# Patient Record
Sex: Female | Born: 1950 | Race: White | Hispanic: No | Marital: Married | Smoking: Never smoker
Health system: Southern US, Community
[De-identification: ages and names within clinical notes are randomized; demographics above are authoritative.]

## PROBLEM LIST (undated history)

## (undated) DIAGNOSIS — L819 Disorder of pigmentation, unspecified: Secondary | ICD-10-CM

## (undated) DIAGNOSIS — I499 Cardiac arrhythmia, unspecified: Secondary | ICD-10-CM

## (undated) DIAGNOSIS — Z807 Family history of other malignant neoplasms of lymphoid, hematopoietic and related tissues: Secondary | ICD-10-CM

## (undated) DIAGNOSIS — F419 Anxiety disorder, unspecified: Secondary | ICD-10-CM

## (undated) DIAGNOSIS — Z803 Family history of malignant neoplasm of breast: Secondary | ICD-10-CM

## (undated) DIAGNOSIS — Z808 Family history of malignant neoplasm of other organs or systems: Secondary | ICD-10-CM

## (undated) DIAGNOSIS — Z801 Family history of malignant neoplasm of trachea, bronchus and lung: Secondary | ICD-10-CM

## (undated) DIAGNOSIS — K219 Gastro-esophageal reflux disease without esophagitis: Secondary | ICD-10-CM

## (undated) DIAGNOSIS — E039 Hypothyroidism, unspecified: Secondary | ICD-10-CM

## (undated) DIAGNOSIS — M1990 Unspecified osteoarthritis, unspecified site: Secondary | ICD-10-CM

## (undated) DIAGNOSIS — C801 Malignant (primary) neoplasm, unspecified: Secondary | ICD-10-CM

## (undated) DIAGNOSIS — R05 Cough: Secondary | ICD-10-CM

## (undated) DIAGNOSIS — C50919 Malignant neoplasm of unspecified site of unspecified female breast: Secondary | ICD-10-CM

## (undated) DIAGNOSIS — I10 Essential (primary) hypertension: Secondary | ICD-10-CM

## (undated) DIAGNOSIS — Z923 Personal history of irradiation: Secondary | ICD-10-CM

## (undated) HISTORY — DX: Disorder of pigmentation, unspecified: L81.9

## (undated) HISTORY — DX: Gastro-esophageal reflux disease without esophagitis: K21.9

## (undated) HISTORY — DX: Family history of malignant neoplasm of breast: Z80.3

## (undated) HISTORY — DX: Unspecified osteoarthritis, unspecified site: M19.90

## (undated) HISTORY — PX: COLONOSCOPY: SHX174

## (undated) HISTORY — DX: Family history of malignant neoplasm of other organs or systems: Z80.8

## (undated) HISTORY — DX: Hypothyroidism, unspecified: E03.9

## (undated) HISTORY — DX: Family history of malignant neoplasm of trachea, bronchus and lung: Z80.1

## (undated) HISTORY — PX: BREAST EXCISIONAL BIOPSY: SUR124

## (undated) HISTORY — DX: Cough: R05

## (undated) HISTORY — DX: Family history of other malignant neoplasms of lymphoid, hematopoietic and related tissues: Z80.7

## (undated) HISTORY — DX: Essential (primary) hypertension: I10

## (undated) HISTORY — PX: CHOLECYSTECTOMY: SHX55

---

## 1998-10-17 ENCOUNTER — Ambulatory Visit (HOSPITAL_COMMUNITY): Admission: RE | Admit: 1998-10-17 | Discharge: 1998-10-17 | Payer: Self-pay | Admitting: *Deleted

## 1998-10-17 ENCOUNTER — Encounter: Payer: Self-pay | Admitting: *Deleted

## 1998-11-26 ENCOUNTER — Other Ambulatory Visit: Admission: RE | Admit: 1998-11-26 | Discharge: 1998-11-26 | Payer: Self-pay | Admitting: *Deleted

## 1999-12-10 ENCOUNTER — Encounter: Payer: Self-pay | Admitting: *Deleted

## 1999-12-10 ENCOUNTER — Ambulatory Visit (HOSPITAL_COMMUNITY): Admission: RE | Admit: 1999-12-10 | Discharge: 1999-12-10 | Payer: Self-pay | Admitting: *Deleted

## 2001-02-04 ENCOUNTER — Other Ambulatory Visit: Admission: RE | Admit: 2001-02-04 | Discharge: 2001-02-04 | Payer: Self-pay | Admitting: *Deleted

## 2001-07-22 ENCOUNTER — Ambulatory Visit (HOSPITAL_COMMUNITY): Admission: RE | Admit: 2001-07-22 | Discharge: 2001-07-22 | Payer: Self-pay | Admitting: *Deleted

## 2001-07-22 ENCOUNTER — Encounter: Payer: Self-pay | Admitting: *Deleted

## 2002-09-27 ENCOUNTER — Other Ambulatory Visit: Admission: RE | Admit: 2002-09-27 | Discharge: 2002-09-27 | Payer: Self-pay | Admitting: Obstetrics and Gynecology

## 2003-12-01 ENCOUNTER — Ambulatory Visit (HOSPITAL_COMMUNITY): Admission: RE | Admit: 2003-12-01 | Discharge: 2003-12-01 | Payer: Self-pay | Admitting: Family Medicine

## 2005-01-02 ENCOUNTER — Ambulatory Visit (HOSPITAL_COMMUNITY): Admission: RE | Admit: 2005-01-02 | Discharge: 2005-01-02 | Payer: Self-pay | Admitting: Obstetrics & Gynecology

## 2006-02-12 ENCOUNTER — Ambulatory Visit (HOSPITAL_COMMUNITY): Admission: RE | Admit: 2006-02-12 | Discharge: 2006-02-12 | Payer: Self-pay | Admitting: Family Medicine

## 2006-11-24 HISTORY — PX: JOINT REPLACEMENT: SHX530

## 2007-03-08 ENCOUNTER — Ambulatory Visit (HOSPITAL_COMMUNITY): Admission: RE | Admit: 2007-03-08 | Discharge: 2007-03-08 | Payer: Self-pay | Admitting: Family Medicine

## 2007-03-12 ENCOUNTER — Encounter: Admission: RE | Admit: 2007-03-12 | Discharge: 2007-03-12 | Payer: Self-pay | Admitting: Family Medicine

## 2007-08-26 ENCOUNTER — Ambulatory Visit (HOSPITAL_COMMUNITY): Admission: RE | Admit: 2007-08-26 | Discharge: 2007-08-26 | Payer: Self-pay | Admitting: Family Medicine

## 2007-09-27 ENCOUNTER — Inpatient Hospital Stay (HOSPITAL_COMMUNITY): Admission: RE | Admit: 2007-09-27 | Discharge: 2007-09-30 | Payer: Self-pay | Admitting: Orthopedic Surgery

## 2007-11-25 DIAGNOSIS — Z923 Personal history of irradiation: Secondary | ICD-10-CM

## 2007-11-25 DIAGNOSIS — C50919 Malignant neoplasm of unspecified site of unspecified female breast: Secondary | ICD-10-CM

## 2007-11-25 HISTORY — PX: BREAST LUMPECTOMY: SHX2

## 2007-11-25 HISTORY — PX: HERNIA REPAIR: SHX51

## 2007-11-25 HISTORY — PX: BREAST SURGERY: SHX581

## 2007-11-25 HISTORY — DX: Malignant neoplasm of unspecified site of unspecified female breast: C50.919

## 2007-11-25 HISTORY — DX: Personal history of irradiation: Z92.3

## 2008-03-31 ENCOUNTER — Ambulatory Visit (HOSPITAL_COMMUNITY): Admission: RE | Admit: 2008-03-31 | Discharge: 2008-03-31 | Payer: Self-pay | Admitting: Family Medicine

## 2008-04-11 ENCOUNTER — Encounter: Admission: RE | Admit: 2008-04-11 | Discharge: 2008-04-11 | Payer: Self-pay | Admitting: Family Medicine

## 2008-04-13 ENCOUNTER — Encounter (INDEPENDENT_AMBULATORY_CARE_PROVIDER_SITE_OTHER): Payer: Self-pay | Admitting: Diagnostic Radiology

## 2008-04-13 ENCOUNTER — Encounter: Admission: RE | Admit: 2008-04-13 | Discharge: 2008-04-13 | Payer: Self-pay | Admitting: Family Medicine

## 2008-04-24 ENCOUNTER — Encounter: Admission: RE | Admit: 2008-04-24 | Discharge: 2008-04-24 | Payer: Self-pay | Admitting: Family Medicine

## 2008-05-15 ENCOUNTER — Encounter (INDEPENDENT_AMBULATORY_CARE_PROVIDER_SITE_OTHER): Payer: Self-pay | Admitting: Surgery

## 2008-05-15 ENCOUNTER — Encounter: Admission: RE | Admit: 2008-05-15 | Discharge: 2008-05-15 | Payer: Self-pay | Admitting: Surgery

## 2008-05-15 ENCOUNTER — Ambulatory Visit (HOSPITAL_BASED_OUTPATIENT_CLINIC_OR_DEPARTMENT_OTHER): Admission: RE | Admit: 2008-05-15 | Discharge: 2008-05-15 | Payer: Self-pay | Admitting: Surgery

## 2008-05-17 ENCOUNTER — Ambulatory Visit: Payer: Self-pay | Admitting: Oncology

## 2008-05-24 ENCOUNTER — Ambulatory Visit: Admission: RE | Admit: 2008-05-24 | Discharge: 2008-07-30 | Payer: Self-pay | Admitting: Radiation Oncology

## 2008-06-12 ENCOUNTER — Encounter: Admission: RE | Admit: 2008-06-12 | Discharge: 2008-06-12 | Payer: Self-pay | Admitting: Radiation Oncology

## 2008-09-26 ENCOUNTER — Ambulatory Visit: Payer: Self-pay | Admitting: Oncology

## 2008-09-29 LAB — FOLLICLE STIMULATING HORMONE: FSH: 41.1 m[IU]/mL

## 2008-10-07 LAB — ESTRADIOL, ULTRA SENS

## 2008-11-07 ENCOUNTER — Ambulatory Visit (HOSPITAL_COMMUNITY): Admission: RE | Admit: 2008-11-07 | Discharge: 2008-11-08 | Payer: Self-pay | Admitting: General Surgery

## 2009-01-09 ENCOUNTER — Encounter: Payer: Self-pay | Admitting: Family Medicine

## 2009-04-16 ENCOUNTER — Ambulatory Visit: Payer: Self-pay | Admitting: Family Medicine

## 2009-04-16 DIAGNOSIS — E039 Hypothyroidism, unspecified: Secondary | ICD-10-CM

## 2009-04-16 DIAGNOSIS — I10 Essential (primary) hypertension: Secondary | ICD-10-CM

## 2009-04-16 DIAGNOSIS — M1990 Unspecified osteoarthritis, unspecified site: Secondary | ICD-10-CM | POA: Insufficient documentation

## 2009-04-16 HISTORY — DX: Unspecified osteoarthritis, unspecified site: M19.90

## 2009-04-16 HISTORY — DX: Hypothyroidism, unspecified: E03.9

## 2009-04-16 HISTORY — DX: Essential (primary) hypertension: I10

## 2009-04-19 ENCOUNTER — Encounter: Admission: RE | Admit: 2009-04-19 | Discharge: 2009-04-19 | Payer: Self-pay | Admitting: Family Medicine

## 2009-05-24 ENCOUNTER — Ambulatory Visit: Payer: Self-pay | Admitting: Family Medicine

## 2009-05-24 LAB — CONVERTED CEMR LAB
ALT: 22 units/L (ref 0–35)
AST: 20 units/L (ref 0–37)
Albumin: 3.6 g/dL (ref 3.5–5.2)
Alkaline Phosphatase: 62 units/L (ref 39–117)
Basophils Absolute: 0 10*3/uL (ref 0.0–0.1)
Basophils Relative: 0.5 % (ref 0.0–3.0)
Bilirubin Urine: NEGATIVE
Bilirubin, Direct: 0.1 mg/dL (ref 0.0–0.3)
CO2: 31 meq/L (ref 19–32)
Calcium: 9.1 mg/dL (ref 8.4–10.5)
Cholesterol: 213 mg/dL — ABNORMAL HIGH (ref 0–200)
Creatinine, Ser: 0.7 mg/dL (ref 0.4–1.2)
Direct LDL: 149.4 mg/dL
Eosinophils Absolute: 0.3 10*3/uL (ref 0.0–0.7)
Eosinophils Relative: 6 % — ABNORMAL HIGH (ref 0.0–5.0)
Glucose, Bld: 98 mg/dL (ref 70–99)
Glucose, Urine, Semiquant: NEGATIVE
Ketones, urine, test strip: NEGATIVE
Lymphocytes Relative: 34.5 % (ref 12.0–46.0)
Lymphs Abs: 1.5 10*3/uL (ref 0.7–4.0)
Monocytes Absolute: 0.4 10*3/uL (ref 0.1–1.0)
Neutro Abs: 2.1 10*3/uL (ref 1.4–7.7)
Nitrite: NEGATIVE
Total CHOL/HDL Ratio: 5
Urobilinogen, UA: 0.2
VLDL: 21.6 mg/dL (ref 0.0–40.0)
WBC Urine, dipstick: NEGATIVE

## 2009-05-31 ENCOUNTER — Ambulatory Visit: Payer: Self-pay | Admitting: Family Medicine

## 2009-08-20 ENCOUNTER — Encounter: Payer: Self-pay | Admitting: Family Medicine

## 2009-08-23 ENCOUNTER — Ambulatory Visit: Payer: Self-pay | Admitting: Oncology

## 2009-08-27 ENCOUNTER — Encounter (INDEPENDENT_AMBULATORY_CARE_PROVIDER_SITE_OTHER): Payer: Self-pay | Admitting: *Deleted

## 2010-04-30 ENCOUNTER — Encounter: Admission: RE | Admit: 2010-04-30 | Discharge: 2010-04-30 | Payer: Self-pay | Admitting: Family Medicine

## 2010-05-10 ENCOUNTER — Ambulatory Visit: Payer: Self-pay | Admitting: Family Medicine

## 2010-05-10 DIAGNOSIS — R05 Cough: Secondary | ICD-10-CM

## 2010-05-10 DIAGNOSIS — R059 Cough, unspecified: Secondary | ICD-10-CM | POA: Insufficient documentation

## 2010-05-10 HISTORY — DX: Cough, unspecified: R05.9

## 2010-05-24 ENCOUNTER — Ambulatory Visit: Payer: Self-pay | Admitting: Family Medicine

## 2010-05-24 LAB — CONVERTED CEMR LAB
AST: 19 units/L (ref 0–37)
Albumin: 4 g/dL (ref 3.5–5.2)
BUN: 13 mg/dL (ref 6–23)
Basophils Relative: 0.7 % (ref 0.0–3.0)
Bilirubin, Direct: 0.1 mg/dL (ref 0.0–0.3)
Cholesterol: 230 mg/dL — ABNORMAL HIGH (ref 0–200)
Direct LDL: 147.4 mg/dL
Glucose, Bld: 86 mg/dL (ref 70–99)
HCT: 39.5 % (ref 36.0–46.0)
HDL: 51.2 mg/dL (ref >39.00)
Lymphocytes Relative: 38.5 % (ref 12.0–46.0)
Lymphs Abs: 1.5 10*3/uL (ref 0.7–4.0)
Monocytes Absolute: 0.4 10*3/uL (ref 0.1–1.0)
Neutro Abs: 1.9 10*3/uL (ref 1.4–7.7)
Neutrophils Relative %: 47.2 % (ref 43.0–77.0)
RBC: 4.33 M/uL (ref 3.87–5.11)
RDW: 13.4 % (ref 11.5–14.6)
Total Bilirubin: 0.8 mg/dL (ref 0.3–1.2)
Total CHOL/HDL Ratio: 4
Total Protein: 7.2 g/dL (ref 6.0–8.3)
Triglycerides: 145 mg/dL (ref 0.0–149.0)
Urobilinogen, UA: 0.2
VLDL: 29 mg/dL (ref 0.0–40.0)
WBC: 3.9 10*3/uL — ABNORMAL LOW (ref 4.5–10.5)
pH: 6

## 2010-06-07 ENCOUNTER — Ambulatory Visit: Payer: Self-pay | Admitting: Family Medicine

## 2010-06-07 DIAGNOSIS — J029 Acute pharyngitis, unspecified: Secondary | ICD-10-CM | POA: Insufficient documentation

## 2010-06-07 DIAGNOSIS — J020 Streptococcal pharyngitis: Secondary | ICD-10-CM | POA: Insufficient documentation

## 2010-06-10 ENCOUNTER — Ambulatory Visit: Payer: Self-pay | Admitting: Family Medicine

## 2010-06-10 ENCOUNTER — Other Ambulatory Visit: Admission: RE | Admit: 2010-06-10 | Discharge: 2010-06-10 | Payer: Self-pay | Admitting: Family Medicine

## 2010-06-10 DIAGNOSIS — L819 Disorder of pigmentation, unspecified: Secondary | ICD-10-CM

## 2010-06-10 HISTORY — DX: Disorder of pigmentation, unspecified: L81.9

## 2010-06-12 LAB — CONVERTED CEMR LAB: Pap Smear: NEGATIVE

## 2010-06-19 ENCOUNTER — Telehealth: Payer: Self-pay | Admitting: *Deleted

## 2010-07-08 ENCOUNTER — Telehealth: Payer: Self-pay | Admitting: *Deleted

## 2010-07-09 ENCOUNTER — Ambulatory Visit: Payer: Self-pay | Admitting: Family Medicine

## 2010-09-11 ENCOUNTER — Ambulatory Visit: Payer: Self-pay | Admitting: Family Medicine

## 2010-12-15 ENCOUNTER — Encounter: Payer: Self-pay | Admitting: Family Medicine

## 2010-12-24 NOTE — Progress Notes (Signed)
Summary: PA for Retin A denied  Phone Note Outgoing Call   Call placed by: Sid Falcon LPN,  June 19, 2010 4:53 PM Call placed to: Patient Summary of Call: Pt informed BC/BS deined prior authorization for Retin A cream.  She will be mailed the same information.  Pt may contact BC/BS.  She has not had the Rx filled for this med. Initial call taken by: Sid Falcon LPN,  June 19, 2010 5:03 PM    New/Updated Medications: RETIN-A 0.05 % CREA (TRETINOIN) apply to face at bedtime as directed

## 2010-12-24 NOTE — Assessment & Plan Note (Signed)
Summary: FLU SHOT // RS  Nurse Visit   Allergies: 1)  ! Codeine Sulfate (Codeine Sulfate) 2)  ! Erythromycin Base (Erythromycin Base) 3)  Nexium (Esomeprazole Magnesium)  Orders Added: 1)  Admin 1st Vaccine [90471] 2)  Flu Vaccine 64yrs + [47829] Flu Vaccine Consent Questions     Do you have a history of severe allergic reactions to this vaccine? no    Any prior history of allergic reactions to egg and/or gelatin? no    Do you have a sensitivity to the preservative Thimersol? no    Do you have a past history of Guillan-Barre Syndrome? no    Do you currently have an acute febrile illness? no    Have you ever had a severe reaction to latex? no    Vaccine information given and explained to patient? yes    Are you currently pregnant? no    Lot Number:[id number]   Exp Date:05/24/2011   Site Given  Left Deltoid IM

## 2010-12-24 NOTE — Miscellaneous (Signed)
Summary: Waiver of Liability Form for Shingles Vaccine  Waiver of Liability Form for Shingles Vaccine   Imported By: Maryln Gottron 07/11/2010 10:53:29  _____________________________________________________________________  External Attachment:    Type:   Image     Comment:   External Document

## 2010-12-24 NOTE — Progress Notes (Signed)
Summary: Zostavax offered  Phone Note Outgoing Call   Call placed by: Sid Falcon LPN,  July 08, 2010 9:04 AM Call placed to: OPatient Summary of Call: Offered Zostavax, pt would like to have one, will schedule Initial call taken by: Sid Falcon LPN,  July 08, 2010 9:12 AM

## 2010-12-24 NOTE — Assessment & Plan Note (Signed)
Summary: SORE THROAT // RS   Vital Signs:  Patient profile:   60 year old female Height:      63.75 inches Weight:      168 pounds BMI:     29.17 Temp:     98.2 degrees F oral BP sitting:   160 / 88  (left arm) Cuff size:   regular  Vitals Entered By: Kern Reap CMA Duncan Dull) (June 07, 2010 11:51 AM) CC: sore throat   CC:  sore throat.  History of Present Illness: Theresa Bradley is a 60-year-old female comes in today for evaluation of sore throat x 2 days.  To her family members have had strep.  This week.  She has fever bad sore throat and cough.  Review of systems negative.  She is not allergic to erythromycin.  It causes her to have an upset stomach  Allergies: 1)  ! Codeine Sulfate (Codeine Sulfate) 2)  ! Erythromycin Base (Erythromycin Base)  Past History:  Past medical, surgical, family and social histories (including risk factors) reviewed for relevance to current acute and chronic problems.  Past Medical History: Reviewed history from 04/16/2009 and no changes required. Anxiety Hypothyroidism Osteoarthritis hips Hypertension right breast cancer ductal in situ Jaundice (5th grade) UTI  Past Surgical History: Reviewed history from 04/16/2009 and no changes required. Cholecystectomy breast biopsy X 2  Breast Cancer 2009, stage 0, non invasive (lumpectomy) Left hip replacement 2008 Hernia repair 2009 2 C-Section 1979, 1982  Family History: Reviewed history from 04/16/2009 and no changes required. Family History of Arthritis Family History High cholesterol Breast cancer, relative  Social History: Reviewed history from 04/16/2009 and no changes required. Occupation: Veterinary surgeon Married Never Smoked  Review of Systems      See HPI  Physical Exam  General:  Well-developed,well-nourished,in no acute distress; alert,appropriate and cooperative throughout examination Head:  Normocephalic and atraumatic without obvious abnormalities. No apparent alopecia or  balding. Eyes:  No corneal or conjunctival inflammation noted. EOMI. Perrla. Funduscopic exam benign, without hemorrhages, exudates or papilledema. Vision grossly normal. Ears:  External ear exam shows no significant lesions or deformities.  Otoscopic examination reveals clear canals, tympanic membranes are intact bilaterally without bulging, retraction, inflammation or discharge. Hearing is grossly normal bilaterally. Nose:  External nasal examination shows no deformity or inflammation. Nasal mucosa are pink and moist without lesions or exudates. Mouth:  red pharynx no exudates Neck:  No deformities, masses, or tenderness noted. Lungs:  Normal respiratory effort, chest expands symmetrically. Lungs are clear to auscultation, no crackles or wheezes.   Problems:  Medical Problems Added: 1)  Dx of Strep Throat  (ICD-034.0) 2)  Dx of Sore Throat  (ICD-462)  Impression & Recommendations:  Problem # 1:  STREP THROAT (ICD-034.0) Assessment New  Her updated medication list for this problem includes:    Mobic 15 Mg Tabs (Meloxicam) ..... Once daily    Aspirin Adult Bradley Strength 81 Mg Tbec (Aspirin) ..... Once daily    Amoxicillin 500 Mg Caps (Amoxicillin) .Marland Kitchen... Take 1 tablet by mouth three times a day  Orders: Prescription Created Electronically [phone])  Complete Medication List: 1)  Mobic 15 Mg Tabs (Meloxicam) .... Once daily 2)  Levoxyl 50 Mcg Tabs (Levothyroxine sodium) .... Once daily 3)  Xanax 0.5 Mg Tabs (Alprazolam) .... As needed 4)  Aspirin Adult Bradley Strength 81 Mg Tbec (Aspirin) .... Once daily 5)  Claritin 10 Mg Caps (Loratadine) .... Once daily 6)  Celexa 20 Mg Tabs (Citalopram hydrobromide) .... One by mouth once  daily 7)  [facility] Calcium-vitamin D 500-200 Mg-unit Tabs (Calcium carbonate-vitamin d) .... Once daily 8)  Amoxicillin 500 Mg Caps (Amoxicillin) .... Take 1 tablet by mouth three times a day  Other Orders: Rapid Strep (04540)  Patient Instructions: 1)  begin  amoxicillin, 500 mg 3 times a day x 10 days 2)  Please schedule a follow-up appointment as needed. Prescriptions: AMOXICILLIN 500 MG CAPS (AMOXICILLIN) Take 1 tablet by mouth three times a day  #30 x 1   Entered and Authorized by:   Roderick Pee MD   Signed by:   Roderick Pee MD on 06/07/2010   Method used:   [facility] to        [facility]  [phone]* (retail)       [address] [place], [place]  [address]       Ph: [phone] or [phone]       Fax: [phone]   RxID:   [id number]   Laboratory Results  Date/Time Received: June 07, 2010   Other Tests  Rapid Strep: negative Comments: Kern Reap CMA Duncan Dull)  June 07, 2010 11:53 AM

## 2010-12-24 NOTE — Assessment & Plan Note (Signed)
Summary: SHINGLES VACCINATION // RS  Nurse Visit   Allergies: 1)  ! Codeine Sulfate (Codeine Sulfate) 2)  ! Erythromycin Base (Erythromycin Base) 3)  Nexium (Esomeprazole Magnesium)  Immunizations Administered:  Zostavax # 1:    Vaccine Type: Zostavax    Site: left deltoid    Mfr: Merck    Dose: 0.5 ml    Route: Tracy    Given by: Sid Falcon LPN    Exp. Date: 07/06/2011    Lot #: [id number]    VIS given: 09/05/05 given July 09, 2010.  Orders Added: 1)  Zoster (Shingles) Vaccine Live [90736] 2)  Admin 1st Vaccine [phone]

## 2010-12-24 NOTE — Assessment & Plan Note (Signed)
Summary: ?uri/njr   Vital Signs:  Patient profile:   60 year old female Temp:     98.0 degrees F oral BP sitting:   140 / 80  (left arm) Cuff size:   regular  Vitals Entered By: Sid Falcon LPN (May 10, 2010 2:06 PM) CC: URI X 3 weeks   History of Present Illness: Patient seen with 3 week history of upper respiratory symptoms. Frequent nasal congestion. Seen in urgent care 3 weeks ago and treated with amoxicillin. Rapid strep negative. At this point no colored nasal discharge. Mostly has chronic cough for several weeks. Questionable reflux symptoms. Frequent use of mentholated cough drops. Also takes low-dose lisinopril but ran out 3 weeks ago but cough is unchanged. Intermittent laryngitis. Overall postnasal drainage improved. Denies any fever or chills. Nonsmoker. Denies pleuritic pain or hemoptysis. No dyspnea.  Allergies: 1)  ! Codeine Sulfate (Codeine Sulfate) 2)  ! Erythromycin Base (Erythromycin Base)  Past History:  Past Medical History: Last updated: 04/16/2009 Anxiety Hypothyroidism Osteoarthritis hips Hypertension right breast cancer ductal in situ Jaundice (5th grade) UTI  Review of Systems       The patient complains of prolonged cough.  The patient denies anorexia, fever, weight loss, chest pain, syncope, dyspnea on exertion, peripheral edema, hemoptysis, and enlarged lymph nodes.    Physical Exam  General:  Well-developed,well-nourished,in no acute distress; alert,appropriate and cooperative throughout examination Ears:  External ear exam shows no significant lesions or deformities.  Otoscopic examination reveals clear canals, tympanic membranes are intact bilaterally without bulging, retraction, inflammation or discharge. Hearing is grossly normal bilaterally. Nose:  External nasal examination shows no deformity or inflammation. Nasal mucosa are pink and moist without lesions or exudates. Mouth:  Oral mucosa and oropharynx without lesions or exudates.   Teeth in good repair. Neck:  No deformities, masses, or tenderness noted. Lungs:  Normal respiratory effort, chest expands symmetrically. Lungs are clear to auscultation, no crackles or wheezes. Heart:  Normal rate and regular rhythm. S1 and S2 normal without gallop, murmur, click, rub or other extra sounds. Extremities:  no edema or clubbing. Cervical Nodes:  No lymphadenopathy noted Psych:  normally interactive, good eye contact, not anxious appearing, and not depressed appearing.     Impression & Recommendations:  Problem # 1:  COUGH, CHRONIC (ICD-786.2) Assessment New contributing factors may include ACE inhibitor, GERD, mentholated cough drops which may be exacerbating gerd. Stop cough drops and any other peppermint, spearmint, or mentholated products.  Elev. head of bed 6-8 inches. Nexium 40 mg by mouth once daily with samples given.  Hold lisinopril and monitor BP. CPE in one month.  IF still cough at that point CXR, spirometry and consider pulm referral.  Problem # 2:  HYPERTENSION (ICD-401.9) stable off lisinopril.  monitor. Her updated medication list for this problem includes:    Lisinopril-hydrochlorothiazide 20-12.5 Mg Tabs (Lisinopril-hydrochlorothiazide) .Marland Kitchen... 1/4 tab by mouth once daily  Complete Medication List: 1)  Mobic 15 Mg Tabs (Meloxicam) .... Once daily 2)  Levoxyl 50 Mcg Tabs (Levothyroxine sodium) .... Once daily 3)  Lisinopril-hydrochlorothiazide 20-12.5 Mg Tabs (Lisinopril-hydrochlorothiazide) .... 1/4 tab by mouth once daily 4)  Xanax 0.5 Mg Tabs (Alprazolam) .... As needed 5)  Aspirin Adult Low Strength 81 Mg Tbec (Aspirin) .... Once daily 6)  Claritin 10 Mg Caps (Loratadine) .... Once daily 7)  Celexa 20 Mg Tabs (Citalopram hydrobromide) .... One by mouth once daily 8)  [facility] Calcium-vitamin D 500-200 Mg-unit Tabs (Calcium carbonate-vitamin d) .... Once daily  Patient  Instructions: 1)  Continue to hold lisinopril 2)  Start Nexium 40 mg one tablet  daily 3)  Elevate head of bed 6-8 inches 4)  Avoid mentholated cough drops or any spearment or peppermint products.

## 2010-12-24 NOTE — Assessment & Plan Note (Signed)
Summary: cpx/pap/cjr rsc bmp/njr   Vital Signs:  Patient profile:   60 year old female Height:      64 inches Weight:      165 pounds Temp:     98 degrees F oral Resp:     12 per minute BP sitting:   150 / 84  (left arm) Cuff size:   regular  Vitals Entered By: Sid Falcon LPN (June 10, 2010 9:09 AM)  Serial Vital Signs/Assessments:  Time      Position  BP       Pulse  Resp  Temp     By                     132/80                         Evelena Peat MD   History of Present Illness: Here for CPE.  PMH, SH, AND FH reviewed. Hx breast cancer 2009 with lumpectomy.  Mammogram 6/11 normal. Colonoscopy 2007 and told will need 10 yr f/u. Last pap 2009 and normal.  No hx of abnl pap and low risk for abnormality.  Pt needs Tdap.  She is interested in Zostavax.  Pt has allergic rhinitis hx and requests refills Flonase. Also requests Retin A for "age spots".  She has apparently had friends who have used for this.  Clinical Review Panels:  Prevention   Last Mammogram:  BI-RADS CATEGORY 1:  Negative.^MM DIGITAL DIAGNOSTIC BILAT (04/30/2010)   Last Pap Smear:  normal (03/24/2008)   Last Colonoscopy:  Adenomatous Polyp (01/12/2006)  Immunizations   Last Tetanus Booster:  Tdap (06/10/2010)   Last Pneumovax:  Historical (11/24/2006)  Lipid Management   Cholesterol:  230 (05/24/2010)   HDL (good cholesterol):  51.20 (05/24/2010)  Diabetes Management   Creatinine:  0.7 (05/24/2010)   Last Pneumovax:  Historical (11/24/2006)   Allergies: 1)  ! Codeine Sulfate (Codeine Sulfate) 2)  ! Erythromycin Base (Erythromycin Base) 3)  Nexium (Esomeprazole Magnesium)  Past History:  Past Surgical History: Last updated: 04/16/2009 Cholecystectomy breast biopsy X 2  Breast Cancer 2009, stage 0, non invasive (lumpectomy) Left hip replacement 2008 Hernia repair 2009 2 C-Section 1979, 1982  Family History: Last updated: 06/10/2010 Family History of Arthritis Family History  High cholesterol Breast cancer, relative 2 aunts  Social History: Last updated: 04/16/2009 Occupation: Realtor Married Never Smoked  Risk Factors: Smoking Status: never (04/16/2009)  Past Medical History: Anxiety Hypothyroidism Osteoarthritis hips Hypertension right breast cancer ductal in situ 2009 Jaundice (5th grade) UTI PMH-FH-SH reviewed for relevance  Family History: Family History of Arthritis Family History High cholesterol Breast cancer, relative 2 aunts  Review of Systems  The patient denies anorexia, fever, weight loss, weight gain, vision loss, decreased hearing, hoarseness, chest pain, syncope, dyspnea on exertion, peripheral edema, prolonged cough, headaches, hemoptysis, abdominal pain, melena, hematochezia, severe indigestion/heartburn, hematuria, incontinence, genital sores, muscle weakness, suspicious skin lesions, transient blindness, difficulty walking, depression, unusual weight change, abnormal bleeding, enlarged lymph nodes, and breast masses.    Physical Exam  General:  Well-developed,well-nourished,in no acute distress; alert,appropriate and cooperative throughout examination Head:  Normocephalic and atraumatic without obvious abnormalities. No apparent alopecia or balding. Eyes:  No corneal or conjunctival inflammation noted. EOMI. Perrla. Funduscopic exam benign, without hemorrhages, exudates or papilledema. Vision grossly normal. Ears:  External ear exam shows no significant lesions or deformities.  Otoscopic examination reveals clear canals, tympanic membranes are intact bilaterally without  bulging, retraction, inflammation or discharge. Hearing is grossly normal bilaterally. Mouth:  Oral mucosa and oropharynx without lesions or exudates.  Teeth in good repair. Neck:  No deformities, masses, or tenderness noted. Breasts:  No mass, nodules, thickening, tenderness, bulging, retraction, inflamation, nipple discharge or skin changes noted.   Lungs:   Normal respiratory effort, chest expands symmetrically. Lungs are clear to auscultation, no crackles or wheezes. Heart:  Normal rate and regular rhythm. S1 and S2 normal without gallop, murmur, click, rub or other extra sounds. Abdomen:  Bowel sounds positive,abdomen soft and non-tender without masses, organomegaly or hernias noted. Genitalia:  Normal introitus for age, no external lesions, no vaginal discharge, mucosa pink and moist, no vaginal or cervical lesions, no vaginal atrophy, no friaility or hemorrhage, normal uterus size and position, no adnexal masses or tenderness Msk:  No deformity or scoliosis noted of thoracic or lumbar spine.   Extremities:  No clubbing, cyanosis, edema, or deformity noted with normal full range of motion of all joints.   Neurologic:  alert & oriented X3, cranial nerves II-XII intact, and strength normal in all extremities.   Skin:  Faint hyperpigmented solar lentigo on face. Cervical Nodes:  No lymphadenopathy noted Psych:  normally interactive, good eye contact, not anxious appearing, and not depressed appearing.     Impression & Recommendations:  Problem # 1:  ROUTINE GENERAL MEDICAL [email or web address]@[email or web address]  CARE FACL (ICD-V70.0) labs reviewed.  Establish regular exercise.  Work on weight loss.  Tdap given. Pap obtained.  Problem # 2:  SENILE LENTIGO (ICD-709.09) pt requests trial of Retin A.  She is aware insurance may not cover.  Complete Medication List: 1)  Mobic 15 Mg Tabs (Meloxicam) .... Once daily 2)  Levoxyl 50 Mcg Tabs (Levothyroxine sodium) .... Once daily 3)  Xanax 0.5 Mg Tabs (Alprazolam) .... As needed 4)  Aspirin Adult Low Strength 81 Mg Tbec (Aspirin) .... Once daily 5)  Claritin 10 Mg Caps (Loratadine) .... Once daily 6)  Celexa 20 Mg Tabs (Citalopram hydrobromide) .... One by mouth once daily 7)  [facility] Calcium-vitamin D 500-200 Mg-unit Tabs (Calcium carbonate-vitamin d) .... Once daily 8)  Amoxicillin 500 Mg Caps (Amoxicillin) .... Take 1 tablet by  mouth three times a day 9)  Fluticasone Propionate 50 Mcg/act Susp (Fluticasone propionate) .... 2 sprays per nostril once daily 10)  Retin-a 0.05 % Crea (Tretinoin) .... Apply to face at bedtime as directed  Other Orders: Tdap => 57yrs IM (16109) Admin 1st Vaccine (60454)  Patient Instructions: 1)  It is important that you exercise reguarly at least 20 minutes 5 times a week. If you develop chest pain, have severe difficulty breathing, or feel very tired, stop exercising immediately and seek medical attention.  2)  You need to lose weight. Consider a lower calorie diet and regular exercise.  3)  Check your  Blood Pressure regularly . If it is above: 140/90  you should make an appointment. Prescriptions: RETIN-A 0.05 % CREA (TRETINOIN) apply to face at bedtime as directed  #1 tube x 3   Entered and Authorized by:   Evelena Peat MD   Signed by:   Evelena Peat MD on 06/11/2010   Method used:   [facility]        [facility]  [phone]* (retail)       [address] [place], [place]  [address]       Ph: [phone] or [phone]       Fax:  [phone]   RxID:   [id number] FLUTICASONE PROPIONATE 50 MCG/ACT SUSP (FLUTICASONE PROPIONATE) 2 sprays per nostril once daily  #1 x 11   Entered and Authorized by:   Evelena Peat MD   Signed by:   Evelena Peat MD on 06/10/2010   Method used:   [facility] to        [facility]  [phone]* (retail)       [address] [place], [place]  [address]       Ph: [phone] or [phone]       Fax: [phone]   RxID:   [id number]      Immunizations Administered:  Tetanus Vaccine:    Vaccine Type: Tdap    Site: left deltoid    Mfr: GlaxoSmithKline    Dose: 0.5 ml    Route: IM    Given by: Sid Falcon LPN    Exp. Date: 12/13/2011    Lot #: [id number]

## 2011-02-11 ENCOUNTER — Other Ambulatory Visit: Payer: Self-pay

## 2011-02-11 NOTE — Telephone Encounter (Signed)
[facility] for [facility]razolam [facility] Last seen 05/2010 , med last rx'd 07/22/10 #[id number] 0RF  Please advise Harriett Sine

## 2011-02-12 MED ORDER — ALPRAZOLAM 0.5 MG PO TABS: 0.5000 mg | ORAL_TABLET | Freq: Every evening | ORAL | Status: DC | PRN

## 2011-02-12 NOTE — Telephone Encounter (Signed)
OK to refill once.   

## 2011-04-08 NOTE — H&P (Signed)
NAMEYOMARIS, PALECEK              ACCOUNT NO.:  [id number]   MEDICAL RECORD NO.:  [id number]          PATIENT TYPE:  INP   LOCATION:  NA                           FACILITY:  [facility]   PHYSICIAN:  Ollen Gross, M.D.    DATE OF BIRTH:  January 10, 1951   DATE OF ADMISSION:  DATE OF DISCHARGE:                              HISTORY & PHYSICAL   CHIEF COMPLAINT:  Left hip pain.   HISTORY OF PRESENT ILLNESS:  The patient is a 60 year old Bradley that  has had ongoing left hip pain.  She was seen as a second opinion back in  2007 with regards to her hip.  She has been noted to have end-stage  arthritis with some dysplasia.  It is felt that she would require hip  replacement.  She has had progressive symptoms and felt she has reached  a point where she would benefit from undergoing surgery.  Risks and  benefits have been discussed.  She elects to proceed with surgery.  She  has undergone some workup recently by Dr. Lady Deutscher and felt there is  no contraindications for up and coming surgery.   ALLERGIES:  CODEINE AND HYDROCODONE IN ANY FORM CAUSES VOMITING.   CURRENT MEDICATIONS:  Levoxyl, Celexa, Mobic, lisinopril, Xanax, baby  aspirin, allergy shots.   PAST MEDICAL HISTORY:  1. Anxiety.  2. History of bronchitis.  3. Mild hypertension.  4. History of hemorrhoids.  5. History of jaundice age 33.  6. Hypothyroidism.  7. Premenopausal.   PAST SURGICAL HISTORY:  1. C-sections x2.  2. Gallbladder surgery.  3. Two breast biopsies.   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  Two  children.  Husband will be assisting with care after surgery.   Rheumatoid arthritis with maternal grandfather and breast cancer with  maternal aunt.   REVIEW OF SYSTEMS:  GENERAL:  No fevers, chills, or night sweats.  NEUROLOGICAL:  No seizures, syncope or paralysis.  RESPIRATORY:  The  patient has had an intermittent cough.  She has been on a Z-Pak  recently.  No shortness of breath.   CARDIOVASCULAR:  No chest pain,  angina, orthopnea.  Occasional irregular heartbeat.  GASTROINTESTINAL:  No nausea, vomiting, diarrhea, constipation.  GENITOURINARY:  No  dysuria, hematuria or discharge.  MUSCULOSKELETAL:  Left hip.   PHYSICAL EXAMINATION:  VITAL SIGNS:  Pulse 68, respirations 12, blood  pressure 152/88.  GENERAL:  A 60 year old white Bradley, well-nourished, well-developed, no  acute distress.  She is alert, oriented, cooperative, very pleasant.  Good historian.  HEENT:  Normocephalic, atraumatic.  Pupils are round and reactive.  Noted to wear glasses.  EOMs intact.  NECK:  Supple.  CHEST:  Clear anterior and posterior chest walls.  No rhonchi, rales or  wheezing.  HEART:  Regular rate and rhythm.  No murmur.  ABDOMEN:  Soft, slightly round.  Bowel sounds present.  RECTAL, BREAST, GENITALIA:  Not done, not pertinent to present illness.  EXTREMITIES:  Left hip flexion 90, 0 internal rotation, 20 degrees  external rotation, 20 degrees abduction.   IMPRESSION:  1. Osteoarthritis, left hip, with  dysplasia.  2. Anxiety.  3. History of bronchitis.  4. Mild hypertension.  5. History of hemorrhoids.  6. History of jaundice, age 5.  7. Hypothyroidism.  8. Perimenopausal.   PLAN:  The patient admitted to [facility] to undergo a left  total replacement arthroplasty.  Surgery will be performed by Ollen Gross.      Alexzandrew L. Perkins, P.A.C.      Ollen Gross, M.D.  Electronically Signed    ALP/MEDQ  D:  09/22/2007  T:  09/22/2007  Job:  [id number]   cc:   Ollen Gross, M.D.  Fax: [phone]   Evelena Peat, M.D.   Rema Fendt, N.P.  [facility]   Elmore Guise., M.D.  Fax: [phone]

## 2011-04-08 NOTE — H&P (Signed)
Theresa Bradley, Theresa Bradley              ACCOUNT NO.:  [id number]   MEDICAL RECORD NO.:  [id number]          PATIENT TYPE:  OIB   LOCATION:  1527                         FACILITY:  [facility]   PHYSICIAN:  Adolph Pollack, M.D.DATE OF BIRTH:  07-05-1951   DATE OF ADMISSION:  11/07/2008  DATE OF DISCHARGE:                              HISTORY & PHYSICAL   REASON FOR ADMISSION:  Elective repair of ventral incisional hernia and  umbilical hernia.   HISTORY:  This is a 60 year old female who had open cholecystectomy many  years ago and started having discomfort from a hernia on the lateral  aspect of the scar.  Also has an umbilical hernia.  She is now admitted  for repair of both.   PAST MEDICAL HISTORY:  1. Hypertension.  2. Anxiety disorder.  3. Hypothyroidism.  4. Osteoarthritis.  5. History of bronchitis.   PREVIOUS ABDOMINAL OPERATIONS:  Cholecystectomy.   ALLERGIES/INTOLERANCES:  CODEINE, HYDROCODONE, TRAMADOL.   SOCIAL HISTORY:  She is married.  Has 1 to 2 alcoholic beverages a  month.  Denies tobacco use.   PHYSICAL EXAM:  GENERAL:  Well-developed, well-nourished female in no  acute distress, pleasant, cooperative.  VITAL SIGNS:  Temperature is 97.3, blood pressure is 120/72, pulse of  60, respiratory rate 16, O2 saturation 90% on room air.  EYES:  Extraocular.  No icterus.  NECK:  Supple without palpable masses.  RESPIRATORY:  Breath sounds equal and clear.  Respirations unlabored.  CARDIOVASCULAR:  Regular with no murmur.  ABDOMEN:  Soft with a right subcostal scar and a lateral bulge,  reducible.  Also periumbilical bulge that is reducible.  MUSCULOSKELETAL:  Good range of motion.  No edema.   IMPRESSION:  1. Ventral incisional hernia.  2. Umbilical hernia.   PLAN:  Laparoscopic repair of ventral incisional hernia and open repair  of umbilical hernia, both with mesh.  The procedure risks and aftercare  have been discussed with her preoperatively.      Adolph Pollack, M.D.  Electronically Signed     TJR/MEDQ  D:  11/07/2008  T:  11/07/2008  Job:  [id number]

## 2011-04-08 NOTE — Op Note (Signed)
Theresa Bradley, Theresa Bradley              ACCOUNT NO.:  [id number]   MEDICAL RECORD NO.:  [id number]          PATIENT TYPE:  AMB   LOCATION:  DSC                          FACILITY:  [facility]   PHYSICIAN:  Currie Paris, M.D.DATE OF BIRTH:  22-Mar-1951   DATE OF PROCEDURE:  05/15/2008  DATE OF DISCHARGE:                               OPERATIVE REPORT   PREOPERATIVE DIAGNOSIS:  Carcinoma, right breast upper outer quadrant,  clinical stage 0.   POSTOPERATIVE DIAGNOSIS:  Carcinoma, right breast upper outer quadrant,  clinical stage 0.   OPERATION:  Needle-guided right lumpectomy.   SURGEON:  Currie Paris, MD   ANESTHESIA:  General.   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  It is in the upper outer quadrant, right at the areolar margin, and  looked fairly superficial.   DESCRIPTION OF PROCEDURE:  The patient was seen in the holding area.  She had no further questions.  The right breast was marked as the  operative site.  I reviewed the mammogram films and the guidewire was  positioned and appear to enter laterally tracked medially directly over  the top of the areolar margin.   The patient was taken to the operating room and after satisfactory  general anesthesia had been obtained, the right breast was prepped and  draped as a sterile field.  The time-out was done.   I made a curvilinear incision at the areolar edge starting laterally  where the guidewire appeared to enter the skin and perhaps 1 mm or 2  from the guidewire entry and then around from that to about the 1  o'clock position.  I divided little bit of subcutaneous tissue and then  mobilized the guidewire into the wound.  I then raised some flaps  superiorly, inferiorly, subareolar, and then came down along side and  lateral to the guidewire and then took out the entire guidewire tract  and going a little bit  extra medially to be sure as well around the tip.  The bleeders were controlled with the cautery.  The specimen was  oriented for pathology and assess mammogram done.  That showed the clip  a little bit closer to the lateral edge, and I was happy with, so I went  back and took additional lateral breast tissue to include both the  superior, inferior, and little of the deep margins laterally.  This was  sent separately.   The wound was checked for hemostasis and irrigated and then closed in  layers with 3-0 Vicryl, 4-0 Monocryl, subcuticular, and Dermabond on the  skin.  Sterile dressings were applied.  The patient tolerated the  procedure well.  There were no complications.      Currie Paris, M.D.  Electronically Signed     CJS/MEDQ  D:  05/15/2008  T:  05/16/2008  Job:  [id number]   cc:   [facility]

## 2011-04-08 NOTE — Op Note (Signed)
Theresa Bradley, Theresa Bradley              ACCOUNT NO.:  [id number]   MEDICAL RECORD NO.:  [id number]          PATIENT TYPE:  OIB   LOCATION:  1527                         FACILITY:  [facility]   PHYSICIAN:  Adolph Pollack, M.D.DATE OF BIRTH:  [birth year]   DATE OF PROCEDURE:  11/07/2008  DATE OF DISCHARGE:  11/08/2008                               OPERATIVE REPORT   PREOPERATIVE DIAGNOSES:  1. Ventral incisional hernia.  2. Umbilical hernia.   POSTOPERATIVE DIAGNOSES:  1. Ventral incisional hernia.  2. Umbilical hernia.   PROCEDURE:  1. Laparoscopic ventral hernia repair with mesh (Parietex with      nonadherent barrier).  2. Umbilical hernia repair with mesh (polypropylene).   SURGEON:  Adolph Pollack, M.D.   ASSISTANT:  Currie Paris, M.D.   ANESTHESIA:  General.   INDICATIONS:  This is a 60 year old female who has a right subcostal  incision with a lateral hernia that is symptomatic and also has an  umbilical hernia and now presents for repair.   TECHNIQUE:  She was brought to the operating room, placed supine on the  operating table and a general anesthetic was administered.  Foley  catheter was placed in the bladder and oral gastric tube placed.  The  abdominal wall was then widely sterilely prepped and draped.  In the  left upper quadrant a small incision was made and using a 5 mm OptiView  trocar I gained access to the peritoneal cavity and created  pneumoperitoneum.  After pneumoperitoneum was sufficient I inserted the  5 mm scope and looked under the trocar site and saw no evidence of  bleeding or injury to any solid or hollow organs.   Following this I visualized the umbilical hernia which was small and  noted adhesions to the lateral aspect of the cholecystectomy scar.  I  placed two 5 mm trocars in the left abdomen.  I began by dissecting  omentum free from the abdominal wall along the side of her hernia and  identified the hernia.  The colon was close to  the hernia but not up in  the defect.  I used sharp dissection to dissect omentum free and allow  the colon to drop back into the peritoneal cavity.  I then identified  the hernia sac and divided this with the Harmonic scalpel removing all  the omentum from the hernia.  There was no colon in the hernia, only  omentum.   I then cleared the fascia from the subcutaneous tissue 5-6 cm around the  defect.  Using a spinal needle I marked four quadrants in the defect and  measured 4 cm away from these.  This was a 12 x 12 cm circular area and  I brought a 12 x 12 cm piece of Parietex mesh with one side having a  nonadherent barrier into the field.   Four sutures of #1 Novofil were placed in the four quadrants of the  mesh.  I then removed one of the 5 mm trocars and replaced it with the  10 mm trocar.  The mesh was hydrated and inserted into the  peritoneal  cavity with the 10 mm trocar.  I then unrolled the mesh making sure that  the nonadherent barrier faced the viscera.  I made four stab incisions  at the four quadrants around the hernia and brought up the anchoring  sutures of #1 Novofil across the fascial bridge.  These were then tied  down initially anchoring the mesh to the abdominal wall with adequate  coverage and overlap of the defect.  Using a spiral tacker I then put in  outer and inner rim tacks for further anchoring.   I then inspected the area and found two bleeding points of the omentum  which were controlled with Harmonic scalpel.  No further bleeding was  noted.  No evidence of injury to solid or hollow organs were noted as  all quadrants were inspected.   Following this then I released the trocars and allowed the  pneumoperitoneum to be released as well.  I then approached the  umbilicus.  A transverse subumbilical incision was made through the skin  and subcutaneous tissue.  I then identified the fascia and using blunt  dissection I isolated the umbilicus which was close  to the hernia and I  dissected the umbilicus free from the fascia exposing the underlying  hernia.  I then used electrocautery to expose fascia for a distance of  about 3 cm around the primary defect.  I then closed the primary defect  with interrupted zero Surgilon sutures leaving them long.  A piece of  polypropylene mesh was brought into the field and then I threaded the  Surgilon sutures through the mesh and tied the mesh down directly over  the primary closure of the hernia.   I then anchored the periphery of the mesh to the fascia a distance about  3 cm away from the primary closure with a running 0 Prolene suture.  Hemostasis was adequate at this time.  The umbilicus was reimplanted to  the mesh with a 3-0 Vicryl suture.  The subcutaneous tissue was closed  over the mesh with a running 3-0 Vicryl suture.  All skin incisions were  then closed with interrupted 4-0 Monocryl subcuticular stitches.  Steri-  Strips and sterile dressings were applied.  She tolerated the procedure  without apparent complications and was taken to the recovery room in  satisfactory condition.      Adolph Pollack, M.D.  Electronically Signed     TJR/MEDQ  D:  11/07/2008  T:  11/08/2008  Job:  [id number]   cc:   Valentino Hue. [name], M.D.  Fax: [phone]

## 2011-04-08 NOTE — Op Note (Signed)
NAMEKARINNA, BEADLES              ACCOUNT NO.:  [id number]   MEDICAL RECORD NO.:  [id number]          PATIENT TYPE:  INP   LOCATION:  1604                         FACILITY:  [facility]   PHYSICIAN:  Ollen Gross, M.D.    DATE OF BIRTH:  [birth year]   DATE OF PROCEDURE:  09/27/2007  DATE OF DISCHARGE:                               OPERATIVE REPORT   PREOPERATIVE DIAGNOSIS:  Osteoarthritis of left hip.   POSTOPERATIVE DIAGNOSIS:  Osteoarthritis of left hip.   PROCEDURE:  Left total hip arthroplasty.   SURGEON:  Ollen Gross, M.D.   ASSISTANT:  Avel Peace, P.A.-C   ANESTHESIA:  General.   ESTIMATED BLOOD LOSS:  400 mL   DRAIN:  Hemovac x1.   COMPLICATIONS:  None.   CONDITION:  Stable to Recovery.   BRIEF CLINICAL NOTE:  Ms. Heimann is a 60 year old female with end-stage  arthritis of the left hip with some dysplastic component.  She has had  progressively worsening pain and dysfunction and presents for total hip  arthroplasty.   PROCEDURE IN DETAIL:  After the successful administration of general  anesthetic, the patient was placed in the right lateral decubitus  position with the left side up and held with the hip positioner.  The  left lower extremity was isolated from her perineum with plastic drapes  and prepped and draped in the usual sterile fashion.  A short  posterolateral incision was made with a 10 blade through subcutaneous  tissue to the level of the fascia lata, which was incised in line with  the skin incision.  The sciatic nerve was palpated and protected and the  short rotators were isolated off the femur.  Capsulectomy was performed  and the hip was dislocated.  Center of the femoral head is marked and  the trial prosthesis placed, such that the center of the trial head  corresponds to the center of the native femoral head.  Osteotomy line is  marked on the femoral neck and osteotomy made with an oscillating saw.  The femoral head is removed and then the  femur retracted anteriorly to  gain acetabular exposure.   Acetabular retractors are placed and labrum and osteophytes removed.  Reaming starts at 45 mm, coursing in increments of 2 to 49 mm.  A 50-mm  Pinnacle acetabular shell is placed in anatomic position and transfixed  with 2 dome screws.  The apex hole eliminator is placed and the 36-mm  neutral Ultamet metal liner is placed for a metal-on-metal hip  replacement.   The femur is prepared with the canal finder and irrigation.  Axial  reaming is performed up to 13.5 mm, proximal reaming to 18D and the  sleeve machined to a small.  The 18D small trial sleeve is placed and an  18 x 13 stem and 36 standard neck.  We matched her native anteversion.  A 36 +0 head is placed.  We reduced the femur and our soft tissue  tension was tight.  She had great stability with full extension, full  external rotation, 70 degrees of flexion, 40 degrees of adduction and 90  degrees of internal rotation, then 90 degrees of flexion and 70 degrees  of internal rotation.  By placing the left leg on top of the right, she  was a few millimeters long; this couple with the tight soft tissue  tension made me dislocate it, take out the trial and change to a 30 +4  neck.  We did this and matched our anteversion again.  With the 36 +0  head, we had a much better reduction from the standpoint of soft tissue  tension.  She still had the same stability as mentioned above.  There  was great stability.  Now by placing the left leg on top of the right,  the leg lengths are equal.  The hip is then dislocated and trials  removed.  Permanent 18D small sleeve is placed with the 18 x 13 stem, 30  +4 neck, matching native anteversion.  A 36 +0 head is placed and the  hip is reduced with the same stability parameters.  Wound is copiously  irrigated with saline solution and the short rotators reattached to the  femur through drill holes.  The fascia lata is closed over a  Hemovac  drain with interrupted #1 Vicryl, subcu closed with #1 and #2-0 Vicryl  and subcuticular running 4-0 Monocryl.  The incisions is cleaned and  dried and Steri-Strips and a bulky sterile dressing applied.  A drain is  hooked to suction and she is placed into a knee immobilizer, awakened  and transported to Recovery in stable condition.      Ollen Gross, M.D.  Electronically Signed     FA/MEDQ  D:  09/27/2007  T:  09/28/2007  Job:  [id number]

## 2011-04-08 NOTE — Discharge Summary (Signed)
Theresa Bradley, Theresa Bradley              ACCOUNT NO.:  [id number]   MEDICAL RECORD NO.:  [id number]          PATIENT TYPE:  INP   LOCATION:  1604                         FACILITY:  [facility]   PHYSICIAN:  Ollen Gross, M.D.    DATE OF BIRTH:  24-Aug-1951   DATE OF ADMISSION:  09/27/2007  DATE OF DISCHARGE:  09/30/2007                               DISCHARGE SUMMARY   ADMITTING DIAGNOSES:  1. Osteoarthritis of left hip with dysplasia.  2. Anxiety.  3. History of bronchitis.  4. Mild hypertension.  5. History of hemorrhoids.  6. History of jaundice at age 65.  7. Hypothyroidism.  8. Perimenopausal.   DISCHARGE DIAGNOSIS:  1. Osteoarthritis of left hip, status post left total hip replacement      arthroplasty.  2. Mild postop blood loss anemia, did not require transfusion.  3. Anxiety.  4. History of bronchitis.  5. Mild hypertension.  6. History of hemorrhoids.  7. History of jaundice at age 49.  33. Hypothyroidism.  9. Perimenopausal.   PROCEDURE:  September 27, 2007, left total hip.  Surgeon:  Dr. Lequita Halt.  Assistant:  Patrica Duel, PA-C.  Anesthesia:  General.   CONSULTANTS:  None.   BRIEF HISTORY:  Theresa Bradley is a 56-year female with end-stage arthritis  of the left hip with some dysplastic component, progressive worsening  pain and dysfunction, now presents for a total hip arthroplasty.   LABORATORY DATA:  Preop CBC shows a hemoglobin of 13.0, hematocrit 38.9,  white cell count 5.1, platelets 412.  Chem panel on admission:  Slightly  low potassium, preop, 3.3.  Remaining Chem panel all within normal  limits.  PT/INR, preop, 13.6 and 1.0 with a PTT of 36.  Preop UA  negative.  Serial CBCs were followed.  Hemoglobin did drop down to 10.7;  was last noted with a hemoglobin 9.6, crit of 28.2.  Serial BMETs were  followed.  Electrolytes remained within normal limits.  Serial pro times  followed per Coumadin protocol.  Last PT/INR 26.2 and 2.3.   X-RAYS:  Two-view chest,  September 22, 2007:  No acute cardiopulmonary  disease.   Preop hip films, September 22, 2007:  Osteoarthritis of left hip.  Postop  hip film, September 27, 2007:  Satisfactory appearance of the left total  hip.   Preop stress test dated September 09, 2007:  Normal stress nuclear test,  normal perfusion, normal LV systolic function, EF of 62% and was  confirmed by Dr. Lady Deutscher.   HOSPITAL COURSE:  The patient was admitted to [facility],  tolerated the procedure well and later transferred the recovery room and  then to the orthopedic floor; started on PCA and p.o. analgesic of pain  control following surgery.  Doing pretty well on the morning of day 1,  though did have some pain.  Encouraged p.o. meds.  Weaned off of PCA.  She did have some positive volume overload, she was up about 3 liters,  and some low urinary output.  We held her lisinopril and gave her some  gentle fluids and monitored her output.  Her output started  increasing  immediately and she started diuresing her fluid very well.  Started to  get up out of bed on day 1.   On day 2, she was doing a little bit better.  Dressing was changed.  Incision looked good.  She was weaned over to p.o. meds, had  discontinued the PCA and Colace, and also IV fluids.  Hemoglobin was  9.8, but she was asymptomatic with this.  Started to get up ambulating  with therapy, maintaining partial weightbearing.  She was up to about 75  feet.  Dressing was changed, incision looked good.  Continued to  progress well, and by day 3, she was doing a little bit better.  She  still had some pain but was under fairly good control.  Hemoglobin had  essentially stabilized; she was 9.8 the day before; she was 9.6 on  postop day 3.  Incision looked good.  Progressing with therapy and was  discharged home.   DISCHARGE PLAN:  1. Discharged home on September 30, 2007.  2. Discharge diagnoses:  Please see above.  3. Discharge meds:  Percocet, Robaxin,  Coumadin.  4. Follow up in 2 weeks.   ACTIVITY:  She is partial weightbearing, 25-50% to the  left lower  extremity.  Hip precautions total hip protocol.  Home health PT home  health nursing.  She may start showering; however, do not submerge the  incision under water.   DISPOSITION:  Home.   CONDITION ON DISCHARGE:  Improving.      Alexzandrew L. Perkins, P.A.C.      Ollen Gross, M.D.  Electronically Signed    ALP/MEDQ  D:  09/30/2007  T:  09/30/2007  Job:  [id number]   cc:   Ollen Gross, M.D.  Fax: [phone]   Evelena Peat, M.D.   Rema Fendt, NP   Elmore Guise., M.D.  Fax: [phone]

## 2011-04-23 ENCOUNTER — Ambulatory Visit (INDEPENDENT_AMBULATORY_CARE_PROVIDER_SITE_OTHER): Payer: BC Managed Care – PPO | Admitting: Family Medicine

## 2011-04-23 ENCOUNTER — Encounter: Payer: Self-pay | Admitting: Family Medicine

## 2011-04-23 VITALS — BP 160/92 | Temp 98.3°F | Wt 177.0 lb

## 2011-04-23 DIAGNOSIS — E039 Hypothyroidism, unspecified: Secondary | ICD-10-CM

## 2011-04-23 DIAGNOSIS — J209 Acute bronchitis, unspecified: Secondary | ICD-10-CM

## 2011-04-23 DIAGNOSIS — I10 Essential (primary) hypertension: Secondary | ICD-10-CM

## 2011-04-23 MED ORDER — LOSARTAN POTASSIUM 50 MG PO TABS: 50.0000 mg | ORAL_TABLET | Freq: Every day | ORAL | Status: DC

## 2011-04-23 NOTE — Progress Notes (Signed)
  Subjective:    Patient ID: Theresa Bradley, female    DOB: [birth year], 60 y.o.   MRN: [id number]  HPI Visit for the following issues  4-5 day history of cough productive past couple days. Started after some exposure to smoke. Increased sweats. Question low-grade fever. Intermittent headaches. Minimal nasal congestion. Patient is nonsmoker. No confirmed fever  History of elevated blood pressure. At one point treated with ACE inhibitor few years ago with possible cough. Recent blood pressures elevated as high as 170 systolic and close to 100 diastolic intermittently. Increased fatigue. Intermittent headaches. Denies chest pains. No palpitations. No lower extremity edema.   Review of Systems  Constitutional: Positive for fatigue. Negative for fever, chills, appetite change and unexpected weight change.  HENT: Positive for congestion. Negative for sore throat.   Respiratory: Positive for cough. Negative for shortness of breath and wheezing.   Cardiovascular: Negative for chest pain, palpitations and leg swelling.  Neurological: Positive for headaches. Negative for dizziness.       Objective:   Physical Exam  Constitutional: She is oriented to person, place, and time. She appears well-developed and well-nourished. No distress.  HENT:  Right Ear: External ear normal.  Left Ear: External ear normal.  Mouth/Throat: Oropharynx is clear and moist. No oropharyngeal exudate.  Eyes: Pupils are equal, round, and reactive to light.  Neck: Neck supple.  Cardiovascular: Normal rate, regular rhythm and normal heart sounds.   Pulmonary/Chest: Effort normal and breath sounds normal. No respiratory distress. She has no wheezes. She has no rales.  Musculoskeletal: She exhibits no edema.  Lymphadenopathy:    She has no cervical adenopathy.  Neurological: She is alert and oriented to person, place, and time. No cranial nerve deficit.          Assessment & Plan:  #1 acute bronchitis. Suspect viral.  Reassurance given. No antibiotic indicated at this time #2 hypertension. Currently untreated. Start losartan 50 mg daily. Reassess one month #3 hypothyroidism. Needs repeat lab work at followup in one month

## 2011-04-30 ENCOUNTER — Other Ambulatory Visit: Payer: Self-pay | Admitting: Family Medicine

## 2011-04-30 ENCOUNTER — Other Ambulatory Visit: Payer: Self-pay | Admitting: *Deleted

## 2011-04-30 MED ORDER — ALPRAZOLAM 0.5 MG PO TABS: 0.5000 mg | ORAL_TABLET | Freq: Every evening | ORAL | Status: DC | PRN

## 2011-04-30 NOTE — Telephone Encounter (Signed)
Pt was just here for ov 04/24/11

## 2011-04-30 NOTE — Telephone Encounter (Signed)
Ok to refill once

## 2011-04-30 NOTE — Telephone Encounter (Signed)
Rx refill request for alprazolam 0.5, to use as needed, last filled 02-12-11

## 2011-05-01 LAB — HM MAMMOGRAPHY: HM Mammogram: NORMAL

## 2011-05-12 ENCOUNTER — Other Ambulatory Visit: Payer: Self-pay | Admitting: Family Medicine

## 2011-05-12 DIAGNOSIS — Z9889 Other specified postprocedural states: Secondary | ICD-10-CM

## 2011-05-19 ENCOUNTER — Ambulatory Visit
Admission: RE | Admit: 2011-05-19 | Discharge: 2011-05-19 | Disposition: A | Discharge status: Home / Self Care | Payer: BC Managed Care – PPO | Source: Ambulatory Visit | Attending: Family Medicine | Admitting: Family Medicine

## 2011-05-19 DIAGNOSIS — Z9889 Other specified postprocedural states: Secondary | ICD-10-CM

## 2011-05-23 ENCOUNTER — Ambulatory Visit (INDEPENDENT_AMBULATORY_CARE_PROVIDER_SITE_OTHER): Payer: BC Managed Care – PPO | Admitting: Family Medicine

## 2011-05-23 ENCOUNTER — Encounter: Payer: Self-pay | Admitting: Family Medicine

## 2011-05-23 DIAGNOSIS — I10 Essential (primary) hypertension: Secondary | ICD-10-CM

## 2011-05-23 DIAGNOSIS — M25519 Pain in unspecified shoulder: Secondary | ICD-10-CM

## 2011-05-23 NOTE — Patient Instructions (Signed)
Work on weight loss and regular aerobic exercise. Blood pressure goal is less than 140/90

## 2011-05-23 NOTE — Progress Notes (Signed)
  Subjective:    Patient ID: Theresa Bradley, female    DOB: 30-Mar-1951, 60 y.o.   MRN: [id number]  HPI Followup hypertension. Recent addition losartan 50 mg. No side effects. Blood pressures have improved and overall she feels better.  She is having some left shoulder pains especially with abduction and internal rotation. Occasional sensation of electric current going down arm but no weakness. No muscle atrophy. Occasional pain at base of neck. No reported injury. No history of cervical disc problems.   Review of Systems  Constitutional: Negative for activity change, appetite change and unexpected weight change.  Respiratory: Negative for shortness of breath.   Cardiovascular: Negative for chest pain, palpitations and leg swelling.  Neurological: Negative for dizziness, syncope, weakness and headaches.  Hematological: Negative for adenopathy.       Objective:   Physical Exam  Constitutional: She appears well-developed and well-nourished.  Cardiovascular: Normal rate, regular rhythm and normal heart sounds.   No murmur heard. Pulmonary/Chest: Effort normal and breath sounds normal. No respiratory distress. She has no wheezes. She has no rales.  Musculoskeletal: She exhibits no edema.       Patient has minimal bicipital tenderness on the left. She has some mild pain with internal rotation and abduction but no definite rotator cuff weakness. She has symmetric upper extremity reflexes. Strength is full throughout          Assessment & Plan:  #1 hypertension improved. Work on weight loss and reassess at physical in a few months. Consider titration of losartan at that time if still elevated #2 left shoulder pain. May have combination of tendinitis/bursitis versus component of cervical radiculopathy. No weakness at this time. Observe and follow up if worsens or persists.

## 2011-06-16 ENCOUNTER — Other Ambulatory Visit: Payer: Self-pay | Admitting: Family Medicine

## 2011-06-24 ENCOUNTER — Other Ambulatory Visit (INDEPENDENT_AMBULATORY_CARE_PROVIDER_SITE_OTHER): Payer: BC Managed Care – PPO

## 2011-06-24 DIAGNOSIS — Z0000 Encounter for general adult medical examination without abnormal findings: Secondary | ICD-10-CM

## 2011-06-24 LAB — CBC WITH DIFFERENTIAL/PLATELET
Basophils Absolute: 0 10*3/uL (ref 0.0–0.1)
Eosinophils Absolute: 0.2 10*3/uL (ref 0.0–0.7)
Hemoglobin: 14.1 g/dL (ref 12.0–15.0)
Lymphocytes Relative: 35 % (ref 12.0–46.0)
Lymphs Abs: 1.9 10*3/uL (ref 0.7–4.0)
MCHC: 33.5 g/dL (ref 30.0–36.0)
Monocytes Absolute: 0.4 10*3/uL (ref 0.1–1.0)
Neutro Abs: 3 10*3/uL (ref 1.4–7.7)
RDW: 13.3 % (ref 11.5–14.6)

## 2011-06-24 LAB — BASIC METABOLIC PANEL
CO2: 26 mEq/L (ref 19–32)
Calcium: 8.8 mg/dL (ref 8.4–10.5)
Creatinine, Ser: 0.7 mg/dL (ref 0.4–1.2)
Glucose, Bld: 87 mg/dL (ref 70–99)
Sodium: 142 mEq/L (ref 135–145)

## 2011-06-24 LAB — POCT URINALYSIS DIPSTICK
Blood, UA: NEGATIVE
Spec Grav, UA: 1.03
Urobilinogen, UA: 0.2
pH, UA: 5.5

## 2011-06-24 LAB — LIPID PANEL
Cholesterol: 219 mg/dL — ABNORMAL HIGH (ref 0–200)
HDL: 51.9 mg/dL (ref >39.00)
Triglycerides: 159 mg/dL — ABNORMAL HIGH (ref 0.0–149.0)
VLDL: 31.8 mg/dL (ref 0.0–40.0)

## 2011-06-24 LAB — HEPATIC FUNCTION PANEL
Albumin: 4.3 g/dL (ref 3.5–5.2)
Alkaline Phosphatase: 74 U/L (ref 39–117)

## 2011-07-01 ENCOUNTER — Encounter: Payer: Self-pay | Admitting: Family Medicine

## 2011-07-01 ENCOUNTER — Ambulatory Visit (INDEPENDENT_AMBULATORY_CARE_PROVIDER_SITE_OTHER): Payer: BC Managed Care – PPO | Admitting: Family Medicine

## 2011-07-01 VITALS — BP 130/82 | HR 72 | Temp 98.1°F | Resp 12 | Ht 64.5 in | Wt 174.0 lb

## 2011-07-01 DIAGNOSIS — I10 Essential (primary) hypertension: Secondary | ICD-10-CM

## 2011-07-01 MED ORDER — LOSARTAN POTASSIUM 50 MG PO TABS: 50.0000 mg | ORAL_TABLET | Freq: Every day | ORAL | Status: DC

## 2011-07-01 NOTE — Progress Notes (Signed)
Subjective:    Patient ID: Theresa Bradley, female    DOB: [birth year], 60 y.o.   MRN: [id number]  HPI Here for CPE.  Pap smear last year normal. Colonoscopy 2010. Mammogram June of this year normal. Has had previous right breast biopsies. Tetanus is up-to-date. Shingles vaccine last year and Pneumovax 4 years ago.  Past medical history, social history, and family history reviewed. She has persistent left neck and shoulder pains. Occasional radiation down left arm. Overall symptoms slightly improved from last visit. No progressive weakness. No progressive numbness. Pain is mild to moderate and intermittent. Hypertension treated with losartan. Improved readings by home cuff.  Past Medical History  Diagnosis Date  . HYPOTHYROIDISM 04/16/2009  . HYPERTENSION 04/16/2009  . SENILE LENTIGO 06/10/2010  . OSTEOARTHRITIS 04/16/2009  . COUGH, CHRONIC 05/10/2010   Past Surgical History  Procedure Date  . Cholecystectomy   . Breast surgery 2009    X 2, cancer stage 0, lumpectomy  . Joint replacement 2008    hip  . Hernia repair 2009  . Cesarean section     1979, 1982    reports that she has never smoked. She does not have any smokeless tobacco history on file. Her alcohol and drug histories not on file. family history includes Arthritis in her other; Cancer in her other; Hyperlipidemia in her father; and Hypertension in her other. Allergies  Allergen Reactions  . Codeine Sulfate   . Erythromycin Base   . Esomeprazole Magnesium     REACTION: GI upset      Review of Systems  Constitutional: Negative for fever, activity change, appetite change and fatigue.  HENT: Negative for hearing loss, ear pain, sore throat and trouble swallowing.   Eyes: Negative for visual disturbance.  Respiratory: Negative for cough and shortness of breath.   Cardiovascular: Negative for chest pain and palpitations.  Gastrointestinal: Negative for abdominal pain, diarrhea, constipation and blood in stool.    Genitourinary: Negative for dysuria and hematuria.  Musculoskeletal: Negative for myalgias, back pain and arthralgias.  Skin: Negative for rash.  Neurological: Negative for dizziness, syncope and headaches.  Hematological: Negative for adenopathy.  Psychiatric/Behavioral: Negative for confusion and dysphoric mood.       Objective:   Physical Exam  Constitutional: She is oriented to person, place, and time. She appears well-developed and well-nourished.  HENT:  Head: Normocephalic and atraumatic.  Eyes: EOM are normal. Pupils are equal, round, and reactive to light.  Neck: Normal range of motion. Neck supple. No thyromegaly present.  Cardiovascular: Normal rate, regular rhythm and normal heart sounds.   No murmur heard. Pulmonary/Chest: Breath sounds normal. No respiratory distress. She has no wheezes. She has no rales.       Breasts are symmetric with no mass  Abdominal: Soft. Bowel sounds are normal. She exhibits no distension and no mass. There is no tenderness. There is no rebound and no guarding.  Musculoskeletal: Normal range of motion. She exhibits no edema.  Lymphadenopathy:    She has no cervical adenopathy.  Neurological: She is alert and oriented to person, place, and time. She displays normal reflexes. No cranial nerve deficit.  Skin: No rash noted.  Psychiatric: She has a normal mood and affect. Her behavior is normal. Judgment and thought content normal.          Assessment & Plan:  Health maintenance. Labs reviewed with patient. Recommend regular walking and work on weight loss. Colonoscopy up to date. Immunizations up to date. Reminder for yearly flu vaccine.  Continue yearly mammogram. We discussed every 2-3 year Pap smear

## 2011-07-01 NOTE — Patient Instructions (Signed)
Recommend regular exercise/walking. Continue with yearly flu vaccine.

## 2011-08-22 ENCOUNTER — Other Ambulatory Visit: Payer: Self-pay | Admitting: *Deleted

## 2011-08-22 NOTE — Telephone Encounter (Signed)
Alprazolam refill request, last filled 04/30/11, #30 with o refills

## 2011-08-23 NOTE — Telephone Encounter (Signed)
Refill once OK. 

## 2011-08-25 MED ORDER — ALPRAZOLAM 0.5 MG PO TABS: 0.5000 mg | ORAL_TABLET | Freq: Every evening | ORAL | Status: DC | PRN

## 2011-08-26 MED ORDER — ALPRAZOLAM 0.5 MG PO TABS: 0.5000 mg | ORAL_TABLET | Freq: Every evening | ORAL | Status: DC | PRN

## 2011-08-26 NOTE — Telephone Encounter (Signed)
Addended by: Melchor Amour on: 08/26/2011 01:15 PM   Modules accepted: Orders

## 2011-08-29 LAB — TYPE AND SCREEN

## 2011-08-29 LAB — DIFFERENTIAL
Eosinophils Relative: 3 % (ref 0–5)
Lymphocytes Relative: 33 % (ref 12–46)
Lymphs Abs: 1.4 10*3/uL (ref 0.7–4.0)

## 2011-08-29 LAB — COMPREHENSIVE METABOLIC PANEL
AST: 18 U/L (ref 0–37)
Albumin: 4 g/dL (ref 3.5–5.2)
CO2: 32 mEq/L (ref 19–32)
Calcium: 9.3 mg/dL (ref 8.4–10.5)
Creatinine, Ser: 0.72 mg/dL (ref 0.4–1.2)
GFR calc Af Amer: >60 mL/min (ref >60)
GFR calc non Af Amer: >60 mL/min (ref >60)
Total Protein: 7.1 g/dL (ref 6.0–8.3)

## 2011-08-29 LAB — PREGNANCY, URINE: Preg Test, Ur: NEGATIVE

## 2011-08-29 LAB — CBC
MCHC: 33.6 g/dL (ref 30.0–36.0)
MCV: 90 fL (ref 78.0–100.0)
Platelets: 323 10*3/uL (ref 150–400)
RDW: 13.6 % (ref 11.5–15.5)

## 2011-09-02 LAB — CBC
HCT: 29 — ABNORMAL LOW
Hemoglobin: 9.6 — ABNORMAL LOW
MCHC: 34
MCV: 87.4
MCV: 88.2
MCV: 88.9
RBC: 3.23 — ABNORMAL LOW
RBC: 3.27 — ABNORMAL LOW
RBC: 3.57 — ABNORMAL LOW
WBC: 10.4
WBC: 12.7 — ABNORMAL HIGH

## 2011-09-02 LAB — PROTIME-INR
INR: 1.1
INR: 2.3 — ABNORMAL HIGH
Prothrombin Time: 14.2

## 2011-09-02 LAB — BASIC METABOLIC PANEL
Calcium: 8.6
Chloride: 101
Chloride: 104
Creatinine, Ser: 0.69
GFR calc Af Amer: >60
GFR calc Af Amer: >60
GFR calc Af Amer: >60
GFR calc non Af Amer: >60
Potassium: 4
Potassium: 4
Sodium: 138
Sodium: 139

## 2011-09-03 LAB — COMPREHENSIVE METABOLIC PANEL
Alkaline Phosphatase: 69
BUN: 11
CO2: 30
GFR calc non Af Amer: >60
Glucose, Bld: 98
Potassium: 3.3 — ABNORMAL LOW
Total Bilirubin: 0.7
Total Protein: 7.4

## 2011-09-03 LAB — CBC
HCT: 38.9
Hemoglobin: 13
RBC: 4.41
RDW: 13.5

## 2011-09-03 LAB — URINALYSIS, ROUTINE W REFLEX MICROSCOPIC
Glucose, UA: NEGATIVE
Hgb urine dipstick: NEGATIVE
Specific Gravity, Urine: 1.006
pH: 6

## 2011-09-03 LAB — PREGNANCY, URINE: Preg Test, Ur: NEGATIVE

## 2011-09-03 LAB — TYPE AND SCREEN: Antibody Screen: NEGATIVE

## 2011-09-03 LAB — PROTIME-INR
INR: 1
Prothrombin Time: 13.6

## 2011-09-08 ENCOUNTER — Ambulatory Visit (INDEPENDENT_AMBULATORY_CARE_PROVIDER_SITE_OTHER): Payer: BC Managed Care – PPO

## 2011-09-08 DIAGNOSIS — Z23 Encounter for immunization: Secondary | ICD-10-CM

## 2011-12-22 ENCOUNTER — Other Ambulatory Visit: Payer: Self-pay | Admitting: *Deleted

## 2011-12-22 NOTE — Telephone Encounter (Signed)
Alprazolam 0.5 last filled on 08-26-11, #30 with 0 refills

## 2011-12-22 NOTE — Telephone Encounter (Signed)
Refill OK

## 2011-12-23 MED ORDER — ALPRAZOLAM 0.5 MG PO TABS: 0.5000 mg | ORAL_TABLET | Freq: Every evening | ORAL | Status: DC | PRN

## 2012-01-02 ENCOUNTER — Encounter: Payer: Self-pay | Admitting: Family Medicine

## 2012-01-02 ENCOUNTER — Ambulatory Visit (INDEPENDENT_AMBULATORY_CARE_PROVIDER_SITE_OTHER): Payer: BC Managed Care – PPO | Admitting: Family Medicine

## 2012-01-02 ENCOUNTER — Ambulatory Visit (INDEPENDENT_AMBULATORY_CARE_PROVIDER_SITE_OTHER)
Admission: RE | Admit: 2012-01-02 | Discharge: 2012-01-02 | Disposition: A | Discharge status: Home / Self Care | Payer: BC Managed Care – PPO | Source: Ambulatory Visit | Attending: Family Medicine | Admitting: Family Medicine

## 2012-01-02 DIAGNOSIS — I10 Essential (primary) hypertension: Secondary | ICD-10-CM

## 2012-01-02 DIAGNOSIS — R05 Cough: Secondary | ICD-10-CM

## 2012-01-02 DIAGNOSIS — R053 Chronic cough: Secondary | ICD-10-CM

## 2012-01-02 DIAGNOSIS — M25519 Pain in unspecified shoulder: Secondary | ICD-10-CM

## 2012-01-02 DIAGNOSIS — M25512 Pain in left shoulder: Secondary | ICD-10-CM

## 2012-01-02 DIAGNOSIS — R059 Cough, unspecified: Secondary | ICD-10-CM

## 2012-01-02 MED ORDER — LOSARTAN POTASSIUM 100 MG PO TABS: 100.0000 mg | ORAL_TABLET | Freq: Every day | ORAL | Status: DC

## 2012-01-02 NOTE — Progress Notes (Signed)
Subjective:    Patient ID: Theresa Bradley, female    DOB: 1951-05-08, 61 y.o.   MRN: [id number]  HPI  Patient with multiple issues as follows  Hypertension treated with losartan 50 mg daily. Recent several elevated blood pressures 140s 155 systolic and 90s diastolic. She had one episode recently where she had some bilateral blurred vision while on computer. No monocular blurring. No focal weakness. She has previously had cough with ACE inhibitor. No chest pains. No dizziness.  Patient had some chronic cough now for several months. Never smoked. Did see some improvement with measures to reduce GERD. Still has occasional postnasal drip symptoms. No dyspnea.  Ongoing left shoulder pain. Somewhat poorly localized. Occasional shocklike symptoms going into the hand but no weakness. No cervical neck pain. Pain with abduction and internal rotation. Occasional night pain. No history of injury.  Past Medical History  Diagnosis Date  . HYPOTHYROIDISM 04/16/2009  . HYPERTENSION 04/16/2009  . SENILE LENTIGO 06/10/2010  . OSTEOARTHRITIS 04/16/2009  . COUGH, CHRONIC 05/10/2010   Past Surgical History  Procedure Date  . Cholecystectomy   . Breast surgery 2009    X 2, cancer stage 0, lumpectomy  . Joint replacement 2008    hip  . Hernia repair 2009  . Cesarean section     1979, 1982    reports that she has never smoked. She does not have any smokeless tobacco history on file. Her alcohol and drug histories not on file. family history includes Arthritis in her other; Cancer in her other; Hyperlipidemia in her father; and Hypertension in her other. Allergies  Allergen Reactions  . Codeine Sulfate   . Erythromycin Base   . Esomeprazole Magnesium     REACTION: GI upset      Review of Systems  Constitutional: Negative for fever, chills, activity change and unexpected weight change.  Respiratory: Positive for cough. Negative for shortness of breath and wheezing.   Cardiovascular: Negative for  chest pain, palpitations and leg swelling.  Gastrointestinal: Negative for abdominal pain.  Genitourinary: Negative for dysuria.  Neurological: Positive for headaches. Negative for dizziness, syncope and weakness.  Hematological: Negative for adenopathy. Does not bruise/bleed easily.       Objective:   Physical Exam  Constitutional: She is oriented to person, place, and time. She appears well-developed and well-nourished.  Cardiovascular: Normal rate and regular rhythm.   Pulmonary/Chest: Effort normal and breath sounds normal. No respiratory distress. She has no wheezes. She has no rales.  Musculoskeletal:       Left shoulder reveals no muscle atrophy. No reproducible point tenderness. Pain with abduction and internal rotation. She has some mild weakness with external rotation. No bicipital tenderness. No a.c. joint tenderness  Neurological: She is alert and oriented to person, place, and time. She has normal reflexes. No cranial nerve deficit.       Weakness left shoulder external rotation otherwise no focal weakness.  Psychiatric: She has a normal mood and affect. Her behavior is normal.          Assessment & Plan:  #1 hypertension suboptimally controlled. Titrate losartan 100 mg daily  #2 ongoing left shoulder pain. Differential is rotator cuff tendinitis versus cervical disc. We agreed to a trial of corticosteroid injection if not improving the back consider cervical spine films.   Discussed risks and benefits of corticosteroid injection and patient consented.  After prepping skin with betadine, injected 40 mg depomedrol and 2 cc of plain xylocaine with 23 gauge one and one half  inch needle using posterior lateral approach and pt tolerated well. #3 mild dry cough. We tried multiple things previously. Offered pulmonary referral and she is not interested at this time. CXR ordered.  She does think cough is slightly improved.  She has eliminated mentholated products.

## 2012-01-02 NOTE — Patient Instructions (Signed)
Increase losartan to 100mg daily

## 2012-02-02 ENCOUNTER — Ambulatory Visit (INDEPENDENT_AMBULATORY_CARE_PROVIDER_SITE_OTHER): Payer: BC Managed Care – PPO | Admitting: Family Medicine

## 2012-02-02 ENCOUNTER — Encounter: Payer: Self-pay | Admitting: Family Medicine

## 2012-02-02 VITALS — BP 152/88 | Temp 98.0°F | Wt 172.0 lb

## 2012-02-02 DIAGNOSIS — R05 Cough: Secondary | ICD-10-CM

## 2012-02-02 DIAGNOSIS — R059 Cough, unspecified: Secondary | ICD-10-CM

## 2012-02-02 DIAGNOSIS — I10 Essential (primary) hypertension: Secondary | ICD-10-CM

## 2012-02-02 DIAGNOSIS — R053 Chronic cough: Secondary | ICD-10-CM

## 2012-02-02 MED ORDER — HYDROCHLOROTHIAZIDE 12.5 MG PO CAPS: 12.5000 mg | ORAL_CAPSULE | Freq: Every day | ORAL | Status: DC

## 2012-02-02 MED ORDER — FLUTICASONE PROPIONATE 50 MCG/ACT NA SUSP: 1.0000 | NASAL | Status: DC | PRN

## 2012-02-02 MED ORDER — HYDROCODONE-HOMATROPINE 5-1.5 MG/5ML PO SYRP: 5.0000 mL | ORAL_SOLUTION | Freq: Four times a day (QID) | ORAL | Status: AC | PRN

## 2012-02-02 MED ORDER — AZITHROMYCIN 250 MG PO TABS: ORAL_TABLET | ORAL | Status: AC

## 2012-02-02 NOTE — Patient Instructions (Signed)
Try chlorpheniramine for post nasal drip related cough

## 2012-02-02 NOTE — Progress Notes (Signed)
  Subjective:    Patient ID: Theresa Bradley, female    DOB: 1950-12-23, 61 y.o.   MRN: [id number]  HPI  Medical followup. Shoulder improved following injection recently. Blood pressure still elevated 150s  systolic. We increased her losartan 100 mg last visit. No side effects. Compliant with therapy.  She's had chronic cough for several months. Recent chest x-ray unremarkable. Cough was dry until 2 weeks ago. Cough productive of green mucus since then. She has tried over-the-counter Delsym cough syrup without much improvement. Denies any appetite or weight changes. Nonsmoker. No history of wheezing. No reflux symptoms. Still has some postnasal drip symptoms not improved with Claritin. She has elevated head of bed 6 inches. Avoiding mentholated products.  Past Medical History  Diagnosis Date  . HYPOTHYROIDISM 04/16/2009  . HYPERTENSION 04/16/2009  . SENILE LENTIGO 06/10/2010  . OSTEOARTHRITIS 04/16/2009  . COUGH, CHRONIC 05/10/2010   Past Surgical History  Procedure Date  . Cholecystectomy   . Breast surgery 2009    X 2, cancer stage 0, lumpectomy  . Joint replacement 2008    hip  . Hernia repair 2009  . Cesarean section     1979, 1982    reports that she has never smoked. She does not have any smokeless tobacco history on file. Her alcohol and drug histories not on file. family history includes Arthritis in her other; Cancer in her other; Hyperlipidemia in her father; and Hypertension in her other. Allergies  Allergen Reactions  . Codeine Sulfate   . Erythromycin Base   . Esomeprazole Magnesium     REACTION: GI upset      Review of Systems  Constitutional: Negative for fever, chills, appetite change and unexpected weight change.  HENT: Positive for postnasal drip. Negative for sore throat, trouble swallowing and voice change.   Respiratory: Positive for cough. Negative for shortness of breath and wheezing.   Cardiovascular: Negative for chest pain, palpitations and leg swelling.    Gastrointestinal: Negative for nausea, vomiting and abdominal pain.  Neurological: Negative for dizziness, syncope and headaches.  Hematological: Negative for adenopathy.       Objective:   Physical Exam  Constitutional: She is oriented to person, place, and time. She appears well-developed and well-nourished.  HENT:  Right Ear: External ear normal.  Left Ear: External ear normal.  Mouth/Throat: Oropharynx is clear and moist.  Neck: Neck supple.  Cardiovascular: Normal rate, regular rhythm and normal heart sounds.   Pulmonary/Chest: Effort normal and breath sounds normal. No respiratory distress. She has no wheezes. She has no rales.  Musculoskeletal: She exhibits no edema.  Lymphadenopathy:    She has no cervical adenopathy.  Neurological: She is alert and oriented to person, place, and time. No cranial nerve deficit.          Assessment & Plan:  #1 hypertension. Suboptimal control. Add HCTZ 12.5 mg daily to losartan and reassess blood pressure within 2 months. Work on weight loss #2 chronic cough. Question postnasal drip related. Add nighttime use of chlorpheniramine. Given duration of productive cough start Zithromax. Hycodan for nighttime cough. Consider pulmonary referral if cough not resolving next 2 weeks  #3 recent shoulder pain resolved following steroid injection

## 2012-02-07 ENCOUNTER — Other Ambulatory Visit: Payer: Self-pay | Admitting: Family Medicine

## 2012-03-24 ENCOUNTER — Other Ambulatory Visit: Payer: Self-pay | Admitting: *Deleted

## 2012-03-24 NOTE — Telephone Encounter (Signed)
Refill for 3 months OK. 

## 2012-03-24 NOTE — Telephone Encounter (Signed)
Alprazolam prn last filled 12-23-11 #30 with 0 refills

## 2012-03-25 MED ORDER — ALPRAZOLAM 0.5 MG PO TABS: 0.5000 mg | ORAL_TABLET | Freq: Every evening | ORAL | Status: DC | PRN

## 2012-04-05 ENCOUNTER — Ambulatory Visit: Payer: BC Managed Care – PPO | Admitting: Family Medicine

## 2012-04-14 ENCOUNTER — Telehealth: Payer: Self-pay | Admitting: *Deleted

## 2012-04-14 MED ORDER — LOSARTAN POTASSIUM-HCTZ 100-12.5 MG PO TABS: 1.0000 | ORAL_TABLET | Freq: Every day | ORAL | Status: DC

## 2012-04-14 NOTE — Telephone Encounter (Signed)
D/C hctz and Losartan and start Losartan/HCTZ 100/12.5 mg and OK to refill for one year.

## 2012-04-14 NOTE — Telephone Encounter (Signed)
Pt has been taking HCTZ 12.5 and losartan for several months and BP seems to be better.  She had been told if meds were working, she could get them together to = taking only one pill. Pt is out of Losartan.  [facility] Last appt was 02-02-12, pt cancelled 2 month follow-up visit.

## 2012-04-14 NOTE — Telephone Encounter (Signed)
Pt informed

## 2012-05-14 ENCOUNTER — Other Ambulatory Visit: Payer: Self-pay | Admitting: Family Medicine

## 2012-05-14 DIAGNOSIS — Z1231 Encounter for screening mammogram for malignant neoplasm of breast: Secondary | ICD-10-CM

## 2012-06-11 ENCOUNTER — Other Ambulatory Visit: Payer: Self-pay | Admitting: Family Medicine

## 2012-06-11 ENCOUNTER — Ambulatory Visit (HOSPITAL_COMMUNITY)
Admission: RE | Admit: 2012-06-11 | Discharge: 2012-06-11 | Disposition: A | Discharge status: Home / Self Care | Payer: BC Managed Care – PPO | Source: Ambulatory Visit | Attending: Family Medicine | Admitting: Family Medicine

## 2012-06-11 DIAGNOSIS — Z1231 Encounter for screening mammogram for malignant neoplasm of breast: Secondary | ICD-10-CM

## 2012-06-11 DIAGNOSIS — Z853 Personal history of malignant neoplasm of breast: Secondary | ICD-10-CM

## 2012-06-15 ENCOUNTER — Ambulatory Visit
Admission: RE | Admit: 2012-06-15 | Discharge: 2012-06-15 | Disposition: A | Discharge status: Home / Self Care | Payer: BC Managed Care – PPO | Source: Ambulatory Visit | Attending: Family Medicine | Admitting: Family Medicine

## 2012-06-15 DIAGNOSIS — Z853 Personal history of malignant neoplasm of breast: Secondary | ICD-10-CM

## 2012-08-20 ENCOUNTER — Ambulatory Visit (INDEPENDENT_AMBULATORY_CARE_PROVIDER_SITE_OTHER): Payer: BC Managed Care – PPO

## 2012-08-20 DIAGNOSIS — Z23 Encounter for immunization: Secondary | ICD-10-CM

## 2012-09-06 ENCOUNTER — Other Ambulatory Visit: Payer: BC Managed Care – PPO

## 2012-09-07 ENCOUNTER — Other Ambulatory Visit (INDEPENDENT_AMBULATORY_CARE_PROVIDER_SITE_OTHER): Payer: BC Managed Care – PPO

## 2012-09-07 DIAGNOSIS — Z0000 Encounter for general adult medical examination without abnormal findings: Secondary | ICD-10-CM

## 2012-09-07 LAB — HEPATIC FUNCTION PANEL
ALT: 23 U/L (ref 0–35)
AST: 18 U/L (ref 0–37)
Albumin: 3.7 g/dL (ref 3.5–5.2)
Alkaline Phosphatase: 52 U/L (ref 39–117)

## 2012-09-07 LAB — CBC WITH DIFFERENTIAL/PLATELET
Basophils Relative: 0.8 % (ref 0.0–3.0)
Eosinophils Relative: 4 % (ref 0.0–5.0)
HCT: 40.1 % (ref 36.0–46.0)
Lymphs Abs: 1.5 10*3/uL (ref 0.7–4.0)
MCHC: 33.1 g/dL (ref 30.0–36.0)
MCV: 91.5 fl (ref 78.0–100.0)
Monocytes Absolute: 0.4 10*3/uL (ref 0.1–1.0)
RBC: 4.38 Mil/uL (ref 3.87–5.11)
WBC: 4.3 10*3/uL — ABNORMAL LOW (ref 4.5–10.5)

## 2012-09-07 LAB — BASIC METABOLIC PANEL
BUN: 12 mg/dL (ref 6–23)
CO2: 31 mEq/L (ref 19–32)
Chloride: 101 mEq/L (ref 96–112)
Potassium: 3.5 mEq/L (ref 3.5–5.1)

## 2012-09-07 LAB — LDL CHOLESTEROL, DIRECT: Direct LDL: 152.6 mg/dL

## 2012-09-07 LAB — POCT URINALYSIS DIPSTICK
Blood, UA: NEGATIVE
Ketones, UA: NEGATIVE
Protein, UA: NEGATIVE
Spec Grav, UA: >=1.03
Urobilinogen, UA: 0.2
pH, UA: 5.5

## 2012-09-07 LAB — LIPID PANEL: VLDL: 33.4 mg/dL (ref 0.0–40.0)

## 2012-09-13 ENCOUNTER — Ambulatory Visit (INDEPENDENT_AMBULATORY_CARE_PROVIDER_SITE_OTHER): Payer: BC Managed Care – PPO | Admitting: Family Medicine

## 2012-09-13 ENCOUNTER — Encounter: Payer: Self-pay | Admitting: Family Medicine

## 2012-09-13 VITALS — BP 120/68 | HR 72 | Temp 98.3°F | Resp 12 | Ht 64.0 in | Wt 174.0 lb

## 2012-09-13 DIAGNOSIS — Z0000 Encounter for general adult medical examination without abnormal findings: Secondary | ICD-10-CM

## 2012-09-13 DIAGNOSIS — K219 Gastro-esophageal reflux disease without esophagitis: Secondary | ICD-10-CM

## 2012-09-13 MED ORDER — PANTOPRAZOLE SODIUM 40 MG PO TBEC: 40.0000 mg | DELAYED_RELEASE_TABLET | Freq: Every day | ORAL | Status: DC

## 2012-09-13 NOTE — Progress Notes (Signed)
Subjective:    Patient ID: Theresa Bradley, female    DOB: Sep 02, 1951, 61 y.o.   MRN: [id number]  HPI  Patient for complete physical. She had normal Pap smear couple years ago. Low-risk. She has elected to get every 3 years. Mammogram last summer normal. Flu vaccine in September. Other immunizations up to date. Last bone density scan 2008 with T score -1.1. Takes some calcium and vitamin D.  Overall doing fairly well. Has had some frequent GERD symptoms. Frequent sour taste in mouth and intermittent cough. She has modified diet. Tagamet without much relief. Prilosec helped after a couple weeks slightly did not fully relieve symptoms. No dysphagia. No appetite or weight changes. No abdominal pain. No stool changes.  Past Medical History  Diagnosis Date  . HYPOTHYROIDISM 04/16/2009  . HYPERTENSION 04/16/2009  . SENILE LENTIGO 06/10/2010  . OSTEOARTHRITIS 04/16/2009  . COUGH, CHRONIC 05/10/2010   Past Surgical History  Procedure Date  . Cholecystectomy   . Breast surgery 2009    X 2, cancer stage 0, lumpectomy  . Joint replacement 2008    hip  . Hernia repair 2009  . Cesarean section     1979, 1982    reports that she has never smoked. She does not have any smokeless tobacco history on file. Her alcohol and drug histories not on file. family history includes Arthritis in her other; Cancer in her other; Hyperlipidemia in her father; and Hypertension in her other. Allergies  Allergen Reactions  . Codeine Sulfate   . Erythromycin Base   . Esomeprazole Magnesium     REACTION: GI upset      Review of Systems  Constitutional: Negative for fever, activity change, appetite change, fatigue and unexpected weight change.  HENT: Negative for hearing loss, ear pain, sore throat and trouble swallowing.   Eyes: Negative for visual disturbance.  Respiratory: Negative for cough and shortness of breath.   Cardiovascular: Negative for chest pain and palpitations.  Gastrointestinal: Negative for  abdominal pain, diarrhea, constipation and blood in stool.  Genitourinary: Negative for dysuria and hematuria.  Musculoskeletal: Negative for myalgias, back pain and arthralgias.  Skin: Negative for rash.  Neurological: Negative for dizziness, syncope and headaches.  Hematological: Negative for adenopathy.  Psychiatric/Behavioral: Negative for confusion and dysphoric mood.       Objective:   Physical Exam  Constitutional: She is oriented to person, place, and time. She appears well-developed and well-nourished.  HENT:  Head: Normocephalic and atraumatic.  Eyes: EOM are normal. Pupils are equal, round, and reactive to light.  Neck: Normal range of motion. Neck supple. No thyromegaly present.  Cardiovascular: Normal rate, regular rhythm and normal heart sounds.   No murmur heard. Pulmonary/Chest: Breath sounds normal. No respiratory distress. She has no wheezes. She has no rales.  Abdominal: Soft. Bowel sounds are normal. She exhibits no distension and no mass. There is no tenderness. There is no rebound and no guarding.  Genitourinary:       Breasts are symmetric without mass  Musculoskeletal: Normal range of motion. She exhibits no edema.  Lymphadenopathy:    She has no cervical adenopathy.  Neurological: She is alert and oriented to person, place, and time. She displays normal reflexes. No cranial nerve deficit.  Skin: No rash noted.  Psychiatric: She has a normal mood and affect. Her behavior is normal. Judgment and thought content normal.          Assessment & Plan:  Complete physical. Immunizations up to date. Labs reviewed with  patient and favorable. Schedule DEXA scan with next mammogram next year. She requests going to every 3 year Pap smears and she is low risk and has had multiple normals  GERD. Start Protonix 40 mg once daily. Try short-term use one to 2 months and discontinue then if symptoms controlled

## 2012-09-13 NOTE — Patient Instructions (Addendum)
Diet for Gastroesophageal Reflux Disease, Adult Reflux (acid reflux) is when acid from your stomach flows up into the esophagus. When acid comes in contact with the esophagus, the acid causes irritation and soreness (inflammation) in the esophagus. When reflux happens often or so severely that it causes damage to the esophagus, it is called gastroesophageal reflux disease (GERD). Nutrition therapy can help ease the discomfort of GERD. FOODS OR DRINKS TO AVOID OR LIMIT  Smoking or chewing tobacco. Nicotine is one of the most potent stimulants to acid production in the gastrointestinal tract.  Caffeinated and decaffeinated coffee and black tea.  Regular or low-calorie carbonated beverages or energy drinks (caffeine-free carbonated beverages are allowed).   Strong spices, such as black pepper, white pepper, red pepper, cayenne, curry powder, and chili powder.  Peppermint or spearmint.  Chocolate.  High-fat foods, including meats and fried foods. Extra added fats including oils, butter, salad dressings, and nuts. Limit these to less than 8 tsp per day.  Fruits and vegetables if they are not tolerated, such as citrus fruits or tomatoes.  Alcohol.  Any food that seems to aggravate your condition. If you have questions regarding your diet, call your caregiver or a registered dietitian. OTHER THINGS THAT MAY HELP GERD INCLUDE:   Eating your meals slowly, in a relaxed setting.  Eating 5 to 6 small meals per day instead of 3 large meals.  Eliminating food for a period of time if it causes distress.  Not lying down until 3 hours after eating a meal.  Keeping the head of your bed raised 6 to 9 inches (15 to 23 cm) by using a foam wedge or blocks under the legs of the bed. Lying flat may make symptoms worse.  Being physically active. Weight loss may be helpful in reducing reflux in overweight or obese adults.  Wear loose fitting clothing EXAMPLE MEAL PLAN This meal plan is approximately  2,000 calories based on https://www.[facility].org/ meal planning guidelines. Breakfast   cup cooked oatmeal.  1 cup strawberries.  1 cup low-fat milk.  1 oz almonds. Snack  1 cup cucumber slices.  6 oz yogurt (made from low-fat or fat-free milk). Lunch  2 slice whole-wheat bread.  2 oz sliced [place].  2 tsp mayonnaise.  1 cup blueberries.  1 cup snap peas. Snack  6 whole-wheat crackers.  1 oz string cheese. Dinner   cup brown rice.  1 cup mixed veggies.  1 tsp olive oil.  3 oz grilled fish. Document Released: 11/10/2005 Document Revised: 02/02/2012 Document Reviewed: 09/26/2011 [facility] Patient Information 2013 [place], [place].  Recommend: Calcium at least 1200 mg daily Vitamin D at least 800 international units daily Protonix 40 mg once daily. Discontinued after 2 months if symptoms improved

## 2012-10-10 ENCOUNTER — Other Ambulatory Visit: Payer: Self-pay | Admitting: Family Medicine

## 2012-10-11 NOTE — Telephone Encounter (Signed)
Refill times three.  Try to use sparingly

## 2012-10-11 NOTE — Telephone Encounter (Signed)
Pt just had CPX, alprazolam last filled 03-25-12, #30 with 2 refills

## 2012-10-12 NOTE — Telephone Encounter (Signed)
Pt informed

## 2013-01-09 ENCOUNTER — Other Ambulatory Visit: Payer: Self-pay | Admitting: Family Medicine

## 2013-01-30 ENCOUNTER — Other Ambulatory Visit: Payer: Self-pay | Admitting: Family Medicine

## 2013-02-01 ENCOUNTER — Other Ambulatory Visit: Payer: Self-pay | Admitting: Family Medicine

## 2013-02-06 ENCOUNTER — Other Ambulatory Visit: Payer: Self-pay | Admitting: Family Medicine

## 2013-04-03 ENCOUNTER — Other Ambulatory Visit: Payer: Self-pay | Admitting: Family Medicine

## 2013-05-03 ENCOUNTER — Telehealth: Payer: Self-pay | Admitting: *Deleted

## 2013-05-03 NOTE — Telephone Encounter (Signed)
We received a faxed written note from pt requesting she try DAW Brand Synthroid.  She has taken the generic levothyroxine 50 mcg for quite a long time.  Pt has a physical coming up in a few months and reports a few of her friends could really tell the difference between generic and Brand.  She needs a refill and has checked with her insurance and knows what the cost is and is willing to pay a little more to try it for a few months.  Please advise

## 2013-05-03 NOTE — Telephone Encounter (Signed)
OK to change to brand name Synthroid.

## 2013-05-04 MED ORDER — SYNTHROID 50 MCG PO TABS: 50.0000 ug | ORAL_TABLET | Freq: Every day | ORAL | Status: DC

## 2013-05-04 NOTE — Telephone Encounter (Signed)
Pt informed Rx sent. 

## 2013-06-07 ENCOUNTER — Other Ambulatory Visit: Payer: Self-pay | Admitting: Family Medicine

## 2013-06-07 DIAGNOSIS — Z853 Personal history of malignant neoplasm of breast: Secondary | ICD-10-CM

## 2013-06-20 ENCOUNTER — Ambulatory Visit
Admission: RE | Admit: 2013-06-20 | Discharge: 2013-06-20 | Disposition: A | Discharge status: Home / Self Care | Payer: Self-pay | Source: Ambulatory Visit | Attending: Family Medicine | Admitting: Family Medicine

## 2013-06-20 DIAGNOSIS — Z853 Personal history of malignant neoplasm of breast: Secondary | ICD-10-CM

## 2013-08-02 ENCOUNTER — Other Ambulatory Visit: Payer: Self-pay | Admitting: Family Medicine

## 2013-08-25 ENCOUNTER — Other Ambulatory Visit: Payer: Self-pay | Admitting: Family Medicine

## 2013-08-25 NOTE — Telephone Encounter (Signed)
Last refill 10/10/12 #30 2refill Last visit 09/13/12

## 2013-08-25 NOTE — Telephone Encounter (Signed)
May refill once only .  Needs office follow up.

## 2013-08-31 ENCOUNTER — Ambulatory Visit: Payer: Self-pay

## 2013-09-29 ENCOUNTER — Other Ambulatory Visit: Payer: Self-pay

## 2013-10-04 ENCOUNTER — Other Ambulatory Visit: Payer: Self-pay | Admitting: Family Medicine

## 2013-10-24 ENCOUNTER — Other Ambulatory Visit (INDEPENDENT_AMBULATORY_CARE_PROVIDER_SITE_OTHER): Payer: BC Managed Care – PPO

## 2013-10-24 ENCOUNTER — Other Ambulatory Visit: Payer: Self-pay | Admitting: Family Medicine

## 2013-10-24 ENCOUNTER — Encounter: Payer: Self-pay | Admitting: Family Medicine

## 2013-10-24 DIAGNOSIS — Z0000 Encounter for general adult medical examination without abnormal findings: Secondary | ICD-10-CM

## 2013-10-24 LAB — CBC WITH DIFFERENTIAL/PLATELET
Basophils Relative: 0.7 % (ref 0.0–3.0)
Eosinophils Relative: 4 % (ref 0.0–5.0)
Hemoglobin: 13.7 g/dL (ref 12.0–15.0)
Lymphocytes Relative: 32 % (ref 12.0–46.0)
MCHC: 33.6 g/dL (ref 30.0–36.0)
MCV: 88.9 fl (ref 78.0–100.0)
Monocytes Relative: 7.7 % (ref 3.0–12.0)
Neutro Abs: 2.7 10*3/uL (ref 1.4–7.7)
Neutrophils Relative %: 55.6 % (ref 43.0–77.0)
Platelets: 371 10*3/uL (ref 150.0–400.0)
RBC: 4.57 Mil/uL (ref 3.87–5.11)
WBC: 4.8 10*3/uL (ref 4.5–10.5)

## 2013-10-24 LAB — HEPATIC FUNCTION PANEL
ALT: 28 U/L (ref 0–35)
Albumin: 3.7 g/dL (ref 3.5–5.2)
Bilirubin, Direct: 0 mg/dL (ref 0.0–0.3)
Total Bilirubin: 0.8 mg/dL (ref 0.3–1.2)
Total Protein: 7.3 g/dL (ref 6.0–8.3)

## 2013-10-24 LAB — BASIC METABOLIC PANEL
BUN: 10 mg/dL (ref 6–23)
CO2: 31 mEq/L (ref 19–32)
Calcium: 9.2 mg/dL (ref 8.4–10.5)
Chloride: 102 mEq/L (ref 96–112)
Glucose, Bld: 89 mg/dL (ref 70–99)
Potassium: 3.7 mEq/L (ref 3.5–5.1)
Sodium: 140 mEq/L (ref 135–145)

## 2013-10-24 LAB — TSH: TSH: 1.07 u[IU]/mL (ref 0.35–5.50)

## 2013-10-24 LAB — LIPID PANEL
HDL: 41 mg/dL (ref >39.00)
Triglycerides: 243 mg/dL — ABNORMAL HIGH (ref 0.0–149.0)
VLDL: 48.6 mg/dL — ABNORMAL HIGH (ref 0.0–40.0)

## 2013-10-31 ENCOUNTER — Ambulatory Visit (INDEPENDENT_AMBULATORY_CARE_PROVIDER_SITE_OTHER): Payer: BC Managed Care – PPO | Admitting: Family Medicine

## 2013-10-31 ENCOUNTER — Encounter: Payer: Self-pay | Admitting: Family Medicine

## 2013-10-31 ENCOUNTER — Other Ambulatory Visit (HOSPITAL_COMMUNITY)
Admission: RE | Admit: 2013-10-31 | Discharge: 2013-10-31 | Disposition: A | Discharge status: Home / Self Care | Payer: BC Managed Care – PPO | Source: Ambulatory Visit | Attending: Family Medicine | Admitting: Family Medicine

## 2013-10-31 VITALS — BP 124/70 | HR 55 | Temp 98.2°F | Ht 64.0 in | Wt 174.0 lb

## 2013-10-31 DIAGNOSIS — N950 Postmenopausal bleeding: Secondary | ICD-10-CM

## 2013-10-31 DIAGNOSIS — Z01419 Encounter for gynecological examination (general) (routine) without abnormal findings: Secondary | ICD-10-CM | POA: Insufficient documentation

## 2013-10-31 DIAGNOSIS — Z0000 Encounter for general adult medical examination without abnormal findings: Secondary | ICD-10-CM

## 2013-10-31 MED ORDER — DESOXIMETASONE 0.25 % EX CREA: 1.0000 "application " | TOPICAL_CREAM | Freq: Two times a day (BID) | CUTANEOUS | Status: DC

## 2013-10-31 NOTE — Progress Notes (Signed)
Pre visit review using our clinic review tool, if applicable. No additional management support is needed unless otherwise documented below in the visit note. 

## 2013-10-31 NOTE — Progress Notes (Signed)
Subjective:    Patient ID: Theresa Bradley, female    DOB: 1951/02/27, 62 y.o.   MRN: [id number]  HPI Patient seen for complete physical Last Pap smear 3 years ago. Immunizations are up to date. Recent mammogram last summer normal. She has chronic problems including hypothyroidism, hypertension, GERD, and osteoarthritis. Medications reviewed and compliant with all.  She has problem with dryness involving hands. She's tried topical moisturizer without much improvement. Does not have her hands in any chemicals or irritants.  Patient had one episode last week of ?very mild vaginal spotting. No pelvic pain. No appetite or weight changes. Non-since then.  Past Medical History  Diagnosis Date  . HYPOTHYROIDISM 04/16/2009  . HYPERTENSION 04/16/2009  . SENILE LENTIGO 06/10/2010  . OSTEOARTHRITIS 04/16/2009  . COUGH, CHRONIC 05/10/2010   Past Surgical History  Procedure Laterality Date  . Cholecystectomy    . Breast surgery  2009    X 2, cancer stage 0, lumpectomy  . Joint replacement  2008    hip  . Hernia repair  2009  . Cesarean section      1979, 1982    reports that she has never smoked. She does not have any smokeless tobacco history on file. Her alcohol and drug histories are not on file. family history includes Arthritis in her other; Cancer in her other; Hyperlipidemia in her father; Hypertension in her other. Allergies  Allergen Reactions  . Codeine Sulfate   . Erythromycin Base   . Esomeprazole Magnesium     REACTION: GI upset      Review of Systems  Constitutional: Negative for fever, activity change, appetite change, fatigue and unexpected weight change.  HENT: Negative for ear pain, hearing loss, sore throat and trouble swallowing.   Eyes: Negative for visual disturbance.  Respiratory: Negative for cough and shortness of breath.   Cardiovascular: Negative for chest pain and palpitations.  Gastrointestinal: Negative for abdominal pain, diarrhea, constipation and  blood in stool.  Genitourinary: Negative for dysuria, hematuria, vaginal discharge and pelvic pain.  Musculoskeletal: Negative for arthralgias, back pain and myalgias.  Skin: Negative for rash.  Neurological: Negative for dizziness, syncope and headaches.  Hematological: Negative for adenopathy.  Psychiatric/Behavioral: Negative for confusion and dysphoric mood.       Objective:   Physical Exam  Constitutional: She is oriented to person, place, and time. She appears well-developed and well-nourished.  HENT:  Head: Normocephalic and atraumatic.  Eyes: EOM are normal. Pupils are equal, round, and reactive to light.  Neck: Normal range of motion. Neck supple. No thyromegaly present.  Cardiovascular: Normal rate, regular rhythm and normal heart sounds.   No murmur heard. Pulmonary/Chest: Breath sounds normal. No respiratory distress. She has no wheezes. She has no rales.  Abdominal: Soft. Bowel sounds are normal. She exhibits no distension and no mass. There is no tenderness. There is no rebound and no guarding.  Genitourinary: Vagina normal and uterus normal. No vaginal discharge found.  Breasts are symmetric with no mass Bimanual no mass and no uterine or adnexal tenderness.  Musculoskeletal: Normal range of motion. She exhibits no edema.  Lymphadenopathy:    She has no cervical adenopathy.  Neurological: She is alert and oriented to person, place, and time. She displays normal reflexes. No cranial nerve deficit.  Skin: No rash noted.  Psychiatric: She has a normal mood and affect. Her behavior is normal. Judgment and thought content normal.          Assessment & Plan:  Complete physical.  Pap smear obtained. Continue yearly mammograms. Labs reviewed with patient. Topicort 0.1% cream to hands once daily no more than 2 weeks continuously. Continue moisturizers. Set up pelvic ultrasound with episode of ?spotting as above.

## 2013-10-31 NOTE — Patient Instructions (Addendum)
Continue with yearly mammogram Remember not to use Topicort cream more than 2 weeks continuously and apply lightly to affected skin areas Continue yearly mammogram Remember daily calcium and vitamin D supplementation We will call you regarding pelvic ultrasound

## 2013-11-02 ENCOUNTER — Encounter: Payer: Self-pay | Admitting: Family Medicine

## 2013-11-03 ENCOUNTER — Other Ambulatory Visit: Payer: Self-pay | Admitting: Family Medicine

## 2013-11-03 DIAGNOSIS — Z0000 Encounter for general adult medical examination without abnormal findings: Secondary | ICD-10-CM

## 2013-11-25 ENCOUNTER — Encounter: Payer: Self-pay | Admitting: Family Medicine

## 2013-11-26 ENCOUNTER — Other Ambulatory Visit: Payer: Self-pay | Admitting: Family Medicine

## 2013-11-28 ENCOUNTER — Ambulatory Visit
Admission: RE | Admit: 2013-11-28 | Discharge: 2013-11-28 | Disposition: A | Discharge status: Home / Self Care | Payer: BC Managed Care – PPO | Source: Ambulatory Visit | Attending: Family Medicine | Admitting: Family Medicine

## 2013-11-28 DIAGNOSIS — Z0000 Encounter for general adult medical examination without abnormal findings: Secondary | ICD-10-CM

## 2013-11-29 NOTE — Addendum Note (Signed)
Addended by: [name] on: 11/29/2013 08:14 PM   Modules accepted: Orders

## 2013-12-08 ENCOUNTER — Other Ambulatory Visit: Payer: Self-pay | Admitting: Obstetrics and Gynecology

## 2013-12-10 ENCOUNTER — Other Ambulatory Visit: Payer: Self-pay | Admitting: Family Medicine

## 2013-12-11 ENCOUNTER — Telehealth: Payer: Self-pay | Admitting: Family Medicine

## 2013-12-11 NOTE — Telephone Encounter (Signed)
[facility] requesting refill of ALPRAZolam (XANAX) 0.5 MG tablet last filled 08/26/13

## 2013-12-12 MED ORDER — ALPRAZOLAM 0.5 MG PO TABS: ORAL_TABLET | ORAL | Status: DC

## 2013-12-12 NOTE — Telephone Encounter (Signed)
Called in RX 

## 2013-12-12 NOTE — Telephone Encounter (Signed)
Last visit 10/31/13 Last refill 08-25-13 #30 0 refill

## 2013-12-12 NOTE — Telephone Encounter (Signed)
Refill OK

## 2014-01-30 ENCOUNTER — Other Ambulatory Visit: Payer: Self-pay | Admitting: Family Medicine

## 2014-02-06 ENCOUNTER — Other Ambulatory Visit: Payer: Self-pay | Admitting: Family Medicine

## 2014-04-27 ENCOUNTER — Encounter: Payer: Self-pay | Admitting: Family Medicine

## 2014-05-01 ENCOUNTER — Other Ambulatory Visit: Payer: Self-pay | Admitting: Family Medicine

## 2014-05-01 DIAGNOSIS — R05 Cough: Secondary | ICD-10-CM

## 2014-05-01 DIAGNOSIS — R053 Chronic cough: Secondary | ICD-10-CM

## 2014-05-04 ENCOUNTER — Institutional Professional Consult (permissible substitution): Payer: BC Managed Care – PPO | Admitting: Emergency Medicine

## 2014-05-31 ENCOUNTER — Ambulatory Visit (INDEPENDENT_AMBULATORY_CARE_PROVIDER_SITE_OTHER)
Admission: RE | Admit: 2014-05-31 | Discharge: 2014-05-31 | Disposition: A | Discharge status: Home / Self Care | Payer: BC Managed Care – PPO | Source: Ambulatory Visit | Attending: Emergency Medicine | Admitting: Emergency Medicine

## 2014-05-31 ENCOUNTER — Encounter: Payer: Self-pay | Admitting: Emergency Medicine

## 2014-05-31 ENCOUNTER — Ambulatory Visit (INDEPENDENT_AMBULATORY_CARE_PROVIDER_SITE_OTHER): Payer: BC Managed Care – PPO | Admitting: Emergency Medicine

## 2014-05-31 VITALS — BP 118/80 | HR 55 | Ht 64.5 in | Wt 178.2 lb

## 2014-05-31 DIAGNOSIS — R059 Cough, unspecified: Secondary | ICD-10-CM | POA: Insufficient documentation

## 2014-05-31 DIAGNOSIS — R05 Cough: Secondary | ICD-10-CM

## 2014-05-31 MED ORDER — HYDROCODONE-HOMATROPINE 5-1.5 MG/5ML PO SYRP: 5.0000 mL | ORAL_SOLUTION | Freq: Four times a day (QID) | ORAL | Status: DC | PRN

## 2014-05-31 MED ORDER — BENZONATATE 200 MG PO CAPS: 200.0000 mg | ORAL_CAPSULE | Freq: Three times a day (TID) | ORAL | Status: DC | PRN

## 2014-05-31 MED ORDER — HYDROCOD POLST-CHLORPHEN POLST 10-8 MG/5ML PO LQCR: 5.0000 mL | Freq: Two times a day (BID) | ORAL | Status: DC | PRN

## 2014-05-31 NOTE — Assessment & Plan Note (Signed)
Please increase your pantoprazole to twice a day for 10 days and then go back to once a day Start your Qnasl every day Continue your loratadine (Claritin) every day Try using chlorpheniramine 4mg  every evening  Try using nasal saline washes daily if able Avoid cough drops - use sugar free candies.  Practice voice rest and cough suppression  We will schedule full PFT at your return visit CXR today Follow with Dr Lamonte Sakai next available with full PFT

## 2014-05-31 NOTE — Progress Notes (Signed)
Subjective:    Patient ID: Theresa Bradley, female    DOB: 1951-11-08, 63 y.o.   MRN: [id number]  HPI 63 yo woman, never smoker, hx HTN, allergic rhinitis on immunotherapy, hypothyroidism. She is referred for chronic cough that dates back for many years. Several things have been tried > stopped lisinopril, started pantoprazole. She had 2 periods this spring when the cough was more productive, treated x 1 w abx without much change. Her sense of smell has worsened over the last several months. She is on loratadine daily, uses qnasl prn. Currently on pantoprazole. Last CXR was 2013. No PFT's yet.    Review of Systems  Constitutional: Negative for fever and unexpected weight change.  HENT: Positive for dental problem, postnasal drip and sore throat. Negative for congestion, ear pain, nosebleeds, rhinorrhea, sinus pressure, sneezing and trouble swallowing.   Eyes: Negative for redness and itching.  Respiratory: Positive for cough. Negative for chest tightness and wheezing.   Cardiovascular: Positive for palpitations. Negative for leg swelling.  Gastrointestinal: Positive for abdominal distention. Negative for nausea and vomiting.  Genitourinary: Negative for dysuria.  Musculoskeletal: Positive for back pain. Negative for joint swelling.  Skin: Negative for rash.  Neurological: Negative for headaches.  Hematological: Does not bruise/bleed easily.  Psychiatric/Behavioral: Negative for dysphoric mood. The patient is nervous/anxious.    Past Medical History  Diagnosis Date  . HYPOTHYROIDISM 04/16/2009  . HYPERTENSION 04/16/2009  . SENILE LENTIGO 06/10/2010  . OSTEOARTHRITIS 04/16/2009  . COUGH, CHRONIC 05/10/2010     Family History  Problem Relation Age of Onset  . Arthritis Other   . Hypertension Other   . Cancer Other     2 aunts  . Hyperlipidemia Father      History   Social History  . Marital Status: Married    Spouse Name: N/A    Number of Children: N/A  . Years of Education: N/A    Occupational History  . Not on file.   Social History Main Topics  . Smoking status: Never Smoker   . Smokeless tobacco: Not on file  . Alcohol Use: Not on file  . Drug Use: Not on file  . Sexual Activity: Not on file   Other Topics Concern  . Not on file   Social History Narrative  . No narrative on file     Allergies  Allergen Reactions  . Codeine Sulfate   . Erythromycin Base   . Esomeprazole Magnesium     REACTION: GI upset     Outpatient Prescriptions Prior to Visit  Medication Sig Dispense Refill  . ALPRAZolam (XANAX) 0.5 MG tablet TAKE 1 TABLET EVERY DAY AT BEDTIME AS NEEDED FOR SLEEP OR ANXIETY  30 tablet  0  . aspirin 81 MG tablet Take 81 mg by mouth daily.        . citalopram (CELEXA) 20 MG tablet Take 20 mg by mouth daily. 1/2 tab daily      . levothyroxine (SYNTHROID, LEVOTHROID) 50 MCG tablet TAKE 1 TABLET EVERY DAY  90 tablet  3  . loratadine (CLARITIN) 10 MG tablet Take 10 mg by mouth daily.        Marland Kitchen losartan-hydrochlorothiazide (HYZAAR) 100-12.5 MG per tablet TAKE 1 TABLET BY MOUTH EVERY DAY  60 tablet  3  . meloxicam (MOBIC) 15 MG tablet TAKE 1 TABLET BY MOUTH ONCE DAILY  90 tablet  3  . pantoprazole (PROTONIX) 40 MG tablet TAKE 1 TABLET BY MOUTH EVERY DAY  30 tablet  5  .  tretinoin (RETIN-A) 0.05 % cream Apply topically as needed.       . citalopram (CELEXA) 20 MG tablet TAKE 1 TABLET EVERY DAY  90 tablet  1  . desoximetasone (TOPICORT) 0.25 % cream Apply 1 application topically 2 (two) times daily.  30 g  1  . fluticasone (FLONASE) 50 MCG/ACT nasal spray Place 1 spray into the nose as needed.  16 g  11  . SYNTHROID 50 MCG tablet TAKE 1 TABLET BY MOUTH DAILY BEFORE BREAKFAST.  90 tablet  3   No facility-administered medications prior to visit.        Objective:   Physical Exam Filed Vitals:   05/31/14 1120  BP: 118/80  Pulse: 55  Height: 5' 4.5" (1.638 m)  Weight: 178 lb 3.2 oz (80.831 kg)  SpO2: 95%   Gen: Pleasant, well-nourished, in no  distress,  normal affect  ENT: No lesions,  mouth clear,  oropharynx clear, no postnasal drip  Neck: No JVD, no TMG, no carotid bruits  Lungs: No use of accessory muscles, no dullness to percussion, clear without rales or rhonchi  Cardiovascular: RRR, heart sounds normal, no murmur or gallops, no peripheral edema  Musculoskeletal: No deformities, no cyanosis or clubbing  Neuro: alert, non focal  Skin: Warm, no lesions or rashes      Assessment & Plan:  Cough Please increase your pantoprazole to twice a day for 10 days and then go back to once a day Start your Qnasl every day Continue your loratadine (Claritin) every day Try using chlorpheniramine 4mg  every evening  Try using nasal saline washes daily if able Avoid cough drops - use sugar free candies.  Practice voice rest and cough suppression  We will schedule full PFT at your return visit CXR today Follow with Dr Lamonte Sakai next available with full PFT

## 2014-05-31 NOTE — Patient Instructions (Signed)
Please increase your pantoprazole to twice a day for 10 days and then go back to once a day Start your Qnasl every day Continue your loratadine (Claritin) every day Try using chlorpheniramine 4mg  every evening  Try using nasal saline washes daily if able Avoid cough drops - use sugar free candies.  Practice voice rest and cough suppression  We will schedule full PFT at your return visit CXR today Follow with Dr Lamonte Sakai next available with full PFT

## 2014-06-16 ENCOUNTER — Other Ambulatory Visit: Payer: Self-pay | Admitting: Family Medicine

## 2014-06-16 NOTE — Telephone Encounter (Signed)
Refill OK

## 2014-06-16 NOTE — Telephone Encounter (Signed)
Last visit 10/31/13 Last refill 12/12/13 #30 0 refill

## 2014-06-30 ENCOUNTER — Ambulatory Visit (INDEPENDENT_AMBULATORY_CARE_PROVIDER_SITE_OTHER): Payer: Medicare Other | Admitting: Emergency Medicine

## 2014-06-30 ENCOUNTER — Encounter: Payer: Self-pay | Admitting: Emergency Medicine

## 2014-06-30 VITALS — BP 124/70 | HR 73 | Ht 63.0 in | Wt 178.0 lb

## 2014-06-30 DIAGNOSIS — R059 Cough, unspecified: Secondary | ICD-10-CM

## 2014-06-30 DIAGNOSIS — R05 Cough: Secondary | ICD-10-CM

## 2014-06-30 LAB — PULMONARY FUNCTION TEST
DL/VA % PRED: 124 %
DL/VA: 5.81 ml/min/mmHg/L
DLCO UNC % PRED: 104 %
DLCO UNC: 23.99 ml/min/mmHg
FEF 25-75 PRE: 1.81 L/s
FEF 25-75 Post: 2.3 L/sec
FEF2575-%Change-Post: 26 %
FEF2575-%Pred-Post: 110 %
FEF2575-%Pred-Pre: 87 %
FEV1-%CHANGE-POST: 5 %
FEV1-%Pred-Post: 91 %
FEV1-%Pred-Pre: 87 %
FEV1-Post: 2.13 L
FEV1-Pre: 2.02 L
FEV1FVC-%Change-Post: 3 %
FEV1FVC-%PRED-PRE: 103 %
FEV6-%Change-Post: 2 %
FEV6-%PRED-POST: 89 %
FEV6-%PRED-PRE: 86 %
FEV6-POST: 2.6 L
FEV6-Pre: 2.54 L
FEV6FVC-%PRED-POST: 104 %
FEV6FVC-%PRED-PRE: 104 %
FVC-%Change-Post: 2 %
FVC-%PRED-PRE: 83 %
FVC-%Pred-Post: 85 %
FVC-POST: 2.6 L
FVC-Pre: 2.55 L
Post FEV1/FVC ratio: 82 %
Post FEV6/FVC ratio: 100 %
Pre FEV1/FVC ratio: 79 %
Pre FEV6/FVC Ratio: 100 %
RV % pred: 99 %
RV: 2.02 L
TLC % PRED: 94 %
TLC: 4.61 L

## 2014-06-30 MED ORDER — PANTOPRAZOLE SODIUM 40 MG PO TBEC: 40.0000 mg | DELAYED_RELEASE_TABLET | Freq: Two times a day (BID) | ORAL | Status: DC

## 2014-06-30 NOTE — Assessment & Plan Note (Signed)
We will continue your current allergy routine Please increase your pantoprazole to twice a day, we will call in a new prescription We will change your Qnasl to Dymista 1 spray each nostril twice a day If your cough continues then we will plan to perform bronchoscopy Follow with Dr Lamonte Sakai in 2 months or sooner if you have any problems.

## 2014-06-30 NOTE — Patient Instructions (Addendum)
We will continue your current allergy routine Please increase your pantoprazole to twice a day, we will call in a new prescription We will change your Qnasl to Dymista 1 spray each nostril twice a day If your cough continues then we will plan to perform bronchoscopy Follow with Dr Lamonte Sakai in 2 months or sooner if you have any problems.

## 2014-06-30 NOTE — Progress Notes (Signed)
PFT done today. 

## 2014-06-30 NOTE — Progress Notes (Signed)
   Subjective:    Patient ID: Theresa Bradley, female    DOB: [birth year], 63 y.o.   MRN: [id number]  HPI 63 yo woman, never smoker, hx HTN, allergic rhinitis on immunotherapy, hypothyroidism. She is referred for chronic cough that dates back for many years. Several things have been tried > stopped lisinopril, started pantoprazole. She had 2 periods this spring when the cough was more productive, treated x 1 w abx without much change. Her sense of smell has worsened over the last several months. She is on loratadine daily, uses qnasl prn. Currently on pantoprazole. Last CXR was 2013. No PFT's yet.   ROV 06/30/14 -- follow up visit for chronic cough. She underwent PFT that showed normal AF, no BD response, normal volumes and DLCO, possible curve to the F/V loop. She was able to do the cyclical cough protocol. Feels that it did decrease her cough, but now starting to ramp up again. She is still on the allergy regimen but not NSW, PPI is now once a day.    Review of Systems  Constitutional: Negative for fever and unexpected weight change.  HENT: Positive for dental problem, postnasal drip and sore throat. Negative for congestion, ear pain, nosebleeds, rhinorrhea, sinus pressure, sneezing and trouble swallowing.   Eyes: Negative for redness and itching.  Respiratory: Positive for cough. Negative for chest tightness and wheezing.   Cardiovascular: Positive for palpitations. Negative for leg swelling.  Gastrointestinal: Positive for abdominal distention. Negative for nausea and vomiting.  Genitourinary: Negative for dysuria.  Musculoskeletal: Positive for back pain. Negative for joint swelling.  Skin: Negative for rash.  Neurological: Negative for headaches.  Hematological: Does not bruise/bleed easily.  Psychiatric/Behavioral: Negative for dysphoric mood. The patient is nervous/anxious.       Objective:   Physical Exam Filed Vitals:   06/30/14 1553  BP: 124/70  Pulse: 73  Height: 5\' 3"  (1.6 m)    Weight: 178 lb (80.74 kg)  SpO2: 94%   Gen: Pleasant, well-nourished, in no distress,  normal affect  ENT: No lesions,  mouth clear,  oropharynx clear, no postnasal drip  Neck: No JVD, no TMG, no carotid bruits  Lungs: No use of accessory muscles, no dullness to percussion, clear without rales or rhonchi  Cardiovascular: RRR, heart sounds normal, no murmur or gallops, no peripheral edema  Musculoskeletal: No deformities, no cyanosis or clubbing  Neuro: alert, non focal  Skin: Warm, no lesions or rashes      Assessment & Plan:  Cough We will continue your current allergy routine Please increase your pantoprazole to twice a day, we will call in a new prescription We will change your Qnasl to Dymista 1 spray each nostril twice a day If your cough continues then we will plan to perform bronchoscopy Follow with Dr Lamonte Sakai in 2 months or sooner if you have any problems.

## 2014-07-03 ENCOUNTER — Other Ambulatory Visit: Payer: Self-pay | Admitting: Family Medicine

## 2014-07-03 DIAGNOSIS — Z853 Personal history of malignant neoplasm of breast: Secondary | ICD-10-CM

## 2014-07-11 ENCOUNTER — Ambulatory Visit
Admission: RE | Admit: 2014-07-11 | Discharge: 2014-07-11 | Disposition: A | Discharge status: Home / Self Care | Payer: Medicare Other | Source: Ambulatory Visit | Attending: Family Medicine | Admitting: Family Medicine

## 2014-07-11 DIAGNOSIS — Z853 Personal history of malignant neoplasm of breast: Secondary | ICD-10-CM

## 2014-08-21 ENCOUNTER — Other Ambulatory Visit: Payer: Self-pay | Admitting: Family Medicine

## 2014-08-25 ENCOUNTER — Encounter: Payer: Self-pay | Admitting: Emergency Medicine

## 2014-09-01 ENCOUNTER — Ambulatory Visit (INDEPENDENT_AMBULATORY_CARE_PROVIDER_SITE_OTHER): Payer: Medicare Other

## 2014-09-01 DIAGNOSIS — Z23 Encounter for immunization: Secondary | ICD-10-CM

## 2014-09-21 ENCOUNTER — Encounter: Payer: Self-pay | Admitting: Emergency Medicine

## 2014-09-21 ENCOUNTER — Other Ambulatory Visit (INDEPENDENT_AMBULATORY_CARE_PROVIDER_SITE_OTHER): Payer: Medicare Other

## 2014-09-21 ENCOUNTER — Ambulatory Visit (INDEPENDENT_AMBULATORY_CARE_PROVIDER_SITE_OTHER): Payer: Medicare Other | Admitting: Emergency Medicine

## 2014-09-21 VITALS — BP 112/84 | HR 66 | Temp 97.4°F | Ht 64.0 in | Wt 178.4 lb

## 2014-09-21 DIAGNOSIS — E785 Hyperlipidemia, unspecified: Secondary | ICD-10-CM

## 2014-09-21 DIAGNOSIS — R05 Cough: Secondary | ICD-10-CM

## 2014-09-21 DIAGNOSIS — Z0000 Encounter for general adult medical examination without abnormal findings: Secondary | ICD-10-CM

## 2014-09-21 DIAGNOSIS — R059 Cough, unspecified: Secondary | ICD-10-CM

## 2014-09-21 LAB — LIPID PANEL
CHOL/HDL RATIO: 5
Cholesterol: 211 mg/dL — ABNORMAL HIGH (ref 0–200)
HDL: 45.7 mg/dL (ref >39.00)
LDL CALC: 133 mg/dL — AB (ref 0–99)
NONHDL: 165.3
Triglycerides: 161 mg/dL — ABNORMAL HIGH (ref 0.0–149.0)
VLDL: 32.2 mg/dL (ref 0.0–40.0)

## 2014-09-21 LAB — POCT URINALYSIS DIPSTICK
Bilirubin, UA: NEGATIVE
Blood, UA: NEGATIVE
GLUCOSE UA: NEGATIVE
Ketones, UA: NEGATIVE
LEUKOCYTES UA: NEGATIVE
Nitrite, UA: NEGATIVE
Protein, UA: NEGATIVE
SPEC GRAV UA: 1.015
UROBILINOGEN UA: 0.2
pH, UA: 5.5

## 2014-09-21 LAB — HEPATIC FUNCTION PANEL
ALT: 26 U/L (ref 0–35)
AST: 24 U/L (ref 0–37)
Albumin: 3.5 g/dL (ref 3.5–5.2)
Alkaline Phosphatase: 70 U/L (ref 39–117)
BILIRUBIN DIRECT: 0.1 mg/dL (ref 0.0–0.3)
BILIRUBIN TOTAL: 1 mg/dL (ref 0.2–1.2)
Total Protein: 7.8 g/dL (ref 6.0–8.3)

## 2014-09-21 LAB — CBC WITH DIFFERENTIAL/PLATELET
BASOS ABS: 0.1 10*3/uL (ref 0.0–0.1)
Basophils Relative: 0.6 % (ref 0.0–3.0)
EOS ABS: 0.3 10*3/uL (ref 0.0–0.7)
EOS PCT: 2.9 % (ref 0.0–5.0)
HEMATOCRIT: 42.4 % (ref 36.0–46.0)
Hemoglobin: 13.8 g/dL (ref 12.0–15.0)
Lymphocytes Relative: 19.1 % (ref 12.0–46.0)
Lymphs Abs: 1.6 10*3/uL (ref 0.7–4.0)
MCHC: 32.6 g/dL (ref 30.0–36.0)
MCV: 89.3 fl (ref 78.0–100.0)
Monocytes Absolute: 0.7 10*3/uL (ref 0.1–1.0)
Monocytes Relative: 7.9 % (ref 3.0–12.0)
Neutro Abs: 6 10*3/uL (ref 1.4–7.7)
Neutrophils Relative %: 69.5 % (ref 43.0–77.0)
Platelets: 381 10*3/uL (ref 150.0–400.0)
RBC: 4.75 Mil/uL (ref 3.87–5.11)
RDW: 13.4 % (ref 11.5–15.5)
WBC: 8.6 10*3/uL (ref 4.0–10.5)

## 2014-09-21 LAB — BASIC METABOLIC PANEL
BUN: 11 mg/dL (ref 6–23)
CHLORIDE: 102 meq/L (ref 96–112)
CO2: 21 mEq/L (ref 19–32)
Calcium: 9.2 mg/dL (ref 8.4–10.5)
Creatinine, Ser: 0.9 mg/dL (ref 0.4–1.2)
GFR: 69.4 mL/min (ref >60.00)
Glucose, Bld: 93 mg/dL (ref 70–99)
POTASSIUM: 3.5 meq/L (ref 3.5–5.1)
Sodium: 139 mEq/L (ref 135–145)

## 2014-09-21 LAB — TSH: TSH: 2.34 u[IU]/mL (ref 0.35–4.50)

## 2014-09-21 NOTE — Progress Notes (Signed)
   Subjective:    Patient ID: Theresa Bradley, female    DOB: 02/20/1951, 63 y.o.   MRN: [id number]  HPI 63 yo woman, never smoker, hx HTN, allergic rhinitis on immunotherapy, hypothyroidism. She is referred for chronic cough that dates back for many years. Several things have been tried > stopped lisinopril, started pantoprazole. She had 2 periods this spring when the cough was more productive, treated x 1 w abx without much change. Her sense of smell has worsened over the last several months. She is on loratadine daily, uses qnasl prn. Currently on pantoprazole. Last CXR was 2013. No PFT's yet.   ROV 06/30/14 -- follow up visit for chronic cough. She underwent PFT that showed normal AF, no BD response, normal volumes and DLCO, possible curve to the F/V loop. She was able to do the cyclical cough protocol. Feels that it did decrease her cough, but now starting to ramp up again. She is still on the allergy regimen but not NSW, PPI is now once a day.   ROV 09/21/14 -- follow up for cough. Last time we increased her PPI, changed qnasl to dymista. The dymista didn't make much difference. She is using qnasl but not every day, does take loratadine daily. She was just started on omnicef for    Review of Systems  Constitutional: Negative for fever and unexpected weight change.  HENT: Positive for postnasal drip. Negative for congestion, dental problem, ear pain, nosebleeds, rhinorrhea, sinus pressure, sneezing, sore throat and trouble swallowing.   Eyes: Negative for redness and itching.  Respiratory: Positive for cough. Negative for chest tightness and wheezing.   Cardiovascular: Negative for palpitations and leg swelling.  Gastrointestinal: Negative for nausea, vomiting and abdominal distention.  Genitourinary: Negative for dysuria.  Musculoskeletal: Negative for back pain and joint swelling.  Skin: Negative for rash.  Neurological: Negative for headaches.  Hematological: Does not bruise/bleed easily.    Psychiatric/Behavioral: Negative for dysphoric mood. The patient is not nervous/anxious.       Objective:   Physical Exam Filed Vitals:   09/21/14 1611  BP: 112/84  Pulse: 66  Temp: 97.4 F (36.3 C)  TempSrc: Oral  Height: 5\' 4"  (1.626 m)  Weight: 178 lb 6.4 oz (80.922 kg)  SpO2: 96%   Gen: Pleasant, well-nourished, in no distress,  normal affect  ENT: No lesions,  mouth clear,  oropharynx clear, no postnasal drip  Neck: No JVD, no TMG, no carotid bruits  Lungs: No use of accessory muscles, no dullness to percussion, clear without rales or rhonchi  Cardiovascular: RRR, heart sounds normal, no murmur or gallops, no peripheral edema  Musculoskeletal: No deformities, no cyanosis or clubbing  Neuro: alert, non focal  Skin: Warm, no lesions or rashes      Assessment & Plan:  Cough Seems to be impacted by her rhinitis and GERD. We have moderate control but not perfect. She is currently being rx for sinusitis. If she has recurrent sx she may need to see ENT. I have asked her to call me if her sx change.

## 2014-09-21 NOTE — Patient Instructions (Signed)
Continue your medications as you are taking them Finish the [place] as planned  Follow with Dr Lamonte Sakai if you cough worsens or if you have any changes in your breathing.

## 2014-09-21 NOTE — Assessment & Plan Note (Signed)
Seems to be impacted by her rhinitis and GERD. We have moderate control but not perfect. She is currently being rx for sinusitis. If she has recurrent sx she may need to see ENT. I have asked her to call me if her sx change.

## 2014-09-29 ENCOUNTER — Encounter: Payer: Self-pay | Admitting: Family Medicine

## 2014-09-29 ENCOUNTER — Ambulatory Visit (INDEPENDENT_AMBULATORY_CARE_PROVIDER_SITE_OTHER): Payer: Medicare Other | Admitting: Family Medicine

## 2014-09-29 VITALS — BP 130/80 | HR 60 | Temp 97.8°F | Ht 64.0 in | Wt 174.0 lb

## 2014-09-29 DIAGNOSIS — Z23 Encounter for immunization: Secondary | ICD-10-CM

## 2014-09-29 DIAGNOSIS — Z0000 Encounter for general adult medical examination without abnormal findings: Secondary | ICD-10-CM

## 2014-09-29 MED ORDER — TRAMADOL HCL 50 MG PO TABS: 50.0000 mg | ORAL_TABLET | Freq: Two times a day (BID) | ORAL | Status: DC | PRN

## 2014-09-29 NOTE — Progress Notes (Signed)
Pre visit review using our clinic review tool, if applicable. No additional management support is needed unless otherwise documented below in the visit note. 

## 2014-09-29 NOTE — Progress Notes (Signed)
Subjective:    Patient ID: Theresa Bradley, female    DOB: [birth year], 63 y.o.   MRN: [id number]  HPI   Patient here for complete physical. Last year she had some vaginal spotting and ultrasound revealed some uterine fibroids and abnormal thickening. She was sent to gynecologist had endometrial biopsy which was negative. She has not had any spotting since then. Pap smear last year was normal.  Chronic problems include history of hypertension, chronic cough, GERD, hypothyroidism, osteoarthritis. She has seen pulmonologist during past year has been tried on several things her cough has persisted. She tried doubling up her acid reducer but this did not seem to make much difference. She has some occasional postnasal drip symptoms still. Cough is dry and daily. No appetite or weight changes. Never smoked.  Immunizations reviewed. She's are had flu vaccine. Needs Prevnar 13. Shingles up-to-date. Tetanus up-to-date. Colonoscopy up-to-date.  Past Medical History  Diagnosis Date  . HYPOTHYROIDISM 04/16/2009  . HYPERTENSION 04/16/2009  . SENILE LENTIGO 06/10/2010  . OSTEOARTHRITIS 04/16/2009  . COUGH, CHRONIC 05/10/2010   Past Surgical History  Procedure Laterality Date  . Cholecystectomy    . Breast surgery  2009    X 2, cancer stage 0, lumpectomy  . Joint replacement  2008    hip  . Hernia repair  2009  . Cesarean section      1979, 1982    reports that she has never smoked. She does not have any smokeless tobacco history on file. Her alcohol and drug histories are not on file. family history includes Arthritis in her other; Cancer in her other; Hyperlipidemia in her father; Hypertension in her other. Allergies  Allergen Reactions  . Codeine Sulfate   . Erythromycin Base   . Esomeprazole Magnesium     REACTION: GI upset      Review of Systems  Constitutional: Negative for fever, activity change, appetite change, fatigue and unexpected weight change.  HENT: Positive for postnasal drip.  Negative for ear pain, hearing loss, sore throat and trouble swallowing.   Eyes: Negative for visual disturbance.  Respiratory: Positive for cough (chronic). Negative for shortness of breath and wheezing.   Cardiovascular: Negative for chest pain and palpitations.  Gastrointestinal: Negative for abdominal pain, diarrhea, constipation and blood in stool.  Genitourinary: Negative for dysuria and hematuria.  Musculoskeletal: Negative for myalgias, back pain and arthralgias.  Skin: Negative for rash.  Neurological: Negative for dizziness, syncope and headaches.  Hematological: Negative for adenopathy.  Psychiatric/Behavioral: Negative for confusion and dysphoric mood.       Objective:   Physical Exam  Constitutional: She is oriented to person, place, and time. She appears well-developed and well-nourished. No distress.  HENT:  Head: Normocephalic and atraumatic.  Right Ear: External ear normal.  Left Ear: External ear normal.  Mouth/Throat: Oropharynx is clear and moist.  Neck: Neck supple. No thyromegaly present.  Cardiovascular: Normal rate and regular rhythm.   Pulmonary/Chest: Effort normal and breath sounds normal. No respiratory distress. She has no wheezes. She has no rales.  Abdominal: Soft. Bowel sounds are normal. She exhibits no distension and no mass. There is no tenderness. There is no rebound and no guarding.  Musculoskeletal: She exhibits no edema.  Neurological: She is alert and oriented to person, place, and time.  Skin: No rash noted.  Psychiatric: She has a normal mood and affect. Her behavior is normal.          Assessment & Plan:  #1 health maintenance. Prevnar 13  given. Flu vaccine already given. Colonoscopy up-to-date. Tetanus up-to-date. We discussed DEXA scanning and she wishes to wait #2 chronic cough. She's had extensive workup per pulmonary. She has some postnasal drip symptoms and we recommended over-the-counter chlorpheniramine one at night. If this is  not relieving her cough consider trial of tramadol 50 mg every 12 hours as needed

## 2014-09-29 NOTE — Patient Instructions (Signed)
Consider over the counter chlorpheniramine or brompheniramine at night for postnasal drip symptoms.

## 2014-10-02 ENCOUNTER — Other Ambulatory Visit: Payer: Self-pay | Admitting: Emergency Medicine

## 2014-10-02 ENCOUNTER — Other Ambulatory Visit: Payer: Self-pay | Admitting: *Deleted

## 2014-10-30 ENCOUNTER — Other Ambulatory Visit: Payer: Self-pay | Admitting: Family Medicine

## 2014-10-31 NOTE — Telephone Encounter (Signed)
Refill once 

## 2014-10-31 NOTE — Telephone Encounter (Signed)
Last visit 09/29/14 Last refill 12/12/13 #30 0 refill

## 2014-11-15 ENCOUNTER — Other Ambulatory Visit: Payer: Self-pay | Admitting: Family Medicine

## 2014-11-30 DIAGNOSIS — J3081 Allergic rhinitis due to animal (cat) (dog) hair and dander: Secondary | ICD-10-CM | POA: Diagnosis not present

## 2014-11-30 DIAGNOSIS — J3089 Other allergic rhinitis: Secondary | ICD-10-CM | POA: Diagnosis not present

## 2014-11-30 DIAGNOSIS — J301 Allergic rhinitis due to pollen: Secondary | ICD-10-CM | POA: Diagnosis not present

## 2014-12-07 DIAGNOSIS — J3089 Other allergic rhinitis: Secondary | ICD-10-CM | POA: Diagnosis not present

## 2014-12-07 DIAGNOSIS — J301 Allergic rhinitis due to pollen: Secondary | ICD-10-CM | POA: Diagnosis not present

## 2014-12-07 DIAGNOSIS — J3081 Allergic rhinitis due to animal (cat) (dog) hair and dander: Secondary | ICD-10-CM | POA: Diagnosis not present

## 2014-12-14 DIAGNOSIS — J3089 Other allergic rhinitis: Secondary | ICD-10-CM | POA: Diagnosis not present

## 2014-12-14 DIAGNOSIS — J301 Allergic rhinitis due to pollen: Secondary | ICD-10-CM | POA: Diagnosis not present

## 2014-12-25 ENCOUNTER — Other Ambulatory Visit: Payer: Self-pay | Admitting: Family Medicine

## 2014-12-25 DIAGNOSIS — H2513 Age-related nuclear cataract, bilateral: Secondary | ICD-10-CM | POA: Diagnosis not present

## 2014-12-25 DIAGNOSIS — H40023 Open angle with borderline findings, high risk, bilateral: Secondary | ICD-10-CM | POA: Diagnosis not present

## 2014-12-28 DIAGNOSIS — J3089 Other allergic rhinitis: Secondary | ICD-10-CM | POA: Diagnosis not present

## 2014-12-28 DIAGNOSIS — J301 Allergic rhinitis due to pollen: Secondary | ICD-10-CM | POA: Diagnosis not present

## 2014-12-28 DIAGNOSIS — J3081 Allergic rhinitis due to animal (cat) (dog) hair and dander: Secondary | ICD-10-CM | POA: Diagnosis not present

## 2015-01-05 DIAGNOSIS — J3089 Other allergic rhinitis: Secondary | ICD-10-CM | POA: Diagnosis not present

## 2015-01-05 DIAGNOSIS — J3081 Allergic rhinitis due to animal (cat) (dog) hair and dander: Secondary | ICD-10-CM | POA: Diagnosis not present

## 2015-01-05 DIAGNOSIS — J301 Allergic rhinitis due to pollen: Secondary | ICD-10-CM | POA: Diagnosis not present

## 2015-01-11 DIAGNOSIS — J301 Allergic rhinitis due to pollen: Secondary | ICD-10-CM | POA: Diagnosis not present

## 2015-01-11 DIAGNOSIS — J3081 Allergic rhinitis due to animal (cat) (dog) hair and dander: Secondary | ICD-10-CM | POA: Diagnosis not present

## 2015-01-11 DIAGNOSIS — J3089 Other allergic rhinitis: Secondary | ICD-10-CM | POA: Diagnosis not present

## 2015-01-22 ENCOUNTER — Other Ambulatory Visit: Payer: Self-pay | Admitting: Family Medicine

## 2015-01-22 DIAGNOSIS — J3089 Other allergic rhinitis: Secondary | ICD-10-CM | POA: Diagnosis not present

## 2015-01-22 DIAGNOSIS — J301 Allergic rhinitis due to pollen: Secondary | ICD-10-CM | POA: Diagnosis not present

## 2015-01-22 DIAGNOSIS — J3081 Allergic rhinitis due to animal (cat) (dog) hair and dander: Secondary | ICD-10-CM | POA: Diagnosis not present

## 2015-01-25 ENCOUNTER — Other Ambulatory Visit: Payer: Self-pay | Admitting: Family Medicine

## 2015-01-25 ENCOUNTER — Encounter: Payer: Self-pay | Admitting: Family Medicine

## 2015-01-25 DIAGNOSIS — R0989 Other specified symptoms and signs involving the circulatory and respiratory systems: Secondary | ICD-10-CM

## 2015-01-25 DIAGNOSIS — R059 Cough, unspecified: Secondary | ICD-10-CM

## 2015-01-25 DIAGNOSIS — R05 Cough: Secondary | ICD-10-CM

## 2015-01-31 DIAGNOSIS — J301 Allergic rhinitis due to pollen: Secondary | ICD-10-CM | POA: Diagnosis not present

## 2015-01-31 DIAGNOSIS — J3081 Allergic rhinitis due to animal (cat) (dog) hair and dander: Secondary | ICD-10-CM | POA: Diagnosis not present

## 2015-01-31 DIAGNOSIS — J3089 Other allergic rhinitis: Secondary | ICD-10-CM | POA: Diagnosis not present

## 2015-02-07 DIAGNOSIS — J3081 Allergic rhinitis due to animal (cat) (dog) hair and dander: Secondary | ICD-10-CM | POA: Diagnosis not present

## 2015-02-07 DIAGNOSIS — J3089 Other allergic rhinitis: Secondary | ICD-10-CM | POA: Diagnosis not present

## 2015-02-07 DIAGNOSIS — J301 Allergic rhinitis due to pollen: Secondary | ICD-10-CM | POA: Diagnosis not present

## 2015-02-14 DIAGNOSIS — J3089 Other allergic rhinitis: Secondary | ICD-10-CM | POA: Diagnosis not present

## 2015-02-14 DIAGNOSIS — K219 Gastro-esophageal reflux disease without esophagitis: Secondary | ICD-10-CM | POA: Diagnosis not present

## 2015-02-14 DIAGNOSIS — J301 Allergic rhinitis due to pollen: Secondary | ICD-10-CM | POA: Diagnosis not present

## 2015-02-14 DIAGNOSIS — J309 Allergic rhinitis, unspecified: Secondary | ICD-10-CM | POA: Diagnosis not present

## 2015-02-14 DIAGNOSIS — J3081 Allergic rhinitis due to animal (cat) (dog) hair and dander: Secondary | ICD-10-CM | POA: Diagnosis not present

## 2015-02-14 DIAGNOSIS — J324 Chronic pansinusitis: Secondary | ICD-10-CM | POA: Diagnosis not present

## 2015-02-14 DIAGNOSIS — R05 Cough: Secondary | ICD-10-CM | POA: Diagnosis not present

## 2015-02-23 DIAGNOSIS — J301 Allergic rhinitis due to pollen: Secondary | ICD-10-CM | POA: Diagnosis not present

## 2015-02-23 DIAGNOSIS — J3081 Allergic rhinitis due to animal (cat) (dog) hair and dander: Secondary | ICD-10-CM | POA: Diagnosis not present

## 2015-02-23 DIAGNOSIS — J3089 Other allergic rhinitis: Secondary | ICD-10-CM | POA: Diagnosis not present

## 2015-02-28 ENCOUNTER — Other Ambulatory Visit: Payer: Self-pay | Admitting: Family Medicine

## 2015-02-28 NOTE — Telephone Encounter (Signed)
Last visit 09/29/14 Last refill 11/01/14 #60 0 refill

## 2015-03-01 DIAGNOSIS — J3089 Other allergic rhinitis: Secondary | ICD-10-CM | POA: Diagnosis not present

## 2015-03-01 DIAGNOSIS — J301 Allergic rhinitis due to pollen: Secondary | ICD-10-CM | POA: Diagnosis not present

## 2015-03-01 DIAGNOSIS — J3081 Allergic rhinitis due to animal (cat) (dog) hair and dander: Secondary | ICD-10-CM | POA: Diagnosis not present

## 2015-03-01 NOTE — Telephone Encounter (Signed)
Refill once.  Avoid regular use. 

## 2015-03-07 DIAGNOSIS — J3081 Allergic rhinitis due to animal (cat) (dog) hair and dander: Secondary | ICD-10-CM | POA: Diagnosis not present

## 2015-03-07 DIAGNOSIS — J3089 Other allergic rhinitis: Secondary | ICD-10-CM | POA: Diagnosis not present

## 2015-03-07 DIAGNOSIS — J301 Allergic rhinitis due to pollen: Secondary | ICD-10-CM | POA: Diagnosis not present

## 2015-03-14 DIAGNOSIS — J301 Allergic rhinitis due to pollen: Secondary | ICD-10-CM | POA: Diagnosis not present

## 2015-03-14 DIAGNOSIS — J3089 Other allergic rhinitis: Secondary | ICD-10-CM | POA: Diagnosis not present

## 2015-03-14 DIAGNOSIS — J3081 Allergic rhinitis due to animal (cat) (dog) hair and dander: Secondary | ICD-10-CM | POA: Diagnosis not present

## 2015-03-22 DIAGNOSIS — J3089 Other allergic rhinitis: Secondary | ICD-10-CM | POA: Diagnosis not present

## 2015-03-22 DIAGNOSIS — J3081 Allergic rhinitis due to animal (cat) (dog) hair and dander: Secondary | ICD-10-CM | POA: Diagnosis not present

## 2015-03-22 DIAGNOSIS — J301 Allergic rhinitis due to pollen: Secondary | ICD-10-CM | POA: Diagnosis not present

## 2015-03-23 DIAGNOSIS — J301 Allergic rhinitis due to pollen: Secondary | ICD-10-CM | POA: Diagnosis not present

## 2015-03-23 DIAGNOSIS — J3089 Other allergic rhinitis: Secondary | ICD-10-CM | POA: Diagnosis not present

## 2015-03-23 DIAGNOSIS — J3081 Allergic rhinitis due to animal (cat) (dog) hair and dander: Secondary | ICD-10-CM | POA: Diagnosis not present

## 2015-03-29 DIAGNOSIS — J3089 Other allergic rhinitis: Secondary | ICD-10-CM | POA: Diagnosis not present

## 2015-03-29 DIAGNOSIS — J301 Allergic rhinitis due to pollen: Secondary | ICD-10-CM | POA: Diagnosis not present

## 2015-03-29 DIAGNOSIS — J3081 Allergic rhinitis due to animal (cat) (dog) hair and dander: Secondary | ICD-10-CM | POA: Diagnosis not present

## 2015-04-05 DIAGNOSIS — J3089 Other allergic rhinitis: Secondary | ICD-10-CM | POA: Diagnosis not present

## 2015-04-05 DIAGNOSIS — J3081 Allergic rhinitis due to animal (cat) (dog) hair and dander: Secondary | ICD-10-CM | POA: Diagnosis not present

## 2015-04-05 DIAGNOSIS — J301 Allergic rhinitis due to pollen: Secondary | ICD-10-CM | POA: Diagnosis not present

## 2015-04-12 DIAGNOSIS — J3089 Other allergic rhinitis: Secondary | ICD-10-CM | POA: Diagnosis not present

## 2015-04-12 DIAGNOSIS — J301 Allergic rhinitis due to pollen: Secondary | ICD-10-CM | POA: Diagnosis not present

## 2015-04-24 DIAGNOSIS — J3089 Other allergic rhinitis: Secondary | ICD-10-CM | POA: Diagnosis not present

## 2015-04-24 DIAGNOSIS — J301 Allergic rhinitis due to pollen: Secondary | ICD-10-CM | POA: Diagnosis not present

## 2015-04-24 DIAGNOSIS — J3081 Allergic rhinitis due to animal (cat) (dog) hair and dander: Secondary | ICD-10-CM | POA: Diagnosis not present

## 2015-04-27 ENCOUNTER — Encounter: Payer: Self-pay | Admitting: Family Medicine

## 2015-04-30 ENCOUNTER — Other Ambulatory Visit: Payer: Self-pay

## 2015-04-30 MED ORDER — AMLODIPINE BESYLATE 5 MG PO TABS: 5.0000 mg | ORAL_TABLET | Freq: Every day | ORAL | Status: DC

## 2015-05-02 DIAGNOSIS — J3089 Other allergic rhinitis: Secondary | ICD-10-CM | POA: Diagnosis not present

## 2015-05-02 DIAGNOSIS — J301 Allergic rhinitis due to pollen: Secondary | ICD-10-CM | POA: Diagnosis not present

## 2015-05-02 DIAGNOSIS — J3081 Allergic rhinitis due to animal (cat) (dog) hair and dander: Secondary | ICD-10-CM | POA: Diagnosis not present

## 2015-05-10 DIAGNOSIS — J019 Acute sinusitis, unspecified: Secondary | ICD-10-CM | POA: Diagnosis not present

## 2015-05-10 DIAGNOSIS — K210 Gastro-esophageal reflux disease with esophagitis: Secondary | ICD-10-CM | POA: Diagnosis not present

## 2015-05-10 DIAGNOSIS — J3089 Other allergic rhinitis: Secondary | ICD-10-CM | POA: Diagnosis not present

## 2015-05-10 DIAGNOSIS — J301 Allergic rhinitis due to pollen: Secondary | ICD-10-CM | POA: Diagnosis not present

## 2015-05-10 DIAGNOSIS — J3081 Allergic rhinitis due to animal (cat) (dog) hair and dander: Secondary | ICD-10-CM | POA: Diagnosis not present

## 2015-05-17 ENCOUNTER — Other Ambulatory Visit: Payer: Self-pay | Admitting: Family Medicine

## 2015-05-17 DIAGNOSIS — J301 Allergic rhinitis due to pollen: Secondary | ICD-10-CM | POA: Diagnosis not present

## 2015-05-17 DIAGNOSIS — J3089 Other allergic rhinitis: Secondary | ICD-10-CM | POA: Diagnosis not present

## 2015-05-17 DIAGNOSIS — J3081 Allergic rhinitis due to animal (cat) (dog) hair and dander: Secondary | ICD-10-CM | POA: Diagnosis not present

## 2015-05-23 DIAGNOSIS — J3089 Other allergic rhinitis: Secondary | ICD-10-CM | POA: Diagnosis not present

## 2015-05-23 DIAGNOSIS — J301 Allergic rhinitis due to pollen: Secondary | ICD-10-CM | POA: Diagnosis not present

## 2015-05-30 ENCOUNTER — Other Ambulatory Visit: Payer: Self-pay | Admitting: Family Medicine

## 2015-05-30 DIAGNOSIS — J3081 Allergic rhinitis due to animal (cat) (dog) hair and dander: Secondary | ICD-10-CM | POA: Diagnosis not present

## 2015-05-30 DIAGNOSIS — J3089 Other allergic rhinitis: Secondary | ICD-10-CM | POA: Diagnosis not present

## 2015-05-30 DIAGNOSIS — J301 Allergic rhinitis due to pollen: Secondary | ICD-10-CM | POA: Diagnosis not present

## 2015-06-07 DIAGNOSIS — J3081 Allergic rhinitis due to animal (cat) (dog) hair and dander: Secondary | ICD-10-CM | POA: Diagnosis not present

## 2015-06-07 DIAGNOSIS — J3089 Other allergic rhinitis: Secondary | ICD-10-CM | POA: Diagnosis not present

## 2015-06-07 DIAGNOSIS — J301 Allergic rhinitis due to pollen: Secondary | ICD-10-CM | POA: Diagnosis not present

## 2015-06-12 DIAGNOSIS — J3081 Allergic rhinitis due to animal (cat) (dog) hair and dander: Secondary | ICD-10-CM | POA: Diagnosis not present

## 2015-06-12 DIAGNOSIS — J301 Allergic rhinitis due to pollen: Secondary | ICD-10-CM | POA: Diagnosis not present

## 2015-06-12 DIAGNOSIS — J3089 Other allergic rhinitis: Secondary | ICD-10-CM | POA: Diagnosis not present

## 2015-06-19 DIAGNOSIS — J3089 Other allergic rhinitis: Secondary | ICD-10-CM | POA: Diagnosis not present

## 2015-06-19 DIAGNOSIS — J301 Allergic rhinitis due to pollen: Secondary | ICD-10-CM | POA: Diagnosis not present

## 2015-06-19 DIAGNOSIS — J3081 Allergic rhinitis due to animal (cat) (dog) hair and dander: Secondary | ICD-10-CM | POA: Diagnosis not present

## 2015-06-26 ENCOUNTER — Other Ambulatory Visit: Payer: Self-pay | Admitting: Emergency Medicine

## 2015-06-26 ENCOUNTER — Other Ambulatory Visit: Payer: Self-pay | Admitting: Family Medicine

## 2015-06-26 DIAGNOSIS — J3089 Other allergic rhinitis: Secondary | ICD-10-CM | POA: Diagnosis not present

## 2015-06-26 DIAGNOSIS — H40023 Open angle with borderline findings, high risk, bilateral: Secondary | ICD-10-CM | POA: Diagnosis not present

## 2015-06-26 DIAGNOSIS — J301 Allergic rhinitis due to pollen: Secondary | ICD-10-CM | POA: Diagnosis not present

## 2015-06-27 ENCOUNTER — Other Ambulatory Visit: Payer: Self-pay | Admitting: Family Medicine

## 2015-06-27 DIAGNOSIS — Z853 Personal history of malignant neoplasm of breast: Secondary | ICD-10-CM

## 2015-07-06 DIAGNOSIS — J3089 Other allergic rhinitis: Secondary | ICD-10-CM | POA: Diagnosis not present

## 2015-07-06 DIAGNOSIS — J301 Allergic rhinitis due to pollen: Secondary | ICD-10-CM | POA: Diagnosis not present

## 2015-07-06 DIAGNOSIS — J3081 Allergic rhinitis due to animal (cat) (dog) hair and dander: Secondary | ICD-10-CM | POA: Diagnosis not present

## 2015-07-12 ENCOUNTER — Other Ambulatory Visit: Payer: Self-pay | Admitting: Family Medicine

## 2015-07-13 DIAGNOSIS — J3081 Allergic rhinitis due to animal (cat) (dog) hair and dander: Secondary | ICD-10-CM | POA: Diagnosis not present

## 2015-07-13 DIAGNOSIS — J3089 Other allergic rhinitis: Secondary | ICD-10-CM | POA: Diagnosis not present

## 2015-07-13 DIAGNOSIS — J301 Allergic rhinitis due to pollen: Secondary | ICD-10-CM | POA: Diagnosis not present

## 2015-07-24 DIAGNOSIS — J301 Allergic rhinitis due to pollen: Secondary | ICD-10-CM | POA: Diagnosis not present

## 2015-07-24 DIAGNOSIS — J3089 Other allergic rhinitis: Secondary | ICD-10-CM | POA: Diagnosis not present

## 2015-07-24 DIAGNOSIS — J3081 Allergic rhinitis due to animal (cat) (dog) hair and dander: Secondary | ICD-10-CM | POA: Diagnosis not present

## 2015-08-01 ENCOUNTER — Other Ambulatory Visit: Payer: Self-pay

## 2015-08-01 ENCOUNTER — Other Ambulatory Visit: Payer: Self-pay | Admitting: Family Medicine

## 2015-08-01 DIAGNOSIS — J301 Allergic rhinitis due to pollen: Secondary | ICD-10-CM | POA: Diagnosis not present

## 2015-08-01 DIAGNOSIS — Z853 Personal history of malignant neoplasm of breast: Secondary | ICD-10-CM

## 2015-08-01 DIAGNOSIS — J3081 Allergic rhinitis due to animal (cat) (dog) hair and dander: Secondary | ICD-10-CM | POA: Diagnosis not present

## 2015-08-01 DIAGNOSIS — J3089 Other allergic rhinitis: Secondary | ICD-10-CM | POA: Diagnosis not present

## 2015-08-01 NOTE — Telephone Encounter (Signed)
Last visit 09/29/14 Last refill 03/01/15 #60 0 refill

## 2015-08-01 NOTE — Telephone Encounter (Signed)
Refill once okay

## 2015-08-03 ENCOUNTER — Ambulatory Visit
Admission: RE | Admit: 2015-08-03 | Discharge: 2015-08-03 | Disposition: A | Discharge status: Home / Self Care | Payer: Medicare Other | Source: Ambulatory Visit | Attending: Family Medicine | Admitting: Family Medicine

## 2015-08-03 DIAGNOSIS — R928 Other abnormal and inconclusive findings on diagnostic imaging of breast: Secondary | ICD-10-CM | POA: Diagnosis not present

## 2015-08-03 DIAGNOSIS — Z853 Personal history of malignant neoplasm of breast: Secondary | ICD-10-CM

## 2015-08-07 DIAGNOSIS — J3089 Other allergic rhinitis: Secondary | ICD-10-CM | POA: Diagnosis not present

## 2015-08-07 DIAGNOSIS — J3081 Allergic rhinitis due to animal (cat) (dog) hair and dander: Secondary | ICD-10-CM | POA: Diagnosis not present

## 2015-08-07 DIAGNOSIS — J301 Allergic rhinitis due to pollen: Secondary | ICD-10-CM | POA: Diagnosis not present

## 2015-08-15 DIAGNOSIS — J3089 Other allergic rhinitis: Secondary | ICD-10-CM | POA: Diagnosis not present

## 2015-08-15 DIAGNOSIS — J301 Allergic rhinitis due to pollen: Secondary | ICD-10-CM | POA: Diagnosis not present

## 2015-08-15 DIAGNOSIS — J3081 Allergic rhinitis due to animal (cat) (dog) hair and dander: Secondary | ICD-10-CM | POA: Diagnosis not present

## 2015-08-18 ENCOUNTER — Emergency Department (HOSPITAL_COMMUNITY): Payer: Medicare Other

## 2015-08-18 ENCOUNTER — Encounter (HOSPITAL_COMMUNITY): Payer: Self-pay | Admitting: Emergency Medicine

## 2015-08-18 ENCOUNTER — Emergency Department (HOSPITAL_COMMUNITY)
Admission: EM | Admit: 2015-08-18 | Discharge: 2015-08-18 | Disposition: A | Discharge status: Home / Self Care | Payer: Medicare Other | Attending: Emergency Medicine | Admitting: Emergency Medicine

## 2015-08-18 DIAGNOSIS — T84021A Dislocation of internal left hip prosthesis, initial encounter: Secondary | ICD-10-CM | POA: Diagnosis not present

## 2015-08-18 DIAGNOSIS — Y9289 Other specified places as the place of occurrence of the external cause: Secondary | ICD-10-CM | POA: Insufficient documentation

## 2015-08-18 DIAGNOSIS — X58XXXA Exposure to other specified factors, initial encounter: Secondary | ICD-10-CM | POA: Insufficient documentation

## 2015-08-18 DIAGNOSIS — I10 Essential (primary) hypertension: Secondary | ICD-10-CM | POA: Insufficient documentation

## 2015-08-18 DIAGNOSIS — S73005A Unspecified dislocation of left hip, initial encounter: Secondary | ICD-10-CM | POA: Diagnosis not present

## 2015-08-18 DIAGNOSIS — Z96649 Presence of unspecified artificial hip joint: Secondary | ICD-10-CM

## 2015-08-18 DIAGNOSIS — Z79899 Other long term (current) drug therapy: Secondary | ICD-10-CM | POA: Diagnosis not present

## 2015-08-18 DIAGNOSIS — E039 Hypothyroidism, unspecified: Secondary | ICD-10-CM | POA: Insufficient documentation

## 2015-08-18 DIAGNOSIS — M25559 Pain in unspecified hip: Secondary | ICD-10-CM

## 2015-08-18 DIAGNOSIS — S79912A Unspecified injury of left hip, initial encounter: Secondary | ICD-10-CM | POA: Diagnosis present

## 2015-08-18 DIAGNOSIS — T84029A Dislocation of unspecified internal joint prosthesis, initial encounter: Secondary | ICD-10-CM

## 2015-08-18 DIAGNOSIS — Y9389 Activity, other specified: Secondary | ICD-10-CM | POA: Diagnosis not present

## 2015-08-18 DIAGNOSIS — Y998 Other external cause status: Secondary | ICD-10-CM | POA: Diagnosis not present

## 2015-08-18 DIAGNOSIS — Y831 Surgical operation with implant of artificial internal device as the cause of abnormal reaction of the patient, or of later complication, without mention of misadventure at the time of the procedure: Secondary | ICD-10-CM | POA: Diagnosis not present

## 2015-08-18 DIAGNOSIS — M1990 Unspecified osteoarthritis, unspecified site: Secondary | ICD-10-CM | POA: Insufficient documentation

## 2015-08-18 DIAGNOSIS — Z96642 Presence of left artificial hip joint: Secondary | ICD-10-CM | POA: Diagnosis not present

## 2015-08-18 DIAGNOSIS — Z7982 Long term (current) use of aspirin: Secondary | ICD-10-CM | POA: Diagnosis not present

## 2015-08-18 DIAGNOSIS — Z471 Aftercare following joint replacement surgery: Secondary | ICD-10-CM | POA: Diagnosis not present

## 2015-08-18 DIAGNOSIS — M25552 Pain in left hip: Secondary | ICD-10-CM | POA: Diagnosis not present

## 2015-08-18 MED ORDER — HYDROMORPHONE HCL 1 MG/ML IJ SOLN
1.0000 mg | Freq: Once | INTRAMUSCULAR | Status: AC
  Administered 2015-08-18: 1 mg via INTRAVENOUS
  Filled 2015-08-18: qty 1

## 2015-08-18 MED ORDER — HYDROCODONE-ACETAMINOPHEN 5-325 MG PO TABS: 1.0000 | ORAL_TABLET | ORAL | Status: DC | PRN

## 2015-08-18 MED ORDER — PROPOFOL 10 MG/ML IV BOLUS
INTRAVENOUS | Status: AC | PRN
  Administered 2015-08-18: 80 mg via INTRAVENOUS
  Administered 2015-08-18: 40 mg via INTRAVENOUS

## 2015-08-18 MED ORDER — PROPOFOL 10 MG/ML IV BOLUS
INTRAVENOUS | Status: AC
  Filled 2015-08-18: qty 1

## 2015-08-18 MED ORDER — PROPOFOL 10 MG/ML IV BOLUS
0.5000 mg/kg | Freq: Once | INTRAVENOUS | Status: AC
  Administered 2015-08-18: 37.4 mg via INTRAVENOUS
  Filled 2015-08-18: qty 1

## 2015-08-18 MED ORDER — ONDANSETRON HCL 4 MG/2ML IJ SOLN
4.0000 mg | Freq: Once | INTRAMUSCULAR | Status: AC
  Administered 2015-08-18: 4 mg via INTRAVENOUS
  Filled 2015-08-18: qty 2

## 2015-08-18 MED ORDER — HYDROMORPHONE HCL 1 MG/ML IJ SOLN
0.5000 mg | Freq: Once | INTRAMUSCULAR | Status: AC
  Administered 2015-08-18: 0.5 mg via INTRAVENOUS
  Filled 2015-08-18: qty 1

## 2015-08-18 NOTE — ED Notes (Signed)
Pt was sitting on the front porch watching her grandchildren play. States she turned the wrong way, her and her son heard her hip 'pop', and she was then in an immense amount of pain. Only able to lay in the fetal position, left leg appears visibly shorter than right. Hx of left hip replacement.

## 2015-08-18 NOTE — Discharge Instructions (Signed)
Closed Reduction for Artificial Hip Dislocation °The dislocation of an artificial hip is when the ball of the hip has slipped out of its socket. Many of these dislocations can be treated by simply putting the ball back in the socket (closed reduction). This can often be done with a set of mechanical parts that move bones (traction) and moving the leg around (manipulation). When the artificial hip is put back into place with manipulation, a brace or cast may be needed. Your caregiver will decide if a brace or cast is necessary. Your caregiver will decide the amount of time required for this. A brace or cast will help you heal completely so the hip will be less likely to dislocate again. °When the hip cannot be relocated with manipulation, surgery may be needed.  °LET YOUR CAREGIVER KNOW ABOUT: °· Allergies to food or medicine. °· Medicines taken, including vitamins, herbs, eyedrops, over-the-counter medicines, and creams. °· Use of steroids (by mouth or creams). °· Previous problems with anesthetics or numbing medicines. °· History of bleeding problems or blood clots. °· Previous surgery. °· Other health problems, including diabetes and kidney problems. °· Possibility of pregnancy, if this applies. °BEFORE THE PROCEDURE °Before your hip is put back in place, an intravenous (IV) access may be started. You may be given an anesthetic in your back to make you numb from the waist down (spinal anesthetic).  °AFTER THE PROCEDURE °You will be taken to the recovery area. A nurse will monitor your progress. You will be returned to your room when you are awake and without problems. You will receive physical therapy until you are moving around by yourself and your caregiver feels it is safe for you to be released. °MAKE SURE YOU:  °· Understand these instructions. °· Will watch your condition. °· Will get help right away if you are not doing well or get worse. °Document Released: 08/05/2001 Document Revised: 02/02/2012 Document  Reviewed: 11/13/2008 °[employer]® Patient Information ©2015 [employer]. This information is not intended to replace advice given to you by your health care provider. Make sure you discuss any questions you have with your health care provider. ° °

## 2015-08-18 NOTE — ED Notes (Signed)
@   XRAY

## 2015-08-18 NOTE — ED Notes (Signed)
Bed: NO03 Expected date:  Expected time:  Means of arrival:  Comments: Hip dislocation

## 2015-08-18 NOTE — ED Notes (Signed)
XRAY performed

## 2015-08-18 NOTE — ED Notes (Addendum)
Pt alert, oriented, and wheeled to vehicle by NT [place]. Pt was advised to follow up with Orthopedics and take medications as prescribed.

## 2015-08-18 NOTE — Sedation Documentation (Signed)
MD attempted proper placement, XRAY at bedside, pt responding to verbal stimuli

## 2015-08-18 NOTE — ED Provider Notes (Signed)
CSN: [id number]     Arrival date & time 08/18/15  1727 History   First MD Initiated Contact with Patient 08/18/15 1734     Chief Complaint  Patient presents with  . Hip Pain     (Consider location/radiation/quality/duration/timing/severity/associated sxs/prior Treatment) Patient is a 64 y.o. female presenting with hip pain. The history is provided by the patient.  Hip Pain This is a new problem. Pertinent negatives include no chest pain, no abdominal pain, no headaches and no shortness of breath.   patient was sitting on the porch and twisted and felt a severe pain in her left hip. She has difficulty moving her left hip. She has had previous hip replacement on that side by Dr. Wynelle Link around 7 years ago. No other injury. Severe pain. Some nausea to the pain. Last ate some bagel this morning.  Past Medical History  Diagnosis Date  . HYPOTHYROIDISM 04/16/2009  . HYPERTENSION 04/16/2009  . SENILE LENTIGO 06/10/2010  . OSTEOARTHRITIS 04/16/2009  . COUGH, CHRONIC 05/10/2010   Past Surgical History  Procedure Laterality Date  . Cholecystectomy    . Breast surgery  2009    X 2, cancer stage 0, lumpectomy  . Joint replacement  2008    hip  . Hernia repair  2009  . Cesarean section      1979, 1982   Family History  Problem Relation Age of Onset  . Arthritis Other   . Hypertension Other   . Cancer Other     2 aunts  . Hyperlipidemia Father    Social History  Substance Use Topics  . Smoking status: Never Smoker   . Smokeless tobacco: None  . Alcohol Use: None   OB History    No data available     Review of Systems  Constitutional: Negative for activity change and appetite change.  Eyes: Negative for pain.  Respiratory: Negative for chest tightness and shortness of breath.   Cardiovascular: Negative for chest pain.  Gastrointestinal: Negative for abdominal pain and diarrhea.  Genitourinary: Negative for flank pain.  Musculoskeletal: Negative for back pain, neck pain and neck  stiffness.       Left hip pain  Skin: Negative for rash.  Neurological: Negative for weakness, numbness and headaches.  Psychiatric/Behavioral: Negative for behavioral problems.      Allergies  Codeine sulfate; Erythromycin base; and Esomeprazole magnesium  Home Medications   Prior to Admission medications   Medication Sig Start Date End Date Taking? Authorizing Provider  ALPRAZolam (XANAX) 0.25 MG tablet TAKE 2 TABLETS AT BEDTIME AS NEEDED FOR SLEEP/ANXIETY Patient taking differently: take 1/2 a tablet at bedtime as needed for sleep 08/03/15  Yes [name], MD  amLODipine (NORVASC) 5 MG tablet TAKE 1 TABLET (5 MG TOTAL) BY MOUTH DAILY. 06/26/15  Yes [name], MD  aspirin 81 MG tablet Take 81 mg by mouth daily.     Yes Historical Provider, MD  Azelastine HCl 0.15 % SOLN Place 1 spray into both nostrils daily as needed. 05/29/15  Yes Historical Provider, MD  Cholecalciferol (VITAMIN D PO) Take by mouth.   Yes Historical Provider, MD  citalopram (CELEXA) 20 MG tablet TAKE 1 TABLET BY MOUTH EVERY DAY Patient taking differently: take 1/2 a tablet daily 01/22/15  Yes [name], MD  EPIPEN 2-PAK 0.3 MG/0.3ML SOAJ injection  03/08/14  Yes Historical Provider, MD  levothyroxine (SYNTHROID, LEVOTHROID) 50 MCG tablet TAKE 1 TABLET EVERY DAY 02/01/13  Yes [name], MD  meloxicam (  MOBIC) 15 MG tablet TAKE 1 TABLET EVERY DAY 05/17/15  Yes [name], MD  pantoprazole (PROTONIX) 40 MG tablet TAKE 1 TABLET BY MOUTH TWICE DAILY Patient taking differently: take 1 tablet once daily 06/26/15  Yes Collene Gobble, MD  Probiotic Product (PROBIOTIC DAILY PO) Take by mouth.   Yes Historical Provider, MD  HYDROcodone-acetaminophen (NORCO/VICODIN) 5-325 MG per tablet Take 1-2 tablets by mouth every 4 (four) hours as needed. 08/18/15   Davonna Belling, MD   BP 101/63 mmHg  Pulse 69  Temp(Src) 97.8 F (36.6 C) (Oral)  Resp 16  Ht 5\' 4"  (1.626 m)  Wt 165 lb (74.844 kg)  BMI  28.31 kg/m2  SpO2 92% Physical Exam  Constitutional: She appears well-developed.  HENT:  Head: Atraumatic.  Neck: Neck supple.  Cardiovascular: Normal rate.   Pulmonary/Chest: Effort normal.  Abdominal: Soft.  Musculoskeletal:  Deformity. Decreased range of motion to left hip. Neurovascular intact in foot.  Neurological: She is alert.  Skin: Skin is warm.    ED Course  ORTHOPEDIC INJURY TREATMENT Date/Time: 08/18/2015 6:45 PM Performed by: Davonna Belling Authorized by: Davonna Belling Consent: Verbal consent obtained. Written consent obtained. Risks and benefits: risks, benefits and alternatives were discussed Consent given by: patient Patient understanding: patient states understanding of the procedure being performed Required items: required blood products, implants, devices, and special equipment available Patient identity confirmed: verbally with patient and arm band Time out: Immediately prior to procedure a "time out" was called to verify the correct patient, procedure, equipment, support staff and site/side marked as required. Injury location: hip Location details: left hip Injury type: dislocation Dislocation type: posterior Spontaneous dislocation: yes Prosthesis: yes Pre-procedure neurovascular assessment: neurovascularly intact Pre-procedure distal perfusion: normal Pre-procedure range of motion: reduced Patient sedated: yes Sedation type: moderate (conscious) sedation Sedatives: propofol Analgesia: hydromorphone Sedation start date/time: 08/18/2015 6:45 PM Sedation end date/time: 08/18/2015 7:10 PM Vitals: Vital signs were monitored during sedation. Manipulation performed: yes Reduction method: traction and counter traction Reduction successful: yes (Took 2 attempts) X-ray confirmed reduction: yes Immobilization: brace Post-procedure neurovascular assessment: post-procedure neurovascularly intact Post-procedure distal perfusion: normal Post-procedure  neurological function: normal Post-procedure range of motion: improved Patient tolerance: Patient tolerated the procedure well with no immediate complications   (including critical care time) Labs Review Labs Reviewed - No data to display  Imaging Review Dg Pelvis Portable  08/18/2015   CLINICAL DATA:  Reduction of a left hip dislocation. Two attempts made.  EXAM: PORTABLE PELVIS 1-2 VIEWS  COMPARISON:  Earlier today.  FINDINGS: The first portable AP view of the left hip demonstrates persistent superior dislocation of the femoral head component of the left total hip prosthesis. The second image demonstrates a normal position of the femoral head in the acetabular cup in the frontal projection. No fracture seen.  IMPRESSION: Reduced left hip dislocation in the frontal projection, as described above. A cross-table lateral view is recommended for confirmation.   Electronically Signed   By: Claudie Revering M.D.   On: 08/18/2015 20:14   Dg Hip Unilat With Pelvis 1v Left  08/18/2015   CLINICAL DATA:  Status post reduction of a left hip dislocation.  EXAM: DG HIP (WITH OR WITHOUT PELVIS) 1V*L*  COMPARISON:  Earlier today.  FINDINGS: A single cross-table view of the left hip demonstrates anatomic position and alignment of the components of the left hip prosthesis without the previously seen superior dislocation. No fracture visualized on this view.  IMPRESSION: Anatomic position and alignment on a single  view. Orthogonal views are recommended.   Electronically Signed   By: Claudie Revering M.D.   On: 08/18/2015 21:23   Dg Hip Unilat With Pelvis 2-3 Views Left  08/18/2015   CLINICAL DATA:  64 year old female with pain in the left hip today after turning while seated in a chair.  EXAM: DG HIP (WITH OR WITHOUT PELVIS) 2-3V LEFT  COMPARISON:  09/27/2007.  FINDINGS: Three views of the bony pelvis and left hip demonstrate no acute displaced fracture of the bony pelvis or the left hip. Status post left hip arthroplasty.  The prosthetic femoral head is dislocated and lies lateral to the left acetabular cup.  IMPRESSION: 1. Status post left hip total arthroplasty with complete superior and lateral dislocation of the prosthetic femoral head relative to the prosthetic acetabular cup.   Electronically Signed   By: Vinnie Langton M.D.   On: 08/18/2015 18:30   I have personally reviewed and evaluated these images and lab results as part of my medical decision-making.   EKG Interpretation None      MDM   Final diagnoses:  Hip pain  Dislocation of hip prosthesis, initial encounter    Patient with dislocation of hip prostheses. Dislocation reduced under conscious sedation. Took 2 attempts the first one did not result in complete reduction. Felt better after reduction. X-ray shows reduction. Will follow-up with her surgeon.      Davonna Belling, MD 08/18/15 [phone]

## 2015-08-28 DIAGNOSIS — J301 Allergic rhinitis due to pollen: Secondary | ICD-10-CM | POA: Diagnosis not present

## 2015-08-28 DIAGNOSIS — J3089 Other allergic rhinitis: Secondary | ICD-10-CM | POA: Diagnosis not present

## 2015-08-28 DIAGNOSIS — J3081 Allergic rhinitis due to animal (cat) (dog) hair and dander: Secondary | ICD-10-CM | POA: Diagnosis not present

## 2015-08-30 DIAGNOSIS — S73005D Unspecified dislocation of left hip, subsequent encounter: Secondary | ICD-10-CM | POA: Diagnosis not present

## 2015-09-03 DIAGNOSIS — S73005D Unspecified dislocation of left hip, subsequent encounter: Secondary | ICD-10-CM | POA: Diagnosis not present

## 2015-09-05 DIAGNOSIS — J301 Allergic rhinitis due to pollen: Secondary | ICD-10-CM | POA: Diagnosis not present

## 2015-09-05 DIAGNOSIS — J3081 Allergic rhinitis due to animal (cat) (dog) hair and dander: Secondary | ICD-10-CM | POA: Diagnosis not present

## 2015-09-05 DIAGNOSIS — J3089 Other allergic rhinitis: Secondary | ICD-10-CM | POA: Diagnosis not present

## 2015-09-06 DIAGNOSIS — S73005D Unspecified dislocation of left hip, subsequent encounter: Secondary | ICD-10-CM | POA: Diagnosis not present

## 2015-09-11 DIAGNOSIS — S73005D Unspecified dislocation of left hip, subsequent encounter: Secondary | ICD-10-CM | POA: Diagnosis not present

## 2015-09-13 DIAGNOSIS — J301 Allergic rhinitis due to pollen: Secondary | ICD-10-CM | POA: Diagnosis not present

## 2015-09-13 DIAGNOSIS — J3089 Other allergic rhinitis: Secondary | ICD-10-CM | POA: Diagnosis not present

## 2015-09-13 DIAGNOSIS — K210 Gastro-esophageal reflux disease with esophagitis: Secondary | ICD-10-CM | POA: Diagnosis not present

## 2015-09-13 DIAGNOSIS — J3081 Allergic rhinitis due to animal (cat) (dog) hair and dander: Secondary | ICD-10-CM | POA: Diagnosis not present

## 2015-09-20 DIAGNOSIS — J069 Acute upper respiratory infection, unspecified: Secondary | ICD-10-CM | POA: Diagnosis not present

## 2015-09-20 DIAGNOSIS — J029 Acute pharyngitis, unspecified: Secondary | ICD-10-CM | POA: Diagnosis not present

## 2015-09-22 ENCOUNTER — Other Ambulatory Visit: Payer: Self-pay | Admitting: Family Medicine

## 2015-09-24 DIAGNOSIS — J301 Allergic rhinitis due to pollen: Secondary | ICD-10-CM | POA: Diagnosis not present

## 2015-09-24 DIAGNOSIS — J3089 Other allergic rhinitis: Secondary | ICD-10-CM | POA: Diagnosis not present

## 2015-09-24 DIAGNOSIS — J3081 Allergic rhinitis due to animal (cat) (dog) hair and dander: Secondary | ICD-10-CM | POA: Diagnosis not present

## 2015-10-01 DIAGNOSIS — S73005D Unspecified dislocation of left hip, subsequent encounter: Secondary | ICD-10-CM | POA: Diagnosis not present

## 2015-10-02 DIAGNOSIS — J3081 Allergic rhinitis due to animal (cat) (dog) hair and dander: Secondary | ICD-10-CM | POA: Diagnosis not present

## 2015-10-02 DIAGNOSIS — J3089 Other allergic rhinitis: Secondary | ICD-10-CM | POA: Diagnosis not present

## 2015-10-02 DIAGNOSIS — J301 Allergic rhinitis due to pollen: Secondary | ICD-10-CM | POA: Diagnosis not present

## 2015-10-03 ENCOUNTER — Other Ambulatory Visit: Payer: Self-pay | Admitting: Family Medicine

## 2015-10-03 ENCOUNTER — Ambulatory Visit (INDEPENDENT_AMBULATORY_CARE_PROVIDER_SITE_OTHER): Payer: Medicare Other

## 2015-10-03 DIAGNOSIS — Z23 Encounter for immunization: Secondary | ICD-10-CM

## 2015-10-03 DIAGNOSIS — S73005D Unspecified dislocation of left hip, subsequent encounter: Secondary | ICD-10-CM | POA: Diagnosis not present

## 2015-10-08 DIAGNOSIS — S73005D Unspecified dislocation of left hip, subsequent encounter: Secondary | ICD-10-CM | POA: Diagnosis not present

## 2015-10-09 DIAGNOSIS — J3089 Other allergic rhinitis: Secondary | ICD-10-CM | POA: Diagnosis not present

## 2015-10-09 DIAGNOSIS — J3081 Allergic rhinitis due to animal (cat) (dog) hair and dander: Secondary | ICD-10-CM | POA: Diagnosis not present

## 2015-10-09 DIAGNOSIS — J301 Allergic rhinitis due to pollen: Secondary | ICD-10-CM | POA: Diagnosis not present

## 2015-10-11 ENCOUNTER — Other Ambulatory Visit: Payer: Self-pay | Admitting: Emergency Medicine

## 2015-10-11 DIAGNOSIS — J3089 Other allergic rhinitis: Secondary | ICD-10-CM | POA: Diagnosis not present

## 2015-10-11 DIAGNOSIS — Z471 Aftercare following joint replacement surgery: Secondary | ICD-10-CM | POA: Diagnosis not present

## 2015-10-11 DIAGNOSIS — J3081 Allergic rhinitis due to animal (cat) (dog) hair and dander: Secondary | ICD-10-CM | POA: Diagnosis not present

## 2015-10-11 DIAGNOSIS — S73005D Unspecified dislocation of left hip, subsequent encounter: Secondary | ICD-10-CM | POA: Diagnosis not present

## 2015-10-11 DIAGNOSIS — J301 Allergic rhinitis due to pollen: Secondary | ICD-10-CM | POA: Diagnosis not present

## 2015-10-11 DIAGNOSIS — Z96642 Presence of left artificial hip joint: Secondary | ICD-10-CM | POA: Diagnosis not present

## 2015-10-15 ENCOUNTER — Other Ambulatory Visit: Payer: Self-pay | Admitting: Emergency Medicine

## 2015-10-15 ENCOUNTER — Encounter: Payer: Self-pay | Admitting: Family Medicine

## 2015-10-15 NOTE — Telephone Encounter (Signed)
  Patient sent request to wrong provider.  Should have gone to Dr. Elease Hashimoto.

## 2015-10-16 ENCOUNTER — Other Ambulatory Visit: Payer: Self-pay | Admitting: Family Medicine

## 2015-10-16 MED ORDER — PANTOPRAZOLE SODIUM 40 MG PO TBEC: 40.0000 mg | DELAYED_RELEASE_TABLET | Freq: Every day | ORAL | Status: DC

## 2015-10-17 DIAGNOSIS — J3081 Allergic rhinitis due to animal (cat) (dog) hair and dander: Secondary | ICD-10-CM | POA: Diagnosis not present

## 2015-10-17 DIAGNOSIS — J301 Allergic rhinitis due to pollen: Secondary | ICD-10-CM | POA: Diagnosis not present

## 2015-10-17 DIAGNOSIS — J3089 Other allergic rhinitis: Secondary | ICD-10-CM | POA: Diagnosis not present

## 2015-10-21 ENCOUNTER — Other Ambulatory Visit: Payer: Self-pay | Admitting: Family Medicine

## 2015-10-26 ENCOUNTER — Other Ambulatory Visit: Payer: Medicare Other

## 2015-10-30 ENCOUNTER — Other Ambulatory Visit (INDEPENDENT_AMBULATORY_CARE_PROVIDER_SITE_OTHER): Payer: Medicare Other

## 2015-10-30 DIAGNOSIS — I10 Essential (primary) hypertension: Secondary | ICD-10-CM | POA: Diagnosis not present

## 2015-10-30 DIAGNOSIS — E039 Hypothyroidism, unspecified: Secondary | ICD-10-CM | POA: Diagnosis not present

## 2015-10-30 DIAGNOSIS — J3089 Other allergic rhinitis: Secondary | ICD-10-CM | POA: Diagnosis not present

## 2015-10-30 DIAGNOSIS — Z0000 Encounter for general adult medical examination without abnormal findings: Secondary | ICD-10-CM | POA: Diagnosis not present

## 2015-10-30 DIAGNOSIS — J301 Allergic rhinitis due to pollen: Secondary | ICD-10-CM | POA: Diagnosis not present

## 2015-10-30 DIAGNOSIS — J3081 Allergic rhinitis due to animal (cat) (dog) hair and dander: Secondary | ICD-10-CM | POA: Diagnosis not present

## 2015-10-30 LAB — BASIC METABOLIC PANEL
BUN: 11 mg/dL (ref 6–23)
CHLORIDE: 102 meq/L (ref 96–112)
CO2: 31 meq/L (ref 19–32)
Calcium: 9.2 mg/dL (ref 8.4–10.5)
Creatinine, Ser: 0.74 mg/dL (ref 0.40–1.20)
GFR: 83.37 mL/min (ref >60.00)
GLUCOSE: 91 mg/dL (ref 70–99)
POTASSIUM: 3.4 meq/L — AB (ref 3.5–5.1)
Sodium: 142 mEq/L (ref 135–145)

## 2015-10-30 LAB — CBC WITH DIFFERENTIAL/PLATELET
BASOS PCT: 0.6 % (ref 0.0–3.0)
Basophils Absolute: 0 10*3/uL (ref 0.0–0.1)
EOS PCT: 6.8 % — AB (ref 0.0–5.0)
Eosinophils Absolute: 0.5 10*3/uL (ref 0.0–0.7)
HCT: 42 % (ref 36.0–46.0)
HEMOGLOBIN: 14 g/dL (ref 12.0–15.0)
Lymphocytes Relative: 24.6 % (ref 12.0–46.0)
Lymphs Abs: 1.7 10*3/uL (ref 0.7–4.0)
MCHC: 33.3 g/dL (ref 30.0–36.0)
MCV: 86.3 fl (ref 78.0–100.0)
MONO ABS: 0.6 10*3/uL (ref 0.1–1.0)
Monocytes Relative: 8.2 % (ref 3.0–12.0)
NEUTROS ABS: 4.1 10*3/uL (ref 1.4–7.7)
Neutrophils Relative %: 59.8 % (ref 43.0–77.0)
PLATELETS: 358 10*3/uL (ref 150.0–400.0)
RBC: 4.87 Mil/uL (ref 3.87–5.11)
RDW: 13.4 % (ref 11.5–15.5)
WBC: 6.8 10*3/uL (ref 4.0–10.5)

## 2015-10-30 LAB — LIPID PANEL
CHOLESTEROL: 196 mg/dL (ref 0–200)
HDL: 44.2 mg/dL (ref >39.00)
LDL CALC: 122 mg/dL — AB (ref 0–99)
NonHDL: 151.94
TRIGLYCERIDES: 148 mg/dL (ref 0.0–149.0)
Total CHOL/HDL Ratio: 4
VLDL: 29.6 mg/dL (ref 0.0–40.0)

## 2015-10-30 LAB — HEPATIC FUNCTION PANEL
ALT: 14 U/L (ref 0–35)
AST: 14 U/L (ref 0–37)
Albumin: 3.9 g/dL (ref 3.5–5.2)
Alkaline Phosphatase: 86 U/L (ref 39–117)
BILIRUBIN TOTAL: 0.9 mg/dL (ref 0.2–1.2)
Bilirubin, Direct: 0.1 mg/dL (ref 0.0–0.3)
TOTAL PROTEIN: 7 g/dL (ref 6.0–8.3)

## 2015-10-30 LAB — TSH: TSH: 2.36 u[IU]/mL (ref 0.35–4.50)

## 2015-11-06 ENCOUNTER — Ambulatory Visit (INDEPENDENT_AMBULATORY_CARE_PROVIDER_SITE_OTHER): Payer: Medicare Other | Admitting: Family Medicine

## 2015-11-06 VITALS — BP 140/86

## 2015-11-06 DIAGNOSIS — Z23 Encounter for immunization: Secondary | ICD-10-CM

## 2015-11-06 DIAGNOSIS — Z0001 Encounter for general adult medical examination with abnormal findings: Secondary | ICD-10-CM

## 2015-11-06 DIAGNOSIS — Z0000 Encounter for general adult medical examination without abnormal findings: Secondary | ICD-10-CM

## 2015-11-06 DIAGNOSIS — E785 Hyperlipidemia, unspecified: Secondary | ICD-10-CM

## 2015-11-06 DIAGNOSIS — R05 Cough: Secondary | ICD-10-CM

## 2015-11-06 DIAGNOSIS — Z780 Asymptomatic menopausal state: Secondary | ICD-10-CM

## 2015-11-06 MED ORDER — TRETINOIN 0.025 % EX CREA: TOPICAL_CREAM | Freq: Every day | CUTANEOUS | Status: DC

## 2015-11-06 NOTE — Progress Notes (Addendum)
Subjective:    Patient ID: Theresa Bradley, female    DOB: 1951/06/26, 64 y.o.   MRN: [id number]  HPI Patient here for complete physical. Chronic problems include history of osteoarthritis, hypertension, GERD, hypothyroidism She's had prior left total hip replacement and unfortunately several months ago had dislocation. Had no problems since then. She is able to ambulate without pain. Followed by orthopedics. Last Pap smear 2 years ago normal. Low risk. Had Prevnar last year. Needs Pneumovax. She takes vitamin D but not calcium consistently.  Long history of chronic cough. Evaluated by multiple specialists. GERD treated with Protonix. Occasional postnasal drip symptoms. Occasional GERD symptoms even with twice a day Protonix.  Past Medical History  Diagnosis Date  . HYPOTHYROIDISM 04/16/2009  . HYPERTENSION 04/16/2009  . SENILE LENTIGO 06/10/2010  . OSTEOARTHRITIS 04/16/2009  . COUGH, CHRONIC 05/10/2010   Past Surgical History  Procedure Laterality Date  . Cholecystectomy    . Breast surgery  2009    X 2, cancer stage 0, lumpectomy  . Joint replacement  2008    hip  . Hernia repair  2009  . Cesarean section      1979, 1982    reports that she has never smoked. She does not have any smokeless tobacco history on file. Her alcohol and drug histories are not on file. family history includes Arthritis in her other; Cancer in her other; Hyperlipidemia in her father; Hypertension in her other. Allergies  Allergen Reactions  . Codeine Sulfate Nausea And Vomiting  . Erythromycin Base Other (See Comments)    Stomach ache  . Esomeprazole Magnesium     REACTION: GI upset      Review of Systems  Constitutional: Negative for fever, activity change, appetite change, fatigue and unexpected weight change.  HENT: Negative for ear pain, hearing loss, sore throat and trouble swallowing.   Eyes: Negative for visual disturbance.  Respiratory: Positive for cough. Negative for shortness of  breath.   Cardiovascular: Negative for chest pain and palpitations.  Gastrointestinal: Negative for abdominal pain, diarrhea, constipation and blood in stool.  Genitourinary: Negative for dysuria and hematuria.  Musculoskeletal: Positive for arthralgias. Negative for myalgias and back pain.  Skin: Negative for rash.  Neurological: Negative for dizziness, syncope and headaches.  Hematological: Negative for adenopathy.  Psychiatric/Behavioral: Negative for confusion and dysphoric mood.       Objective:   Physical Exam  Constitutional: She is oriented to person, place, and time. She appears well-developed and well-nourished.  HENT:  Head: Normocephalic and atraumatic.  Eyes: EOM are normal. Pupils are equal, round, and reactive to light.  Neck: Normal range of motion. Neck supple. No thyromegaly present.  Cardiovascular: Normal rate, regular rhythm and normal heart sounds.   No murmur heard. Pulmonary/Chest: Breath sounds normal. No respiratory distress. She has no wheezes. She has no rales.  Abdominal: Soft. Bowel sounds are normal. She exhibits no distension and no mass. There is no tenderness. There is no rebound and no guarding.  Musculoskeletal: Normal range of motion. She exhibits no edema.  Lymphadenopathy:    She has no cervical adenopathy.  Neurological: She is alert and oriented to person, place, and time. She displays normal reflexes. No cranial nerve deficit.  Skin: No rash noted.  Psychiatric: She has a normal mood and affect. Her behavior is normal. Judgment and thought content normal.          Assessment & Plan:  Physical exam. Labs reviewed. Minimally low potassium 3.4. She is not taking  diuretics. Increase dietary intake. Mild hyperlipidemia. Suggested Mediterranean diet with handout given. Increase calcium to 1200 mg daily.  Pneumovax given.  Chronic cough. Extensive workup previously. No red flag symptoms. She's tried multiple things including tramadol without  improvement. Consider low-dose gabapentin  Pt also getting treatment for hirsutism of face and c/o this is painful.  Rx for EMLA topically one hour prior to procedure.  She has no contraindications.

## 2015-11-06 NOTE — Progress Notes (Signed)
Pre visit review using our clinic review tool, if applicable. No additional management support is needed unless otherwise documented below in the visit note. 

## 2015-11-06 NOTE — Patient Instructions (Signed)
Potassium Content of Foods Potassium is a mineral found in many foods and drinks. It helps keep fluids and minerals balanced in your body and affects how steadily your heart beats. Potassium also helps control your blood pressure and keep your muscles and nervous system healthy. Certain health conditions and medicines may change the balance of potassium in your body. When this happens, you can help balance your level of potassium through the foods that you do or do not eat. Your health care provider or dietitian may recommend an amount of potassium that you should have each day. The following lists of foods provide the amount of potassium (in parentheses) per serving in each item. HIGH IN POTASSIUM  The following foods and beverages have 200 mg or more of potassium per serving:  Apricots, 2 raw or 5 dry (200 mg).  Artichoke, 1 medium (345 mg).  Avocado, raw,  each (245 mg).  Banana, 1 medium (425 mg).  Beans, lima, or baked beans, canned,  cup (280 mg).  Beans, white, canned,  cup (595 mg).  Beef roast, 3 oz (320 mg).  Beef, ground, 3 oz (270 mg).  Beets, raw or cooked,  cup (260 mg).  Bran muffin, 2 oz (300 mg).  Broccoli,  cup (230 mg).  Brussels sprouts,  cup (250 mg).  Cantaloupe,  cup (215 mg).  Cereal, 100% bran,  cup (200-400 mg).  Cheeseburger, single, fast food, 1 each (225-400 mg).  Chicken, 3 oz (220 mg).  Clams, canned, 3 oz (535 mg).  Crab, 3 oz (225 mg).  Dates, 5 each (270 mg).  Dried beans and peas,  cup (300-475 mg).  Figs, dried, 2 each (260 mg).  Fish: halibut, tuna, cod, snapper, 3 oz (480 mg).  Fish: salmon, haddock, swordfish, perch, 3 oz (300 mg).  Fish, tuna, canned 3 oz (200 mg).  French fries, fast food, 3 oz (470 mg).  Granola with fruit and nuts,  cup (200 mg).  Grapefruit juice,  cup (200 mg).  Greens, beet,  cup (655 mg).  Honeydew melon,  cup (200 mg).  Kale, raw, 1 cup (300 mg).  Kiwi, 1 medium (240  mg).  Kohlrabi, rutabaga, parsnips,  cup (280 mg).  Lentils,  cup (365 mg).  Mango, 1 each (325 mg).  Milk, chocolate, 1 cup (420 mg).  Milk: nonfat, low-fat, whole, buttermilk, 1 cup (350-380 mg).  Molasses, 1 Tbsp (295 mg).  Mushrooms,  cup (280) mg.  Nectarine, 1 each (275 mg).  Nuts: almonds, peanuts, hazelnuts, [place], cashew, mixed, 1 oz (200 mg).  Nuts, pistachios, 1 oz (295 mg).  Orange, 1 each (240 mg).  Orange juice,  cup (235 mg).  Papaya, medium,  fruit (390 mg).  Peanut butter, chunky, 2 Tbsp (240 mg).  Peanut butter, smooth, 2 Tbsp (210 mg).  Pear, 1 medium (200 mg).  Pomegranate, 1 whole (400 mg).  Pomegranate juice,  cup (215 mg).  Pork, 3 oz (350 mg).  Potato chips, salted, 1 oz (465 mg).  Potato, baked with skin, 1 medium (925 mg).  Potatoes, boiled,  cup (255 mg).  Potatoes, mashed,  cup (330 mg).  Prune juice,  cup (370 mg).  Prunes, 5 each (305 mg).  Pudding, chocolate,  cup (230 mg).  Pumpkin, canned,  cup (250 mg).  Raisins, seedless,  cup (270 mg).  Seeds, sunflower or pumpkin, 1 oz (240 mg).  Soy milk, 1 cup (300 mg).  Spinach,  cup (420 mg).  Spinach, canned,  cup (370 mg).    Sweet potato, baked with skin, 1 medium (450 mg).  Swiss chard,  cup (480 mg).  Tomato or vegetable juice,  cup (275 mg).  Tomato sauce or puree,  cup (400-550 mg).  Tomato, raw, 1 medium (290 mg).  Tomatoes, canned,  cup (200-300 mg).  Turkey, 3 oz (250 mg).  Wheat germ, 1 oz (250 mg).  Winter squash,  cup (250 mg).  Yogurt, plain or fruited, 6 oz (260-435 mg).  Zucchini,  cup (220 mg). MODERATE IN POTASSIUM The following foods and beverages have 50-200 mg of potassium per serving:  Apple, 1 each (150 mg).  Apple juice,  cup (150 mg).  Applesauce,  cup (90 mg).  Apricot nectar,  cup (140 mg).  Asparagus, small spears,  cup or 6 spears (155 mg).  Bagel, cinnamon raisin, 1 each (130 mg).  Bagel,  egg or plain, 4 in., 1 each (70 mg).  Beans, green,  cup (90 mg).  Beans, yellow,  cup (190 mg).  Beer, regular, 12 oz (100 mg).  Beets, canned,  cup (125 mg).  Blackberries,  cup (115 mg).  Blueberries,  cup (60 mg).  Bread, whole wheat, 1 slice (70 mg).  Broccoli, raw,  cup (145 mg).  Cabbage,  cup (150 mg).  Carrots, cooked or raw,  cup (180 mg).  Cauliflower, raw,  cup (150 mg).  Celery, raw,  cup (155 mg).  Cereal, bran flakes, cup (120-150 mg).  Cheese, cottage,  cup (110 mg).  Cherries, 10 each (150 mg).  Chocolate, 1 oz bar (165 mg).  Coffee, brewed 6 oz (90 mg).  Corn,  cup or 1 ear (195 mg).  Cucumbers,  cup (80 mg).  Egg, large, 1 each (60 mg).  Eggplant,  cup (60 mg).  Endive, raw, cup (80 mg).  English muffin, 1 each (65 mg).  Fish, orange roughy, 3 oz (150 mg).  Frankfurter, beef or pork, 1 each (75 mg).  Fruit cocktail,  cup (115 mg).  Grape juice,  cup (170 mg).  Grapefruit,  fruit (175 mg).  Grapes,  cup (155 mg).  Greens: kale, turnip, collard,  cup (110-150 mg).  Ice cream or frozen yogurt, chocolate,  cup (175 mg).  Ice cream or frozen yogurt, vanilla,  cup (120-150 mg).  Lemons, limes, 1 each (80 mg).  Lettuce, all types, 1 cup (100 mg).  Mixed vegetables,  cup (150 mg).  Mushrooms, raw,  cup (110 mg).  Nuts: walnuts, pecans, or macadamia, 1 oz (125 mg).  Oatmeal,  cup (80 mg).  Okra,  cup (110 mg).  Onions, raw,  cup (120 mg).  Peach, 1 each (185 mg).  Peaches, canned,  cup (120 mg).  Pears, canned,  cup (120 mg).  Peas, green, frozen,  cup (90 mg).  Peppers, green,  cup (130 mg).  Peppers, red,  cup (160 mg).  Pineapple juice,  cup (165 mg).  Pineapple, fresh or canned,  cup (100 mg).  Plums, 1 each (105 mg).  Pudding, vanilla,  cup (150 mg).  Raspberries,  cup (90 mg).  Rhubarb,  cup (115 mg).  Rice, wild,  cup (80 mg).  Shrimp, 3 oz (155  mg).  Spinach, raw, 1 cup (170 mg).  Strawberries,  cup (125 mg).  Summer squash  cup (175-200 mg).  Swiss chard, raw, 1 cup (135 mg).  Tangerines, 1 each (140 mg).  Tea, brewed, 6 oz (65 mg).  Turnips,  cup (140 mg).  Watermelon,  cup (85 mg).  Wine, red, table,   5 oz (180 mg).  Wine, white, table, 5 oz (100 mg). LOW IN POTASSIUM The following foods and beverages have less than 50 mg of potassium per serving.  Bread, white, 1 slice (30 mg).  Carbonated beverages, 12 oz (less than 5 mg).  Cheese, 1 oz (20-30 mg).  Cranberries,  cup (45 mg).  Cranberry juice cocktail,  cup (20 mg).  Fats and oils, 1 Tbsp (less than 5 mg).  Hummus, 1 Tbsp (32 mg).  Nectar: papaya, mango, or pear,  cup (35 mg).  Rice, white or brown,  cup (50 mg).  Spaghetti or macaroni,  cup cooked (30 mg).  Tortilla, flour or corn, 1 each (50 mg).  Waffle, 4 in., 1 each (50 mg).  Water chestnuts,  cup (40 mg).   This information is not intended to replace advice given to you by your health care provider. Make sure you discuss any questions you have with your health care provider.   Document Released: 06/24/2005 Document Revised: 11/15/2013 Document Reviewed: 10/07/2013 Elsevier Interactive Patient Education [employer].    Why follow it? Research shows. . Those who follow the Mediterranean diet have a reduced risk of heart disease  . The diet is associated with a reduced incidence of Parkinson's and Alzheimer's diseases . People following the diet may have longer life expectancies and lower rates of chronic diseases  . The Dietary Guidelines for Americans recommends the Mediterranean diet as an eating plan to promote health and prevent disease  What Is the Mediterranean Diet?  . Healthy eating plan based on typical foods and recipes of Mediterranean-style cooking . The diet is primarily a plant based diet; these foods should make up a majority of meals   Starches - Plant  based foods should make up a majority of meals - They are an important sources of vitamins, minerals, energy, antioxidants, and fiber - Choose whole grains, foods high in fiber and minimally processed items  - Typical grain sources include wheat, oats, barley, corn, brown rice, bulgar, farro, millet, polenta, couscous  - Various types of beans include chickpeas, lentils, fava beans, black beans, white beans   Fruits  Veggies - Large quantities of antioxidant rich fruits & veggies; 6 or more servings  - Vegetables can be eaten raw or lightly drizzled with oil and cooked  - Vegetables common to the traditional Mediterranean Diet include: artichokes, arugula, beets, broccoli, brussel sprouts, cabbage, carrots, celery, collard greens, cucumbers, eggplant, kale, leeks, lemons, lettuce, mushrooms, okra, onions, peas, peppers, potatoes, pumpkin, radishes, rutabaga, shallots, spinach, sweet potatoes, turnips, zucchini - Fruits common to the Mediterranean Diet include: apples, apricots, avocados, cherries, clementines, dates, figs, grapefruits, grapes, melons, nectarines, oranges, peaches, pears, pomegranates, strawberries, tangerines  Fats - Replace butter and margarine with healthy oils, such as olive oil, canola oil, and tahini  - Limit nuts to no more than a handful a day  - Nuts include walnuts, almonds, pecans, pistachios, pine nuts  - Limit or avoid candied, honey roasted or heavily salted nuts - Olives are central to the [employer] - can be eaten whole or used in a variety of dishes   Meats Protein - Limiting red meat: no more than a few times a month - When eating red meat: choose lean cuts and keep the portion to the size of deck of cards - Eggs: approx. 0 to 4 times a week  - Fish and lean poultry: at least 2 a week  - Healthy protein sources include, chicken, [place],  lean beef, lamb - Increase intake of seafood such as tuna, salmon, trout, mackerel, shrimp, scallops - Avoid or limit  high fat processed meats such as sausage and bacon  Dairy - Include moderate amounts of low fat dairy products  - Focus on healthy dairy such as fat free yogurt, skim milk, low or reduced fat cheese - Limit dairy products higher in fat such as whole or 2% milk, cheese, ice cream  Alcohol - Moderate amounts of red wine is ok  - No more than 5 oz daily for women (all ages) and men older than age 34  - No more than 10 oz of wine daily for men younger than 28  Other - Limit sweets and other desserts  - Use herbs and spices instead of salt to flavor foods  - Herbs and spices common to the traditional Mediterranean Diet include: basil, bay leaves, chives, cloves, cumin, fennel, garlic, lavender, marjoram, mint, oregano, parsley, pepper, rosemary, sage, savory, sumac, tarragon, thyme   It's not just a diet, it's a lifestyle:  . The Mediterranean diet includes lifestyle factors typical of those in the region  . Foods, drinks and meals are best eaten with others and savored . Daily physical activity is important for overall good health . This could be strenuous exercise like running and aerobics . This could also be more leisurely activities such as walking, housework, yard-work, or taking the stairs . Moderation is the key; a balanced and healthy diet accommodates most foods and drinks . Consider portion sizes and frequency of consumption of certain foods   Meal Ideas & Options:  . Breakfast:  o Whole wheat toast or whole wheat English muffins with peanut butter & hard boiled egg o Steel cut oats topped with apples & cinnamon and skim milk  o Fresh fruit: banana, strawberries, melon, berries, peaches  o Smoothies: strawberries, bananas, greek yogurt, peanut butter o Low fat greek yogurt with blueberries and granola  o Egg white omelet with spinach and mushrooms o Breakfast couscous: whole wheat couscous, apricots, skim milk, cranberries  . Sandwiches:  o Hummus and grilled vegetables (peppers,  zucchini, squash) on whole wheat bread   o Grilled chicken on whole wheat pita with lettuce, tomatoes, cucumbers or tzatziki  o Tuna salad on whole wheat bread: tuna salad made with greek yogurt, olives, red peppers, capers, green onions o Garlic rosemary lamb pita: lamb sauted with garlic, rosemary, salt & pepper; add lettuce, cucumber, greek yogurt to pita - flavor with lemon juice and black pepper  . Seafood:  o Mediterranean grilled salmon, seasoned with garlic, basil, parsley, lemon juice and black pepper o Shrimp, lemon, and spinach whole-grain pasta salad made with low fat greek yogurt  o Seared scallops with lemon orzo  o Seared tuna steaks seasoned salt, pepper, coriander topped with tomato mixture of olives, tomatoes, olive oil, minced garlic, parsley, green onions and cappers  . Meats:  o Herbed greek chicken salad with kalamata olives, cucumber, feta  o Red bell peppers stuffed with spinach, bulgur, lean ground beef (or lentils) & topped with feta   o Kebabs: skewers of chicken, tomatoes, onions, zucchini, squash  o [place] burgers: made with red onions, mint, dill, lemon juice, feta cheese topped with roasted red peppers . Vegetarian o Cucumber salad: cucumbers, artichoke hearts, celery, red onion, feta cheese, tossed in olive oil & lemon juice  o Hummus and whole grain pita points with a greek salad (lettuce, tomato, feta, olives, cucumbers, red onion) o  Lentil soup with celery, carrots made with vegetable broth, garlic, salt and pepper  o Tabouli salad: parsley, bulgur, mint, scallions, cucumbers, tomato, radishes, lemon juice, olive oil, salt and pepper.  Calcium 1,200 mg daily

## 2015-11-07 MED ORDER — GABAPENTIN 300 MG PO CAPS: ORAL_CAPSULE | ORAL | Status: DC

## 2015-11-07 MED ORDER — LIDOCAINE-PRILOCAINE 2.5-2.5 % EX CREA: 1.0000 "application " | TOPICAL_CREAM | CUTANEOUS | Status: DC | PRN

## 2015-11-07 NOTE — Addendum Note (Signed)
Addended by: [name] on: 11/07/2015 08:31 AM   Modules accepted: Orders

## 2015-11-08 DIAGNOSIS — J3089 Other allergic rhinitis: Secondary | ICD-10-CM | POA: Diagnosis not present

## 2015-11-08 DIAGNOSIS — J3081 Allergic rhinitis due to animal (cat) (dog) hair and dander: Secondary | ICD-10-CM | POA: Diagnosis not present

## 2015-11-08 DIAGNOSIS — J301 Allergic rhinitis due to pollen: Secondary | ICD-10-CM | POA: Diagnosis not present

## 2015-11-10 ENCOUNTER — Other Ambulatory Visit: Payer: Self-pay | Admitting: Family Medicine

## 2015-11-10 ENCOUNTER — Encounter: Payer: Self-pay | Admitting: Family Medicine

## 2015-11-12 NOTE — Telephone Encounter (Signed)
Please review. Would it be best for her to follow up with previous GYN?

## 2015-11-13 ENCOUNTER — Other Ambulatory Visit: Payer: Self-pay | Admitting: Family Medicine

## 2015-11-21 DIAGNOSIS — J3081 Allergic rhinitis due to animal (cat) (dog) hair and dander: Secondary | ICD-10-CM | POA: Diagnosis not present

## 2015-11-21 DIAGNOSIS — J301 Allergic rhinitis due to pollen: Secondary | ICD-10-CM | POA: Diagnosis not present

## 2015-11-21 DIAGNOSIS — J3089 Other allergic rhinitis: Secondary | ICD-10-CM | POA: Diagnosis not present

## 2015-11-25 HISTORY — PX: DILATATION & CURETTAGE/HYSTEROSCOPY WITH MYOSURE: SHX6511

## 2015-11-28 ENCOUNTER — Ambulatory Visit (INDEPENDENT_AMBULATORY_CARE_PROVIDER_SITE_OTHER)
Admission: RE | Admit: 2015-11-28 | Discharge: 2015-11-28 | Disposition: A | Discharge status: Home / Self Care | Payer: Medicare Other | Source: Ambulatory Visit | Attending: Family Medicine | Admitting: Family Medicine

## 2015-11-28 DIAGNOSIS — Z780 Asymptomatic menopausal state: Secondary | ICD-10-CM

## 2015-12-01 ENCOUNTER — Other Ambulatory Visit: Payer: Self-pay | Admitting: Family Medicine

## 2015-12-05 DIAGNOSIS — J3081 Allergic rhinitis due to animal (cat) (dog) hair and dander: Secondary | ICD-10-CM | POA: Diagnosis not present

## 2015-12-05 DIAGNOSIS — J301 Allergic rhinitis due to pollen: Secondary | ICD-10-CM | POA: Diagnosis not present

## 2015-12-05 DIAGNOSIS — J3089 Other allergic rhinitis: Secondary | ICD-10-CM | POA: Diagnosis not present

## 2015-12-10 ENCOUNTER — Other Ambulatory Visit: Payer: Self-pay | Admitting: Family Medicine

## 2015-12-12 DIAGNOSIS — J3089 Other allergic rhinitis: Secondary | ICD-10-CM | POA: Diagnosis not present

## 2015-12-12 DIAGNOSIS — N950 Postmenopausal bleeding: Secondary | ICD-10-CM | POA: Diagnosis not present

## 2015-12-12 DIAGNOSIS — J3081 Allergic rhinitis due to animal (cat) (dog) hair and dander: Secondary | ICD-10-CM | POA: Diagnosis not present

## 2015-12-12 DIAGNOSIS — J301 Allergic rhinitis due to pollen: Secondary | ICD-10-CM | POA: Diagnosis not present

## 2015-12-21 DIAGNOSIS — N840 Polyp of corpus uteri: Secondary | ICD-10-CM | POA: Diagnosis not present

## 2015-12-21 DIAGNOSIS — N950 Postmenopausal bleeding: Secondary | ICD-10-CM | POA: Diagnosis not present

## 2015-12-25 DIAGNOSIS — J301 Allergic rhinitis due to pollen: Secondary | ICD-10-CM | POA: Diagnosis not present

## 2015-12-25 DIAGNOSIS — J3081 Allergic rhinitis due to animal (cat) (dog) hair and dander: Secondary | ICD-10-CM | POA: Diagnosis not present

## 2015-12-25 DIAGNOSIS — J3089 Other allergic rhinitis: Secondary | ICD-10-CM | POA: Diagnosis not present

## 2015-12-27 DIAGNOSIS — H2513 Age-related nuclear cataract, bilateral: Secondary | ICD-10-CM | POA: Diagnosis not present

## 2015-12-27 DIAGNOSIS — H40013 Open angle with borderline findings, low risk, bilateral: Secondary | ICD-10-CM | POA: Diagnosis not present

## 2015-12-31 ENCOUNTER — Other Ambulatory Visit: Payer: Self-pay | Admitting: Family Medicine

## 2016-01-02 DIAGNOSIS — J301 Allergic rhinitis due to pollen: Secondary | ICD-10-CM | POA: Diagnosis not present

## 2016-01-02 DIAGNOSIS — J3081 Allergic rhinitis due to animal (cat) (dog) hair and dander: Secondary | ICD-10-CM | POA: Diagnosis not present

## 2016-01-02 DIAGNOSIS — J3089 Other allergic rhinitis: Secondary | ICD-10-CM | POA: Diagnosis not present

## 2016-01-10 DIAGNOSIS — J3089 Other allergic rhinitis: Secondary | ICD-10-CM | POA: Diagnosis not present

## 2016-01-10 DIAGNOSIS — J3081 Allergic rhinitis due to animal (cat) (dog) hair and dander: Secondary | ICD-10-CM | POA: Diagnosis not present

## 2016-01-10 DIAGNOSIS — J301 Allergic rhinitis due to pollen: Secondary | ICD-10-CM | POA: Diagnosis not present

## 2016-01-13 ENCOUNTER — Other Ambulatory Visit: Payer: Self-pay | Admitting: Emergency Medicine

## 2016-01-17 DIAGNOSIS — J301 Allergic rhinitis due to pollen: Secondary | ICD-10-CM | POA: Diagnosis not present

## 2016-01-17 DIAGNOSIS — J3081 Allergic rhinitis due to animal (cat) (dog) hair and dander: Secondary | ICD-10-CM | POA: Diagnosis not present

## 2016-01-17 DIAGNOSIS — J3089 Other allergic rhinitis: Secondary | ICD-10-CM | POA: Diagnosis not present

## 2016-01-29 DIAGNOSIS — J3089 Other allergic rhinitis: Secondary | ICD-10-CM | POA: Diagnosis not present

## 2016-01-29 DIAGNOSIS — J301 Allergic rhinitis due to pollen: Secondary | ICD-10-CM | POA: Diagnosis not present

## 2016-01-29 DIAGNOSIS — J3081 Allergic rhinitis due to animal (cat) (dog) hair and dander: Secondary | ICD-10-CM | POA: Diagnosis not present

## 2016-02-07 DIAGNOSIS — J3089 Other allergic rhinitis: Secondary | ICD-10-CM | POA: Diagnosis not present

## 2016-02-07 DIAGNOSIS — J3081 Allergic rhinitis due to animal (cat) (dog) hair and dander: Secondary | ICD-10-CM | POA: Diagnosis not present

## 2016-02-07 DIAGNOSIS — J301 Allergic rhinitis due to pollen: Secondary | ICD-10-CM | POA: Diagnosis not present

## 2016-02-19 DIAGNOSIS — J3081 Allergic rhinitis due to animal (cat) (dog) hair and dander: Secondary | ICD-10-CM | POA: Diagnosis not present

## 2016-02-19 DIAGNOSIS — J3089 Other allergic rhinitis: Secondary | ICD-10-CM | POA: Diagnosis not present

## 2016-02-19 DIAGNOSIS — J301 Allergic rhinitis due to pollen: Secondary | ICD-10-CM | POA: Diagnosis not present

## 2016-02-27 ENCOUNTER — Other Ambulatory Visit: Payer: Self-pay | Admitting: Family Medicine

## 2016-02-27 DIAGNOSIS — J301 Allergic rhinitis due to pollen: Secondary | ICD-10-CM | POA: Diagnosis not present

## 2016-02-27 DIAGNOSIS — J3089 Other allergic rhinitis: Secondary | ICD-10-CM | POA: Diagnosis not present

## 2016-02-27 MED ORDER — AMLODIPINE BESYLATE 5 MG PO TABS: ORAL_TABLET | ORAL | Status: DC

## 2016-03-13 DIAGNOSIS — J3081 Allergic rhinitis due to animal (cat) (dog) hair and dander: Secondary | ICD-10-CM | POA: Diagnosis not present

## 2016-03-13 DIAGNOSIS — J3089 Other allergic rhinitis: Secondary | ICD-10-CM | POA: Diagnosis not present

## 2016-03-13 DIAGNOSIS — J301 Allergic rhinitis due to pollen: Secondary | ICD-10-CM | POA: Diagnosis not present

## 2016-03-18 DIAGNOSIS — J3081 Allergic rhinitis due to animal (cat) (dog) hair and dander: Secondary | ICD-10-CM | POA: Diagnosis not present

## 2016-03-18 DIAGNOSIS — J3089 Other allergic rhinitis: Secondary | ICD-10-CM | POA: Diagnosis not present

## 2016-03-18 DIAGNOSIS — J301 Allergic rhinitis due to pollen: Secondary | ICD-10-CM | POA: Diagnosis not present

## 2016-03-24 DIAGNOSIS — J3089 Other allergic rhinitis: Secondary | ICD-10-CM | POA: Diagnosis not present

## 2016-03-24 DIAGNOSIS — J3081 Allergic rhinitis due to animal (cat) (dog) hair and dander: Secondary | ICD-10-CM | POA: Diagnosis not present

## 2016-03-24 DIAGNOSIS — J301 Allergic rhinitis due to pollen: Secondary | ICD-10-CM | POA: Diagnosis not present

## 2016-04-01 DIAGNOSIS — J3081 Allergic rhinitis due to animal (cat) (dog) hair and dander: Secondary | ICD-10-CM | POA: Diagnosis not present

## 2016-04-01 DIAGNOSIS — J3089 Other allergic rhinitis: Secondary | ICD-10-CM | POA: Diagnosis not present

## 2016-04-01 DIAGNOSIS — J301 Allergic rhinitis due to pollen: Secondary | ICD-10-CM | POA: Diagnosis not present

## 2016-04-11 DIAGNOSIS — J301 Allergic rhinitis due to pollen: Secondary | ICD-10-CM | POA: Diagnosis not present

## 2016-04-11 DIAGNOSIS — J3081 Allergic rhinitis due to animal (cat) (dog) hair and dander: Secondary | ICD-10-CM | POA: Diagnosis not present

## 2016-04-11 DIAGNOSIS — J3089 Other allergic rhinitis: Secondary | ICD-10-CM | POA: Diagnosis not present

## 2016-04-13 ENCOUNTER — Other Ambulatory Visit: Payer: Self-pay | Admitting: Family Medicine

## 2016-04-18 DIAGNOSIS — J301 Allergic rhinitis due to pollen: Secondary | ICD-10-CM | POA: Diagnosis not present

## 2016-04-18 DIAGNOSIS — J3089 Other allergic rhinitis: Secondary | ICD-10-CM | POA: Diagnosis not present

## 2016-04-18 DIAGNOSIS — J3081 Allergic rhinitis due to animal (cat) (dog) hair and dander: Secondary | ICD-10-CM | POA: Diagnosis not present

## 2016-04-29 DIAGNOSIS — J3081 Allergic rhinitis due to animal (cat) (dog) hair and dander: Secondary | ICD-10-CM | POA: Diagnosis not present

## 2016-04-29 DIAGNOSIS — J301 Allergic rhinitis due to pollen: Secondary | ICD-10-CM | POA: Diagnosis not present

## 2016-04-29 DIAGNOSIS — J3089 Other allergic rhinitis: Secondary | ICD-10-CM | POA: Diagnosis not present

## 2016-05-09 DIAGNOSIS — J3081 Allergic rhinitis due to animal (cat) (dog) hair and dander: Secondary | ICD-10-CM | POA: Diagnosis not present

## 2016-05-09 DIAGNOSIS — J3089 Other allergic rhinitis: Secondary | ICD-10-CM | POA: Diagnosis not present

## 2016-05-09 DIAGNOSIS — J301 Allergic rhinitis due to pollen: Secondary | ICD-10-CM | POA: Diagnosis not present

## 2016-05-20 DIAGNOSIS — J3081 Allergic rhinitis due to animal (cat) (dog) hair and dander: Secondary | ICD-10-CM | POA: Diagnosis not present

## 2016-05-20 DIAGNOSIS — J3089 Other allergic rhinitis: Secondary | ICD-10-CM | POA: Diagnosis not present

## 2016-05-20 DIAGNOSIS — J301 Allergic rhinitis due to pollen: Secondary | ICD-10-CM | POA: Diagnosis not present

## 2016-06-05 DIAGNOSIS — J301 Allergic rhinitis due to pollen: Secondary | ICD-10-CM | POA: Diagnosis not present

## 2016-06-05 DIAGNOSIS — J3089 Other allergic rhinitis: Secondary | ICD-10-CM | POA: Diagnosis not present

## 2016-06-05 DIAGNOSIS — J3081 Allergic rhinitis due to animal (cat) (dog) hair and dander: Secondary | ICD-10-CM | POA: Diagnosis not present

## 2016-06-13 DIAGNOSIS — J3081 Allergic rhinitis due to animal (cat) (dog) hair and dander: Secondary | ICD-10-CM | POA: Diagnosis not present

## 2016-06-13 DIAGNOSIS — J301 Allergic rhinitis due to pollen: Secondary | ICD-10-CM | POA: Diagnosis not present

## 2016-06-13 DIAGNOSIS — J3089 Other allergic rhinitis: Secondary | ICD-10-CM | POA: Diagnosis not present

## 2016-06-17 ENCOUNTER — Other Ambulatory Visit: Payer: Self-pay

## 2016-06-17 DIAGNOSIS — J301 Allergic rhinitis due to pollen: Secondary | ICD-10-CM | POA: Diagnosis not present

## 2016-06-17 DIAGNOSIS — J3081 Allergic rhinitis due to animal (cat) (dog) hair and dander: Secondary | ICD-10-CM | POA: Diagnosis not present

## 2016-06-17 DIAGNOSIS — J3089 Other allergic rhinitis: Secondary | ICD-10-CM | POA: Diagnosis not present

## 2016-06-17 MED ORDER — CITALOPRAM HYDROBROMIDE 20 MG PO TABS: 20.0000 mg | ORAL_TABLET | Freq: Every day | ORAL | 1 refills | Status: DC

## 2016-06-19 DIAGNOSIS — J3089 Other allergic rhinitis: Secondary | ICD-10-CM | POA: Diagnosis not present

## 2016-06-19 DIAGNOSIS — J3081 Allergic rhinitis due to animal (cat) (dog) hair and dander: Secondary | ICD-10-CM | POA: Diagnosis not present

## 2016-06-19 DIAGNOSIS — J301 Allergic rhinitis due to pollen: Secondary | ICD-10-CM | POA: Diagnosis not present

## 2016-07-01 DIAGNOSIS — J3089 Other allergic rhinitis: Secondary | ICD-10-CM | POA: Diagnosis not present

## 2016-07-01 DIAGNOSIS — J3081 Allergic rhinitis due to animal (cat) (dog) hair and dander: Secondary | ICD-10-CM | POA: Diagnosis not present

## 2016-07-01 DIAGNOSIS — J301 Allergic rhinitis due to pollen: Secondary | ICD-10-CM | POA: Diagnosis not present

## 2016-07-09 DIAGNOSIS — J301 Allergic rhinitis due to pollen: Secondary | ICD-10-CM | POA: Diagnosis not present

## 2016-07-09 DIAGNOSIS — J3081 Allergic rhinitis due to animal (cat) (dog) hair and dander: Secondary | ICD-10-CM | POA: Diagnosis not present

## 2016-07-09 DIAGNOSIS — J3089 Other allergic rhinitis: Secondary | ICD-10-CM | POA: Diagnosis not present

## 2016-07-11 ENCOUNTER — Other Ambulatory Visit: Payer: Self-pay | Admitting: Family Medicine

## 2016-07-16 ENCOUNTER — Encounter: Payer: Self-pay | Admitting: Family Medicine

## 2016-07-17 NOTE — Telephone Encounter (Signed)
Pt states she is not feeling any worse, and is very busy this afternoon so cannot make an appointment.  Pt leaving to go out of town today, so will just wait and see how she feels over the weekend.

## 2016-07-21 ENCOUNTER — Other Ambulatory Visit: Payer: Self-pay | Admitting: Family Medicine

## 2016-07-21 DIAGNOSIS — J301 Allergic rhinitis due to pollen: Secondary | ICD-10-CM | POA: Diagnosis not present

## 2016-07-21 DIAGNOSIS — J3081 Allergic rhinitis due to animal (cat) (dog) hair and dander: Secondary | ICD-10-CM | POA: Diagnosis not present

## 2016-07-21 DIAGNOSIS — J3089 Other allergic rhinitis: Secondary | ICD-10-CM | POA: Diagnosis not present

## 2016-07-21 NOTE — Telephone Encounter (Signed)
Rx refill sent to pharmacy. 

## 2016-07-29 ENCOUNTER — Other Ambulatory Visit: Payer: Self-pay | Admitting: Family Medicine

## 2016-07-29 DIAGNOSIS — Z1231 Encounter for screening mammogram for malignant neoplasm of breast: Secondary | ICD-10-CM

## 2016-07-29 DIAGNOSIS — H40013 Open angle with borderline findings, low risk, bilateral: Secondary | ICD-10-CM | POA: Diagnosis not present

## 2016-07-29 DIAGNOSIS — Z853 Personal history of malignant neoplasm of breast: Secondary | ICD-10-CM

## 2016-08-01 DIAGNOSIS — J3089 Other allergic rhinitis: Secondary | ICD-10-CM | POA: Diagnosis not present

## 2016-08-01 DIAGNOSIS — J301 Allergic rhinitis due to pollen: Secondary | ICD-10-CM | POA: Diagnosis not present

## 2016-08-01 DIAGNOSIS — J3081 Allergic rhinitis due to animal (cat) (dog) hair and dander: Secondary | ICD-10-CM | POA: Diagnosis not present

## 2016-08-06 ENCOUNTER — Other Ambulatory Visit: Payer: Self-pay | Admitting: Family Medicine

## 2016-08-06 NOTE — Telephone Encounter (Signed)
Refill once 

## 2016-08-08 ENCOUNTER — Ambulatory Visit (INDEPENDENT_AMBULATORY_CARE_PROVIDER_SITE_OTHER): Payer: Medicare Other | Admitting: Family Medicine

## 2016-08-08 VITALS — BP 110/80 | HR 59 | Temp 97.9°F | Ht 64.0 in | Wt 166.0 lb

## 2016-08-08 DIAGNOSIS — J301 Allergic rhinitis due to pollen: Secondary | ICD-10-CM | POA: Diagnosis not present

## 2016-08-08 DIAGNOSIS — J3089 Other allergic rhinitis: Secondary | ICD-10-CM | POA: Diagnosis not present

## 2016-08-08 DIAGNOSIS — J3081 Allergic rhinitis due to animal (cat) (dog) hair and dander: Secondary | ICD-10-CM | POA: Diagnosis not present

## 2016-08-08 DIAGNOSIS — J029 Acute pharyngitis, unspecified: Secondary | ICD-10-CM | POA: Diagnosis not present

## 2016-08-08 NOTE — Progress Notes (Signed)
Pre visit review using our clinic review tool, if applicable. No additional management support is needed unless otherwise documented below in the visit note. 

## 2016-08-08 NOTE — Progress Notes (Signed)
Subjective:     Patient ID: Theresa Bradley, female   DOB: 11/05/1951, 65 y.o.   MRN: [id number]  HPI Here to evaluate sore throat. Intermittent for 3 weeks. She was concerned because grandson and son had diagnosis strep recently. She has not had any headache, fever, chills. She's not noted any exudate. She has occasional somewhat chronic postnasal drip symptoms.  Past Medical History:  Diagnosis Date  . COUGH, CHRONIC 05/10/2010  . HYPERTENSION 04/16/2009  . HYPOTHYROIDISM 04/16/2009  . OSTEOARTHRITIS 04/16/2009  . SENILE LENTIGO 06/10/2010   Past Surgical History:  Procedure Laterality Date  . BREAST SURGERY  2009   X 2, cancer stage 0, lumpectomy  . [place]  . CHOLECYSTECTOMY    . HERNIA REPAIR  2009  . JOINT REPLACEMENT  2008   hip    reports that she has never smoked. She does not have any smokeless tobacco history on file. Her alcohol and drug histories are not on file. family history includes Arthritis in her other; Cancer in her other; Hyperlipidemia in her father; Hypertension in her other. Allergies  Allergen Reactions  . Codeine Sulfate Nausea And Vomiting  . Erythromycin Base Other (See Comments)    Stomach ache  . Esomeprazole Magnesium     REACTION: GI upset     Review of Systems  Constitutional: Negative for chills and fever.  HENT: Positive for sore throat.   Respiratory: Negative for cough.        Objective:   Physical Exam  Constitutional: She appears well-developed and well-nourished.  HENT:  Right Ear: External ear normal.  Left Ear: External ear normal.  Mouth/Throat: Oropharynx is clear and moist.  Neck: Neck supple.  Cardiovascular: Normal rate and regular rhythm.   Pulmonary/Chest: Effort normal and breath sounds normal. No respiratory distress. She has no wheezes. She has no rales.  Lymphadenopathy:    She has no cervical adenopathy.       Assessment:     Pharyngitis. Low clinical suspicion for strep    Plan:      -Rapid strep negative -Consider treatment with over-the-counter medication for postnasal drip symptoms -Schedule complete physical  [name] MD [facility] at [facility]

## 2016-08-08 NOTE — Patient Instructions (Signed)

## 2016-08-10 LAB — POCT RAPID STREP A (OFFICE): RAPID STREP A SCREEN: NEGATIVE

## 2016-08-12 ENCOUNTER — Ambulatory Visit
Admission: RE | Admit: 2016-08-12 | Discharge: 2016-08-12 | Disposition: A | Discharge status: Home / Self Care | Payer: Medicare Other | Source: Ambulatory Visit | Attending: Family Medicine | Admitting: Family Medicine

## 2016-08-12 DIAGNOSIS — Z1231 Encounter for screening mammogram for malignant neoplasm of breast: Secondary | ICD-10-CM | POA: Diagnosis not present

## 2016-08-12 DIAGNOSIS — Z853 Personal history of malignant neoplasm of breast: Secondary | ICD-10-CM

## 2016-08-14 ENCOUNTER — Ambulatory Visit (INDEPENDENT_AMBULATORY_CARE_PROVIDER_SITE_OTHER): Payer: Medicare Other

## 2016-08-14 DIAGNOSIS — Z23 Encounter for immunization: Secondary | ICD-10-CM | POA: Diagnosis not present

## 2016-08-18 DIAGNOSIS — J301 Allergic rhinitis due to pollen: Secondary | ICD-10-CM | POA: Diagnosis not present

## 2016-08-18 DIAGNOSIS — J3089 Other allergic rhinitis: Secondary | ICD-10-CM | POA: Diagnosis not present

## 2016-08-18 DIAGNOSIS — J3081 Allergic rhinitis due to animal (cat) (dog) hair and dander: Secondary | ICD-10-CM | POA: Diagnosis not present

## 2016-08-24 ENCOUNTER — Other Ambulatory Visit: Payer: Self-pay | Admitting: Family Medicine

## 2016-08-26 DIAGNOSIS — I788 Other diseases of capillaries: Secondary | ICD-10-CM | POA: Diagnosis not present

## 2016-08-26 DIAGNOSIS — L814 Other melanin hyperpigmentation: Secondary | ICD-10-CM | POA: Diagnosis not present

## 2016-08-26 DIAGNOSIS — L817 Pigmented purpuric dermatosis: Secondary | ICD-10-CM | POA: Diagnosis not present

## 2016-08-29 DIAGNOSIS — J3081 Allergic rhinitis due to animal (cat) (dog) hair and dander: Secondary | ICD-10-CM | POA: Diagnosis not present

## 2016-08-29 DIAGNOSIS — J301 Allergic rhinitis due to pollen: Secondary | ICD-10-CM | POA: Diagnosis not present

## 2016-08-29 DIAGNOSIS — J3089 Other allergic rhinitis: Secondary | ICD-10-CM | POA: Diagnosis not present

## 2016-09-10 DIAGNOSIS — J3081 Allergic rhinitis due to animal (cat) (dog) hair and dander: Secondary | ICD-10-CM | POA: Diagnosis not present

## 2016-09-10 DIAGNOSIS — J3089 Other allergic rhinitis: Secondary | ICD-10-CM | POA: Diagnosis not present

## 2016-09-10 DIAGNOSIS — J301 Allergic rhinitis due to pollen: Secondary | ICD-10-CM | POA: Diagnosis not present

## 2016-09-15 DIAGNOSIS — J3081 Allergic rhinitis due to animal (cat) (dog) hair and dander: Secondary | ICD-10-CM | POA: Diagnosis not present

## 2016-09-15 DIAGNOSIS — J301 Allergic rhinitis due to pollen: Secondary | ICD-10-CM | POA: Diagnosis not present

## 2016-09-15 DIAGNOSIS — J3089 Other allergic rhinitis: Secondary | ICD-10-CM | POA: Diagnosis not present

## 2016-09-22 DIAGNOSIS — J301 Allergic rhinitis due to pollen: Secondary | ICD-10-CM | POA: Diagnosis not present

## 2016-09-22 DIAGNOSIS — J3089 Other allergic rhinitis: Secondary | ICD-10-CM | POA: Diagnosis not present

## 2016-09-22 DIAGNOSIS — J3081 Allergic rhinitis due to animal (cat) (dog) hair and dander: Secondary | ICD-10-CM | POA: Diagnosis not present

## 2016-10-01 DIAGNOSIS — J3089 Other allergic rhinitis: Secondary | ICD-10-CM | POA: Diagnosis not present

## 2016-10-01 DIAGNOSIS — J3081 Allergic rhinitis due to animal (cat) (dog) hair and dander: Secondary | ICD-10-CM | POA: Diagnosis not present

## 2016-10-01 DIAGNOSIS — J301 Allergic rhinitis due to pollen: Secondary | ICD-10-CM | POA: Diagnosis not present

## 2016-10-07 DIAGNOSIS — J3081 Allergic rhinitis due to animal (cat) (dog) hair and dander: Secondary | ICD-10-CM | POA: Diagnosis not present

## 2016-10-07 DIAGNOSIS — J3089 Other allergic rhinitis: Secondary | ICD-10-CM | POA: Diagnosis not present

## 2016-10-07 DIAGNOSIS — J301 Allergic rhinitis due to pollen: Secondary | ICD-10-CM | POA: Diagnosis not present

## 2016-10-14 DIAGNOSIS — J301 Allergic rhinitis due to pollen: Secondary | ICD-10-CM | POA: Diagnosis not present

## 2016-10-14 DIAGNOSIS — J3081 Allergic rhinitis due to animal (cat) (dog) hair and dander: Secondary | ICD-10-CM | POA: Diagnosis not present

## 2016-10-14 DIAGNOSIS — J3089 Other allergic rhinitis: Secondary | ICD-10-CM | POA: Diagnosis not present

## 2016-10-22 DIAGNOSIS — J3089 Other allergic rhinitis: Secondary | ICD-10-CM | POA: Diagnosis not present

## 2016-10-22 DIAGNOSIS — J301 Allergic rhinitis due to pollen: Secondary | ICD-10-CM | POA: Diagnosis not present

## 2016-10-22 DIAGNOSIS — J3081 Allergic rhinitis due to animal (cat) (dog) hair and dander: Secondary | ICD-10-CM | POA: Diagnosis not present

## 2016-11-02 ENCOUNTER — Other Ambulatory Visit: Payer: Self-pay | Admitting: Family Medicine

## 2016-11-03 DIAGNOSIS — J3089 Other allergic rhinitis: Secondary | ICD-10-CM | POA: Diagnosis not present

## 2016-11-03 DIAGNOSIS — J3081 Allergic rhinitis due to animal (cat) (dog) hair and dander: Secondary | ICD-10-CM | POA: Diagnosis not present

## 2016-11-03 DIAGNOSIS — J301 Allergic rhinitis due to pollen: Secondary | ICD-10-CM | POA: Diagnosis not present

## 2016-11-12 DIAGNOSIS — J301 Allergic rhinitis due to pollen: Secondary | ICD-10-CM | POA: Diagnosis not present

## 2016-11-12 DIAGNOSIS — J3081 Allergic rhinitis due to animal (cat) (dog) hair and dander: Secondary | ICD-10-CM | POA: Diagnosis not present

## 2016-11-12 DIAGNOSIS — J3089 Other allergic rhinitis: Secondary | ICD-10-CM | POA: Diagnosis not present

## 2016-11-25 DIAGNOSIS — J301 Allergic rhinitis due to pollen: Secondary | ICD-10-CM | POA: Diagnosis not present

## 2016-11-25 DIAGNOSIS — J3089 Other allergic rhinitis: Secondary | ICD-10-CM | POA: Diagnosis not present

## 2016-11-25 DIAGNOSIS — J3081 Allergic rhinitis due to animal (cat) (dog) hair and dander: Secondary | ICD-10-CM | POA: Diagnosis not present

## 2016-11-27 ENCOUNTER — Other Ambulatory Visit (INDEPENDENT_AMBULATORY_CARE_PROVIDER_SITE_OTHER): Payer: Medicare Other

## 2016-11-27 DIAGNOSIS — Z0000 Encounter for general adult medical examination without abnormal findings: Secondary | ICD-10-CM

## 2016-11-27 LAB — CBC WITH DIFFERENTIAL/PLATELET
Basophils Absolute: 0 10*3/uL (ref 0.0–0.1)
Basophils Relative: 0.6 % (ref 0.0–3.0)
EOS PCT: 4.2 % (ref 0.0–5.0)
Eosinophils Absolute: 0.3 10*3/uL (ref 0.0–0.7)
HEMATOCRIT: 41.8 % (ref 36.0–46.0)
Hemoglobin: 13.9 g/dL (ref 12.0–15.0)
LYMPHS PCT: 22.7 % (ref 12.0–46.0)
Lymphs Abs: 1.5 10*3/uL (ref 0.7–4.0)
MCHC: 33.2 g/dL (ref 30.0–36.0)
MCV: 87.3 fl (ref 78.0–100.0)
MONOS PCT: 8.5 % (ref 3.0–12.0)
Monocytes Absolute: 0.5 10*3/uL (ref 0.1–1.0)
NEUTROS PCT: 64 % (ref 43.0–77.0)
Neutro Abs: 4.1 10*3/uL (ref 1.4–7.7)
Platelets: 444 10*3/uL — ABNORMAL HIGH (ref 150.0–400.0)
RBC: 4.78 Mil/uL (ref 3.87–5.11)
RDW: 13.8 % (ref 11.5–15.5)
WBC: 6.4 10*3/uL (ref 4.0–10.5)

## 2016-11-27 LAB — BASIC METABOLIC PANEL
BUN: 12 mg/dL (ref 6–23)
CO2: 33 meq/L — AB (ref 19–32)
Calcium: 9.4 mg/dL (ref 8.4–10.5)
Chloride: 103 mEq/L (ref 96–112)
Creatinine, Ser: 0.81 mg/dL (ref 0.40–1.20)
GFR: 74.87 mL/min (ref >60.00)
Glucose, Bld: 96 mg/dL (ref 70–99)
POTASSIUM: 3.7 meq/L (ref 3.5–5.1)
Sodium: 142 mEq/L (ref 135–145)

## 2016-11-27 LAB — HEPATIC FUNCTION PANEL
ALBUMIN: 4.2 g/dL (ref 3.5–5.2)
ALK PHOS: 62 U/L (ref 39–117)
ALT: 18 U/L (ref 0–35)
AST: 16 U/L (ref 0–37)
Bilirubin, Direct: 0.1 mg/dL (ref 0.0–0.3)
TOTAL PROTEIN: 7.2 g/dL (ref 6.0–8.3)
Total Bilirubin: 0.6 mg/dL (ref 0.2–1.2)

## 2016-11-27 LAB — LIPID PANEL
CHOLESTEROL: 201 mg/dL — AB (ref 0–200)
HDL: 46 mg/dL (ref >39.00)
LDL Cholesterol: 123 mg/dL — ABNORMAL HIGH (ref 0–99)
NONHDL: 155.1
Total CHOL/HDL Ratio: 4
Triglycerides: 162 mg/dL — ABNORMAL HIGH (ref 0.0–149.0)
VLDL: 32.4 mg/dL (ref 0.0–40.0)

## 2016-11-27 LAB — TSH: TSH: 1.83 u[IU]/mL (ref 0.35–4.50)

## 2016-12-02 ENCOUNTER — Encounter: Payer: Self-pay | Admitting: Family Medicine

## 2016-12-02 ENCOUNTER — Ambulatory Visit (INDEPENDENT_AMBULATORY_CARE_PROVIDER_SITE_OTHER): Payer: Medicare Other | Admitting: Family Medicine

## 2016-12-02 VITALS — BP 120/70 | HR 85 | Temp 98.5°F | Ht 64.0 in | Wt 164.9 lb

## 2016-12-02 DIAGNOSIS — J019 Acute sinusitis, unspecified: Secondary | ICD-10-CM | POA: Diagnosis not present

## 2016-12-02 DIAGNOSIS — Z0000 Encounter for general adult medical examination without abnormal findings: Secondary | ICD-10-CM

## 2016-12-02 MED ORDER — LEVOTHYROXINE SODIUM 50 MCG PO TABS: 50.0000 ug | ORAL_TABLET | Freq: Every day | ORAL | 3 refills | Status: DC

## 2016-12-02 MED ORDER — AMOXICILLIN-POT CLAVULANATE 875-125 MG PO TABS: 1.0000 | ORAL_TABLET | Freq: Two times a day (BID) | ORAL | 0 refills | Status: AC

## 2016-12-02 NOTE — Patient Instructions (Signed)
Try reducing the Protonix to one every other day and use OTC Zantac 150 mg or Pepcid 20 mg on the days you don't take the Protonix After two to three weeks (if no breakthrough symptoms) try going to daily Zantac or Pepcid.

## 2016-12-02 NOTE — Progress Notes (Signed)
Pre visit review using our clinic review tool, if applicable. No additional management support is needed unless otherwise documented below in the visit note. 

## 2016-12-02 NOTE — Progress Notes (Signed)
Subjective:     Patient ID: Theresa Bradley, female   DOB: [birth year], 66 y.o.   MRN: [id number]  HPI Patient seen for physical. She had some postmenopausal vaginal bleeding little over year ago. She had some endometrial thickening and underwent D&C but no evidence for cancer. She's had absolutely no vaginal spotting since then. Health maintenance is up-to-date. She just had recent mammogram in September which was normal. Colonoscopy up-to-date and will be due in a couple years. She has hypertension, GERD, hypothyroidism, osteoarthritis. She has been on Protonix for 5 years but has never tried tapering off. She got placed on this more or less for chronic cough suspected related to silent GERD. No recent cough.  She does relate at least 2 and possibly 3 week history of progressive sinus pressure. Occasional headaches. She has some maxillary and frontal sinus pressure and bloody and greenish nasal discharge. Increased malaise. No relief with over-the-counter medications.  Past Medical History:  Diagnosis Date  . COUGH, CHRONIC 05/10/2010  . HYPERTENSION 04/16/2009  . HYPOTHYROIDISM 04/16/2009  . OSTEOARTHRITIS 04/16/2009  . SENILE LENTIGO 06/10/2010   Past Surgical History:  Procedure Laterality Date  . BREAST SURGERY  2009   X 2, cancer stage 0, lumpectomy  . [place]  . CHOLECYSTECTOMY    . DILATATION & CURETTAGE/HYSTEROSCOPY WITH MYOSURE  11/2015  . HERNIA REPAIR  2009  . JOINT REPLACEMENT  2008   hip    reports that she has never smoked. She has never used smokeless tobacco. She reports that she does not use drugs. Her alcohol history is not on file. family history includes Arthritis in her other; Cancer in her other; Hyperlipidemia in her father; Hypertension in her other. Allergies  Allergen Reactions  . Codeine Sulfate Nausea And Vomiting  . Erythromycin Base Other (See Comments)    Stomach ache  . Esomeprazole Magnesium     REACTION: GI upset     Review  of Systems  Constitutional: Negative for activity change, appetite change, fatigue, fever and unexpected weight change.  HENT: Positive for congestion, sinus pain and sinus pressure. Negative for ear pain, hearing loss, sore throat and trouble swallowing.   Eyes: Negative for visual disturbance.  Respiratory: Negative for cough and shortness of breath.   Cardiovascular: Negative for chest pain and palpitations.  Gastrointestinal: Negative for abdominal pain, blood in stool, constipation and diarrhea.  Genitourinary: Negative for dysuria and hematuria.  Musculoskeletal: Positive for arthralgias. Negative for back pain and myalgias.  Skin: Negative for rash.  Neurological: Positive for headaches. Negative for dizziness and syncope.  Hematological: Negative for adenopathy.  Psychiatric/Behavioral: Negative for confusion and dysphoric mood.       Objective:   Physical Exam  Constitutional: She is oriented to person, place, and time. She appears well-developed and well-nourished.  HENT:  Head: Normocephalic and atraumatic.  Erythematous nasal mucosa otherwise clear  Eyes: EOM are normal. Pupils are equal, round, and reactive to light.  Neck: Normal range of motion. Neck supple. No thyromegaly present.  Cardiovascular: Normal rate, regular rhythm and normal heart sounds.   No murmur heard. Pulmonary/Chest: Breath sounds normal. No respiratory distress. She has no wheezes. She has no rales.  Abdominal: Soft. Bowel sounds are normal. She exhibits no distension and no mass. There is no tenderness. There is no rebound and no guarding.  Musculoskeletal: Normal range of motion. She exhibits no edema.  Lymphadenopathy:    She has no cervical adenopathy.  Neurological: She  is alert and oriented to person, place, and time. She displays normal reflexes. No cranial nerve deficit.  Skin: No rash noted.  Psychiatric: She has a normal mood and affect. Her behavior is normal. Judgment and thought content  normal.       Assessment:     #1 physical exam. Health maintenance up-to-date. She had Pap smear last year per gynecology which was normal. Labs reviewed. No major concerns. Mild dyslipidemia  #2 acute sinusitis    Plan:     -Given duration of sinus symptoms, start Augmentin 875 mg twice daily for 10 days -We discussed trying to taper off Protonix. She'll try going to every other day and supplement as needed with Zantac or Pepcid. After 2-3 weeks if no breakthrough symptoms try transitioning to Zantac or Pepcid over-the-counter -Continue regular vitamin D and calcium -We'll need repeat colonoscopy in about 2 years -She'll continue with yearly mammograms and yearly flu vaccination  [name] MD [facility] at [facility]

## 2016-12-08 ENCOUNTER — Other Ambulatory Visit: Payer: Self-pay | Admitting: Family Medicine

## 2016-12-09 NOTE — Telephone Encounter (Signed)
Last OV 12-02-2016 Last refill 08-07-2016 #[id number], 0rf Please advise

## 2016-12-10 NOTE — Telephone Encounter (Signed)
Refill OK.  Avoid regular use. 

## 2016-12-12 DIAGNOSIS — J3081 Allergic rhinitis due to animal (cat) (dog) hair and dander: Secondary | ICD-10-CM | POA: Diagnosis not present

## 2016-12-12 DIAGNOSIS — J3089 Other allergic rhinitis: Secondary | ICD-10-CM | POA: Diagnosis not present

## 2016-12-12 DIAGNOSIS — J301 Allergic rhinitis due to pollen: Secondary | ICD-10-CM | POA: Diagnosis not present

## 2016-12-12 DIAGNOSIS — R05 Cough: Secondary | ICD-10-CM | POA: Diagnosis not present

## 2016-12-26 DIAGNOSIS — J3089 Other allergic rhinitis: Secondary | ICD-10-CM | POA: Diagnosis not present

## 2016-12-26 DIAGNOSIS — J3081 Allergic rhinitis due to animal (cat) (dog) hair and dander: Secondary | ICD-10-CM | POA: Diagnosis not present

## 2016-12-26 DIAGNOSIS — J301 Allergic rhinitis due to pollen: Secondary | ICD-10-CM | POA: Diagnosis not present

## 2017-01-07 DIAGNOSIS — J3081 Allergic rhinitis due to animal (cat) (dog) hair and dander: Secondary | ICD-10-CM | POA: Diagnosis not present

## 2017-01-07 DIAGNOSIS — J3089 Other allergic rhinitis: Secondary | ICD-10-CM | POA: Diagnosis not present

## 2017-01-07 DIAGNOSIS — J301 Allergic rhinitis due to pollen: Secondary | ICD-10-CM | POA: Diagnosis not present

## 2017-01-14 ENCOUNTER — Other Ambulatory Visit: Payer: Self-pay | Admitting: Family Medicine

## 2017-01-19 DIAGNOSIS — J3081 Allergic rhinitis due to animal (cat) (dog) hair and dander: Secondary | ICD-10-CM | POA: Diagnosis not present

## 2017-01-19 DIAGNOSIS — J301 Allergic rhinitis due to pollen: Secondary | ICD-10-CM | POA: Diagnosis not present

## 2017-01-19 DIAGNOSIS — J3089 Other allergic rhinitis: Secondary | ICD-10-CM | POA: Diagnosis not present

## 2017-02-04 DIAGNOSIS — H40013 Open angle with borderline findings, low risk, bilateral: Secondary | ICD-10-CM | POA: Diagnosis not present

## 2017-02-04 DIAGNOSIS — J3089 Other allergic rhinitis: Secondary | ICD-10-CM | POA: Diagnosis not present

## 2017-02-04 DIAGNOSIS — J3081 Allergic rhinitis due to animal (cat) (dog) hair and dander: Secondary | ICD-10-CM | POA: Diagnosis not present

## 2017-02-04 DIAGNOSIS — J301 Allergic rhinitis due to pollen: Secondary | ICD-10-CM | POA: Diagnosis not present

## 2017-02-04 DIAGNOSIS — H2513 Age-related nuclear cataract, bilateral: Secondary | ICD-10-CM | POA: Diagnosis not present

## 2017-02-16 ENCOUNTER — Other Ambulatory Visit: Payer: Self-pay | Admitting: Family Medicine

## 2017-02-16 NOTE — Telephone Encounter (Signed)
Last refill 11/22/16 last office visit 12/02/16  Okay to fill?

## 2017-02-16 NOTE — Telephone Encounter (Signed)
Refill OK

## 2017-02-17 DIAGNOSIS — J3089 Other allergic rhinitis: Secondary | ICD-10-CM | POA: Diagnosis not present

## 2017-02-17 DIAGNOSIS — J3081 Allergic rhinitis due to animal (cat) (dog) hair and dander: Secondary | ICD-10-CM | POA: Diagnosis not present

## 2017-02-17 DIAGNOSIS — J301 Allergic rhinitis due to pollen: Secondary | ICD-10-CM | POA: Diagnosis not present

## 2017-02-19 DIAGNOSIS — J301 Allergic rhinitis due to pollen: Secondary | ICD-10-CM | POA: Diagnosis not present

## 2017-02-19 DIAGNOSIS — J3081 Allergic rhinitis due to animal (cat) (dog) hair and dander: Secondary | ICD-10-CM | POA: Diagnosis not present

## 2017-02-19 DIAGNOSIS — J3089 Other allergic rhinitis: Secondary | ICD-10-CM | POA: Diagnosis not present

## 2017-02-24 DIAGNOSIS — D2261 Melanocytic nevi of right upper limb, including shoulder: Secondary | ICD-10-CM | POA: Diagnosis not present

## 2017-02-24 DIAGNOSIS — L298 Other pruritus: Secondary | ICD-10-CM | POA: Diagnosis not present

## 2017-02-24 DIAGNOSIS — L821 Other seborrheic keratosis: Secondary | ICD-10-CM | POA: Diagnosis not present

## 2017-02-24 DIAGNOSIS — D225 Melanocytic nevi of trunk: Secondary | ICD-10-CM | POA: Diagnosis not present

## 2017-02-24 DIAGNOSIS — D2262 Melanocytic nevi of left upper limb, including shoulder: Secondary | ICD-10-CM | POA: Diagnosis not present

## 2017-02-25 DIAGNOSIS — J3081 Allergic rhinitis due to animal (cat) (dog) hair and dander: Secondary | ICD-10-CM | POA: Diagnosis not present

## 2017-02-25 DIAGNOSIS — J3089 Other allergic rhinitis: Secondary | ICD-10-CM | POA: Diagnosis not present

## 2017-02-25 DIAGNOSIS — J301 Allergic rhinitis due to pollen: Secondary | ICD-10-CM | POA: Diagnosis not present

## 2017-03-10 DIAGNOSIS — J3089 Other allergic rhinitis: Secondary | ICD-10-CM | POA: Diagnosis not present

## 2017-03-10 DIAGNOSIS — J301 Allergic rhinitis due to pollen: Secondary | ICD-10-CM | POA: Diagnosis not present

## 2017-03-10 DIAGNOSIS — J3081 Allergic rhinitis due to animal (cat) (dog) hair and dander: Secondary | ICD-10-CM | POA: Diagnosis not present

## 2017-03-26 DIAGNOSIS — J301 Allergic rhinitis due to pollen: Secondary | ICD-10-CM | POA: Diagnosis not present

## 2017-03-26 DIAGNOSIS — J3081 Allergic rhinitis due to animal (cat) (dog) hair and dander: Secondary | ICD-10-CM | POA: Diagnosis not present

## 2017-03-26 DIAGNOSIS — J3089 Other allergic rhinitis: Secondary | ICD-10-CM | POA: Diagnosis not present

## 2017-04-01 DIAGNOSIS — J3081 Allergic rhinitis due to animal (cat) (dog) hair and dander: Secondary | ICD-10-CM | POA: Diagnosis not present

## 2017-04-01 DIAGNOSIS — J301 Allergic rhinitis due to pollen: Secondary | ICD-10-CM | POA: Diagnosis not present

## 2017-04-01 DIAGNOSIS — J3089 Other allergic rhinitis: Secondary | ICD-10-CM | POA: Diagnosis not present

## 2017-04-02 ENCOUNTER — Other Ambulatory Visit: Payer: Self-pay | Admitting: Family Medicine

## 2017-04-02 NOTE — Telephone Encounter (Signed)
Last Rx given on 1/19 for #60 with no ref

## 2017-04-02 NOTE — Telephone Encounter (Signed)
Rx done and I called the pt and informed her of the message below. 

## 2017-04-02 NOTE — Telephone Encounter (Signed)
Refill once.  Would like for her to try tapering off this  Can discuss at next follow up further.

## 2017-04-07 DIAGNOSIS — J3081 Allergic rhinitis due to animal (cat) (dog) hair and dander: Secondary | ICD-10-CM | POA: Diagnosis not present

## 2017-04-07 DIAGNOSIS — J3089 Other allergic rhinitis: Secondary | ICD-10-CM | POA: Diagnosis not present

## 2017-04-07 DIAGNOSIS — J301 Allergic rhinitis due to pollen: Secondary | ICD-10-CM | POA: Diagnosis not present

## 2017-04-14 DIAGNOSIS — J3089 Other allergic rhinitis: Secondary | ICD-10-CM | POA: Diagnosis not present

## 2017-04-14 DIAGNOSIS — J301 Allergic rhinitis due to pollen: Secondary | ICD-10-CM | POA: Diagnosis not present

## 2017-04-14 DIAGNOSIS — J3081 Allergic rhinitis due to animal (cat) (dog) hair and dander: Secondary | ICD-10-CM | POA: Diagnosis not present

## 2017-04-21 DIAGNOSIS — J301 Allergic rhinitis due to pollen: Secondary | ICD-10-CM | POA: Diagnosis not present

## 2017-04-21 DIAGNOSIS — J3089 Other allergic rhinitis: Secondary | ICD-10-CM | POA: Diagnosis not present

## 2017-04-21 DIAGNOSIS — J3081 Allergic rhinitis due to animal (cat) (dog) hair and dander: Secondary | ICD-10-CM | POA: Diagnosis not present

## 2017-04-28 DIAGNOSIS — J3081 Allergic rhinitis due to animal (cat) (dog) hair and dander: Secondary | ICD-10-CM | POA: Diagnosis not present

## 2017-04-28 DIAGNOSIS — J301 Allergic rhinitis due to pollen: Secondary | ICD-10-CM | POA: Diagnosis not present

## 2017-04-28 DIAGNOSIS — J3089 Other allergic rhinitis: Secondary | ICD-10-CM | POA: Diagnosis not present

## 2017-05-12 DIAGNOSIS — J301 Allergic rhinitis due to pollen: Secondary | ICD-10-CM | POA: Diagnosis not present

## 2017-05-12 DIAGNOSIS — J3081 Allergic rhinitis due to animal (cat) (dog) hair and dander: Secondary | ICD-10-CM | POA: Diagnosis not present

## 2017-05-12 DIAGNOSIS — J3089 Other allergic rhinitis: Secondary | ICD-10-CM | POA: Diagnosis not present

## 2017-05-15 DIAGNOSIS — J301 Allergic rhinitis due to pollen: Secondary | ICD-10-CM | POA: Diagnosis not present

## 2017-05-15 DIAGNOSIS — J3081 Allergic rhinitis due to animal (cat) (dog) hair and dander: Secondary | ICD-10-CM | POA: Diagnosis not present

## 2017-05-20 DIAGNOSIS — J301 Allergic rhinitis due to pollen: Secondary | ICD-10-CM | POA: Diagnosis not present

## 2017-05-20 DIAGNOSIS — J3089 Other allergic rhinitis: Secondary | ICD-10-CM | POA: Diagnosis not present

## 2017-05-20 DIAGNOSIS — J3081 Allergic rhinitis due to animal (cat) (dog) hair and dander: Secondary | ICD-10-CM | POA: Diagnosis not present

## 2017-05-29 ENCOUNTER — Encounter: Payer: Self-pay | Admitting: Family Medicine

## 2017-06-01 DIAGNOSIS — J3081 Allergic rhinitis due to animal (cat) (dog) hair and dander: Secondary | ICD-10-CM | POA: Diagnosis not present

## 2017-06-01 DIAGNOSIS — J301 Allergic rhinitis due to pollen: Secondary | ICD-10-CM | POA: Diagnosis not present

## 2017-06-01 DIAGNOSIS — J3089 Other allergic rhinitis: Secondary | ICD-10-CM | POA: Diagnosis not present

## 2017-06-05 DIAGNOSIS — Z23 Encounter for immunization: Secondary | ICD-10-CM | POA: Diagnosis not present

## 2017-06-15 DIAGNOSIS — J301 Allergic rhinitis due to pollen: Secondary | ICD-10-CM | POA: Diagnosis not present

## 2017-06-15 DIAGNOSIS — J3089 Other allergic rhinitis: Secondary | ICD-10-CM | POA: Diagnosis not present

## 2017-06-15 DIAGNOSIS — J3081 Allergic rhinitis due to animal (cat) (dog) hair and dander: Secondary | ICD-10-CM | POA: Diagnosis not present

## 2017-06-24 DIAGNOSIS — J3089 Other allergic rhinitis: Secondary | ICD-10-CM | POA: Diagnosis not present

## 2017-06-24 DIAGNOSIS — J301 Allergic rhinitis due to pollen: Secondary | ICD-10-CM | POA: Diagnosis not present

## 2017-06-24 DIAGNOSIS — J3081 Allergic rhinitis due to animal (cat) (dog) hair and dander: Secondary | ICD-10-CM | POA: Diagnosis not present

## 2017-07-03 DIAGNOSIS — Z96642 Presence of left artificial hip joint: Secondary | ICD-10-CM | POA: Diagnosis not present

## 2017-07-03 DIAGNOSIS — J3081 Allergic rhinitis due to animal (cat) (dog) hair and dander: Secondary | ICD-10-CM | POA: Diagnosis not present

## 2017-07-03 DIAGNOSIS — J3089 Other allergic rhinitis: Secondary | ICD-10-CM | POA: Diagnosis not present

## 2017-07-03 DIAGNOSIS — Z471 Aftercare following joint replacement surgery: Secondary | ICD-10-CM | POA: Diagnosis not present

## 2017-07-03 DIAGNOSIS — M1612 Unilateral primary osteoarthritis, left hip: Secondary | ICD-10-CM | POA: Diagnosis not present

## 2017-07-03 DIAGNOSIS — J301 Allergic rhinitis due to pollen: Secondary | ICD-10-CM | POA: Diagnosis not present

## 2017-07-13 DIAGNOSIS — J301 Allergic rhinitis due to pollen: Secondary | ICD-10-CM | POA: Diagnosis not present

## 2017-07-13 DIAGNOSIS — J3081 Allergic rhinitis due to animal (cat) (dog) hair and dander: Secondary | ICD-10-CM | POA: Diagnosis not present

## 2017-07-13 DIAGNOSIS — J3089 Other allergic rhinitis: Secondary | ICD-10-CM | POA: Diagnosis not present

## 2017-07-23 DIAGNOSIS — J3089 Other allergic rhinitis: Secondary | ICD-10-CM | POA: Diagnosis not present

## 2017-07-23 DIAGNOSIS — J301 Allergic rhinitis due to pollen: Secondary | ICD-10-CM | POA: Diagnosis not present

## 2017-07-23 DIAGNOSIS — J3081 Allergic rhinitis due to animal (cat) (dog) hair and dander: Secondary | ICD-10-CM | POA: Diagnosis not present

## 2017-07-28 ENCOUNTER — Other Ambulatory Visit: Payer: Self-pay | Admitting: Family Medicine

## 2017-07-29 MED ORDER — ALPRAZOLAM 0.25 MG PO TABS: ORAL_TABLET | ORAL | 0 refills | Status: DC

## 2017-07-29 NOTE — Telephone Encounter (Signed)
rx called in

## 2017-07-31 DIAGNOSIS — J3089 Other allergic rhinitis: Secondary | ICD-10-CM | POA: Diagnosis not present

## 2017-07-31 DIAGNOSIS — J3081 Allergic rhinitis due to animal (cat) (dog) hair and dander: Secondary | ICD-10-CM | POA: Diagnosis not present

## 2017-07-31 DIAGNOSIS — J301 Allergic rhinitis due to pollen: Secondary | ICD-10-CM | POA: Diagnosis not present

## 2017-08-06 DIAGNOSIS — H40013 Open angle with borderline findings, low risk, bilateral: Secondary | ICD-10-CM | POA: Diagnosis not present

## 2017-08-06 DIAGNOSIS — H16222 Keratoconjunctivitis sicca, not specified as Sjogren's, left eye: Secondary | ICD-10-CM | POA: Diagnosis not present

## 2017-08-06 DIAGNOSIS — H2513 Age-related nuclear cataract, bilateral: Secondary | ICD-10-CM | POA: Diagnosis not present

## 2017-08-11 DIAGNOSIS — J3089 Other allergic rhinitis: Secondary | ICD-10-CM | POA: Diagnosis not present

## 2017-08-11 DIAGNOSIS — J3081 Allergic rhinitis due to animal (cat) (dog) hair and dander: Secondary | ICD-10-CM | POA: Diagnosis not present

## 2017-08-11 DIAGNOSIS — J301 Allergic rhinitis due to pollen: Secondary | ICD-10-CM | POA: Diagnosis not present

## 2017-08-12 ENCOUNTER — Other Ambulatory Visit: Payer: Self-pay | Admitting: Family Medicine

## 2017-08-12 DIAGNOSIS — Z1231 Encounter for screening mammogram for malignant neoplasm of breast: Secondary | ICD-10-CM

## 2017-08-13 ENCOUNTER — Encounter: Payer: Self-pay | Admitting: Family Medicine

## 2017-08-26 DIAGNOSIS — J301 Allergic rhinitis due to pollen: Secondary | ICD-10-CM | POA: Diagnosis not present

## 2017-08-26 DIAGNOSIS — J3081 Allergic rhinitis due to animal (cat) (dog) hair and dander: Secondary | ICD-10-CM | POA: Diagnosis not present

## 2017-08-26 DIAGNOSIS — J3089 Other allergic rhinitis: Secondary | ICD-10-CM | POA: Diagnosis not present

## 2017-08-31 ENCOUNTER — Ambulatory Visit: Payer: Medicare Other

## 2017-09-01 ENCOUNTER — Ambulatory Visit (INDEPENDENT_AMBULATORY_CARE_PROVIDER_SITE_OTHER): Payer: Medicare Other | Admitting: *Deleted

## 2017-09-01 ENCOUNTER — Other Ambulatory Visit: Payer: Self-pay | Admitting: Family Medicine

## 2017-09-01 DIAGNOSIS — Z23 Encounter for immunization: Secondary | ICD-10-CM

## 2017-09-02 ENCOUNTER — Ambulatory Visit
Admission: RE | Admit: 2017-09-02 | Discharge: 2017-09-02 | Disposition: A | Discharge status: Home / Self Care | Payer: Medicare Other | Source: Ambulatory Visit | Attending: Family Medicine | Admitting: Family Medicine

## 2017-09-02 DIAGNOSIS — Z1231 Encounter for screening mammogram for malignant neoplasm of breast: Secondary | ICD-10-CM

## 2017-09-03 MED ORDER — ALPRAZOLAM 0.25 MG PO TABS: ORAL_TABLET | ORAL | 0 refills | Status: DC

## 2017-09-03 NOTE — Telephone Encounter (Signed)
Rx done and pt notified via Mychart message. 

## 2017-09-09 DIAGNOSIS — J3089 Other allergic rhinitis: Secondary | ICD-10-CM | POA: Diagnosis not present

## 2017-09-09 DIAGNOSIS — J3081 Allergic rhinitis due to animal (cat) (dog) hair and dander: Secondary | ICD-10-CM | POA: Diagnosis not present

## 2017-09-09 DIAGNOSIS — J301 Allergic rhinitis due to pollen: Secondary | ICD-10-CM | POA: Diagnosis not present

## 2017-09-23 DIAGNOSIS — J3089 Other allergic rhinitis: Secondary | ICD-10-CM | POA: Diagnosis not present

## 2017-09-23 DIAGNOSIS — J301 Allergic rhinitis due to pollen: Secondary | ICD-10-CM | POA: Diagnosis not present

## 2017-09-23 DIAGNOSIS — J3081 Allergic rhinitis due to animal (cat) (dog) hair and dander: Secondary | ICD-10-CM | POA: Diagnosis not present

## 2017-09-25 ENCOUNTER — Other Ambulatory Visit: Payer: Self-pay | Admitting: Family Medicine

## 2017-09-27 ENCOUNTER — Other Ambulatory Visit: Payer: Self-pay | Admitting: Family Medicine

## 2017-09-28 NOTE — Telephone Encounter (Signed)
Rx sent for a 90 day supply with no refills. Pt needs to schedule for there next [place] last seen 11/2016

## 2017-09-29 ENCOUNTER — Encounter: Payer: Self-pay | Admitting: Family Medicine

## 2017-10-01 DIAGNOSIS — J3081 Allergic rhinitis due to animal (cat) (dog) hair and dander: Secondary | ICD-10-CM | POA: Diagnosis not present

## 2017-10-01 DIAGNOSIS — J3089 Other allergic rhinitis: Secondary | ICD-10-CM | POA: Diagnosis not present

## 2017-10-01 DIAGNOSIS — J301 Allergic rhinitis due to pollen: Secondary | ICD-10-CM | POA: Diagnosis not present

## 2017-10-07 ENCOUNTER — Encounter: Payer: Self-pay | Admitting: Family Medicine

## 2017-10-07 ENCOUNTER — Ambulatory Visit (INDEPENDENT_AMBULATORY_CARE_PROVIDER_SITE_OTHER): Payer: Medicare Other | Admitting: Family Medicine

## 2017-10-07 VITALS — BP 130/80 | HR 62 | Temp 98.3°F | Wt 165.8 lb

## 2017-10-07 DIAGNOSIS — M5412 Radiculopathy, cervical region: Secondary | ICD-10-CM

## 2017-10-07 NOTE — Patient Instructions (Signed)
Cervical Radiculopathy Cervical radiculopathy happens when a nerve in the neck (cervical nerve) is pinched or bruised. This condition can develop because of an injury or as part of the normal aging process. Pressure on the cervical nerves can cause pain or numbness that runs from the neck all the way down into the arm and fingers. Usually, this condition gets better with rest. Treatment may be needed if the condition does not improve. What are the causes? This condition may be caused by:  Injury.  Slipped (herniated) disk.  Muscle tightness in the neck because of overuse.  Arthritis.  Breakdown or degeneration in the bones and joints of the spine (spondylosis) due to aging.  Bone spurs that may develop near the cervical nerves.  What are the signs or symptoms? Symptoms of this condition include:  Pain that runs from the neck to the arm and hand. The pain can be severe or irritating. It may be worse when the neck is moved.  Numbness or weakness in the affected arm and hand.  How is this diagnosed? This condition may be diagnosed based on symptoms, medical history, and a physical exam. You may also have tests, including:  X-rays.  CT scan.  MRI.  Electromyogram (EMG).  Nerve conduction tests.  How is this treated? In many cases, treatment is not needed for this condition. With rest, the condition usually gets better over time. If treatment is needed, options may include:  Wearing a soft neck collar for short periods of time.  Physical therapy to strengthen your neck muscles.  Medicines, such as NSAIDs, oral corticosteroids, or spinal injections.  Surgery. This may be needed if other treatments do not help. Various types of surgery may be done depending on the cause of your problems.  Follow these instructions at home: Managing pain  Take over-the-counter and prescription medicines only as told by your health care provider.  If directed, apply ice to the affected  area. ? Put ice in a plastic bag. ? Place a towel between your skin and the bag. ? Leave the ice on for 20 minutes, 2-3 times per day.  If ice does not help, you can try using heat. Take a warm shower or warm bath, or use a heat pack as told by your health care provider.  Try a gentle neck and shoulder massage to help relieve symptoms. Activity  Rest as needed. Follow instructions from your health care provider about any restrictions on activities.  Do stretching and strengthening exercises as told by your health care provider or physical therapist. General instructions  If you were given a soft collar, wear it as told by your health care provider.  Use a flat pillow when you sleep.  Keep all follow-up visits as told by your health care provider. This is important. Contact a health care provider if:  Your condition does not improve with treatment. Get help right away if:  Your pain gets much worse and cannot be controlled with medicines.  You have weakness or numbness in your hand, arm, face, or leg.  You have a high fever.  You have a stiff, rigid neck.  You lose control of your bowels or your bladder (have incontinence).  You have trouble with walking, balance, or speaking. This information is not intended to replace advice given to you by your health care provider. Make sure you discuss any questions you have with your health care provider. Document Released: 08/05/2001 Document Revised: 04/17/2016 Document Reviewed: 01/04/2015 Elsevier Interactive Patient Education    2018 [employer]  

## 2017-10-07 NOTE — Progress Notes (Signed)
Subjective:     Patient ID: Theresa Bradley, female   DOB: [birth year], 66 y.o.   MRN: [id number]  HPI Patient seen with over 2 month history of pain radiating from the left neck near C6-C7 down all the way occasional into the hand. Her pain was initially 4-5 out of 10 the past few days been slightly better. Denies any injury. No numbness. Question weakness intermittently. He's had prior history of shoulder pain which was injected with steroids fears ago and improved. This pain is somewhat different. Worse with stretching out arm and somewhat improved with flexing at the elbow. No swelling. No prior history of cervical radiculitis. No chest pain. She takes Modic most days  Past Medical History:  Diagnosis Date  . COUGH, CHRONIC 05/10/2010  . HYPERTENSION 04/16/2009  . HYPOTHYROIDISM 04/16/2009  . OSTEOARTHRITIS 04/16/2009  . SENILE LENTIGO 06/10/2010   Past Surgical History:  Procedure Laterality Date  . BREAST EXCISIONAL BIOPSY Right   . BREAST EXCISIONAL BIOPSY Left   . BREAST LUMPECTOMY Right    radiation only  . BREAST SURGERY  2009   X 2, cancer stage 0, lumpectomy  . [place]  . CHOLECYSTECTOMY    . DILATATION & CURETTAGE/HYSTEROSCOPY WITH MYOSURE  11/2015  . HERNIA REPAIR  2009  . JOINT REPLACEMENT  2008   hip    reports that  has never smoked. she has never used smokeless tobacco. She reports that she does not use drugs. Her alcohol history is not on file. family history includes Arthritis in her other; Cancer in her other; Hyperlipidemia in her father; Hypertension in her other. Allergies  Allergen Reactions  . Codeine Sulfate Nausea And Vomiting  . Erythromycin Base Other (See Comments)    Stomach ache  . Esomeprazole Magnesium     REACTION: GI upset     Review of Systems  Respiratory: Negative for shortness of breath.   Cardiovascular: Negative for chest pain.  Neurological: Negative for weakness and numbness.       Objective:   Physical Exam   Constitutional: She appears well-developed and well-nourished.  Cardiovascular: Normal rate and regular rhythm.  Pulmonary/Chest: Effort normal and breath sounds normal. No respiratory distress. She has no wheezes. She has no rales.  Musculoskeletal:  Full range of motion left shoulder. No pain with abduction against resistance or internal rotation.  Neurological:  Symmetric reflexes upper extremities. No focal weakness. No sensory impairment.       Assessment:     Probable left cervical radiculitis with symptoms over 2 months duration    Plan:     -Continue Mobic -Start with some plain x-rays of the cervical spine -May need MRI scan to further assess if symptoms persist or worsen  [name] MD [facility]

## 2017-10-08 DIAGNOSIS — J3089 Other allergic rhinitis: Secondary | ICD-10-CM | POA: Diagnosis not present

## 2017-10-12 DIAGNOSIS — J3089 Other allergic rhinitis: Secondary | ICD-10-CM | POA: Diagnosis not present

## 2017-10-12 DIAGNOSIS — J301 Allergic rhinitis due to pollen: Secondary | ICD-10-CM | POA: Diagnosis not present

## 2017-10-12 DIAGNOSIS — J3081 Allergic rhinitis due to animal (cat) (dog) hair and dander: Secondary | ICD-10-CM | POA: Diagnosis not present

## 2017-10-13 ENCOUNTER — Encounter (HOSPITAL_COMMUNITY): Payer: Self-pay | Admitting: Emergency Medicine

## 2017-10-13 ENCOUNTER — Emergency Department (HOSPITAL_COMMUNITY)
Admission: EM | Admit: 2017-10-13 | Discharge: 2017-10-14 | Disposition: A | Discharge status: Home / Self Care | Payer: Medicare Other | Attending: Emergency Medicine | Admitting: Emergency Medicine

## 2017-10-13 ENCOUNTER — Other Ambulatory Visit: Payer: Self-pay

## 2017-10-13 ENCOUNTER — Emergency Department (HOSPITAL_COMMUNITY): Payer: Medicare Other

## 2017-10-13 ENCOUNTER — Encounter: Payer: Self-pay | Admitting: Family Medicine

## 2017-10-13 DIAGNOSIS — Z96642 Presence of left artificial hip joint: Secondary | ICD-10-CM | POA: Insufficient documentation

## 2017-10-13 DIAGNOSIS — X509XXA Other and unspecified overexertion or strenuous movements or postures, initial encounter: Secondary | ICD-10-CM | POA: Insufficient documentation

## 2017-10-13 DIAGNOSIS — E039 Hypothyroidism, unspecified: Secondary | ICD-10-CM | POA: Insufficient documentation

## 2017-10-13 DIAGNOSIS — M25552 Pain in left hip: Secondary | ICD-10-CM | POA: Diagnosis not present

## 2017-10-13 DIAGNOSIS — Y929 Unspecified place or not applicable: Secondary | ICD-10-CM | POA: Insufficient documentation

## 2017-10-13 DIAGNOSIS — IMO0001 Reserved for inherently not codable concepts without codable children: Secondary | ICD-10-CM

## 2017-10-13 DIAGNOSIS — I10 Essential (primary) hypertension: Secondary | ICD-10-CM | POA: Diagnosis not present

## 2017-10-13 DIAGNOSIS — S73005A Unspecified dislocation of left hip, initial encounter: Secondary | ICD-10-CM | POA: Insufficient documentation

## 2017-10-13 DIAGNOSIS — Z79899 Other long term (current) drug therapy: Secondary | ICD-10-CM | POA: Insufficient documentation

## 2017-10-13 DIAGNOSIS — S79912A Unspecified injury of left hip, initial encounter: Secondary | ICD-10-CM | POA: Diagnosis present

## 2017-10-13 DIAGNOSIS — Y9389 Activity, other specified: Secondary | ICD-10-CM | POA: Insufficient documentation

## 2017-10-13 DIAGNOSIS — R52 Pain, unspecified: Secondary | ICD-10-CM | POA: Diagnosis not present

## 2017-10-13 DIAGNOSIS — Z7982 Long term (current) use of aspirin: Secondary | ICD-10-CM | POA: Insufficient documentation

## 2017-10-13 DIAGNOSIS — T84021A Dislocation of internal left hip prosthesis, initial encounter: Secondary | ICD-10-CM | POA: Diagnosis not present

## 2017-10-13 DIAGNOSIS — Z471 Aftercare following joint replacement surgery: Secondary | ICD-10-CM | POA: Diagnosis not present

## 2017-10-13 DIAGNOSIS — Y998 Other external cause status: Secondary | ICD-10-CM | POA: Insufficient documentation

## 2017-10-13 MED ORDER — FENTANYL CITRATE (PF) 100 MCG/2ML IJ SOLN
50.0000 ug | Freq: Once | INTRAMUSCULAR | Status: AC
  Administered 2017-10-13: 50 ug via INTRAVENOUS
  Filled 2017-10-13: qty 2

## 2017-10-13 MED ORDER — PROPOFOL 10 MG/ML IV BOLUS
INTRAVENOUS | Status: AC
  Administered 2017-10-13: 40 mg
  Filled 2017-10-13: qty 20

## 2017-10-13 NOTE — ED Provider Notes (Signed)
[facility] DEPT Provider Note   CSN: [id number] Arrival date & time: 10/13/17  2051     History   Chief Complaint Chief Complaint  Patient presents with  . Hip Injury    HPI Theresa Bradley is a 66 y.o. female.  HPI Theresa Bradley is a 66 y.o. female presents to emergency department with complaint of left hip injury.  Patient states she leaned over early this evening and felt her left hip pop out of place.  She reports immediate pain in that hip.  Denies numbness or weakness distal to the injury.  She reports history of hip dislocation 3 years ago on the same hip, and hip replacement 10 years ago by Dr. Maureen Ralphs.  Patient received 250 mcg of fentanyl by EMS, states her pain is now better but states she is still very uncomfortable.  No other injuries.  No falls.  Past Medical History:  Diagnosis Date  . COUGH, CHRONIC 05/10/2010  . HYPERTENSION 04/16/2009  . HYPOTHYROIDISM 04/16/2009  . OSTEOARTHRITIS 04/16/2009  . SENILE LENTIGO 06/10/2010    Patient Active Problem List   Diagnosis Date Noted  . Cough 05/31/2014  . GERD (gastroesophageal reflux disease) 09/13/2012  . HYPOTHYROIDISM 04/16/2009  . HYPERTENSION 04/16/2009  . OSTEOARTHRITIS 04/16/2009    Past Surgical History:  Procedure Laterality Date  . BREAST EXCISIONAL BIOPSY Right   . BREAST EXCISIONAL BIOPSY Left   . BREAST LUMPECTOMY Right    radiation only  . BREAST SURGERY  2009   X 2, cancer stage 0, lumpectomy  . [place]  . CHOLECYSTECTOMY    . DILATATION & CURETTAGE/HYSTEROSCOPY WITH MYOSURE  11/2015  . HERNIA REPAIR  2009  . JOINT REPLACEMENT  2008   hip    OB History    No data available       Home Medications    Prior to Admission medications   Medication Sig Start Date End Date Taking? Authorizing Provider  ALPRAZolam Duanne Moron) 0.25 MG tablet Take a half tab at bedtime as needed for sleep 09/03/17   [name], MD  amLODipine  (NORVASC) 5 MG tablet TAKE 1 TABLET (5 MG TOTAL) BY MOUTH DAILY. 09/28/17   [name], [name], MD  aspirin 81 MG tablet Take 81 mg by mouth daily.      [provider]  Azelastine HCl 0.15 % SOLN Place 1 spray into both nostrils daily as needed. 05/29/15   [provider]  Calcium Carbonate-Vit D-Min (CALCIUM 1200 PO) Take by mouth.    [provider]  Cholecalciferol (VITAMIN D PO) Take by mouth.    [provider]  citalopram (CELEXA) 20 MG tablet TAKE 1 TABLET (20 MG TOTAL) BY MOUTH DAILY. 09/25/17   [name], [name], MD  EPIPEN 2-PAK 0.3 MG/0.3ML SOAJ injection  03/08/14   [provider]  famotidine (PEPCID) 10 MG tablet Take 10 mg 2 (two) times daily by mouth.    [provider]  levothyroxine (SYNTHROID, LEVOTHROID) 50 MCG tablet Take 1 tablet (50 mcg total) by mouth daily. 12/02/16   [name], [name], MD  meloxicam (MOBIC) 15 MG tablet TAKE 1 TABLET BY MOUTH ONCE DAILY 08/12/17   [name], [name], MD  tretinoin (RETIN-A) 0.025 % cream Apply topically at bedtime. 11/06/15   [name], [name], MD    Family History Family History  Problem Relation Age of Onset  . Arthritis Other   . Hypertension Other   .  Cancer Other        2 aunts  . Hyperlipidemia Father     Social History Social History   Tobacco Use  . Smoking status: Never Smoker  . Smokeless tobacco: Never Used  Substance Use Topics  . Alcohol use: Not on file  . Drug use: No     Allergies   Codeine sulfate; Erythromycin base; and Esomeprazole magnesium   Review of Systems Review of Systems  Constitutional: Negative for chills and fever.  Respiratory: Negative for cough, chest tightness and shortness of breath.   Cardiovascular: Negative for chest pain, palpitations and leg swelling.  Genitourinary: Negative for dysuria, flank pain and pelvic pain.  Musculoskeletal: Positive for arthralgias, joint swelling and myalgias. Negative for neck pain and neck  stiffness.  Skin: Negative for rash.  Neurological: Negative for weakness and numbness.  All other systems reviewed and are negative.    Physical Exam Updated Vital Signs BP (!) 150/70   Pulse 64   Temp 98 F (36.7 C) (Oral)   Resp 18   Ht 5' 4.5" (1.638 m)   Wt 74.8 kg (165 lb)   SpO2 96%   BMI 27.88 kg/m   Physical Exam  Constitutional: She appears well-developed and well-nourished. No distress.  HENT:  Head: Normocephalic.  Eyes: Conjunctivae are normal.  Neck: Neck supple.  Cardiovascular: Normal rate, regular rhythm and normal heart sounds.  Pulmonary/Chest: Effort normal and breath sounds normal. No respiratory distress. She has no wheezes. She has no rales.  Musculoskeletal: She exhibits no edema.  Obvious deformity to left hip.  Left leg is internally rotated and shortened.  Dorsal pedal pulse intact in the left foot, toes are pink, capillary refill less than 2 seconds distally.  Neurological: She is alert.  Skin: Skin is warm and dry.  Psychiatric: She has a normal mood and affect. Her behavior is normal.  Nursing note and vitals reviewed.    ED Treatments / Results  Labs (all labs ordered are listed, but only abnormal results are displayed) Labs Reviewed - No data to display  EKG  EKG Interpretation None       Radiology Dg Hip Unilat With Pelvis 2-3 Views Left  Result Date: 10/13/2017 CLINICAL DATA:  Recent fall with hip dislocation EXAM: DG HIP (WITH OR WITHOUT PELVIS) 2-3V LEFT COMPARISON:  08/18/2015 FINDINGS: There is superior dislocation of the left femoral prosthesis with respect to the acetabular component. The overall appearance is similar to that seen on the prior exam. No acute fracture is noted. The pelvic ring is intact. IMPRESSION: Dislocation of left femoral prosthesis from the acetabular component. Electronically Signed   By: Inez Catalina M.D.   On: 10/13/2017 21:46    Procedures Reduction of dislocation Date/Time: 10/14/2017 12:49  AM Performed by: Jeannett Senior, PA-C Authorized by: Jeannett Senior, PA-C  Consent: Verbal consent obtained. Written consent obtained. Consent given by: patient Patient understanding: patient states understanding of the procedure being performed Patient consent: the patient's understanding of the procedure matches consent given Procedure consent: procedure consent matches procedure scheduled Test results: test results available and properly labeled Imaging studies: imaging studies available Required items: required blood products, implants, devices, and special equipment available Patient identity confirmed: verbally with patient and arm band Time out: Immediately prior to procedure a "time out" was called to verify the correct patient, procedure, equipment, support staff and site/side marked as required. Local anesthesia used: no  Anesthesia: Local anesthesia used: no Patient tolerance: Patient tolerated the procedure well  with no immediate complications Comments: Traction, countertraction technique used for hip reduction with successful reduction.     (including critical care time)  Medications Ordered in ED Medications - No data to display   Initial Impression / Assessment and Plan / ED Course  I have reviewed the triage vital signs and the nursing notes.  Pertinent labs & imaging results that were available during my care of the patient were reviewed by me and considered in my medical decision making (see chart for details).     Patient in the emergency department with left hip prosthesis dislocation.  She is neurovascularly intact.  Discussed with Dr. Ayesha Rumpf, will perform procedural sedation and try to reduce hip.  11:56 PM Closed reduction with procedural sedation performed, with successful reduction of the hip. Pt feels better. Placed in knee immobilizerd.  Pt was able to get up and ambulate with a walker. Has a walker at home. Home with immobilizer and follow up  with orthopedics.   Vitals:   10/13/17 2315 10/13/17 2330 10/13/17 2345 10/14/17 0026  BP: 126/84 136/66 135/69 132/70  Pulse: (!) 57 (!) 55 64 62  Resp: 16 13 14 18   Temp:      TempSrc:      SpO2: 100% 100% 100% 100%  Weight:      Height:         Final Clinical Impressions(s) / ED Diagnoses   Final diagnoses:  Dislocation of left hip, initial encounter [facility]    ED Discharge Orders        Ordered    HYDROcodone-acetaminophen (NORCO) 5-325 MG tablet  Every 6 hours PRN     10/14/17 0046       Jeannett Senior, PA-C 10/14/17 0051    Quintella Reichert, MD 10/14/17 1456

## 2017-10-13 NOTE — ED Triage Notes (Addendum)
Pt brought in by EMS from home with c/o left hip injury.  Pt reported that she was in her recliner chair and was adjusting chair when her "hip popped out of place", sustaining immediate severe pain.  Pt has had hx of left hip dislocation 3 years ago.  Pt was given Fentanyl 250 mcg IV on scene.

## 2017-10-13 NOTE — ED Notes (Signed)
Additional Propofol 20 mg IV administered by EDP at this time.

## 2017-10-13 NOTE — ED Notes (Signed)
Bed: WA09 Expected date:  Expected time:  Means of arrival:  Comments: 71 yr hip dislocated

## 2017-10-14 MED ORDER — HYDROCODONE-ACETAMINOPHEN 5-325 MG PO TABS: 1.0000 | ORAL_TABLET | Freq: Four times a day (QID) | ORAL | 0 refills | Status: DC | PRN

## 2017-10-14 NOTE — ED Notes (Signed)
Pt stated that she can never be able to use crutches "because of bad coordination".  Pt ambulated well with walker in the hall and to the bathroom.  Pt reported that she is able to bear weight on left leg without increased pain.

## 2017-10-14 NOTE — Discharge Instructions (Signed)
Keep immobilizer on at all times. Follow up with Dr. Wynelle Link. Pain medications as prescribed as needed. Return if any problems.

## 2017-10-22 DIAGNOSIS — J3081 Allergic rhinitis due to animal (cat) (dog) hair and dander: Secondary | ICD-10-CM | POA: Diagnosis not present

## 2017-10-22 DIAGNOSIS — J301 Allergic rhinitis due to pollen: Secondary | ICD-10-CM | POA: Diagnosis not present

## 2017-10-27 DIAGNOSIS — S73005A Unspecified dislocation of left hip, initial encounter: Secondary | ICD-10-CM | POA: Diagnosis not present

## 2017-10-27 DIAGNOSIS — M1612 Unilateral primary osteoarthritis, left hip: Secondary | ICD-10-CM | POA: Diagnosis not present

## 2017-10-30 DIAGNOSIS — J301 Allergic rhinitis due to pollen: Secondary | ICD-10-CM | POA: Diagnosis not present

## 2017-10-30 DIAGNOSIS — J3089 Other allergic rhinitis: Secondary | ICD-10-CM | POA: Diagnosis not present

## 2017-11-06 ENCOUNTER — Other Ambulatory Visit: Payer: Self-pay | Admitting: Family Medicine

## 2017-11-06 DIAGNOSIS — S73005D Unspecified dislocation of left hip, subsequent encounter: Secondary | ICD-10-CM | POA: Diagnosis not present

## 2017-11-09 DIAGNOSIS — J3089 Other allergic rhinitis: Secondary | ICD-10-CM | POA: Diagnosis not present

## 2017-11-09 DIAGNOSIS — J3081 Allergic rhinitis due to animal (cat) (dog) hair and dander: Secondary | ICD-10-CM | POA: Diagnosis not present

## 2017-11-09 DIAGNOSIS — J301 Allergic rhinitis due to pollen: Secondary | ICD-10-CM | POA: Diagnosis not present

## 2017-11-10 ENCOUNTER — Other Ambulatory Visit: Payer: Self-pay | Admitting: Family Medicine

## 2017-11-10 DIAGNOSIS — S73005D Unspecified dislocation of left hip, subsequent encounter: Secondary | ICD-10-CM | POA: Diagnosis not present

## 2017-11-10 NOTE — Telephone Encounter (Signed)
Refill once OK. 

## 2017-11-13 DIAGNOSIS — J3081 Allergic rhinitis due to animal (cat) (dog) hair and dander: Secondary | ICD-10-CM | POA: Diagnosis not present

## 2017-11-13 DIAGNOSIS — J3089 Other allergic rhinitis: Secondary | ICD-10-CM | POA: Diagnosis not present

## 2017-11-13 DIAGNOSIS — J301 Allergic rhinitis due to pollen: Secondary | ICD-10-CM | POA: Diagnosis not present

## 2017-11-13 DIAGNOSIS — S73005D Unspecified dislocation of left hip, subsequent encounter: Secondary | ICD-10-CM | POA: Diagnosis not present

## 2017-11-19 DIAGNOSIS — S73005D Unspecified dislocation of left hip, subsequent encounter: Secondary | ICD-10-CM | POA: Diagnosis not present

## 2017-11-20 ENCOUNTER — Other Ambulatory Visit: Payer: Self-pay | Admitting: Family Medicine

## 2017-11-20 NOTE — Telephone Encounter (Signed)
Last refill given on 10/11 for #60 with no ref

## 2017-11-21 NOTE — Telephone Encounter (Signed)
Refill once 

## 2017-11-23 DIAGNOSIS — J301 Allergic rhinitis due to pollen: Secondary | ICD-10-CM | POA: Diagnosis not present

## 2017-11-23 DIAGNOSIS — J3089 Other allergic rhinitis: Secondary | ICD-10-CM | POA: Diagnosis not present

## 2017-11-23 DIAGNOSIS — J3081 Allergic rhinitis due to animal (cat) (dog) hair and dander: Secondary | ICD-10-CM | POA: Diagnosis not present

## 2017-11-23 DIAGNOSIS — S73005D Unspecified dislocation of left hip, subsequent encounter: Secondary | ICD-10-CM | POA: Diagnosis not present

## 2017-11-23 NOTE — Telephone Encounter (Signed)
Rx done. 

## 2017-11-26 DIAGNOSIS — M25559 Pain in unspecified hip: Secondary | ICD-10-CM | POA: Diagnosis not present

## 2017-11-27 ENCOUNTER — Ambulatory Visit: Payer: Medicare Other

## 2017-11-27 DIAGNOSIS — J3081 Allergic rhinitis due to animal (cat) (dog) hair and dander: Secondary | ICD-10-CM | POA: Diagnosis not present

## 2017-11-27 DIAGNOSIS — J3089 Other allergic rhinitis: Secondary | ICD-10-CM | POA: Diagnosis not present

## 2017-11-27 DIAGNOSIS — J301 Allergic rhinitis due to pollen: Secondary | ICD-10-CM | POA: Diagnosis not present

## 2017-12-08 DIAGNOSIS — J301 Allergic rhinitis due to pollen: Secondary | ICD-10-CM | POA: Diagnosis not present

## 2017-12-08 DIAGNOSIS — M25559 Pain in unspecified hip: Secondary | ICD-10-CM | POA: Diagnosis not present

## 2017-12-08 DIAGNOSIS — J3081 Allergic rhinitis due to animal (cat) (dog) hair and dander: Secondary | ICD-10-CM | POA: Diagnosis not present

## 2017-12-08 DIAGNOSIS — J3089 Other allergic rhinitis: Secondary | ICD-10-CM | POA: Diagnosis not present

## 2017-12-18 DIAGNOSIS — J301 Allergic rhinitis due to pollen: Secondary | ICD-10-CM | POA: Diagnosis not present

## 2017-12-18 DIAGNOSIS — J3089 Other allergic rhinitis: Secondary | ICD-10-CM | POA: Diagnosis not present

## 2017-12-18 DIAGNOSIS — J3081 Allergic rhinitis due to animal (cat) (dog) hair and dander: Secondary | ICD-10-CM | POA: Diagnosis not present

## 2017-12-22 ENCOUNTER — Other Ambulatory Visit: Payer: Self-pay | Admitting: Family Medicine

## 2017-12-22 DIAGNOSIS — J3089 Other allergic rhinitis: Secondary | ICD-10-CM | POA: Diagnosis not present

## 2017-12-22 DIAGNOSIS — J3081 Allergic rhinitis due to animal (cat) (dog) hair and dander: Secondary | ICD-10-CM | POA: Diagnosis not present

## 2017-12-22 DIAGNOSIS — J301 Allergic rhinitis due to pollen: Secondary | ICD-10-CM | POA: Diagnosis not present

## 2017-12-28 NOTE — Progress Notes (Addendum)
Subjective:   Theresa Bradley is a 67 y.o. female who presents for an Initial Medicare Annual Wellness Visit.  Report of health Breast surgery; 2009 Chronic cough;   Diet Was much heavier years ago Eating less; cutting out sweets Doesn't eat a lot   BMI 28    Exercise Left hip exercises;  Finished PT stopped x 1 week ago  Visiting gyms now  [employer]   Has a fitbit   Father has Alz Was given an Alz packet with resources Lives in the [facility] Maintenance Due  Topic Date Due  . Hepatitis C Screening  Apr 19, 1951   Educated regarding Hep c and can discuss this with Dr. Elease Hashimoto at her next visit  Colonoscopy 03/2009 due in 10 years Mammogram 08/2017; due 08/2018 Dexa; 11/2015  -1.7  Educated regarding the osteoporosis foundation  Had one does of shingrix 05/2017 Had a bad reaction; fever 101 x 3 days and was achy Took the second one in Dec and not as bad a reaction   Cardiac Risk Factors include: advanced age (>60men, >61 women);hypertension    Objective:    Today's Vitals   12/29/17 1003  BP: 110/70  Pulse: (!) 58  SpO2: 97%  Weight: 163 lb (73.9 kg)  Height: 5\' 4"  (1.626 m)   Body mass index is 27.98 kg/m.  Advanced Directives 12/29/2017 10/13/2017  Does Patient Have a Medical Advance Directive? No No   Educated regarding Financial controller; Thinks she may have completed this but is not sure. Will check and given [facility] form to review  Current Medications (verified) Outpatient Encounter Medications as of 12/29/2017  Medication Sig  . ALPRAZolam (XANAX) 0.25 MG tablet TAKE 1/2 TABLET AT BEDTIME AS NEEDED FOR SLEEP  . amLODipine (NORVASC) 5 MG tablet TAKE 1 TABLET (5 MG TOTAL) BY MOUTH DAILY.  Marland Kitchen aspirin 81 MG tablet Take 81 mg by mouth daily.    . Calcium Carbonate-Vit D-Min (CALCIUM 1200 PO) Take by mouth.  . Cholecalciferol (VITAMIN D PO) Take by mouth.  . citalopram (CELEXA) 20 MG tablet TAKE 1 TABLET (20 MG TOTAL) BY MOUTH  DAILY.  Marland Kitchen EPIPEN 2-PAK 0.3 MG/0.3ML SOAJ injection   . famotidine (PEPCID) 10 MG tablet Take 10 mg 2 (two) times daily by mouth.  . levothyroxine (SYNTHROID, LEVOTHROID) 50 MCG tablet TAKE 1 TABLET (50 MCG TOTAL) BY MOUTH DAILY.  . meloxicam (MOBIC) 15 MG tablet TAKE 1 TABLET BY MOUTH ONCE DAILY  . Sennosides-Docusate Sodium (STOOL SOFTENER LAXATIVE PO) Take by mouth.  . tretinoin (RETIN-A) 0.025 % cream Apply topically at bedtime.  . Azelastine HCl 0.15 % SOLN Place 1 spray into both nostrils daily as needed.  Marland Kitchen HYDROcodone-acetaminophen (NORCO) 5-325 MG tablet Take 1 tablet by mouth every 6 (six) hours as needed for moderate pain. (Patient not taking: Reported on 12/29/2017)   No facility-administered encounter medications on file as of 12/29/2017.     Allergies (verified) Codeine sulfate; Erythromycin base; and Esomeprazole magnesium   History: Past Medical History:  Diagnosis Date  . COUGH, CHRONIC 05/10/2010  . HYPERTENSION 04/16/2009  . HYPOTHYROIDISM 04/16/2009  . OSTEOARTHRITIS 04/16/2009  . SENILE LENTIGO 06/10/2010   Past Surgical History:  Procedure Laterality Date  . BREAST EXCISIONAL BIOPSY Right   . BREAST EXCISIONAL BIOPSY Left   . BREAST LUMPECTOMY Right    radiation only  . BREAST SURGERY  2009   X 2, cancer stage 0, lumpectomy  . CESAREAN SECTION  1979, 1982  . CHOLECYSTECTOMY    . DILATATION & CURETTAGE/HYSTEROSCOPY WITH MYOSURE  11/2015  . HERNIA REPAIR  2009  . JOINT REPLACEMENT  2008   hip   Family History  Problem Relation Age of Onset  . Arthritis Other   . Hypertension Other   . Cancer Other        2 aunts  . Hyperlipidemia Father    Social History   Socioeconomic History  . Marital status: Married    Spouse name: Not on file  . Number of children: Not on file  . Years of education: Not on file  . Highest education level: Not on file  Social Needs  . Financial resource strain: Not on file  . Food insecurity - worry: Not on file  . Food  insecurity - inability: Not on file  . Transportation needs - medical: Not on file  . Transportation needs - non-medical: Not on file  Occupational History  . Not on file  Tobacco Use  . Smoking status: Never Smoker  . Smokeless tobacco: Never Used  . Tobacco comment: father smoked when she was younger   Substance and Sexual Activity  . Alcohol use: Not on file    Comment: occasionally   . Drug use: No  . Sexual activity: Not on file  Other Topics Concern  . Not on file  Social History Narrative  . Not on file    Tobacco Counseling Counseling given: Yes Comment: father smoked when she was younger    Clinical Intake:     Activities of Daily Living In your present state of health, do you have any difficulty performing the following activities: 12/29/2017  Hearing? N  Vision? N  Difficulty concentrating or making decisions? N  Comment father has alz  Walking or climbing stairs? N  Dressing or bathing? N  Doing errands, shopping? N  Preparing Food and eating ? N  Using the Toilet? N  Managing your Medications? N  Managing your Finances? N  Housekeeping or managing your Housekeeping? N  Some recent data might be hidden     Immunizations and Health Maintenance Immunization History  Administered Date(s) Administered  . Influenza Split 09/08/2011, 08/20/2012, 09/26/2013  . Influenza Whole 09/11/2010  . Influenza, High Dose Seasonal PF 10/03/2015, 08/14/2016, 09/01/2017  . Influenza,inj,Quad PF,6+ Mos 09/01/2014  . Pneumococcal Conjugate-13 09/29/2014  . Pneumococcal Polysaccharide-23 11/24/2006, 11/06/2015  . Td 11/25/1999, 06/10/2010  . Zoster 07/09/2010  . Zoster Recombinat (Shingrix) 06/05/2017, 10/24/2017   Health Maintenance Due  Topic Date Due  . Hepatitis C Screening  20-Dec-1950    Patient Care Team: [name], MD as PCP - General [name], [name], MD as Consulting Physician (Allergy and Immunology)   Dermatology apt for a full body check  last year/ not sure of name;   Dr. Ricki Rodriguez for ortho  [place] allergy and asthma (Dr. Fredderick Phenix )   Indicate any recent Medical Services you may have received from other than [facility] providers in the past year (date may be approximate).     Assessment:   This is a routine wellness examination for Theresa Bradley.  Hearing/Vision screen Hearing Screening Comments: No issues noted Vision Screening Comments: States she gets regular vision care  Dietary issues and exercise activities discussed:   Current Exercise Habits: Structured exercise class, Type of exercise: strength training/weights(in process of developing a program), Exercise limited by: orthopedic condition(s)  Goals    . Weight (lb) < 150 lb (68 kg)  Portion control and eating less sweets Focus on good nutrition  Leans and green  Watch sodium         Depression Screen PHQ 2/9 Scores 12/02/2016 11/06/2015 09/29/2014  PHQ - 2 Score 0 0 0    No verbalization of depression  Fall Risk Fall Risk  12/29/2017 12/02/2016 11/06/2015 09/29/2014  Falls in the past year? 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 Hepatitis C Screening  February 20, 1951  . COLONOSCOPY  04/23/2019  . MAMMOGRAM  09/03/2019  . TETANUS/TDAP  06/10/2020  . INFLUENZA VACCINE   Completed  . DEXA SCAN  Completed  . PNA vac Low Risk Adult  Completed        Plan:      PCP Notes   Health Maintenance Hepatitis C discussed and will have at her next blood draw. May discuss with Dr. Elease Hashimoto her next apt  Colonoscopy 03/2009 due in 10 years Mammogram 08/2017; due 08/2018 Dexa; 11/2015  -1.7  Educated regarding the osteoporosis foundation  Had one does of shingrix 05/2017 "Had a bad reaction"; fever 101 x 3 days and was achy Took the second one in Dec and did fine  Abnormal Screens  No  Referrals  no  Patient concerns; Dr. Wynelle Link THR 2009; had dislocation 67 yo and again in November of 18 after changing positions in a chair  Was told this may  be due to weak hip muscles and has followed up with PT as directed  Scale of 1 to 10 having minor pain now, mostly when sitting Has bursitis as well  Checking on gyms for continued exercise  Also states she did not have an opportunity to fup regarding her right shoulder but is feeling better now   Nurse Concerns; As noted  Next PCP apt To be scheduled; was seen Jan 2018 for labs and was told she still needs labs and fup.  Will schedule an apt       I have personally reviewed and noted the following in the patient's chart:   . Medical and social history . Use of alcohol, tobacco or illicit drugs  . Current medications and supplements . Functional ability and status . Nutritional status . Physical activity . Advanced directives . List of other physicians . Hospitalizations, surgeries, and ER visits in previous 12 months . Vitals . Screenings to include cognitive, depression, and falls . Referrals and appointments  In addition, I have reviewed and discussed with patient certain preventive protocols, quality metrics, and best practice recommendations. A written personalized care plan for preventive services as well as general preventive health recommendations were provided to patient.      LOVFI,EPPIR, RN   12/29/2017   Agree with assessment  as above.  [name] MD  Primary [facility] at [facility]

## 2017-12-29 ENCOUNTER — Ambulatory Visit (INDEPENDENT_AMBULATORY_CARE_PROVIDER_SITE_OTHER): Payer: Medicare Other

## 2017-12-29 VITALS — BP 110/70 | HR 58 | Ht 64.0 in | Wt 163.0 lb

## 2017-12-29 DIAGNOSIS — J3081 Allergic rhinitis due to animal (cat) (dog) hair and dander: Secondary | ICD-10-CM | POA: Diagnosis not present

## 2017-12-29 DIAGNOSIS — J301 Allergic rhinitis due to pollen: Secondary | ICD-10-CM | POA: Diagnosis not present

## 2017-12-29 DIAGNOSIS — Z0000 Encounter for general adult medical examination without abnormal findings: Secondary | ICD-10-CM

## 2017-12-29 DIAGNOSIS — Z1159 Encounter for screening for other viral diseases: Secondary | ICD-10-CM | POA: Diagnosis not present

## 2017-12-29 DIAGNOSIS — J3089 Other allergic rhinitis: Secondary | ICD-10-CM | POA: Diagnosis not present

## 2017-12-29 NOTE — Patient Instructions (Addendum)
Theresa Bradley , Thank you for taking time to come for your Medicare Wellness Visit. I appreciate your ongoing commitment to your health goals. Please review the following plan we discussed and let me know if I can assist you in the future  Will make apt with Dr. Elease Hashimoto to have labs and discuss shoulder  Hip happened and never had xrays   (father has Alz and given resources)   Recommendations for Dexa Scan Female over the age of 7 Man age 69 or older If you broke a bone past the age of 21 Women menopausal age with risk factors (thin frame; smoker; hx of fx ) Post menopausal women under the age of 39 with risk factors A man age 72 to 79 with risk factors Other: Spine xray that is showing break of bone loss Back pain with possible break Height loss of 1/2 inch or more within one year Total loss in height of 1.5 inches from your original height  Calcium 1258m with Vit D 800u per day; more as directed by physician Strength building exercises discussed; can include walking; housework; small weights or stretch bands; silver sneakers if access to the [facility]  Please visit the osteoporosis foundation.org for up to date recommendations  Medicare now request all "baby boomers" test for possible exposure to Hepatitis C. Many may have been exposed due to dental work, tatoo's, vaccinations when young. The Hepatitis C virus is dormant for many years and then sometimes will cause liver cancer. If you gave blood in the past 15 years, you were most likely checked for Hep C. If you rec'd blood; you may want to consider testing or if you are high risk for any other reason.   Will have drawn at her next blood draw  These are the goals we discussed: Goals    . Weight (lb) < 150 lb (68 kg)     Portion control and eating less sweets Focus on good nutrition  Leans and green  Watch sodium          This is a list of the screening recommended for you and due dates:  Health Maintenance  Topic Date  Due  .  Hepatitis C: One time screening is recommended by Center for Disease Control  (CDC) for  adults born from [birth year]through 1967.   0January 13, 1952 . Colon Cancer Screening  04/23/2019  . Mammogram  09/03/2019  . Tetanus Vaccine  06/10/2020  . Flu Shot  Completed  . DEXA scan (bone density measurement)  Completed  . Pneumonia vaccines  Completed   Prevention of falls: Remove rugs or any tripping hazards in the home Use Non slip mats in bathtubs and showers Placing grab bars next to the toilet and or shower Placing handrails on both sides of the stair way Adding extra lighting in the home.   Personal safety issues reviewed:  1. Consider starting a community watch program per [facility]2.  Changes batteries is smoke detector and/or carbon monoxide detector  3.  If you have firearms; keep them in a safe place 4.  Wear protection when in the sun; Always wear sunscreen or a hat; It is good to have your doctor check your skin annually or review any new areas of concern 5. Driving safety; Keep in the right lane; stay 3 car lengths behind the car in front of you on the highway; look 3 times prior to pulling out; carry your cell phone everywhere you go!  Health Maintenance, Female Adopting a healthy lifestyle and getting preventive care can go a long way to promote health and wellness. Talk with your health care provider about what schedule of regular examinations is right for you. This is a good chance for you to check in with your provider about disease prevention and staying healthy. In between checkups, there are plenty of things you can do on your own. Experts have done a lot of research about which lifestyle changes and preventive measures are most likely to keep you healthy. Ask your health care provider for more information. Weight and diet Eat a healthy diet  Be sure to include plenty of vegetables, fruits, low-fat dairy products, and lean protein.  Do not eat  a lot of foods high in solid fats, added sugars, or salt.  Get regular exercise. This is one of the most important things you can do for your health. ? Most adults should exercise for at least 150 minutes each week. The exercise should increase your heart rate and make you sweat (moderate-intensity exercise). ? Most adults should also do strengthening exercises at least twice a week. This is in addition to the moderate-intensity exercise.  Maintain a healthy weight  Body mass index (BMI) is a measurement that can be used to identify possible weight problems. It estimates body fat based on height and weight. Your health care provider can help determine your BMI and help you achieve or maintain a healthy weight.  For females 63 years of age and older: ? A BMI below 18.5 is considered underweight. ? A BMI of 18.5 to 24.9 is normal. ? A BMI of 25 to 29.9 is considered overweight. ? A BMI of 30 and above is considered obese.  Watch levels of cholesterol and blood lipids  You should start having your blood tested for lipids and cholesterol at 69 years of age, then have this test every 5 years.  You may need to have your cholesterol levels checked more often if: ? Your lipid or cholesterol levels are high. ? You are older than 69 years of age. ? You are at high risk for heart disease.  Cancer screening Lung Cancer  Lung cancer screening is recommended for adults 53-36 years old who are at high risk for lung cancer because of a history of smoking.  A yearly low-dose CT scan of the lungs is recommended for people who: ? Currently smoke. ? Have quit within the past 15 years. ? Have at least a 30-pack-year history of smoking. A pack year is smoking an average of one pack of cigarettes a day for 1 year.  Yearly screening should continue until it has been 15 years since you quit.  Yearly screening should stop if you develop a health problem that would prevent you from having lung cancer  treatment.  Breast Cancer  Practice breast self-awareness. This means understanding how your breasts normally appear and feel.  It also means doing regular breast self-exams. Let your health care provider know about any changes, no matter how small.  If you are in your 20s or 30s, you should have a clinical breast exam (CBE) by a health care provider every 1-3 years as part of a regular health exam.  If you are 39 or older, have a CBE every year. Also consider having a breast X-ray (mammogram) every year.  If you have a family history of breast cancer, talk to your health care provider about genetic screening.  If you are at high  risk for breast cancer, talk to your health care provider about having an MRI and a mammogram every year.  Breast cancer gene (BRCA) assessment is recommended for women who have family members with BRCA-related cancers. BRCA-related cancers include: ? Breast. ? Ovarian. ? Tubal. ? Peritoneal cancers.  Results of the assessment will determine the need for genetic counseling and BRCA1 and BRCA2 testing.  Cervical Cancer Your health care provider may recommend that you be screened regularly for cancer of the pelvic organs (ovaries, uterus, and vagina). This screening involves a pelvic examination, including checking for microscopic changes to the surface of your cervix (Pap test). You may be encouraged to have this screening done every 3 years, beginning at age 11.  For women ages 67-65, health care providers may recommend pelvic exams and Pap testing every 3 years, or they may recommend the Pap and pelvic exam, combined with testing for human papilloma virus (HPV), every 5 years. Some types of HPV increase your risk of cervical cancer. Testing for HPV may also be done on women of any age with unclear Pap test results.  Other health care providers may not recommend any screening for nonpregnant women who are considered low risk for pelvic cancer and who do not have  symptoms. Ask your health care provider if a screening pelvic exam is right for you.  If you have had past treatment for cervical cancer or a condition that could lead to cancer, you need Pap tests and screening for cancer for at least 20 years after your treatment. If Pap tests have been discontinued, your risk factors (such as having a new sexual partner) need to be reassessed to determine if screening should resume. Some women have medical problems that increase the chance of getting cervical cancer. In these cases, your health care provider may recommend more frequent screening and Pap tests.  Colorectal Cancer  This type of cancer can be detected and often prevented.  Routine colorectal cancer screening usually begins at 69 years of age and continues through 69 years of age.  Your health care provider may recommend screening at an earlier age if you have risk factors for colon cancer.  Your health care provider may also recommend using home test kits to check for hidden blood in the stool.  A small camera at the end of a tube can be used to examine your colon directly (sigmoidoscopy or colonoscopy). This is done to check for the earliest forms of colorectal cancer.  Routine screening usually begins at age 55.  Direct examination of the colon should be repeated every 5-10 years through 69 years of age. However, you may need to be screened more often if early forms of precancerous polyps or small growths are found.  Skin Cancer  Check your skin from head to toe regularly.  Tell your health care provider about any new moles or changes in moles, especially if there is a change in a mole's shape or color.  Also tell your health care provider if you have a mole that is larger than the size of a pencil eraser.  Always use sunscreen. Apply sunscreen liberally and repeatedly throughout the day.  Protect yourself by wearing long sleeves, pants, a wide-brimmed hat, and sunglasses whenever you  are outside.  Heart disease, diabetes, and high blood pressure  High blood pressure causes heart disease and increases the risk of stroke. High blood pressure is more likely to develop in: ? People who have blood pressure in the high end  of the normal range (130-139/85-89 mm Hg). ? People who are overweight or obese. ? People who are African American.  If you are 72-31 years of age, have your blood pressure checked every 3-5 years. If you are 40 years of age or older, have your blood pressure checked every year. You should have your blood pressure measured twice-once when you are at a hospital or clinic, and once when you are not at a hospital or clinic. Record the average of the two measurements. To check your blood pressure when you are not at a hospital or clinic, you can use: ? An automated blood pressure machine at a pharmacy. ? A home blood pressure monitor.  If you are between 62 years and 79 years old, ask your health care provider if you should take aspirin to prevent strokes.  Have regular diabetes screenings. This involves taking a blood sample to check your fasting blood sugar level. ? If you are at a normal weight and have a low risk for diabetes, have this test once every three years after 69 years of age. ? If you are overweight and have a high risk for diabetes, consider being tested at a younger age or more often. Preventing infection Hepatitis B  If you have a higher risk for hepatitis B, you should be screened for this virus. You are considered at high risk for hepatitis B if: ? You were born in a country where hepatitis B is common. Ask your health care provider which countries are considered high risk. ? Your parents were born in a high-risk country, and you have not been immunized against hepatitis B (hepatitis B vaccine). ? You have HIV or AIDS. ? You use needles to inject street drugs. ? You live with someone who has hepatitis B. ? You have had sex with someone who  has hepatitis B. ? You get hemodialysis treatment. ? You take certain medicines for conditions, including cancer, organ transplantation, and autoimmune conditions.  Hepatitis C  Blood testing is recommended for: ? Everyone born from [birth year] through 1967. ? Anyone with known risk factors for hepatitis C.  Sexually transmitted infections (STIs)  You should be screened for sexually transmitted infections (STIs) including gonorrhea and chlamydia if: ? You are sexually active and are younger than 69 years of age. ? You are older than 69 years of age and your health care provider tells you that you are at risk for this type of infection. ? Your sexual activity has changed since you were last screened and you are at an increased risk for chlamydia or gonorrhea. Ask your health care provider if you are at risk.  If you do not have HIV, but are at risk, it may be recommended that you take a prescription medicine daily to prevent HIV infection. This is called pre-exposure prophylaxis (PrEP). You are considered at risk if: ? You are sexually active and do not regularly use condoms or know the HIV status of your partner(s). ? You take drugs by injection. ? You are sexually active with a partner who has HIV.  Talk with your health care provider about whether you are at high risk of being infected with HIV. If you choose to begin PrEP, you should first be tested for HIV. You should then be tested every 3 months for as long as you are taking PrEP. Pregnancy  If you are premenopausal and you may become pregnant, ask your health care provider about preconception counseling.  If you may become  pregnant, take 400 to 800 micrograms (mcg) of folic acid every day.  If you want to prevent pregnancy, talk to your health care provider about birth control (contraception). Osteoporosis and menopause  Osteoporosis is a disease in which the bones lose minerals and strength with aging. This can result in serious bone  fractures. Your risk for osteoporosis can be identified using a bone density scan.  If you are 63 years of age or older, or if you are at risk for osteoporosis and fractures, ask your health care provider if you should be screened.  Ask your health care provider whether you should take a calcium or vitamin D supplement to lower your risk for osteoporosis.  Menopause may have certain physical symptoms and risks.  Hormone replacement therapy may reduce some of these symptoms and risks. Talk to your health care provider about whether hormone replacement therapy is right for you. Follow these instructions at home:  Schedule regular health, dental, and eye exams.  Stay current with your immunizations.  Do not use any tobacco products including cigarettes, chewing tobacco, or electronic cigarettes.  If you are pregnant, do not drink alcohol.  If you are breastfeeding, limit how much and how often you drink alcohol.  Limit alcohol intake to no more than 1 drink per day for nonpregnant women. One drink equals 12 ounces of beer, 5 ounces of wine, or 1 ounces of hard liquor.  Do not use street drugs.  Do not share needles.  Ask your health care provider for help if you need support or information about quitting drugs.  Tell your health care provider if you often feel depressed.  Tell your health care provider if you have ever been abused or do not feel safe at home. This information is not intended to replace advice given to you by your health care provider. Make sure you discuss any questions you have with your health care provider. Document Released: 05/26/2011 Document Revised: 04/17/2016 Document Reviewed: 08/14/2015 Elsevier Interactive Patient Education  2018 [place] in the Home Falls can cause injuries and can affect people from all age groups. There are many simple things that you can do to make your home safe and to help prevent falls. What can I do on  the outside of my home?  Regularly repair the edges of walkways and driveways and fix any cracks.  Remove high doorway thresholds.  Trim any shrubbery on the main path into your home.  Use bright outdoor lighting.  Clear walkways of debris and clutter, including tools and rocks.  Regularly check that handrails are securely fastened and in good repair. Both sides of any steps should have handrails.  Install guardrails along the edges of any raised decks or porches.  Have leaves, snow, and ice cleared regularly.  Use sand or salt on walkways during winter months.  In the garage, clean up any spills right away, including grease or oil spills. What can I do in the bathroom?  Use night lights.  Install grab bars by the toilet and in the tub and shower. Do not use towel bars as grab bars.  Use non-skid mats or decals on the floor of the tub or shower.  If you need to sit down while you are in the shower, use a plastic, non-slip stool.  Keep the floor dry. Immediately clean up any water that spills on the floor.  Remove soap buildup in the tub or shower on a regular basis.  Attach  bath mats securely with double-sided non-slip rug tape.  Remove throw rugs and other tripping hazards from the floor. What can I do in the bedroom?  Use night lights.  Make sure that a bedside light is easy to reach.  Do not use oversized bedding that drapes onto the floor.  Have a firm chair that has side arms to use for getting dressed.  Remove throw rugs and other tripping hazards from the floor. What can I do in the kitchen?  Clean up any spills right away.  Avoid walking on wet floors.  Place frequently used items in easy-to-reach places.  If you need to reach for something above you, use a sturdy step stool that has a grab bar.  Keep electrical cables out of the way.  Do not use floor polish or wax that makes floors slippery. If you have to use wax, make sure that it is non-skid  floor wax.  Remove throw rugs and other tripping hazards from the floor. What can I do in the stairways?  Do not leave any items on the stairs.  Make sure that there are handrails on both sides of the stairs. Fix handrails that are broken or loose. Make sure that handrails are as long as the stairways.  Check any carpeting to make sure that it is firmly attached to the stairs. Fix any carpet that is loose or worn.  Avoid having throw rugs at the top or bottom of stairways, or secure the rugs with carpet tape to prevent them from moving.  Make sure that you have a light switch at the top of the stairs and the bottom of the stairs. If you do not have them, have them installed. What are some other fall prevention tips?  Wear closed-toe shoes that fit well and support your feet. Wear shoes that have rubber soles or low heels.  When you use a stepladder, make sure that it is completely opened and that the sides are firmly locked. Have someone hold the ladder while you are using it. Do not climb a closed stepladder.  Add color or contrast paint or tape to grab bars and handrails in your home. Place contrasting color strips on the first and last steps.  Use mobility aids as needed, such as canes, walkers, scooters, and crutches.  Turn on lights if it is dark. Replace any light bulbs that burn out.  Set up furniture so that there are clear paths. Keep the furniture in the same spot.  Fix any uneven floor surfaces.  Choose a carpet design that does not hide the edge of steps of a stairway.  Be aware of any and all pets.  Review your medicines with your healthcare provider. Some medicines can cause dizziness or changes in blood pressure, which increase your risk of falling. Talk with your health care provider about other ways that you can decrease your risk of falls. This may include working with a physical therapist or trainer to improve your strength, balance, and endurance. This  information is not intended to replace advice given to you by your health care provider. Make sure you discuss any questions you have with your health care provider. Document Released: 10/31/2002 Document Revised: 04/08/2016 Document Reviewed: 12/15/2014 Elsevier Interactive Patient Education  [employer].

## 2017-12-30 ENCOUNTER — Ambulatory Visit: Payer: Medicare Other

## 2018-01-11 DIAGNOSIS — J3081 Allergic rhinitis due to animal (cat) (dog) hair and dander: Secondary | ICD-10-CM | POA: Diagnosis not present

## 2018-01-11 DIAGNOSIS — J3089 Other allergic rhinitis: Secondary | ICD-10-CM | POA: Diagnosis not present

## 2018-01-11 DIAGNOSIS — J301 Allergic rhinitis due to pollen: Secondary | ICD-10-CM | POA: Diagnosis not present

## 2018-01-14 DIAGNOSIS — M160 Bilateral primary osteoarthritis of hip: Secondary | ICD-10-CM | POA: Diagnosis not present

## 2018-01-16 ENCOUNTER — Other Ambulatory Visit: Payer: Self-pay | Admitting: Family Medicine

## 2018-01-19 DIAGNOSIS — J3081 Allergic rhinitis due to animal (cat) (dog) hair and dander: Secondary | ICD-10-CM | POA: Diagnosis not present

## 2018-01-19 DIAGNOSIS — J3089 Other allergic rhinitis: Secondary | ICD-10-CM | POA: Diagnosis not present

## 2018-01-19 DIAGNOSIS — J301 Allergic rhinitis due to pollen: Secondary | ICD-10-CM | POA: Diagnosis not present

## 2018-01-26 DIAGNOSIS — J3081 Allergic rhinitis due to animal (cat) (dog) hair and dander: Secondary | ICD-10-CM | POA: Diagnosis not present

## 2018-01-26 DIAGNOSIS — J301 Allergic rhinitis due to pollen: Secondary | ICD-10-CM | POA: Diagnosis not present

## 2018-01-26 DIAGNOSIS — J3089 Other allergic rhinitis: Secondary | ICD-10-CM | POA: Diagnosis not present

## 2018-02-04 ENCOUNTER — Other Ambulatory Visit: Payer: Self-pay | Admitting: Family Medicine

## 2018-02-04 DIAGNOSIS — J3089 Other allergic rhinitis: Secondary | ICD-10-CM | POA: Diagnosis not present

## 2018-02-04 DIAGNOSIS — J3081 Allergic rhinitis due to animal (cat) (dog) hair and dander: Secondary | ICD-10-CM | POA: Diagnosis not present

## 2018-02-04 DIAGNOSIS — J301 Allergic rhinitis due to pollen: Secondary | ICD-10-CM | POA: Diagnosis not present

## 2018-02-09 DIAGNOSIS — H40013 Open angle with borderline findings, low risk, bilateral: Secondary | ICD-10-CM | POA: Diagnosis not present

## 2018-02-09 DIAGNOSIS — H2513 Age-related nuclear cataract, bilateral: Secondary | ICD-10-CM | POA: Diagnosis not present

## 2018-02-11 DIAGNOSIS — J301 Allergic rhinitis due to pollen: Secondary | ICD-10-CM | POA: Diagnosis not present

## 2018-02-11 DIAGNOSIS — J3089 Other allergic rhinitis: Secondary | ICD-10-CM | POA: Diagnosis not present

## 2018-02-11 DIAGNOSIS — J3081 Allergic rhinitis due to animal (cat) (dog) hair and dander: Secondary | ICD-10-CM | POA: Diagnosis not present

## 2018-02-18 DIAGNOSIS — J3081 Allergic rhinitis due to animal (cat) (dog) hair and dander: Secondary | ICD-10-CM | POA: Diagnosis not present

## 2018-02-19 DIAGNOSIS — J301 Allergic rhinitis due to pollen: Secondary | ICD-10-CM | POA: Diagnosis not present

## 2018-02-19 DIAGNOSIS — J3081 Allergic rhinitis due to animal (cat) (dog) hair and dander: Secondary | ICD-10-CM | POA: Diagnosis not present

## 2018-02-19 DIAGNOSIS — J3089 Other allergic rhinitis: Secondary | ICD-10-CM | POA: Diagnosis not present

## 2018-02-23 DIAGNOSIS — J3081 Allergic rhinitis due to animal (cat) (dog) hair and dander: Secondary | ICD-10-CM | POA: Diagnosis not present

## 2018-02-23 DIAGNOSIS — R05 Cough: Secondary | ICD-10-CM | POA: Diagnosis not present

## 2018-02-23 DIAGNOSIS — J3089 Other allergic rhinitis: Secondary | ICD-10-CM | POA: Diagnosis not present

## 2018-02-23 DIAGNOSIS — J301 Allergic rhinitis due to pollen: Secondary | ICD-10-CM | POA: Diagnosis not present

## 2018-03-04 DIAGNOSIS — J3081 Allergic rhinitis due to animal (cat) (dog) hair and dander: Secondary | ICD-10-CM | POA: Diagnosis not present

## 2018-03-04 DIAGNOSIS — J3089 Other allergic rhinitis: Secondary | ICD-10-CM | POA: Diagnosis not present

## 2018-03-04 DIAGNOSIS — J301 Allergic rhinitis due to pollen: Secondary | ICD-10-CM | POA: Diagnosis not present

## 2018-03-10 ENCOUNTER — Other Ambulatory Visit: Payer: Self-pay | Admitting: Family Medicine

## 2018-03-16 DIAGNOSIS — J3081 Allergic rhinitis due to animal (cat) (dog) hair and dander: Secondary | ICD-10-CM | POA: Diagnosis not present

## 2018-03-16 DIAGNOSIS — J3089 Other allergic rhinitis: Secondary | ICD-10-CM | POA: Diagnosis not present

## 2018-03-16 DIAGNOSIS — J301 Allergic rhinitis due to pollen: Secondary | ICD-10-CM | POA: Diagnosis not present

## 2018-03-19 ENCOUNTER — Other Ambulatory Visit: Payer: Self-pay | Admitting: Family Medicine

## 2018-04-08 DIAGNOSIS — J3081 Allergic rhinitis due to animal (cat) (dog) hair and dander: Secondary | ICD-10-CM | POA: Diagnosis not present

## 2018-04-08 DIAGNOSIS — J301 Allergic rhinitis due to pollen: Secondary | ICD-10-CM | POA: Diagnosis not present

## 2018-04-08 DIAGNOSIS — J3089 Other allergic rhinitis: Secondary | ICD-10-CM | POA: Diagnosis not present

## 2018-04-16 ENCOUNTER — Other Ambulatory Visit: Payer: Self-pay | Admitting: Family Medicine

## 2018-04-20 ENCOUNTER — Other Ambulatory Visit: Payer: Self-pay | Admitting: Family Medicine

## 2018-05-02 ENCOUNTER — Other Ambulatory Visit: Payer: Self-pay | Admitting: Family Medicine

## 2018-05-03 ENCOUNTER — Encounter: Payer: Self-pay | Admitting: Family Medicine

## 2018-05-03 DIAGNOSIS — J3081 Allergic rhinitis due to animal (cat) (dog) hair and dander: Secondary | ICD-10-CM | POA: Diagnosis not present

## 2018-05-03 DIAGNOSIS — J301 Allergic rhinitis due to pollen: Secondary | ICD-10-CM | POA: Diagnosis not present

## 2018-05-03 DIAGNOSIS — J3089 Other allergic rhinitis: Secondary | ICD-10-CM | POA: Diagnosis not present

## 2018-05-12 ENCOUNTER — Encounter: Payer: Self-pay | Admitting: Family Medicine

## 2018-05-12 ENCOUNTER — Ambulatory Visit (INDEPENDENT_AMBULATORY_CARE_PROVIDER_SITE_OTHER): Payer: Medicare Other | Admitting: Family Medicine

## 2018-05-12 VITALS — BP 122/78 | HR 50 | Temp 98.0°F | Ht 64.5 in | Wt 159.6 lb

## 2018-05-12 DIAGNOSIS — Z0000 Encounter for general adult medical examination without abnormal findings: Secondary | ICD-10-CM

## 2018-05-12 DIAGNOSIS — G8929 Other chronic pain: Secondary | ICD-10-CM | POA: Diagnosis not present

## 2018-05-12 DIAGNOSIS — M542 Cervicalgia: Secondary | ICD-10-CM

## 2018-05-12 DIAGNOSIS — M25512 Pain in left shoulder: Secondary | ICD-10-CM

## 2018-05-12 LAB — CBC WITH DIFFERENTIAL/PLATELET
BASOS ABS: 0 10*3/uL (ref 0.0–0.1)
Basophils Relative: 0.8 % (ref 0.0–3.0)
Eosinophils Absolute: 0.2 10*3/uL (ref 0.0–0.7)
Eosinophils Relative: 4 % (ref 0.0–5.0)
HEMATOCRIT: 41.4 % (ref 36.0–46.0)
HEMOGLOBIN: 13.8 g/dL (ref 12.0–15.0)
Lymphocytes Relative: 32 % (ref 12.0–46.0)
Lymphs Abs: 1.7 10*3/uL (ref 0.7–4.0)
MCHC: 33.4 g/dL (ref 30.0–36.0)
MCV: 88.8 fl (ref 78.0–100.0)
MONOS PCT: 8.8 % (ref 3.0–12.0)
Monocytes Absolute: 0.5 10*3/uL (ref 0.1–1.0)
NEUTROS ABS: 2.9 10*3/uL (ref 1.4–7.7)
Neutrophils Relative %: 54.4 % (ref 43.0–77.0)
PLATELETS: 340 10*3/uL (ref 150.0–400.0)
RBC: 4.66 Mil/uL (ref 3.87–5.11)
RDW: 13.6 % (ref 11.5–15.5)
WBC: 5.4 10*3/uL (ref 4.0–10.5)

## 2018-05-12 LAB — LIPID PANEL
CHOL/HDL RATIO: 4
CHOLESTEROL: 183 mg/dL (ref 0–200)
HDL: 45.8 mg/dL (ref >39.00)
LDL Cholesterol: 110 mg/dL — ABNORMAL HIGH (ref 0–99)
NonHDL: 137
TRIGLYCERIDES: 137 mg/dL (ref 0.0–149.0)
VLDL: 27.4 mg/dL (ref 0.0–40.0)

## 2018-05-12 LAB — BASIC METABOLIC PANEL
BUN: 11 mg/dL (ref 6–23)
CALCIUM: 9.6 mg/dL (ref 8.4–10.5)
CO2: 31 meq/L (ref 19–32)
Chloride: 102 mEq/L (ref 96–112)
Creatinine, Ser: 0.68 mg/dL (ref 0.40–1.20)
GFR: 91.22 mL/min (ref >60.00)
Glucose, Bld: 82 mg/dL (ref 70–99)
Potassium: 3.6 mEq/L (ref 3.5–5.1)
Sodium: 141 mEq/L (ref 135–145)

## 2018-05-12 LAB — HEPATIC FUNCTION PANEL
ALK PHOS: 57 U/L (ref 39–117)
ALT: 13 U/L (ref 0–35)
AST: 13 U/L (ref 0–37)
Albumin: 4.2 g/dL (ref 3.5–5.2)
BILIRUBIN DIRECT: 0.1 mg/dL (ref 0.0–0.3)
BILIRUBIN TOTAL: 0.6 mg/dL (ref 0.2–1.2)
TOTAL PROTEIN: 7.2 g/dL (ref 6.0–8.3)

## 2018-05-12 LAB — TSH: TSH: 1.25 u[IU]/mL (ref 0.35–4.50)

## 2018-05-12 NOTE — Progress Notes (Signed)
Subjective:     Patient ID: Theresa Bradley, female   DOB: [birth year], 67 y.o.   MRN: [id number]  HPI Patient seen for physical exam. She several months ago had hip dislocation. 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 Her surgeon stated in this occurred a third time they would have to look at possible surgery. Ambulating without difficulties at this time.  She has history of GERD. She was able tape off Protonix last year. Currently takes Pepcid and symptoms fairly well controlled. Occasional breakthrough symptoms. She has hypertension which has been stable. She has hypothyroidism on levothyroxin.  She has had some chronic left neck and left shoulder pains.  We had planned left shoulder and neck films back in the Fall and then she had the hip dislocation.  Health maintenance:  -Colonoscopy will be due next year. She would like to consider cologuard -Tetanus up-to-date -Patient's had both pneumonia vaccines -DEXA scan 2 years ago osteopenia -Last mammogram and/10/18 -Pap smear-patient declines  Past Medical History:  Diagnosis Date  . COUGH, CHRONIC 05/10/2010  . HYPERTENSION 04/16/2009  . HYPOTHYROIDISM 04/16/2009  . OSTEOARTHRITIS 04/16/2009  . SENILE LENTIGO 06/10/2010   Past Surgical History:  Procedure Laterality Date  . BREAST EXCISIONAL BIOPSY Right   . BREAST EXCISIONAL BIOPSY Left   . BREAST LUMPECTOMY Right    radiation only  . BREAST SURGERY  2009   X 2, cancer stage 0, lumpectomy  . [place]  . CHOLECYSTECTOMY    . DILATATION & CURETTAGE/HYSTEROSCOPY WITH MYOSURE  11/2015  . HERNIA REPAIR  2009  . JOINT REPLACEMENT  2008   hip    reports that she has never smoked. She has never used smokeless tobacco. She reports that she does not use drugs. Her alcohol history is not on file. family history includes Arthritis in her other; Cancer in her other; Hyperlipidemia in her father; Hypertension in her  other. Allergies  Allergen Reactions  . Codeine Sulfate Nausea And Vomiting  . Erythromycin Base Other (See Comments)    Stomach ache  . Esomeprazole Magnesium     REACTION: GI upset     Review of Systems  Constitutional: Negative for activity change, appetite change, fatigue, fever and unexpected weight change.  HENT: Negative for ear pain, hearing loss, sore throat and trouble swallowing.   Eyes: Negative for visual disturbance.  Respiratory: Negative for cough and shortness of breath.   Cardiovascular: Negative for chest pain and palpitations.  Gastrointestinal: Negative for abdominal pain, blood in stool, constipation and diarrhea.  Genitourinary: Negative for dysuria and hematuria.  Musculoskeletal: Negative for arthralgias, back pain and myalgias.  Skin: Negative for rash.  Neurological: Negative for dizziness, syncope and headaches.  Hematological: Negative for adenopathy.  Psychiatric/Behavioral: Negative for confusion and dysphoric mood.       Objective:   Physical Exam  Constitutional: She is oriented to person, place, and time. She appears well-developed and well-nourished.  HENT:  Head: Normocephalic and atraumatic.  Eyes: Pupils are equal, round, and reactive to light. EOM are normal.  Neck: Normal range of motion. Neck supple. No thyromegaly present.  Cardiovascular: Normal rate, regular rhythm and normal heart sounds.  No murmur heard. Pulmonary/Chest: Breath sounds normal. No respiratory distress. She has no wheezes. She has no rales.  Abdominal: Soft. Bowel sounds are normal. She exhibits no distension and no mass. There is no tenderness. There is no rebound and no guarding.  Musculoskeletal:  Normal range of motion. She exhibits no edema.  Lymphadenopathy:    She has no cervical adenopathy.  Neurological: She is alert and oriented to person, place, and time. She displays normal reflexes. No cranial nerve deficit.  Skin: No rash noted.  Psychiatric: She has  a normal mood and affect. Her behavior is normal. Judgment and thought content normal.       Assessment:     #1 Physical exam. Several issues discussed as below  #2 chronic left neck and shoulder pain    Plan:     -Continue with yearly flu vaccine -She has already had new shingles vaccine -Continue with yearly mammogram -She was given information on cologuard and she will check with insurance coverage as she is contemplating this in place of repeat colonoscopy -consider repeat DEXA by next year. -Patient will return for x-rays cervical spine and left shoulder  [name] MD [facility]

## 2018-05-12 NOTE — Patient Instructions (Signed)
Check on coverage for Cologuard and let me know if interested.

## 2018-05-13 ENCOUNTER — Encounter: Payer: Self-pay | Admitting: Family Medicine

## 2018-05-18 DIAGNOSIS — J3081 Allergic rhinitis due to animal (cat) (dog) hair and dander: Secondary | ICD-10-CM | POA: Diagnosis not present

## 2018-05-18 DIAGNOSIS — J301 Allergic rhinitis due to pollen: Secondary | ICD-10-CM | POA: Diagnosis not present

## 2018-05-18 DIAGNOSIS — J3089 Other allergic rhinitis: Secondary | ICD-10-CM | POA: Diagnosis not present

## 2018-05-19 NOTE — Addendum Note (Signed)
Addended by: [name] on: 05/19/2018 07:35 AM   Modules accepted: Orders

## 2018-05-21 ENCOUNTER — Telehealth: Payer: Self-pay | Admitting: *Deleted

## 2018-05-21 NOTE — Telephone Encounter (Signed)
Left message on machine for patient to go to the [facility] office for x-rays.   CRM created

## 2018-06-02 DIAGNOSIS — J301 Allergic rhinitis due to pollen: Secondary | ICD-10-CM | POA: Diagnosis not present

## 2018-06-02 DIAGNOSIS — J3081 Allergic rhinitis due to animal (cat) (dog) hair and dander: Secondary | ICD-10-CM | POA: Diagnosis not present

## 2018-06-02 DIAGNOSIS — J3089 Other allergic rhinitis: Secondary | ICD-10-CM | POA: Diagnosis not present

## 2018-06-06 ENCOUNTER — Other Ambulatory Visit: Payer: Self-pay | Admitting: Family Medicine

## 2018-06-07 ENCOUNTER — Ambulatory Visit (INDEPENDENT_AMBULATORY_CARE_PROVIDER_SITE_OTHER)
Admission: RE | Admit: 2018-06-07 | Discharge: 2018-06-07 | Disposition: A | Discharge status: Home / Self Care | Payer: Medicare Other | Source: Ambulatory Visit | Attending: Family Medicine | Admitting: Family Medicine

## 2018-06-07 DIAGNOSIS — M542 Cervicalgia: Secondary | ICD-10-CM

## 2018-06-07 DIAGNOSIS — G8929 Other chronic pain: Secondary | ICD-10-CM | POA: Diagnosis not present

## 2018-06-07 DIAGNOSIS — M19012 Primary osteoarthritis, left shoulder: Secondary | ICD-10-CM | POA: Diagnosis not present

## 2018-06-07 DIAGNOSIS — M25512 Pain in left shoulder: Secondary | ICD-10-CM

## 2018-06-07 NOTE — Telephone Encounter (Signed)
OK to refill .  We do have x-ray capability here now and could give her option of getting x-rays here.

## 2018-06-07 NOTE — Telephone Encounter (Signed)
Patient has not scheduled her x-ray. Okay to refill?

## 2018-06-09 DIAGNOSIS — J3081 Allergic rhinitis due to animal (cat) (dog) hair and dander: Secondary | ICD-10-CM | POA: Diagnosis not present

## 2018-06-21 DIAGNOSIS — J301 Allergic rhinitis due to pollen: Secondary | ICD-10-CM | POA: Diagnosis not present

## 2018-06-21 DIAGNOSIS — J3089 Other allergic rhinitis: Secondary | ICD-10-CM | POA: Diagnosis not present

## 2018-06-21 DIAGNOSIS — J3081 Allergic rhinitis due to animal (cat) (dog) hair and dander: Secondary | ICD-10-CM | POA: Diagnosis not present

## 2018-06-25 DIAGNOSIS — J3089 Other allergic rhinitis: Secondary | ICD-10-CM | POA: Diagnosis not present

## 2018-07-07 DIAGNOSIS — J3089 Other allergic rhinitis: Secondary | ICD-10-CM | POA: Diagnosis not present

## 2018-07-07 DIAGNOSIS — J3081 Allergic rhinitis due to animal (cat) (dog) hair and dander: Secondary | ICD-10-CM | POA: Diagnosis not present

## 2018-07-07 DIAGNOSIS — J301 Allergic rhinitis due to pollen: Secondary | ICD-10-CM | POA: Diagnosis not present

## 2018-07-16 ENCOUNTER — Other Ambulatory Visit: Payer: Self-pay | Admitting: Family Medicine

## 2018-07-21 DIAGNOSIS — J301 Allergic rhinitis due to pollen: Secondary | ICD-10-CM | POA: Diagnosis not present

## 2018-07-21 DIAGNOSIS — J3081 Allergic rhinitis due to animal (cat) (dog) hair and dander: Secondary | ICD-10-CM | POA: Diagnosis not present

## 2018-07-21 DIAGNOSIS — J3089 Other allergic rhinitis: Secondary | ICD-10-CM | POA: Diagnosis not present

## 2018-07-29 ENCOUNTER — Other Ambulatory Visit: Payer: Self-pay | Admitting: Family Medicine

## 2018-08-04 DIAGNOSIS — J3081 Allergic rhinitis due to animal (cat) (dog) hair and dander: Secondary | ICD-10-CM | POA: Diagnosis not present

## 2018-08-04 DIAGNOSIS — J3089 Other allergic rhinitis: Secondary | ICD-10-CM | POA: Diagnosis not present

## 2018-08-04 DIAGNOSIS — J301 Allergic rhinitis due to pollen: Secondary | ICD-10-CM | POA: Diagnosis not present

## 2018-08-16 DIAGNOSIS — H40013 Open angle with borderline findings, low risk, bilateral: Secondary | ICD-10-CM | POA: Diagnosis not present

## 2018-08-18 DIAGNOSIS — J3081 Allergic rhinitis due to animal (cat) (dog) hair and dander: Secondary | ICD-10-CM | POA: Diagnosis not present

## 2018-08-18 DIAGNOSIS — J3089 Other allergic rhinitis: Secondary | ICD-10-CM | POA: Diagnosis not present

## 2018-08-18 DIAGNOSIS — J301 Allergic rhinitis due to pollen: Secondary | ICD-10-CM | POA: Diagnosis not present

## 2018-08-25 DIAGNOSIS — J301 Allergic rhinitis due to pollen: Secondary | ICD-10-CM | POA: Diagnosis not present

## 2018-08-25 DIAGNOSIS — J3089 Other allergic rhinitis: Secondary | ICD-10-CM | POA: Diagnosis not present

## 2018-08-25 DIAGNOSIS — J3081 Allergic rhinitis due to animal (cat) (dog) hair and dander: Secondary | ICD-10-CM | POA: Diagnosis not present

## 2018-08-30 DIAGNOSIS — J3089 Other allergic rhinitis: Secondary | ICD-10-CM | POA: Diagnosis not present

## 2018-09-03 DIAGNOSIS — J301 Allergic rhinitis due to pollen: Secondary | ICD-10-CM | POA: Diagnosis not present

## 2018-09-03 DIAGNOSIS — J3089 Other allergic rhinitis: Secondary | ICD-10-CM | POA: Diagnosis not present

## 2018-09-03 DIAGNOSIS — J3081 Allergic rhinitis due to animal (cat) (dog) hair and dander: Secondary | ICD-10-CM | POA: Diagnosis not present

## 2018-09-04 ENCOUNTER — Other Ambulatory Visit: Payer: Self-pay | Admitting: Family Medicine

## 2018-09-06 ENCOUNTER — Ambulatory Visit (INDEPENDENT_AMBULATORY_CARE_PROVIDER_SITE_OTHER): Payer: Medicare Other

## 2018-09-06 DIAGNOSIS — Z23 Encounter for immunization: Secondary | ICD-10-CM | POA: Diagnosis not present

## 2018-09-08 DIAGNOSIS — J3081 Allergic rhinitis due to animal (cat) (dog) hair and dander: Secondary | ICD-10-CM | POA: Diagnosis not present

## 2018-09-08 DIAGNOSIS — J301 Allergic rhinitis due to pollen: Secondary | ICD-10-CM | POA: Diagnosis not present

## 2018-09-13 ENCOUNTER — Other Ambulatory Visit: Payer: Self-pay | Admitting: Family Medicine

## 2018-09-13 NOTE — Telephone Encounter (Signed)
Last OV 05/12/18, No future OV  Last filled 11/23/17, # 60 with 0 refills

## 2018-09-14 NOTE — Telephone Encounter (Signed)
Refill once 

## 2018-09-22 ENCOUNTER — Other Ambulatory Visit: Payer: Self-pay | Admitting: Family Medicine

## 2018-09-23 DIAGNOSIS — J3081 Allergic rhinitis due to animal (cat) (dog) hair and dander: Secondary | ICD-10-CM | POA: Diagnosis not present

## 2018-09-23 DIAGNOSIS — J3089 Other allergic rhinitis: Secondary | ICD-10-CM | POA: Diagnosis not present

## 2018-09-23 DIAGNOSIS — J301 Allergic rhinitis due to pollen: Secondary | ICD-10-CM | POA: Diagnosis not present

## 2018-09-27 DIAGNOSIS — J3089 Other allergic rhinitis: Secondary | ICD-10-CM | POA: Diagnosis not present

## 2018-10-13 DIAGNOSIS — J301 Allergic rhinitis due to pollen: Secondary | ICD-10-CM | POA: Diagnosis not present

## 2018-10-13 DIAGNOSIS — J3081 Allergic rhinitis due to animal (cat) (dog) hair and dander: Secondary | ICD-10-CM | POA: Diagnosis not present

## 2018-10-13 DIAGNOSIS — J3089 Other allergic rhinitis: Secondary | ICD-10-CM | POA: Diagnosis not present

## 2018-10-26 ENCOUNTER — Other Ambulatory Visit: Payer: Self-pay | Admitting: Family Medicine

## 2018-10-26 DIAGNOSIS — Z1231 Encounter for screening mammogram for malignant neoplasm of breast: Secondary | ICD-10-CM

## 2018-10-27 DIAGNOSIS — J3089 Other allergic rhinitis: Secondary | ICD-10-CM | POA: Diagnosis not present

## 2018-10-27 DIAGNOSIS — J3081 Allergic rhinitis due to animal (cat) (dog) hair and dander: Secondary | ICD-10-CM | POA: Diagnosis not present

## 2018-10-27 DIAGNOSIS — J301 Allergic rhinitis due to pollen: Secondary | ICD-10-CM | POA: Diagnosis not present

## 2018-11-02 DIAGNOSIS — J3081 Allergic rhinitis due to animal (cat) (dog) hair and dander: Secondary | ICD-10-CM | POA: Diagnosis not present

## 2018-11-02 DIAGNOSIS — J301 Allergic rhinitis due to pollen: Secondary | ICD-10-CM | POA: Diagnosis not present

## 2018-11-05 DIAGNOSIS — J3081 Allergic rhinitis due to animal (cat) (dog) hair and dander: Secondary | ICD-10-CM | POA: Diagnosis not present

## 2018-11-05 DIAGNOSIS — J301 Allergic rhinitis due to pollen: Secondary | ICD-10-CM | POA: Diagnosis not present

## 2018-11-22 DIAGNOSIS — J301 Allergic rhinitis due to pollen: Secondary | ICD-10-CM | POA: Diagnosis not present

## 2018-11-22 DIAGNOSIS — J3081 Allergic rhinitis due to animal (cat) (dog) hair and dander: Secondary | ICD-10-CM | POA: Diagnosis not present

## 2018-11-22 DIAGNOSIS — J3089 Other allergic rhinitis: Secondary | ICD-10-CM | POA: Diagnosis not present

## 2018-11-26 DIAGNOSIS — J3081 Allergic rhinitis due to animal (cat) (dog) hair and dander: Secondary | ICD-10-CM | POA: Diagnosis not present

## 2018-11-26 DIAGNOSIS — J301 Allergic rhinitis due to pollen: Secondary | ICD-10-CM | POA: Diagnosis not present

## 2018-11-26 DIAGNOSIS — J3089 Other allergic rhinitis: Secondary | ICD-10-CM | POA: Diagnosis not present

## 2018-12-02 DIAGNOSIS — J301 Allergic rhinitis due to pollen: Secondary | ICD-10-CM | POA: Diagnosis not present

## 2018-12-02 DIAGNOSIS — J3081 Allergic rhinitis due to animal (cat) (dog) hair and dander: Secondary | ICD-10-CM | POA: Diagnosis not present

## 2018-12-02 DIAGNOSIS — J3089 Other allergic rhinitis: Secondary | ICD-10-CM | POA: Diagnosis not present

## 2018-12-03 ENCOUNTER — Other Ambulatory Visit: Payer: Self-pay | Admitting: Family Medicine

## 2018-12-06 ENCOUNTER — Ambulatory Visit
Admission: RE | Admit: 2018-12-06 | Discharge: 2018-12-06 | Disposition: A | Discharge status: Home / Self Care | Payer: Medicare Other | Source: Ambulatory Visit | Attending: Family Medicine | Admitting: Family Medicine

## 2018-12-06 DIAGNOSIS — Z1231 Encounter for screening mammogram for malignant neoplasm of breast: Secondary | ICD-10-CM

## 2018-12-06 HISTORY — DX: Malignant (primary) neoplasm, unspecified: C80.1

## 2018-12-06 HISTORY — DX: Personal history of irradiation: Z92.3

## 2018-12-06 HISTORY — DX: Malignant neoplasm of unspecified site of unspecified female breast: C50.919

## 2018-12-15 DIAGNOSIS — J3089 Other allergic rhinitis: Secondary | ICD-10-CM | POA: Diagnosis not present

## 2018-12-15 DIAGNOSIS — J301 Allergic rhinitis due to pollen: Secondary | ICD-10-CM | POA: Diagnosis not present

## 2018-12-15 DIAGNOSIS — J3081 Allergic rhinitis due to animal (cat) (dog) hair and dander: Secondary | ICD-10-CM | POA: Diagnosis not present

## 2018-12-30 NOTE — Progress Notes (Addendum)
Subjective:   Theresa Bradley is a 68 y.o. female who presents for Medicare Annual (Subsequent) preventive examination.  Review of Systems:  No ROS.  Medicare Wellness Visit. Additional risk factors are reflected in the social history.  Cardiac Risk Factors include: advanced age (>40men, >65 women);hypertension Sleep patterns: has interrupted sleep, has daytime sleepiness and gets up 2 times nightly to void.  Occasionally naps. Takes xanax HS prn, but pt. States she rarely really needs.Does not feel like her sleep regimine is affecting her quality of life.  Home Safety/Smoke Alarms: Feels safe in home. Smoke alarms in place.  Living environment; residence and Firearm Safety: split level / walkout. States the stairs she uses has sturdy guardrail. No use or need for DME at this time.  Seat Belt Safety/Bike Helmet: Wears seat belt.   Female:   Pap-  05/2010     Mammo- 11/2018     Dexa scan- 11/2015;  Order placed     CCS- 03/2009; requesting cologuard     Objective:     Vitals: BP 128/70 (BP Location: Right Arm, Patient Position: Sitting, Cuff Size: Normal)   Pulse 60   Resp 14   Ht 5' 1.5" (1.562 m) Comment: pt stated  Wt 162 lb (73.5 kg)   BMI 30.11 kg/m   Body mass index is 30.11 kg/m.  Advanced Directives 12/31/2018 12/29/2017 10/13/2017  Does Patient Have a Medical Advance Directive? Yes No No  Type of Paramedic of [place];Living will - -  Does patient want to make changes to medical advance directive? No - Patient declined - -  Copy of [place] in Chart? No - copy requested - -    Tobacco Social History   Tobacco Use  Smoking Status Never Smoker  Smokeless Tobacco Never Used  Tobacco Comment   father smoked when she was younger      Counseling given: Not Answered Comment: father smoked when she was younger    Past Medical History:  Diagnosis Date  . Breast cancer ([place]) 2009   right  . Cancer ([place])   . COUGH,  CHRONIC 05/10/2010  . HYPERTENSION 04/16/2009  . HYPOTHYROIDISM 04/16/2009  . OSTEOARTHRITIS 04/16/2009  . Personal history of radiation therapy   . SENILE LENTIGO 06/10/2010   Past Surgical History:  Procedure Laterality Date  . BREAST EXCISIONAL BIOPSY Right   . BREAST EXCISIONAL BIOPSY Left   . BREAST LUMPECTOMY Right    radiation only  . BREAST SURGERY  2009   X 2, cancer stage 0, lumpectomy  . [place]  . CHOLECYSTECTOMY    . DILATATION & CURETTAGE/HYSTEROSCOPY WITH MYOSURE  11/2015  . HERNIA REPAIR  2009  . JOINT REPLACEMENT  2008   hip   Family History  Problem Relation Age of Onset  . Arthritis Other   . Hypertension Other   . Cancer Other        2 aunts  . Hyperlipidemia Father   . Dementia Father   . Breast cancer Neg Hx    Social History   Socioeconomic History  . Marital status: Married    Spouse name: Not on file  . Number of children: 2  . Years of education: Not on file  . Highest education level: Not on file  Occupational History  . Occupation: Cabin crew    CommentAssociate Professor  . 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 Financial resource strain: Not hard at all  . Food insecurity:    Worry: Never true    Inability: Never true  . Transportation needs:    Medical: No    Non-medical: No  Tobacco Use  . Smoking status: Never Smoker  . Smokeless tobacco: Never Used  . Tobacco comment: father smoked when she was younger   Substance and Sexual Activity  . Alcohol use: Yes    Comment: occasionally   . Drug use: No  . Sexual activity: Not on file  Lifestyle  . Physical activity:    Days per week: 2 days    Minutes per session: 60 min  . Stress: Only a little  Relationships  . Social connections:    Talks on phone: More than three times a week    Gets together: More than three times a week    Attends religious service: Never    Active member of club or organization: No     Attends meetings of clubs or organizations: Never    Relationship status: Married  Other Topics Concern  . Not on file  Social History Narrative   Lives with husband on 2 level house, has two sons, one local with grandkids   Works FT as Cabin crew; Psychiatrist   Enjoys working out at gym about 2X/week, attends silver sneakers classes   Dad struggles with dementia, mother still living who is caretaker; occasionally travels to visit to assist mother.    Outpatient Encounter Medications as of 12/31/2018  Medication Sig  . ALPRAZolam (XANAX) 0.25 MG tablet TAKE 1/2 TABLET AT BEDTIME AS NEEDED FOR SLEEP  . amLODipine (NORVASC) 5 MG tablet TAKE 1 TABLET BY MOUTH EVERY DAY  . aspirin 81 MG tablet Take 81 mg by mouth daily.    . Calcium Carbonate-Vitamin D 600-200 MG-UNIT TABS Take 1 tablet by mouth daily.  Marland Kitchen docusate sodium (COLACE) 100 MG capsule Stool Softener  . EPIPEN 2-PAK 0.3 MG/0.3ML SOAJ injection   . famotidine (PEPCID) 10 MG tablet Take 10 mg 2 (two) times daily by mouth.  . levothyroxine (SYNTHROID, LEVOTHROID) 50 MCG tablet TAKE 1 TABLET BY MOUTH EVERY DAY  . meloxicam (MOBIC) 15 MG tablet TAKE 1 TABLET BY MOUTH EVERY DAY  . tretinoin (RETIN-A) 0.025 % cream Apply topically at bedtime.  . [DISCONTINUED] Calcium Carbonate-Vit D-Min (CALCIUM 1200 PO) Take by mouth.  . citalopram (CELEXA) 20 MG tablet TAKE 1 TABLET (20 MG TOTAL) BY MOUTH DAILY. (Patient taking differently: Take 20 mg by mouth daily. )  . Sennosides-Docusate Sodium (STOOL SOFTENER LAXATIVE PO) Take by mouth.  . [DISCONTINUED] Azelastine HCl 0.15 % SOLN Place 1 spray into both nostrils daily as needed.  . [DISCONTINUED] Cholecalciferol (VITAMIN D PO) Take by mouth.  . [DISCONTINUED] HYDROcodone-acetaminophen (NORCO) 5-325 MG tablet Take 1 tablet by mouth every 6 (six) hours as needed for moderate pain.   No facility-administered encounter medications on file as of 12/31/2018.     Activities of Daily Living In your present  state of health, do you have any difficulty performing the following activities: 12/31/2018  Hearing? N  Vision? N  Difficulty concentrating or making decisions? N  Walking or climbing stairs? N  Dressing or bathing? N  Doing errands, shopping? N  Preparing Food and eating ? N  Using the Toilet? N  In the past six months, have you accidently leaked urine? N  Do you have problems with loss of bowel control? N  Managing your Medications? N  Managing your Finances? N  Housekeeping or managing your Housekeeping? N  Some recent data might be hidden    Patient Care Team: [name], MD as PCP - General [name], [name], MD as Consulting Physician (Allergy and Immunology) [name], OD as Referring Physician    Assessment:   This is a routine wellness examination for Theresa Bradley. Physical assessment deferred to PCP.   Exercise Activities and Dietary recommendations Current Exercise Habits: Structured exercise class, Type of exercise: walking;calisthenics;strength training/weights(aerobics), Time (Minutes): 60, Frequency (Times/Week): 2, Weekly Exercise (Minutes/Week): 120, Intensity: Moderate, Exercise limited by: cardiac condition(s) Diet (meal preparation, eat out, water intake, caffeinated beverages, dairy products, fruits and vegetables): in general, a "healthy" diet   States she thinks she drinks enough water daily, further hydration encouraged as pt. Has been increasing demands of her body in aerobics recently. Drinks one cup of coffee daily. "I like my sweets". States she knows she should limit these, and desires to reduce weight by 5-10 pounds, "just to help my joints if anything". Pt. Concerned about new/worsening genrealized joint pain. PCP to be made aware. Pt. Declined follow-up OV to address at this time.        Goals    . Patient Stated     Lose weight to get to 155lb to reduce joint pain, get rid of belly fat    . Weight (lb) < 150 lb (68 kg)     Portion control  and eating less sweets Focus on good nutrition  Leans and green  Watch sodium          Fall Risk Fall Risk  12/31/2018 12/29/2017 12/02/2016 11/06/2015 09/29/2014  Falls in the past year? 0 No No Yes No  Comment - dislocated hip from turing in chair but no fall  - - -  Number falls in past yr: - - - 1 -  Follow up - - - Follow up appointment -     Depression Screen PHQ 2/9 Scores 12/31/2018 12/02/2016 11/06/2015 09/29/2014  PHQ - 2 Score 0 0 0 0  PHQ- 9 Score 1 - - -     Cognitive Function MMSE - Mini Mental State Exam 12/29/2017  Not completed: (No Data)       Ad8 score reviewed for issues:  Issues making decisions: no  Less interest in hobbies / activities: no  Repeats questions, stories (family complaining): no  Trouble using ordinary gadgets (microwave, computer, phone):no  Forgets the month or year: no  Mismanaging finances: no  Remembering appts: no  Daily problems with thinking and/or memory: no Ad8 score is= 0   Immunization History  Administered Date(s) Administered  . Influenza Split 09/08/2011, 08/20/2012, 09/26/2013  . Influenza Whole 09/11/2010  . Influenza, High Dose Seasonal PF 10/03/2015, 08/14/2016, 09/01/2017, 09/06/2018  . Influenza,inj,Quad PF,6+ Mos 09/01/2014  . Pneumococcal Conjugate-13 09/29/2014  . Pneumococcal Polysaccharide-23 11/24/2006, 11/06/2015  . Td 11/25/1999, 06/10/2010  . Zoster 07/09/2010  . Zoster Recombinat (Shingrix) 06/05/2017, 10/24/2017    Qualifies for Shingles Vaccine? Complaint, received full series in 2018  Screening Tests Health Maintenance  Topic Date Due  . COLONOSCOPY  04/23/2019  . TETANUS/TDAP  06/10/2020  . MAMMOGRAM  12/06/2020  . INFLUENZA VACCINE  Completed  . DEXA SCAN  Completed  . PNA vac Low Risk Adult  Completed  . Hepatitis C Screening  Discontinued       Plan:    Bone density order placed. Cologuard order placed.  Will follow-up with Dr. Elease Hashimoto about podiatry referral, and concern  about joint pain.  Continue to stay hydrated (at least 32oz water daily), especially prior to and after aerobic exercise.  Bring a copy of your living will and/or healthcare power of attorney to your next office visit.  Great meeting you today! I have personally reviewed and noted the following in the patient's chart:   . Medical and social history . Use of alcohol, tobacco or illicit drugs  . Current medications and supplements . Functional ability and status . Nutritional status . Physical activity . Advanced directives . List of other physicians . Vitals . Screenings to include cognitive, depression, and falls . Referrals and appointments  In addition, I have reviewed and discussed with patient certain preventive protocols, quality metrics, and best practice recommendations. A written personalized care plan for preventive services as well as general preventive health recommendations were provided to patient.     Alphia Moh, RN  12/31/2018  I have reviewed the documentation for the AWV and [place] provided by the health coach and agree with their documentation. I was immediately available for any questions  [name] MD [facility]

## 2018-12-31 ENCOUNTER — Telehealth: Payer: Self-pay

## 2018-12-31 ENCOUNTER — Ambulatory Visit (INDEPENDENT_AMBULATORY_CARE_PROVIDER_SITE_OTHER): Payer: Medicare Other

## 2018-12-31 VITALS — BP 128/70 | HR 60 | Resp 14 | Ht 61.5 in | Wt 162.0 lb

## 2018-12-31 DIAGNOSIS — J3089 Other allergic rhinitis: Secondary | ICD-10-CM | POA: Diagnosis not present

## 2018-12-31 DIAGNOSIS — M1990 Unspecified osteoarthritis, unspecified site: Secondary | ICD-10-CM

## 2018-12-31 DIAGNOSIS — J301 Allergic rhinitis due to pollen: Secondary | ICD-10-CM | POA: Diagnosis not present

## 2018-12-31 DIAGNOSIS — Z13820 Encounter for screening for osteoporosis: Secondary | ICD-10-CM

## 2018-12-31 DIAGNOSIS — Z1211 Encounter for screening for malignant neoplasm of colon: Secondary | ICD-10-CM | POA: Diagnosis not present

## 2018-12-31 DIAGNOSIS — Z0000 Encounter for general adult medical examination without abnormal findings: Secondary | ICD-10-CM | POA: Diagnosis not present

## 2018-12-31 DIAGNOSIS — J3081 Allergic rhinitis due to animal (cat) (dog) hair and dander: Secondary | ICD-10-CM | POA: Diagnosis not present

## 2018-12-31 NOTE — Telephone Encounter (Signed)
During AWV, pt. expressed concern over increased generalized joint and muscle pain, as well as increased daytime fatigue X 2 months. Pt. stated she has been doing more aerobic activity in her silver sneakers class, but not sure if it is related. Author advised to increase hydration. Pt. declined to make OV with PCP when offered, but stated she would reach out to our office if she changed her mind.  Pt. also wanted PCP to be made aware of inflammed joint of L second toe that is new, as well as possible returning fungal growth to R great toe. Referral to podiatry offered, and pt. receptive. Referral order left pended for Dr. Erick Blinks review.

## 2018-12-31 NOTE — Patient Instructions (Addendum)
Bone density order placed. Cologuard order placed.  Will follow-up with Dr. Elease Hashimoto about podiatry referral, and concern about joint pain.  Continue to stay hydrated (at least 32oz water daily), especially prior to and after aerobic exercise.  Bring a copy of your living will and/or healthcare power of attorney to your next office visit.  Great meeting you today!     Theresa Bradley , Thank you for taking time to come for your Medicare Wellness Visit. I appreciate your ongoing commitment to your health goals. Please review the following plan we discussed and let me know if I can assist you in the future.   These are the goals we discussed: Goals    . Patient Stated     Lose weight to get to 155lb to reduce joint pain, get rid of belly fat    . Weight (lb) < 150 lb (68 kg)     Portion control and eating less sweets Focus on good nutrition  Leans and green  Watch sodium          This is a list of the screening recommended for you and due dates:  Health Maintenance  Topic Date Due  . Colon Cancer Screening  04/23/2019  . Tetanus Vaccine  06/10/2020  . Mammogram  12/06/2020  . Flu Shot  Completed  . DEXA scan (bone density measurement)  Completed  . Pneumonia vaccines  Completed  .  Hepatitis C: One time screening is recommended by Center for Disease Control  (CDC) for  adults born from [birth year] through 1967.   Discontinued    Joint Pain Joint pain (arthralgia) may be caused by many things. Joint pain is likely to go away when you follow instructions from your health care provider for relieving pain at home. However, joint pain can also be caused by conditions that require more treatment. Common causes of joint pain include:  Bruising in the area of the joint.  Injury caused by repeating certain movements too many times (overuse injury).  Age-related joint wear and tear (osteoarthritis).  Buildup of uric acid crystals in the joint (gout).  Inflammation of the joint  (rheumatic disease).  Various other forms of arthritis.  Infections of the joint (septic arthritis) or of the bone (osteomyelitis). Your health care provider may recommend that you take pain medicine or wear a supportive device like an elastic bandage, sling, or splint. If your joint pain continues, you may need lab or imaging tests to diagnose the cause of your joint pain. Follow these instructions at home: Managing pain, stiffness, and swelling   If directed, put ice on the painful area. Icing can help to relieve joint pain and swelling. ? Put ice in a plastic bag. ? Place a towel between your skin and the bag. ? Leave the ice on for 20 minutes, 2-3 times a day.  If directed, apply heat to the painful area as often as told by your health care provider. Heat can reduce the stiffness of your muscles and joints. Use the heat source that your health care provider recommends, such as a moist heat pack or a heating pad. ? Place a towel between your skin and the heat source. ? Leave the heat on for 20-30 minutes. ? Remove the heat if your skin turns bright red. This is especially important if you are unable to feel pain, heat, or cold. You may have a greater risk of getting burned.  Move your fingers or toes below the painful joint often. You  can avoid stiffness and lessen swelling by doing this.  If possible, raise (elevate) the painful joint above the level of your heart while you are sitting or lying down. To do this, try putting a few pillows under the painful joint. Activity  Rest the painful joint for as long as directed. Do not do anything that causes or worsens pain.  Begin exercising or stretching the affected area, as told by your health care provider. Ask your health care provider what types of exercise are safe for you. If you have an elastic bandage, sling, or splint:  Wear the supportive device as told by your health care provider. Remove it only as told by your health care  provider.  Loosen the device if your fingers or toes below the joint tingle, become numb, or turn cold and blue.  Keep the device clean.  Ask your health care provider if you should remove the device before bathing. You may need to cover it with a watertight covering when you take a bath or a shower. General instructions  Take over-the-counter and prescription medicines only as told by your health care provider.  Do not use any products that contain nicotine or tobacco, such as cigarettes and e-cigarettes. If you need help quitting, ask your health care provider.  Keep all follow-up visits as told by your health care provider. This is important. Contact a health care provider if:  You have pain that gets worse and does not get better with medicine.  Your joint pain does not improve within 3 days.  You have increased bruising or swelling.  You have a fever.  You lose 10 lb (4.5 kg) or more without trying. Get help right away if:  You cannot move the joint.  Your fingers or toes tingle, become numb, or turn cold and blue.  You have a fever along with a joint that is red, warm, and swollen. Summary  Joint pain (arthralgia) may be caused by many things.  Your health care provider may recommend that you take pain medicine or wear a supportive device like an elastic bandage, sling, or splint.  If your joint pain continues, you may need tests to diagnose the cause of your joint pain.  Take over-the-counter and prescription medicines only as told by your health care provider. This information is not intended to replace advice given to you by your health care provider. Make sure you discuss any questions you have with your health care provider. Document Released: 11/10/2005 Document Revised: 08/26/2017 Document Reviewed: 08/26/2017 Elsevier Interactive Patient Education  2019 [employer].

## 2019-01-12 DIAGNOSIS — J3081 Allergic rhinitis due to animal (cat) (dog) hair and dander: Secondary | ICD-10-CM | POA: Diagnosis not present

## 2019-01-12 DIAGNOSIS — J301 Allergic rhinitis due to pollen: Secondary | ICD-10-CM | POA: Diagnosis not present

## 2019-01-12 DIAGNOSIS — J3089 Other allergic rhinitis: Secondary | ICD-10-CM | POA: Diagnosis not present

## 2019-01-14 DIAGNOSIS — J3081 Allergic rhinitis due to animal (cat) (dog) hair and dander: Secondary | ICD-10-CM | POA: Diagnosis not present

## 2019-01-14 NOTE — Telephone Encounter (Signed)
Podiatry referral placed. Author phoned pt. To notify, and offer OV to discuss energy/achiness. Pt. Stated she feels better now and does not need follow-up at this time.

## 2019-01-14 NOTE — Telephone Encounter (Signed)
Re-routed to PCP to approve podiatry referral and/or request OV with PCP first.

## 2019-01-14 NOTE — Telephone Encounter (Signed)
OK to put in podiatry referral.

## 2019-01-17 DIAGNOSIS — Z1211 Encounter for screening for malignant neoplasm of colon: Secondary | ICD-10-CM | POA: Diagnosis not present

## 2019-01-20 LAB — COLOGUARD: Cologuard: NEGATIVE

## 2019-01-21 ENCOUNTER — Encounter: Payer: Self-pay | Admitting: Family Medicine

## 2019-01-25 ENCOUNTER — Other Ambulatory Visit: Payer: Self-pay

## 2019-01-26 ENCOUNTER — Other Ambulatory Visit: Payer: Self-pay | Admitting: Podiatry

## 2019-01-26 ENCOUNTER — Encounter: Payer: Self-pay | Admitting: Podiatry

## 2019-01-26 ENCOUNTER — Ambulatory Visit (INDEPENDENT_AMBULATORY_CARE_PROVIDER_SITE_OTHER): Payer: Medicare Other

## 2019-01-26 ENCOUNTER — Ambulatory Visit: Payer: Medicare Other | Admitting: Podiatry

## 2019-01-26 VITALS — BP 149/77

## 2019-01-26 DIAGNOSIS — M79674 Pain in right toe(s): Secondary | ICD-10-CM

## 2019-01-26 DIAGNOSIS — J3089 Other allergic rhinitis: Secondary | ICD-10-CM | POA: Diagnosis not present

## 2019-01-26 DIAGNOSIS — M779 Enthesopathy, unspecified: Secondary | ICD-10-CM

## 2019-01-26 DIAGNOSIS — J3081 Allergic rhinitis due to animal (cat) (dog) hair and dander: Secondary | ICD-10-CM | POA: Diagnosis not present

## 2019-01-26 DIAGNOSIS — M79675 Pain in left toe(s): Secondary | ICD-10-CM

## 2019-01-26 DIAGNOSIS — B351 Tinea unguium: Secondary | ICD-10-CM

## 2019-01-26 DIAGNOSIS — J301 Allergic rhinitis due to pollen: Secondary | ICD-10-CM | POA: Diagnosis not present

## 2019-01-26 MED ORDER — TERBINAFINE HCL 250 MG PO TABS: ORAL_TABLET | ORAL | 0 refills | Status: DC

## 2019-01-26 NOTE — Progress Notes (Signed)
Subjective:   Patient ID: Theresa Bradley, female   DOB: 68 y.o.   MRN: [id number]   HPI Patient presents concerned about enlargement of the fourth digit right with occasional discomfort and on the left there is discoloration of the lateral side of the nailbed distal two thirds involvement of several years duration.  Patient does not smoke likes to be active   Review of Systems  All other systems reviewed and are negative.       Objective:  Physical Exam Vitals signs and nursing note reviewed.  Constitutional:      Appearance: She is well-developed.  Pulmonary:     Effort: Pulmonary effort is normal.  Musculoskeletal: Normal range of motion.  Skin:    General: Skin is warm.  Neurological:     Mental Status: She is alert.     Neurovascular status intact muscle strength is adequate range of motion within normal limits with patient found to have enlargement of the distal interphalangeal joint digit for right and discoloration of the left hallux nail distal two thirds lateral side with slight involvement of the fifth nail right over left.  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  For the left we discussed different treatment options and she would like to be aggressive and I have recommended a pulse Lamisil therapy along with laser therapy and topical medication.  Patient scheduled for laser currently  X-rays indicate there is enlargement of the joint on the fourth digit right over left with no indications of fracture of the toe

## 2019-02-09 DIAGNOSIS — J301 Allergic rhinitis due to pollen: Secondary | ICD-10-CM | POA: Diagnosis not present

## 2019-02-09 DIAGNOSIS — J3081 Allergic rhinitis due to animal (cat) (dog) hair and dander: Secondary | ICD-10-CM | POA: Diagnosis not present

## 2019-02-09 DIAGNOSIS — J3089 Other allergic rhinitis: Secondary | ICD-10-CM | POA: Diagnosis not present

## 2019-02-10 ENCOUNTER — Other Ambulatory Visit: Payer: Medicare Other

## 2019-02-23 ENCOUNTER — Encounter: Payer: Self-pay | Admitting: Family Medicine

## 2019-02-23 DIAGNOSIS — J301 Allergic rhinitis due to pollen: Secondary | ICD-10-CM | POA: Diagnosis not present

## 2019-02-23 DIAGNOSIS — J3089 Other allergic rhinitis: Secondary | ICD-10-CM | POA: Diagnosis not present

## 2019-02-23 DIAGNOSIS — J3081 Allergic rhinitis due to animal (cat) (dog) hair and dander: Secondary | ICD-10-CM | POA: Diagnosis not present

## 2019-02-26 ENCOUNTER — Other Ambulatory Visit: Payer: Self-pay | Admitting: Family Medicine

## 2019-02-27 ENCOUNTER — Other Ambulatory Visit: Payer: Self-pay | Admitting: Family Medicine

## 2019-03-03 ENCOUNTER — Encounter: Payer: Self-pay | Admitting: Family Medicine

## 2019-03-09 ENCOUNTER — Other Ambulatory Visit: Payer: Medicare Other

## 2019-03-09 DIAGNOSIS — J3089 Other allergic rhinitis: Secondary | ICD-10-CM | POA: Diagnosis not present

## 2019-03-09 DIAGNOSIS — J3081 Allergic rhinitis due to animal (cat) (dog) hair and dander: Secondary | ICD-10-CM | POA: Diagnosis not present

## 2019-03-09 DIAGNOSIS — J301 Allergic rhinitis due to pollen: Secondary | ICD-10-CM | POA: Diagnosis not present

## 2019-03-11 DIAGNOSIS — J3081 Allergic rhinitis due to animal (cat) (dog) hair and dander: Secondary | ICD-10-CM | POA: Diagnosis not present

## 2019-03-15 DIAGNOSIS — K210 Gastro-esophageal reflux disease with esophagitis: Secondary | ICD-10-CM | POA: Diagnosis not present

## 2019-03-15 DIAGNOSIS — J3089 Other allergic rhinitis: Secondary | ICD-10-CM | POA: Diagnosis not present

## 2019-03-15 DIAGNOSIS — J301 Allergic rhinitis due to pollen: Secondary | ICD-10-CM | POA: Diagnosis not present

## 2019-03-15 DIAGNOSIS — J3081 Allergic rhinitis due to animal (cat) (dog) hair and dander: Secondary | ICD-10-CM | POA: Diagnosis not present

## 2019-03-16 ENCOUNTER — Other Ambulatory Visit: Payer: Self-pay | Admitting: Family Medicine

## 2019-03-18 DIAGNOSIS — J3081 Allergic rhinitis due to animal (cat) (dog) hair and dander: Secondary | ICD-10-CM | POA: Diagnosis not present

## 2019-03-21 ENCOUNTER — Encounter: Payer: Self-pay | Admitting: Family Medicine

## 2019-03-22 DIAGNOSIS — J3081 Allergic rhinitis due to animal (cat) (dog) hair and dander: Secondary | ICD-10-CM | POA: Diagnosis not present

## 2019-03-22 DIAGNOSIS — J301 Allergic rhinitis due to pollen: Secondary | ICD-10-CM | POA: Diagnosis not present

## 2019-03-22 DIAGNOSIS — J3089 Other allergic rhinitis: Secondary | ICD-10-CM | POA: Diagnosis not present

## 2019-04-06 DIAGNOSIS — J3081 Allergic rhinitis due to animal (cat) (dog) hair and dander: Secondary | ICD-10-CM | POA: Diagnosis not present

## 2019-04-06 DIAGNOSIS — J3089 Other allergic rhinitis: Secondary | ICD-10-CM | POA: Diagnosis not present

## 2019-04-06 DIAGNOSIS — J301 Allergic rhinitis due to pollen: Secondary | ICD-10-CM | POA: Diagnosis not present

## 2019-04-20 DIAGNOSIS — J3081 Allergic rhinitis due to animal (cat) (dog) hair and dander: Secondary | ICD-10-CM | POA: Diagnosis not present

## 2019-04-20 DIAGNOSIS — J301 Allergic rhinitis due to pollen: Secondary | ICD-10-CM | POA: Diagnosis not present

## 2019-04-20 DIAGNOSIS — J3089 Other allergic rhinitis: Secondary | ICD-10-CM | POA: Diagnosis not present

## 2019-04-22 ENCOUNTER — Ambulatory Visit: Payer: Self-pay

## 2019-04-22 ENCOUNTER — Other Ambulatory Visit: Payer: Self-pay

## 2019-04-22 DIAGNOSIS — B351 Tinea unguium: Secondary | ICD-10-CM

## 2019-04-22 NOTE — Patient Instructions (Signed)

## 2019-04-22 NOTE — Progress Notes (Signed)
Pt presents with mycotic infection of nails 1-5 bilateral.  All other systems are negative  Laser therapy administered to affected nails and tolerated well. All safety precautions were in place.  2nd treatment.  Follow up in 4 weeks     

## 2019-04-26 DIAGNOSIS — J3081 Allergic rhinitis due to animal (cat) (dog) hair and dander: Secondary | ICD-10-CM | POA: Diagnosis not present

## 2019-04-26 DIAGNOSIS — J301 Allergic rhinitis due to pollen: Secondary | ICD-10-CM | POA: Diagnosis not present

## 2019-04-26 DIAGNOSIS — M25562 Pain in left knee: Secondary | ICD-10-CM | POA: Diagnosis not present

## 2019-04-26 DIAGNOSIS — J3089 Other allergic rhinitis: Secondary | ICD-10-CM | POA: Diagnosis not present

## 2019-04-26 DIAGNOSIS — M25552 Pain in left hip: Secondary | ICD-10-CM | POA: Diagnosis not present

## 2019-04-28 DIAGNOSIS — J3081 Allergic rhinitis due to animal (cat) (dog) hair and dander: Secondary | ICD-10-CM | POA: Diagnosis not present

## 2019-05-05 DIAGNOSIS — J301 Allergic rhinitis due to pollen: Secondary | ICD-10-CM | POA: Diagnosis not present

## 2019-05-05 DIAGNOSIS — J3081 Allergic rhinitis due to animal (cat) (dog) hair and dander: Secondary | ICD-10-CM | POA: Diagnosis not present

## 2019-05-05 DIAGNOSIS — J3089 Other allergic rhinitis: Secondary | ICD-10-CM | POA: Diagnosis not present

## 2019-05-11 DIAGNOSIS — J3081 Allergic rhinitis due to animal (cat) (dog) hair and dander: Secondary | ICD-10-CM | POA: Diagnosis not present

## 2019-05-17 DIAGNOSIS — J3089 Other allergic rhinitis: Secondary | ICD-10-CM | POA: Diagnosis not present

## 2019-05-17 DIAGNOSIS — J301 Allergic rhinitis due to pollen: Secondary | ICD-10-CM | POA: Diagnosis not present

## 2019-05-17 DIAGNOSIS — J3081 Allergic rhinitis due to animal (cat) (dog) hair and dander: Secondary | ICD-10-CM | POA: Diagnosis not present

## 2019-05-17 DIAGNOSIS — M7062 Trochanteric bursitis, left hip: Secondary | ICD-10-CM | POA: Diagnosis not present

## 2019-05-18 DIAGNOSIS — J3089 Other allergic rhinitis: Secondary | ICD-10-CM | POA: Diagnosis not present

## 2019-05-23 ENCOUNTER — Other Ambulatory Visit: Payer: Self-pay | Admitting: Family Medicine

## 2019-05-23 ENCOUNTER — Ambulatory Visit: Payer: Medicare Other

## 2019-05-24 ENCOUNTER — Other Ambulatory Visit: Payer: Self-pay

## 2019-05-24 ENCOUNTER — Ambulatory Visit (INDEPENDENT_AMBULATORY_CARE_PROVIDER_SITE_OTHER): Payer: Medicare Other | Admitting: Podiatry

## 2019-05-24 DIAGNOSIS — B351 Tinea unguium: Secondary | ICD-10-CM

## 2019-05-31 NOTE — Progress Notes (Signed)
Pt presents with mycotic infection of nails 1-5 bilateral.  All other systems are negative  Laser therapy administered to affected nails and tolerated well. All safety precautions were in place.  3rd treatment.  Follow up in 4 weeks     

## 2019-06-02 DIAGNOSIS — J3081 Allergic rhinitis due to animal (cat) (dog) hair and dander: Secondary | ICD-10-CM | POA: Diagnosis not present

## 2019-06-02 DIAGNOSIS — J301 Allergic rhinitis due to pollen: Secondary | ICD-10-CM | POA: Diagnosis not present

## 2019-06-02 DIAGNOSIS — M1612 Unilateral primary osteoarthritis, left hip: Secondary | ICD-10-CM | POA: Diagnosis not present

## 2019-06-02 DIAGNOSIS — J3089 Other allergic rhinitis: Secondary | ICD-10-CM | POA: Diagnosis not present

## 2019-06-13 ENCOUNTER — Other Ambulatory Visit: Payer: Self-pay

## 2019-06-13 ENCOUNTER — Other Ambulatory Visit: Payer: Self-pay | Admitting: Family Medicine

## 2019-06-13 ENCOUNTER — Ambulatory Visit (INDEPENDENT_AMBULATORY_CARE_PROVIDER_SITE_OTHER): Payer: Medicare Other | Admitting: Family Medicine

## 2019-06-13 DIAGNOSIS — J3089 Other allergic rhinitis: Secondary | ICD-10-CM | POA: Diagnosis not present

## 2019-06-13 DIAGNOSIS — J3081 Allergic rhinitis due to animal (cat) (dog) hair and dander: Secondary | ICD-10-CM | POA: Diagnosis not present

## 2019-06-13 DIAGNOSIS — J301 Allergic rhinitis due to pollen: Secondary | ICD-10-CM | POA: Diagnosis not present

## 2019-06-13 DIAGNOSIS — I10 Essential (primary) hypertension: Secondary | ICD-10-CM

## 2019-06-13 MED ORDER — AMLODIPINE BESYLATE 5 MG PO TABS: 5.0000 mg | ORAL_TABLET | Freq: Every day | ORAL | 3 refills | Status: DC

## 2019-06-13 NOTE — Progress Notes (Signed)
Patient ID: Theresa Bradley, female   DOB: 06/11/1951, 68 y.o.   MRN: [id number]  This visit type was conducted due to national recommendations for restrictions regarding the COVID-19 pandemic in an effort to limit this patient's exposure and mitigate transmission in our community.   Virtual Visit via Video Note  I connected with Harlon Flor on 06/13/19 at  2:15 PM EDT by a video enabled telemedicine application and verified that I am speaking with the correct person using two identifiers.  Location patient: home Location provider:work or home office Persons participating in the virtual visit: patient, provider  I discussed the limitations of evaluation and management by telemedicine and the availability of in person appointments. The patient expressed understanding and agreed to proceed.   HPI: Patient needs refills of amlodipine.  Blood pressure earlier today 133/77.  Compliant with medication.  No side effects.  She also takes levothyroxine.  She is due for follow-up labs but has physical scheduled for September and we agreed that her labs could wait until September.  She has had some recent hip problems and is seen orthopedist for that.  Otherwise doing fine.  No headaches.  No dizziness.   ROS: See pertinent positives and negatives per HPI.  Past Medical History:  Diagnosis Date  . Breast cancer ([place]) 2009   right  . Cancer ([place])   . COUGH, CHRONIC 05/10/2010  . HYPERTENSION 04/16/2009  . HYPOTHYROIDISM 04/16/2009  . OSTEOARTHRITIS 04/16/2009  . Personal history of radiation therapy   . SENILE LENTIGO 06/10/2010    Past Surgical History:  Procedure Laterality Date  . BREAST EXCISIONAL BIOPSY Right   . BREAST EXCISIONAL BIOPSY Left   . BREAST LUMPECTOMY Right    radiation only  . BREAST SURGERY  2009   X 2, cancer stage 0, lumpectomy  . [place]  . CHOLECYSTECTOMY    . DILATATION & CURETTAGE/HYSTEROSCOPY WITH MYOSURE  11/2015  . HERNIA REPAIR   2009  . JOINT REPLACEMENT  2008   hip    Family History  Problem Relation Age of Onset  . Arthritis Other   . Hypertension Other   . Cancer Other        2 aunts  . Hyperlipidemia Father   . Dementia Father   . Breast cancer Neg Hx     SOCIAL HX: Non-smoker.  Still working full-time   Current Outpatient Medications:  .  ALPRAZolam (XANAX) 0.25 MG tablet, TAKE 1/2 TABLET AT BEDTIME AS NEEDED FOR SLEEP, Disp: 45 tablet, Rfl: 0 .  amLODipine (NORVASC) 5 MG tablet, Take 1 tablet (5 mg total) by mouth daily., Disp: 90 tablet, Rfl: 3 .  aspirin 81 MG tablet, Take 81 mg by mouth daily.  , Disp: , Rfl:  .  Calcium Carbonate-Vitamin D 600-200 MG-UNIT TABS, Take 1 tablet by mouth daily., Disp: , Rfl:  .  citalopram (CELEXA) 20 MG tablet, TAKE 1 TABLET (20 MG TOTAL) BY MOUTH DAILY. (Patient taking differently: Take 20 mg by mouth daily. ), Disp: 90 tablet, Rfl: 1 .  docusate sodium (COLACE) 100 MG capsule, Stool Softener, Disp: , Rfl:  .  EPIPEN 2-PAK 0.3 MG/0.3ML SOAJ injection, , Disp: , Rfl:  .  famotidine (PEPCID) 10 MG tablet, Take 10 mg 2 (two) times daily by mouth., Disp: , Rfl:  .  levothyroxine (SYNTHROID, LEVOTHROID) 50 MCG tablet, TAKE 1 TABLET BY MOUTH EVERY DAY, Disp: 90 tablet, Rfl: 1 .  meloxicam (MOBIC) 15 MG  tablet, TAKE 1 TABLET BY MOUTH EVERY DAY, Disp: 90 tablet, Rfl: 0 .  Sennosides-Docusate Sodium (STOOL SOFTENER LAXATIVE PO), Take by mouth., Disp: , Rfl:  .  terbinafine (LAMISIL) 250 MG tablet, Please take one a day x 7days, repeat every 4 weeks x 4 months, Disp: 28 tablet, Rfl: 0 .  tretinoin (RETIN-A) 0.025 % cream, Apply topically at bedtime., Disp: 45 g, Rfl: 6 .  Zoster Vaccine Adjuvanted (SHINGRIX) injection, Shingrix (PF) 50 mcg/0.5 mL intramuscular suspension, kit, Disp: , Rfl:   EXAM:  VITALS per patient if applicable:  GENERAL: alert, oriented, appears well and in no acute distress  HEENT: atraumatic, conjunttiva clear, no obvious abnormalities on  inspection of external nose and ears  NECK: normal movements of the head and neck  LUNGS: on inspection no signs of respiratory distress, breathing rate appears normal, no obvious gross SOB, gasping or wheezing  CV: no obvious cyanosis  MS: moves all visible extremities without noticeable abnormality  PSYCH/NEURO: pleasant and cooperative, no obvious depression or anxiety, speech and thought processing grossly intact  ASSESSMENT AND PLAN:  Discussed the following assessment and plan:  Essential hypertension-controlled by home readings  -Refill amlodipine for 1 year -Patient has physical scheduled for September and will plan to get labs at that point     I discussed the assessment and treatment plan with the patient. The patient was provided an opportunity to ask questions and all were answered. The patient agreed with the plan and demonstrated an understanding of the instructions.   The patient was advised to call back or seek an in-person evaluation if the symptoms worsen or if the condition fails to improve as anticipated.   Carolann Littler, MD

## 2019-06-16 DIAGNOSIS — L821 Other seborrheic keratosis: Secondary | ICD-10-CM | POA: Diagnosis not present

## 2019-06-16 DIAGNOSIS — J3089 Other allergic rhinitis: Secondary | ICD-10-CM | POA: Diagnosis not present

## 2019-06-16 DIAGNOSIS — D0359 Melanoma in situ of other part of trunk: Secondary | ICD-10-CM | POA: Diagnosis not present

## 2019-06-16 DIAGNOSIS — D225 Melanocytic nevi of trunk: Secondary | ICD-10-CM | POA: Diagnosis not present

## 2019-06-16 DIAGNOSIS — D2271 Melanocytic nevi of right lower limb, including hip: Secondary | ICD-10-CM | POA: Diagnosis not present

## 2019-06-16 DIAGNOSIS — D2261 Melanocytic nevi of right upper limb, including shoulder: Secondary | ICD-10-CM | POA: Diagnosis not present

## 2019-06-17 DIAGNOSIS — Z96649 Presence of unspecified artificial hip joint: Secondary | ICD-10-CM | POA: Diagnosis not present

## 2019-06-21 ENCOUNTER — Other Ambulatory Visit: Payer: Self-pay

## 2019-06-21 DIAGNOSIS — J301 Allergic rhinitis due to pollen: Secondary | ICD-10-CM | POA: Diagnosis not present

## 2019-06-21 DIAGNOSIS — J3089 Other allergic rhinitis: Secondary | ICD-10-CM | POA: Diagnosis not present

## 2019-06-21 DIAGNOSIS — J3081 Allergic rhinitis due to animal (cat) (dog) hair and dander: Secondary | ICD-10-CM | POA: Diagnosis not present

## 2019-06-21 MED ORDER — MELOXICAM 15 MG PO TABS: 15.0000 mg | ORAL_TABLET | Freq: Every day | ORAL | 0 refills | Status: DC

## 2019-06-23 ENCOUNTER — Other Ambulatory Visit: Payer: Self-pay

## 2019-06-23 ENCOUNTER — Ambulatory Visit: Payer: Self-pay | Admitting: Podiatry

## 2019-06-23 DIAGNOSIS — J3089 Other allergic rhinitis: Secondary | ICD-10-CM | POA: Diagnosis not present

## 2019-06-23 DIAGNOSIS — J3081 Allergic rhinitis due to animal (cat) (dog) hair and dander: Secondary | ICD-10-CM | POA: Diagnosis not present

## 2019-06-23 DIAGNOSIS — J301 Allergic rhinitis due to pollen: Secondary | ICD-10-CM | POA: Diagnosis not present

## 2019-06-23 DIAGNOSIS — B351 Tinea unguium: Secondary | ICD-10-CM

## 2019-06-27 DIAGNOSIS — D0352 Melanoma in situ of breast (skin) (soft tissue): Secondary | ICD-10-CM | POA: Diagnosis not present

## 2019-06-27 DIAGNOSIS — L988 Other specified disorders of the skin and subcutaneous tissue: Secondary | ICD-10-CM | POA: Diagnosis not present

## 2019-06-29 ENCOUNTER — Other Ambulatory Visit: Payer: Self-pay

## 2019-06-29 ENCOUNTER — Encounter: Payer: Self-pay | Admitting: Family Medicine

## 2019-06-29 ENCOUNTER — Ambulatory Visit (INDEPENDENT_AMBULATORY_CARE_PROVIDER_SITE_OTHER): Payer: Medicare Other | Admitting: Family Medicine

## 2019-06-29 VITALS — BP 128/70 | HR 56 | Temp 98.1°F | Ht 64.5 in | Wt 167.2 lb

## 2019-06-29 DIAGNOSIS — J301 Allergic rhinitis due to pollen: Secondary | ICD-10-CM | POA: Diagnosis not present

## 2019-06-29 DIAGNOSIS — I10 Essential (primary) hypertension: Secondary | ICD-10-CM | POA: Diagnosis not present

## 2019-06-29 DIAGNOSIS — J3081 Allergic rhinitis due to animal (cat) (dog) hair and dander: Secondary | ICD-10-CM | POA: Diagnosis not present

## 2019-06-29 DIAGNOSIS — Z01818 Encounter for other preprocedural examination: Secondary | ICD-10-CM | POA: Diagnosis not present

## 2019-06-29 DIAGNOSIS — J3089 Other allergic rhinitis: Secondary | ICD-10-CM | POA: Diagnosis not present

## 2019-06-29 LAB — POCT URINALYSIS DIPSTICK
Bilirubin, UA: NEGATIVE
Blood, UA: NEGATIVE
Glucose, UA: NEGATIVE
Ketones, UA: NEGATIVE
Leukocytes, UA: NEGATIVE
Nitrite, UA: NEGATIVE
Odor: NEGATIVE
Protein, UA: NEGATIVE
Spec Grav, UA: 1.01 (ref 1.010–1.025)
Urobilinogen, UA: 0.2 E.U./dL
pH, UA: 6 (ref 5.0–8.0)

## 2019-06-29 LAB — CBC WITH DIFFERENTIAL/PLATELET
Basophils Absolute: 0 10*3/uL (ref 0.0–0.1)
Basophils Relative: 0.6 % (ref 0.0–3.0)
Eosinophils Absolute: 0.3 10*3/uL (ref 0.0–0.7)
Eosinophils Relative: 3.7 % (ref 0.0–5.0)
HCT: 40.6 % (ref 36.0–46.0)
Hemoglobin: 13.6 g/dL (ref 12.0–15.0)
Lymphocytes Relative: 29.8 % (ref 12.0–46.0)
Lymphs Abs: 2.1 10*3/uL (ref 0.7–4.0)
MCHC: 33.5 g/dL (ref 30.0–36.0)
MCV: 89.9 fl (ref 78.0–100.0)
Monocytes Absolute: 0.7 10*3/uL (ref 0.1–1.0)
Monocytes Relative: 9.4 % (ref 3.0–12.0)
Neutro Abs: 4 10*3/uL (ref 1.4–7.7)
Neutrophils Relative %: 56.5 % (ref 43.0–77.0)
Platelets: 366 10*3/uL (ref 150.0–400.0)
RBC: 4.51 Mil/uL (ref 3.87–5.11)
RDW: 13.4 % (ref 11.5–15.5)
WBC: 7.1 10*3/uL (ref 4.0–10.5)

## 2019-06-29 LAB — COMPREHENSIVE METABOLIC PANEL
ALT: 12 U/L (ref 0–35)
AST: 14 U/L (ref 0–37)
Albumin: 4.1 g/dL (ref 3.5–5.2)
Alkaline Phosphatase: 68 U/L (ref 39–117)
BUN: 17 mg/dL (ref 6–23)
CO2: 30 mEq/L (ref 19–32)
Calcium: 9.3 mg/dL (ref 8.4–10.5)
Chloride: 101 mEq/L (ref 96–112)
Creatinine, Ser: 0.72 mg/dL (ref 0.40–1.20)
GFR: 80.08 mL/min (ref >60.00)
Glucose, Bld: 87 mg/dL (ref 70–99)
Potassium: 3.3 mEq/L — ABNORMAL LOW (ref 3.5–5.1)
Sodium: 137 mEq/L (ref 135–145)
Total Bilirubin: 0.4 mg/dL (ref 0.2–1.2)
Total Protein: 7.6 g/dL (ref 6.0–8.3)

## 2019-06-29 LAB — HEMOGLOBIN A1C: Hgb A1c MFr Bld: 5.7 % (ref 4.6–6.5)

## 2019-06-29 NOTE — Progress Notes (Signed)
Subjective:     Patient ID: Theresa Bradley, female   DOB: [birth year], 68 y.o.   MRN: [id number]  HPI Here for preoperative clearance for hip surgery.  She has had some problems with dislocation following prior THR.  She will be getting a left hip bearing surface revision with gluteal tendon repair  Her chronic problems include history of hypertension, GERD, hypothyroidism.  She has had some chronic intermittent cough for years.  Non-smoker.  No history of CAD.  No cerebrovascular disease.  She does take aspirin.  No anticoagulants.  Denies any recent chest pains or dyspnea.  No fever.  No history of diabetes  Past Medical History:  Diagnosis Date  . Breast cancer ([place]) 2009   right  . Cancer ([place])   . COUGH, CHRONIC 05/10/2010  . HYPERTENSION 04/16/2009  . HYPOTHYROIDISM 04/16/2009  . OSTEOARTHRITIS 04/16/2009  . Personal history of radiation therapy   . SENILE LENTIGO 06/10/2010   Past Surgical History:  Procedure Laterality Date  . BREAST EXCISIONAL BIOPSY Right   . BREAST EXCISIONAL BIOPSY Left   . BREAST LUMPECTOMY Right    radiation only  . BREAST SURGERY  2009   X 2, cancer stage 0, lumpectomy  . [place]  . CHOLECYSTECTOMY    . DILATATION & CURETTAGE/HYSTEROSCOPY WITH MYOSURE  11/2015  . HERNIA REPAIR  2009  . JOINT REPLACEMENT  2008   hip    reports that she has never smoked. She has never used smokeless tobacco. She reports current alcohol use. She reports that she does not use drugs. family history includes Arthritis in an other family member; Cancer in an other family member; Dementia in her father; Hyperlipidemia in her father; Hypertension in an other family member. Allergies  Allergen Reactions  . Codeine Sulfate Nausea And Vomiting  . Erythromycin Base Other (See Comments)    Stomach ache  . Esomeprazole Magnesium     REACTION: GI upset     Review of Systems  Constitutional: Negative for chills, fatigue and fever.  Eyes:  Negative for visual disturbance.  Respiratory: Negative for cough, chest tightness, shortness of breath and wheezing.   Cardiovascular: Negative for chest pain, palpitations and leg swelling.  Genitourinary: Negative for dysuria.  Neurological: Negative for dizziness, seizures, syncope, weakness, light-headedness and headaches.       Objective:   Physical Exam Constitutional:      Appearance: She is well-developed.  Eyes:     Pupils: Pupils are equal, round, and reactive to light.  Neck:     Musculoskeletal: Neck supple.     Thyroid: No thyromegaly.     Vascular: No carotid bruit or JVD.  Cardiovascular:     Rate and Rhythm: Normal rate and regular rhythm.     Heart sounds: No gallop.   Pulmonary:     Effort: Pulmonary effort is normal. No respiratory distress.     Breath sounds: Normal breath sounds. No wheezing or rales.  Musculoskeletal:     Right lower leg: No edema.     Left lower leg: No edema.  Lymphadenopathy:     Cervical: No cervical adenopathy.  Neurological:     Mental Status: She is alert.        Assessment:     -#1 Preoperative clearance.  Patient has EKG which shows sinus rhythm with rate of 53.  Nonspecific T wave changes.  No acute abnormalities otherwise.  #2 hypertension- stable     Plan:     -  Obtain labs with CBC, comprehensive metabolic panel, INR, X4C -EKG as above.  No concerning changes. -no contraindications for surgery. -stop aspirin 5 days prior to surgery.  [name] MD [facility]

## 2019-06-30 LAB — POCT INR: INR: 1.1 — AB (ref 2.0–3.0)

## 2019-06-30 NOTE — Addendum Note (Signed)
Addended by: Elmer Picker on: 06/30/2019 10:57 AM   Modules accepted: Orders

## 2019-07-06 NOTE — H&P (Signed)
TOTAL HIP REVISION ADMISSION H&P  Patient is admitted for left revision total hip arthroplasty.  Subjective:  Chief Complaint: left hip pain  HPI: Theresa Bradley, 68 y.o. 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 The indications for the revision total hip arthroplasty are gluteal tendon tear, recurrent dislocation, and possible bearing surface wear.  She initially had a left total hip arthroplasty in 2008 with Dr. Wynelle Link. She has subsequently had two hip dislocations over the years. She reports that she has also had bursitis over the years. This May, she began noticing worsening pain and weakness. MRI of the left hip was obtained, which demonstrates a large fluid collection around the hip. There is a tear of the gluteus medius tendon. This condition presents safety issues increasing the risk of falls.There is no current active infection.  Patient Active Problem List   Diagnosis Date Noted   Cough 05/31/2014   GERD (gastroesophageal reflux disease) 09/13/2012   HYPOTHYROIDISM 04/16/2009   Essential hypertension 04/16/2009   OSTEOARTHRITIS 04/16/2009   Past Medical History:  Diagnosis Date   Breast cancer ([place]) 2009   right   Cancer ([place])    COUGH, CHRONIC 05/10/2010   HYPERTENSION 04/16/2009   HYPOTHYROIDISM 04/16/2009   OSTEOARTHRITIS 04/16/2009   Personal history of radiation therapy    SENILE LENTIGO 06/10/2010    Past Surgical History:  Procedure Laterality Date   BREAST EXCISIONAL BIOPSY Right    BREAST EXCISIONAL BIOPSY Left    BREAST LUMPECTOMY Right    radiation only   BREAST SURGERY  2009   X 2, cancer stage 0, lumpectomy   CESAREAN SECTION     1979, [place]  11/2015   HERNIA REPAIR  2009   JOINT REPLACEMENT  2008   hip    No current facility-administered medications for this encounter.    Current Outpatient Medications  Medication Sig Dispense Refill Last Dose   ALPRAZolam (XANAX) 0.25 MG tablet TAKE 1/2 TABLET AT BEDTIME AS NEEDED FOR SLEEP 45 tablet 0 Taking   amLODipine (NORVASC) 5 MG tablet Take 1 tablet (5 mg total) by mouth daily. 90 tablet 3 Taking   aspirin 81 MG tablet Take 81 mg by mouth daily.     Taking   Calcium Carbonate-Vitamin D 600-200 MG-UNIT TABS Take 1 tablet by mouth daily.   Taking   citalopram (CELEXA) 20 MG tablet TAKE 1 TABLET (20 MG TOTAL) BY MOUTH DAILY. (Patient taking differently: Take 20 mg by mouth daily. ) 90 tablet 1 Taking   docusate sodium (COLACE) 100 MG capsule Stool Softener   Taking   EPIPEN 2-PAK 0.3 MG/0.3ML SOAJ injection    Taking   famotidine (PEPCID) 10 MG tablet Take 10 mg 2 (two) times daily by mouth.   Taking   levothyroxine (SYNTHROID, LEVOTHROID) 50 MCG tablet TAKE 1 TABLET BY MOUTH EVERY DAY 90 tablet 1 Taking   meloxicam (MOBIC) 15 MG tablet Take 1 tablet (15 mg total) by mouth daily. 90 tablet 0 Taking   Sennosides-Docusate Sodium (STOOL SOFTENER LAXATIVE PO) Take by mouth.   Taking   terbinafine (LAMISIL) 250 MG tablet Please take one a day x 7days, repeat every 4 weeks x 4 months (Patient not  taking: Reported on 06/29/2019) 28 tablet 0 Not Taking   tretinoin (RETIN-A) 0.025 % cream Apply topically at bedtime. 45 g 6 Taking   Zoster Vaccine Adjuvanted (SHINGRIX) injection Shingrix (PF) 50 mcg/0.5 mL intramuscular suspension, kit   Taking   Allergies  Allergen Reactions   Codeine Sulfate Nausea And Vomiting   Erythromycin Base Other (See Comments)    Stomach ache   Esomeprazole Magnesium     REACTION: GI upset    Social History   Tobacco Use   Smoking status: Never Smoker   Smokeless tobacco: Never Used   Tobacco  comment: father smoked when she was younger   Substance Use Topics   Alcohol use: Yes    Comment: occasionally     Family History  Problem Relation Age of Onset   Arthritis Other    Hypertension Other    Cancer Other        2 aunts   Hyperlipidemia Father    Dementia Father    Breast cancer Neg Hx      Review of Systems  Constitutional: Negative for chills and fever.  Respiratory: Negative for cough and shortness of breath.   Cardiovascular: Negative for chest pain and palpitations.  Gastrointestinal: Negative for nausea and vomiting.  Musculoskeletal: Positive for joint pain.  Neurological: Negative for tingling and sensory change.   Objective:  Physical Exam Patient is a 68 year old female.  Well nourished and well developed. Ambulating with a cane. General: Alert and oriented x3, cooperative and pleasant, no acute distress. Head: normocephalic, atraumatic, neck supple. Eyes: EOMI. Respiratory: breath sounds clear in all fields, no wheezing, rales, or rhonchi. Cardiovascular: Regular rate and rhythm, no murmurs, gallops or rubs. Abdomen: non-tender to palpation and soft, normoactive bowel sounds.  Musculoskeletal: Left Hip Exam: The range of motion: Flexion to 110 degrees, Internal Rotation to 20 degrees, External Rotation to 30 degrees, and abduction to 30 degrees with some discomfort. There is tenderness over the greater trochanter bursa. Calves soft and nontender. Motor function intact in LE. Strength 5/5 LE bilaterally.  Neuro: Distal pulses 2+. Sensation to light touch intact in LE. Vital signs in last 24 hours:   Labs:  Estimated body mass index is 28.26 kg/m as calculated from the following:   Height as of 06/29/19: 5' 4.5" (1.638 m).   Weight as of 06/29/19: 75.8 kg.  Imaging Review:  Plain radiographs demonstrate evidence of metal on metal hip replacement. No obvious abnormalities. MARS-MRI of the left hip demonstrates a large fluid collection  around the hip. There is a tear of the gluteus medius tendon.  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 Revision total hip arthroplasty and gluteal tendon repair is deemed medically necessary. The treatment options including medical management, injection therapy, arthroscopy and arthroplasty were discussed at length. The risks and benefits of total hip arthroplasty revision were presented and reviewed. The risks due to aseptic loosening, infection, stiffness, dislocation/subluxation,  thromboembolic complications and other imponderables were discussed.  The patient acknowledged the explanation, agreed to proceed with the plan and consent was signed. Patient is being admitted for inpatient treatment for surgery, pain control, PT, OT, prophylactic antibiotics, VTE prophylaxis, progressive ambulation and ADL's and discharge planning. The patient is planning to be discharged home.   Therapy Plans: HEP Disposition: Home with husband Planned  DVT Prophylaxis: Xarelto 21m daily (hx breast CA) DME needed: walker PCP: Dr. BElease HashimotoTXA: IV Allergies: codeine - nausea/vomiting Anesthesia Concerns: none BMI: 28.8  Other: Hx of breast cancer, s/p radiation treatment in 2009. 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   - Patient was instructed on what medications to stop prior to surgery. - Follow-up visit in 2 weeks with Dr. AWynelle Link- Begin physical therapy following surgery - Pre-operative lab work as pre-surgical testing - Prescriptions will be provided in hospital at time of discharge  AGriffith Citron PA-C Orthopedic Surgery [facility] [place]

## 2019-07-12 NOTE — Patient Instructions (Addendum)
YOU NEED TO HAVE A COVID 19 TEST ON__8/20_____ @_3 :05 pm______, THIS TEST MUST BE DONE BEFORE SURGERY, COME  [place] , [id number]. ONCE YOUR COVID TEST IS COMPLETED, PLEASE BEGIN THE QUARANTINE INSTRUCTIONS AS OUTLINED IN YOUR HANDOUT.                SERETHA ESTABROOKS    Your procedure is scheduled on: 07-18-2019   Report to [facility] Main  Entrance  Report to admitting at 10:05 AM   1 VISITOR IS ALLOWED TO WAIT IN WAITING ROOM  ONLY DAY OF YOUR SURGERY. NO VISITORS ARE ALLOWED IN SHORT STAY OR RECOVERY ROOM.   Call this number if you have problems the morning of surgery [phone]    Remember:.BRUSH YOUR TEETH MORNING OF SURGERY AND RINSE YOUR MOUTH OUT, NO CHEWING GUM CANDY OR MINTS.   NO SOLID FOOD AFTER MIDNIGHT THE NIGHT PRIOR TO SURGERY. NOTHING BY MOUTH EXCEPT CLEAR LIQUIDS UNTIL 9:35 AM . PLEASE FINISH ENSURE DRINK PER SURGEON ORDER  WHICH NEEDS TO BE COMPLETED AT 9:35 AM .   CLEAR LIQUID DIET   Foods Allowed                                                                     Foods Excluded  Coffee and tea, regular and decaf                             liquids that you cannot  Plain Jell-O any favor except red or purple                                           see through such as: Fruit ices (not with fruit pulp)                                     milk, soups, orange juice  Iced Popsicles                                    All solid food Carbonated beverages, regular and diet                                    Cranberry, grape and apple juices Sports drinks like Gatorade Lightly seasoned clear broth or consume(fat free) Sugar, honey syrup  Sample Menu Breakfast                                Lunch                                     Supper Cranberry juice  Beef broth                            Chicken broth Jell-O                                     Grape juice                           Apple juice Coffee or tea                         Jell-O                                      Popsicle                                                Coffee or tea                        Coffee or tea  _____________________________________________________________________     Take these medicines the morning of surgery with A SIP OF WATER: Amlodipine, Synthroid, pepcid, celexa.                                You may not have any metal on your body including hair pins and              piercings  Do not wear jewelry, make-up, lotions, powders or perfumes, deodorant             Do not wear nail polish.  Do not shave  48 hours prior to surgery.                 Do not bring valuables to the hospital. [place].  Contacts, dentures or bridgework may not be worn into surgery.      _____________________________________________________________________             [facility] - Preparing for Surgery Before surgery, you can play an important role .  Because skin is not sterile, your skin needs to be as free of germs as possible.  You can reduce the number of germs on your skin by washing with CHG (chlorahexidine gluconate) soap before surgery.  CHG is an antiseptic cleaner which kills germs and bonds with the skin to continue killing germs even after washing. Please DO NOT use if you have an allergy to CHG or antibacterial soaps.  If your skin becomes reddened/irritated stop using the CHG and inform your nurse when you arrive at Short Stay. Do not shave (including legs and underarms) for at least 48 hours prior to the first CHG shower.  Please follow these instructions carefully:  1.  Shower with CHG Soap the night before surgery and the  morning of Surgery.  2.  If you choose to wash your hair, wash your hair first as usual with your  normal  shampoo.  3.  After you shampoo, rinse your hair and body thoroughly to remove the  shampoo.                           4                          .  Use CHG as you would any other liquid soap.  You can apply chg directly  to the skin and wash                       Gently with a scrungie or clean washcloth.  5.  Apply the CHG Soap to your body ONLY FROM THE NECK DOWN.   Do not use on face/ open                           Wound or open sores. Avoid contact with eyes, ears mouth and genitals (private parts).                       Wash face,  Genitals (private parts) with your normal soap.             6.  Wash thoroughly, paying special attention to the area where your surgery  will be performed.  7.  Thoroughly rinse your body with warm water from the neck down.  8.  DO NOT shower/wash with your normal soap after using and rinsing off  the CHG Soap.                9.  Pat yourself dry with a clean towel.            10.  Wear clean pajamas.            11.  Place clean sheets on your bed the night of your first shower and do not  sleep with pets. Day of Surgery : Do not apply any lotions/deodorants the morning of surgery.  Please wear clean clothes to the hospital/surgery center.  FAILURE TO FOLLOW THESE INSTRUCTIONS MAY RESULT IN THE CANCELLATION OF YOUR SURGERY PATIENT SIGNATURE_________________________________  NURSE SIGNATURE__________________________________  ________________________________________________________________________   Adam Phenix  An incentive spirometer is a tool that can help keep your lungs clear and active. This tool measures how well you are filling your lungs with each breath. Taking long deep breaths may help reverse or decrease the chance of developing breathing (pulmonary) problems (especially infection) following:  A long period of time when you are unable to move or be active. BEFORE THE PROCEDURE   If the spirometer includes an indicator to show your best effort, your nurse or respiratory therapist will set it to a desired goal.  If possible, sit up straight or lean slightly forward.  Try not to slouch.  Hold the incentive spirometer in an upright position. INSTRUCTIONS FOR USE  1. Sit on the edge of your bed if possible, or sit up as far as you can in bed or on a chair. 2. Hold the incentive spirometer in an upright position. 3. Breathe out normally. 4. Place the mouthpiece in your mouth and seal your lips tightly around it. 5. Breathe in slowly and as deeply as possible, raising the piston or the ball toward the top of the column. 6. Hold your breath for 3-5 seconds or for as long  as possible. Allow the piston or ball to fall to the bottom of the column. 7. Remove the mouthpiece from your mouth and breathe out normally. 8. Rest for a few seconds and repeat Steps 1 through 7 at least 10 times every 1-2 hours when you are awake. Take your time and take a few normal breaths between deep breaths. 9. The spirometer may include an indicator to show your best effort. Use the indicator as a goal to work toward during each repetition. 10. After each set of 10 deep breaths, practice coughing to be sure your lungs are clear. If you have an incision (the cut made at the time of surgery), support your incision when coughing by placing a pillow or rolled up towels firmly against it. Once you are able to get out of bed, walk around indoors and cough well. You may stop using the incentive spirometer when instructed by your caregiver.  RISKS AND COMPLICATIONS  Take your time so you do not get dizzy or light-headed.  If you are in pain, you may need to take or ask for pain medication before doing incentive spirometry. It is harder to take a deep breath if you are having pain. AFTER USE  Rest and breathe slowly and easily.  It can be helpful to keep track of a log of your progress. Your caregiver can provide you with a simple table to help with this. If you are using the spirometer at home, follow these instructions: [place] IF:   You are having difficultly using the  spirometer.  You have trouble using the spirometer as often as instructed.  Your pain medication is not giving enough relief while using the spirometer.  You develop fever of 100.5 F (38.1 C) or higher. SEEK IMMEDIATE MEDICAL CARE IF:   You cough up bloody sputum that had not been present before.  You develop fever of 102 F (38.9 C) or greater.  You develop worsening pain at or near the incision site. MAKE SURE YOU:   Understand these instructions.  Will watch your condition.  Will get help right away if you are not doing well or get worse. Document Released: 03/23/2007 Document Revised: 02/02/2012 Document Reviewed: 05/24/2007 [facility] Patient Information 2014 [place], [place].   ________________________________________________________________________

## 2019-07-12 NOTE — Progress Notes (Addendum)
MEDICAL CLEARANCE NOTE AND NOTE TO STOP ASPIRIN X 5 DAYS BEFORE SURGERY DR Elease Hashimoto 06-30-19 Epic AND ON CHART HEMAGLOBIN A1C 06-29-19 Epic AND ON CHART EKG 06-29-19 [facility]

## 2019-07-13 ENCOUNTER — Other Ambulatory Visit: Payer: Self-pay

## 2019-07-13 ENCOUNTER — Encounter (HOSPITAL_COMMUNITY): Payer: Self-pay

## 2019-07-13 ENCOUNTER — Encounter (HOSPITAL_COMMUNITY)
Admission: RE | Admit: 2019-07-13 | Discharge: 2019-07-13 | Disposition: A | Discharge status: Home / Self Care | Payer: Medicare Other | Source: Ambulatory Visit | Attending: Orthopedic Surgery | Admitting: Orthopedic Surgery

## 2019-07-13 DIAGNOSIS — Z79890 Hormone replacement therapy (postmenopausal): Secondary | ICD-10-CM | POA: Diagnosis not present

## 2019-07-13 DIAGNOSIS — Z01812 Encounter for preprocedural laboratory examination: Secondary | ICD-10-CM | POA: Diagnosis not present

## 2019-07-13 DIAGNOSIS — Y831 Surgical operation with implant of artificial internal device as the cause of abnormal reaction of the patient, or of later complication, without mention of misadventure at the time of the procedure: Secondary | ICD-10-CM | POA: Diagnosis not present

## 2019-07-13 DIAGNOSIS — J3081 Allergic rhinitis due to animal (cat) (dog) hair and dander: Secondary | ICD-10-CM | POA: Diagnosis not present

## 2019-07-13 DIAGNOSIS — S76012A Strain of muscle, fascia and tendon of left hip, initial encounter: Secondary | ICD-10-CM | POA: Diagnosis not present

## 2019-07-13 DIAGNOSIS — Z791 Long term (current) use of non-steroidal anti-inflammatories (NSAID): Secondary | ICD-10-CM | POA: Diagnosis not present

## 2019-07-13 DIAGNOSIS — T84091A Other mechanical complication of internal left hip prosthesis, initial encounter: Secondary | ICD-10-CM | POA: Diagnosis not present

## 2019-07-13 DIAGNOSIS — I10 Essential (primary) hypertension: Secondary | ICD-10-CM | POA: Diagnosis not present

## 2019-07-13 DIAGNOSIS — Z96642 Presence of left artificial hip joint: Secondary | ICD-10-CM | POA: Diagnosis not present

## 2019-07-13 DIAGNOSIS — Z20828 Contact with and (suspected) exposure to other viral communicable diseases: Secondary | ICD-10-CM | POA: Insufficient documentation

## 2019-07-13 DIAGNOSIS — K219 Gastro-esophageal reflux disease without esophagitis: Secondary | ICD-10-CM | POA: Diagnosis not present

## 2019-07-13 DIAGNOSIS — Z923 Personal history of irradiation: Secondary | ICD-10-CM | POA: Diagnosis not present

## 2019-07-13 DIAGNOSIS — Z853 Personal history of malignant neoplasm of breast: Secondary | ICD-10-CM | POA: Diagnosis not present

## 2019-07-13 DIAGNOSIS — Z7982 Long term (current) use of aspirin: Secondary | ICD-10-CM | POA: Diagnosis not present

## 2019-07-13 DIAGNOSIS — J301 Allergic rhinitis due to pollen: Secondary | ICD-10-CM | POA: Diagnosis not present

## 2019-07-13 DIAGNOSIS — Z79899 Other long term (current) drug therapy: Secondary | ICD-10-CM | POA: Diagnosis not present

## 2019-07-13 DIAGNOSIS — E039 Hypothyroidism, unspecified: Secondary | ICD-10-CM | POA: Diagnosis not present

## 2019-07-13 DIAGNOSIS — J3089 Other allergic rhinitis: Secondary | ICD-10-CM | POA: Diagnosis not present

## 2019-07-13 HISTORY — DX: Anxiety disorder, unspecified: F41.9

## 2019-07-13 HISTORY — DX: Cardiac arrhythmia, unspecified: I49.9

## 2019-07-13 LAB — CBC
HCT: 42.7 % (ref 36.0–46.0)
Hemoglobin: 13.8 g/dL (ref 12.0–15.0)
MCH: 29.7 pg (ref 26.0–34.0)
MCHC: 32.3 g/dL (ref 30.0–36.0)
MCV: 91.8 fL (ref 80.0–100.0)
Platelets: 375 10*3/uL (ref 150–400)
RBC: 4.65 MIL/uL (ref 3.87–5.11)
RDW: 13.1 % (ref 11.5–15.5)
WBC: 5.4 10*3/uL (ref 4.0–10.5)
nRBC: 0 % (ref 0.0–0.2)

## 2019-07-13 LAB — BASIC METABOLIC PANEL
Anion gap: 12 (ref 5–15)
BUN: 12 mg/dL (ref 8–23)
CO2: 25 mmol/L (ref 22–32)
Calcium: 9 mg/dL (ref 8.9–10.3)
Chloride: 103 mmol/L (ref 98–111)
Creatinine, Ser: 0.7 mg/dL (ref 0.44–1.00)
GFR calc Af Amer: >60 mL/min (ref >60)
GFR calc non Af Amer: >60 mL/min (ref >60)
Glucose, Bld: 93 mg/dL (ref 70–99)
Potassium: 3.5 mmol/L (ref 3.5–5.1)
Sodium: 140 mmol/L (ref 135–145)

## 2019-07-13 LAB — SURGICAL PCR SCREEN
MRSA, PCR: NEGATIVE
Staphylococcus aureus: NEGATIVE

## 2019-07-13 MED ORDER — ENSURE PRE-SURGERY PO LIQD
296.0000 mL | Freq: Once | ORAL | Status: DC
  Filled 2019-07-13: qty 296

## 2019-07-13 NOTE — Progress Notes (Signed)
Patient states that she stopped taking ASA, Mobic and suppiments on 8/19. Dr. Anne Fu office gave her those instructions.

## 2019-07-14 ENCOUNTER — Other Ambulatory Visit (HOSPITAL_COMMUNITY)
Admission: RE | Admit: 2019-07-14 | Discharge: 2019-07-14 | Disposition: A | Discharge status: Home / Self Care | Payer: Medicare Other | Source: Ambulatory Visit | Attending: Orthopedic Surgery | Admitting: Orthopedic Surgery

## 2019-07-14 DIAGNOSIS — Z01812 Encounter for preprocedural laboratory examination: Secondary | ICD-10-CM | POA: Diagnosis not present

## 2019-07-14 DIAGNOSIS — S76012A Strain of muscle, fascia and tendon of left hip, initial encounter: Secondary | ICD-10-CM | POA: Diagnosis not present

## 2019-07-15 LAB — SARS CORONAVIRUS 2 (TAT 6-24 HRS): SARS Coronavirus 2: NEGATIVE

## 2019-07-18 ENCOUNTER — Other Ambulatory Visit: Payer: Self-pay

## 2019-07-18 ENCOUNTER — Ambulatory Visit (HOSPITAL_COMMUNITY)
Admission: RE | Admit: 2019-07-18 | Discharge: 2019-07-19 | Disposition: A | Discharge status: Home / Self Care | Payer: Medicare Other | Attending: Orthopedic Surgery | Admitting: Orthopedic Surgery

## 2019-07-18 ENCOUNTER — Ambulatory Visit (HOSPITAL_COMMUNITY): Payer: Medicare Other | Admitting: Certified Registered"

## 2019-07-18 ENCOUNTER — Encounter (HOSPITAL_COMMUNITY): Payer: Self-pay

## 2019-07-18 ENCOUNTER — Ambulatory Visit (HOSPITAL_COMMUNITY): Payer: Medicare Other

## 2019-07-18 ENCOUNTER — Encounter (HOSPITAL_COMMUNITY)
Admission: RE | Disposition: A | Discharge status: Home / Self Care | Payer: Self-pay | Source: Home / Self Care | Attending: Orthopedic Surgery

## 2019-07-18 ENCOUNTER — Ambulatory Visit (HOSPITAL_COMMUNITY): Payer: Medicare Other | Admitting: Physician Assistant

## 2019-07-18 DIAGNOSIS — Z79899 Other long term (current) drug therapy: Secondary | ICD-10-CM | POA: Diagnosis not present

## 2019-07-18 DIAGNOSIS — Z791 Long term (current) use of non-steroidal anti-inflammatories (NSAID): Secondary | ICD-10-CM | POA: Insufficient documentation

## 2019-07-18 DIAGNOSIS — Z96642 Presence of left artificial hip joint: Secondary | ICD-10-CM | POA: Insufficient documentation

## 2019-07-18 DIAGNOSIS — I10 Essential (primary) hypertension: Secondary | ICD-10-CM | POA: Insufficient documentation

## 2019-07-18 DIAGNOSIS — T84091A Other mechanical complication of internal left hip prosthesis, initial encounter: Secondary | ICD-10-CM | POA: Diagnosis not present

## 2019-07-18 DIAGNOSIS — K219 Gastro-esophageal reflux disease without esophagitis: Secondary | ICD-10-CM | POA: Insufficient documentation

## 2019-07-18 DIAGNOSIS — Z79890 Hormone replacement therapy (postmenopausal): Secondary | ICD-10-CM | POA: Insufficient documentation

## 2019-07-18 DIAGNOSIS — Z853 Personal history of malignant neoplasm of breast: Secondary | ICD-10-CM | POA: Diagnosis not present

## 2019-07-18 DIAGNOSIS — T84018A Broken internal joint prosthesis, other site, initial encounter: Secondary | ICD-10-CM

## 2019-07-18 DIAGNOSIS — Z7982 Long term (current) use of aspirin: Secondary | ICD-10-CM | POA: Insufficient documentation

## 2019-07-18 DIAGNOSIS — T8489XA Other specified complication of internal orthopedic prosthetic devices, implants and grafts, initial encounter: Secondary | ICD-10-CM | POA: Diagnosis not present

## 2019-07-18 DIAGNOSIS — S76312A Strain of muscle, fascia and tendon of the posterior muscle group at thigh level, left thigh, initial encounter: Secondary | ICD-10-CM | POA: Diagnosis not present

## 2019-07-18 DIAGNOSIS — Z96649 Presence of unspecified artificial hip joint: Secondary | ICD-10-CM

## 2019-07-18 DIAGNOSIS — T84018S Broken internal joint prosthesis, other site, sequela: Secondary | ICD-10-CM

## 2019-07-18 DIAGNOSIS — E039 Hypothyroidism, unspecified: Secondary | ICD-10-CM | POA: Insufficient documentation

## 2019-07-18 DIAGNOSIS — T84021A Dislocation of internal left hip prosthesis, initial encounter: Secondary | ICD-10-CM | POA: Diagnosis not present

## 2019-07-18 DIAGNOSIS — Z923 Personal history of irradiation: Secondary | ICD-10-CM | POA: Diagnosis not present

## 2019-07-18 DIAGNOSIS — Y831 Surgical operation with implant of artificial internal device as the cause of abnormal reaction of the patient, or of later complication, without mention of misadventure at the time of the procedure: Secondary | ICD-10-CM | POA: Insufficient documentation

## 2019-07-18 DIAGNOSIS — M25552 Pain in left hip: Secondary | ICD-10-CM | POA: Diagnosis not present

## 2019-07-18 HISTORY — PX: OPEN SURGICAL REPAIR OF GLUTEAL TENDON: SHX5995

## 2019-07-18 LAB — TYPE AND SCREEN
ABO/RH(D): A POS
Antibody Screen: NEGATIVE

## 2019-07-18 SURGERY — REPAIR, TENDON, GLUTEUS MEDIUS, OPEN
Anesthesia: Monitor Anesthesia Care | Laterality: Left

## 2019-07-18 MED ORDER — MORPHINE SULFATE (PF) 2 MG/ML IV SOLN
0.5000 mg | INTRAVENOUS | Status: DC | PRN
  Administered 2019-07-19: 0.5 mg via INTRAVENOUS
  Filled 2019-07-18: qty 1

## 2019-07-18 MED ORDER — POVIDONE-IODINE 10 % EX SWAB
2.0000 "application " | Freq: Once | CUTANEOUS | Status: AC
  Administered 2019-07-18: 2 via TOPICAL

## 2019-07-18 MED ORDER — METOCLOPRAMIDE HCL 5 MG PO TABS: 5.0000 mg | ORAL_TABLET | Freq: Three times a day (TID) | ORAL | Status: DC | PRN

## 2019-07-18 MED ORDER — CHLORHEXIDINE GLUCONATE 4 % EX LIQD: 60.0000 mL | Freq: Once | CUTANEOUS | Status: DC

## 2019-07-18 MED ORDER — ALBUTEROL SULFATE HFA 108 (90 BASE) MCG/ACT IN AERS
INHALATION_SPRAY | RESPIRATORY_TRACT | Status: DC | PRN
  Administered 2019-07-18: 4 via RESPIRATORY_TRACT

## 2019-07-18 MED ORDER — DOCUSATE SODIUM 100 MG PO CAPS
100.0000 mg | ORAL_CAPSULE | Freq: Two times a day (BID) | ORAL | Status: DC
  Administered 2019-07-18 – 2019-07-19 (×2): 100 mg via ORAL
  Filled 2019-07-18 (×2): qty 1

## 2019-07-18 MED ORDER — MENTHOL 3 MG MT LOZG: 1.0000 | LOZENGE | OROMUCOSAL | Status: DC | PRN

## 2019-07-18 MED ORDER — METOCLOPRAMIDE HCL 5 MG/ML IJ SOLN: 5.0000 mg | Freq: Three times a day (TID) | INTRAMUSCULAR | Status: DC | PRN

## 2019-07-18 MED ORDER — SODIUM CHLORIDE 0.9 % IV SOLN
INTRAVENOUS | Status: DC | PRN
  Administered 2019-07-18: 25 ug/min via INTRAVENOUS

## 2019-07-18 MED ORDER — HYDROMORPHONE HCL 2 MG PO TABS
2.0000 mg | ORAL_TABLET | ORAL | Status: DC | PRN
  Administered 2019-07-18 (×2): 2 mg via ORAL
  Filled 2019-07-18 (×2): qty 1

## 2019-07-18 MED ORDER — MIDAZOLAM HCL 2 MG/2ML IJ SOLN
INTRAMUSCULAR | Status: AC
  Filled 2019-07-18: qty 2

## 2019-07-18 MED ORDER — FAMOTIDINE 20 MG PO TABS
10.0000 mg | ORAL_TABLET | Freq: Every day | ORAL | Status: DC
  Administered 2019-07-19: 08:00:00 10 mg via ORAL
  Filled 2019-07-18: qty 1

## 2019-07-18 MED ORDER — PROPOFOL 500 MG/50ML IV EMUL
INTRAVENOUS | Status: DC | PRN
  Administered 2019-07-18: 75 ug/kg/min via INTRAVENOUS

## 2019-07-18 MED ORDER — METHOCARBAMOL 500 MG IVPB - SIMPLE MED
500.0000 mg | Freq: Four times a day (QID) | INTRAVENOUS | Status: DC | PRN
  Filled 2019-07-18: qty 50

## 2019-07-18 MED ORDER — HYDROMORPHONE HCL 2 MG PO TABS
4.0000 mg | ORAL_TABLET | ORAL | Status: DC | PRN
  Administered 2019-07-19: 4 mg via ORAL
  Filled 2019-07-18: qty 2

## 2019-07-18 MED ORDER — MIDAZOLAM HCL 2 MG/2ML IJ SOLN
INTRAMUSCULAR | Status: DC | PRN
  Administered 2019-07-18: 2 mg via INTRAVENOUS

## 2019-07-18 MED ORDER — LEVOTHYROXINE SODIUM 50 MCG PO TABS
50.0000 ug | ORAL_TABLET | Freq: Every day | ORAL | Status: DC
  Administered 2019-07-19: 06:00:00 50 ug via ORAL
  Filled 2019-07-18: qty 1

## 2019-07-18 MED ORDER — CITALOPRAM HYDROBROMIDE 20 MG PO TABS
10.0000 mg | ORAL_TABLET | Freq: Every day | ORAL | Status: DC
  Administered 2019-07-19: 10 mg via ORAL
  Filled 2019-07-18: qty 1

## 2019-07-18 MED ORDER — FENTANYL CITRATE (PF) 100 MCG/2ML IJ SOLN
INTRAMUSCULAR | Status: AC
  Filled 2019-07-18: qty 2

## 2019-07-18 MED ORDER — PROPOFOL 10 MG/ML IV BOLUS
INTRAVENOUS | Status: AC
  Filled 2019-07-18: qty 80

## 2019-07-18 MED ORDER — FLEET ENEMA 7-19 GM/118ML RE ENEM: 1.0000 | ENEMA | Freq: Once | RECTAL | Status: DC | PRN

## 2019-07-18 MED ORDER — TRANEXAMIC ACID-NACL 1000-0.7 MG/100ML-% IV SOLN
INTRAVENOUS | Status: DC | PRN
  Administered 2019-07-18: 1000 mg via INTRAVENOUS

## 2019-07-18 MED ORDER — DIPHENHYDRAMINE HCL 12.5 MG/5ML PO ELIX: 12.5000 mg | ORAL_SOLUTION | ORAL | Status: DC | PRN

## 2019-07-18 MED ORDER — POLYETHYLENE GLYCOL 3350 17 G PO PACK: 17.0000 g | PACK | Freq: Every day | ORAL | Status: DC | PRN

## 2019-07-18 MED ORDER — LIDOCAINE 2% (20 MG/ML) 5 ML SYRINGE
INTRAMUSCULAR | Status: DC | PRN
  Administered 2019-07-18: 60 mg via INTRAVENOUS

## 2019-07-18 MED ORDER — DEXAMETHASONE SODIUM PHOSPHATE 10 MG/ML IJ SOLN
10.0000 mg | Freq: Once | INTRAMUSCULAR | Status: AC
  Administered 2019-07-19: 08:00:00 10 mg via INTRAVENOUS
  Filled 2019-07-18: qty 1

## 2019-07-18 MED ORDER — METHOCARBAMOL 500 MG PO TABS
500.0000 mg | ORAL_TABLET | Freq: Four times a day (QID) | ORAL | Status: DC | PRN
  Administered 2019-07-19: 500 mg via ORAL
  Filled 2019-07-18: qty 1

## 2019-07-18 MED ORDER — ALPRAZOLAM 0.25 MG PO TABS
0.1250 mg | ORAL_TABLET | Freq: Every evening | ORAL | Status: DC | PRN
  Administered 2019-07-18: 0.125 mg via ORAL
  Filled 2019-07-18: qty 1

## 2019-07-18 MED ORDER — FENTANYL CITRATE (PF) 250 MCG/5ML IJ SOLN
INTRAMUSCULAR | Status: DC | PRN
  Administered 2019-07-18: 50 ug via INTRAVENOUS

## 2019-07-18 MED ORDER — CEFAZOLIN SODIUM-DEXTROSE 2-4 GM/100ML-% IV SOLN
2.0000 g | INTRAVENOUS | Status: AC
  Administered 2019-07-18: 13:00:00 2 g via INTRAVENOUS
  Filled 2019-07-18: qty 100

## 2019-07-18 MED ORDER — DEXAMETHASONE SODIUM PHOSPHATE 10 MG/ML IJ SOLN
INTRAMUSCULAR | Status: AC
  Filled 2019-07-18: qty 1

## 2019-07-18 MED ORDER — ONDANSETRON HCL 4 MG/2ML IJ SOLN: 4.0000 mg | Freq: Four times a day (QID) | INTRAMUSCULAR | Status: DC | PRN

## 2019-07-18 MED ORDER — ACETAMINOPHEN 500 MG PO TABS
500.0000 mg | ORAL_TABLET | Freq: Four times a day (QID) | ORAL | Status: DC
  Administered 2019-07-18 – 2019-07-19 (×3): 500 mg via ORAL
  Filled 2019-07-18 (×4): qty 1

## 2019-07-18 MED ORDER — AMLODIPINE BESYLATE 5 MG PO TABS: 5.0000 mg | ORAL_TABLET | Freq: Every day | ORAL | Status: DC

## 2019-07-18 MED ORDER — DEXAMETHASONE SODIUM PHOSPHATE 10 MG/ML IJ SOLN
INTRAMUSCULAR | Status: DC | PRN
  Administered 2019-07-18: 10 mg via INTRAVENOUS

## 2019-07-18 MED ORDER — FENTANYL CITRATE (PF) 250 MCG/5ML IJ SOLN
INTRAMUSCULAR | Status: AC
  Filled 2019-07-18: qty 5

## 2019-07-18 MED ORDER — PROPOFOL 10 MG/ML IV BOLUS
INTRAVENOUS | Status: AC
  Filled 2019-07-18: qty 40

## 2019-07-18 MED ORDER — BUPIVACAINE HCL (PF) 0.25 % IJ SOLN
INTRAMUSCULAR | Status: AC
  Filled 2019-07-18: qty 30

## 2019-07-18 MED ORDER — ONDANSETRON HCL 4 MG/2ML IJ SOLN
INTRAMUSCULAR | Status: DC | PRN
  Administered 2019-07-18: 4 mg via INTRAVENOUS

## 2019-07-18 MED ORDER — FENTANYL CITRATE (PF) 100 MCG/2ML IJ SOLN
25.0000 ug | INTRAMUSCULAR | Status: DC | PRN
  Administered 2019-07-18: 50 ug via INTRAVENOUS

## 2019-07-18 MED ORDER — PHENOL 1.4 % MT LIQD: 1.0000 | OROMUCOSAL | Status: DC | PRN

## 2019-07-18 MED ORDER — BISACODYL 10 MG RE SUPP: 10.0000 mg | Freq: Every day | RECTAL | Status: DC | PRN

## 2019-07-18 MED ORDER — CEFAZOLIN SODIUM-DEXTROSE 2-4 GM/100ML-% IV SOLN
2.0000 g | Freq: Four times a day (QID) | INTRAVENOUS | Status: AC
  Administered 2019-07-18 – 2019-07-19 (×2): 2 g via INTRAVENOUS
  Filled 2019-07-18 (×2): qty 100

## 2019-07-18 MED ORDER — SODIUM CHLORIDE 0.9 % IV SOLN
INTRAVENOUS | Status: DC
  Administered 2019-07-18: 18:00:00 via INTRAVENOUS

## 2019-07-18 MED ORDER — TRANEXAMIC ACID-NACL 1000-0.7 MG/100ML-% IV SOLN
INTRAVENOUS | Status: AC
  Filled 2019-07-18: qty 100

## 2019-07-18 MED ORDER — ONDANSETRON HCL 4 MG PO TABS: 4.0000 mg | ORAL_TABLET | Freq: Four times a day (QID) | ORAL | Status: DC | PRN

## 2019-07-18 MED ORDER — SODIUM CHLORIDE 0.9 % IR SOLN
Status: DC | PRN
  Administered 2019-07-18: 3000 mL

## 2019-07-18 MED ORDER — ASPIRIN EC 325 MG PO TBEC
325.0000 mg | DELAYED_RELEASE_TABLET | Freq: Two times a day (BID) | ORAL | Status: DC
  Administered 2019-07-19: 325 mg via ORAL
  Filled 2019-07-18: qty 1

## 2019-07-18 MED ORDER — ONDANSETRON HCL 4 MG/2ML IJ SOLN
INTRAMUSCULAR | Status: AC
  Filled 2019-07-18: qty 2

## 2019-07-18 MED ORDER — ONDANSETRON HCL 4 MG/2ML IJ SOLN: 4.0000 mg | Freq: Once | INTRAMUSCULAR | Status: DC | PRN

## 2019-07-18 MED ORDER — ACETAMINOPHEN 10 MG/ML IV SOLN
1000.0000 mg | Freq: Four times a day (QID) | INTRAVENOUS | Status: DC
  Administered 2019-07-18: 1000 mg via INTRAVENOUS
  Filled 2019-07-18: qty 100

## 2019-07-18 MED ORDER — PROPOFOL 10 MG/ML IV BOLUS
INTRAVENOUS | Status: DC | PRN
  Administered 2019-07-18 (×2): 40 mg via INTRAVENOUS

## 2019-07-18 MED ORDER — LACTATED RINGERS IV SOLN
INTRAVENOUS | Status: DC
  Administered 2019-07-18 (×3): via INTRAVENOUS

## 2019-07-18 SURGICAL SUPPLY — 52 items
BAG SPEC THK2 15X12 ZIP CLS (MISCELLANEOUS)
BAG ZIPLOCK 12X15 (MISCELLANEOUS) IMPLANT
BALL HIP PLUS 3MM (Hips) IMPLANT
BIT DRILL 2.8X128 (BIT) ×1 IMPLANT
BLADE EXTENDED COATED 6.5IN (ELECTRODE) IMPLANT
COVER SURGICAL LIGHT HANDLE (MISCELLANEOUS) ×2 IMPLANT
COVER WAND RF STERILE (DRAPES) IMPLANT
DRAPE INCISE IOBAN 66X45 STRL (DRAPES) ×2 IMPLANT
DRAPE ORTHO SPLIT 77X108 STRL (DRAPES) ×4
DRAPE POUCH INSTRU U-SHP 10X18 (DRAPES) ×2 IMPLANT
DRAPE SURG ORHT 6 SPLT 77X108 (DRAPES) ×2 IMPLANT
DRAPE U-SHAPE 47X51 STRL (DRAPES) ×2 IMPLANT
DRSG ADAPTIC 3X8 NADH LF (GAUZE/BANDAGES/DRESSINGS) ×2 IMPLANT
DRSG MEPILEX BORDER 4X8 (GAUZE/BANDAGES/DRESSINGS) ×2 IMPLANT
DURAPREP 26ML APPLICATOR (WOUND CARE) ×2 IMPLANT
ELECT REM PT RETURN 15FT ADLT (MISCELLANEOUS) ×2 IMPLANT
EVACUATOR 1/8 PVC DRAIN (DRAIN) IMPLANT
FACESHIELD WRAPAROUND (MASK) ×10 IMPLANT
FACESHIELD WRAPAROUND OR TEAM (MASK) ×2 IMPLANT
GAUZE SPONGE 4X4 12PLY STRL (GAUZE/BANDAGES/DRESSINGS) ×2 IMPLANT
GLOVE BIO SURGEON STRL SZ7 (GLOVE) ×2 IMPLANT
GLOVE BIO SURGEON STRL SZ8 (GLOVE) ×6 IMPLANT
GLOVE BIOGEL PI IND STRL 7.0 (GLOVE) ×1 IMPLANT
GLOVE BIOGEL PI IND STRL 8 (GLOVE) ×2 IMPLANT
GLOVE BIOGEL PI INDICATOR 7.0 (GLOVE) ×1
GLOVE BIOGEL PI INDICATOR 8 (GLOVE) ×2
GOWN STRL REUS W/TWL LRG LVL3 (GOWN DISPOSABLE) ×4 IMPLANT
HANDPIECE INTERPULSE COAX TIP (DISPOSABLE) ×2
HIP BALL PLUS 3MM (Hips) ×2 IMPLANT
KIT BASIN OR (CUSTOM PROCEDURE TRAY) ×2 IMPLANT
KIT TURNOVER KIT A (KITS) ×1 IMPLANT
LINER ACETABULAR 28MM +4 ESCON (Hips) ×1 IMPLANT
MANIFOLD NEPTUNE II (INSTRUMENTS) ×2 IMPLANT
NDL MA TROC 1/2 (NEEDLE) IMPLANT
NDL SAFETY ECLIPSE 18X1.5 (NEEDLE) ×2 IMPLANT
NEEDLE HYPO 18GX1.5 SHARP (NEEDLE) ×4
NEEDLE MA TROC 1/2 (NEEDLE) IMPLANT
NS IRRIG 1000ML POUR BTL (IV SOLUTION) ×2 IMPLANT
PACK TOTAL JOINT (CUSTOM PROCEDURE TRAY) ×2 IMPLANT
PASSER SUT SWANSON 36MM LOOP (INSTRUMENTS) ×1 IMPLANT
PROTECTOR NERVE ULNAR (MISCELLANEOUS) ×3 IMPLANT
SET HNDPC FAN SPRY TIP SCT (DISPOSABLE) IMPLANT
STAPLER VISISTAT 35W (STAPLE) IMPLANT
STRIP CLOSURE SKIN 1/2X4 (GAUZE/BANDAGES/DRESSINGS) ×4 IMPLANT
SUT ETHIBOND NAB CT1 #1 30IN (SUTURE) IMPLANT
SUT MNCRL AB 4-0 PS2 18 (SUTURE) ×2 IMPLANT
SUT STRATAFIX 0 PDS 27 VIOLET (SUTURE) ×2
SUT VIC AB 2-0 CT1 27 (SUTURE) ×6
SUT VIC AB 2-0 CT1 TAPERPNT 27 (SUTURE) ×2 IMPLANT
SUTURE STRATFX 0 PDS 27 VIOLET (SUTURE) IMPLANT
SYR 30ML LL (SYRINGE) ×4 IMPLANT
TOWEL OR 17X26 10 PK STRL BLUE (TOWEL DISPOSABLE) ×3 IMPLANT

## 2019-07-18 NOTE — Discharge Instructions (Signed)
Dr. Gaynelle Arabian Total Joint Specialist [facility] [address], [place], [place] [address] [phone]   POSTERIOR TOTAL HIP REPLACEMENT POSTOPERATIVE DIRECTIONS  Hip Rehabilitation, Guidelines Following Surgery  The results of a hip operation are greatly improved after range of motion and muscle strengthening exercises. Follow all safety measures which are given to protect your hip. If any of these exercises cause increased pain or swelling in your joint, decrease the amount until you are comfortable again. Then slowly increase the exercises. Call your caregiver if you have problems or questions.   HOME CARE INSTRUCTIONS  Remove items at home which could result in a fall. This includes throw rugs or furniture in walking pathways.   ICE to the affected hip every three hours for 30 minutes at a time and then as needed for pain and swelling.  Continue to use ice on the hip for pain and swelling from surgery. You may notice swelling that will progress down to the foot and ankle.  This is normal after surgery.  Elevate the leg when you are not up walking on it.    Continue to use the breathing machine which will help keep your temperature down.  It is common for your temperature to cycle up and down following surgery, especially at night when you are not up moving around and exerting yourself.  The breathing machine keeps your lungs expanded and your temperature down.  DIET You may resume your previous home diet once your are discharged from the hospital.  DRESSING / WOUND CARE / SHOWERING You may shower 3 days after surgery, but keep the wounds dry during showering.  You may use an occlusive plastic wrap (Press'n Seal for example), NO SOAKING/SUBMERGING IN THE BATHTUB.  If the bandage gets wet, change with a clean dry gauze.  If the incision gets wet, pat the wound dry with a clean towel. You may start showering once you are discharged home but do not submerge the incision  under water. Just pat the incision dry and apply a dry gauze dressing on daily. Change the surgical dressing daily and reapply a dry dressing each time.    ACTIVITY Walk with your walker as instructed. Use walker as long as suggested by your caregivers. Avoid periods of inactivity such as sitting longer than an hour when not asleep. This helps prevent blood clots.  You may resume a sexual relationship in one month or when given the OK by your doctor.  You may return to work once you are cleared by your doctor.  Do not drive a car for 6 weeks or until released by you surgeon.  Do not drive while taking narcotics.  WEIGHT BEARING Weight bearing as tolerated with assist device (walker, cane, etc) as directed, use it as long as suggested by your surgeon or therapist, typically at least 4-6 weeks.  POSTOPERATIVE CONSTIPATION PROTOCOL Constipation - defined medically as fewer than three stools per week and severe constipation as less than one stool per week.  One of the most common issues patients have following surgery is constipation.  Even if you have a regular bowel pattern at home, your normal regimen is likely to be disrupted due to multiple reasons following surgery.  Combination of anesthesia, postoperative narcotics, change in appetite and fluid intake all can affect your bowels.  In order to avoid complications following surgery, here are some recommendations in order to help you during your recovery period.  Colace (docusate) - Pick up an over-the-counter  form of Colace or another stool softener and take twice a day as long as you are requiring postoperative pain medications.  Take with a full glass of water daily.  If you experience loose stools or diarrhea, hold the colace until you stool forms back up.  If your symptoms do not get better within 1 week or if they get worse, check with your doctor.  Dulcolax (bisacodyl) - Pick up over-the-counter and take as directed by the product  packaging as needed to assist with the movement of your bowels.  Take with a full glass of water.  Use this product as needed if not relieved by Colace only.   MiraLax (polyethylene glycol) - Pick up over-the-counter to have on hand.  MiraLax is a solution that will increase the amount of water in your bowels to assist with bowel movements.  Take as directed and can mix with a glass of water, juice, soda, coffee, or tea.  Take if you go more than two days without a movement. Do not use MiraLax more than once per day. Call your doctor if you are still constipated or irregular after using this medication for 7 days in a row.  If you continue to have problems with postoperative constipation, please contact the office for further assistance and recommendations.  If you experience "the worst abdominal pain ever" or develop nausea or vomiting, please contact the office immediatly for further recommendations for treatment.  ITCHING  If you experience itching with your medications, try taking only a single pain pill, or even half a pain pill at a time.  You can also use Benadryl over the counter for itching or also to help with sleep.   TED HOSE STOCKINGS Wear the elastic stockings on both legs for three weeks following surgery during the day but you may remove then at night for sleeping.  MEDICATIONS See your medication summary on the After Visit Summary that the nursing staff will review with you prior to discharge.  You may have some home medications which will be placed on hold until you complete the course of blood thinner medication.  It is important for you to complete the blood thinner medication as prescribed by your surgeon.  Continue your approved medications as instructed at time of discharge.  PRECAUTIONS If you experience chest pain or shortness of breath - call 911 immediately for transfer to the hospital emergency department.  If you develop a fever greater that 101 F, purulent drainage  from wound, increased redness or drainage from wound, foul odor from the wound/dressing, or calf pain - CONTACT YOUR SURGEON.                                                   FOLLOW-UP APPOINTMENTS Make sure you keep all of your appointments after your operation with your surgeon and caregivers. You should call the office at the above phone number and make an appointment for approximately two weeks after the date of your surgery or on the date instructed by your surgeon outlined in the "After Visit Summary".  RANGE OF MOTION AND STRENGTHENING EXERCISES  These exercises are designed to help you keep full movement of your hip joint. Follow your caregiver's or physical therapist's instructions. Perform all exercises about fifteen times, three times per day or as directed. Exercise both hips, even if you  have had only one joint replacement. These exercises can be done on a training (exercise) mat, on the floor, on a table or on a bed. Use whatever works the best and is most comfortable for you. Use music or television while you are exercising so that the exercises are a pleasant break in your day. This will make your life better with the exercises acting as a break in routine you can look forward to.  Lying on your back, slowly slide your foot toward your buttocks, raising your knee up off the floor. Then slowly slide your foot back down until your leg is straight again.  Lying on your back spread your legs as far apart as you can without causing discomfort.  Lying on your side, raise your upper leg and foot straight up from the floor as far as is comfortable. Slowly lower the leg and repeat.  Lying on your back, tighten up the muscle in the front of your thigh (quadriceps muscles). You can do this by keeping your leg straight and trying to raise your heel off the floor. This helps strengthen the largest muscle supporting your knee.  Lying on your back, tighten up the muscles of your buttocks both with the  legs straight and with the knee bent at a comfortable angle while keeping your heel on the floor.      IF YOU ARE TRANSFERRED TO A SKILLED REHAB FACILITY If the patient is transferred to a skilled rehab facility following release from the hospital, a list of the current medications will be sent to the facility for the patient to continue.  When discharged from the skilled rehab facility, please have the facility set up the patient's [place] prior to being released. Also, the skilled facility will be responsible for providing the patient with their medications at time of release from the facility to include their pain medication, the muscle relaxants, and their blood thinner medication. If the patient is still at the rehab facility at time of the two week follow up appointment, the skilled rehab facility will also need to assist the patient in arranging follow up appointment in our office and any transportation needs.  MAKE SURE YOU:  Understand these instructions.  Get help right away if you are not doing well or get worse.    Pick up stool softner and laxative for home use following surgery while on pain medications. Do not submerge incision under water. Please use good hand washing techniques while changing dressing each day. May shower starting three days after surgery. Please use a clean towel to pat the incision dry following showers. Continue to use ice for pain and swelling after surgery. Do not use any lotions or creams on the incision until instructed by your surgeon.

## 2019-07-18 NOTE — Brief Op Note (Signed)
07/18/2019  2:17 PM  PATIENT:  [name]  68 y.o. 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  PROCEDURE:  Procedure(s) with comments: Left hip bearing surface revision with gluteal tendon repair (Left) - 2 hrs  SURGEON:  Surgeon(s) and Role:    Gaynelle Arabian, MD - Primary  PHYSICIAN ASSISTANT:   ASSISTANTS: Ardeen Jourdain, PA-C   ANESTHESIA:   spinal  EBL:  200 mL   BLOOD ADMINISTERED:none  DRAINS: (Medium) Hemovact drain(s) in the left hip with  Suction Open   LOCAL MEDICATIONS USED:  NONE  COUNTS:  YES  TOURNIQUET:  * No tourniquets in log *  DICTATION: .Other Dictation: Dictation Number [id number]  PLAN OF CARE: Admit to inpatient   PATIENT DISPOSITION:  PACU - hemodynamically stable.

## 2019-07-18 NOTE — Anesthesia Procedure Notes (Signed)
Spinal  Patient location during procedure: OR Start time: 07/18/2019 12:45 PM End time: 07/18/2019 12:55 PM Staffing Anesthesiologist: Roberts Gaudy, MD Performed: anesthesiologist  Preanesthetic Checklist Completed: patient identified, site marked, surgical consent, pre-op evaluation, timeout performed, IV checked, risks and benefits discussed and monitors and equipment checked Spinal Block Patient position: sitting Prep: DuraPrep Patient monitoring: heart rate, cardiac monitor, continuous pulse ox and blood pressure Approach: midline Location: L3-4 Injection technique: single-shot Needle Needle type: Sprotte  Needle gauge: 24 G Needle length: 9 cm Assessment Sensory level: T4 Additional Notes 1.6 cc 0.75% Bupivacaine injected easily.

## 2019-07-18 NOTE — Anesthesia Preprocedure Evaluation (Signed)
Anesthesia Evaluation  Patient identified by MRN, date of birth, ID band Patient awake    Reviewed: Allergy & Precautions, NPO status , Patient's Chart, lab work & pertinent test results  Airway Mallampati: II  TM Distance: >3 FB Neck ROM: Full    Dental  (+) Teeth Intact, Dental Advisory Given   Pulmonary    breath sounds clear to auscultation       Cardiovascular hypertension,  Rhythm:Regular Rate:Normal     Neuro/Psych    GI/Hepatic   Endo/Other    Renal/GU      Musculoskeletal   Abdominal (+) + obese,   Peds  Hematology   Anesthesia Other Findings   Reproductive/Obstetrics                             Anesthesia Physical Anesthesia Plan  ASA: III  Anesthesia Plan: MAC and Spinal   Post-op Pain Management:    Induction: Intravenous  PONV Risk Score and Plan:   Airway Management Planned: Natural Airway and Simple Face Mask  Additional Equipment:   Intra-op Plan:   Post-operative Plan:   Informed Consent: I have reviewed the patients History and Physical, chart, labs and discussed the procedure including the risks, benefits and alternatives for the proposed anesthesia with the patient or authorized representative who has indicated his/her understanding and acceptance.     Dental advisory given  Plan Discussed with: CRNA and Anesthesiologist  Anesthesia Plan Comments:         Anesthesia Quick Evaluation

## 2019-07-18 NOTE — Plan of Care (Signed)
Plan of care 

## 2019-07-18 NOTE — Anesthesia Procedure Notes (Signed)
Date/Time: 07/18/2019 12:46 PM Performed by: Cynda Familia, CRNA Pre-anesthesia Checklist: Patient identified, Emergency Drugs available, Suction available, Patient being monitored and Timeout performed Patient Re-evaluated:Patient Re-evaluated prior to induction Oxygen Delivery Method: Simple face mask Placement Confirmation: positive ETCO2 and breath sounds checked- equal and bilateral Dental Injury: Teeth and Oropharynx as per pre-operative assessment

## 2019-07-18 NOTE — Interval H&P Note (Signed)
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  The various methods of treatment have been discussed with the patient and family. After consideration of risks, benefits and other options for treatment, the patient has consented to  Procedure(s) with comments: Left hip bearing surface revision with gluteal tendon repair (Left) - 2 hrs as a surgical intervention.  The patient's history has been reviewed, patient examined, no change in status, stable for surgery.  I have reviewed the patient's chart and labs.  Questions were answered to the patient's satisfaction.     Pilar Plate Sana Tessmer

## 2019-07-18 NOTE — Anesthesia Postprocedure Evaluation (Signed)
Anesthesia Post Note  Patient: Theresa Bradley  Procedure(s) Performed: Left hip bearing surface revision with gluteal tendon repair (Left )     Patient location during evaluation: PACU Anesthesia Type: MAC Level of consciousness: oriented and awake and alert Pain management: pain level controlled Vital Signs Assessment: post-procedure vital signs reviewed and stable Respiratory status: spontaneous breathing, respiratory function stable and patient connected to nasal cannula oxygen Cardiovascular status: blood pressure returned to baseline and stable Postop Assessment: no headache, no backache and no apparent nausea or vomiting Anesthetic complications: no    Last Vitals:  Vitals:   07/18/19 1849 07/18/19 1933  BP: (!) 112/52 (!) 102/54  Pulse: 70 67  Resp: 16   Temp: 36.7 C 37.2 C  SpO2: 97% 96%    Last Pain:  Vitals:   07/18/19 1933  TempSrc: Oral  PainSc:                  Alverna Fawley COKER

## 2019-07-18 NOTE — Op Note (Signed)
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  PROCEDURE:  Left hip bearing surface revision with gluteal tendon repair.  SURGEON:  Gaynelle Arabian, MD  ASSISTANT:  Ardeen Jourdain, PA-C  ANESTHESIA:  Spinal.  ESTIMATED BLOOD LOSS:  200 mL.  DRAIN:  Hemovac x1.  COMPLICATIONS:  None.  CONDITION:  Stable to recovery.  BRIEF CLINICAL NOTE:  The patient is a 68 year old female who underwent left total hip arthroplasty several years ago with metal-on-metal construct.  She had a dislocation episode over a year ago.  She has had progressively worsening lateral hip pain.  A  MARS-MRI showed a large fluid collection around the joint.  She also had evidence of disruption of the gluteus medius tendon, and her cobalt and chromium ion levels were normal; however, she presents now for bearing surface versus total hip revision.  PROCEDURE IN DETAIL:  After successful administration of spinal anesthetic, the patient was placed in right lateral decubitus position with the left side up and held with a hip positioner.  Left lower extremity was isolated from her perineum with plastic  drapes and prepped and draped in the usual sterile fashion.  Posterolateral incision was made with a 10 blade through subcutaneous tissue to the level of the fascia lata, which was incised in line with the skin incision.  There was a large fluid  collection consistent with metallic debris and hematoma.  That was sent for Gram stain, which is negative.  There is also a membrane surrounding the joint which was containing this fluid.  I  thoroughly irrigated out the fluid with pulsatile lavage and  then debrided the membrane carefully, separating it from the normal underlying tissue.  This was done circumferentially around the joint.  The sciatic nerve had been palpated and protected prior to doing this dissection.  The hip was then dislocated and  the femoral head was removed.  The femoral component is in excellent position and is well fixed.  There is no evidence of any burnishing or scratches on the femoral neck.  The femur was then retracted anteriorly to gain acetabular exposure.  The  acetabular component is also in excellent anatomic position.  The acetabular liner was then removed from the acetabular shell.  This was a metal liner.  The acetabular shell was well fixed and once again is in excellent position.  We thoroughly irrigated  out this area, removing any fibrous tissue from around the edges of the acetabulum.  It was decided to go ahead and do a constrained liner given her previous history of instability and also given the disruption in the abductors.  The constrained liner  for the 50 mm Pinnacle acetabular shell was then impacted into position.  We had to use a 28 mm head for that liner.  A 28.3 plus head was then placed onto the femoral neck, and the hip was reduced and snapped into the locked position.  The locking ring  was then impacted around the edges of the polyethylene.  There was excellent range of motion with full extension, full external rotation without impingement, 70 degrees flexion, 40 degrees adduction, 90 degrees of internal rotation without impingement,  and 90 degrees of flexion  and 70 degrees of internal rotation without impingement.  The wound was further irrigated with another liter of saline with pulsatile lavage.  The remaining tissue had a normal appearance.  All of the affected tissue had been  removed previously.  The abductors were attached to a small piece of bone on the greater trochanter, and  essentially the disruption was through bone and out through the tendon.  I weaved Ethibond suture through the tendon and through that piece of bone.   We then repaired it to the greater trochanter through drill holes as the Ethibond sutures were passed through drill holes, and the sutures were tied to the trochanter.  This was a very stable repair and remained intact throughout the above-mentioned  range of motion.  The wound was further irrigated, and then the fascia lata was closed over a Hemovac drain with a running 0 Stratafix suture.  Subcu was closed with interrupted 2-0 Vicryl and subcuticular is running 4-0 Monocryl.  The incision was  cleaned and dried, and Steri-Strips and a bulky sterile dressing were applied.  She was then awakened and transported to recovery in stable condition.  LN/NUANCE  D:07/18/2019 T:07/18/2019 JOB:[id number]

## 2019-07-18 NOTE — Transfer of Care (Signed)
Immediate Anesthesia Transfer of Care Note  Patient: Theresa Bradley  Procedure(s) Performed: Left hip bearing surface revision with gluteal tendon repair (Left )  Patient Location: PACU  Anesthesia Type:Spinal  Level of Consciousness: sedated  Airway & Oxygen Therapy: Patient Spontanous Breathing and Patient connected to face mask oxygen  Post-op Assessment: Report given to RN and Post -op Vital signs reviewed and stable  Post vital signs: Reviewed and stable  Last Vitals:  Vitals Value Taken Time  BP 116/55 07/18/19 1435  Temp    Pulse 77 07/18/19 1436  Resp 23 07/18/19 1436  SpO2 100 % 07/18/19 1436  Vitals shown include unvalidated device data.  Last Pain:  Vitals:   07/18/19 1030  TempSrc: Oral  PainSc: 7       Patients Stated Pain Goal: 3 ([id number])  Complications: No apparent anesthesia complications

## 2019-07-19 ENCOUNTER — Encounter (HOSPITAL_COMMUNITY): Payer: Self-pay | Admitting: Orthopedic Surgery

## 2019-07-19 DIAGNOSIS — K219 Gastro-esophageal reflux disease without esophagitis: Secondary | ICD-10-CM | POA: Diagnosis not present

## 2019-07-19 DIAGNOSIS — E039 Hypothyroidism, unspecified: Secondary | ICD-10-CM | POA: Diagnosis not present

## 2019-07-19 DIAGNOSIS — Z853 Personal history of malignant neoplasm of breast: Secondary | ICD-10-CM | POA: Diagnosis not present

## 2019-07-19 DIAGNOSIS — T84091A Other mechanical complication of internal left hip prosthesis, initial encounter: Secondary | ICD-10-CM | POA: Diagnosis not present

## 2019-07-19 DIAGNOSIS — Z791 Long term (current) use of non-steroidal anti-inflammatories (NSAID): Secondary | ICD-10-CM | POA: Diagnosis not present

## 2019-07-19 DIAGNOSIS — Z96642 Presence of left artificial hip joint: Secondary | ICD-10-CM | POA: Diagnosis not present

## 2019-07-19 DIAGNOSIS — Z923 Personal history of irradiation: Secondary | ICD-10-CM | POA: Diagnosis not present

## 2019-07-19 DIAGNOSIS — Z79899 Other long term (current) drug therapy: Secondary | ICD-10-CM | POA: Diagnosis not present

## 2019-07-19 DIAGNOSIS — I10 Essential (primary) hypertension: Secondary | ICD-10-CM | POA: Diagnosis not present

## 2019-07-19 DIAGNOSIS — Z7982 Long term (current) use of aspirin: Secondary | ICD-10-CM | POA: Diagnosis not present

## 2019-07-19 LAB — CBC
HCT: 35 % — ABNORMAL LOW (ref 36.0–46.0)
Hemoglobin: 11.2 g/dL — ABNORMAL LOW (ref 12.0–15.0)
MCH: 29.7 pg (ref 26.0–34.0)
MCHC: 32 g/dL (ref 30.0–36.0)
MCV: 92.8 fL (ref 80.0–100.0)
Platelets: 317 10*3/uL (ref 150–400)
RBC: 3.77 MIL/uL — ABNORMAL LOW (ref 3.87–5.11)
RDW: 13.1 % (ref 11.5–15.5)
WBC: 16 10*3/uL — ABNORMAL HIGH (ref 4.0–10.5)
nRBC: 0 % (ref 0.0–0.2)

## 2019-07-19 LAB — BASIC METABOLIC PANEL
Anion gap: 4 — ABNORMAL LOW (ref 5–15)
BUN: 10 mg/dL (ref 8–23)
CO2: 28 mmol/L (ref 22–32)
Calcium: 8.5 mg/dL — ABNORMAL LOW (ref 8.9–10.3)
Chloride: 103 mmol/L (ref 98–111)
Creatinine, Ser: 0.67 mg/dL (ref 0.44–1.00)
GFR calc Af Amer: >60 mL/min (ref >60)
GFR calc non Af Amer: >60 mL/min (ref >60)
Glucose, Bld: 151 mg/dL — ABNORMAL HIGH (ref 70–99)
Potassium: 3.4 mmol/L — ABNORMAL LOW (ref 3.5–5.1)
Sodium: 135 mmol/L (ref 135–145)

## 2019-07-19 MED ORDER — ASPIRIN 325 MG PO TBEC: 325.0000 mg | DELAYED_RELEASE_TABLET | Freq: Two times a day (BID) | ORAL | 0 refills | Status: DC

## 2019-07-19 MED ORDER — METHOCARBAMOL 500 MG PO TABS: 500.0000 mg | ORAL_TABLET | Freq: Four times a day (QID) | ORAL | 0 refills | Status: DC | PRN

## 2019-07-19 MED ORDER — HYDROCODONE-ACETAMINOPHEN 5-325 MG PO TABS
1.0000 | ORAL_TABLET | ORAL | Status: DC | PRN
  Administered 2019-07-19 (×2): 2 via ORAL
  Filled 2019-07-19 (×2): qty 2

## 2019-07-19 MED ORDER — POTASSIUM CHLORIDE CRYS ER 20 MEQ PO TBCR
40.0000 meq | EXTENDED_RELEASE_TABLET | Freq: Once | ORAL | Status: AC
  Administered 2019-07-19: 40 meq via ORAL
  Filled 2019-07-19: qty 2

## 2019-07-19 MED ORDER — HYDROCODONE-ACETAMINOPHEN 5-325 MG PO TABS: 1.0000 | ORAL_TABLET | Freq: Four times a day (QID) | ORAL | 0 refills | Status: DC | PRN

## 2019-07-19 NOTE — Evaluation (Signed)
Physical Therapy Evaluation Patient Details Name: PURVA TREMINIO MRN: [id number] DOB: Sep 02, 1951 Today's Date: 07/19/2019   History of Present Illness  Left hip bearing surface revision with gluteal tendon repair (Left)  Clinical Impression  Pt presents with dependencies in mobility secondary to the above diagnosis. Pt is ambulating 100 feet with supervision. Pt educated on posterior hip precautions and given an exercise program. Pt will benefit from stair training this afternoon and may be ready for d/c home this afternoon.     Follow Up Recommendations Follow surgeon's recommendation for DC plan and follow-up therapies    Equipment Recommendations  None recommended by PT    Recommendations for Other Services       Precautions / Restrictions Precautions Precautions: Posterior Hip Precaution Comments: reviewed posterior hip precautions Restrictions Weight Bearing Restrictions: No      Mobility  Bed Mobility Overal bed mobility: Needs Assistance Bed Mobility: Supine to Sit     Supine to sit: Supervision     General bed mobility comments: cues for posterior hip precautions  Transfers Overall transfer level: Needs assistance Equipment used: Rolling walker (2 wheeled) Transfers: Sit to/from Stand Sit to Stand: Supervision         General transfer comment: cues for hand placement for sitting in recliner  Ambulation/Gait Ambulation/Gait assistance: Supervision Gait Distance (Feet): 120 Feet Assistive device: Rolling walker (2 wheeled) Gait Pattern/deviations: Step-through pattern;Decreased stride length Gait velocity: decreased      Stairs            Wheelchair Mobility    Modified Rankin (Stroke Patients Only)       Balance Overall balance assessment: No apparent balance deficits (not formally assessed)                                           Pertinent Vitals/Pain Pain Assessment: 0-10 Pain Score: 2  Pain Descriptors /  Indicators: Discomfort Pain Intervention(s): Limited activity within patient's tolerance;Monitored during session    Home Living Family/patient expects to be discharged to:: Private residence Living Arrangements: Spouse/significant other Available Help at Discharge: Family Type of Home: House Home Access: Stairs to enter Entrance Stairs-Rails: Right;Left;Can reach both Entrance Stairs-Number of Steps: 5 Home Layout: One level Home Equipment: Environmental consultant - 2 wheels;Cane - single point      Prior Function Level of Independence: Independent               Hand Dominance        Extremity/Trunk Assessment   Upper Extremity Assessment Upper Extremity Assessment: Defer to OT evaluation    Lower Extremity Assessment Lower Extremity Assessment: LLE deficits/detail LLE Deficits / Details: 2-3/5 due to surgical pain LLE: Unable to fully assess due to pain    Cervical / Trunk Assessment Cervical / Trunk Assessment: Normal  Communication   Communication: No difficulties  Cognition Arousal/Alertness: Awake/alert Behavior During Therapy: WFL for tasks assessed/performed Overall Cognitive Status: Within Functional Limits for tasks assessed                                        General Comments      Exercises Total Joint Exercises Ankle Circles/Pumps: AROM;10 reps;Supine Quad Sets: AROM;Strengthening;Both;10 reps;Supine Heel Slides: Strengthening;Left;10 reps;AROM;Supine Hip ABduction/ADduction: AROM;Strengthening;Left;10 reps;Supine   Assessment/Plan    PT Assessment Patient needs  continued PT services  PT Problem List Decreased strength;Decreased mobility;Decreased safety awareness;Decreased activity tolerance;Decreased knowledge of precautions       PT Treatment Interventions DME instruction;Therapeutic activities;Gait training;Therapeutic exercise;Patient/family education;Stair training;Functional mobility training    PT Goals (Current goals can be  found in the Care Plan section)  Acute Rehab PT Goals Patient Stated Goal: To go home PT Goal Formulation: With patient Time For Goal Achievement: 07/26/19 Potential to Achieve Goals: Good    Frequency 7X/week   Barriers to discharge        Co-evaluation               AM-PAC PT "6 Clicks" Mobility  Outcome Measure Help needed turning from your back to your side while in a flat bed without using bedrails?: A Little Help needed moving from lying on your back to sitting on the side of a flat bed without using bedrails?: A Little Help needed moving to and from a bed to a chair (including a wheelchair)?: A Little Help needed standing up from a chair using your arms (e.g., wheelchair or bedside chair)?: A Little Help needed to walk in hospital room?: A Little Help needed climbing 3-5 steps with a railing? : A Little 6 Click Score: 18    End of Session Equipment Utilized During Treatment: Gait belt Activity Tolerance: Patient tolerated treatment well Patient left: in chair;with call bell/phone within reach;with family/visitor present Nurse Communication: Mobility status PT Visit Diagnosis: Difficulty in walking, not elsewhere classified (R26.2)    Time: [id number] PT Time Calculation (min) (ACUTE ONLY): 29 min   Charges:   PT Evaluation $PT Eval Low Complexity: 1 Low PT Treatments $Gait Training: 8-22 mins        Theodoro Grist, PT  Lelon Mast 07/19/2019, 12:10 PM

## 2019-07-19 NOTE — Progress Notes (Signed)
   Subjective: 1 Day Post-Op Procedure(s) (LRB): Left hip bearing surface revision with gluteal tendon repair (Left) Patient reports pain as mild.   Patient seen in rounds by Dr. Wynelle Link. Patient is well, and has had no acute complaints or problems other than discomfort in the left hip. No acute events overnight. Foley catheter removed, positive flatus. Patient states she is ready to go home today. We will start therapy today.   Objective: Vital signs in last 24 hours: Temp:  [97.9 F (36.6 C)-99 F (37.2 C)] 97.9 F (36.6 C) (08/25 0653) Pulse Rate:  [51-73] 53 (08/25 0653) Resp:  [12-20] 18 (08/25 0653) BP: (93-152)/(49-80) 116/56 (08/25 0653) SpO2:  [96 %-100 %] 98 % (08/25 0653) Weight:  [76.7 kg] 76.7 kg (08/24 1634)  Intake/Output from previous day:  Intake/Output Summary (Last 24 hours) at 07/19/2019 0714 Last data filed at 07/19/2019 [id number] Gross per 24 hour  Intake 3103.22 ml  Output 2365 ml  Net 738.22 ml     Intake/Output this shift: No intake/output data recorded.  Labs: Recent Labs    07/19/19 0319  HGB 11.2*   Recent Labs    07/19/19 0319  WBC 16.0*  RBC 3.77*  HCT 35.0*  PLT 317   Recent Labs    07/19/19 0319  NA 135  K 3.4*  CL 103  CO2 28  BUN 10  CREATININE 0.67  GLUCOSE 151*  CALCIUM 8.5*   No results for input(s): LABPT, INR in the last 72 hours.  Exam: General - Patient is Alert and Oriented Extremity - Neurologically intact Sensation intact distally Intact pulses distally Dorsiflexion/Plantar flexion intact Dressing - dressing C/D/I Motor Function - intact, moving foot and toes well on exam.   Past Medical History:  Diagnosis Date  . Anxiety   . Breast cancer ([place]) 2009   right  . Cancer ([place])   . COUGH, CHRONIC 05/10/2010  . Dysrhythmia    occationally "skips a beat"  . HYPERTENSION 04/16/2009  . HYPOTHYROIDISM 04/16/2009  . OSTEOARTHRITIS 04/16/2009  . Personal history of radiation therapy   . SENILE LENTIGO 06/10/2010     Assessment/Plan: 1 Day Post-Op Procedure(s) (LRB): Left hip bearing surface revision with gluteal tendon repair (Left) Principal Problem:   Failed total hip arthroplasty, sequela Active Problems:   Failed total hip arthroplasty ([place])  Estimated body mass index is 29.02 kg/m as calculated from the following:   Height as of this encounter: 5\' 4"  (1.626 m).   Weight as of this encounter: 76.7 kg. Advance diet Up with therapy D/C IV fluids  DVT Prophylaxis - Aspirin Weight bearing as tolerated. D/C O2 and pulse ox and try on room air. Hemovac pulled without difficulty, will begin therapy today.  Potassium of 3.4 today, one dose of potassium chloride 40 mEq ordered. Plan is to go Home after hospital stay with HEP. Plan for discharge today as long as she is meeting goals with therapy. We have switched her pain medication from Dilaudid to [facility], which we will send her home with. Follow up in th office in 2 weeks.  Griffith Citron, PA-C Orthopedic Surgery 07/19/2019, 7:14 AM

## 2019-07-19 NOTE — Progress Notes (Signed)
Physical Therapy Treatment Patient Details Name: Theresa Bradley MRN: [id number] DOB: 03/25/1951 Today's Date: 07/19/2019    History of Present Illness Left hip bearing surface revision with gluteal tendon repair (Left)    PT Comments    Pt has met PT goals for mobility. Pt is ambulating mod Independently with RW 117f. Pt has completed stair training with min guard assist. Reviewed HEP and posterior hip precautions. Pt and spouse agreeable to d/c home this afternoon. Pt has met all PT goals is d/c from acute PT services.  Follow Up Recommendations  Follow surgeon's recommendation for DC plan and follow-up therapies     Equipment Recommendations  None recommended by PT    Recommendations for Other Services       Precautions / Restrictions Precautions Precautions: Posterior Hip Precaution Booklet Issued: Yes (comment) Precaution Comments: reviewed posterior hip precautions Restrictions Weight Bearing Restrictions: No    Mobility  Bed Mobility Overal bed mobility: Modified Independent Bed Mobility: Supine to Sit     Supine to sit: Modified independent (Device/Increase time);HOB elevated     General bed mobility comments: cues for posterior hip precautions  Transfers Overall transfer level: Modified independent Equipment used: Rolling walker (2 wheeled) Transfers: Sit to/from Stand Sit to Stand: Modified independent (Device/Increase time)         General transfer comment: cues for hand placement for sitting in recliner  Ambulation/Gait Ambulation/Gait assistance: Modified independent (Device/Increase time) Gait Distance (Feet): 150 Feet Assistive device: Rolling walker (2 wheeled) Gait Pattern/deviations: Step-through pattern;Decreased stride length Gait velocity: decreased       Stairs Stairs: Yes Stairs assistance: Min guard Stair Management: Two rails;Forwards Number of Stairs: 6     Wheelchair Mobility    Modified Rankin (Stroke Patients  Only)       Balance Overall balance assessment: No apparent balance deficits (not formally assessed)                                          Cognition Arousal/Alertness: Awake/alert Behavior During Therapy: WFL for tasks assessed/performed Overall Cognitive Status: Within Functional Limits for tasks assessed                                        Exercises Total Joint Exercises Ankle Circles/Pumps: AROM;10 reps;Supine Quad Sets: AROM;Strengthening;Both;10 reps;Supine Heel Slides: Strengthening;Left;10 reps;AROM;Supine Hip ABduction/ADduction: AROM;Strengthening;Left;10 reps;Supine Knee Flexion: AROM;Strengthening;Left;Standing Standing Hip Extension: AROM;Strengthening;Left;Standing    General Comments General comments (skin integrity, edema, etc.): Pt recalled 3/3 hip precautions      Pertinent Vitals/Pain Pain Assessment: 0-10 Pain Score: 2  Pain Descriptors / Indicators: Discomfort Pain Intervention(s): Limited activity within patient's tolerance;Monitored during session    Home Living Family/patient expects to be discharged to:: Private residence Living Arrangements: Spouse/significant other Available Help at Discharge: Family Type of Home: House Home Access: Stairs to enter Entrance Stairs-Rails: Right;Left;Can reach both Home Layout: One level Home Equipment: WEnvironmental consultant- 2 wheels;Cane - single point      Prior Function Level of Independence: Independent          PT Goals (current goals can now be found in the care plan section) Acute Rehab PT Goals Patient Stated Goal: To go home PT Goal Formulation: With patient Time For Goal Achievement: 07/26/19 Potential to Achieve Goals: Good Progress towards PT goals:  Goals met/education completed, patient discharged from PT    Frequency    7X/week      PT Plan Current plan remains appropriate    Co-evaluation              AM-PAC PT "6 Clicks" Mobility   Outcome  Measure  Help needed turning from your back to your side while in a flat bed without using bedrails?: None Help needed moving from lying on your back to sitting on the side of a flat bed without using bedrails?: None Help needed moving to and from a bed to a chair (including a wheelchair)?: None Help needed standing up from a chair using your arms (e.g., wheelchair or bedside chair)?: None Help needed to walk in hospital room?: None Help needed climbing 3-5 steps with a railing? : A Little 6 Click Score: 23    End of Session Equipment Utilized During Treatment: Gait belt Activity Tolerance: Patient tolerated treatment well Patient left: in chair;with call bell/phone within reach;with family/visitor present Nurse Communication: Mobility status PT Visit Diagnosis: Difficulty in walking, not elsewhere classified (R26.2)     Time: 5537-4827 PT Time Calculation (min) (ACUTE ONLY): 29 min  Charges:  $Gait Training: 8-22 mins $Therapeutic Exercise: 8-22 mins                     Theodoro Grist, PT   Lelon Mast 07/19/2019, 12:49 PM

## 2019-07-19 NOTE — TOC Transition Note (Signed)
Transition of Care [facility]) - CM/SW Discharge Note   Patient Details  Name: Theresa Bradley MRN: [id number] Date of Birth: 1951/07/09  [facility] CM/SW Contact:  Leeroy Cha, RN Phone Number: 07/19/2019, 10:34 AM   Clinical Narrative:    dcd to home klto do HEP and has equipment.   Final next level of care: Home/Self Care Barriers to Discharge: No Barriers Identified   Patient Goals and CMS Choice Patient states their goals for this hospitalization and ongoing recovery are:: to go home and do my excercises correctly CMS Medicare.gov Compare Post Acute Care list provided to:: Patient Choice offered to / list presented to : Patient  Discharge Placement                       Discharge Plan and Services                                     Social Determinants of Health (SDOH) Interventions     Readmission Risk Interventions No flowsheet data found.

## 2019-07-23 LAB — AEROBIC/ANAEROBIC CULTURE W GRAM STAIN (SURGICAL/DEEP WOUND): Culture: NO GROWTH

## 2019-07-28 ENCOUNTER — Ambulatory Visit: Payer: Medicare Other

## 2019-07-29 DIAGNOSIS — J3081 Allergic rhinitis due to animal (cat) (dog) hair and dander: Secondary | ICD-10-CM | POA: Diagnosis not present

## 2019-07-29 DIAGNOSIS — J3089 Other allergic rhinitis: Secondary | ICD-10-CM | POA: Diagnosis not present

## 2019-07-29 DIAGNOSIS — J301 Allergic rhinitis due to pollen: Secondary | ICD-10-CM | POA: Diagnosis not present

## 2019-07-29 NOTE — Progress Notes (Signed)
Pt presents with mycotic infection of nails 1-5 bilateral  All other systems are negative  Laser therapy administered to affected nails and tolerated well. All safety precautions were in place. 4th treatment.  Follow up in 4 weeks     

## 2019-08-02 DIAGNOSIS — J301 Allergic rhinitis due to pollen: Secondary | ICD-10-CM | POA: Diagnosis not present

## 2019-08-02 DIAGNOSIS — J3081 Allergic rhinitis due to animal (cat) (dog) hair and dander: Secondary | ICD-10-CM | POA: Diagnosis not present

## 2019-08-04 ENCOUNTER — Encounter: Payer: Self-pay | Admitting: Podiatry

## 2019-08-04 ENCOUNTER — Other Ambulatory Visit: Payer: Self-pay

## 2019-08-04 ENCOUNTER — Ambulatory Visit (INDEPENDENT_AMBULATORY_CARE_PROVIDER_SITE_OTHER): Payer: Medicare Other | Admitting: Podiatry

## 2019-08-04 DIAGNOSIS — B351 Tinea unguium: Secondary | ICD-10-CM | POA: Diagnosis not present

## 2019-08-04 DIAGNOSIS — M79674 Pain in right toe(s): Secondary | ICD-10-CM

## 2019-08-04 DIAGNOSIS — M79675 Pain in left toe(s): Secondary | ICD-10-CM | POA: Diagnosis not present

## 2019-08-05 ENCOUNTER — Encounter: Payer: Self-pay | Admitting: Podiatry

## 2019-08-05 DIAGNOSIS — J3081 Allergic rhinitis due to animal (cat) (dog) hair and dander: Secondary | ICD-10-CM | POA: Diagnosis not present

## 2019-08-05 DIAGNOSIS — J301 Allergic rhinitis due to pollen: Secondary | ICD-10-CM | POA: Diagnosis not present

## 2019-08-05 NOTE — Progress Notes (Signed)
Subjective:   Patient ID: Theresa Bradley, female   DOB: 68 y.o.   MRN: [id number]   HPI Patient presents stating I have had several laser therapies and I wanted to get the nail checked and I had some redness around the cuticle and I was concerned about infection   ROS      Objective:  Physical Exam  Neurovascular status intact with patient's left hallux nail appearing to grow out with approximately the distal two thirds of the nailbed involved lateral side with no current redness or drainage noted     Assessment:  Possibility for previous paronychia infection with patient noted to have what appears to be moderate improvement of the left hallux nail with discoloration noted     Plan:  H&P discussed the possibility for continued pulse treatment for this and did educate her on different treatment options.  At this point we will do 2 more laser since she is scheduled for these and we will consider a second pulse treatment and also if any redness drainage were to occur she is to be seen immediately

## 2019-08-09 ENCOUNTER — Other Ambulatory Visit: Payer: Self-pay

## 2019-08-09 ENCOUNTER — Ambulatory Visit: Payer: Self-pay

## 2019-08-09 DIAGNOSIS — B351 Tinea unguium: Secondary | ICD-10-CM

## 2019-08-09 DIAGNOSIS — J3089 Other allergic rhinitis: Secondary | ICD-10-CM | POA: Diagnosis not present

## 2019-08-09 DIAGNOSIS — J3081 Allergic rhinitis due to animal (cat) (dog) hair and dander: Secondary | ICD-10-CM | POA: Diagnosis not present

## 2019-08-09 DIAGNOSIS — J301 Allergic rhinitis due to pollen: Secondary | ICD-10-CM | POA: Diagnosis not present

## 2019-08-12 ENCOUNTER — Encounter: Payer: Medicare Other | Admitting: Family Medicine

## 2019-08-12 DIAGNOSIS — J3081 Allergic rhinitis due to animal (cat) (dog) hair and dander: Secondary | ICD-10-CM | POA: Diagnosis not present

## 2019-08-12 DIAGNOSIS — J301 Allergic rhinitis due to pollen: Secondary | ICD-10-CM | POA: Diagnosis not present

## 2019-08-15 ENCOUNTER — Other Ambulatory Visit: Payer: Self-pay | Admitting: Family Medicine

## 2019-08-15 ENCOUNTER — Encounter: Payer: Self-pay | Admitting: Family Medicine

## 2019-08-15 ENCOUNTER — Other Ambulatory Visit: Payer: Self-pay

## 2019-08-15 ENCOUNTER — Ambulatory Visit (INDEPENDENT_AMBULATORY_CARE_PROVIDER_SITE_OTHER): Payer: Medicare Other | Admitting: Family Medicine

## 2019-08-15 VITALS — BP 120/68 | HR 70 | Temp 97.3°F | Ht 63.75 in | Wt 164.1 lb

## 2019-08-15 DIAGNOSIS — Z0000 Encounter for general adult medical examination without abnormal findings: Secondary | ICD-10-CM | POA: Diagnosis not present

## 2019-08-15 DIAGNOSIS — E039 Hypothyroidism, unspecified: Secondary | ICD-10-CM

## 2019-08-15 DIAGNOSIS — C439 Malignant melanoma of skin, unspecified: Secondary | ICD-10-CM

## 2019-08-15 DIAGNOSIS — Z23 Encounter for immunization: Secondary | ICD-10-CM | POA: Diagnosis not present

## 2019-08-15 LAB — TSH: TSH: 0.75 u[IU]/mL (ref 0.35–4.50)

## 2019-08-15 NOTE — Patient Instructions (Signed)

## 2019-08-15 NOTE — Addendum Note (Signed)
Addended by: Anibal Henderson on: 08/15/2019 03:03 PM   Modules accepted: Orders

## 2019-08-15 NOTE — Progress Notes (Signed)
Subjective:     Patient ID: Theresa Bradley, female   DOB: [birth year], 68 y.o.   MRN: [id number]  HPI   Patient is seen for physical exam.  She had recent left hip surgery for failed arthroplasty.  She did well.  She has finished her rehab but is ambulating with a cane.  She did have diagnosis of melanoma months ago and is getting every six-month follow-up with dermatology for that.  She has hypothyroidism and is overdue for labs.  She has hypertension which is well controlled.  Health maintenance reviewed:  -Needs flu vaccine -Tetanus due next year -She had Cologuard and will be due for repeat studies 2023 -She had mammogram January of this year -Pneumovax complete -Shingles vaccine already given  Past Medical History:  Diagnosis Date  . Anxiety   . Breast cancer ([place]) 2009   right  . Cancer ([place])   . COUGH, CHRONIC 05/10/2010  . Dysrhythmia    occationally "skips a beat"  . HYPERTENSION 04/16/2009  . HYPOTHYROIDISM 04/16/2009  . OSTEOARTHRITIS 04/16/2009  . Personal history of radiation therapy   . SENILE LENTIGO 06/10/2010   Past Surgical History:  Procedure Laterality Date  . BREAST EXCISIONAL BIOPSY Right   . BREAST EXCISIONAL BIOPSY Left   . BREAST LUMPECTOMY Right    radiation only  . BREAST SURGERY  2009   X 2, cancer stage 0, lumpectomy  . [place]  . CHOLECYSTECTOMY    . DILATATION & CURETTAGE/HYSTEROSCOPY WITH MYOSURE  11/2015  . HERNIA REPAIR  2009  . JOINT REPLACEMENT  2008   hip  . OPEN SURGICAL REPAIR OF GLUTEAL TENDON Left 07/18/2019   Procedure: Left hip bearing surface revision with gluteal tendon repair;  Surgeon: Gaynelle Arabian, MD;  Location: [facility];  Service: Orthopedics;  Laterality: Left;  2 hrs    reports that she has never smoked. She has never used smokeless tobacco. She reports current alcohol use. She reports that she does not use drugs. family history includes Arthritis in an other family member; Cancer in an other  family member; Dementia in her father; Hyperlipidemia in her father; Hypertension in an other family member. Allergies  Allergen Reactions  . Codeine Sulfate Nausea And Vomiting  . Erythromycin Base Other (See Comments)    Stomach ache  . Esomeprazole Magnesium     REACTION: GI upset      Review of Systems  Constitutional: Negative for activity change, appetite change, fatigue, fever and unexpected weight change.  HENT: Negative for ear pain, hearing loss, sore throat and trouble swallowing.   Eyes: Negative for visual disturbance.  Respiratory: Negative for cough and shortness of breath.   Cardiovascular: Negative for chest pain and palpitations.  Gastrointestinal: Negative for abdominal pain, blood in stool, constipation and diarrhea.  Genitourinary: Negative for dysuria and hematuria.  Musculoskeletal: Negative for back pain and myalgias.  Skin: Negative for rash.  Neurological: Negative for dizziness, syncope and headaches.  Hematological: Negative for adenopathy.  Psychiatric/Behavioral: Negative for confusion and dysphoric mood.       Objective:   Physical Exam Constitutional:      Appearance: She is well-developed.  HENT:     Head: Normocephalic and atraumatic.  Eyes:     Pupils: Pupils are equal, round, and reactive to light.  Neck:     Musculoskeletal: Normal range of motion and neck supple.     Thyroid: No thyromegaly.  Cardiovascular:     Rate and  Rhythm: Normal rate and regular rhythm.     Heart sounds: Normal heart sounds. No murmur.  Pulmonary:     Effort: No respiratory distress.     Breath sounds: Normal breath sounds. No wheezing or rales.  Abdominal:     General: Bowel sounds are normal. There is no distension.     Palpations: Abdomen is soft. There is no mass.     Tenderness: There is no abdominal tenderness. There is no guarding or rebound.     Hernia: No hernia is present.     Comments: Incisional scar from previous cholecystectomy.  No definite  hernia palpated  She has some mild abdominal wall asymmetry with slight prominence on the right but no appreciable masses or hernia  Musculoskeletal: Normal range of motion.  Lymphadenopathy:     Cervical: No cervical adenopathy.  Skin:    Findings: No rash.  Neurological:     Mental Status: She is alert and oriented to person, place, and time.     Cranial Nerves: No cranial nerve deficit.     Deep Tendon Reflexes: Reflexes normal.  Psychiatric:        Behavior: Behavior normal.        Thought Content: Thought content normal.        Judgment: Judgment normal.        Assessment:     Physical exam.  Patient had recent left hip surgery which went well.  She has hypothyroidism and is overdue for labs.  Hypertension stable and at goal.  Recent diagnosis of melanoma as above    Plan:     -Flu vaccine given -Obtain TSH follow-up -She will continue with yearly mammograms and continue to see dermatology every 6 months for now  [name] MD [facility] at [facility]

## 2019-08-16 NOTE — Telephone Encounter (Signed)
Last OV yesterday, No future OV  Last filled 09/14/18, # 45 with 0 refills

## 2019-08-18 ENCOUNTER — Other Ambulatory Visit: Payer: Self-pay | Admitting: Family Medicine

## 2019-08-21 ENCOUNTER — Other Ambulatory Visit: Payer: Self-pay | Admitting: Family Medicine

## 2019-08-23 DIAGNOSIS — Z471 Aftercare following joint replacement surgery: Secondary | ICD-10-CM | POA: Diagnosis not present

## 2019-08-23 DIAGNOSIS — Z96642 Presence of left artificial hip joint: Secondary | ICD-10-CM | POA: Diagnosis not present

## 2019-08-24 DIAGNOSIS — J301 Allergic rhinitis due to pollen: Secondary | ICD-10-CM | POA: Diagnosis not present

## 2019-08-24 DIAGNOSIS — J3089 Other allergic rhinitis: Secondary | ICD-10-CM | POA: Diagnosis not present

## 2019-08-24 DIAGNOSIS — J3081 Allergic rhinitis due to animal (cat) (dog) hair and dander: Secondary | ICD-10-CM | POA: Diagnosis not present

## 2019-08-24 NOTE — Telephone Encounter (Signed)
Refill for 1 year 

## 2019-08-24 NOTE — Telephone Encounter (Signed)
Please advise. This has not been filled in over 1 year. Last OV was 07/2019

## 2019-08-30 NOTE — Progress Notes (Signed)
Pt presents with mycotic infection of nails 1-5 bilateral All other systems are negative  Laser therapy administered to affected nails and tolerated well. All safety precautions were in place. 5th treatment.  Follow up in 4 weeks     

## 2019-09-05 DIAGNOSIS — H5203 Hypermetropia, bilateral: Secondary | ICD-10-CM | POA: Diagnosis not present

## 2019-09-05 DIAGNOSIS — H2513 Age-related nuclear cataract, bilateral: Secondary | ICD-10-CM | POA: Diagnosis not present

## 2019-09-05 DIAGNOSIS — H40013 Open angle with borderline findings, low risk, bilateral: Secondary | ICD-10-CM | POA: Diagnosis not present

## 2019-09-05 DIAGNOSIS — H5010 Unspecified exotropia: Secondary | ICD-10-CM | POA: Diagnosis not present

## 2019-09-07 DIAGNOSIS — J3089 Other allergic rhinitis: Secondary | ICD-10-CM | POA: Diagnosis not present

## 2019-09-07 DIAGNOSIS — J301 Allergic rhinitis due to pollen: Secondary | ICD-10-CM | POA: Diagnosis not present

## 2019-09-07 DIAGNOSIS — J3081 Allergic rhinitis due to animal (cat) (dog) hair and dander: Secondary | ICD-10-CM | POA: Diagnosis not present

## 2019-09-13 ENCOUNTER — Other Ambulatory Visit: Payer: Self-pay | Admitting: Family Medicine

## 2019-09-16 DIAGNOSIS — H40013 Open angle with borderline findings, low risk, bilateral: Secondary | ICD-10-CM | POA: Diagnosis not present

## 2019-09-16 NOTE — Telephone Encounter (Signed)
The original prescription was discontinued on 07/19/2019 by Ardeen Jourdain, PA-C for the following reason: Stop Taking at Discharge. Renewing this prescription may not be appropriate.

## 2019-09-21 DIAGNOSIS — J3081 Allergic rhinitis due to animal (cat) (dog) hair and dander: Secondary | ICD-10-CM | POA: Diagnosis not present

## 2019-09-21 DIAGNOSIS — J301 Allergic rhinitis due to pollen: Secondary | ICD-10-CM | POA: Diagnosis not present

## 2019-09-21 DIAGNOSIS — J3089 Other allergic rhinitis: Secondary | ICD-10-CM | POA: Diagnosis not present

## 2019-10-04 DIAGNOSIS — J301 Allergic rhinitis due to pollen: Secondary | ICD-10-CM | POA: Diagnosis not present

## 2019-10-04 DIAGNOSIS — J3081 Allergic rhinitis due to animal (cat) (dog) hair and dander: Secondary | ICD-10-CM | POA: Diagnosis not present

## 2019-10-04 DIAGNOSIS — J3089 Other allergic rhinitis: Secondary | ICD-10-CM | POA: Diagnosis not present

## 2019-10-17 ENCOUNTER — Encounter: Payer: Self-pay | Admitting: Family Medicine

## 2019-10-17 MED ORDER — MELOXICAM 15 MG PO TABS: 15.0000 mg | ORAL_TABLET | Freq: Every day | ORAL | 1 refills | Status: DC

## 2019-10-17 NOTE — Telephone Encounter (Signed)
I sent refill Mobic to pharmacy.

## 2019-10-19 DIAGNOSIS — J3081 Allergic rhinitis due to animal (cat) (dog) hair and dander: Secondary | ICD-10-CM | POA: Diagnosis not present

## 2019-10-19 DIAGNOSIS — J3089 Other allergic rhinitis: Secondary | ICD-10-CM | POA: Diagnosis not present

## 2019-10-19 DIAGNOSIS — J301 Allergic rhinitis due to pollen: Secondary | ICD-10-CM | POA: Diagnosis not present

## 2019-10-26 DIAGNOSIS — J301 Allergic rhinitis due to pollen: Secondary | ICD-10-CM | POA: Diagnosis not present

## 2019-10-26 DIAGNOSIS — J3081 Allergic rhinitis due to animal (cat) (dog) hair and dander: Secondary | ICD-10-CM | POA: Diagnosis not present

## 2019-10-26 DIAGNOSIS — J3089 Other allergic rhinitis: Secondary | ICD-10-CM | POA: Diagnosis not present

## 2019-11-08 DIAGNOSIS — J301 Allergic rhinitis due to pollen: Secondary | ICD-10-CM | POA: Diagnosis not present

## 2019-11-08 DIAGNOSIS — J3089 Other allergic rhinitis: Secondary | ICD-10-CM | POA: Diagnosis not present

## 2019-11-08 DIAGNOSIS — J3081 Allergic rhinitis due to animal (cat) (dog) hair and dander: Secondary | ICD-10-CM | POA: Diagnosis not present

## 2019-11-24 DIAGNOSIS — J301 Allergic rhinitis due to pollen: Secondary | ICD-10-CM | POA: Diagnosis not present

## 2019-11-24 DIAGNOSIS — J3089 Other allergic rhinitis: Secondary | ICD-10-CM | POA: Diagnosis not present

## 2019-11-24 DIAGNOSIS — J3081 Allergic rhinitis due to animal (cat) (dog) hair and dander: Secondary | ICD-10-CM | POA: Diagnosis not present

## 2019-11-25 DIAGNOSIS — Z9221 Personal history of antineoplastic chemotherapy: Secondary | ICD-10-CM

## 2019-11-25 HISTORY — DX: Personal history of antineoplastic chemotherapy: Z92.21

## 2019-11-25 HISTORY — PX: MASTECTOMY: SHX3

## 2019-12-06 ENCOUNTER — Other Ambulatory Visit: Payer: Self-pay | Admitting: Family Medicine

## 2019-12-07 MED ORDER — ALPRAZOLAM 0.25 MG PO TABS: ORAL_TABLET | ORAL | 0 refills | Status: DC

## 2019-12-08 DIAGNOSIS — J3081 Allergic rhinitis due to animal (cat) (dog) hair and dander: Secondary | ICD-10-CM | POA: Diagnosis not present

## 2019-12-08 DIAGNOSIS — J3089 Other allergic rhinitis: Secondary | ICD-10-CM | POA: Diagnosis not present

## 2019-12-08 DIAGNOSIS — J301 Allergic rhinitis due to pollen: Secondary | ICD-10-CM | POA: Diagnosis not present

## 2019-12-14 ENCOUNTER — Encounter: Payer: Self-pay | Admitting: Family Medicine

## 2019-12-15 ENCOUNTER — Other Ambulatory Visit: Payer: Self-pay

## 2019-12-16 ENCOUNTER — Encounter: Payer: Self-pay | Admitting: Family Medicine

## 2019-12-16 ENCOUNTER — Ambulatory Visit (INDEPENDENT_AMBULATORY_CARE_PROVIDER_SITE_OTHER): Payer: Medicare Other | Admitting: Family Medicine

## 2019-12-16 VITALS — BP 130/82 | HR 51 | Temp 97.3°F | Wt 158.4 lb

## 2019-12-16 DIAGNOSIS — Z853 Personal history of malignant neoplasm of breast: Secondary | ICD-10-CM

## 2019-12-16 DIAGNOSIS — N6310 Unspecified lump in the right breast, unspecified quadrant: Secondary | ICD-10-CM

## 2019-12-16 NOTE — Patient Instructions (Signed)
I will set up diagnostic mammogram and ultrasound if indicated for [facility].  Let me know if you have not been contacted in one week.

## 2019-12-16 NOTE — Progress Notes (Signed)
Subjective:     Patient ID: Theresa Bradley, female   DOB: [birth year], 69 y.o.   MRN: [id number]  HPI Theresa Bradley had called to set up her mammogram recently and she explained that she had noticed a small lump in her right breast and she was requesting a diagnostic mammogram.  She did not realize that she needed referral for that.  Her history is that she has had prior right breast cancer in situ several years ago with lumpectomy and radiation therapy.  She was then diagnosed by her dermatologist several months ago with superficial melanoma right lateral breast.  She had wide excision with no residual tumor.  She noted small slightly tender "lump right breast around 11 o'clock position just superior and lateral to the nipple.  No nipple discharge.  No axillary adenopathy.  No recent significant appetite change.  She has lost some weight apparently due to her efforts  Wt Readings from Last 3 Encounters:  12/16/19 158 lb 6.4 oz (71.8 kg)  08/15/19 164 lb 1.6 oz (74.4 kg)  07/18/19 169 lb 1.5 oz (76.7 kg)     Past Medical History:  Diagnosis Date  . Anxiety   . Breast cancer ([place]) 2009   right  . Cancer ([place])   . COUGH, CHRONIC 05/10/2010  . Dysrhythmia    occationally "skips a beat"  . HYPERTENSION 04/16/2009  . HYPOTHYROIDISM 04/16/2009  . OSTEOARTHRITIS 04/16/2009  . Personal history of radiation therapy   . SENILE LENTIGO 06/10/2010   Past Surgical History:  Procedure Laterality Date  . BREAST EXCISIONAL BIOPSY Right   . BREAST EXCISIONAL BIOPSY Left   . BREAST LUMPECTOMY Right    radiation only  . BREAST SURGERY  2009   X 2, cancer stage 0, lumpectomy  . [place]  . CHOLECYSTECTOMY    . DILATATION & CURETTAGE/HYSTEROSCOPY WITH MYOSURE  11/2015  . HERNIA REPAIR  2009  . JOINT REPLACEMENT  2008   hip  . OPEN SURGICAL REPAIR OF GLUTEAL TENDON Left 07/18/2019   Procedure: Left hip bearing surface revision with gluteal tendon repair;  Surgeon: Gaynelle Arabian,  MD;  Location: [facility];  Service: Orthopedics;  Laterality: Left;  2 hrs    reports that she has never smoked. She has never used smokeless tobacco. She reports current alcohol use. She reports that she does not use drugs. family history includes Arthritis in an other family member; Cancer in an other family member; Dementia in her father; Hyperlipidemia in her father; Hypertension in an other family member. Allergies  Allergen Reactions  . Codeine Sulfate Nausea And Vomiting  . Erythromycin Base Other (See Comments)    Stomach ache  . Esomeprazole Magnesium     REACTION: GI upset     Review of Systems  Constitutional: Negative for appetite change, chills, fever and unexpected weight change.  Respiratory: Negative for cough and shortness of breath.   Cardiovascular: Negative for chest pain.  Hematological: Negative for adenopathy.       Objective:   Physical Exam Vitals reviewed.  Constitutional:      Appearance: Normal appearance.  Cardiovascular:     Rate and Rhythm: Normal rate and regular rhythm.  Pulmonary:     Comments: Right breast reveals scar from previous lumpectomy.  She also has right lateral breast scar from recent melanoma excision.  She has some mild erythematous dry eczematous change involving part of the areola-near the lump in question..  She has approximately 1  x 1 cm firm nonfluctuant slightly tender mass in the region of the areola around 11:00 just superior and lateral to the nipple.  No nipple inversion.  No nipple discharge.  No axillary adenopathy.  No other masses noted. Neurological:     Mental Status: She is alert.        Assessment:     Small right breast lump.  Recent history of superficial melanoma excision right lateral breast and history several years ago of breast cancer in situ with previous lumpectomy and radiation therapy to the right breast    Plan:     -Set up diagnostic mammogram and ultrasound of indicated right breast  [name] MD [facility] at [facility]

## 2019-12-18 ENCOUNTER — Ambulatory Visit: Payer: Medicare Other | Attending: Internal Medicine

## 2019-12-18 DIAGNOSIS — Z23 Encounter for immunization: Secondary | ICD-10-CM | POA: Insufficient documentation

## 2019-12-18 NOTE — Progress Notes (Signed)
   Covid-19 Vaccination Clinic  Name:  Theresa Bradley    MRN: [id number] DOB: [birth year]  12/18/2019  Theresa Bradley was observed post Covid-19 immunization for 15 minutes without incidence. She was provided with Vaccine Information Sheet and instruction to access the V-Safe system.   Theresa Bradley was instructed to call 911 with any severe reactions post vaccine: Marland Kitchen Difficulty breathing  . Swelling of your face and throat  . A fast heartbeat  . A bad rash all over your body  . Dizziness and weakness    Immunizations Administered    Name Date Dose VIS Date Route   Pfizer COVID-19 Vaccine 12/18/2019 11:08 AM 0.3 mL 11/04/2019 Intramuscular   Manufacturer: [place]   Lot: [id number]   [place]: [id number]

## 2019-12-21 DIAGNOSIS — J3081 Allergic rhinitis due to animal (cat) (dog) hair and dander: Secondary | ICD-10-CM | POA: Diagnosis not present

## 2019-12-21 DIAGNOSIS — J301 Allergic rhinitis due to pollen: Secondary | ICD-10-CM | POA: Diagnosis not present

## 2019-12-21 DIAGNOSIS — J3089 Other allergic rhinitis: Secondary | ICD-10-CM | POA: Diagnosis not present

## 2019-12-26 ENCOUNTER — Ambulatory Visit
Admission: RE | Admit: 2019-12-26 | Discharge: 2019-12-26 | Disposition: A | Discharge status: Home / Self Care | Payer: Medicare Other | Source: Ambulatory Visit | Attending: Family Medicine | Admitting: Family Medicine

## 2019-12-26 ENCOUNTER — Other Ambulatory Visit: Payer: Self-pay

## 2019-12-26 ENCOUNTER — Other Ambulatory Visit: Payer: Self-pay | Admitting: Family Medicine

## 2019-12-26 DIAGNOSIS — R928 Other abnormal and inconclusive findings on diagnostic imaging of breast: Secondary | ICD-10-CM | POA: Diagnosis not present

## 2019-12-26 DIAGNOSIS — N6310 Unspecified lump in the right breast, unspecified quadrant: Secondary | ICD-10-CM

## 2019-12-26 DIAGNOSIS — N6311 Unspecified lump in the right breast, upper outer quadrant: Secondary | ICD-10-CM | POA: Diagnosis not present

## 2019-12-30 ENCOUNTER — Other Ambulatory Visit: Payer: Self-pay

## 2019-12-30 ENCOUNTER — Ambulatory Visit
Admission: RE | Admit: 2019-12-30 | Discharge: 2019-12-30 | Disposition: A | Discharge status: Home / Self Care | Payer: Medicare Other | Source: Ambulatory Visit | Attending: Family Medicine | Admitting: Family Medicine

## 2019-12-30 DIAGNOSIS — N6310 Unspecified lump in the right breast, unspecified quadrant: Secondary | ICD-10-CM

## 2019-12-30 DIAGNOSIS — N6341 Unspecified lump in right breast, subareolar: Secondary | ICD-10-CM | POA: Diagnosis not present

## 2019-12-30 DIAGNOSIS — C50111 Malignant neoplasm of central portion of right female breast: Secondary | ICD-10-CM | POA: Diagnosis not present

## 2020-01-03 ENCOUNTER — Other Ambulatory Visit: Payer: Medicare Other

## 2020-01-04 ENCOUNTER — Encounter: Payer: Self-pay | Admitting: *Deleted

## 2020-01-04 DIAGNOSIS — D225 Melanocytic nevi of trunk: Secondary | ICD-10-CM | POA: Diagnosis not present

## 2020-01-04 DIAGNOSIS — Z85820 Personal history of malignant melanoma of skin: Secondary | ICD-10-CM | POA: Diagnosis not present

## 2020-01-04 DIAGNOSIS — J3081 Allergic rhinitis due to animal (cat) (dog) hair and dander: Secondary | ICD-10-CM | POA: Diagnosis not present

## 2020-01-04 DIAGNOSIS — L821 Other seborrheic keratosis: Secondary | ICD-10-CM | POA: Diagnosis not present

## 2020-01-04 DIAGNOSIS — D2261 Melanocytic nevi of right upper limb, including shoulder: Secondary | ICD-10-CM | POA: Diagnosis not present

## 2020-01-04 DIAGNOSIS — J301 Allergic rhinitis due to pollen: Secondary | ICD-10-CM | POA: Diagnosis not present

## 2020-01-04 DIAGNOSIS — C50411 Malignant neoplasm of upper-outer quadrant of right female breast: Secondary | ICD-10-CM | POA: Insufficient documentation

## 2020-01-04 DIAGNOSIS — J3089 Other allergic rhinitis: Secondary | ICD-10-CM | POA: Diagnosis not present

## 2020-01-05 DIAGNOSIS — Z96642 Presence of left artificial hip joint: Secondary | ICD-10-CM | POA: Diagnosis not present

## 2020-01-05 DIAGNOSIS — Z471 Aftercare following joint replacement surgery: Secondary | ICD-10-CM | POA: Diagnosis not present

## 2020-01-09 ENCOUNTER — Ambulatory Visit: Payer: Medicare Other | Attending: Internal Medicine

## 2020-01-09 DIAGNOSIS — Z23 Encounter for immunization: Secondary | ICD-10-CM | POA: Insufficient documentation

## 2020-01-09 NOTE — Progress Notes (Signed)
   Covid-19 Vaccination Clinic  Name:  SAPHIRA MESSANA    MRN: [id number] DOB: Mar 14, 1951  01/09/2020  Ms. Rosenblum was observed post Covid-19 immunization for 15 minutes without incidence. She was provided with Vaccine Information Sheet and instruction to access the V-Safe system.   Ms. Mcclaran was instructed to call 911 with any severe reactions post vaccine: Marland Kitchen Difficulty breathing  . Swelling of your face and throat  . A fast heartbeat  . A bad rash all over your body  . Dizziness and weakness    Immunizations Administered    Name Date Dose VIS Date Route   Pfizer COVID-19 Vaccine 01/09/2020 12:16 PM 0.3 mL 11/04/2019 Intramuscular   Manufacturer: [place]   Lot: [id number]   [place]: [id number]

## 2020-01-10 NOTE — Progress Notes (Signed)
Radiation Oncology         [phone] ________________________________  Initial outpatient Consultation in person  Name: Theresa Bradley MRN: [id number]  Date: 01/11/2020  DOB: 1951/09/04  PX:[id number], [name], MD  [name], MD   REFERRING PHYSICIAN: [name], MD  DIAGNOSIS:    ICD-10-CM   1. Malignant neoplasm of upper-outer quadrant of right breast in female, estrogen receptor positive ([place])  C50.411    Z17.0    Cancer Staging Malignant neoplasm of upper-outer quadrant of right breast in female, estrogen receptor positive ([place]) Staging form: Breast, AJCC 8th Edition - Clinical stage from 01/11/2020: Stage IA (cT1b, cN0, cM0, G2, ER+, PR+, HER2+) - Unsigned    CHIEF COMPLAINT: Here to discuss management of right breast cancer  HISTORY OF PRESENT ILLNESS::Theresa Bradley is a 69 y.o. female who has a history of right breast DCIS in 2009, s/p lumpectomy and radiation therapy. The patient recalls that her physician in radiation oncology at that time was Dr. Valere Dross.  She presented with palpable, developing mass in right breast at 11 o'clock in the areolar region. Ultrasound of breast on 12/26/2019 revealed 6 mm right breast mass at 11 o'clock.   Biopsy on date of 12/30/2019 showed invasive ductal carcinoma.  ER status: 95% strongly positive; PR status 80% strongly positive, Her2 status positive; Grade 2.  She is in her usual state of health otherwise.  I reviewed her imaging at breast tumor board this morning.  PREVIOUS RADIATION THERAPY: Yes  2009: Right breast (Dr. Valere Dross)  PAST MEDICAL HISTORY:  has a past medical history of Anxiety, Breast cancer ([place]) (2009), Cancer ([place]), COUGH, CHRONIC (05/10/2010), Dysrhythmia, GERD (gastroesophageal reflux disease), HYPERTENSION (04/16/2009), HYPOTHYROIDISM (04/16/2009), OSTEOARTHRITIS (04/16/2009), Personal history of radiation therapy, and SENILE LENTIGO (06/10/2010).    PAST SURGICAL HISTORY: Past Surgical History:  Procedure  Laterality Date  . BREAST EXCISIONAL BIOPSY Right   . BREAST EXCISIONAL BIOPSY Left   . BREAST LUMPECTOMY Right 2009   radiation only  . BREAST SURGERY  2009   X 2, cancer stage 0, lumpectomy  . [place]  . CHOLECYSTECTOMY    . DILATATION & CURETTAGE/HYSTEROSCOPY WITH MYOSURE  11/2015  . HERNIA REPAIR  2009  . JOINT REPLACEMENT  2008   hip  . OPEN SURGICAL REPAIR OF GLUTEAL TENDON Left 07/18/2019   Procedure: Left hip bearing surface revision with gluteal tendon repair;  Surgeon: Gaynelle Arabian, MD;  Location: [facility];  Service: Orthopedics;  Laterality: Left;  2 hrs    FAMILY HISTORY: family history includes Arthritis in an other family member; Breast cancer in her maternal aunt and maternal aunt; Cancer in an other family member; Dementia in her father; Hyperlipidemia in her father; Hypertension in an other family member; Lung cancer in her paternal uncle.  SOCIAL HISTORY:  reports that she has never smoked. She has never used smokeless tobacco. She reports current alcohol use. She reports that she does not use drugs.  ALLERGIES: Codeine sulfate, Erythromycin base, and Esomeprazole magnesium  MEDICATIONS:  Current Outpatient Medications  Medication Sig Dispense Refill  . ALPRAZolam (XANAX) 0.25 MG tablet Take one half tablet po qhs prn insomnia 45 tablet 0  . amLODipine (NORVASC) 5 MG tablet Take 1 tablet (5 mg total) by mouth daily. 90 tablet 3  . Calcium Carbonate-Vitamin D 600-200 MG-UNIT TABS Take 1 tablet by mouth daily.    . citalopram (CELEXA) 20 MG tablet Take 1 tablet (20 mg total) by  mouth daily. 90 tablet 3  . docusate sodium (COLACE) 100 MG capsule Take 100 mg by mouth daily as needed for mild constipation.     Marland Kitchen EPIPEN 2-PAK 0.3 MG/0.3ML SOAJ injection Inject 0.3 mg into the muscle as needed for anaphylaxis.     . famotidine (PEPCID) 10 MG tablet Take 10 mg by mouth daily.     Marland Kitchen HYDROcodone-acetaminophen (NORCO/VICODIN) 5-325 MG tablet Take 1-2  tablets by mouth every 6 (six) hours as needed for moderate pain. (Patient not taking: Reported on 12/16/2019) 56 tablet 0  . levothyroxine (SYNTHROID) 50 MCG tablet TAKE 1 TABLET BY MOUTH EVERY DAY 90 tablet 1  . meloxicam (MOBIC) 15 MG tablet Take 1 tablet (15 mg total) by mouth daily. 90 tablet 1  . mupirocin ointment (BACTROBAN) 2 % APPLY TO AFFECTED AREA EVERY DAY    . tretinoin (RETIN-A) 0.025 % cream Apply topically at bedtime. (Patient not taking: Reported on 01/11/2020) 45 g 6   No current facility-administered medications for this encounter.    REVIEW OF SYSTEMS: As above  PHYSICAL EXAM:  vitals were not taken for this visit.   General: Alert and oriented, in no acute distress Breasts: Modest nipple inversion bilaterally.  There is a subcentimeter area of firmness at the 11:00 region of the right areola. No other palpable masses appreciated in the breasts or axillae bilaterally.  Skin exam notable for tattoos from previous right breast radiation set up; and, lumpectomy scar, right breast.  ECOG = 0  0 - Asymptomatic (Fully active, able to carry on all predisease activities without restriction)  1 - Symptomatic but completely ambulatory (Restricted in physically strenuous activity but ambulatory and able to carry out work of a light or sedentary nature. For example, light housework, office work)  2 - Symptomatic, <50% in bed during the day (Ambulatory and capable of all self care but unable to carry out any work activities. Up and about more than 50% of waking hours)  3 - Symptomatic, >50% in bed, but not bedbound (Capable of only limited self-care, confined to bed or chair 50% or more of waking hours)  4 - Bedbound (Completely disabled. Cannot carry on any self-care. Totally confined to bed or chair)  5 - Death   Eustace Pen MM, Creech RH, Tormey DC, et al. [phone]). "Toxicity and response criteria of the [facility]". [place] Oncol. 5 (6):  649-55   LABORATORY DATA:  Lab Results  Component Value Date   WBC 5.1 01/11/2020   HGB 13.9 01/11/2020   HCT 42.4 01/11/2020   MCV 89.5 01/11/2020   PLT 308 01/11/2020   CMP     Component Value Date/Time   NA 142 01/11/2020 1209   K 3.2 (L) 01/11/2020 1209   CL 103 01/11/2020 1209   CO2 29 01/11/2020 1209   GLUCOSE 116 (H) 01/11/2020 1209   BUN 10 01/11/2020 1209   CREATININE 0.81 01/11/2020 1209   CALCIUM 9.0 01/11/2020 1209   PROT 7.6 01/11/2020 1209   ALBUMIN 3.8 01/11/2020 1209   AST 14 (L) 01/11/2020 1209   ALT 13 01/11/2020 1209   ALKPHOS 76 01/11/2020 1209   BILITOT 0.5 01/11/2020 1209   GFRNONAA >60 01/11/2020 1209   GFRAA >60 01/11/2020 1209         RADIOGRAPHY: US BREAST LTD UNI RIGHT INC AXILLA  Result Date: 12/26/2019 CLINICAL DATA:  History of right breast cancer status post lumpectomy in 2009. Patient complains of a developing mass in  the 11 o'clock retroareolar region of the right breast. EXAM: DIGITAL DIAGNOSTIC BILATERAL MAMMOGRAM WITH CAD AND TOMO ULTRASOUND RIGHT BREAST COMPARISON:  Previous exam(s). ACR Breast Density Category b: There are scattered areas of fibroglandular density. FINDINGS: No suspicious mass or malignant type microcalcifications identified in the left breast. Lumpectomy changes are seen in the right breast. A radiopaque BB marks the palpable abnormality in the right breast. There is a developing superficial asymmetry in this area. There are no malignant type microcalcifications. Mammographic images were processed with CAD. On physical exam, I palpate a discrete superficial mass in the right breast at 11 o'clock in the retroareolar region. Targeted ultrasound is performed, showing an irregular superficial hypoechoic mass in the right breast 11 o'clock in the retroareolar region measuring 6 x 6 x 5 mm. Sonographic evaluation of the right axilla does not show any enlarged adenopathy. IMPRESSION: Suspicious mass in the 11 o'clock region of the  right breast. RECOMMENDATION: Ultrasound-guided core biopsy of the mass in the 11 o'clock region of the right breast is recommended. I have discussed the findings and recommendations with the patient. If applicable, a reminder letter will be sent to the patient regarding the next appointment. BI-RADS CATEGORY  4: Suspicious. Electronically Signed   By: Lillia Mountain M.D.   On: 12/26/2019 15:39   MM DIAG BREAST TOMO BILATERAL  Result Date: 12/26/2019 CLINICAL DATA:  History of right breast cancer status post lumpectomy in 2009. Patient complains of a developing mass in the 11 o'clock retroareolar region of the right breast. EXAM: DIGITAL DIAGNOSTIC BILATERAL MAMMOGRAM WITH CAD AND TOMO ULTRASOUND RIGHT BREAST COMPARISON:  Previous exam(s). ACR Breast Density Category b: There are scattered areas of fibroglandular density. FINDINGS: No suspicious mass or malignant type microcalcifications identified in the left breast. Lumpectomy changes are seen in the right breast. A radiopaque BB marks the palpable abnormality in the right breast. There is a developing superficial asymmetry in this area. There are no malignant type microcalcifications. Mammographic images were processed with CAD. On physical exam, I palpate a discrete superficial mass in the right breast at 11 o'clock in the retroareolar region. Targeted ultrasound is performed, showing an irregular superficial hypoechoic mass in the right breast 11 o'clock in the retroareolar region measuring 6 x 6 x 5 mm. Sonographic evaluation of the right axilla does not show any enlarged adenopathy. IMPRESSION: Suspicious mass in the 11 o'clock region of the right breast. RECOMMENDATION: Ultrasound-guided core biopsy of the mass in the 11 o'clock region of the right breast is recommended. I have discussed the findings and recommendations with the patient. If applicable, a reminder letter will be sent to the patient regarding the next appointment. BI-RADS CATEGORY  4:  Suspicious. Electronically Signed   By: Lillia Mountain M.D.   On: 12/26/2019 15:39   MM CLIP PLACEMENT RIGHT  Result Date: 12/30/2019 CLINICAL DATA:  Confirmation of clip placement after ultrasound-guided core needle biopsy of an indeterminate focal asymmetry/mass in the UPPER OUTER subareolar location, underlying a skin lesion involving the UPPER OUTER RIGHT areola, at the anterior margin of the lumpectomy scar. EXAM: 2D AND TOMOSYNTHESIS DIAGNOSTIC RIGHT MAMMOGRAM POST ULTRASOUND BIOPSY COMPARISON:  Previous exam(s). FINDINGS: Tomosynthesis and synthesized full field CC and mediolateral images were obtained following ultrasound guided biopsy of a mass in the UPPER OUTER subareolar RIGHT breast. The ribbon shaped tissue marking clip is appropriately position at the site of the biopsied focal asymmetry/mass. Expected post biopsy changes are present without evidence of hematoma. IMPRESSION: Appropriate positioning  of the ribbon shaped biopsy marking clip at the site of the biopsied focal asymmetry/mass in the UPPER OUTER subareolar RIGHT breast. Final Assessment: Post Procedure Mammograms for Marker Placement Electronically Signed   By: Evangeline Dakin M.D.   On: 12/30/2019 08:34   [place] RT BREAST BX W LOC DEV 1ST LESION IMG BX SPEC US GUIDE  Addendum Date: 01/03/2020   ADDENDUM REPORT: 01/02/2020 16:32 ADDENDUM: Pathology revealed GRADE II INVASIVE DUCTAL CARCINOMA of the Right breast, upper outer subareolar location 11 o'clock. This was found to be concordant by Dr. Peggye Fothergill. Pathology results were discussed with the patient by telephone. The patient reported doing well after the biopsy with tenderness at the site. Post biopsy instructions and care were reviewed and questions were answered. The patient was encouraged to call [facility] for any additional concerns. The patient was referred to [facility]  on January 11, 2020. Pathology results reported by Terie Purser, RN on 01/02/2020. Electronically Signed   By: Evangeline Dakin M.D.   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 She presented with a visible and palpable skin lesion involving the UPPER OUTER RIGHT areola and had an indeterminate focal asymmetry and 6 mm mass in the UPPER OUTER subareolar location of the RIGHT breast at the 11 o'clock position, underlying the skin lesion and at the ANTERIOR margin of the lumpectomy scar. Biopsy of the breast lesion is performed. EXAM: ULTRASOUND GUIDED RIGHT BREAST CORE NEEDLE BIOPSY COMPARISON:  Previous exam(s). FINDINGS: I met with the patient and we discussed the procedure of ultrasound-guided biopsy, including benefits and alternatives. We discussed the high likelihood of a successful procedure. We discussed the risks of the procedure, including infection, bleeding, tissue injury, clip migration, and inadequate sampling. Informed written consent was given. The usual time-out protocol was performed immediately prior to the procedure. Lesion quadrant: UPPER OUTER QUADRANT, 11 o'clock subareolar location. Using sterile technique with chlorhexidine as skin antisepsis, 1% lidocaine and 1% lidocaine with epinephrine as local anesthetic, under direct ultrasound visualization, a 12 gauge Bard Marquee core needle device placed through an 11 gauge introducer needle was used to perform biopsy of the hypoechoic mass underlying the skin lesion in the upper outer subareolar location using a lateral approach. At the conclusion of the procedure a ribbon shaped tissue marker clip was deployed into the biopsy cavity. Follow up 2 view mammogram was performed and dictated  separately. IMPRESSION: Ultrasound guided biopsy of an indeterminate focal asymmetry/mass in the UPPER OUTER subareolar RIGHT breast underlying a skin lesion, at the anterior margin of the lumpectomy scar. No apparent complications. Electronically Signed: By: Evangeline Dakin M.D. On: 12/30/2019 08:30      IMPRESSION/PLAN: Right Breast Cancer   Given her prior history of radiotherapy I do not recommend reirradiation of the breast.  The plan determined by Dr. Brantley Stage and Dr. Jana Hakim is that she will undergo breast conserving surgery followed by Herceptin and antiestrogen systemic therapy.  She understands the alternative to lumpectomy is to undergo mastectomy but she is enthusiastic about breast conserving therapy.  I wished her the very best and I will see her back on an as needed basis.  On date of  service, in total, I spent 30 minutes on this encounter. __________________________________________   [name], MD   This document serves as a record of services personally performed by [name], MD. It was created on her behalf by Wilburn Mylar, a trained medical scribe. The creation of this record is based on the scribe's personal observations and the provider's statements to them. This document has been checked and approved by the attending provider.

## 2020-01-10 NOTE — Progress Notes (Signed)
[facility]  [phone] [phone]     ID: Theresa Bradley DOB: [birth year]  MR#: [id number]  JYN#:[id number]  Patient Care Team: [name], MD as PCP - General [name], [name], MD as Consulting Physician (Allergy and Immunology) [name], OD as Referring Physician [name], RN as Oncology Nurse Navigator [name], RN as Oncology Nurse Navigator [name], MD as Consulting Physician (General Surgery) Dupree Givler, [name], MD as Consulting Physician (Oncology) [name], MD as Attending Physician (Radiation Oncology) [name], MD as Referring Physician (Dermatology) Chauncey Cruel, MD OTHER MD:  CHIEF COMPLAINT: triple positive breast cancer  CURRENT TREATMENT:    HISTORY OF CURRENT ILLNESS: Theresa Bradley was my patient a little over 11 years ago when she underwent lumpectomy on 05/15/2008 for ductal carcinoma in situ and received radiation therapy under Dr. Valere Dross. 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  More recently, she presented to her PCP with a small palpable right breast lump around 11 o'clock, just superior and lateral to the nipple. She underwent bilateral diagnostic mammography with tomography and right breast ultrasonography at [facility] on 12/26/2019 showing: breast density category B; 6 mm superficial mass in the right breast at 11 o'clock in the retroareolar region; no enlarged adenopathy in right axilla.  Accordingly on 12/30/2019 she proceeded to biopsy of the right breast area in question. The pathology from this procedure ([id number]) showed: invasive ductal carcinoma, grade 2. Prognostic indicators significant for: estrogen receptor, 95% positive and progesterone receptor, 80% positive, both with strong staining intensity. Proliferation marker Ki67 at 20%. HER2 positive  by immunohistochemistry (3+).  The patient's subsequent history is as detailed below.   INTERVAL HISTORY: Theresa "[name]" was evaluated in the multidisciplinary breast cancer clinic on 01/11/2020.  Her husband [place] participated by speaker phone.  Her case was also presented at the multidisciplinary breast cancer conference on the same day. At that time a preliminary plan was proposed: Either mastectomy  or lumpectomy plus or minus radiation followed by systemic therapy, with strong consideration of antiestrogen and anti-HER-2 immunotherapy without chemotherapy   REVIEW OF SYSTEMS: The patient denies unusual headaches, visual changes, nausea, vomiting, stiff neck, dizziness, or gait imbalance. There has been no cough, phlegm production, or pleurisy, no chest pain or pressure, and no change in bowel or bladder habits. The patient denies fever, rash, bleeding, unexplained fatigue or unexplained weight loss.  Currently she is not exercising regularly because of hip problems.  A detailed review of systems was otherwise entirely negative.   PAST MEDICAL HISTORY: Past Medical History:  Diagnosis Date   Anxiety    Breast cancer ([facility]) 2009   right   Cancer ([facility])    COUGH, CHRONIC 05/10/2010   Dysrhythmia    occationally "skips a beat"   GERD (gastroesophageal reflux disease)    HYPERTENSION 04/16/2009   HYPOTHYROIDISM 04/16/2009   OSTEOARTHRITIS 04/16/2009   Personal history of radiation therapy    SENILE LENTIGO 06/10/2010    PAST SURGICAL HISTORY: Past Surgical History:  Procedure Laterality Date   BREAST EXCISIONAL BIOPSY Right    BREAST EXCISIONAL BIOPSY Left    BREAST LUMPECTOMY Right 2009   radiation only   BREAST SURGERY  2009   X 2, cancer stage 0, lumpectomy   CESAREAN SECTION     1979, 1982  CHOLECYSTECTOMY     DILATATION & CURETTAGE/HYSTEROSCOPY WITH MYOSURE  11/2015   HERNIA REPAIR  2009   JOINT REPLACEMENT  2008   hip   OPEN SURGICAL REPAIR OF  GLUTEAL TENDON Left 07/18/2019   Procedure: Left hip bearing surface revision with gluteal tendon repair;  Surgeon: Gaynelle Arabian, MD;  Location: [facility];  Service: Orthopedics;  Laterality: Left;  2 hrs    FAMILY HISTORY: Family History  Problem Relation Age of Onset   Arthritis Other    Hypertension Other    Cancer Other        2 aunts   Hyperlipidemia Father    Dementia Father    Breast cancer Maternal Aunt    Breast cancer Maternal Aunt    Lung cancer Paternal Uncle    The patient's father is 6 years old and the patient's mother is 96 years old as of February 2021.  The patient has one brother, no sisters.  A maternal aunt was diagnosed with breast cancer at age 35.  A paternal uncle was diagnosed with lung cancer in his 80s and another paternal uncle with a "blood cancer" in his late 33s.  There is no history of ovarian pancreatic or prostate cancer in the family to her knowledge.  GYNECOLOGIC HISTORY:  No LMP recorded. Patient is postmenopausal. Menarche: 69 years old Age at first live birth: 69 years old [place] P 2 LMP HRT no  Hysterectomy?  No BSO?  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  Her husband [place] that for example checks for mold and then remediate.  Son Delfino Lovett, 7 years old, lives in [place] and works as a Clinical biochemist.  He has 2 sons.  The patient's son Catalina Antigua 39 lives in [place] and works in Chief Executive Officer.  He has no children.  The patient is Ep[employer]opalian    ADVANCED DIRECTIVES: In the absence of any documentation to the contrary, the patient's spouse is their HCPOA.    HEALTH MAINTENANCE: Social History   Tobacco Use   Smoking status: Never Smoker   Smokeless tobacco: Never Used   Tobacco comment: father smoked when she was younger   Substance Use Topics   Alcohol use: Yes    Comment: occasionally     Drug use: No     Colonoscopy: 03/2009/[facility]  PAP: 10/2013, negative  Bone density: 11/2015, T score -1.7   Allergies  Allergen Reactions   Codeine Sulfate Nausea And Vomiting   Erythromycin Base Other (See Comments)    Stomach ache   Esomeprazole Magnesium     REACTION: GI upset    Current Outpatient Medications  Medication Sig Dispense Refill   ALPRAZolam (XANAX) 0.25 MG tablet Take one half tablet po qhs prn insomnia 45 tablet 0   amLODipine (NORVASC) 5 MG tablet Take 1 tablet (5 mg total) by mouth daily. 90 tablet 3   Calcium Carbonate-Vitamin D 600-200 MG-UNIT TABS Take 1 tablet by mouth daily.     citalopram (CELEXA) 20 MG tablet Take 1 tablet (20 mg total) by mouth daily. 90 tablet 3   docusate sodium (COLACE) 100 MG capsule Take 100 mg by mouth daily as needed for mild constipation.      famotidine (PEPCID) 10 MG tablet Take 10 mg by mouth daily.      levothyroxine (SYNTHROID) 50 MCG tablet TAKE 1 TABLET BY MOUTH EVERY DAY  90 tablet 1   meloxicam (MOBIC) 15 MG tablet Take 1 tablet (15 mg total) by mouth daily. 90 tablet 1   EPIPEN 2-PAK 0.3 MG/0.3ML SOAJ injection Inject 0.3 mg into the muscle as needed for anaphylaxis.      HYDROcodone-acetaminophen (NORCO/VICODIN) 5-325 MG tablet Take 1-2 tablets by mouth every 6 (six) hours as needed for moderate pain. (Patient not taking: Reported on 12/16/2019) 56 tablet 0   mupirocin ointment (BACTROBAN) 2 % APPLY TO AFFECTED AREA EVERY DAY     tretinoin (RETIN-A) 0.025 % cream Apply topically at bedtime. (Patient not taking: Reported on 01/11/2020) 45 g 6   No current facility-administered medications for this visit.    OBJECTIVE:  white woman who appears stated age  5:   01/11/20 1248  BP: (!) 162/65  Pulse: (!) 58  Resp: 18  Temp: (!) 97.4 F (36.3 C)  SpO2: 99%     Body mass index is 27.33 kg/m.   Wt Readings from Last 3 Encounters:  01/11/20 158 lb (71.7 kg)  12/16/19 158 lb 6.4 oz (71.8 kg)    08/15/19 164 lb 1.6 oz (74.4 kg)      ECOG FS:1 - Symptomatic but completely ambulatory  Ocular: Sclerae unicteric, pupils round and equal Ear-nose-throat: Wearing a mask Lymphatic: No cervical or supraclavicular adenopathy Lungs no rales or rhonchi Heart regular rate and rhythm Abd soft, nontender, positive bowel sounds MSK no focal spinal tenderness, no joint edema Neuro: non-focal, well-oriented, appropriate affect Breasts: The right breast is status post recent biopsy.  There is no significant ecchymosis.  The left breast is benign.  Both axillae are benign.   LAB RESULTS:  CMP     Component Value Date/Time   NA 142 01/11/2020 1209   K 3.2 (L) 01/11/2020 1209   CL 103 01/11/2020 1209   CO2 29 01/11/2020 1209   GLUCOSE 116 (H) 01/11/2020 1209   BUN 10 01/11/2020 1209   CREATININE 0.81 01/11/2020 1209   CALCIUM 9.0 01/11/2020 1209   PROT 7.6 01/11/2020 1209   ALBUMIN 3.8 01/11/2020 1209   AST 14 (L) 01/11/2020 1209   ALT 13 01/11/2020 1209   ALKPHOS 76 01/11/2020 1209   BILITOT 0.5 01/11/2020 1209   GFRNONAA >60 01/11/2020 1209   GFRAA >60 01/11/2020 1209    No results found for: TOTALPROTELP, ALBUMINELP, A1GS, A2GS, BETS, BETA2SER, GAMS, MSPIKE, SPEI  Lab Results  Component Value Date   WBC 5.1 01/11/2020   NEUTROABS 2.9 01/11/2020   HGB 13.9 01/11/2020   HCT 42.4 01/11/2020   MCV 89.5 01/11/2020   PLT 308 01/11/2020    No results found for: LABCA2  No components found for: [id number]  No results for input(s): INR in the last 168 hours.  No results found for: LABCA2  No results found for: IPJ825  No results found for: KNL976  No results found for: BHA193  No results found for: CA2729  No components found for: HGQUANT  No results found for: CEA1 / No results found for: CEA1   No results found for: AFPTUMOR  No results found for: CHROMOGRNA  No results found for: KPAFRELGTCHN, LAMBDASER, KAPLAMBRATIO (kappa/lambda light chains)  No  results found for: HGBA, HGBA2QUANT, HGBFQUANT, HGBSQUAN (Hemoglobinopathy evaluation)   No results found for: LDH  No results found for: IRON, TIBC, IRONPCTSAT (Iron and TIBC)  No results found for: FERRITIN  Urinalysis    Component Value Date/Time   COLORURINE yellow 05/24/2010 0855   APPEARANCEUR Clear 05/24/2010 0855  LABSPEC 1.015 05/24/2010 0855   PHURINE 6.0 05/24/2010 0855   GLUCOSEU NEGATIVE 09/22/2007 1110   HGBUR negative 05/24/2010 0855   BILIRUBINUR n 06/29/2019 1533   KETONESUR NEGATIVE 09/22/2007 1110   PROTEINUR Negative 06/29/2019 1533   PROTEINUR NEGATIVE 09/22/2007 1110   UROBILINOGEN 0.2 06/29/2019 1533   UROBILINOGEN 0.2 05/24/2010 0855   NITRITE n 06/29/2019 1533   NITRITE negative 05/24/2010 0855   LEUKOCYTESUR Negative 06/29/2019 1533     STUDIES: US BREAST LTD UNI RIGHT INC AXILLA  Result Date: 12/26/2019 CLINICAL DATA:  History of right breast cancer status post lumpectomy in 2009. Patient complains of a developing mass in the 11 o'clock retroareolar region of the right breast. EXAM: DIGITAL DIAGNOSTIC BILATERAL MAMMOGRAM WITH CAD AND TOMO ULTRASOUND RIGHT BREAST COMPARISON:  Previous exam(s). ACR Breast Density Category b: There are scattered areas of fibroglandular density. FINDINGS: No suspicious mass or malignant type microcalcifications identified in the left breast. Lumpectomy changes are seen in the right breast. A radiopaque BB marks the palpable abnormality in the right breast. There is a developing superficial asymmetry in this area. There are no malignant type microcalcifications. Mammographic images were processed with CAD. On physical exam, I palpate a discrete superficial mass in the right breast at 11 o'clock in the retroareolar region. Targeted ultrasound is performed, showing an irregular superficial hypoechoic mass in the right breast 11 o'clock in the retroareolar region measuring 6 x 6 x 5 mm. Sonographic evaluation of the right axilla  does not show any enlarged adenopathy. IMPRESSION: Suspicious mass in the 11 o'clock region of the right breast. RECOMMENDATION: Ultrasound-guided core biopsy of the mass in the 11 o'clock region of the right breast is recommended. I have discussed the findings and recommendations with the patient. If applicable, a reminder letter will be sent to the patient regarding the next appointment. BI-RADS CATEGORY  4: Suspicious. Electronically Signed   By: Lillia Mountain M.D.   On: 12/26/2019 15:39   MM DIAG BREAST TOMO BILATERAL  Result Date: 12/26/2019 CLINICAL DATA:  History of right breast cancer status post lumpectomy in 2009. Patient complains of a developing mass in the 11 o'clock retroareolar region of the right breast. EXAM: DIGITAL DIAGNOSTIC BILATERAL MAMMOGRAM WITH CAD AND TOMO ULTRASOUND RIGHT BREAST COMPARISON:  Previous exam(s). ACR Breast Density Category b: There are scattered areas of fibroglandular density. FINDINGS: No suspicious mass or malignant type microcalcifications identified in the left breast. Lumpectomy changes are seen in the right breast. A radiopaque BB marks the palpable abnormality in the right breast. There is a developing superficial asymmetry in this area. There are no malignant type microcalcifications. Mammographic images were processed with CAD. On physical exam, I palpate a discrete superficial mass in the right breast at 11 o'clock in the retroareolar region. Targeted ultrasound is performed, showing an irregular superficial hypoechoic mass in the right breast 11 o'clock in the retroareolar region measuring 6 x 6 x 5 mm. Sonographic evaluation of the right axilla does not show any enlarged adenopathy. IMPRESSION: Suspicious mass in the 11 o'clock region of the right breast. RECOMMENDATION: Ultrasound-guided core biopsy of the mass in the 11 o'clock region of the right breast is recommended. I have discussed the findings and recommendations with the patient. If applicable, a  reminder letter will be sent to the patient regarding the next appointment. BI-RADS CATEGORY  4: Suspicious. Electronically Signed   By: Lillia Mountain M.D.   On: 12/26/2019 15:39   MM CLIP PLACEMENT RIGHT  Result Date:  12/30/2019 CLINICAL DATA:  Confirmation of clip placement after ultrasound-guided core needle biopsy of an indeterminate focal asymmetry/mass in the UPPER OUTER subareolar location, underlying a skin lesion involving the UPPER OUTER RIGHT areola, at the anterior margin of the lumpectomy scar. EXAM: 2D AND TOMOSYNTHESIS DIAGNOSTIC RIGHT MAMMOGRAM POST ULTRASOUND BIOPSY COMPARISON:  Previous exam(s). FINDINGS: Tomosynthesis and synthesized full field CC and mediolateral images were obtained following ultrasound guided biopsy of a mass in the UPPER OUTER subareolar RIGHT breast. The ribbon shaped tissue marking clip is appropriately position at the site of the biopsied focal asymmetry/mass. Expected post biopsy changes are present without evidence of hematoma. IMPRESSION: Appropriate positioning of the ribbon shaped biopsy marking clip at the site of the biopsied focal asymmetry/mass in the UPPER OUTER subareolar RIGHT breast. Final Assessment: Post Procedure Mammograms for Marker Placement Electronically Signed   By: Evangeline Dakin M.D.   On: 12/30/2019 08:34   [place] RT BREAST BX W LOC DEV 1ST LESION IMG BX SPEC US GUIDE  Addendum Date: 01/03/2020   ADDENDUM REPORT: 01/02/2020 16:32 ADDENDUM: Pathology revealed GRADE II INVASIVE DUCTAL CARCINOMA of the Right breast, upper outer subareolar location 11 o'clock. This was found to be concordant by Dr. Peggye Fothergill. Pathology results were discussed with the patient by telephone. The patient reported doing well after the biopsy with tenderness at the site. Post biopsy instructions and care were reviewed and questions were answered. The patient was encouraged to call [facility] for any additional concerns. The patient was  referred to [facility] on January 11, 2020. Pathology results reported by Terie Purser, RN on 01/02/2020. Electronically Signed   By: Evangeline Dakin M.D.   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 She presented with a visible and palpable skin lesion involving the UPPER OUTER RIGHT areola and had an indeterminate focal asymmetry and 6 mm mass in the UPPER OUTER subareolar location of the RIGHT breast at the 11 o'clock position, underlying the skin lesion and at the ANTERIOR margin of the lumpectomy scar. Biopsy of the breast lesion is performed. EXAM: ULTRASOUND GUIDED RIGHT BREAST CORE NEEDLE BIOPSY COMPARISON:  Previous exam(s). FINDINGS: I met with the patient and we discussed the procedure of ultrasound-guided biopsy, including benefits and alternatives. We discussed the high likelihood of a successful procedure. We discussed the risks of the procedure, including infection, bleeding, tissue injury, clip migration, and inadequate sampling. Informed written consent was given. The usual time-out protocol was performed immediately prior to the procedure. Lesion quadrant: UPPER OUTER QUADRANT, 11 o'clock subareolar location. Using sterile technique with chlorhexidine as skin antisepsis, 1% lidocaine and 1% lidocaine with epinephrine as local anesthetic, under direct ultrasound visualization, a 12 gauge Bard Marquee core needle device placed through an 11 gauge introducer needle was used to perform biopsy of the hypoechoic mass underlying the skin lesion in the upper outer subareolar location using a lateral approach. At the conclusion of the procedure a ribbon shaped tissue marker clip  was deployed into the biopsy cavity. Follow up 2 view mammogram was performed and dictated separately. IMPRESSION: Ultrasound guided biopsy of an indeterminate focal asymmetry/mass in the UPPER OUTER subareolar RIGHT breast underlying a skin lesion,  at the anterior margin of the lumpectomy scar. No apparent complications. Electronically Signed: By: Evangeline Dakin M.D. On: 12/30/2019 08:30     ELIGIBLE FOR AVAILABLE RESEARCH PROTOCOL: no  ASSESSMENT: 69 y.o. [place] woman status post right breast periareolar biopsy 12/30/2019 for a clinical T1a N0, stage I invasive ductal carcinoma, grade 2, estrogen and progesterone receptor positive, HER-2 amplified, with an MIB-one of 20%.  (1) history of prior right lumpectomy June 2009 for ductal carcinoma in situ  (a) status post adjuvant radiation  (b) did not receive antiestrogens  (2) pending breast conserving surgery with attempt at sentinel lymph node sampling  (3) consider adjuvant radiation if possible  (4) trastuzumab every 21 days for 6 months in conjunction with antiestrogens  (5) to start antiestrogens at the completion of local treatment.  PLAN: I met today with Dalma to review her new diagnosis. Specifically we discussed the biology of her breast cancer, its diagnosis, staging, treatment  options and prognosis.  Her case is complex chiefly for 2 reasons: First she already had radiation to the right breast and that affects the local treatment choices.  Second, her cancer is very small but HER-2 positive.  We discussed standard of care in this setting.  This would mean mastectomy with or without reconstruction, then if the tumor is larger than 5 mm consideration of trastuzumab and paclitaxel followed by antiestrogens.  Because of the location and size of the tumor our feeling at conference today was that lumpectomy should be possible.  The patient understands this is not standard of care but we feel it is adequate treatment especially  if some additional radiation may be possible.  Her prior records are being reviewed.  This means we need to optimize systemic treatment to prevent local recurrence and spread of this tumor.  We discussed the fact that for cancer is 5 mm and less systemic therapy is optional.  In this case however I would want to do anti-HER-2 treatment in addition to antiestrogen therapy given the fact that we do not have the usual benefit of radiation postlumpectomy.  We discussed the fact that adjuvant HER-2 treatment so far has only been studied in conjunction with chemotherapy.  However in stage IV disease we frequently combine antiestrogens and anti-HER-2 agents with excellent results and no contraindications.  Based on that data my suggestion is to proceed with 6 months of trastuzumab together with antiestrogens and avoid chemotherapy.  Again she understands this is not standard but it certainly is what I would recommend in this setting.  We then discussed the possible toxicities side effects and complications of treatment including particularly the concerns regarding weakening of the heart muscle with trastuzumab.  She will have an echocardiogram before we start and then every 3 months until completion of treatment.  Tentatively I am making her a return appointment with me in approximately 4 weeks.  We should be able to start her Herceptin after that.  Total encounter time 65 minutes.Chauncey Cruel, MD   01/11/2020 2:37 PM Medical Oncology and Hematology [facility] [place], [place] [address] Tel. [phone]    Fax. [phone]   This document serves as a record of services personally performed by Lurline Del, MD. It was created on his behalf by Wilburn Mylar, a trained medical scribe. The creation of this record is based on the scribe's personal observations and the provider's statements to them.   Lindie Spruce MD, have reviewed the above  documentation for accuracy and completeness, and I agree with the above.    *Total Encounter Time as defined by the Centers for Medicare and Medicaid Services includes, in addition to the face-to-face time of a patient visit (documented in the note above) non-face-to-face time: obtaining and reviewing outside history, ordering and reviewing medications, tests or procedures, care coordination (communications with other health care professionals or caregivers) and documentation in the medical record.

## 2020-01-11 ENCOUNTER — Ambulatory Visit (HOSPITAL_BASED_OUTPATIENT_CLINIC_OR_DEPARTMENT_OTHER): Payer: Medicare Other | Admitting: Genetic Counselor

## 2020-01-11 ENCOUNTER — Ambulatory Visit: Payer: Medicare Other | Attending: Surgery | Admitting: Physical Therapy

## 2020-01-11 ENCOUNTER — Encounter: Payer: Self-pay | Admitting: Licensed Clinical Social Worker

## 2020-01-11 ENCOUNTER — Encounter: Payer: Self-pay | Admitting: Oncology

## 2020-01-11 ENCOUNTER — Encounter: Payer: Self-pay | Admitting: Radiation Oncology

## 2020-01-11 ENCOUNTER — Ambulatory Visit: Payer: Self-pay | Admitting: Surgery

## 2020-01-11 ENCOUNTER — Inpatient Hospital Stay: Payer: Medicare Other

## 2020-01-11 ENCOUNTER — Encounter: Payer: Self-pay | Admitting: Physical Therapy

## 2020-01-11 ENCOUNTER — Other Ambulatory Visit: Payer: Self-pay

## 2020-01-11 ENCOUNTER — Inpatient Hospital Stay: Payer: Medicare Other | Attending: Oncology | Admitting: Oncology

## 2020-01-11 ENCOUNTER — Ambulatory Visit
Admission: RE | Admit: 2020-01-11 | Discharge: 2020-01-11 | Disposition: A | Discharge status: Home / Self Care | Payer: Medicare Other | Source: Ambulatory Visit | Attending: Radiation Oncology | Admitting: Radiation Oncology

## 2020-01-11 VITALS — BP 162/65 | HR 58 | Temp 97.4°F | Resp 18 | Ht 63.75 in | Wt 158.0 lb

## 2020-01-11 DIAGNOSIS — Z923 Personal history of irradiation: Secondary | ICD-10-CM | POA: Diagnosis not present

## 2020-01-11 DIAGNOSIS — C50411 Malignant neoplasm of upper-outer quadrant of right female breast: Secondary | ICD-10-CM

## 2020-01-11 DIAGNOSIS — Z801 Family history of malignant neoplasm of trachea, bronchus and lung: Secondary | ICD-10-CM | POA: Diagnosis not present

## 2020-01-11 DIAGNOSIS — C50911 Malignant neoplasm of unspecified site of right female breast: Secondary | ICD-10-CM

## 2020-01-11 DIAGNOSIS — Z86000 Personal history of in-situ neoplasm of breast: Secondary | ICD-10-CM | POA: Diagnosis not present

## 2020-01-11 DIAGNOSIS — Z170 Estrogen receptor positive status [ER+]: Secondary | ICD-10-CM

## 2020-01-11 DIAGNOSIS — E039 Hypothyroidism, unspecified: Secondary | ICD-10-CM | POA: Insufficient documentation

## 2020-01-11 DIAGNOSIS — Z803 Family history of malignant neoplasm of breast: Secondary | ICD-10-CM

## 2020-01-11 DIAGNOSIS — Z853 Personal history of malignant neoplasm of breast: Secondary | ICD-10-CM

## 2020-01-11 DIAGNOSIS — I10 Essential (primary) hypertension: Secondary | ICD-10-CM | POA: Diagnosis not present

## 2020-01-11 DIAGNOSIS — Z807 Family history of other malignant neoplasms of lymphoid, hematopoietic and related tissues: Secondary | ICD-10-CM

## 2020-01-11 DIAGNOSIS — C439 Malignant melanoma of skin, unspecified: Secondary | ICD-10-CM

## 2020-01-11 DIAGNOSIS — R293 Abnormal posture: Secondary | ICD-10-CM | POA: Diagnosis not present

## 2020-01-11 DIAGNOSIS — Z808 Family history of malignant neoplasm of other organs or systems: Secondary | ICD-10-CM

## 2020-01-11 LAB — CBC WITH DIFFERENTIAL (CANCER CENTER ONLY)
Abs Immature Granulocytes: 0.01 10*3/uL (ref 0.00–0.07)
Basophils Absolute: 0 10*3/uL (ref 0.0–0.1)
Basophils Relative: 1 %
Eosinophils Absolute: 0.2 10*3/uL (ref 0.0–0.5)
Eosinophils Relative: 4 %
HCT: 42.4 % (ref 36.0–46.0)
Hemoglobin: 13.9 g/dL (ref 12.0–15.0)
Immature Granulocytes: 0 %
Lymphocytes Relative: 28 %
Lymphs Abs: 1.4 10*3/uL (ref 0.7–4.0)
MCH: 29.3 pg (ref 26.0–34.0)
MCHC: 32.8 g/dL (ref 30.0–36.0)
MCV: 89.5 fL (ref 80.0–100.0)
Monocytes Absolute: 0.4 10*3/uL (ref 0.1–1.0)
Monocytes Relative: 9 %
Neutro Abs: 2.9 10*3/uL (ref 1.7–7.7)
Neutrophils Relative %: 58 %
Platelet Count: 308 10*3/uL (ref 150–400)
RBC: 4.74 MIL/uL (ref 3.87–5.11)
RDW: 13.3 % (ref 11.5–15.5)
WBC Count: 5.1 10*3/uL (ref 4.0–10.5)
nRBC: 0 % (ref 0.0–0.2)

## 2020-01-11 LAB — CMP (CANCER CENTER ONLY)
ALT: 13 U/L (ref 0–44)
AST: 14 U/L — ABNORMAL LOW (ref 15–41)
Albumin: 3.8 g/dL (ref 3.5–5.0)
Alkaline Phosphatase: 76 U/L (ref 38–126)
Anion gap: 10 (ref 5–15)
BUN: 10 mg/dL (ref 8–23)
CO2: 29 mmol/L (ref 22–32)
Calcium: 9 mg/dL (ref 8.9–10.3)
Chloride: 103 mmol/L (ref 98–111)
Creatinine: 0.81 mg/dL (ref 0.44–1.00)
GFR, Est AFR Am: >60 mL/min (ref >60)
GFR, Estimated: >60 mL/min (ref >60)
Glucose, Bld: 116 mg/dL — ABNORMAL HIGH (ref 70–99)
Potassium: 3.2 mmol/L — ABNORMAL LOW (ref 3.5–5.1)
Sodium: 142 mmol/L (ref 135–145)
Total Bilirubin: 0.5 mg/dL (ref 0.3–1.2)
Total Protein: 7.6 g/dL (ref 6.5–8.1)

## 2020-01-11 LAB — GENETIC SCREENING ORDER

## 2020-01-11 NOTE — Therapy (Signed)
[place], [place], [address] Phone: [phone]   Fax:  [phone]  Physical Therapy Evaluation  Patient Details  Name: Theresa Bradley MRN: [id number] Date of Birth: 1951/06/03 Referring Provider (PT): Dr. [name]   Encounter Date: 01/11/2020  PT End of Session - 01/11/20 1416    Visit Number  1    Number of Visits  2    Date for PT Re-Evaluation  03/07/20    PT Start Time  2563    PT Stop Time  8937   Also saw pt from 1425 to 1441 for a total of 28 minutes   PT Time Calculation (min)  12 min    Activity Tolerance  Patient tolerated treatment well    Behavior During Therapy  [facility] for tasks assessed/performed       Past Medical History:  Diagnosis Date  . Anxiety   . Breast cancer ([place]) 2009   right  . Cancer ([place])   . COUGH, CHRONIC 05/10/2010  . Dysrhythmia    occationally "skips a beat"  . GERD (gastroesophageal reflux disease)   . HYPERTENSION 04/16/2009  . HYPOTHYROIDISM 04/16/2009  . OSTEOARTHRITIS 04/16/2009  . Personal history of radiation therapy   . SENILE LENTIGO 06/10/2010    Past Surgical History:  Procedure Laterality Date  . BREAST EXCISIONAL BIOPSY Right   . BREAST EXCISIONAL BIOPSY Left   . BREAST LUMPECTOMY Right 2009   radiation only  . BREAST SURGERY  2009   X 2, cancer stage 0, lumpectomy  . [place]  . CHOLECYSTECTOMY    . DILATATION & CURETTAGE/HYSTEROSCOPY WITH MYOSURE  11/2015  . HERNIA REPAIR  2009  . JOINT REPLACEMENT  2008   hip  . OPEN SURGICAL REPAIR OF GLUTEAL TENDON Left 07/18/2019   Procedure: Left hip bearing surface revision with gluteal tendon repair;  Surgeon: Gaynelle Arabian, MD;  Location: [facility];  Service: Orthopedics;  Laterality: Left;  2 hrs    There were no vitals filed for this visit.   Subjective Assessment - 01/11/20 1402    Subjective  Patient reports she is here today to be seen by her medical team for  her newly diagnosed right breast cancer.    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 It measures 6 mm and is located in the upper outer quadrant. Ki67 is 20%. She has a history of a left hip replacement in 2008 and a revision in 06/2019. 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 She underwent a right lumpectomy and radiation at that time.    Patient Stated Goals  Reduce lymphedema risk and learn post op shoulder ROM HEP    Currently in Pain?  Yes    Pain Score  2     Pain Location  Hip    Pain Orientation  Left    Pain Descriptors / Indicators  Aching    Pain Type  Chronic pain    Pain Onset  More than a month ago    Pain Frequency  Intermittent    Aggravating Factors   walking    Pain Relieving Factors  rest    Multiple Pain Sites  Yes    Pain Score  2    Pain Location  Shoulder    Pain Orientation  Left    Pain Descriptors / Indicators  Aching  Pain Type  Chronic pain    Pain Onset  More than a month ago    Pain Frequency  Intermittent    Aggravating Factors   Using arm    Pain Relieving Factors  Rest         [facility] PT Assessment - 01/11/20 0001      Assessment   Medical Diagnosis  Right breast cancer    Referring Provider (PT)  Dr. Marcello Moores Cornett    Onset Date/Surgical Date  12/26/19    Hand Dominance  Right    Prior Therapy  none      Precautions   Precautions  Other (comment)    Precaution Comments  active cancer      Restrictions   Weight Bearing Restrictions  No      Balance Screen   Has the patient fallen in the past 6 months  No    Has the patient had a decrease in activity level because of a fear of falling?   No    Is the patient reluctant to leave their home because of a fear of falling?   No      Home Environment   Living Environment  Private residence    Living Arrangements  Spouse/significant other    Available Help at Discharge  Family      Prior Function    Level of Independence  Independent    Vocation  Full time employment    Education officer, environmental    Leisure  She does not exercise      Cognition   Overall Cognitive Status  Within Functional Limits for tasks assessed      Posture/Postural Control   Posture/Postural Control  Postural limitations    Postural Limitations  Rounded Shoulders;Forward head      ROM / Strength   AROM / PROM / Strength  AROM;Strength      AROM   AROM Assessment Site  Shoulder    Right/Left Shoulder  Right;Left    Right Shoulder Extension  40 Degrees    Right Shoulder Flexion  135 Degrees    Right Shoulder ABduction  152 Degrees    Right Shoulder Internal Rotation  70 Degrees    Right Shoulder External Rotation  71 Degrees    Left Shoulder Extension  45 Degrees    Left Shoulder Flexion  135 Degrees    Left Shoulder ABduction  151 Degrees    Left Shoulder Internal Rotation  56 Degrees    Left Shoulder External Rotation  70 Degrees      Strength   Overall Strength  Within functional limits for tasks performed        LYMPHEDEMA/ONCOLOGY QUESTIONNAIRE - 01/11/20 1414      Type   Cancer Type  Right breast cancer      Lymphedema Assessments   Lymphedema Assessments  Upper extremities      Right Upper Extremity Lymphedema   10 cm Proximal to Olecranon Process  27.3 cm    Olecranon Process  24.4 cm    10 cm Proximal to Ulnar Styloid Process  21.4 cm    Just Proximal to Ulnar Styloid Process  15.9 cm    Across Hand at [employer]  19.3 cm    At [place] of 2nd Digit  6.3 cm      Left Upper Extremity Lymphedema   10 cm Proximal to Olecranon Process  27 cm    Olecranon Process  24.7 cm  10 cm Proximal to Ulnar Styloid Process  21 cm    Just Proximal to Ulnar Styloid Process  15.8 cm    Across Hand at [employer]  19.4 cm    At [place] of 2nd Digit  6.2 cm          Quick Dash - 01/11/20 0001    Open a tight or new jar  Mild difficulty    Do heavy household chores (wash walls,  wash floors)  Moderate difficulty    Carry a shopping bag or briefcase  No difficulty    Wash your back  No difficulty    Use a knife to cut food  No difficulty    Recreational activities in which you take some force or impact through your arm, shoulder, or hand (golf, hammering, tennis)  Mild difficulty    During the past week, to what extent has your arm, shoulder or hand problem interfered with your normal social activities with family, friends, neighbors, or groups?  Not at all    During the past week, to what extent has your arm, shoulder or hand problem limited your work or other regular daily activities  Not at all    Arm, shoulder, or hand pain.  Mild    Tingling (pins and needles) in your arm, shoulder, or hand  None    Difficulty Sleeping  No difficulty    DASH Score  11.36 %        Objective measurements completed on examination: See above findings.        Patient was instructed today in a home exercise program today for post op shoulder range of motion. These included active assist shoulder flexion in sitting, scapular retraction, wall walking with shoulder abduction, and hands behind head external rotation.  She was encouraged to do these twice a day, holding 3 seconds and repeating 5 times when permitted by her physician.          PT Education - 01/11/20 1414    Education Details  Lymphedema risk reduction and post op shoulder ROM HEP    Person(s) Educated  Patient    Methods  Explanation;Demonstration;Handout    Comprehension  Returned demonstration;Verbalized understanding          PT Long Term Goals - 01/11/20 1422      PT LONG TERM GOAL #1   Title  Patient will demonstrate she has regained full shoulder ROM and function post operatively compared to baselines.    Time  8    Period  Weeks    Status  New    Target Date  03/07/20      Breast Clinic Goals - 01/11/20 1422      Patient will be able to verbalize understanding of pertinent lymphedema  risk reduction practices relevant to her diagnosis specifically related to skin care.   Time  1    Period  Days    Status  Achieved      Patient will be able to return demonstrate and/or verbalize understanding of the post-op home exercise program related to regaining shoulder range of motion.   Time  1    Period  Days    Status  Achieved      Patient will be able to verbalize understanding of the importance of attending the postoperative After Breast Cancer Class for further lymphedema risk reduction education and therapeutic exercise.   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 It measures 6 mm and is located in the upper outer quadrant. Ki67 is 20%. She has a history of a left hip replacement in 2008 and a revision in 06/2019. 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 She underwent a right lumpectomy and radiation at that time. Her multidisciplinary medical team met prior to her assessments to determine a recommended treatment plan. She is planning to have a right lumpectomy and sentinel node biopsy followed by Herceptin and anti-estrogen therapy. She will benefit from a post op PT reassessment to determine needs.    Stability/Clinical Decision Making  Stable/Uncomplicated    Clinical Decision Making  Low    Rehab Potential  Excellent    PT Frequency  --   Eval and 1 f/u visit   PT Treatment/Interventions  ADLs/Self Care Home Management;Therapeutic exercise;Patient/family education    PT Next Visit Plan  Will reassess 3-4 weeks post op to determine needs    PT Home Exercise Plan  Post op shoulder ROM HEP    Consulted and Agree with Plan of Care  Patient       Patient will benefit from skilled therapeutic intervention in order to improve the following deficits and impairments:  Postural  dysfunction, Decreased range of motion, Impaired UE functional use, Pain, Decreased knowledge of precautions  Visit Diagnosis: Malignant neoplasm of upper-outer quadrant of right breast in female, estrogen receptor positive ([place]) - Plan: PT plan of care cert/re-cert  Abnormal posture - Plan: PT plan of care cert/re-cert   Patient will follow up at outpatient cancer rehab 3-4 weeks following surgery.  If the patient requires physical therapy at that time, a specific plan will be dictated and sent to the referring physician for approval. The patient was educated today on appropriate basic range of motion exercises to begin post operatively and the importance of attending the After Breast Cancer class following surgery.  Patient was educated today on lymphedema risk reduction practices as it pertains to recommendations that will benefit the patient immediately following surgery.  She verbalized good understanding.      Problem List Patient Active Problem List   Diagnosis Date Noted  . Malignant neoplasm of upper-outer quadrant of right breast in female, estrogen receptor positive ([place]) 01/04/2020  . History of right breast cancer 12/16/2019  . Melanoma of skin ([place]) 08/15/2019  . 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 Failed total hip arthroplasty ([place]) 07/18/2019  . Cough 05/31/2014  . GERD (gastroesophageal reflux disease) 09/13/2012  . HYPOTHYROIDISM 04/16/2009  . Essential hypertension 04/16/2009  . OSTEOARTHRITIS 04/16/2009   Annia Friendly, PT 01/11/20 2:47 PM  [place], [place], [address] Phone: [phone]   Fax:  [phone]  Name: AMELIANNA MELLER MRN: [id number] Date of Birth: 06/07/1951

## 2020-01-11 NOTE — Patient Instructions (Signed)

## 2020-01-11 NOTE — H&P (Signed)
Theresa Bradley Documented: 01/11/2020 7:29 AM Location: [facility] Patient #: [id number] DOB: 1950-12-04 Undefined / Language: Theresa Bradley / Race: White Female  History of Present Illness Theresa Moores A. Cornett MD; 01/11/2020 2:34 PM) Patient words: Pt seen in [place] due to 1 month history of right breast mass. 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 SHE HAD A 1 CM MASS SUBAREOLAR COR BX idc er per her 2 neu pos.        History of right breast cancer status post lumpectomy in 2009. Patient complains of a developing mass in the 11 o'clock retroareolar region of the right breast.  EXAM: DIGITAL DIAGNOSTIC BILATERAL MAMMOGRAM WITH CAD AND TOMO  ULTRASOUND RIGHT BREAST  COMPARISON: Previous exam(s).  ACR Breast Density Category b: There are scattered areas of fibroglandular density.  FINDINGS: No suspicious mass or malignant type microcalcifications identified in the left breast.  Lumpectomy changes are seen in the right breast. A radiopaque BB marks the palpable abnormality in the right breast. There is a developing superficial asymmetry in this area. There are no malignant type microcalcifications.  Mammographic images were processed with CAD.  On physical exam, I palpate a discrete superficial mass in the right breast at 11 o'clock in the retroareolar region.  Targeted ultrasound is performed, showing an irregular superficial hypoechoic mass in the right breast 11 o'clock in the retroareolar region measuring 6 x 6 x 5 mm. Sonographic evaluation of the right axilla does not show any enlarged adenopathy.  IMPRESSION: Suspicious mass in the 11 o'clock region of the right breast.  RECOMMENDATION: Ultrasound-guided core biopsy of the mass in the 11 o'clock region of the right breast is recommended.  I have discussed the findings and recommendations with the patient. If applicable, a reminder letter will be sent to the patient regarding the next  appointment.  BI-RADS CATEGORY 4: Suspicious.   Electronically Signed By: Lillia Mountain M.D. On: 12/26/2019 15:39      ADDITIONAL INFORMATION: PROGNOSTIC INDICATORS Results: IMMUNOHISTOCHEMICAL AND MORPHOMETRIC ANALYSIS PERFORMED MANUALLY The tumor cells are POSITIVE for Her2 (3+). Estrogen Receptor: 95%, POSITIVE, STRONG STAINING INTENSITY Progesterone Receptor: 80%, POSITIVE, STRONG STAINING INTENSITY Proliferation Marker Ki67: 20% REFERENCE RANGE ESTROGEN RECEPTOR NEGATIVE 0% POSITIVE =>1% REFERENCE RANGE PROGESTERONE RECEPTOR NEGATIVE 0% POSITIVE =>1% All controls stained appropriately Vicente Males MD Pathologist, Electronic Signature ( Signed 01/03/2020) FINAL DIAGNOSIS Diagnosis Breast, right, needle core biopsy, upper outer subareolar location 11 o'clock - INVASIVE DUCTAL CARCINOMA. 1 of 3 FINAL for Stage, [name] ([id number]) Microscopic Comment The carcinoma appears grade 2. The greatest linear extent of tumor in any one core is 6 mm. Ancillary studies will be reported separately. Results reported to [facility] on 01/02/2020. Intradepartmental consultation (Dr. Tresa Moore).  The patient is a 69 year old female.   Past Surgical History Tawni Pummel, RN; 01/11/2020 7:29 AM) Breast Biopsy Right. Breast Mass; Local Excision Right. Gallbladder Surgery - Open Ventral / Umbilical Hernia Surgery Right.  Diagnostic Studies History Tawni Pummel, RN; 01/11/2020 7:29 AM) Mammogram within last year Pap Smear 1-5 years ago  Medication History Tawni Pummel, RN; 01/11/2020 7:29 AM) Medications Reconciled  Social History Tawni Pummel, RN; 01/11/2020 7:29 AM) Alcohol use Occasional alcohol use. Caffeine use Carbonated beverages, Coffee. No drug use Tobacco use Never smoker.  Family History Tawni Pummel, RN; 01/11/2020 7:29 AM) Breast Cancer Family Members In General. Heart Disease Father. Hypertension  Father. Respiratory Condition Family Members In General.  Pregnancy / Birth History Tawni Pummel, RN;  01/11/2020 7:29 AM) Age at menarche 60 years. Age of menopause 51-55 Contraceptive History Oral contraceptives. Gravida 2 Length (months) of breastfeeding 7-12 Maternal age 68-30 Para 2  Other Problems Tawni Pummel, RN; 01/11/2020 7:29 AM) Anxiety Disorder Arthritis Breast Cancer Gastroesophageal Reflux Disease Melanoma Thyroid Disease     Review of Systems Sunday Spillers Ledford RN; 01/11/2020 7:29 AM) General Not Present- Appetite Loss, Chills, Fatigue, Fever, Night Sweats, Weight Gain and Weight Loss. Skin Not Present- Change in Wart/Mole, Dryness, Hives, Jaundice, New Lesions, Non-Healing Wounds, Rash and Ulcer. HEENT Not Present- Earache, Hearing Loss, Hoarseness, Nose Bleed, Oral Ulcers, Ringing in the Ears, Seasonal Allergies, Sinus Pain, Sore Throat, Visual Disturbances, Wears glasses/contact lenses and Yellow Eyes. Respiratory Present- Chronic Cough. Not Present- Bloody sputum, Difficulty Breathing, Snoring and Wheezing. Breast Present- Breast Mass. Not Present- Breast Pain, Nipple Discharge and Skin Changes. Cardiovascular Not Present- Chest Pain, Difficulty Breathing Lying Down, Leg Cramps, Palpitations, Rapid Heart Rate, Shortness of Breath and Swelling of Extremities. Gastrointestinal Present- Hemorrhoids. Not Present- Abdominal Pain, Bloating, Bloody Stool, Change in Bowel Habits, Chronic diarrhea, Constipation, Difficulty Swallowing, Excessive gas, Gets full quickly at meals, Indigestion, Nausea, Rectal Pain and Vomiting. Female Genitourinary Not Present- Frequency, Nocturia, Painful Urination, Pelvic Pain and Urgency. Musculoskeletal Present- Muscle Weakness. Not Present- Back Pain, Joint Pain, Joint Stiffness, Muscle Pain and Swelling of Extremities. Neurological Not Present- Decreased Memory, Fainting, Headaches, Numbness, Seizures, Tingling, Tremor,  Trouble walking and Weakness. Endocrine Not Present- Cold Intolerance, Excessive Hunger, Hair Changes, Heat Intolerance, Hot flashes and New Diabetes.   Physical Exam ([name] A. Leigha Olberding MD; 01/11/2020 2:35 PM)  General Mental Status-Alert. General Appearance-Consistent with stated age. Hydration-Well hydrated. Voice-Normal.  Head and Neck Head-normocephalic, atraumatic with no lesions or palpable masses. Trachea-midline. Thyroid Gland Characteristics - normal size and consistency.  Breast Note: 1 cm subareolar mass 1 cm mobile scar noted left breast norma right breast scar from melanoma excision  Neurologic Neurologic evaluation reveals -alert and oriented x 3 with no impairment of recent or remote memory. Mental Status-Normal.  Lymphatic Head & Neck  General Head & Neck Lymphatics: Bilateral - Description - Normal. Axillary  General Axillary Region: Bilateral - Description - Normal. Tenderness - Non Tender.    Assessment & Plan (Jhonny Calixto A. Lameeka Schleifer MD; 01/11/2020 2:39 PM)  BREAST CANCER, RIGHT (C50.911) Impression: pt is a candidate for repeat lumpectomy given tumor grade, marker and age reviewed the liturature for this as well the other option is mastectomy Recommend SLN mapping if able for both She will think this over and let me know Risk of lumpectomy include bleeding, infection, seroma, more surgery, use of seed/wire, wound care, cosmetic deformity and the need for other treatments, death , blood clots, death. Pt agrees to proceed. Risk of sentinel lymph node mapping include bleeding, infection, lymphedema, shoulder pain. stiffness, dye allergy. cosmetic deformity , blood clots, death, need for more surgery. Pt agrees to proceed. Discussed treatment options for breast cancer to include breast conservation vs mastectomy with reconstruction. Pt has decided on mastectomy. Risk include bleeding, infection, flap necrosis, pain, numbness, recurrence,  hematoma, other surgery needs. Pt understands and agrees to proceed.  total time 45 minutes  Current Plans Pt Education - [facility] Free Text Education/Instructions: discussed with patient and provided information. Pt Education - [facility] General Post-op HCI You are being scheduled for surgery- Our schedulers will call you.  You should hear from our office's scheduling department within 5 working days about the location, date, and time of surgery. We try to  make accommodations for patient's preferences in scheduling surgery, but sometimes the OR schedule or the surgeon's schedule prevents [place] from making those accommodations.  If you have not heard from our office [phone]) in 5 working days, call the office and ask for your surgeon's nurse.  If you have other questions about your diagnosis, plan, or surgery, call the office and ask for your surgeon's nurse.  Pt Education - [facility] Breast Cancer Information Given - [facility] "Breast Journey" Package Pt Education - flb breast cancer surgery: discussed with patient and provided information. Pt Education - [facility] Breast Biopsy HCI: discussed with patient and provided information. Pt Education - [facility] Mastectomy HCI

## 2020-01-11 NOTE — H&P (View-Only) (Signed)
eanne Bradley Geimer Documented: 01/11/2020 7:29 AM Location: [facility] Patient #: [id number] DOB: 04/21/1951 Undefined / Language: English / Race: White Female  History of Present Illness (Theresa Holsonback A. Mavis Fichera Bradley; 01/11/2020 2:34 PM) Patient words: Pt seen in [facility] due to 1 month history of right breast mass. 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 SHE HAD A 1 CM MASS SUBAREOLAR COR BX idc er per her 2 neu pos.        History of right breast cancer status post lumpectomy in 2009. Patient complains of a developing mass in the 11 o'clock retroareolar region of the right breast.  EXAM: DIGITAL DIAGNOSTIC BILATERAL MAMMOGRAM WITH CAD AND TOMO  ULTRASOUND RIGHT BREAST  COMPARISON: Previous exam(s).  ACR Breast Density Category b: There are scattered areas of fibroglandular density.  FINDINGS: No suspicious mass or malignant type microcalcifications identified in the left breast.  Lumpectomy changes are seen in the right breast. A radiopaque BB marks the palpable abnormality in the right breast. There is a developing superficial asymmetry in this area. There are no malignant type microcalcifications.  Mammographic images were processed with CAD.  On physical exam, I palpate a discrete superficial mass in the right breast at 11 o'clock in the retroareolar region.  Targeted ultrasound is performed, showing an irregular superficial hypoechoic mass in the right breast 11 o'clock in the retroareolar region measuring 6 x 6 x 5 mm. Sonographic evaluation of the right axilla does not show any enlarged adenopathy.  IMPRESSION: Suspicious mass in the 11 o'clock region of the right breast.  RECOMMENDATION: Ultrasound-guided core biopsy of the mass in the 11 o'clock region of the right breast is recommended.  I have discussed the findings and recommendations with the patient. If applicable, a reminder letter will be sent to the patient regarding the next  appointment.  BI-RADS CATEGORY 4: Suspicious.   Electronically Signed By: Theresa Bradley M.D. On: 12/26/2019 15:39      ADDITIONAL INFORMATION: PROGNOSTIC INDICATORS Results: IMMUNOHISTOCHEMICAL AND MORPHOMETRIC ANALYSIS PERFORMED MANUALLY The tumor cells are POSITIVE for Her2 (3+). Estrogen Receptor: 95%, POSITIVE, STRONG STAINING INTENSITY Progesterone Receptor: 80%, POSITIVE, STRONG STAINING INTENSITY Proliferation Marker Ki67: 20% REFERENCE RANGE ESTROGEN RECEPTOR NEGATIVE 0% POSITIVE =>1% REFERENCE RANGE PROGESTERONE RECEPTOR NEGATIVE 0% POSITIVE =>1% All controls stained appropriately Theresa Bradley Pathologist, Electronic Signature ( Signed 01/03/2020) FINAL DIAGNOSIS Diagnosis Breast, right, needle core biopsy, upper outer subareolar location 11 o'clock - INVASIVE DUCTAL CARCINOMA. 1 of 3 FINAL for Theresa Bradley, Theresa Bradley ([id number]) Microscopic Comment The carcinoma appears grade 2. The greatest linear extent of tumor in any one core is 6 mm. Ancillary studies will be reported separately. Results reported to [facility] on 01/02/2020. Intradepartmental consultation (Dr. Manny).  The patient is a 68 year old female.   Past Surgical History (Theresa Ledford, RN; 01/11/2020 7:29 AM) Breast Biopsy Right. Breast Mass; Local Excision Right. Gallbladder Surgery - Open Ventral / Umbilical Hernia Surgery Right.  Diagnostic Studies History (Theresa Ledford, RN; 01/11/2020 7:29 AM) Mammogram within last year Pap Smear 1-5 years ago  Medication History (Theresa Ledford, RN; 01/11/2020 7:29 AM) Medications Reconciled  Social History (Theresa Ledford, RN; 01/11/2020 7:29 AM) Alcohol use Occasional alcohol use. Caffeine use Carbonated beverages, Coffee. No drug use Tobacco use Never smoker.  Family History (Theresa Ledford, RN; 01/11/2020 7:29 AM) Breast Cancer Family Members In General. Heart Disease Father. Hypertension  Father. Respiratory Condition Family Members In General.  Pregnancy / Birth History (Theresa Ledford, RN;   01/11/2020 7:29 AM) Age at menarche 40 years. Age of menopause 51-55 Contraceptive History Oral contraceptives. Gravida 2 Length (months) of breastfeeding 7-12 Maternal age 43-30 Para 2  Other Problems Theresa Pummel, RN; 01/11/2020 7:29 AM) Anxiety Disorder Arthritis Breast Cancer Gastroesophageal Reflux Disease Melanoma Thyroid Disease     Review of Systems Theresa Spillers Ledford RN; 01/11/2020 7:29 AM) General Not Present- Appetite Loss, Chills, Fatigue, Fever, Night Sweats, Weight Gain and Weight Loss. Skin Not Present- Change in Wart/Mole, Dryness, Hives, Jaundice, New Lesions, Non-Healing Wounds, Rash and Ulcer. HEENT Not Present- Earache, Hearing Loss, Hoarseness, Nose Bleed, Oral Ulcers, Ringing in the Ears, Seasonal Allergies, Sinus Pain, Sore Throat, Visual Disturbances, Wears glasses/contact lenses and Yellow Eyes. Respiratory Present- Chronic Cough. Not Present- Bloody sputum, Difficulty Breathing, Snoring and Wheezing. Breast Present- Breast Mass. Not Present- Breast Pain, Nipple Discharge and Skin Changes. Cardiovascular Not Present- Chest Pain, Difficulty Breathing Lying Down, Leg Cramps, Palpitations, Rapid Heart Rate, Shortness of Breath and Swelling of Extremities. Gastrointestinal Present- Hemorrhoids. Not Present- Abdominal Pain, Bloating, Bloody Stool, Change in Bowel Habits, Chronic diarrhea, Constipation, Difficulty Swallowing, Excessive gas, Gets full quickly at meals, Indigestion, Nausea, Rectal Pain and Vomiting. Female Genitourinary Not Present- Frequency, Nocturia, Painful Urination, Pelvic Pain and Urgency. Musculoskeletal Present- Muscle Weakness. Not Present- Back Pain, Joint Pain, Joint Stiffness, Muscle Pain and Swelling of Extremities. Neurological Not Present- Decreased Memory, Fainting, Headaches, Numbness, Seizures, Tingling, Tremor,  Trouble walking and Weakness. Endocrine Not Present- Cold Intolerance, Excessive Hunger, Hair Changes, Heat Intolerance, Hot flashes and New Diabetes.   Physical Exam (Theresa Rudin A. Jarika Robben Bradley; 01/11/2020 2:35 PM)  General Mental Status-Alert. General Appearance-Consistent with stated age. Hydration-Well hydrated. Voice-Normal.  Head and Neck Head-normocephalic, atraumatic with no lesions or palpable masses. Trachea-midline. Thyroid Gland Characteristics - normal size and consistency.  Breast Note: 1 cm subareolar mass 1 cm mobile scar noted left breast norma right breast scar from melanoma excision  Neurologic Neurologic evaluation reveals -alert and oriented x 3 with no impairment of recent or remote memory. Mental Status-Normal.  Lymphatic Head & Neck  General Head & Neck Lymphatics: Bilateral - Description - Normal. Axillary  General Axillary Region: Bilateral - Description - Normal. Tenderness - Non Tender.    Assessment & Plan (Mosi Hannold A. Errik Mitchelle Bradley; 01/11/2020 2:39 PM)  BREAST CANCER, RIGHT (C50.911) Impression: pt is a candidate for repeat lumpectomy given tumor grade, marker and age reviewed the liturature for this as well the other option is mastectomy Recommend SLN mapping if able for both She will think this over and let me know Risk of lumpectomy include bleeding, infection, seroma, more surgery, use of seed/wire, wound care, cosmetic deformity and the need for other treatments, death , blood clots, death. Pt agrees to proceed. Risk of sentinel lymph node mapping include bleeding, infection, lymphedema, shoulder pain. stiffness, dye allergy. cosmetic deformity , blood clots, death, need for more surgery. Pt agrees to proceed. Discussed treatment options for breast cancer to include breast conservation vs mastectomy with reconstruction. Pt has decided on mastectomy. Risk include bleeding, infection, flap necrosis, pain, numbness, recurrence,  hematoma, other surgery needs. Pt understands and agrees to proceed.  total time 45 minutes  Current Plans Pt Education - [facility] Free Text Education/Instructions: discussed with patient and provided information. Pt Education - [facility] General Post-op HCI You are being scheduled for surgery- Our schedulers will call you.  You should hear from our office's scheduling department within 5 working days about the location, date, and time of surgery. We try to  make accommodations for patient's preferences in scheduling surgery, but sometimes the OR schedule or the surgeon's schedule prevents [place] from making those accommodations.  If you have not heard from our office [phone]) in 5 working days, call the office and ask for your surgeon's nurse.  If you have other questions about your diagnosis, plan, or surgery, call the office and ask for your surgeon's nurse.  Pt Education - [facility] Breast Cancer Information Given - [facility] "Breast Journey" Package Pt Education - flb breast cancer surgery: discussed with patient and provided information. Pt Education - [facility] Breast Biopsy HCI: discussed with patient and provided information. Pt Education - [facility] Mastectomy HCI

## 2020-01-11 NOTE — Progress Notes (Signed)
Clinical Social Work [facility] Psychosocial Distress Screening [facility]   Patient completed distress screening protocol and scored a 4 on the Psychosocial Distress Thermometer which indicates mild distress. Clinical Social Worker met with patient in [facility] to assess for distress and other psychosocial needs.  Patient stated she was feeling overwhelmed but felt better after meeting with the treatment team and getting more information on her treatment plan and that she will not need chemotherapy.  Feels she was more "nonchalant" after her first diagnosis in 2009, but has been more worried with this recurrence.   Strong supports in husband and best friends. Has two sons who she told this morning about her diagnosis. She also helps care for her parents (mom 90yo, dad 93 yo w dementia) and visits them every 4-6 weeks.  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  CSW and patient discussed common feeling and emotions when being diagnosed with cancer, and the importance of support during treatment.  CSW informed patient of the support team and support services at [facility].  CSW provided contact information and encouraged patient to call with any questions or concerns.    Distress Screen: ONCBCN DISTRESS SCREENING 01/11/2020  Screening Type Initial Screening  Distress experienced in past week (1-10) 4  Practical problem type Work/school  Emotional problem type Nervousness/Anxiety;Adjusting to illness     Michelle E Stoisits  Clinical Social Worker [facility]         

## 2020-01-12 ENCOUNTER — Telehealth: Payer: Self-pay | Admitting: Oncology

## 2020-01-12 ENCOUNTER — Encounter: Payer: Self-pay | Admitting: Genetic Counselor

## 2020-01-12 ENCOUNTER — Encounter: Payer: Self-pay | Admitting: Family Medicine

## 2020-01-12 DIAGNOSIS — Z801 Family history of malignant neoplasm of trachea, bronchus and lung: Secondary | ICD-10-CM | POA: Insufficient documentation

## 2020-01-12 DIAGNOSIS — Z807 Family history of other malignant neoplasms of lymphoid, hematopoietic and related tissues: Secondary | ICD-10-CM | POA: Insufficient documentation

## 2020-01-12 DIAGNOSIS — Z808 Family history of malignant neoplasm of other organs or systems: Secondary | ICD-10-CM | POA: Insufficient documentation

## 2020-01-12 DIAGNOSIS — Z803 Family history of malignant neoplasm of breast: Secondary | ICD-10-CM | POA: Insufficient documentation

## 2020-01-12 NOTE — Telephone Encounter (Signed)
I talk with patient regarding schedule  

## 2020-01-12 NOTE — Progress Notes (Signed)
REFERRING PROVIDER: Chauncey Cruel, MD [place],  [place] [address]  PRIMARY PROVIDER:  [name], MD  PRIMARY REASON FOR VISIT:  1. Malignant neoplasm of upper-outer quadrant of right breast in female, estrogen receptor positive ([facility])   2. History of right breast cancer   3. Melanoma of skin ([place])   4. Family history of breast cancer   5. Family history of skin cancer   6. Family history of brain cancer   7. Family history of lung cancer   8. Family history of multiple myeloma      I connected with Ms. Bogucki on 01/12/2020 at 3:15 pm EDT by Webex video conference and verified that I am speaking with the correct person using two identifiers.   Patient location: [facility] clinic Provider location: [facility] office  HISTORY OF PRESENT ILLNESS:   Ms. Laflamme, a 69 y.o. female, was seen for a [place] cancer genetics consultation at the request of Dr. Jana Hakim due to a personal and family history of breast cancer.  Ms. Savoia presents to clinic today to discuss the possibility of a hereditary predisposition to cancer, genetic testing, and to further clarify her future cancer risks, as well as potential cancer risks for family members.   In 2009, at the age of 81, Ms. Drum was diagnosed with DCIS, ER+/PR+, of the right breast. The treatment plan included lumpectomy and radiation. In August of 2020, Ms. Neiswender was diagnosed with melanoma on her breast. The melanoma was surgically excised and required no further treatment.  More Recently, in 2021 at the age of 69, she was diagnosed with IDC, ER+/PR+/Her2+, of the right breast. The treatment plan includes surgery, radiation therapy, trastuzumab, and antiestrogens.   CANCER HISTORY:  Oncology History  Malignant neoplasm of upper-outer quadrant of right breast in female, estrogen receptor positive ([place])  01/04/2020 Initial Diagnosis   Malignant neoplasm of upper-outer quadrant of right breast in female, estrogen  receptor positive ([place])   02/15/2020 -  Chemotherapy   The patient had trastuzumab-dkst (OGIVRI) 567 mg in sodium chloride 0.9 % 250 mL chemo infusion, 8 mg/kg = 567 mg, Intravenous,  Once, 0 of 9 cycles  for chemotherapy treatment.       RISK FACTORS:  Menarche was at age 81.  First live birth at age 48.  OCP use for approximately 5 years.  Ovaries intact: yes.  Hysterectomy: no.  Menopausal status: postmenopausal.  HRT use: 0 years. Colonoscopy: yes; last colonoscopy in 2010, normal cologuard last year. Mammogram within the last year: yes. Any excessive radiation exposure in the past: no  Past Medical History:  Diagnosis Date  . Anxiety   . Breast cancer ([place]) 2009   right  . Cancer ([place])   . COUGH, CHRONIC 05/10/2010  . Dysrhythmia    occationally "skips a beat"  . Family history of brain cancer   . Family history of breast cancer   . Family history of lung cancer   . Family history of multiple myeloma   . Family history of skin cancer   . GERD (gastroesophageal reflux disease)   . HYPERTENSION 04/16/2009  . HYPOTHYROIDISM 04/16/2009  . OSTEOARTHRITIS 04/16/2009  . Personal history of radiation therapy   . SENILE LENTIGO 06/10/2010    Past Surgical History:  Procedure Laterality Date  . BREAST EXCISIONAL BIOPSY Right   . BREAST EXCISIONAL BIOPSY Left   . BREAST LUMPECTOMY Right 2009   radiation only  . BREAST SURGERY  2009  X 2, cancer stage 0, lumpectomy  . [place]  . CHOLECYSTECTOMY    . DILATATION & CURETTAGE/HYSTEROSCOPY WITH MYOSURE  11/2015  . HERNIA REPAIR  2009  . JOINT REPLACEMENT  2008   hip  . OPEN SURGICAL REPAIR OF GLUTEAL TENDON Left 07/18/2019   Procedure: Left hip bearing surface revision with gluteal tendon repair;  Surgeon: Gaynelle Arabian, MD;  Location: [facility];  Service: Orthopedics;  Laterality: Left;  2 hrs    Social History   Socioeconomic History  . Marital status: Married    Spouse name: Not on file  .  Number of children: 2  . Years of education: Not on file  . Highest education level: Not on file  Occupational History  . Occupation: Cabin crew    CommentAssociate Professor  . 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 Smoking status: Never Smoker  . Smokeless tobacco: Never Used  . Tobacco comment: father smoked when she was younger   Substance and Sexual Activity  . Alcohol use: Yes    Comment: occasionally   . Drug use: No  . Sexual activity: Not on file  Other Topics Concern  . Not on file  Social History Narrative   Lives with husband on 2 level house, has two sons, one local with grandkids   Works FT as Cabin crew; Psychiatrist   Enjoys working out at gym about 2X/week, attends silver sneakers classes   Dad struggles with dementia, mother still living who is caretaker; occasionally travels to visit to assist mother.   Social Determinants of Health   Financial Resource Strain:   . Difficulty of Paying Living Expenses: Not on file  Food Insecurity:   . Worried About Charity fundraiser in the Last Year: Not on file  . Ran Out of Food in the Last Year: Not on file  Transportation Needs:   . Lack of Transportation (Medical): Not on file  . Lack of Transportation (Non-Medical): Not on file  Physical Activity:   . Days of Exercise per Week: Not on file  . Minutes of Exercise per Session: Not on file  Stress:   . Feeling of Stress : Not on file  Social Connections:   . Frequency of Communication with Friends and Family: Not on file  . Frequency of Social Gatherings with Friends and Family: Not on file  . Attends Religious Services: Not on file  . Active Member of Clubs or Organizations: Not on file  . Attends Archivist Meetings: Not on file  . Marital Status: Not on file     FAMILY HISTORY:  We obtained a detailed, 4-generation family history.  Significant diagnoses are listed below: Family History  Problem Relation Age  of Onset  . Arthritis Other   . Hypertension Other   . Cancer Other        2 aunts  . Hyperlipidemia Father   . Dementia Father   . Skin cancer Father   . Skin cancer Mother   . Breast cancer Maternal Aunt 63  . Breast cancer Maternal Aunt        dx. mid-70s  . Lung cancer Paternal Uncle   . Multiple myeloma Paternal Uncle   . Cancer Paternal Aunt        unknown type, dx. >50  . Brain cancer Cousin        dx. 23s, paternal  cousin   Ms. Slee has two sons, ages 52 and 63. She has one brother who is 53. None of these family members have had cancer.  Ms. Geffrard mother is currently 6 and has had skin cancer. She notes that her mother had a lot of sun exposure in the past. Ms. Travieso had four maternal aunts and one maternal uncle. Two of her maternal aunts had breast cancer, one at age 88 and one in her mid-56s. Her maternal grandmother died at age 37 and her grandfather died in his 37s. There are no other known diagnoses of cancer on the maternal side of the family.  Ms. Fischbach father is currently 76 and has had skin cancer. She notes that her father also had a lot of sun exposure in the past. Ms. Sanjurjo had three paternal aunts and two paternal uncles. One paternal aunt had cancer diagnosed when she was older than 82, although she is not sure what type of cancer this was. This aunt had a daughter who died from brain cancer in her 71s. One of Ms. Eastwood's paternal uncles has a history of lung cancer and multiple myeloma. Both of her paternal grandparents died in their 85s. There are no other known diagnoses of cancer on the paternal side of the family.  Ms. Mogel is unaware of previous family history of genetic testing for hereditary cancer risks. She does not know her ancestry. There is no reported Ashkenazi Jewish ancestry. There is no known consanguinity.  GENETIC COUNSELING ASSESSMENT: Ms. Brierley is a 69 y.o. female with a personal and family history of breast cancer which is  somewhat suggestive of a hereditary cancer syndrome and predisposition to cancer. We, therefore, discussed and recommended the following at today's visit.   DISCUSSION: We discussed that 5-10% of breast cancer is hereditary, with most cases associated with the BRCA1 and BRCA2 genes.  There are other genes that can be associated with hereditary breast cancer syndromes.  These include ATM, CHEK2, PALB2, etc.  We discussed that testing is beneficial for several reasons including knowing about other cancer risks, identifying potential screening and risk-reduction options that may be appropriate, and to understand if other family members could be at risk for cancer and allow them to undergo genetic testing.   We reviewed the characteristics, features and inheritance patterns of hereditary cancer syndromes. We also discussed genetic testing, including the appropriate family members to test, the process of testing, insurance coverage and turn-around-time for results. We discussed the implications of a negative, positive and/or variant of uncertain significant result. In order to get genetic test results in a timely manner so that Ms. Mangiapane can use these genetic test results for surgical decisions, we recommended Ms. Mcdougald pursue genetic testing for the [facility] Breast Cancer STAT panel. We discussed the option to pursue reflex genetic testing for other types of hereditary cancer syndromes, although Ms. Karstens declined reflex testing.   The STAT Breast cancer panel offered by [facility] includes sequencing and rearrangement analysis for the following 9 genes:  ATM, BRCA1, BRCA2, CDH1, CHEK2, PALB2, PTEN, STK11 and TP53.      Based on Ms. Mustapha's personal and family history of cancer, she meets medical criteria for genetic testing. Despite that she meets criteria, she may still have an out of pocket cost.   PLAN: After considering the risks, benefits, and limitations, Ms. Lindler provided informed consent to pursue  genetic testing and the blood sample was sent to [facility] for analysis of the Breast Cancer  STAT panel. Results should be available within approximately one-two weeks' time, at which point they will be disclosed by telephone to Ms. Hornung, as will any additional recommendations warranted by these results. Ms. Celmer will receive a summary of her genetic counseling visit and a copy of her results once available. This information will also be available in Epic.   Ms. Cragin questions were answered to her satisfaction today. Our contact information was provided should additional questions or concerns arise. Thank you for the referral and allowing [place] to share in the care of your patient.   Clint Guy, MS, [facility] Genetic Counselor [email or web address] Phone: [phone]  The patient was seen for a total of 20 minutes in face-to-face genetic counseling.  This patient was discussed with Drs. [name], Lindi Adie and/or Burr Medico who agrees with the above.    _______________________________________________________________________ For Office Staff:  Number of people involved in session: 1 Was an Intern/ student involved with case: no

## 2020-01-13 ENCOUNTER — Other Ambulatory Visit: Payer: Self-pay | Admitting: Surgery

## 2020-01-13 ENCOUNTER — Other Ambulatory Visit: Payer: Self-pay | Admitting: Oncology

## 2020-01-13 DIAGNOSIS — C50911 Malignant neoplasm of unspecified site of right female breast: Secondary | ICD-10-CM

## 2020-01-17 ENCOUNTER — Other Ambulatory Visit: Payer: Self-pay

## 2020-01-17 ENCOUNTER — Ambulatory Visit (HOSPITAL_COMMUNITY)
Admission: RE | Admit: 2020-01-17 | Discharge: 2020-01-17 | Disposition: A | Discharge status: Home / Self Care | Payer: Medicare Other | Source: Ambulatory Visit | Attending: Oncology | Admitting: Oncology

## 2020-01-17 DIAGNOSIS — C50411 Malignant neoplasm of upper-outer quadrant of right female breast: Secondary | ICD-10-CM | POA: Diagnosis not present

## 2020-01-17 DIAGNOSIS — Z01818 Encounter for other preprocedural examination: Secondary | ICD-10-CM | POA: Diagnosis not present

## 2020-01-17 DIAGNOSIS — Z170 Estrogen receptor positive status [ER+]: Secondary | ICD-10-CM | POA: Insufficient documentation

## 2020-01-17 NOTE — Progress Notes (Signed)
  Echocardiogram 2D Echocardiogram has been performed.  Theresa Bradley 01/17/2020, 10:33 AM

## 2020-01-18 ENCOUNTER — Telehealth: Payer: Self-pay | Admitting: Genetic Counselor

## 2020-01-18 NOTE — Telephone Encounter (Signed)
Discussed with Theresa Bradley that the genetic testing laboratory has not yet received her blood sample, and that we will need to scheduled a time to redraw her blood for the genetic testing. She will give me a call tomorrow morning to schedule a time when she has her calendar in front of her.

## 2020-01-19 ENCOUNTER — Telehealth: Payer: Self-pay | Admitting: *Deleted

## 2020-01-19 DIAGNOSIS — J3089 Other allergic rhinitis: Secondary | ICD-10-CM | POA: Diagnosis not present

## 2020-01-19 DIAGNOSIS — J3081 Allergic rhinitis due to animal (cat) (dog) hair and dander: Secondary | ICD-10-CM | POA: Diagnosis not present

## 2020-01-19 DIAGNOSIS — J301 Allergic rhinitis due to pollen: Secondary | ICD-10-CM | POA: Diagnosis not present

## 2020-01-19 NOTE — Telephone Encounter (Signed)
Left message for a return phone call to follow up from [facility]. °

## 2020-01-24 ENCOUNTER — Other Ambulatory Visit: Payer: Self-pay

## 2020-01-24 ENCOUNTER — Other Ambulatory Visit: Payer: Self-pay | Admitting: Genetic Counselor

## 2020-01-24 ENCOUNTER — Encounter (HOSPITAL_BASED_OUTPATIENT_CLINIC_OR_DEPARTMENT_OTHER): Payer: Self-pay | Admitting: Surgery

## 2020-01-24 ENCOUNTER — Inpatient Hospital Stay: Payer: Medicare Other | Attending: Oncology

## 2020-01-24 DIAGNOSIS — Z170 Estrogen receptor positive status [ER+]: Secondary | ICD-10-CM

## 2020-01-24 DIAGNOSIS — C50111 Malignant neoplasm of central portion of right female breast: Secondary | ICD-10-CM | POA: Insufficient documentation

## 2020-01-24 DIAGNOSIS — C50411 Malignant neoplasm of upper-outer quadrant of right female breast: Secondary | ICD-10-CM

## 2020-01-24 LAB — GENETIC SCREENING ORDER

## 2020-01-25 ENCOUNTER — Telehealth: Payer: Self-pay | Admitting: Genetic Counselor

## 2020-01-25 ENCOUNTER — Telehealth: Payer: Self-pay

## 2020-01-25 NOTE — Progress Notes (Signed)

## 2020-01-25 NOTE — Telephone Encounter (Signed)
Nutrition Assessment  Reason for Assessment:  Pt attended Breast Clinic on 01/11/20  ASSESSMENT:  69 year old female with new diagnosis of breast cancer.  Planning lumpectomy, radiation, antiestrogens, herceptin.    Patient returned RD's call.  Patient reports good appetite and trying to cut back on sugar intake  Medications:  reviewed  Labs: reviewed  Anthropometrics:   Height: 64.5 inches Weight: 158 lb BMI: 27   NUTRITION DIAGNOSIS: Food and nutrition related knowledge deficit related to new diagnosis of breast cancer as evidenced by no prior need for nutrition related information.  INTERVENTION:   Discussed briefly packet of information regarding nutritional tips for breast cancer patients.  Questions answered.  Contact information provided and patient knows to contact me with questions/concerns.    MONITORING, EVALUATION, and GOAL: Pt will consume a healthy plant based diet to maintain lean body mass throughout treatment.   Theresa Bradley, [place], [place] Registered Dietitian [phone] (pager)

## 2020-01-25 NOTE — Telephone Encounter (Signed)
Nutrition  Patient identified by attending Breast Clinic on 01/11/20.   Chart reviewed.  Called patient to introduce self and service at [facility].  No answer. Left message with call back number  Marchetta Navratil B. Zenia Resides, [place], [place] Registered Dietitian [phone] (pager)

## 2020-01-25 NOTE — Telephone Encounter (Signed)
Called Theresa Bradley to discuss her billing concerns regarding genetic testing. Reviewed the genetic testing laboratory's ([facility]) billing policy and confirmed that sending in a second blood sample will not impact how her genetic test is billed.   Also verified that [facility] did receive her redrawn blood sample this morning and the test is currently processing. Despite this, it is possible that we may not receive her results before her surgery date. Theresa Bradley understands this possibility.

## 2020-01-25 NOTE — Progress Notes (Signed)
Potassium of 3.2 reviewed by Dr. Kalman Shan.  Will proceed with surgery as scheduled.

## 2020-01-26 DIAGNOSIS — J3089 Other allergic rhinitis: Secondary | ICD-10-CM | POA: Diagnosis not present

## 2020-01-26 DIAGNOSIS — J301 Allergic rhinitis due to pollen: Secondary | ICD-10-CM | POA: Diagnosis not present

## 2020-01-26 DIAGNOSIS — J3081 Allergic rhinitis due to animal (cat) (dog) hair and dander: Secondary | ICD-10-CM | POA: Diagnosis not present

## 2020-01-27 ENCOUNTER — Other Ambulatory Visit (HOSPITAL_COMMUNITY)
Admission: RE | Admit: 2020-01-27 | Discharge: 2020-01-27 | Disposition: A | Discharge status: Home / Self Care | Payer: Medicare Other | Source: Ambulatory Visit | Attending: Surgery | Admitting: Surgery

## 2020-01-27 DIAGNOSIS — Z20822 Contact with and (suspected) exposure to covid-19: Secondary | ICD-10-CM | POA: Diagnosis not present

## 2020-01-27 DIAGNOSIS — Z01812 Encounter for preprocedural laboratory examination: Secondary | ICD-10-CM | POA: Diagnosis not present

## 2020-01-27 LAB — SARS CORONAVIRUS 2 (TAT 6-24 HRS): SARS Coronavirus 2: NEGATIVE

## 2020-01-30 ENCOUNTER — Ambulatory Visit
Admission: RE | Admit: 2020-01-30 | Discharge: 2020-01-30 | Disposition: A | Discharge status: Home / Self Care | Payer: Medicare Other | Source: Ambulatory Visit | Attending: Surgery | Admitting: Surgery

## 2020-01-30 ENCOUNTER — Other Ambulatory Visit: Payer: Self-pay

## 2020-01-30 DIAGNOSIS — D0511 Intraductal carcinoma in situ of right breast: Secondary | ICD-10-CM | POA: Diagnosis not present

## 2020-01-30 DIAGNOSIS — C50911 Malignant neoplasm of unspecified site of right female breast: Secondary | ICD-10-CM

## 2020-01-30 DIAGNOSIS — Z170 Estrogen receptor positive status [ER+]: Secondary | ICD-10-CM

## 2020-01-31 ENCOUNTER — Ambulatory Visit (HOSPITAL_BASED_OUTPATIENT_CLINIC_OR_DEPARTMENT_OTHER)
Admission: RE | Admit: 2020-01-31 | Discharge: 2020-01-31 | Disposition: A | Discharge status: Home / Self Care | Payer: Medicare Other | Attending: Surgery | Admitting: Surgery

## 2020-01-31 ENCOUNTER — Ambulatory Visit (HOSPITAL_BASED_OUTPATIENT_CLINIC_OR_DEPARTMENT_OTHER): Payer: Medicare Other | Admitting: Anesthesiology

## 2020-01-31 ENCOUNTER — Other Ambulatory Visit: Payer: Self-pay

## 2020-01-31 ENCOUNTER — Encounter: Payer: Self-pay | Admitting: *Deleted

## 2020-01-31 ENCOUNTER — Ambulatory Visit (HOSPITAL_COMMUNITY)
Admission: RE | Admit: 2020-01-31 | Discharge: 2020-01-31 | Disposition: A | Discharge status: Home / Self Care | Payer: Medicare Other | Source: Ambulatory Visit | Attending: Surgery | Admitting: Surgery

## 2020-01-31 ENCOUNTER — Encounter (HOSPITAL_BASED_OUTPATIENT_CLINIC_OR_DEPARTMENT_OTHER)
Admission: RE | Disposition: A | Discharge status: Home / Self Care | Payer: Self-pay | Source: Home / Self Care | Attending: Surgery

## 2020-01-31 ENCOUNTER — Ambulatory Visit
Admission: RE | Admit: 2020-01-31 | Discharge: 2020-01-31 | Disposition: A | Discharge status: Home / Self Care | Payer: Medicare Other | Source: Ambulatory Visit | Attending: Surgery | Admitting: Surgery

## 2020-01-31 ENCOUNTER — Encounter (HOSPITAL_BASED_OUTPATIENT_CLINIC_OR_DEPARTMENT_OTHER): Payer: Self-pay | Admitting: Surgery

## 2020-01-31 DIAGNOSIS — J3081 Allergic rhinitis due to animal (cat) (dog) hair and dander: Secondary | ICD-10-CM | POA: Diagnosis not present

## 2020-01-31 DIAGNOSIS — C50911 Malignant neoplasm of unspecified site of right female breast: Secondary | ICD-10-CM | POA: Diagnosis not present

## 2020-01-31 DIAGNOSIS — Z853 Personal history of malignant neoplasm of breast: Secondary | ICD-10-CM | POA: Insufficient documentation

## 2020-01-31 DIAGNOSIS — J3089 Other allergic rhinitis: Secondary | ICD-10-CM | POA: Diagnosis not present

## 2020-01-31 DIAGNOSIS — C50411 Malignant neoplasm of upper-outer quadrant of right female breast: Secondary | ICD-10-CM | POA: Insufficient documentation

## 2020-01-31 DIAGNOSIS — Z803 Family history of malignant neoplasm of breast: Secondary | ICD-10-CM | POA: Diagnosis not present

## 2020-01-31 DIAGNOSIS — I10 Essential (primary) hypertension: Secondary | ICD-10-CM | POA: Diagnosis not present

## 2020-01-31 DIAGNOSIS — Z923 Personal history of irradiation: Secondary | ICD-10-CM | POA: Diagnosis not present

## 2020-01-31 DIAGNOSIS — J301 Allergic rhinitis due to pollen: Secondary | ICD-10-CM | POA: Diagnosis not present

## 2020-01-31 DIAGNOSIS — G8918 Other acute postprocedural pain: Secondary | ICD-10-CM | POA: Diagnosis not present

## 2020-01-31 DIAGNOSIS — Z170 Estrogen receptor positive status [ER+]: Secondary | ICD-10-CM | POA: Diagnosis not present

## 2020-01-31 DIAGNOSIS — D0511 Intraductal carcinoma in situ of right breast: Secondary | ICD-10-CM | POA: Diagnosis not present

## 2020-01-31 HISTORY — PX: BREAST LUMPECTOMY WITH RADIOACTIVE SEED AND SENTINEL LYMPH NODE BIOPSY: SHX6550

## 2020-01-31 SURGERY — BREAST LUMPECTOMY WITH RADIOACTIVE SEED AND SENTINEL LYMPH NODE BIOPSY
Anesthesia: General | Site: Breast | Laterality: Right

## 2020-01-31 MED ORDER — BUPIVACAINE HCL (PF) 0.25 % IJ SOLN
INTRAMUSCULAR | Status: DC | PRN
  Administered 2020-01-31: 11 mL

## 2020-01-31 MED ORDER — DEXAMETHASONE SODIUM PHOSPHATE 4 MG/ML IJ SOLN
INTRAMUSCULAR | Status: DC | PRN
  Administered 2020-01-31: 10 mg via INTRAVENOUS

## 2020-01-31 MED ORDER — SODIUM CHLORIDE (PF) 0.9 % IJ SOLN
INTRAVENOUS | Status: DC | PRN
  Administered 2020-01-31: 5 mL via INTRAMUSCULAR

## 2020-01-31 MED ORDER — SODIUM CHLORIDE (PF) 0.9 % IJ SOLN
INTRAMUSCULAR | Status: AC
  Filled 2020-01-31: qty 10

## 2020-01-31 MED ORDER — MIDAZOLAM HCL 5 MG/5ML IJ SOLN
INTRAMUSCULAR | Status: DC | PRN
  Administered 2020-01-31: 2 mg via INTRAVENOUS

## 2020-01-31 MED ORDER — FENTANYL CITRATE (PF) 100 MCG/2ML IJ SOLN: 25.0000 ug | INTRAMUSCULAR | Status: DC | PRN

## 2020-01-31 MED ORDER — ONDANSETRON HCL 4 MG/2ML IJ SOLN
INTRAMUSCULAR | Status: AC
  Filled 2020-01-31: qty 2

## 2020-01-31 MED ORDER — FENTANYL CITRATE (PF) 100 MCG/2ML IJ SOLN
INTRAMUSCULAR | Status: DC | PRN
  Administered 2020-01-31: 100 ug via INTRAVENOUS

## 2020-01-31 MED ORDER — METHYLENE BLUE 0.5 % INJ SOLN
INTRAVENOUS | Status: AC
  Filled 2020-01-31: qty 10

## 2020-01-31 MED ORDER — LACTATED RINGERS IV SOLN: INTRAVENOUS | Status: DC

## 2020-01-31 MED ORDER — FENTANYL CITRATE (PF) 100 MCG/2ML IJ SOLN
50.0000 ug | INTRAMUSCULAR | Status: DC | PRN
  Administered 2020-01-31: 11:00:00 100 ug via INTRAVENOUS

## 2020-01-31 MED ORDER — CELECOXIB 200 MG PO CAPS
ORAL_CAPSULE | ORAL | Status: AC
  Filled 2020-01-31: qty 1

## 2020-01-31 MED ORDER — ACETAMINOPHEN 500 MG PO TABS
ORAL_TABLET | ORAL | Status: AC
  Filled 2020-01-31: qty 2

## 2020-01-31 MED ORDER — CHLORHEXIDINE GLUCONATE CLOTH 2 % EX PADS: 6.0000 | MEDICATED_PAD | Freq: Once | CUTANEOUS | Status: DC

## 2020-01-31 MED ORDER — HYDROCODONE-ACETAMINOPHEN 5-325 MG PO TABS: 1.0000 | ORAL_TABLET | Freq: Four times a day (QID) | ORAL | 0 refills | Status: DC | PRN

## 2020-01-31 MED ORDER — FENTANYL CITRATE (PF) 100 MCG/2ML IJ SOLN
INTRAMUSCULAR | Status: AC
  Filled 2020-01-31: qty 2

## 2020-01-31 MED ORDER — PROPOFOL 10 MG/ML IV BOLUS
INTRAVENOUS | Status: DC | PRN
  Administered 2020-01-31: 150 mg via INTRAVENOUS

## 2020-01-31 MED ORDER — LIDOCAINE HCL (CARDIAC) PF 100 MG/5ML IV SOSY
PREFILLED_SYRINGE | INTRAVENOUS | Status: DC | PRN
  Administered 2020-01-31: 10 mg via INTRATRACHEAL

## 2020-01-31 MED ORDER — PROMETHAZINE HCL 25 MG/ML IJ SOLN: 6.2500 mg | INTRAMUSCULAR | Status: DC | PRN

## 2020-01-31 MED ORDER — OXYCODONE HCL 5 MG PO TABS: 5.0000 mg | ORAL_TABLET | Freq: Once | ORAL | Status: DC | PRN

## 2020-01-31 MED ORDER — OXYCODONE HCL 5 MG/5ML PO SOLN: 5.0000 mg | Freq: Once | ORAL | Status: DC | PRN

## 2020-01-31 MED ORDER — DEXTROSE 5 % IV SOLN
3.0000 g | INTRAVENOUS | Status: AC
  Administered 2020-01-31: 2 g via INTRAVENOUS

## 2020-01-31 MED ORDER — BUPIVACAINE LIPOSOME 1.3 % IJ SUSP
INTRAMUSCULAR | Status: DC | PRN
  Administered 2020-01-31: 10 mL

## 2020-01-31 MED ORDER — BUPIVACAINE-EPINEPHRINE (PF) 0.25% -1:200000 IJ SOLN
INTRAMUSCULAR | Status: DC | PRN
  Administered 2020-01-31: 30 mL

## 2020-01-31 MED ORDER — MIDAZOLAM HCL 2 MG/2ML IJ SOLN
INTRAMUSCULAR | Status: AC
  Filled 2020-01-31: qty 2

## 2020-01-31 MED ORDER — DEXAMETHASONE SODIUM PHOSPHATE 10 MG/ML IJ SOLN
INTRAMUSCULAR | Status: AC
  Filled 2020-01-31: qty 1

## 2020-01-31 MED ORDER — LIDOCAINE 2% (20 MG/ML) 5 ML SYRINGE
INTRAMUSCULAR | Status: AC
  Filled 2020-01-31: qty 5

## 2020-01-31 MED ORDER — EPHEDRINE SULFATE 50 MG/ML IJ SOLN
INTRAMUSCULAR | Status: DC | PRN
  Administered 2020-01-31: 25 mg via INTRAVENOUS

## 2020-01-31 MED ORDER — TECHNETIUM TC 99M SULFUR COLLOID FILTERED
1.0000 | Freq: Once | INTRAVENOUS | Status: AC | PRN
  Administered 2020-01-31: 1 via INTRADERMAL

## 2020-01-31 MED ORDER — CEFAZOLIN SODIUM-DEXTROSE 2-4 GM/100ML-% IV SOLN
INTRAVENOUS | Status: AC
  Filled 2020-01-31: qty 100

## 2020-01-31 MED ORDER — CELECOXIB 200 MG PO CAPS
200.0000 mg | ORAL_CAPSULE | ORAL | Status: AC
  Administered 2020-01-31: 200 mg via ORAL

## 2020-01-31 MED ORDER — MIDAZOLAM HCL 2 MG/2ML IJ SOLN
1.0000 mg | INTRAMUSCULAR | Status: DC | PRN
  Administered 2020-01-31: 2 mg via INTRAVENOUS

## 2020-01-31 MED ORDER — ACETAMINOPHEN 500 MG PO TABS
1000.0000 mg | ORAL_TABLET | ORAL | Status: AC
  Administered 2020-01-31: 1000 mg via ORAL

## 2020-01-31 MED ORDER — PROPOFOL 10 MG/ML IV BOLUS
INTRAVENOUS | Status: AC
  Filled 2020-01-31: qty 20

## 2020-01-31 MED ORDER — ONDANSETRON HCL 4 MG/2ML IJ SOLN
INTRAMUSCULAR | Status: DC | PRN
  Administered 2020-01-31: 4 mg via INTRAVENOUS

## 2020-01-31 SURGICAL SUPPLY — 44 items
ADH SKN CLS APL DERMABOND .7 (GAUZE/BANDAGES/DRESSINGS) ×1
APL PRP STRL LF DISP 70% ISPRP (MISCELLANEOUS) ×1
APPLIER CLIP 9.375 MED OPEN (MISCELLANEOUS) ×2
APR CLP MED 9.3 20 MLT OPN (MISCELLANEOUS) ×1
BINDER BREAST XLRG (GAUZE/BANDAGES/DRESSINGS) ×1 IMPLANT
BLADE SURG 15 STRL LF DISP TIS (BLADE) ×1 IMPLANT
BLADE SURG 15 STRL SS (BLADE) ×2
CANISTER SUCT 1200ML W/VALVE (MISCELLANEOUS) ×2 IMPLANT
CHLORAPREP W/TINT 26 (MISCELLANEOUS) ×2 IMPLANT
CLIP APPLIE 9.375 MED OPEN (MISCELLANEOUS) ×1 IMPLANT
COVER BACK TABLE 60X90IN (DRAPES) ×2 IMPLANT
COVER MAYO STAND STRL (DRAPES) ×2 IMPLANT
COVER PROBE W GEL 5X96 (DRAPES) ×2 IMPLANT
DERMABOND ADVANCED (GAUZE/BANDAGES/DRESSINGS) ×1
DERMABOND ADVANCED .7 DNX12 (GAUZE/BANDAGES/DRESSINGS) ×1 IMPLANT
DRAPE LAPAROSCOPIC ABDOMINAL (DRAPES) ×2 IMPLANT
DRAPE UTILITY XL STRL (DRAPES) ×2 IMPLANT
ELECT COATED BLADE 2.86 ST (ELECTRODE) ×2 IMPLANT
ELECT REM PT RETURN 9FT ADLT (ELECTROSURGICAL) ×2
ELECTRODE REM PT RTRN 9FT ADLT (ELECTROSURGICAL) ×1 IMPLANT
GLOVE BIO SURGEON STRL SZ 6.5 (GLOVE) ×1 IMPLANT
GLOVE BIOGEL PI IND STRL 7.0 (GLOVE) IMPLANT
GLOVE BIOGEL PI IND STRL 8 (GLOVE) ×1 IMPLANT
GLOVE BIOGEL PI INDICATOR 7.0 (GLOVE) ×1
GLOVE BIOGEL PI INDICATOR 8 (GLOVE) ×1
GLOVE ECLIPSE 8.0 STRL XLNG CF (GLOVE) ×2 IMPLANT
GLOVE SURG SS PI 7.0 STRL IVOR (GLOVE) ×1 IMPLANT
GOWN STRL REUS W/ TWL LRG LVL3 (GOWN DISPOSABLE) ×2 IMPLANT
GOWN STRL REUS W/TWL LRG LVL3 (GOWN DISPOSABLE) ×4
KIT MARKER MARGIN INK (KITS) ×2 IMPLANT
NDL HYPO 25X1 1.5 SAFETY (NEEDLE) ×1 IMPLANT
NEEDLE HYPO 25X1 1.5 SAFETY (NEEDLE) ×2 IMPLANT
NS IRRIG 1000ML POUR BTL (IV SOLUTION) ×2 IMPLANT
PACK BASIN DAY SURGERY FS (CUSTOM PROCEDURE TRAY) ×2 IMPLANT
PENCIL SMOKE EVACUATOR (MISCELLANEOUS) ×2 IMPLANT
SLEEVE SCD COMPRESS KNEE MED (MISCELLANEOUS) ×2 IMPLANT
SPONGE LAP 4X18 RFD (DISPOSABLE) ×2 IMPLANT
SUT MNCRL AB 4-0 PS2 18 (SUTURE) ×2 IMPLANT
SUT VICRYL 3-0 CR8 SH (SUTURE) ×2 IMPLANT
SYR CONTROL 10ML LL (SYRINGE) ×2 IMPLANT
TOWEL GREEN STERILE FF (TOWEL DISPOSABLE) ×2 IMPLANT
TRAY FAXITRON CT DISP (TRAY / TRAY PROCEDURE) ×2 IMPLANT
TUBE CONNECTING 20X1/4 (TUBING) ×2 IMPLANT
YANKAUER SUCT BULB TIP NO VENT (SUCTIONS) ×2 IMPLANT

## 2020-01-31 NOTE — Progress Notes (Signed)
Assisted Dr. Oddono with right, ultrasound guided, pectoralis block. Side rails up, monitors on throughout procedure. See vital signs in flow sheet. Tolerated Procedure well. 

## 2020-01-31 NOTE — Interval H&P Note (Signed)
History and Physical Interval Note:  01/31/2020 10:27 AM  Theresa Bradley  has presented today for surgery, with the diagnosis of RIGHT BREAST CANCER.  The various methods of treatment have been discussed with the patient and family. After consideration of risks, benefits and other options for treatment, the patient has consented to  Procedure(s) with comments: RIGHT BREAST LUMPECTOMY WITH RADIOACTIVE SEED AND SENTINEL LYMPH NODE BIOPSY (Right) - PEC BLOCK as a surgical intervention.  The patient's history has been reviewed, patient examined, no change in status, stable for surgery.  I have reviewed the patient's chart and labs.  Questions were answered to the patient's satisfaction.     [place]

## 2020-01-31 NOTE — Anesthesia Preprocedure Evaluation (Addendum)
Anesthesia Evaluation  Patient identified by MRN, date of birth, ID band Patient awake    Reviewed: Allergy & Precautions, NPO status , Patient's Chart, lab work & pertinent test results  History of Anesthesia Complications Negative for: history of anesthetic complications  Airway Mallampati: II  TM Distance: >3 FB Neck ROM: Full    Dental no notable dental hx. (+) Teeth Intact, Dental Advisory Given   Pulmonary neg pulmonary ROS,    Pulmonary exam normal breath sounds clear to auscultation       Cardiovascular hypertension, Pt. on medications negative cardio ROS Normal cardiovascular exam Rhythm:Regular Rate:Normal     Neuro/Psych Anxiety negative neurological ROS  negative psych ROS   GI/Hepatic negative GI ROS, Neg liver ROS, GERD  Medicated and Controlled,  Endo/Other  negative endocrine ROSHypothyroidism   Renal/GU negative Renal ROS  negative genitourinary   Musculoskeletal negative musculoskeletal ROS (+) Arthritis ,   Abdominal   Peds negative pediatric ROS (+)  Hematology negative hematology ROS (+)   Anesthesia Other Findings Right breast ca  Reproductive/Obstetrics negative OB ROS                           Anesthesia Physical Anesthesia Plan  ASA: II  Anesthesia Plan: General   Post-op Pain Management: GA combined w/ Regional for post-op pain   Induction: Intravenous  PONV Risk Score and Plan: 3 and Treatment may vary due to age or medical condition, Ondansetron and Dexamethasone  Airway Management Planned: LMA  Additional Equipment: None  Intra-op Plan:   Post-operative Plan: Extubation in OR  Informed Consent:   Plan Discussed with: Anesthesiologist and CRNA  Anesthesia Plan Comments:        Anesthesia Quick Evaluation

## 2020-01-31 NOTE — Progress Notes (Signed)
Nuc med injections completed by nuc med staff.  VS stable. Emotional support provided. Pt tolerated well.

## 2020-01-31 NOTE — Anesthesia Procedure Notes (Signed)
Procedure Name: LMA Insertion Date/Time: 01/31/2020 11:08 AM Performed by: Marrianne Mood, CRNA Pre-anesthesia Checklist: Patient identified, Emergency Drugs available, Suction available and Patient being monitored Patient Re-evaluated:Patient Re-evaluated prior to induction Oxygen Delivery Method: Circle system utilized Preoxygenation: Pre-oxygenation with 100% oxygen Induction Type: IV induction Ventilation: Mask ventilation without difficulty LMA: LMA inserted LMA Size: 4.0 Number of attempts: 1 Airway Equipment and Method: Bite block Placement Confirmation: positive ETCO2 Tube secured with: Tape Dental Injury: Teeth and Oropharynx as per pre-operative assessment

## 2020-01-31 NOTE — Discharge Instructions (Signed)
[place] Office Phone Number [phone]  BREAST BIOPSY/ PARTIAL MASTECTOMY: POST OP INSTRUCTIONS  Always review your discharge instruction sheet given to you by the facility where your surgery was performed.  IF YOU HAVE DISABILITY OR FAMILY LEAVE FORMS, YOU MUST BRING THEM TO THE OFFICE FOR PROCESSING.  DO NOT GIVE THEM TO YOUR DOCTOR.  1. A prescription for pain medication may be given to you upon discharge.  Take your pain medication as prescribed, if needed.  If narcotic pain medicine is not needed, then you may take acetaminophen (Tylenol) or ibuprofen (Advil) as needed. *No Ibuprofen/Motrin until after 6 PM 2. Take your usually prescribed medications unless otherwise directed 3. If you need a refill on your pain medication, please contact your pharmacy.  They will contact our office to request authorization.  Prescriptions will not be filled after 5pm or on week-ends. 4. You should eat very light the first 24 hours after surgery, such as soup, crackers, pudding, etc.  Resume your normal diet the day after surgery. 5. Most patients will experience some swelling and bruising in the breast.  Ice packs and a good support bra will help.  Swelling and bruising can take several days to resolve.  6. It is common to experience some constipation if taking pain medication after surgery.  Increasing fluid intake and taking a stool softener will usually help or prevent this problem from occurring.  A mild laxative (Milk of Magnesia or Miralax) should be taken according to package directions if there are no bowel movements after 48 hours. 7. Unless discharge instructions indicate otherwise, you may remove your bandages 24-48 hours after surgery, and you may shower at that time.  You may have steri-strips (small skin tapes) in place directly over the incision.  These strips should be left on the skin for 7-10 days.  If your surgeon used skin glue on the incision, you may shower in 24  hours.  The glue will flake off over the next 2-3 weeks.  Any sutures or staples will be removed at the office during your follow-up visit. 8. ACTIVITIES:  You may resume regular daily activities (gradually increasing) beginning the next day.  Wearing a good support bra or sports bra minimizes pain and swelling.  You may have sexual intercourse when it is comfortable. a. You may drive when you no longer are taking prescription pain medication, you can comfortably wear a seatbelt, and you can safely maneuver your car and apply brakes. b. RETURN TO WORK:  ______________________________________________________________________________________ 9. You should see your doctor in the office for a follow-up appointment approximately two weeks after your surgery.  Your doctor's nurse will typically make your follow-up appointment when she calls you with your pathology report.  Expect your pathology report 2-3 business days after your surgery.  You may call to check if you do not hear from [place] after three days. 10. OTHER INSTRUCTIONS: _______________________________________________________________________________________________ _____________________________________________________________________________________________________________________________________ _____________________________________________________________________________________________________________________________________ _____________________________________________________________________________________________________________________________________  WHEN TO CALL YOUR DOCTOR: 1. Fever over 101.0 2. Nausea and/or vomiting. 3. Extreme swelling or bruising. 4. Continued bleeding from incision. 5. Increased pain, redness, or drainage from the incision.  The clinic staff is available to answer your questions during regular business hours.  Please don't hesitate to call and ask to speak to one of the nurses for clinical concerns.  If you have a  medical emergency, go to the nearest emergency room or call 911.  A surgeon from [facility] is always on call at the hospital.  For  further questions, please visit [email or web address]   Post Anesthesia Home Care Instructions  Activity: Get plenty of rest for the remainder of the day. A responsible individual must stay with you for 24 hours following the procedure.  For the next 24 hours, DO NOT: -Drive a car -Paediatric nurse -Drink alcoholic beverages -Take any medication unless instructed by your physician -Make any legal decisions or sign important papers.  Meals: Start with liquid foods such as gelatin or soup. Progress to regular foods as tolerated. Avoid greasy, spicy, heavy foods. If nausea and/or vomiting occur, drink only clear liquids until the nausea and/or vomiting subsides. Call your physician if vomiting continues.  Special Instructions/Symptoms: Your throat may feel dry or sore from the anesthesia or the breathing tube placed in your throat during surgery. If this causes discomfort, gargle with warm salt water. The discomfort should disappear within 24 hours.  If you had a scopolamine patch placed behind your ear for the management of post- operative nausea and/or vomiting:  1. The medication in the patch is effective for 72 hours, after which it should be removed.  Wrap patch in a tissue and discard in the trash. Wash hands thoroughly with soap and water. 2. You may remove the patch earlier than 72 hours if you experience unpleasant side effects which may include dry mouth, dizziness or visual disturbances. 3. Avoid touching the patch. Wash your hands with soap and water after contact with the patch.    Information for Discharge Teaching: EXPAREL (bupivacaine liposome injectable suspension)   Your surgeon or anesthesiologist gave you EXPAREL(bupivacaine) to help control your pain after surgery.   EXPAREL is a local anesthetic that provides pain  relief by numbing the tissue around the surgical site.  EXPAREL is designed to release pain medication over time and can control pain for up to 72 hours.  Depending on how you respond to EXPAREL, you may require less pain medication during your recovery.  Possible side effects:  Temporary loss of sensation or ability to move in the area where bupivacaine was injected.  Nausea, vomiting, constipation  Rarely, numbness and tingling in your mouth or lips, lightheadedness, or anxiety may occur.  Call your doctor right away if you think you may be experiencing any of these sensations, or if you have other questions regarding possible side effects.  Follow all other discharge instructions given to you by your surgeon or nurse. Eat a healthy diet and drink plenty of water or other fluids.  If you return to the hospital for any reason within 96 hours following the administration of EXPAREL, it is important for health care providers to know that you have received this anesthetic. A teal colored band has been placed on your arm with the date, time and amount of EXPAREL you have received in order to alert and inform your health care providers. Please leave this armband in place for the full 96 hours following administration, and then you may remove the band.

## 2020-01-31 NOTE — Anesthesia Procedure Notes (Addendum)
Anesthesia Regional Block: Pectoralis block   Pre-Anesthetic Checklist: ,, timeout performed, Correct Patient, Correct Site, Correct Laterality, Correct Procedure, Correct Position, site marked, Risks and benefits discussed,  Surgical consent,  Pre-op evaluation,  At surgeon's request and post-op pain management  Laterality: Right  Prep: chloraprep       Needles:  Injection technique: Single-shot  Needle Type: Echogenic Stimulator Needle     Needle Length: 5cm  Needle Gauge: 22     Additional Needles:   Procedures:, nerve stimulator,,, ultrasound used (permanent image in chart),,,,  Narrative:  Start time: 01/31/2020 10:40 AM End time: 01/31/2020 10:45 AM Injection made incrementally with aspirations every 5 mL.  Performed by: Personally  Anesthesiologist: Janeece Riggers, MD  Additional Notes: Functioning IV was confirmed and monitors were applied.  A 4mm 22ga Arrow echogenic stimulator needle was used. Sterile prep and drape,hand hygiene and sterile gloves were used. Ultrasound guidance: relevant anatomy identified, needle position confirmed, local anesthetic spread visualized around nerve(s)., vascular puncture avoided.  Image printed for medical record. Negative aspiration and negative test dose prior to incremental administration of local anesthetic. The patient tolerated the procedure well.

## 2020-01-31 NOTE — Op Note (Signed)
Preoperative diagnosis: Stage I right breast cancer upper outer quadrant  Postoperative diagnosis: Same  Procedure: Right breast seed localized lumpectomy with right axillary sentinel lymph node mapping utilizing methylene blue dye  Surgeon: [name], MD  Anesthesia: LMA with pectoral block and 0.25% Marcaine local  EBL: Minimal  Specimen: Right breast mass with seed and clip verified in the mass and additional anterior margin.  Second anterior margin included the skin just below the nipple areolar complex and subcutaneous tissue.  Oriented for pathology with ink.  1 right axillary sentinel node blue and hot  Drains: None  Indications for procedure: The patient is a 69 year old female with a 5 mm right breast subareolar mass.  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  She had a strong desire for breast conserving surgery and the case was reviewed by medical oncology, radiation oncology and surgery.  Given the small size of the tumor, we felt attempt at lumpectomy with medical management a good option for her and her wishes.  She understood the role of mastectomy in the setting as well.The procedure has been discussed with the patient. Alternatives to surgery have been discussed with the patient.  Risks of surgery include bleeding,  Infection,  Seroma formation, death,  and the need for further surgery.   The patient understands and wishes to proceed. Sentinel lymph node mapping and dissection has been discussed with the patient.  Risk of bleeding,  Infection,  Seroma formation,  Additional procedures,,  Shoulder weakness ,  Shoulder stiffness,arm swelling   Nerve and blood vessel injury and reaction to the mapping dyes have been discussed.  Alternatives to surgery have been discussed with the patient.  The patient agrees to proceed.  Description of procedure: The patient was met in the holding area  and questions were answered.  Neoprobe used to verify seed location.  She underwent pectoral block on the right and injection of technetium sulfur colloid per radiology protocol.  All questions were answered.  She was then taken back to the operative room.  She is placed supine upon the OR table.  After induction of general esthesia the right breast was prepped and draped in sterile fashion.  Timeout performed.  Proper patient, site and procedure verified.  4 cc of methylene blue dye were injected in the subareolar position and massaged for 5 minutes.  Neoprobe used was sent for iodine settings.  The seed was at 11:00 just below the edge of the nipple areolar complex.  Curvilinear incision was made through an old scar at the site from previous lumpectomy.  Dissection was carried down and all tissue around the seed and clip were excised.  Of note on the Faxitron image the clip was missing.  I took additional anterior margin which is where the clip appeared to be and this was done twice.  The second specimen was the true anterior margin and this contained the clip which could be visualized with the naked eye.  This was put in a second container labeled additional anterior margin.  Hemostasis was achieved.  The Faxitron image of the clip showed the clip to be in the second specimen is as described above.  The wound was irrigated.  Hemostasis achieved.  Clips were placed in the cavity and this was closed with 3-0 Vicryl and 4-0 Monocryl.  Neoprobe settings changed technetium.  Neoprobe used and hotspot identified in the right axilla.  A  transverse incision was made through the inferior border of the axillary hairline.  Dissection was carried down into the level 1 contents.  Neoprobe used to identify a blue hot sentinel node.  This was excised.  Background counts approached 0.  Upon inspection of the axilla there were no additional nodes noted with the neoprobe nor under direct inspection given the use of methylene  blue dye.  The cavity was irrigated.  He was hemostatic.  Was then closed with 3-0 Vicryl for Monocryl.  Dermabond applied to both incisions.  All counts found to be correct.  Patient was awoke extubated taken to recovery in satisfactory condition.

## 2020-01-31 NOTE — Transfer of Care (Signed)
Immediate Anesthesia Transfer of Care Note  Patient: Theresa Bradley  Procedure(s) Performed: RIGHT BREAST LUMPECTOMY WITH RADIOACTIVE SEED AND SENTINEL LYMPH NODE BIOPSY (Right Breast)  Patient Location: PACU  Anesthesia Type:GA combined with regional for post-op pain  Level of Consciousness: alert  and patient cooperative  Airway & Oxygen Therapy: Patient Spontanous Breathing and Patient connected to face mask oxygen  Post-op Assessment: Report given to RN and Post -op Vital signs reviewed and stable  Post vital signs: Reviewed and stable  Last Vitals:  Vitals Value Taken Time  BP    Temp    Pulse 94 01/31/20 1210  Resp 20 01/31/20 1210  SpO2 98 % 01/31/20 1210    Last Pain:  Vitals:   01/31/20 1020  TempSrc: Oral  PainSc: 0-No pain         Complications: No apparent anesthesia complications

## 2020-02-01 ENCOUNTER — Encounter: Payer: Self-pay | Admitting: *Deleted

## 2020-02-01 NOTE — Anesthesia Postprocedure Evaluation (Signed)
Anesthesia Post Note  Patient: Theresa Bradley  Procedure(s) Performed: RIGHT BREAST LUMPECTOMY WITH RADIOACTIVE SEED AND SENTINEL LYMPH NODE BIOPSY (Right Breast)     Patient location during evaluation: PACU Anesthesia Type: General Level of consciousness: awake and alert and oriented Pain management: pain level controlled Vital Signs Assessment: post-procedure vital signs reviewed and stable Respiratory status: spontaneous breathing, nonlabored ventilation and respiratory function stable Cardiovascular status: blood pressure returned to baseline Postop Assessment: no apparent nausea or vomiting Anesthetic complications: no             Brennan Bailey

## 2020-02-03 ENCOUNTER — Ambulatory Visit: Payer: Self-pay | Admitting: Genetic Counselor

## 2020-02-03 ENCOUNTER — Telehealth: Payer: Self-pay | Admitting: Genetic Counselor

## 2020-02-03 DIAGNOSIS — Z1379 Encounter for other screening for genetic and chromosomal anomalies: Secondary | ICD-10-CM

## 2020-02-03 NOTE — Telephone Encounter (Signed)
Revealed negative genetic testing.  Discussed that we do not know why she has breast cancer or why there is cancer in the family.  It could be due to a different gene that we are not testing, or our current technology may not be able detect something.  It will be important for her to keep in contact with genetics to keep up with whether additional testing may be appropriate in the future.

## 2020-02-04 ENCOUNTER — Encounter: Payer: Self-pay | Admitting: Genetic Counselor

## 2020-02-04 NOTE — Progress Notes (Signed)
HPI:  Ms. Jeanbaptiste was previously seen in the [facility] due to a personal and family history of cancer and concerns regarding a hereditary predisposition to cancer. Please refer to our prior cancer genetics clinic note for more information regarding our discussion, assessment and recommendations, at the time. Ms. Curet recent genetic test results were disclosed to her, as were recommendations warranted by these results. These results and recommendations are discussed in more detail below.  CANCER HISTORY:  Oncology History  Malignant neoplasm of upper-outer quadrant of right breast in female, estrogen receptor positive ([place])  01/04/2020 Initial Diagnosis   Malignant neoplasm of upper-outer quadrant of right breast in female, estrogen receptor positive ([place])   02/01/2020 Genetic Testing   Negative genetic testing:  No pathogenic variants detected on the [facility] Breast Cancer STAT panel. The report date is 02/01/2020.  The Breast Cancer STAT panel offered by [facility] includes sequencing and rearrangement analysis for the following 9 genes:  ATM, BRCA1, BRCA2, CDH1, CHEK2, PALB2, PTEN, STK11 and TP53.    02/15/2020 -  Chemotherapy   The patient had trastuzumab-dkst (OGIVRI) 567 mg in sodium chloride 0.9 % 250 mL chemo infusion, 8 mg/kg = 567 mg, Intravenous,  Once, 0 of 9 cycles  for chemotherapy treatment.      FAMILY HISTORY:  We obtained a detailed, 4-generation family history.  Significant diagnoses are listed below: Family History  Problem Relation Age of Onset  . Arthritis Other   . Hypertension Other   . Cancer Other        2 aunts  . Hyperlipidemia Father   . Dementia Father   . Skin cancer Father   . Skin cancer Mother   . Breast cancer Maternal Aunt 63  . Breast cancer Maternal Aunt        dx. mid-70s  . Lung cancer Paternal Uncle   . Multiple myeloma Paternal Uncle   . Cancer Paternal Aunt        unknown type, dx. >50  . Brain cancer Cousin    dx. 71s, paternal cousin   Ms. Prowell has two sons, ages 47 and 25. She has one brother who is 33. None of these family members have had cancer.  Ms. Viera mother is currently 6 and has had skin cancer. She notes that her mother had a lot of sun exposure in the past. Ms. Akkerman had four maternal aunts and one maternal uncle. Two of her maternal aunts had breast cancer, one at age 73 and one in her mid-63s. Her maternal grandmother died at age 50 and her grandfather died in his 49s. There are no other known diagnoses of cancer on the maternal side of the family.  Ms. Totino father is currently 68 and has had skin cancer. She notes that her father also had a lot of sun exposure in the past. Ms. Dowd had three paternal aunts and two paternal uncles. One paternal aunt had cancer diagnosed when she was older than 11, although she is not sure what type of cancer this was. This aunt had a daughter who died from brain cancer in her 50s. One of Ms. Obremski's paternal uncles has a history of lung cancer and multiple myeloma. Both of her paternal grandparents died in their 62s. There are no other known diagnoses of cancer on the paternal side of the family.  Ms. Game is unaware of previous family history of genetic testing for hereditary cancer risks. She does not know her ancestry. There is  no reported Ashkenazi Jewish ancestry. There is no known consanguinity.  GENETIC TEST RESULTS: Genetic testing reported out on 02/01/2020 through the [facility] Breast Cancer STAT panel. No pathogenic variants were detected.   The Breast Cancer STAT panel offered by [facility] includes sequencing and rearrangement analysis for the following 9 genes:  ATM, BRCA1, BRCA2, CDH1, CHEK2, PALB2, PTEN, STK11 and TP53. The test report will be scanned into EP[employer] and located under the Molecular Pathology section of the Results Review tab.  A portion of the result report is included below for reference.     We discussed with Ms.  Messman that because current genetic testing is not perfect, it is possible there may be a gene mutation in one of these genes that current testing cannot detect, but that chance is small.  We also discussed, that there could be another gene that has not yet been discovered, or that we have not yet tested, that is responsible for the cancer diagnoses in the family. It is also possible there is a hereditary cause for the cancer in the family that Ms. Perras did not inherit and therefore was not identified in her testing.  Therefore, it is important to remain in touch with cancer genetics in the future so that we can continue to offer Ms. Andaya the most up to date genetic testing.   ADDITIONAL GENETIC TESTING: We discussed with Ms. Stooksbury that there are other genes that are associated with increased cancer risk that can be analyzed. Should Ms. Arras wish to pursue additional genetic testing, we are happy to discuss and coordinate this testing, at any time.    CANCER SCREENING RECOMMENDATIONS: Ms. Trower test result is considered negative (normal).  This means that we have not identified a hereditary cause for her personal and family history of cancer at this time. Most cancers happen by chance and this negative test suggests that her personal and family of cancer may fall into this category.    While reassuring, this does not definitively rule out a hereditary predisposition to cancer. It is still possible that there could be genetic mutations that are undetectable by current technology. There could be genetic mutations in genes that have not been tested or identified to increase cancer risk.  Therefore, it is recommended she continue to follow the cancer management and screening guidelines provided by her oncology and primary healthcare providers.   An individual's cancer risk and medical management are not determined by genetic test results alone. Overall cancer risk assessment incorporates additional  factors, including personal medical history, family history, and any available genetic information that may result in a personalized plan for cancer prevention and surveillance.  RECOMMENDATIONS FOR FAMILY MEMBERS:  Individuals in this family might be at some increased risk of developing cancer, over the general population risk, simply due to the family history of cancer.  We recommended women in this family have a yearly mammogram beginning at age 50, or 65 years younger than the earliest onset of cancer, an annual clinical breast exam, and perform monthly breast self-exams. Women in this family should also have a gynecological exam as recommended by their primary provider. All family members should have a colonoscopy by age 42.  FOLLOW-UP: Lastly, we discussed with Ms. Wann that cancer genetics is a rapidly advancing field and it is possible that new genetic tests will be appropriate for her and/or her family members in the future. We encouraged her to remain in contact with cancer genetics on an annual basis so  we can update her personal and family histories and let her know of advances in cancer genetics that may benefit this family.   Our contact number was provided. Ms. Torok questions were answered to her satisfaction, and she knows she is welcome to call us at anytime with additional questions or concerns.   Clint Guy, MS, [facility] Genetic Counselor [email or web address] Phone: [phone]
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
## 2020-02-06 ENCOUNTER — Inpatient Hospital Stay: Payer: Medicare Other | Admitting: Oncology

## 2020-02-06 ENCOUNTER — Encounter: Payer: Self-pay | Admitting: Oncology

## 2020-02-06 ENCOUNTER — Other Ambulatory Visit: Payer: Self-pay

## 2020-02-06 ENCOUNTER — Encounter: Payer: Self-pay | Admitting: *Deleted

## 2020-02-06 VITALS — BP 141/70 | HR 57 | Temp 98.7°F | Resp 18 | Ht 64.0 in | Wt 156.1 lb

## 2020-02-06 DIAGNOSIS — Z170 Estrogen receptor positive status [ER+]: Secondary | ICD-10-CM | POA: Diagnosis not present

## 2020-02-06 DIAGNOSIS — C50111 Malignant neoplasm of central portion of right female breast: Secondary | ICD-10-CM | POA: Diagnosis not present

## 2020-02-06 DIAGNOSIS — I10 Essential (primary) hypertension: Secondary | ICD-10-CM

## 2020-02-06 DIAGNOSIS — C50411 Malignant neoplasm of upper-outer quadrant of right female breast: Secondary | ICD-10-CM | POA: Diagnosis not present

## 2020-02-06 LAB — SURGICAL PATHOLOGY

## 2020-02-06 NOTE — Progress Notes (Signed)
Met with patient at registration to introduce myself as Financial Resource Specialist and to offer available resources.  Discussed one-time $1000 [employer] grant and qualifications to assist with personal expenses while going through treatment.  Gave her my card if interested in applying and for any additional financial questions or concerns.  

## 2020-02-07 ENCOUNTER — Encounter: Payer: Self-pay | Admitting: *Deleted

## 2020-02-08 ENCOUNTER — Ambulatory Visit: Payer: Medicare Other | Admitting: Oncology

## 2020-02-08 DIAGNOSIS — J3089 Other allergic rhinitis: Secondary | ICD-10-CM | POA: Diagnosis not present

## 2020-02-08 DIAGNOSIS — J3081 Allergic rhinitis due to animal (cat) (dog) hair and dander: Secondary | ICD-10-CM | POA: Diagnosis not present

## 2020-02-08 DIAGNOSIS — J301 Allergic rhinitis due to pollen: Secondary | ICD-10-CM | POA: Diagnosis not present

## 2020-02-09 ENCOUNTER — Telehealth: Payer: Self-pay | Admitting: Oncology

## 2020-02-09 NOTE — Telephone Encounter (Signed)
Scheduled appts per 3/15 los. Pt confirmed new appt date and times.

## 2020-02-14 ENCOUNTER — Encounter: Payer: Self-pay | Admitting: *Deleted

## 2020-02-20 ENCOUNTER — Ambulatory Visit: Payer: Self-pay | Admitting: Surgery

## 2020-02-20 NOTE — H&P (Signed)
Theresa Bradley Documented: 02/20/2020 2:47 PM Location: [facility] Patient #: [id number] DOB: 1951-06-27 Married / Language: English / Race: White Female  History of Present Illness Marcello Moores A. Cornett MD; 02/20/2020 4:26 PM) Patient words: Patient returns after right breast lumpectomy with sentinel lymph node mapping. Her anterior margin was probably positive which corresponds to the subareolar space. This was also excised again at the same time still positive. She presents today for discussion of options. Of note she had a previous lumpectomy with radiation therapy to the right side in the past. She will also require postoperative chemotherapy after reviewing oncology's note.    FINAL MICROSCOPIC DIAGNOSIS: A. 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 B. BREAST, RIGHT ADDITIONAL ANTERIOR MARGIN, EXCISION: - Focus of invasive ductal carcinoma, < 0.10 cm, which is < 1 mm from the new anterior margin. - Stromal calcification (0.6 cm). C. BREAST, RIGHT ADDITIONAL ANTERIOR MARGIN #2, EXCISION: - Invasive ductal carcinoma, 1.1 cm, Nottingham grade 2 of 3. - Invasive carcinoma broadly involves the new anterior margin. - See oncology table. D. SENTINEL LYMPH NODE, RIGHT AXILLARY, BIOPSY: - One lymph node, negative for carcinoma (0/1). ONCOLOGY TABLE: INVASIVE CARCINOMA OF THE BREAST: Resection Procedure: Lumpectomy with additional anterior margins X2 Specimen Laterality: Right Tumor Size: 1.1 cm Histologic Type: Invasive ductal carcinoma Histologic Grade: Glandular (Acinar)/Tubular Differentiation: 3 Nuclear Pleomorphism: 2 Mitotic Rate: 1 Overall Grade: 2 Ductal Carcinoma In Situ: Not identified Tumor Extension: Limited to breast parenchyma Margins: Invasive carcinoma broadly involves the new anterior margin #2 Regional Lymph Nodes: Number of Lymph Nodes Examined: 1  Number of Sentinel Nodes Examined: 1 Number of Lymph Nodes with Macrometastases (>2 mm): 0 Number of Lymph Nodes with Micrometastases: 0 Number of Lymph Nodes with Isolated Tumor Cells (=0.2 mm or =200 cells): 0 Size of Largest Metastatic Deposit: Not applicable Extranodal Extension: Not applicable Treatment Effect: No known presurgical therapy Breast Biomarker Testing Performed on Previous Biopsy: Yes Testing Performed on Case Number: [id number] Estrogen Receptor: 95%, positive, strong Progesterone Receptor: 80%, positive, strong HER2: Positive (3+) Ki-67: 20% Representative Tumor Block: C1 Pathologic Stage Classification (pTNM, AJCC 8th Edition): pT1c, pN0 Comment: (A) P63, Calponin and SMM-1 demonstrates the absence of myoepithelium in invasive carcinoma (CKAE1AE3 +). CK5/6 is positive in a separate focus. (B) P63, Calponin and SMM-1 demonstrates the absence of myoepithelium in invasive carcinoma. (C) P63, Calponin and SMM-1 demonstrates the absence of myoepithelium in invasive carcinoma (CKAE1AE3 +). Intradepartmental consultation (Dr. Tresa Moore, select slides). (v4.4.0.0) GROSS DESCRIPTION: A. Specimen type: Right breast lumpectomy, placed in formalin at 12:14 PM. Size: 5 x 3 x 2.7 cm. Orientation: Received inked as follows: Anterior = green, posterior = black, medial = yellow, lateral = orange, superior = red, inferior = blue. Localized area: Specimen received without localizing pins. A radioactive seed is retrieved and stored per protocol. Cut surface: Sectioning reveals a 0.4 x 0.4 x 0.4 cm area of yellow discolored indurated tissue at the anterior margin. A discrete lesion is not identified. A clip is not identified. Margins: The area approximates the anterior margin, is 0.2 cm from the medial margin and is grossly greater than 1 cm from the remaining margins. Prognostic  indicators: Not ordered at gross. Block summary: 1-4 = indurated area from medial to lateral. 5 = superior margin. 6 = inferior margin. 7 = additional medial margin. 8 = lateral margin. B. Received fresh and placed in formalin at 12:15 PM is 4.2 x 3.6 x  2 cm of tan-yellow soft tissue, received with one surface inked green, the presumed new anterior margin. All other margins are inked black. The specimen is sectioned to reveal a 0.6 x 0.5 x 0.5 cm partially calcified yellow nodule at the anterior margin. The specimen is radiographed and a clip is not identified. Per radiography performed by the OR team (available in Epic), a ribbon-shaped biopsy clip was present postoperatively. Submitted entirely in 5 cassettes. C. Received fresh and placed in formalin at 12:16 PM is 2 x 1.6 x 0.6 cm of tan-yellow soft tissue, received with the new anterior margin inked black. All other margins are inked green. Submitted entirely in 2 cassettes. D. Rapid Intraoperative Consult performed: No. Specimen: Right axillary sentinel lymph node. Number and size: 1 lymph node, 2 cm. Cut Surface(s): Tan-yellow, not blue. Block Summary: 1 = 1 lymph node serially sectioned ([place] 02/01/2020). Final Diagnosis performed by Gillie Manners, MD. Electronically signed 02/06/2020 Technical and / or Professional components performed at [facility]. [facility], [place] [address], [place], [place] [address]. Immunohistochemistry Technical component (if applicable) was performed at [facility] [address] [place], [place], [place] [address]. IMMUNOHISTOCHEMISTRY DISCLAIMER (if applicable): Some of these immunohistochemical stains may have been developed and the performance characteristics determine by [facility]. Some may not have been cleared or approved by the U.S. Food and Drug Administration. The FDA has determined that such clearance or approval is not  necessary. This test is used for clinical purposes. It should not be regarded as investigational or for research. This laboratory is certified under the [place] (CLIA-88) as qualified to perform high complexity clinical laboratory testing. The controls stained appropriately. Resulting Agency [facility] <[email or web address]> Specimen Collected: 01/31/20 11:20 Last Resulted: 02/06/20 15:51  Lab Flowsheet <[email or web address]>  Order Details <[email or web address]>  View Encounter <[email or web address]>  Lab and Collection Details <[email or web address]>  Routing <[email or web address]>  Result History <[email or web address]> Linked Documents View Image <[email or web address]>  Result Information  Status Priority Source Edited Result - FINAL (02/06/2020 1551) Timed PATH Breast lump/simple mastectomy Authorizing Provider Information  Name: [name], MD Fax: [phone] Phone: [phone] Pager:.  The patient is a 69 year old female.   Allergies Sabino Gasser, CMA; 02/20/2020 2:47 PM) Codeine Sulfate *ANALGESICS - OPIOID* Nausea, Vomiting. Erythromycin Base *MACROLIDES* Stomach Ache Esomeprazole Magnesium *ULCER DRUGS/ANTISPASMODICS/ANTICHOLINERGICS* GI Upset Allergies Reconciled  Medication History Sabino Gasser, CMA; 02/20/2020 2:47 PM) ALPRAZolam (0.25MG Tablet, Oral) Active. amLODIPine Besylate (5MG Tablet, Oral) Active. Citalopram Hydrobromide (20MG Tablet, Oral) Active. Levothyroxine Sodium (50MCG Tablet, Oral) Active. Meloxicam (15MG Tablet, Oral) Active. Terbinafine HCl (250MG Tablet, Oral) Active. Tretinoin (0.05% Cream, External) Active. Mupirocin (2% Ointment, External) Active. Pepcid AC (10MG Tablet, Oral) Active. EpiPen 2-Pak (0.3MG/0.3ML Soln Auto-inj, Injection) Active. Calcium Carbonate-Vit D-Min (600-200MG-UNIT Tablet, Oral)  Active. Medications Reconciled    Vitals Sabino Gasser CMA; 02/20/2020 2:48 PM) 02/20/2020 2:47 PM Weight: 160.4 lb Height: 64in Body Surface Area: 1.78 m Body Mass Index: 27.53 kg/m  Temp.: 97.61F(Tympanic)  Pulse: 75 (Regular)  BP: 110/72 (Sitting, Left Arm, Standard)        Physical Exam (Mischa Pollard A. Shatavia Santor MD; 02/20/2020 4:26 PM)  Breast Note: Right breast incision is healing well without signs of infection. Right axillary incision healing well.    Assessment & Plan (Velinda Wrobel A. Liliane Mallis MD; 02/20/2020 4:27 PM)  BREAST CANCER, RIGHT (C50.911) Impression: Discuss future options. One is reexcision but this would involve the nipple and she would lose her nipple more likely after  this process. With the history of previous radiation therapy as well as wound healing will be slower. I discussed pros and cons of repeat lumpectomy versus mastectomy. She is not a candidate for any further radiation therapy. She will also require chemotherapy and needs a port. Discussion the pros and cons of mastectomy versus reexcision, she opted for right simple mastectomy with port placement. Discussed treatment options for breast cancer to include breast conservation vs mastectomy with reconstruction. Pt has decided on mastectomy. Risk include bleeding, infection, flap necrosis, pain, numbness, recurrence, hematoma, other surgery needs. Pt understands and agrees to proceed. Pt requires port placement for chemotherapy. Risk include bleeding, infection, pneumothorax, hemothorax, mediastinal injury, nerve injury , blood vessel injury, stroke, blood clots, death, migration. embolization and need for additional procedures. Pt agrees to proceed.  Current Plans Pt Education - CCS Mastectomy HCI Use of a central venous catheter for intravenous therapy was discussed. Technique of catheter placement using ultrasound and fluoroscopy guidance was discussed. Risks such as bleeding, infection,  pneumothorax, catheter occlusion, reoperation, and other risks were discussed. I noted a good likelihood this will help address the problem. Questions were answered. The patient expressed understanding & wishes to proceed.

## 2020-02-20 NOTE — H&P (View-Only) (Signed)
Theresa Bradley Documented: 02/20/2020 2:47 PM Location: [facility] Patient #: [id number] DOB: 02/25/1951 Married / Language: English / Race: White Female  History of Present Illness Marcello Moores A. Avanti Jetter MD; 02/20/2020 4:26 PM) Patient words: Patient returns after right breast lumpectomy with sentinel lymph node mapping. Her anterior margin was probably positive which corresponds to the subareolar space. This was also excised again at the same time still positive. She presents today for discussion of options. Of note she had a previous lumpectomy with radiation therapy to the right side in the past. She will also require postoperative chemotherapy after reviewing oncology's note.    FINAL MICROSCOPIC DIAGNOSIS: A. 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 B. BREAST, RIGHT ADDITIONAL ANTERIOR MARGIN, EXCISION: - Focus of invasive ductal carcinoma, < 0.10 cm, which is < 1 mm from the new anterior margin. - Stromal calcification (0.6 cm). C. BREAST, RIGHT ADDITIONAL ANTERIOR MARGIN #2, EXCISION: - Invasive ductal carcinoma, 1.1 cm, Nottingham grade 2 of 3. - Invasive carcinoma broadly involves the new anterior margin. - See oncology table. D. SENTINEL LYMPH NODE, RIGHT AXILLARY, BIOPSY: - One lymph node, negative for carcinoma (0/1). ONCOLOGY TABLE: INVASIVE CARCINOMA OF THE BREAST: Resection Procedure: Lumpectomy with additional anterior margins X2 Specimen Laterality: Right Tumor Size: 1.1 cm Histologic Type: Invasive ductal carcinoma Histologic Grade: Glandular (Acinar)/Tubular Differentiation: 3 Nuclear Pleomorphism: 2 Mitotic Rate: 1 Overall Grade: 2 Ductal Carcinoma In Situ: Not identified Tumor Extension: Limited to breast parenchyma Margins: Invasive carcinoma broadly involves the new anterior margin #2 Regional Lymph Nodes: Number of Lymph Nodes Examined: 1  Number of Sentinel Nodes Examined: 1 Number of Lymph Nodes with Macrometastases (>2 mm): 0 Number of Lymph Nodes with Micrometastases: 0 Number of Lymph Nodes with Isolated Tumor Cells (=0.2 mm or =200 cells): 0 Size of Largest Metastatic Deposit: Not applicable Extranodal Extension: Not applicable Treatment Effect: No known presurgical therapy Breast Biomarker Testing Performed on Previous Biopsy: Yes Testing Performed on Case Number: [id number] Estrogen Receptor: 95%, positive, strong Progesterone Receptor: 80%, positive, strong HER2: Positive (3+) Ki-67: 20% Representative Tumor Block: C1 Pathologic Stage Classification (pTNM, AJCC 8th Edition): pT1c, pN0 Comment: (A) P63, Calponin and SMM-1 demonstrates the absence of myoepithelium in invasive carcinoma (CKAE1AE3 +). CK5/6 is positive in a separate focus. (B) P63, Calponin and SMM-1 demonstrates the absence of myoepithelium in invasive carcinoma. (C) P63, Calponin and SMM-1 demonstrates the absence of myoepithelium in invasive carcinoma (CKAE1AE3 +). Intradepartmental consultation (Dr. Tresa Moore, select slides). (v4.4.0.0) GROSS DESCRIPTION: A. Specimen type: Right breast lumpectomy, placed in formalin at 12:14 PM. Size: 5 x 3 x 2.7 cm. Orientation: Received inked as follows: Anterior = green, posterior = black, medial = yellow, lateral = orange, superior = red, inferior = blue. Localized area: Specimen received without localizing pins. A radioactive seed is retrieved and stored per protocol. Cut surface: Sectioning reveals a 0.4 x 0.4 x 0.4 cm area of yellow discolored indurated tissue at the anterior margin. A discrete lesion is not identified. A clip is not identified. Margins: The area approximates the anterior margin, is 0.2 cm from the medial margin and is grossly greater than 1 cm from the remaining margins. Prognostic  indicators: Not ordered at gross. Block summary: 1-4 = indurated area from medial to lateral. 5 = superior margin. 6 = inferior margin. 7 = additional medial margin. 8 = lateral margin. B. Received fresh and placed in formalin at 12:15 PM is 4.2 x 3.6 x  2 cm of tan-yellow soft tissue, received with one surface inked green, the presumed new anterior margin. All other margins are inked black. The specimen is sectioned to reveal a 0.6 x 0.5 x 0.5 cm partially calcified yellow nodule at the anterior margin. The specimen is radiographed and a clip is not identified. Per radiography performed by the OR team (available in Epic), a ribbon-shaped biopsy clip was present postoperatively. Submitted entirely in 5 cassettes. C. Received fresh and placed in formalin at 12:16 PM is 2 x 1.6 x 0.6 cm of tan-yellow soft tissue, received with the new anterior margin inked black. All other margins are inked green. Submitted entirely in 2 cassettes. D. Rapid Intraoperative Consult performed: No. Specimen: Right axillary sentinel lymph node. Number and size: 1 lymph node, 2 cm. Cut Surface(s): Tan-yellow, not blue. Block Summary: 1 = 1 lymph node serially sectioned ([place] 02/01/2020). Final Diagnosis performed by Gillie Manners, MD. Electronically signed 02/06/2020 Technical and / or Professional components performed at [facility]. [facility], [place] [address], [place], [place] [address]. Immunohistochemistry Technical component (if applicable) was performed at [facility] [address] [place], [place], [place] [address]. IMMUNOHISTOCHEMISTRY DISCLAIMER (if applicable): Some of these immunohistochemical stains may have been developed and the performance characteristics determine by [facility]. Some may not have been cleared or approved by the U.S. Food and Drug Administration. The FDA has determined that such clearance or approval is not  necessary. This test is used for clinical purposes. It should not be regarded as investigational or for research. This laboratory is certified under the [place] (CLIA-88) as qualified to perform high complexity clinical laboratory testing. The controls stained appropriately. Resulting Agency [facility] <[email or web address]> Specimen Collected: 01/31/20 11:20 Last Resulted: 02/06/20 15:51  Lab Flowsheet <[email or web address]>  Order Details <[email or web address]>  View Encounter <[email or web address]>  Lab and Collection Details <[email or web address]>  Routing <[email or web address]>  Result History <[email or web address]> Linked Documents View Image <[email or web address]>  Result Information  Status Priority Source Edited Result - FINAL (02/06/2020 1551) Timed PATH Breast lump/simple mastectomy Authorizing Provider Information  Name: [name], MD Fax: [phone] Phone: [phone] Pager:.  The patient is a 69 year old female.   Allergies Sabino Gasser, CMA; 02/20/2020 2:47 PM) Codeine Sulfate *ANALGESICS - OPIOID* Nausea, Vomiting. Erythromycin Base *MACROLIDES* Stomach Ache Esomeprazole Magnesium *ULCER DRUGS/ANTISPASMODICS/ANTICHOLINERGICS* GI Upset Allergies Reconciled  Medication History Sabino Gasser, CMA; 02/20/2020 2:47 PM) ALPRAZolam (0.25MG Tablet, Oral) Active. amLODIPine Besylate (5MG Tablet, Oral) Active. Citalopram Hydrobromide (20MG Tablet, Oral) Active. Levothyroxine Sodium (50MCG Tablet, Oral) Active. Meloxicam (15MG Tablet, Oral) Active. Terbinafine HCl (250MG Tablet, Oral) Active. Tretinoin (0.05% Cream, External) Active. Mupirocin (2% Ointment, External) Active. Pepcid AC (10MG Tablet, Oral) Active. EpiPen 2-Pak (0.3MG/0.3ML Soln Auto-inj, Injection) Active. Calcium Carbonate-Vit D-Min (600-200MG-UNIT Tablet, Oral)  Active. Medications Reconciled    Vitals Sabino Gasser CMA; 02/20/2020 2:48 PM) 02/20/2020 2:47 PM Weight: 160.4 lb Height: 64in Body Surface Area: 1.78 m Body Mass Index: 27.53 kg/m  Temp.: 97.61F(Tympanic)  Pulse: 75 (Regular)  BP: 110/72 (Sitting, Left Arm, Standard)        Physical Exam (Ernesto Lashway A. Jeyren Danowski MD; 02/20/2020 4:26 PM)  Breast Note: Right breast incision is healing well without signs of infection. Right axillary incision healing well.    Assessment & Plan (Lavance Beazer A. Ariela Mochizuki MD; 02/20/2020 4:27 PM)  BREAST CANCER, RIGHT (C50.911) Impression: Discuss future options. One is reexcision but this would involve the nipple and she would lose her nipple more likely after  this process. With the history of previous radiation therapy as well as wound healing will be slower. I discussed pros and cons of repeat lumpectomy versus mastectomy. She is not a candidate for any further radiation therapy. She will also require chemotherapy and needs a port. Discussion the pros and cons of mastectomy versus reexcision, she opted for right simple mastectomy with port placement. Discussed treatment options for breast cancer to include breast conservation vs mastectomy with reconstruction. Pt has decided on mastectomy. Risk include bleeding, infection, flap necrosis, pain, numbness, recurrence, hematoma, other surgery needs. Pt understands and agrees to proceed. Pt requires port placement for chemotherapy. Risk include bleeding, infection, pneumothorax, hemothorax, mediastinal injury, nerve injury , blood vessel injury, stroke, blood clots, death, migration. embolization and need for additional procedures. Pt agrees to proceed.  Current Plans Pt Education - CCS Mastectomy HCI Use of a central venous catheter for intravenous therapy was discussed. Technique of catheter placement using ultrasound and fluoroscopy guidance was discussed. Risks such as bleeding, infection,  pneumothorax, catheter occlusion, reoperation, and other risks were discussed. I noted a good likelihood this will help address the problem. Questions were answered. The patient expressed understanding & wishes to proceed.

## 2020-02-22 ENCOUNTER — Encounter: Payer: Self-pay | Admitting: *Deleted

## 2020-02-22 ENCOUNTER — Telehealth: Payer: Self-pay | Admitting: Oncology

## 2020-02-22 DIAGNOSIS — J3089 Other allergic rhinitis: Secondary | ICD-10-CM | POA: Diagnosis not present

## 2020-02-22 DIAGNOSIS — J3081 Allergic rhinitis due to animal (cat) (dog) hair and dander: Secondary | ICD-10-CM | POA: Diagnosis not present

## 2020-02-22 DIAGNOSIS — J301 Allergic rhinitis due to pollen: Secondary | ICD-10-CM | POA: Diagnosis not present

## 2020-02-22 NOTE — Telephone Encounter (Signed)
R/s appt per 3/31 sch msg - pt is aware of appt change

## 2020-02-23 ENCOUNTER — Other Ambulatory Visit: Payer: Self-pay | Admitting: Family Medicine

## 2020-02-27 ENCOUNTER — Ambulatory Visit: Payer: Medicare Other | Admitting: Physical Therapy

## 2020-02-27 ENCOUNTER — Encounter: Payer: Self-pay | Admitting: *Deleted

## 2020-03-05 ENCOUNTER — Other Ambulatory Visit: Payer: Self-pay | Admitting: Family Medicine

## 2020-03-06 ENCOUNTER — Other Ambulatory Visit: Payer: Self-pay

## 2020-03-06 ENCOUNTER — Encounter (HOSPITAL_BASED_OUTPATIENT_CLINIC_OR_DEPARTMENT_OTHER): Payer: Self-pay | Admitting: Surgery

## 2020-03-06 DIAGNOSIS — C50911 Malignant neoplasm of unspecified site of right female breast: Secondary | ICD-10-CM | POA: Diagnosis not present

## 2020-03-06 NOTE — Telephone Encounter (Signed)
Last ov:12/16/19 Last filled:12/07/19

## 2020-03-07 DIAGNOSIS — J3081 Allergic rhinitis due to animal (cat) (dog) hair and dander: Secondary | ICD-10-CM | POA: Diagnosis not present

## 2020-03-07 DIAGNOSIS — J3089 Other allergic rhinitis: Secondary | ICD-10-CM | POA: Diagnosis not present

## 2020-03-07 DIAGNOSIS — J301 Allergic rhinitis due to pollen: Secondary | ICD-10-CM | POA: Diagnosis not present

## 2020-03-09 DIAGNOSIS — J301 Allergic rhinitis due to pollen: Secondary | ICD-10-CM | POA: Diagnosis not present

## 2020-03-09 DIAGNOSIS — J3089 Other allergic rhinitis: Secondary | ICD-10-CM | POA: Diagnosis not present

## 2020-03-09 DIAGNOSIS — J3081 Allergic rhinitis due to animal (cat) (dog) hair and dander: Secondary | ICD-10-CM | POA: Diagnosis not present

## 2020-03-12 ENCOUNTER — Other Ambulatory Visit (HOSPITAL_COMMUNITY)
Admission: RE | Admit: 2020-03-12 | Discharge: 2020-03-12 | Disposition: A | Discharge status: Home / Self Care | Payer: Medicare Other | Source: Ambulatory Visit | Attending: Surgery | Admitting: Surgery

## 2020-03-12 ENCOUNTER — Encounter (HOSPITAL_BASED_OUTPATIENT_CLINIC_OR_DEPARTMENT_OTHER)
Admission: RE | Admit: 2020-03-12 | Discharge: 2020-03-12 | Disposition: A | Discharge status: Home / Self Care | Payer: Medicare Other | Source: Ambulatory Visit | Attending: Surgery | Admitting: Surgery

## 2020-03-12 DIAGNOSIS — C50911 Malignant neoplasm of unspecified site of right female breast: Secondary | ICD-10-CM | POA: Insufficient documentation

## 2020-03-12 DIAGNOSIS — K219 Gastro-esophageal reflux disease without esophagitis: Secondary | ICD-10-CM | POA: Insufficient documentation

## 2020-03-12 DIAGNOSIS — Z79899 Other long term (current) drug therapy: Secondary | ICD-10-CM | POA: Insufficient documentation

## 2020-03-12 DIAGNOSIS — Z01812 Encounter for preprocedural laboratory examination: Secondary | ICD-10-CM | POA: Insufficient documentation

## 2020-03-12 DIAGNOSIS — I10 Essential (primary) hypertension: Secondary | ICD-10-CM | POA: Insufficient documentation

## 2020-03-12 DIAGNOSIS — E039 Hypothyroidism, unspecified: Secondary | ICD-10-CM | POA: Insufficient documentation

## 2020-03-12 DIAGNOSIS — Z79890 Hormone replacement therapy (postmenopausal): Secondary | ICD-10-CM | POA: Insufficient documentation

## 2020-03-12 DIAGNOSIS — Z20822 Contact with and (suspected) exposure to covid-19: Secondary | ICD-10-CM | POA: Diagnosis not present

## 2020-03-12 DIAGNOSIS — J3081 Allergic rhinitis due to animal (cat) (dog) hair and dander: Secondary | ICD-10-CM | POA: Diagnosis not present

## 2020-03-12 DIAGNOSIS — J301 Allergic rhinitis due to pollen: Secondary | ICD-10-CM | POA: Diagnosis not present

## 2020-03-12 DIAGNOSIS — J3089 Other allergic rhinitis: Secondary | ICD-10-CM | POA: Diagnosis not present

## 2020-03-12 LAB — CBC WITH DIFFERENTIAL/PLATELET
Abs Immature Granulocytes: 0.01 10*3/uL (ref 0.00–0.07)
Basophils Absolute: 0.1 10*3/uL (ref 0.0–0.1)
Basophils Relative: 1 %
Eosinophils Absolute: 0.2 10*3/uL (ref 0.0–0.5)
Eosinophils Relative: 3 %
HCT: 42.1 % (ref 36.0–46.0)
Hemoglobin: 13.6 g/dL (ref 12.0–15.0)
Immature Granulocytes: 0 %
Lymphocytes Relative: 35 %
Lymphs Abs: 2 10*3/uL (ref 0.7–4.0)
MCH: 28.9 pg (ref 26.0–34.0)
MCHC: 32.3 g/dL (ref 30.0–36.0)
MCV: 89.4 fL (ref 80.0–100.0)
Monocytes Absolute: 0.6 10*3/uL (ref 0.1–1.0)
Monocytes Relative: 11 %
Neutro Abs: 2.8 10*3/uL (ref 1.7–7.7)
Neutrophils Relative %: 50 %
Platelets: 319 10*3/uL (ref 150–400)
RBC: 4.71 MIL/uL (ref 3.87–5.11)
RDW: 12.5 % (ref 11.5–15.5)
WBC: 5.6 10*3/uL (ref 4.0–10.5)
nRBC: 0 % (ref 0.0–0.2)

## 2020-03-12 LAB — COMPREHENSIVE METABOLIC PANEL
ALT: 12 U/L (ref 0–44)
AST: 15 U/L (ref 15–41)
Albumin: 3.7 g/dL (ref 3.5–5.0)
Alkaline Phosphatase: 63 U/L (ref 38–126)
Anion gap: 9 (ref 5–15)
BUN: 12 mg/dL (ref 8–23)
CO2: 28 mmol/L (ref 22–32)
Calcium: 8.9 mg/dL (ref 8.9–10.3)
Chloride: 101 mmol/L (ref 98–111)
Creatinine, Ser: 0.74 mg/dL (ref 0.44–1.00)
GFR calc Af Amer: >60 mL/min (ref >60)
GFR calc non Af Amer: >60 mL/min (ref >60)
Glucose, Bld: 109 mg/dL — ABNORMAL HIGH (ref 70–99)
Potassium: 4 mmol/L (ref 3.5–5.1)
Sodium: 138 mmol/L (ref 135–145)
Total Bilirubin: 0.4 mg/dL (ref 0.3–1.2)
Total Protein: 7 g/dL (ref 6.5–8.1)

## 2020-03-12 LAB — SARS CORONAVIRUS 2 (TAT 6-24 HRS): SARS Coronavirus 2: NEGATIVE

## 2020-03-12 NOTE — Progress Notes (Signed)

## 2020-03-13 ENCOUNTER — Other Ambulatory Visit: Payer: Medicare Other

## 2020-03-13 ENCOUNTER — Inpatient Hospital Stay: Payer: Medicare Other | Admitting: Oncology

## 2020-03-15 ENCOUNTER — Ambulatory Visit (HOSPITAL_BASED_OUTPATIENT_CLINIC_OR_DEPARTMENT_OTHER): Payer: Medicare Other | Admitting: Anesthesiology

## 2020-03-15 ENCOUNTER — Encounter (HOSPITAL_BASED_OUTPATIENT_CLINIC_OR_DEPARTMENT_OTHER)
Admission: RE | Disposition: A | Discharge status: Home / Self Care | Payer: Self-pay | Source: Home / Self Care | Attending: Surgery

## 2020-03-15 ENCOUNTER — Ambulatory Visit (HOSPITAL_COMMUNITY): Payer: Medicare Other

## 2020-03-15 ENCOUNTER — Ambulatory Visit (HOSPITAL_BASED_OUTPATIENT_CLINIC_OR_DEPARTMENT_OTHER)
Admission: RE | Admit: 2020-03-15 | Discharge: 2020-03-16 | Disposition: A | Discharge status: Home / Self Care | Payer: Medicare Other | Attending: Surgery | Admitting: Surgery

## 2020-03-15 ENCOUNTER — Encounter (HOSPITAL_BASED_OUTPATIENT_CLINIC_OR_DEPARTMENT_OTHER): Payer: Self-pay | Admitting: Surgery

## 2020-03-15 ENCOUNTER — Other Ambulatory Visit: Payer: Self-pay

## 2020-03-15 DIAGNOSIS — F419 Anxiety disorder, unspecified: Secondary | ICD-10-CM | POA: Insufficient documentation

## 2020-03-15 DIAGNOSIS — Z79899 Other long term (current) drug therapy: Secondary | ICD-10-CM | POA: Diagnosis not present

## 2020-03-15 DIAGNOSIS — Z881 Allergy status to other antibiotic agents status: Secondary | ICD-10-CM | POA: Diagnosis not present

## 2020-03-15 DIAGNOSIS — Z419 Encounter for procedure for purposes other than remedying health state, unspecified: Secondary | ICD-10-CM

## 2020-03-15 DIAGNOSIS — I10 Essential (primary) hypertension: Secondary | ICD-10-CM | POA: Diagnosis not present

## 2020-03-15 DIAGNOSIS — C50411 Malignant neoplasm of upper-outer quadrant of right female breast: Secondary | ICD-10-CM | POA: Diagnosis not present

## 2020-03-15 DIAGNOSIS — Z888 Allergy status to other drugs, medicaments and biological substances status: Secondary | ICD-10-CM | POA: Insufficient documentation

## 2020-03-15 DIAGNOSIS — Z170 Estrogen receptor positive status [ER+]: Secondary | ICD-10-CM | POA: Insufficient documentation

## 2020-03-15 DIAGNOSIS — E039 Hypothyroidism, unspecified: Secondary | ICD-10-CM | POA: Diagnosis not present

## 2020-03-15 DIAGNOSIS — Z885 Allergy status to narcotic agent status: Secondary | ICD-10-CM | POA: Insufficient documentation

## 2020-03-15 DIAGNOSIS — Z95828 Presence of other vascular implants and grafts: Secondary | ICD-10-CM

## 2020-03-15 DIAGNOSIS — M1990 Unspecified osteoarthritis, unspecified site: Secondary | ICD-10-CM | POA: Diagnosis not present

## 2020-03-15 DIAGNOSIS — Z923 Personal history of irradiation: Secondary | ICD-10-CM | POA: Diagnosis not present

## 2020-03-15 DIAGNOSIS — Z452 Encounter for adjustment and management of vascular access device: Secondary | ICD-10-CM | POA: Diagnosis not present

## 2020-03-15 DIAGNOSIS — K219 Gastro-esophageal reflux disease without esophagitis: Secondary | ICD-10-CM | POA: Diagnosis not present

## 2020-03-15 DIAGNOSIS — C50911 Malignant neoplasm of unspecified site of right female breast: Secondary | ICD-10-CM | POA: Diagnosis not present

## 2020-03-15 HISTORY — PX: PORTACATH PLACEMENT: SHX2246

## 2020-03-15 HISTORY — PX: SIMPLE MASTECTOMY WITH AXILLARY SENTINEL NODE BIOPSY: SHX6098

## 2020-03-15 SURGERY — SIMPLE MASTECTOMY
Anesthesia: Regional | Site: Chest | Laterality: Right

## 2020-03-15 MED ORDER — BUPIVACAINE HCL (PF) 0.25 % IJ SOLN
INTRAMUSCULAR | Status: DC | PRN
  Administered 2020-03-15: 10 mL

## 2020-03-15 MED ORDER — PROPOFOL 10 MG/ML IV BOLUS
INTRAVENOUS | Status: AC
  Filled 2020-03-15: qty 20

## 2020-03-15 MED ORDER — HEPARIN SOD (PORK) LOCK FLUSH 100 UNIT/ML IV SOLN
INTRAVENOUS | Status: AC
  Filled 2020-03-15: qty 5

## 2020-03-15 MED ORDER — SODIUM CHLORIDE 0.9 % IV SOLN: 250.0000 mL | INTRAVENOUS | Status: DC | PRN

## 2020-03-15 MED ORDER — ACETAMINOPHEN 500 MG PO TABS: 1000.0000 mg | ORAL_TABLET | Freq: Once | ORAL | Status: DC

## 2020-03-15 MED ORDER — SODIUM CHLORIDE 0.9% FLUSH: 3.0000 mL | INTRAVENOUS | Status: DC | PRN

## 2020-03-15 MED ORDER — ROPIVACAINE HCL 5 MG/ML IJ SOLN
INTRAMUSCULAR | Status: DC | PRN
  Administered 2020-03-15: 30 mL via PERINEURAL

## 2020-03-15 MED ORDER — ACETAMINOPHEN 80 MG RE SUPP: 650.0000 mg | RECTAL | Status: DC | PRN

## 2020-03-15 MED ORDER — CEFAZOLIN SODIUM-DEXTROSE 2-4 GM/100ML-% IV SOLN
INTRAVENOUS | Status: AC
  Filled 2020-03-15: qty 100

## 2020-03-15 MED ORDER — ONDANSETRON HCL 4 MG/2ML IJ SOLN: 4.0000 mg | INTRAMUSCULAR | Status: DC | PRN

## 2020-03-15 MED ORDER — FENTANYL CITRATE (PF) 100 MCG/2ML IJ SOLN
INTRAMUSCULAR | Status: AC
  Filled 2020-03-15: qty 2

## 2020-03-15 MED ORDER — PROPOFOL 10 MG/ML IV BOLUS
INTRAVENOUS | Status: DC | PRN
  Administered 2020-03-15: 150 mg via INTRAVENOUS

## 2020-03-15 MED ORDER — MORPHINE SULFATE (PF) 4 MG/ML IV SOLN: 1.0000 mg | INTRAVENOUS | Status: DC | PRN

## 2020-03-15 MED ORDER — ACETAMINOPHEN 500 MG PO TABS
ORAL_TABLET | ORAL | Status: AC
  Filled 2020-03-15: qty 2

## 2020-03-15 MED ORDER — GABAPENTIN 300 MG PO CAPS
ORAL_CAPSULE | ORAL | Status: AC
  Filled 2020-03-15: qty 1

## 2020-03-15 MED ORDER — HEPARIN SOD (PORK) LOCK FLUSH 100 UNIT/ML IV SOLN
INTRAVENOUS | Status: DC | PRN
  Administered 2020-03-15: 600 [IU] via INTRAVENOUS

## 2020-03-15 MED ORDER — LACTATED RINGERS IV SOLN: INTRAVENOUS | Status: DC

## 2020-03-15 MED ORDER — LIDOCAINE 2% (20 MG/ML) 5 ML SYRINGE
INTRAMUSCULAR | Status: AC
  Filled 2020-03-15: qty 5

## 2020-03-15 MED ORDER — ONDANSETRON HCL 4 MG/2ML IJ SOLN
INTRAMUSCULAR | Status: DC | PRN
  Administered 2020-03-15: 4 mg via INTRAVENOUS

## 2020-03-15 MED ORDER — ALPRAZOLAM 0.25 MG PO TABS
0.2500 mg | ORAL_TABLET | Freq: Every evening | ORAL | Status: DC | PRN
  Administered 2020-03-15: 0.125 mg via ORAL
  Filled 2020-03-15: qty 1

## 2020-03-15 MED ORDER — DEXAMETHASONE SODIUM PHOSPHATE 10 MG/ML IJ SOLN
INTRAMUSCULAR | Status: DC | PRN
  Administered 2020-03-15: 4 mg via INTRAVENOUS

## 2020-03-15 MED ORDER — EPHEDRINE SULFATE-NACL 50-0.9 MG/10ML-% IV SOSY
PREFILLED_SYRINGE | INTRAVENOUS | Status: DC | PRN
  Administered 2020-03-15 (×3): 10 mg via INTRAVENOUS

## 2020-03-15 MED ORDER — FENTANYL CITRATE (PF) 100 MCG/2ML IJ SOLN: 25.0000 ug | INTRAMUSCULAR | Status: DC | PRN

## 2020-03-15 MED ORDER — OXYCODONE HCL 5 MG PO TABS: 5.0000 mg | ORAL_TABLET | Freq: Four times a day (QID) | ORAL | 0 refills | Status: DC | PRN

## 2020-03-15 MED ORDER — GABAPENTIN 300 MG PO CAPS
300.0000 mg | ORAL_CAPSULE | ORAL | Status: AC
  Administered 2020-03-15: 300 mg via ORAL

## 2020-03-15 MED ORDER — CHLORHEXIDINE GLUCONATE CLOTH 2 % EX PADS: 6.0000 | MEDICATED_PAD | Freq: Once | CUTANEOUS | Status: DC

## 2020-03-15 MED ORDER — OXYCODONE HCL 5 MG PO TABS
5.0000 mg | ORAL_TABLET | ORAL | Status: DC | PRN
  Administered 2020-03-15: 5 mg via ORAL
  Filled 2020-03-15: qty 1

## 2020-03-15 MED ORDER — ACETAMINOPHEN 325 MG PO TABS
650.0000 mg | ORAL_TABLET | ORAL | Status: DC | PRN
  Administered 2020-03-15: 23:00:00 650 mg via ORAL
  Filled 2020-03-15: qty 2

## 2020-03-15 MED ORDER — MIDAZOLAM HCL 2 MG/2ML IJ SOLN
INTRAMUSCULAR | Status: AC
  Filled 2020-03-15: qty 2

## 2020-03-15 MED ORDER — FENTANYL CITRATE (PF) 100 MCG/2ML IJ SOLN
50.0000 ug | INTRAMUSCULAR | Status: DC | PRN
  Administered 2020-03-15: 100 ug via INTRAVENOUS

## 2020-03-15 MED ORDER — FENTANYL CITRATE (PF) 100 MCG/2ML IJ SOLN
25.0000 ug | INTRAMUSCULAR | Status: DC | PRN
  Administered 2020-03-15: 50 ug via INTRAVENOUS

## 2020-03-15 MED ORDER — MIDAZOLAM HCL 2 MG/2ML IJ SOLN
1.0000 mg | INTRAMUSCULAR | Status: DC | PRN
  Administered 2020-03-15: 2 mg via INTRAVENOUS

## 2020-03-15 MED ORDER — ONDANSETRON HCL 4 MG/2ML IJ SOLN
INTRAMUSCULAR | Status: AC
  Filled 2020-03-15: qty 2

## 2020-03-15 MED ORDER — DIPHENHYDRAMINE HCL 25 MG PO CAPS: 25.0000 mg | ORAL_CAPSULE | Freq: Four times a day (QID) | ORAL | Status: DC | PRN

## 2020-03-15 MED ORDER — DEXAMETHASONE SODIUM PHOSPHATE 10 MG/ML IJ SOLN
INTRAMUSCULAR | Status: DC | PRN
  Administered 2020-03-15: 5 mg

## 2020-03-15 MED ORDER — ACETAMINOPHEN 500 MG PO TABS
1000.0000 mg | ORAL_TABLET | ORAL | Status: AC
  Administered 2020-03-15: 1000 mg via ORAL

## 2020-03-15 MED ORDER — HYDRALAZINE HCL 25 MG PO TABS
25.0000 mg | ORAL_TABLET | Freq: Four times a day (QID) | ORAL | Status: DC | PRN
  Filled 2020-03-15: qty 1

## 2020-03-15 MED ORDER — DEXTROSE 5 % IV SOLN
3.0000 g | INTRAVENOUS | Status: AC
  Administered 2020-03-15: 2 g via INTRAVENOUS
  Filled 2020-03-15: qty 3000

## 2020-03-15 MED ORDER — HEPARIN (PORCINE) IN NACL 2-0.9 UNITS/ML
INTRAMUSCULAR | Status: AC | PRN
  Administered 2020-03-15: 1 via INTRAVENOUS

## 2020-03-15 MED ORDER — SODIUM CHLORIDE 0.9% FLUSH
3.0000 mL | Freq: Two times a day (BID) | INTRAVENOUS | Status: DC
  Administered 2020-03-15: 3 mL via INTRAVENOUS

## 2020-03-15 MED ORDER — HEPARIN (PORCINE) IN NACL 1000-0.9 UT/500ML-% IV SOLN
INTRAVENOUS | Status: AC
  Filled 2020-03-15: qty 500

## 2020-03-15 SURGICAL SUPPLY — 92 items
ADH SKN CLS APL DERMABOND .7 (GAUZE/BANDAGES/DRESSINGS) ×8
APL PRP STRL LF DISP 70% ISPRP (MISCELLANEOUS) ×2
APL SKNCLS STERI-STRIP NONHPOA (GAUZE/BANDAGES/DRESSINGS)
APPLIER CLIP 11 MED OPEN (CLIP)
APPLIER CLIP 9.375 MED OPEN (MISCELLANEOUS)
APR CLP MED 11 20 MLT OPN (CLIP)
APR CLP MED 9.3 20 MLT OPN (MISCELLANEOUS)
BAG DECANTER FOR FLEXI CONT (MISCELLANEOUS) ×3 IMPLANT
BENZOIN TINCTURE PRP APPL 2/3 (GAUZE/BANDAGES/DRESSINGS) IMPLANT
BINDER BREAST LRG (GAUZE/BANDAGES/DRESSINGS) ×1 IMPLANT
BINDER BREAST MEDIUM (GAUZE/BANDAGES/DRESSINGS) IMPLANT
BINDER BREAST XLRG (GAUZE/BANDAGES/DRESSINGS) IMPLANT
BINDER BREAST XXLRG (GAUZE/BANDAGES/DRESSINGS) IMPLANT
BIOPATCH RED 1 DISK 7.0 (GAUZE/BANDAGES/DRESSINGS) ×1 IMPLANT
BLADE HEX COATED 2.75 (ELECTRODE) ×3 IMPLANT
BLADE SURG 10 STRL SS (BLADE) ×3 IMPLANT
BLADE SURG 11 STRL SS (BLADE) ×3 IMPLANT
BLADE SURG 15 STRL LF DISP TIS (BLADE) ×2 IMPLANT
BLADE SURG 15 STRL SS (BLADE) ×3
BNDG ELASTIC 6X5.8 VLCR STR LF (GAUZE/BANDAGES/DRESSINGS) ×2 IMPLANT
CANISTER SUCT 1200ML W/VALVE (MISCELLANEOUS) ×3 IMPLANT
CHLORAPREP W/TINT 26 (MISCELLANEOUS) ×3 IMPLANT
CLIP APPLIE 11 MED OPEN (CLIP) IMPLANT
CLIP APPLIE 9.375 MED OPEN (MISCELLANEOUS) IMPLANT
COVER BACK TABLE 60X90IN (DRAPES) ×3 IMPLANT
COVER MAYO STAND STRL (DRAPES) ×3 IMPLANT
COVER PROBE 5X48 (MISCELLANEOUS) ×3
COVER PROBE W GEL 5X96 (DRAPES) ×3 IMPLANT
COVER WAND RF STERILE (DRAPES) IMPLANT
DECANTER SPIKE VIAL GLASS SM (MISCELLANEOUS) IMPLANT
DERMABOND ADVANCED (GAUZE/BANDAGES/DRESSINGS) ×4
DERMABOND ADVANCED .7 DNX12 (GAUZE/BANDAGES/DRESSINGS) ×4 IMPLANT
DRAIN CHANNEL 19F RND (DRAIN) ×3 IMPLANT
DRAPE C-ARM 42X72 X-RAY (DRAPES) ×3 IMPLANT
DRAPE LAPAROSCOPIC ABDOMINAL (DRAPES) ×3 IMPLANT
DRAPE UTILITY XL STRL (DRAPES) ×4 IMPLANT
DRSG PAD ABDOMINAL 8X10 ST (GAUZE/BANDAGES/DRESSINGS) IMPLANT
DRSG TEGADERM 2-3/8X2-3/4 SM (GAUZE/BANDAGES/DRESSINGS) IMPLANT
DRSG TEGADERM 4X10 (GAUZE/BANDAGES/DRESSINGS) ×2 IMPLANT
DRSG TEGADERM 4X4.75 (GAUZE/BANDAGES/DRESSINGS) IMPLANT
ELECT REM PT RETURN 9FT ADLT (ELECTROSURGICAL) ×3
ELECTRODE REM PT RTRN 9FT ADLT (ELECTROSURGICAL) ×2 IMPLANT
EVACUATOR SILICONE 100CC (DRAIN) ×3 IMPLANT
GAUZE SPONGE 4X4 12PLY STRL LF (GAUZE/BANDAGES/DRESSINGS) IMPLANT
GLOVE BIOGEL PI IND STRL 7.0 (GLOVE) IMPLANT
GLOVE BIOGEL PI IND STRL 8 (GLOVE) ×2 IMPLANT
GLOVE BIOGEL PI INDICATOR 7.0 (GLOVE) ×2
GLOVE BIOGEL PI INDICATOR 8 (GLOVE) ×1
GLOVE ECLIPSE 8.0 STRL XLNG CF (GLOVE) ×3 IMPLANT
GLOVE SURG SS PI 7.0 STRL IVOR (GLOVE) ×1 IMPLANT
GOWN STRL REUS W/ TWL LRG LVL3 (GOWN DISPOSABLE) ×4 IMPLANT
GOWN STRL REUS W/TWL LRG LVL3 (GOWN DISPOSABLE) ×9
IV KIT MINILOC 20X1 SAFETY (NEEDLE) IMPLANT
KIT CVR 48X5XPRB PLUP LF (MISCELLANEOUS) ×2 IMPLANT
KIT PORT POWER 8FR ISP CVUE (Port) ×1 IMPLANT
NDL HYPO 25X1 1.5 SAFETY (NEEDLE) ×4 IMPLANT
NDL SAFETY ECLIPSE 18X1.5 (NEEDLE) ×2 IMPLANT
NDL SPNL 22GX3.5 QUINCKE BK (NEEDLE) IMPLANT
NEEDLE HYPO 18GX1.5 SHARP (NEEDLE)
NEEDLE HYPO 22GX1.5 SAFETY (NEEDLE) IMPLANT
NEEDLE HYPO 25X1 1.5 SAFETY (NEEDLE) ×3 IMPLANT
NEEDLE SPNL 22GX3.5 QUINCKE BK (NEEDLE) IMPLANT
NS IRRIG 1000ML POUR BTL (IV SOLUTION) ×1 IMPLANT
PENCIL SMOKE EVACUATOR (MISCELLANEOUS) ×3 IMPLANT
PIN SAFETY STERILE (MISCELLANEOUS) ×3 IMPLANT
SET BASIN DAY SURGERY F.S. (CUSTOM PROCEDURE TRAY) ×3 IMPLANT
SET SHEATH INTRODUCER 10FR (MISCELLANEOUS) IMPLANT
SHEATH COOK PEEL AWAY SET 9F (SHEATH) IMPLANT
SLEEVE SCD COMPRESS KNEE MED (MISCELLANEOUS) ×3 IMPLANT
SPONGE LAP 18X18 RF (DISPOSABLE) ×4 IMPLANT
SPONGE LAP 4X18 RFD (DISPOSABLE) IMPLANT
STAPLER VISISTAT 35W (STAPLE) ×2 IMPLANT
STRIP CLOSURE SKIN 1/2X4 (GAUZE/BANDAGES/DRESSINGS) IMPLANT
SUT CHROMIC 3 0 SH 27 (SUTURE) IMPLANT
SUT ETHILON 2 0 FS 18 (SUTURE) ×3 IMPLANT
SUT MNCRL AB 3-0 PS2 18 (SUTURE) ×3 IMPLANT
SUT MON AB 4-0 PC3 18 (SUTURE) ×3 IMPLANT
SUT PROLENE 2 0 CT2 30 (SUTURE) IMPLANT
SUT PROLENE 2 0 SH DA (SUTURE) ×3 IMPLANT
SUT SILK 2 0 PERMA HAND 18 BK (SUTURE) ×2 IMPLANT
SUT SILK 2 0 SH (SUTURE) ×1 IMPLANT
SUT SILK 2 0 TIES 17X18 (SUTURE)
SUT SILK 2-0 18XBRD TIE BLK (SUTURE) IMPLANT
SUT VIC AB 3-0 54X BRD REEL (SUTURE) ×2 IMPLANT
SUT VIC AB 3-0 BRD 54 (SUTURE)
SUT VICRYL 3-0 CR8 SH (SUTURE) ×6 IMPLANT
SYR 5ML LUER SLIP (SYRINGE) ×3 IMPLANT
SYR CONTROL 10ML LL (SYRINGE) ×5 IMPLANT
TOWEL GREEN STERILE FF (TOWEL DISPOSABLE) ×6 IMPLANT
TRAY FAXITRON CT DISP (TRAY / TRAY PROCEDURE) ×2 IMPLANT
TUBE CONNECTING 20X1/4 (TUBING) ×3 IMPLANT
YANKAUER SUCT BULB TIP NO VENT (SUCTIONS) ×3 IMPLANT

## 2020-03-15 NOTE — Anesthesia Preprocedure Evaluation (Addendum)
Anesthesia Evaluation  Patient identified by MRN, date of birth, ID band Patient awake    Reviewed: Allergy & Precautions, NPO status , Patient's Chart, lab work & pertinent test results  Airway Mallampati: I  TM Distance: >3 FB Neck ROM: Full    Dental no notable dental hx. (+) Teeth Intact, Dental Advisory Given   Pulmonary neg pulmonary ROS,    Pulmonary exam normal breath sounds clear to auscultation       Cardiovascular hypertension, Pt. on medications negative cardio ROS Normal cardiovascular exam Rhythm:Regular Rate:Normal  TTE 12/2019 EF 60-65%, G1DD, no significant valvular abnormalities   Neuro/Psych PSYCHIATRIC DISORDERS Anxiety negative neurological ROS     GI/Hepatic Neg liver ROS, GERD  Medicated and Controlled,  Endo/Other  Hypothyroidism   Renal/GU negative Renal ROS  negative genitourinary   Musculoskeletal  (+) Arthritis ,   Abdominal   Peds  Hematology negative hematology ROS (+)   Anesthesia Other Findings Right breast cancer  Reproductive/Obstetrics                            Anesthesia Physical Anesthesia Plan  ASA: II  Anesthesia Plan: General and Regional   Post-op Pain Management:  Regional for Post-op pain   Induction: Intravenous  PONV Risk Score and Plan: 3 and Ondansetron, Dexamethasone, Treatment may vary due to age or medical condition and Midazolam  Airway Management Planned: LMA  Additional Equipment:   Intra-op Plan:   Post-operative Plan: Extubation in OR  Informed Consent: I have reviewed the patients History and Physical, chart, labs and discussed the procedure including the risks, benefits and alternatives for the proposed anesthesia with the patient or authorized representative who has indicated his/her understanding and acceptance.     Dental advisory given  Plan Discussed with: CRNA  Anesthesia Plan Comments:         Anesthesia Quick Evaluation

## 2020-03-15 NOTE — Interval H&P Note (Signed)
History and Physical Interval Note:  03/15/2020 12:32 PM  Theresa Bradley  has presented today for surgery, with the diagnosis of RIGHT BREAST CANCER.  The various methods of treatment have been discussed with the patient and family. After consideration of risks, benefits and other options for treatment, the patient has consented to  Procedure(s): RIGHT SIMPLE MASTECTOMY (Right) INSERTION PORT-A-CATH WITH ULTRASOUND GUIDANCE (N/A) as a surgical intervention.  The patient's history has been reviewed, patient examined, no change in status, stable for surgery.  I have reviewed the patient's chart and labs.  Questions were answered to the patient's satisfaction.     [place]

## 2020-03-15 NOTE — Op Note (Signed)
Preoperative diagnosis:   Right breast cancer/PAC needed  Postoperative diagnosis: Same  Procedure: Portacath Placement with ultrasound and C-arm guidance and right simple mastectomy  Surgeon: Turner Daniels, MD, FACS  Anesthesia: General and 0.25 % marcaine with epinephrine  Clinical History and Indications: The patient is getting ready to begin chemotherapy for her cancer.  She presents for completion right mastectomy due to positive margins with history of previous right breast cancer status post radiation therapy.  Given positive margins and the inability to have any further radiation therapy, she is opted for right simple mastectomy without reconstruction. The surgical and non surgical options have been discussed with the patient.  Risks of surgery include bleeding,  Infection,  Flap necrosis,  Tissue loss,  Chronic pain, death, Numbness,  And the need for additional procedures.  Reconstruction options also have been discussed with the patient as well.  The patient agrees to proceed. She  needs a Port-A-Cath for venous access. Risk of bleeding, infection,  Collapse lung,  Death,  DVT,  Organ injury,  Mediastinal injury,  Injury to heart,  Injury to blood vessels,  Nerves,  Migration of catheter,  Embolization of catheter and the need for more surgery.  Description of Procedure: I have seen the patient in the holding area and confirmed the plans for the procedure as noted above. I reviewed the risks and complications again and the patient has no further questions. She wishes to proceed.   The patient was then taken to the operating room. After satisfactory general  anesthesia had been obtained the upper chest and lower neck were prepped and draped as a sterile field. The timeout was done.  The right internal jugular vein  was entered under U/S guidance  and the guidewire threaded into the superior vena cava right atrial area under fluoroscopic guidance. An incision was then made on the  anterior chest wall and a subcutaneous pocket fashioned for the port reservoir.  The port tubing was then brought through a subcutaneous tunnel from the port site to the guidewire site.  The port and catheter were attached, locked  and flushed. The catheter was measured and cut to appropriate length.The dilator and peel-away sheath were then advanced over the guidewire while monitoring this with fluoroscopy. The guidewire and dilator were removed and the tubing threaded to approximately 22 cm. The peel-away sheath was then removed. The catheter aspirated and flushed easily. Using fluoroscopy the tip was in the superior vena cava right atrial junction area. It aspirated and flushed easily. That aspirated and flushed easily.  The reservoir was secured to the fascia with 1 sutures of 2-0 Prolene. A final check with fluoroscopy was done to make sure we had no kinks and good positioning of the tip of the catheter. Everything appeared to be okay. The catheter was aspirated, flushed with dilute heparin and then concentrated aqueous heparin.  The incision was then closed with interrupted 3-0 Vicryl, and 4-0 Monocryl subcuticular with Dermabond on the skin.      Next, the right simple mastectomy was performed.  Curvilinear incision was made above and below the nipple areolar complex.  Superior skin flap taken up to the clavicle, to the sternum medially, and inferior mammary fold inferiorly.  Once all breast tissue was dissected off the underlying skin, the breast was dissected of the chest wall in a medial to lateral fashion until the lateral attachments were visualized and the breast was divided and removed from the chest wall.  This was passed off  the field.  The wound was irrigated.  It was made hemostatic.  Inspection revealed good hemostasis.  Through the inferior skin flap a 19 round drain was placed through a separate stab incision.  Was secured with 2-0 nylon.  Wound closed then with a deep layer of 3-0  Vicryl and 4-0 Monocryl.  Dermabond applied to all incisions.  All counts were found to be correct.  Bulb placed to suction with good seal.  Breast binder placed.  Patient was awoke extubated taken to recovery in satisfactory condition.  There were no operative complications. Estimated blood loss was minimal. All counts were correct. The patient tolerated the procedure well.  Turner Daniels, MD, FACS

## 2020-03-15 NOTE — Progress Notes (Signed)
Assisted Dr. Lanetta Inch with right, ultrasound guided, pectoralis block. Side rails up, monitors on throughout procedure. See vital signs in flow sheet. Tolerated Procedure well.

## 2020-03-15 NOTE — Anesthesia Procedure Notes (Signed)
Procedure Name: LMA Insertion °Performed by: Vickie Melnik H, CRNA °Pre-anesthesia Checklist: Patient identified, Emergency Drugs available, Suction available and Patient being monitored °Patient Re-evaluated:Patient Re-evaluated prior to induction °Oxygen Delivery Method: Circle System Utilized °Preoxygenation: Pre-oxygenation with 100% oxygen °Induction Type: IV induction °Ventilation: Mask ventilation without difficulty °LMA: LMA inserted °LMA Size: 4.0 °Number of attempts: 1 °Airway Equipment and Method: Bite block °Placement Confirmation: positive ETCO2 °Tube secured with: Tape °Dental Injury: Teeth and Oropharynx as per pre-operative assessment  ° ° ° ° ° ° °

## 2020-03-15 NOTE — Anesthesia Procedure Notes (Addendum)
Anesthesia Regional Block: Pectoralis block   Pre-Anesthetic Checklist: ,, timeout performed, Correct Patient, Correct Site, Correct Laterality, Correct Procedure, Correct Position, site marked, Risks and benefits discussed,  Surgical consent,  Pre-op evaluation,  At surgeon's request and post-op pain management  Laterality: Right  Prep: Maximum Sterile Barrier Precautions used, chloraprep       Needles:  Injection technique: Single-shot  Needle Type: Echogenic Stimulator Needle     Needle Length: 9cm  Needle Gauge: 22     Additional Needles:   Procedures:,,,, ultrasound used (permanent image in chart),,,,  Narrative:  Start time: 03/15/2020 11:48 AM End time: 03/15/2020 11:58 AM Injection made incrementally with aspirations every 5 mL.  Performed by: Personally  Anesthesiologist: Freddrick March, MD  Additional Notes: Monitors applied. No increased pain on injection. No increased resistance to injection. Injection made in 5cc increments. Good needle visualization. Patient tolerated procedure well.

## 2020-03-15 NOTE — Transfer of Care (Signed)
Immediate Anesthesia Transfer of Care Note  Patient: Theresa Bradley  Procedure(s) Performed: RIGHT SIMPLE MASTECTOMY (Right Breast) INSERTION PORT-A-CATH WITH ULTRASOUND GUIDANCE (N/A Chest)  Patient Location: PACU  Anesthesia Type:General  Level of Consciousness: awake  Airway & Oxygen Therapy: Patient Spontanous Breathing  Post-op Assessment: Report given to RN and Post -op Vital signs reviewed and stable  Post vital signs: Reviewed and stable  Last Vitals:  Vitals Value Taken Time  BP    Temp    Pulse    Resp    SpO2      Last Pain:  Vitals:   03/15/20 1209  TempSrc: Oral  PainSc: 0-No pain         Complications: No apparent anesthesia complications

## 2020-03-16 ENCOUNTER — Encounter: Payer: Self-pay | Admitting: *Deleted

## 2020-03-16 NOTE — Discharge Instructions (Signed)
[facility] [place] °[phone] ° °MASTECTOMY: POST OP INSTRUCTIONS ° °Always review your discharge instruction sheet given to you by the facility where your surgery was performed. °IF YOU HAVE DISABILITY OR FAMILY LEAVE FORMS, YOU MUST BRING THEM TO THE OFFICE FOR PROCESSING.   °DO NOT GIVE THEM TO YOUR DOCTOR. °A prescription for pain medication may be given to you upon discharge.  Take your pain medication as prescribed, if needed.  If narcotic pain medicine is not needed, then you may take acetaminophen (Tylenol) or ibuprofen (Advil) as needed. °1. Take your usually prescribed medications unless otherwise directed. °2. If you need a refill on your pain medication, please contact your pharmacy.  They will contact our office to request authorization.  Prescriptions will not be filled after 5pm or on week-ends. °3. You should follow a light diet the first few days after arrival home, such as soup and crackers, etc.  Resume your normal diet the day after surgery. °4. Most patients will experience some swelling and bruising on the chest and underarm.  Ice packs will help.  Swelling and bruising can take several days to resolve.  °5. It is common to experience some constipation if taking pain medication after surgery.  Increasing fluid intake and taking a stool softener (such as Colace) will usually help or prevent this problem from occurring.  A mild laxative (Milk of Magnesia or Miralax) should be taken according to package instructions if there are no bowel movements after 48 hours. °6. Unless discharge instructions indicate otherwise, leave your bandage dry and in place until your next appointment in 3-5 days.  You may take a limited sponge bath.  No tube baths or showers until the drains are removed.  You may have steri-strips (small skin tapes) in place directly over the incision.  These strips should be left on the skin for 7-10 days.  If your surgeon used skin glue on the incision, you may  shower in 24 hours.  The glue will flake off over the next 2-3 weeks.  Any sutures or staples will be removed at the office during your follow-up visit. °7. DRAINS:  If you have drains in place, it is important to keep a list of the amount of drainage produced each day in your drains.  Before leaving the hospital, you should be instructed on drain care.  Call our office if you have any questions about your drains. °8. ACTIVITIES:  You may resume regular (light) daily activities beginning the next day--such as daily self-care, walking, climbing stairs--gradually increasing activities as tolerated.  You may have sexual intercourse when it is comfortable.  Refrain from any heavy lifting or straining until approved by your doctor. °a. You may drive when you are no longer taking prescription pain medication, you can comfortably wear a seatbelt, and you can safely maneuver your car and apply brakes. °b. RETURN TO WORK:  __________________________________________________________ °9. You should see your doctor in the office for a follow-up appointment approximately 3-5 days after your surgery.  Your doctor’s nurse will typically make your follow-up appointment when she calls you with your pathology report.  Expect your pathology report 2-3 business days after your surgery.  You may call to check if you do not hear from us after three days.   °10. OTHER INSTRUCTIONS: ______________________________________________________________________________________________ ____________________________________________________________________________________________ ° °WHEN TO CALL YOUR DOCTOR: °1. Fever over 101.0 °2. Nausea and/or vomiting °3. Extreme swelling or bruising °4. Continued bleeding from incision. °5. Increased pain, redness, or drainage from the   The clinic staff is available to answer your questions during regular business hours.  Please don't hesitate to call and ask to speak to one of the nurses for clinical  concerns.  If you have a medical emergency, go to the nearest emergency room or call 911.  A surgeon from [facility] is always on call at the hospital. [address], [place], [place], [place]  [address] ? [address], [place], [place]   [address] [phone] ? [phone] ? FAX [phone] Web site: [email or web address]       PORT-A-CATH: [place]  Always review your discharge instruction sheet given to you by the facility where your surgery was performed.   1. A prescription for pain medication may be given to you upon discharge. Take your pain medication as prescribed, if needed. If narcotic pain medicine is not needed, then you make take acetaminophen (Tylenol) or ibuprofen (Advil) as needed.  2. Take your usually prescribed medications unless otherwise directed. 3. If you need a refill on your pain medication, please contact our office. All narcotic pain medicine now requires a paper prescription.  Phoned in and fax refills are no longer allowed by law.  Prescriptions will not be filled after 5 pm or on weekends.  4. You should follow a light diet for the remainder of the day after your procedure. 5. Most patients will experience some mild swelling and/or bruising in the area of the incision. It may take several days to resolve. 6. It is common to experience some constipation if taking pain medication after surgery. Increasing fluid intake and taking a stool softener (such as Colace) will usually help or prevent this problem from occurring. A mild laxative (Milk of Magnesia or Miralax) should be taken according to package directions if there are no bowel movements after 48 hours.  7. Unless discharge instructions indicate otherwise, you may remove your bandages 48 hours after surgery, and you may shower at that time. You may have steri-strips (small white skin tapes) in place directly over the incision.  These strips should be left on the skin for 7-10 days.  If  your surgeon used Dermabond (skin glue) on the incision, you may shower in 24 hours.  The glue will flake off over the next 2-3 weeks.  8. If your port is left accessed at the end of surgery (needle left in port), the dressing cannot get wet and should only by changed by a healthcare professional. When the port is no longer accessed (when the needle has been removed), follow step 7.   9. ACTIVITIES:  Limit activity involving your arms for the next 72 hours. Do no strenuous exercise or activity for 1 week. You may drive when you are no longer taking prescription pain medication, you can comfortably wear a seatbelt, and you can maneuver your car. 10.You may need to see your doctor in the office for a follow-up appointment.  Please       check with your doctor.  11.When you receive a new Port-a-Cath, you will get a product guide and        ID card.  Please keep them in case you need them.  WHEN TO CALL YOUR DOCTOR [phone]): 1. Fever over 101.0 2. Chills 3. Continued bleeding from incision 4. Increased redness and tenderness at the site 5. Shortness of breath, difficulty breathing   The clinic staff is available to answer your questions during regular business hours. Please don't hesitate to call and  ask to speak to one of the nurses or medical assistants for clinical concerns. If you have a medical emergency, go to the nearest emergency room or call 911.  A surgeon from [facility] is always on call at the hospital.     For further information, please visit [email or web address]  About my Jackson-Pratt Bulb Drain  What is a Jackson-Pratt bulb? A Jackson-Pratt is a soft, round device used to collect drainage. It is connected to a long, thin drainage catheter, which is held in place by one or two small stiches near your surgical incision site. When the bulb is squeezed, it forms a vacuum, forcing the drainage to empty into the bulb.  Emptying the Jackson-Pratt  bulb- To empty the bulb: 1. Release the plug on the top of the bulb. 2. Pour the bulb's contents into a measuring container which your nurse will provide. 3. Record the time emptied and amount of drainage. Empty the drain(s) as often as your     doctor or nurse recommends.  Date                  Time                    Amount (Drain 1)                 _____________________________________________________________________  _____________________________________________________________________  _____________________________________________________________________  _____________________________________________________________________  _____________________________________________________________________  _____________________________________________________________________  _____________________________________________________________________  _____________________________________________________________________  Squeezing the Jackson-Pratt Bulb- To squeeze the bulb: 1. Make sure the plug at the top of the bulb is open. 2. Squeeze the bulb tightly in your fist. You will hear air squeezing from the bulb. 3. Replace the plug while the bulb is squeezed. 4. Use a safety pin to attach the bulb to your clothing. This will keep the catheter from     pulling at the bulb insertion site.  When to call your doctor- Call your doctor if:  Drain site becomes red, swollen or hot.  You have a fever greater than 101 degrees F.  There is oozing at the drain site.  Drain falls out (apply a guaze bandage over the drain hole and secure it with tape).  Drainage increases daily not related to activity patterns. (You will usually have more drainage when you are active than when you are resting.)  Drainage has a bad odor.

## 2020-03-16 NOTE — Discharge Summary (Signed)
Physician Discharge Summary  Patient ID: [name] MRN: [id number] DOB/AGE: [birth year] 69 y.o.  Admit date: 03/15/2020 Discharge date: 03/16/2020  Admission Diagnoses:right breast cancer  Discharge Diagnoses:  same  Discharged Condition: good  Hospital Course: Pt underwent right simple mastectomy and port placement. She did well.  She had good pain control, tolerated her diet and drain output was within expected range.   Consults: None    Treatments: surgery: right simple mastectomy port placement   Discharge Exam: Blood pressure (!) 119/54, pulse (!) 57, temperature 98.4 F (36.9 C), resp. rate 16, height 5\' 4"  (1.626 m), weight 71.5 kg, SpO2 97 %. General appearance: alert and cooperative Resp: clear to auscultation bilaterally Cardio: regular rate and rhythm, S1, S2 normal, no murmur, click, rub or gallop Incision/Wound:CDI flaps viable  No hematoma  Port site CDI CXR shows it in good position no PTX   Disposition: Discharge disposition: 01-Home or Self Care       Discharge Instructions    Diet - low sodium heart healthy   Complete by: As directed    Increase activity slowly   Complete by: As directed         Signed: Joyice Faster Kizer Nobbe 03/16/2020, 9:28 AM

## 2020-03-19 ENCOUNTER — Encounter: Payer: Self-pay | Admitting: *Deleted

## 2020-03-19 ENCOUNTER — Encounter: Payer: Self-pay | Admitting: Anesthesiology

## 2020-03-19 DIAGNOSIS — J3089 Other allergic rhinitis: Secondary | ICD-10-CM | POA: Diagnosis not present

## 2020-03-19 DIAGNOSIS — J3081 Allergic rhinitis due to animal (cat) (dog) hair and dander: Secondary | ICD-10-CM | POA: Diagnosis not present

## 2020-03-19 DIAGNOSIS — J301 Allergic rhinitis due to pollen: Secondary | ICD-10-CM | POA: Diagnosis not present

## 2020-03-19 NOTE — Anesthesia Postprocedure Evaluation (Signed)
Anesthesia Post Note  Patient: Theresa Bradley  Procedure(s) Performed: RIGHT SIMPLE MASTECTOMY (Right Breast) INSERTION PORT-A-CATH WITH ULTRASOUND GUIDANCE (N/A Chest)     Patient location during evaluation: PACU Anesthesia Type: General and Regional Level of consciousness: awake and alert Pain management: pain level controlled Vital Signs Assessment: post-procedure vital signs reviewed and stable Respiratory status: spontaneous breathing, nonlabored ventilation, respiratory function stable and patient connected to nasal cannula oxygen Cardiovascular status: blood pressure returned to baseline and stable Postop Assessment: no apparent nausea or vomiting Anesthetic complications: no    Last Vitals:  Vitals:   03/16/20 0500 03/16/20 0600  BP:  (!) 119/54  Pulse: 67 (!) 57  Resp:  16  Temp:  36.9 C  SpO2: 97% 97%    Last Pain:  Vitals:   03/16/20 0700  TempSrc:   PainSc: 0-No pain                 Chelsey L Woodrum

## 2020-03-21 ENCOUNTER — Encounter: Payer: Self-pay | Admitting: *Deleted

## 2020-03-26 DIAGNOSIS — J3089 Other allergic rhinitis: Secondary | ICD-10-CM | POA: Diagnosis not present

## 2020-03-26 DIAGNOSIS — J3081 Allergic rhinitis due to animal (cat) (dog) hair and dander: Secondary | ICD-10-CM | POA: Diagnosis not present

## 2020-03-26 DIAGNOSIS — J301 Allergic rhinitis due to pollen: Secondary | ICD-10-CM | POA: Diagnosis not present

## 2020-03-28 LAB — SURGICAL PATHOLOGY

## 2020-03-30 NOTE — Progress Notes (Signed)
Theresa Bradley  Telephone:([phone]) [phone] Fax:([phone]) [phone]     ID: Theresa Bradley DOB: 1951/09/09  MR#: [id number]  NTZ#:[id number]  Patient Care Team: Theresa Post, MD as PCP - General Theresa Bradley, Theresa Grinder, MD as Consulting Physician (Allergy and Immunology) Theresa Bradley, OD as Referring Physician Theresa Germany, RN as Oncology Nurse Navigator Theresa Kaufmann, RN as Oncology Nurse Navigator Theresa Luna, MD as Consulting Physician (General Surgery) Theresa Bradley, Theresa Dad, MD as Consulting Physician (Oncology) Theresa Gibson, MD as Attending Physician (Radiation Oncology) Theresa Levans, MD as Referring Physician (Dermatology) Theresa Cruel, MD OTHER MD:  CHIEF COMPLAINT: triple positive breast cancer (s/p right mastectomy)  CURRENT TREATMENT: weekly paclitaxel x12; trastuzumab   INTERVAL HISTORY: Theresa Bradley" returns today for follow up of her new triple positive breast cancer accompanied by her husband.   Her right lumpectomy pathology results returned later in the day after her last visit on 02/06/2020. Pathology [id number]) revealed: invasive ductal carcinoma, grade 2, 1.1 cm; broadly involving anterior margin. The one biopsied lymph node was benign (0/1).  She discussed her options with Theresa Bradley. Given her previous radiation therapy and lumpectomies to the right breast, the recommendation was for mastectomy. She agreed with this plan and proceeded to right mastectomy on 03/15/2020. Pathology from the procedure ([id number]) revealed: invasive ductal carcinoma, grade 2, 0.9 cm; ductal carcinoma in situ with calcifications, high grade; negative resection margins.   An additional removed lymph node was negative.  A port was placed during the mastectomy procedure.   REVIEW OF SYSTEMS: Theresa Bradley did generally well with her first surgery but had a little bit more discomfort with the second 1.  She is taking a half a pain pill at bedtime and she  is able to sleep through the night with that.  She has soreness at the right incision site, mostly laterally, and also at the port site.  She is not aware of air any Theresa Bradley or seepage or skin wound breakdown.  She does not complain of numbness in the axilla or upper right arm.  Since her last visit here her father who was 69 and had significant Alzheimer's got out of the house at night fell hit his head and and passed.  She is very concerned about her mother who is generally doing well but now lives alone and of course we does Mother's Day weekend.  Finally Theresa Bradley may have a tooth infection and will be seeing her dentist regarding that   HISTORY OF CURRENT ILLNESS: From the original intake note:   Theresa Bradley was my patient a little over 11 years ago when she underwent lumpectomy on 05/15/2008 for ductal carcinoma in situ and received radiation therapy under Dr. Valere Bradley. 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  More recently, she presented to her PCP with a small palpable right breast lump around 11 o'clock, just superior and lateral to the nipple. She underwent bilateral diagnostic mammography with tomography and right breast ultrasonography at [facility] on 12/26/2019 showing: breast density category B; 6 mm superficial mass in the right breast at 11 o'clock in the retroareolar region; no enlarged adenopathy in right axilla.  Accordingly on 12/30/2019 she proceeded to biopsy of the right breast area in question. The pathology from this procedure ([id number]) showed: invasive ductal carcinoma, grade 2. Prognostic indicators significant for: estrogen receptor, 95%  positive and progesterone receptor, 80% positive, both with strong staining intensity. Proliferation marker Ki67 at 20%. HER2 positive by immunohistochemistry (3+).  The patient's subsequent history is as detailed below.   PAST  MEDICAL HISTORY: Past Medical History:  Diagnosis Date  . Anxiety   . Breast cancer ([place]) 2009   right  . Cancer ([place])   . COUGH, CHRONIC 05/10/2010  . Dysrhythmia    occationally "skips a beat"  . Family history of brain cancer   . Family history of breast cancer   . Family history of lung cancer   . Family history of multiple myeloma   . Family history of skin cancer   . GERD (gastroesophageal reflux disease)   . HYPERTENSION 04/16/2009  . HYPOTHYROIDISM 04/16/2009  . OSTEOARTHRITIS 04/16/2009  . Personal history of radiation therapy   . SENILE LENTIGO 06/10/2010    PAST SURGICAL HISTORY: Past Surgical History:  Procedure Laterality Date  . BREAST EXCISIONAL BIOPSY Right   . BREAST EXCISIONAL BIOPSY Left   . BREAST LUMPECTOMY Right 2009   radiation only  . BREAST LUMPECTOMY WITH RADIOACTIVE SEED AND SENTINEL LYMPH NODE BIOPSY Right 01/31/2020   Procedure: RIGHT BREAST LUMPECTOMY WITH RADIOACTIVE SEED AND SENTINEL LYMPH NODE BIOPSY;  Surgeon: Theresa Luna, MD;  Location: [facility];  Service: General;  Laterality: Right;  PEC BLOCK  . BREAST SURGERY  2009   X 2, cancer Bradley 0, lumpectomy  . [place]  . CHOLECYSTECTOMY    . DILATATION & CURETTAGE/HYSTEROSCOPY WITH MYOSURE  11/2015  . HERNIA REPAIR  2009  . JOINT REPLACEMENT  2008   hip  . OPEN SURGICAL REPAIR OF GLUTEAL TENDON Left 07/18/2019   Procedure: Left hip bearing surface revision with gluteal tendon repair;  Surgeon: Gaynelle Arabian, MD;  Location: [facility];  Service: Orthopedics;  Laterality: Left;  2 hrs  . PORTACATH PLACEMENT N/A 03/15/2020   Procedure: INSERTION PORT-A-CATH WITH ULTRASOUND GUIDANCE;  Surgeon: Theresa Luna, MD;  Location: [facility];  Service: General;  Laterality: N/A;  . SIMPLE MASTECTOMY WITH AXILLARY SENTINEL NODE BIOPSY Right 03/15/2020   Procedure: RIGHT SIMPLE MASTECTOMY;  Surgeon: Theresa Luna, MD;  Location: [facility];   Service: General;  Laterality: Right;    FAMILY HISTORY: Family History  Problem Relation Age of Onset  . Arthritis Other   . Hypertension Other   . Cancer Other        2 aunts  . Hyperlipidemia Father   . Dementia Father   . Skin cancer Father   . Skin cancer Mother   . Breast cancer Maternal Aunt 63  . Breast cancer Maternal Aunt        dx. mid-70s  . Lung cancer Paternal Uncle   . Multiple myeloma Paternal Uncle   . Cancer Paternal Aunt        unknown type, dx. >50  . Brain cancer Cousin        dx. 68s, paternal cousin   The patient's father is 26 years old and the patient's mother is 47 years old as of February 2021.  The patient has one brother, no sisters.  A maternal aunt was diagnosed with breast cancer at age 72.  A paternal uncle was diagnosed with lung cancer in his 80s and another paternal uncle with a "blood cancer" in his late 32s.  There is no history of ovarian pancreatic or prostate cancer in the family to her knowledge.  GYNECOLOGIC HISTORY:  No LMP recorded. Patient is postmenopausal. Menarche: 69 years old Age at first live birth: 69 years old [place] P 2 LMP HRT no  Hysterectomy?  No BSO?  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  Her husband [place] that for example checks for mold and then remediate.  Son Theresa Bradley, 29 years old, lives in [place] and works as a Clinical biochemist.  He has 2 sons.  The patient's son Theresa Bradley 68 lives in [place] and works in Chief Executive Officer.  He has no children.  The patient is Ep[employer]alian    ADVANCED DIRECTIVES: In the absence of any documentation to the contrary, the patient's spouse is their HCPOA.    HEALTH MAINTENANCE: Social History   Tobacco Use  . Smoking status: Never Smoker  . Smokeless tobacco: Never Used  . Tobacco comment: father smoked when she was younger   Substance  Use Topics  . Alcohol use: Yes    Comment: occasionally   . Drug use: No     Colonoscopy: 03/2009/[facility]  PAP: 10/2013, negative  Bone density: 11/2015, T score -1.7   Allergies  Allergen Reactions  . Codeine Sulfate Nausea And Vomiting  . Erythromycin [place] Other (See Comments)    Stomach ache  . Esomeprazole Magnesium     REACTION: GI upset    Current Outpatient Medications  Medication Sig Dispense Refill  . ALPRAZolam (XANAX) 0.25 MG tablet TAKE 1/2 TABLET BY MOUTH AT BEDTIME AS NEEDED INSOMNIA 45 tablet 0  . amLODipine (NORVASC) 5 MG tablet Take 1 tablet (5 mg total) by mouth daily. 90 tablet 3  . BIOTIN 5000 PO Take by mouth.    . Calcium Carbonate-Vitamin D 600-200 MG-UNIT TABS Take 1 tablet by mouth daily.    . citalopram (CELEXA) 20 MG tablet Take 1 tablet (20 mg total) by mouth daily. 90 tablet 3  . docusate sodium (COLACE) 100 MG capsule Take 100 mg by mouth daily as needed for mild constipation.     Marland Kitchen EPIPEN 2-PAK 0.3 MG/0.3ML SOAJ injection Inject 0.3 mg into the muscle as needed for anaphylaxis.     . famotidine (PEPCID) 10 MG tablet Take 10 mg by mouth daily.     Marland Kitchen levothyroxine (SYNTHROID) 50 MCG tablet TAKE 1 TABLET BY MOUTH EVERY DAY 90 tablet 1  . lidocaine-prilocaine (EMLA) cream Apply to affected area once 30 g 3  . meloxicam (MOBIC) 15 MG tablet TAKE 1 TABLET BY MOUTH EVERY DAY 90 tablet 1  . mupirocin ointment (BACTROBAN) 2 % APPLY TO AFFECTED AREA EVERY DAY    . oxyCODONE (OXY IR/ROXICODONE) 5 MG immediate release tablet Take 1 tablet (5 mg total) by mouth every 6 (six) hours as needed for severe pain. 15 tablet 0  . prochlorperazine (COMPAZINE) 10 MG tablet Take 1 tablet (10 mg total) by mouth every 6 (six) hours as needed (Nausea or vomiting). 30 tablet 1  . tretinoin (RETIN-A) 0.025 % cream Apply topically at bedtime. 45 g 6   No current facility-administered medications for this visit.    OBJECTIVE: white woman who appears younger than stated  age  69:   04/02/20 0932  BP: 133/63  Pulse: 67  Resp: 18  Temp: 97.8 F (36.6 C)  SpO2: 98%     Body mass index is 26.61 kg/m.   Wt Readings from Last 3 Encounters:  04/02/20  155 lb (70.3 kg)  03/15/20 157 lb 10.1 oz (71.5 kg)  02/06/20 156 lb 1.6 oz (70.8 kg)      ECOG FS:1 - Symptomatic but completely ambulatory  Sclerae unicteric, EOMs intact Wearing a mask No cervical or supraclavicular adenopathy Lungs no rales or rhonchi Heart regular rate and rhythm Abd soft, nontender, positive bowel sounds MSK no focal spinal tenderness, no upper extremity lymphedema Neuro: nonfocal, well oriented, appropriate affect Breasts: The right breast is status Bradley mastectomy.  The incision is healing very nicely.  There is no erythema swelling or dehiscence.  The port site also is intact.  The left breast is benign.  Both axillae are benign.   LAB RESULTS:  CMP     Component Value Date/Time   NA 140 04/02/2020 0916   K 3.5 04/02/2020 0916   CL 102 04/02/2020 0916   CO2 29 04/02/2020 0916   GLUCOSE 137 (H) 04/02/2020 0916   BUN 8 04/02/2020 0916   CREATININE 0.85 04/02/2020 0916   CREATININE 0.81 01/11/2020 1209   CALCIUM 9.2 04/02/2020 0916   PROT 7.1 04/02/2020 0916   ALBUMIN 3.6 04/02/2020 0916   AST 15 04/02/2020 0916   AST 14 (L) 01/11/2020 1209   ALT 15 04/02/2020 0916   ALT 13 01/11/2020 1209   ALKPHOS 73 04/02/2020 0916   BILITOT 0.5 04/02/2020 0916   BILITOT 0.5 01/11/2020 1209   GFRNONAA >60 04/02/2020 0916   GFRNONAA >60 01/11/2020 1209   GFRAA >60 04/02/2020 0916   GFRAA >60 01/11/2020 1209    No results found for: TOTALPROTELP, ALBUMINELP, A1GS, A2GS, BETS, BETA2SER, GAMS, MSPIKE, SPEI  Lab Results  Component Value Date   WBC 6.1 04/02/2020   NEUTROABS 3.2 04/02/2020   HGB 13.3 04/02/2020   HCT 41.1 04/02/2020   MCV 89.7 04/02/2020   PLT 281 04/02/2020    No results found for: LABCA2  No components found for: GHWEXH371  No results for  input(s): INR in the last 168 hours.  No results found for: LABCA2  No results found for: IRC789  No results found for: FYB017  No results found for: PZW258  No results found for: CA2729  No components found for: HGQUANT  No results found for: CEA1 / No results found for: CEA1   No results found for: AFPTUMOR  No results found for: CHROMOGRNA  No results found for: KPAFRELGTCHN, LAMBDASER, KAPLAMBRATIO (kappa/lambda light chains)  No results found for: HGBA, HGBA2QUANT, HGBFQUANT, HGBSQUAN (Hemoglobinopathy evaluation)   No results found for: LDH  No results found for: IRON, TIBC, IRONPCTSAT (Iron and TIBC)  No results found for: FERRITIN  Urinalysis    Component Value Date/Time   COLORURINE yellow 05/24/2010 0855   APPEARANCEUR Clear 05/24/2010 0855   LABSPEC 1.015 05/24/2010 0855   PHURINE 6.0 05/24/2010 0855   GLUCOSEU NEGATIVE 09/22/2007 1110   HGBUR negative 05/24/2010 0855   BILIRUBINUR n 06/29/2019 1533   KETONESUR NEGATIVE 09/22/2007 1110   PROTEINUR Negative 06/29/2019 1533   PROTEINUR NEGATIVE 09/22/2007 1110   UROBILINOGEN 0.2 06/29/2019 1533   UROBILINOGEN 0.2 05/24/2010 0855   NITRITE n 06/29/2019 1533   NITRITE negative 05/24/2010 0855   LEUKOCYTESUR Negative 06/29/2019 1533     STUDIES: DG Chest Port 1 View  Result Date: 03/15/2020 CLINICAL DATA:  69 year old female status Bradley port placement. EXAM: PORTABLE CHEST 1 VIEW COMPARISON:  Chest radiograph dated 05/31/2014. FINDINGS: Right-sided Port-A-Cath with tip close to the cavoatrial junction. There is no focal consolidation, pleural effusion, or  pneumothorax. Stable mild cardiomegaly. Atherosclerotic calcification of the aortic arch. A tube overlies the right side of the chest. No acute osseous pathology. IMPRESSION: Right-sided Port-A-Cath with tip close to the cavoatrial junction. No pneumothorax. Electronically Signed   By: Anner Crete M.D.   On: 03/15/2020 15:42   DG Fluoro Guide CV  Line-No Report  Result Date: 03/15/2020 Fluoroscopy was utilized by the requesting physician.  No radiographic interpretation.     ELIGIBLE FOR AVAILABLE RESEARCH PROTOCOL: no  ASSESSMENT: 69 y.o.  woman status Bradley right breast periareolar biopsy 12/30/2019 for a clinical T1a N0, Bradley I invasive ductal carcinoma, grade 2, estrogen and progesterone receptor positive, HER-2 amplified, with an MIB-one of 20%.  (1) history of prior right lumpectomy June 2009 for ductal carcinoma in situ  (a) status Bradley adjuvant radiation  (b) did not receive antiestrogens  (2) status Bradley repeat right lumpectomy and sentinel lymph node sampling 01/31/2020 for a pT1c pN0, Bradley IA invasive ductal carcinoma, grade 2, with positive margins  (a) total one sentinel node removed  (3) s/p right mastectomy 03/15/2020 showing residual invasive ductal carcinoma measuring 0.9 cm, but negative margins (added to prior 1.1 lumpectomy, still T1 N0 or Bradley IA)  (a) one additional axillary node was removed (total 2 right axillary nodes removed)  (3) adjuvant chemotherapy with paclitaxel and trastuzumab weekly x 12 to start 04/17/2020  (4) trastuzumab to be continued every 21 days to total one year  (a) echo 01/17/2020 shows an ejection fraction in the 60-65% range  (5) to start antiestrogens at the completion of local treatment.  (6) genetics testing 02/01/2020 through the [facility] STAT panel found no deleterious mutations then ATM, BRCA1, BRCA2, CDH1, CHEK2, PALB2, PTEN, STK11 and TP53.    PLAN: Mirian is recovering well from her second surgery, and she understands that these are always a little bit more uncomfortable than the first ones.  She is not having any arm swelling but I think she will benefit from physical therapy and she does have an appointment with Raeanne Gathers already in place.  She is ready to start Taxol and trastuzumab.  We discussed the possible toxicities side effects and  complications of these agents in detail today.  She was specifically alerted regarding neuropathy concerns and there are no neuropathy symptoms at baseline.  She had an echocardiogram in February which showed a very favorable ejection fraction.  She will meet with our chemotherapy teaching nurse today for reinforcement of all this information as well as a tour of the treatment area  I was sorry to learn of her Bradley's death.  She tells me he had a very good life and was 69 years old which helps a little.  She will be visiting her mother next week and that is why she prefers not to start chemotherapy until 04/17/2020.  I am going to see her with the second dose of Taxol to troubleshoot side effects.  She will keep a diary which will help [place] do that  Total encounter time 40 minutes.  Theresa Cruel, MD   04/02/2020 11:09 AM Medical Oncology and Hematology [facility] [address], [place] [address] Tel. [phone]    Fax. [phone]   This document serves as a record of services personally performed by Lurline Del, MD. It was created on his behalf by Wilburn Mylar, a trained medical scribe. The creation of this record is based on the scribe's personal observations  and the provider's statements to them.   I, Lurline Del MD, have reviewed the above documentation for accuracy and completeness, and I agree with the above.   *Total Encounter Time as defined by the Centers for Medicare and Medicaid Services includes, in addition to the face-to-face time of a patient visit (documented in the note above) non-face-to-face time: obtaining and reviewing outside history, ordering and reviewing medications, tests or procedures, care coordination (communications with other health care professionals or caregivers) and documentation in the medical record.

## 2020-04-01 MED ORDER — PROCHLORPERAZINE MALEATE 10 MG PO TABS: 10.0000 mg | ORAL_TABLET | Freq: Four times a day (QID) | ORAL | 1 refills | Status: DC | PRN

## 2020-04-01 MED ORDER — LIDOCAINE-PRILOCAINE 2.5-2.5 % EX CREA: TOPICAL_CREAM | CUTANEOUS | 3 refills | Status: DC

## 2020-04-02 ENCOUNTER — Inpatient Hospital Stay: Payer: Medicare Other

## 2020-04-02 ENCOUNTER — Encounter: Payer: Self-pay | Admitting: *Deleted

## 2020-04-02 ENCOUNTER — Other Ambulatory Visit: Payer: Self-pay

## 2020-04-02 ENCOUNTER — Inpatient Hospital Stay: Payer: Medicare Other | Attending: Oncology | Admitting: Oncology

## 2020-04-02 VITALS — BP 133/63 | HR 67 | Temp 97.8°F | Resp 18 | Ht 64.0 in | Wt 155.0 lb

## 2020-04-02 DIAGNOSIS — C50111 Malignant neoplasm of central portion of right female breast: Secondary | ICD-10-CM | POA: Insufficient documentation

## 2020-04-02 DIAGNOSIS — Z808 Family history of malignant neoplasm of other organs or systems: Secondary | ICD-10-CM | POA: Insufficient documentation

## 2020-04-02 DIAGNOSIS — C50411 Malignant neoplasm of upper-outer quadrant of right female breast: Secondary | ICD-10-CM

## 2020-04-02 DIAGNOSIS — Z9011 Acquired absence of right breast and nipple: Secondary | ICD-10-CM | POA: Diagnosis not present

## 2020-04-02 DIAGNOSIS — I10 Essential (primary) hypertension: Secondary | ICD-10-CM | POA: Diagnosis not present

## 2020-04-02 DIAGNOSIS — Z803 Family history of malignant neoplasm of breast: Secondary | ICD-10-CM | POA: Diagnosis not present

## 2020-04-02 DIAGNOSIS — Z801 Family history of malignant neoplasm of trachea, bronchus and lung: Secondary | ICD-10-CM | POA: Insufficient documentation

## 2020-04-02 DIAGNOSIS — J301 Allergic rhinitis due to pollen: Secondary | ICD-10-CM | POA: Diagnosis not present

## 2020-04-02 DIAGNOSIS — Z170 Estrogen receptor positive status [ER+]: Secondary | ICD-10-CM | POA: Insufficient documentation

## 2020-04-02 DIAGNOSIS — J3081 Allergic rhinitis due to animal (cat) (dog) hair and dander: Secondary | ICD-10-CM | POA: Diagnosis not present

## 2020-04-02 DIAGNOSIS — J3089 Other allergic rhinitis: Secondary | ICD-10-CM | POA: Diagnosis not present

## 2020-04-02 DIAGNOSIS — Z807 Family history of other malignant neoplasms of lymphoid, hematopoietic and related tissues: Secondary | ICD-10-CM | POA: Diagnosis not present

## 2020-04-02 LAB — CBC WITH DIFFERENTIAL/PLATELET
Abs Immature Granulocytes: 0.01 10*3/uL (ref 0.00–0.07)
Basophils Absolute: 0.1 10*3/uL (ref 0.0–0.1)
Basophils Relative: 1 %
Eosinophils Absolute: 0.6 10*3/uL — ABNORMAL HIGH (ref 0.0–0.5)
Eosinophils Relative: 10 %
HCT: 41.1 % (ref 36.0–46.0)
Hemoglobin: 13.3 g/dL (ref 12.0–15.0)
Immature Granulocytes: 0 %
Lymphocytes Relative: 27 %
Lymphs Abs: 1.7 10*3/uL (ref 0.7–4.0)
MCH: 29 pg (ref 26.0–34.0)
MCHC: 32.4 g/dL (ref 30.0–36.0)
MCV: 89.7 fL (ref 80.0–100.0)
Monocytes Absolute: 0.6 10*3/uL (ref 0.1–1.0)
Monocytes Relative: 10 %
Neutro Abs: 3.2 10*3/uL (ref 1.7–7.7)
Neutrophils Relative %: 52 %
Platelets: 281 10*3/uL (ref 150–400)
RBC: 4.58 MIL/uL (ref 3.87–5.11)
RDW: 12.8 % (ref 11.5–15.5)
WBC: 6.1 10*3/uL (ref 4.0–10.5)
nRBC: 0 % (ref 0.0–0.2)

## 2020-04-02 LAB — COMPREHENSIVE METABOLIC PANEL
ALT: 15 U/L (ref 0–44)
AST: 15 U/L (ref 15–41)
Albumin: 3.6 g/dL (ref 3.5–5.0)
Alkaline Phosphatase: 73 U/L (ref 38–126)
Anion gap: 9 (ref 5–15)
BUN: 8 mg/dL (ref 8–23)
CO2: 29 mmol/L (ref 22–32)
Calcium: 9.2 mg/dL (ref 8.9–10.3)
Chloride: 102 mmol/L (ref 98–111)
Creatinine, Ser: 0.85 mg/dL (ref 0.44–1.00)
GFR calc Af Amer: >60 mL/min (ref >60)
GFR calc non Af Amer: >60 mL/min (ref >60)
Glucose, Bld: 137 mg/dL — ABNORMAL HIGH (ref 70–99)
Potassium: 3.5 mmol/L (ref 3.5–5.1)
Sodium: 140 mmol/L (ref 135–145)
Total Bilirubin: 0.5 mg/dL (ref 0.3–1.2)
Total Protein: 7.1 g/dL (ref 6.5–8.1)

## 2020-04-03 ENCOUNTER — Telehealth: Payer: Self-pay | Admitting: Oncology

## 2020-04-03 NOTE — Telephone Encounter (Signed)
Scheduled appts per 5/10 los. Pt confirmed appt date and time.

## 2020-04-05 ENCOUNTER — Other Ambulatory Visit: Payer: Self-pay | Admitting: Oncology

## 2020-04-06 ENCOUNTER — Encounter: Payer: Self-pay | Admitting: *Deleted

## 2020-04-16 ENCOUNTER — Encounter: Payer: Self-pay | Admitting: Physical Therapy

## 2020-04-16 ENCOUNTER — Other Ambulatory Visit: Payer: Self-pay

## 2020-04-16 ENCOUNTER — Ambulatory Visit: Payer: Medicare Other | Attending: Surgery | Admitting: Physical Therapy

## 2020-04-16 DIAGNOSIS — Z170 Estrogen receptor positive status [ER+]: Secondary | ICD-10-CM | POA: Insufficient documentation

## 2020-04-16 DIAGNOSIS — R293 Abnormal posture: Secondary | ICD-10-CM | POA: Diagnosis not present

## 2020-04-16 DIAGNOSIS — J3089 Other allergic rhinitis: Secondary | ICD-10-CM | POA: Diagnosis not present

## 2020-04-16 DIAGNOSIS — R600 Localized edema: Secondary | ICD-10-CM | POA: Diagnosis not present

## 2020-04-16 DIAGNOSIS — C50411 Malignant neoplasm of upper-outer quadrant of right female breast: Secondary | ICD-10-CM

## 2020-04-16 DIAGNOSIS — Z483 Aftercare following surgery for neoplasm: Secondary | ICD-10-CM | POA: Diagnosis not present

## 2020-04-16 DIAGNOSIS — J3081 Allergic rhinitis due to animal (cat) (dog) hair and dander: Secondary | ICD-10-CM | POA: Diagnosis not present

## 2020-04-16 DIAGNOSIS — M25611 Stiffness of right shoulder, not elsewhere classified: Secondary | ICD-10-CM | POA: Diagnosis not present

## 2020-04-16 DIAGNOSIS — J301 Allergic rhinitis due to pollen: Secondary | ICD-10-CM | POA: Diagnosis not present

## 2020-04-16 NOTE — Therapy (Signed)
[place], [place], [address] Phone: [phone]   Fax:  [phone]  Physical Therapy Treatment  Patient Details  Name: Theresa Bradley MRN: [id number] Date of Birth: Dec 11, 1950 Referring Provider (PT): Dr. [name]   Encounter Date: 04/16/2020  PT End of Session - 04/16/20 1208    Visit Number  2    Number of Visits  10    Date for PT Re-Evaluation  05/14/20    PT Start Time  1000    PT Stop Time  1052    PT Time Calculation (min)  52 min    Activity Tolerance  Patient tolerated treatment well    Behavior During Therapy  [facility] for tasks assessed/performed       Past Medical History:  Diagnosis Date  . Anxiety   . Breast cancer ([place]) 2009   right  . Cancer ([employer])   . COUGH, CHRONIC 05/10/2010  . Dysrhythmia    occationally "skips a beat"  . Family history of brain cancer   . Family history of breast cancer   . Family history of lung cancer   . Family history of multiple myeloma   . Family history of skin cancer   . GERD (gastroesophageal reflux disease)   . HYPERTENSION 04/16/2009  . HYPOTHYROIDISM 04/16/2009  . OSTEOARTHRITIS 04/16/2009  . Personal history of radiation therapy   . SENILE LENTIGO 06/10/2010    Past Surgical History:  Procedure Laterality Date  . BREAST EXCISIONAL BIOPSY Right   . BREAST EXCISIONAL BIOPSY Left   . BREAST LUMPECTOMY Right 2009   radiation only  . BREAST LUMPECTOMY WITH RADIOACTIVE SEED AND SENTINEL LYMPH NODE BIOPSY Right 01/31/2020   Procedure: RIGHT BREAST LUMPECTOMY WITH RADIOACTIVE SEED AND SENTINEL LYMPH NODE BIOPSY;  Surgeon: [name], MD;  Location: [place];  Service: General;  Laterality: Right;  PEC BLOCK  . BREAST SURGERY  2009   X 2, cancer stage 0, lumpectomy  . [place]  . CHOLECYSTECTOMY    . DILATATION & CURETTAGE/HYSTEROSCOPY WITH MYOSURE  11/2015  . HERNIA REPAIR  2009  . JOINT  REPLACEMENT  2008   hip  . OPEN SURGICAL REPAIR OF GLUTEAL TENDON Left 07/18/2019   Procedure: Left hip bearing surface revision with gluteal tendon repair;  Surgeon: Gaynelle Arabian, MD;  [facility];  Service: Orthopedics;  Laterality: Left;  2 hrs  . PORTACATH PLACEMENT N/A 03/15/2020   Procedure: INSERTION PORT-A-CATH WITH ULTRASOUND GUIDANCE;  Surgeon: [name], MD;  Location: [place];  Service: General;  Laterality: N/A;  . SIMPLE MASTECTOMY WITH AXILLARY SENTINEL NODE BIOPSY Right 03/15/2020   Procedure: RIGHT SIMPLE MASTECTOMY;  Surgeon: [name], MD;  Location: [place];  Service: General;  Laterality: Right;    There were no vitals filed for this visit.  Subjective Assessment - 04/16/20 1005    Subjective  Patient underwent a right lumpectomy and sentinel node biopsy (1 negative node) on 01/31/2020 but then underwent a right mastectomy and another negative node removed on 03/15/2020 due to not getting clear margins. She will begin chemotherapy on 04/24/2020.    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 Patient underwent a right lumpectomy and sentinel node biopsy (1 negative node) on 01/31/2020 but then underwent a right mastectomy and another negative node removed on 03/15/2020 due to not getting clear margins.  Ki67 is 20%. She has a history of a left hip replacement in 2008 and a revision in 06/2019. 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 She underwent a right lumpectomy and radiation at that time.    Patient Stated Goals  See if my arm is ok    Currently in Pain?  Yes    Pain Score  2     Pain Location  Axilla    Pain Orientation  Right    Pain Descriptors / Indicators  Heaviness    Pain Type  Surgical pain    Pain Onset  1 to 4 weeks ago    Pain Frequency  Intermittent    Aggravating Factors   Doing more activity    Pain Relieving Factors   resting         [facility] PT Assessment - 04/16/20 0001      Assessment   Medical Diagnosis  Right breast cancer    Referring Provider (PT)  Dr. Marcello Moores [place]    Onset Date/Surgical Date  01/30/20    Hand Dominance  Right    Prior Therapy  Baselines      Precautions   Precautions  Other (comment)    Precaution Comments  recent surgery; right arm lymphedema risk      Restrictions   Weight Bearing Restrictions  No      Balance Screen   Has the patient fallen in the past 6 months  No    Has the patient had a decrease in activity level because of a fear of falling?   No    Is the patient reluctant to leave their home because of a fear of falling?   No      Home Environment   Living Environment  Private residence    Living Arrangements  Spouse/significant other    Available Help at Discharge  Family      Prior Function   Level of Independence  Independent    Vocation  Full time employment    Education officer, environmental    Leisure  She does not exercise      Cognition   Overall Cognitive Status  Within Functional Limits for tasks assessed      Observation/Other Assessments   Observations  Chest and axillary incisions are healing well. There is visible fluid present in her right chest extending into her lateral trunk. No cording or redness noted.      Posture/Postural Control   Posture/Postural Control  Postural limitations    Postural Limitations  Rounded Shoulders;Forward head      ROM / Strength   AROM / PROM / Strength  AROM      AROM   AROM Assessment Site  Shoulder    Right/Left Shoulder  Right    Right Shoulder Extension  43 Degrees    Right Shoulder Flexion  123 Degrees    Right Shoulder ABduction  133 Degrees    Right Shoulder Internal Rotation  74 Degrees    Right Shoulder External Rotation  73 Degrees      Strength   Overall Strength  Unable to assess;Due to precautions        LYMPHEDEMA/ONCOLOGY QUESTIONNAIRE - 04/16/20 1009      Type   Cancer  Type  Right breast cancer      Surgeries   Mastectomy Date  03/15/20    Lumpectomy Date  01/30/20    Sentinel Lymph Node Biopsy Date  01/30/20  Number Lymph Nodes Removed  2      Treatment   Active Chemotherapy Treatment  No    Past Chemotherapy Treatment  No    Active Radiation Treatment  No    Past Radiation Treatment  Yes    Date  11/25/07    Body Site  right breast    Current Hormone Treatment  No    Past Hormone Therapy  No      What other symptoms do you have   Are you Having Heaviness or Tightness  Yes    Are you having Pain  Yes    Are you having pitting edema  No    Is it Hard or Difficult finding clothes that fit  No    Do you have infections  No    Is there Decreased scar mobility  Yes    [place] Sign  No      Lymphedema Assessments   Lymphedema Assessments  Upper extremities      Right Upper Extremity Lymphedema   10 cm Proximal to Olecranon Process  27.1 cm    Olecranon Process  23.9 cm    10 cm Proximal to Ulnar Styloid Process  21.1 cm    Just Proximal to Ulnar Styloid Process  15.3 cm    Across Hand at [employer]  19.4 cm    At [place] of 2nd Digit  5.9 cm      Left Upper Extremity Lymphedema   10 cm Proximal to Olecranon Process  25.9 cm    Olecranon Process  23.5 cm    10 cm Proximal to Ulnar Styloid Process  20.3 cm    Just Proximal to Ulnar Styloid Process  15 cm    Across Hand at [employer]  18.5 cm    At [place] of 2nd Digit  6 cm         Quick Dash - 04/16/20 0001    Open a tight or new jar  Moderate difficulty    Do heavy household chores (wash walls, wash floors)  Severe difficulty    Carry a shopping bag or briefcase  Mild difficulty    Wash your back  Mild difficulty    Use a knife to cut food  No difficulty    Recreational activities in which you take some force or impact through your arm, shoulder, or hand (golf, hammering, tennis)  No difficulty    During the past week, to what extent has your arm, shoulder or hand problem  interfered with your normal social activities with family, friends, neighbors, or groups?  Not at all    During the past week, to what extent has your arm, shoulder or hand problem limited your work or other regular daily activities  Slightly    Arm, shoulder, or hand pain.  Mild    Tingling (pins and needles) in your arm, shoulder, or hand  None    Difficulty Sleeping  Mild difficulty    DASH Score  22.73 %                          PT Long Term Goals - 04/16/20 1221      PT LONG TERM GOAL #1   Title  Patient will demonstrate she has regained full shoulder ROM and function post operatively compared to baselines.    Time  4    Period  Weeks    Status  On-going  Target Date  05/14/20      PT LONG TERM GOAL #2   Title  Patient will increase right shoulder flexion to >/= 135 degrees for increased ease reaching overhead.    Baseline  135 pre-op; 123 post op    Time  4    Period  Weeks    Status  New    Target Date  05/14/20      PT LONG TERM GOAL #3   Title  Patient will increase right shoulder abduction to >/= 150 degrees for increased ease reaching overhead.    Baseline  133 post op; 152 pre-op    Time  4    Period  Weeks    Status  New    Target Date  05/14/20      PT LONG TERM GOAL #4   Title  Patient will report >/= 50% decrease in right chest edema for increase comfort.    Time  4    Period  Weeks    Status  New    Target Date  05/14/20      PT LONG TERM GOAL #5   Title  Patient will verbalize good understanding of risk reduction practices for lymphedema.    Time  4    Period  Weeks    Status  New    Target Date  05/14/20            Plan - 04/16/20 1059    Clinical Impression Statement  Patient has had a difficult time the past few weeks. She underwent a left lumpectomy and sentinel node biopsy (1 negative node) on 01/30/2020 but then had to have a right mastectomy and another negative node was removed. She is recovering from those surgeries  but her father died during that time as well. She has limited shoulder ROM on the right side and palpable and visible edema present in her right chest. She will benefit from PT to address those deficits and plans to come to PT 1-2x/week as she begins chemotherapy. She will also benefit from the ABC class to be educated on lymphedema risk reduction.    PT Frequency  2x / week    PT Duration  4 weeks    PT Treatment/Interventions  ADLs/Self Care Home Management;Therapeutic exercise;Patient/family education;Manual techniques;Manual lymph drainage;Passive range of motion;Scar mobilization    PT Next Visit Plan  Manual lymph drainage right chest to decrease edema; PROM right shoulder to increase shoulder flexion and abduction. Pt planning to [facility] to [facility] 04/19/2020 for a compression bra.    PT Home Exercise Plan  Post op shoulder ROM HEP    Consulted and Agree with Plan of Care  Patient       Patient will benefit from skilled therapeutic intervention in order to improve the following deficits and impairments:  Postural dysfunction, Decreased range of motion, Impaired UE functional use, Pain, Decreased knowledge of precautions, Increased fascial restricitons, Increased edema  Visit Diagnosis: Malignant neoplasm of upper-outer quadrant of right breast in female, estrogen receptor positive ([place]) - Plan: PT plan of care cert/re-cert  Abnormal posture - Plan: PT plan of care cert/re-cert  Aftercare following surgery for neoplasm - Plan: PT plan of care cert/re-cert  Localized edema - Plan: PT plan of care cert/re-cert  Stiffness of right shoulder, not elsewhere classified - Plan: PT plan of care cert/re-cert     Problem List Patient Active Problem List   Diagnosis Date Noted  . Genetic testing 02/03/2020  .  Family history of breast cancer   . Family history of skin cancer   . Family history of brain cancer   . Family history of lung cancer   . Family history of multiple myeloma   .  Malignant neoplasm of upper-outer quadrant of right breast in female, estrogen receptor positive ([place]) 01/04/2020  . Melanoma of skin ([place]) 08/15/2019  . 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 Failed total hip arthroplasty ([place]) 07/18/2019  . Cough 05/31/2014  . GERD (gastroesophageal reflux disease) 09/13/2012  . HYPOTHYROIDISM 04/16/2009  . Essential hypertension 04/16/2009  . Osteoarthritis 04/16/2009   Annia Friendly, PT 04/16/20 12:28 PM  [place], [place], [address] Phone: [phone]   Fax:  [phone]  Name: Theresa Bradley MRN: [id number] Date of Birth: Sep 19, 1951

## 2020-04-16 NOTE — Patient Instructions (Signed)
            [facility]         [address], [place] [address]         [phone]         Annia Friendly, PT, CLT   After Breast Cancer Class It is recommended you attend the ABC class to be educated on lymphedema risk reduction. This class is free of charge and lasts for 1 hour. It is a 1-time class.    Scar massage You can begin gently massaging you scar with coconut oil - a few minutes twice a day.   Compression garment You would benefit from a compression bra to reduce the swelling in your right chest and on your right side under your armpit. You can go to [facility] for this.  Home exercise Program Begin doing the home exercises given pre-operatively twice a day.

## 2020-04-17 ENCOUNTER — Other Ambulatory Visit: Payer: Medicare Other

## 2020-04-17 ENCOUNTER — Inpatient Hospital Stay: Payer: Medicare Other

## 2020-04-17 NOTE — Progress Notes (Signed)
Pharmacist Chemotherapy Monitoring - Follow Up Assessment    I verify that I have reviewed each item in the below checklist:  . Regimen for the patient is scheduled for the appropriate day and plan matches scheduled date. Marland Kitchen Appropriate non-routine labs are ordered dependent on drug ordered. . If applicable, additional medications reviewed and ordered per protocol based on lifetime cumulative doses and/or treatment regimen.   Plan for follow-up and/or issues identified: No . I-vent associated with next due treatment: No . MD and/or nursing notified: No  Zaelyn Barbary D 04/17/2020 3:44 PM

## 2020-04-18 DIAGNOSIS — C50911 Malignant neoplasm of unspecified site of right female breast: Secondary | ICD-10-CM | POA: Diagnosis not present

## 2020-04-19 ENCOUNTER — Encounter: Payer: Self-pay | Admitting: Physical Therapy

## 2020-04-19 ENCOUNTER — Ambulatory Visit: Payer: Medicare Other

## 2020-04-19 ENCOUNTER — Other Ambulatory Visit: Payer: Self-pay

## 2020-04-19 DIAGNOSIS — R600 Localized edema: Secondary | ICD-10-CM | POA: Diagnosis not present

## 2020-04-19 DIAGNOSIS — Z170 Estrogen receptor positive status [ER+]: Secondary | ICD-10-CM

## 2020-04-19 DIAGNOSIS — C50411 Malignant neoplasm of upper-outer quadrant of right female breast: Secondary | ICD-10-CM

## 2020-04-19 DIAGNOSIS — M25611 Stiffness of right shoulder, not elsewhere classified: Secondary | ICD-10-CM | POA: Diagnosis not present

## 2020-04-19 DIAGNOSIS — Z483 Aftercare following surgery for neoplasm: Secondary | ICD-10-CM

## 2020-04-19 DIAGNOSIS — R293 Abnormal posture: Secondary | ICD-10-CM | POA: Diagnosis not present

## 2020-04-19 NOTE — Therapy (Signed)
[place], [place], [address] Phone: [phone]   Fax:  [phone]  Physical Therapy Treatment  Patient Details  Name: Theresa Bradley MRN: [id number] Date of Birth: 1951-07-30 Referring Provider (PT): Dr. [name]   Encounter Date: 04/19/2020  PT End of Session - 04/19/20 0933    Visit Number  3    Number of Visits  10    Date for PT Re-Evaluation  05/14/20    PT Start Time  0907    PT Stop Time  1000    PT Time Calculation (min)  53 min    Activity Tolerance  Patient tolerated treatment well    Behavior During Therapy  [facility] for tasks assessed/performed       Past Medical History:  Diagnosis Date  . Anxiety   . Breast cancer ([place]) 2009   right  . Cancer ([place])   . COUGH, CHRONIC 05/10/2010  . Dysrhythmia    occationally "skips a beat"  . Family history of brain cancer   . Family history of breast cancer   . Family history of lung cancer   . Family history of multiple myeloma   . Family history of skin cancer   . GERD (gastroesophageal reflux disease)   . HYPERTENSION 04/16/2009  . HYPOTHYROIDISM 04/16/2009  . OSTEOARTHRITIS 04/16/2009  . Personal history of radiation therapy   . SENILE LENTIGO 06/10/2010    Past Surgical History:  Procedure Laterality Date  . BREAST EXCISIONAL BIOPSY Right   . BREAST EXCISIONAL BIOPSY Left   . BREAST LUMPECTOMY Right 2009   radiation only  . BREAST LUMPECTOMY WITH RADIOACTIVE SEED AND SENTINEL LYMPH NODE BIOPSY Right 01/31/2020   Procedure: RIGHT BREAST LUMPECTOMY WITH RADIOACTIVE SEED AND SENTINEL LYMPH NODE BIOPSY;  Surgeon: [name], MD;  Location: [place];  Service: General;  Laterality: Right;  PEC BLOCK  . BREAST SURGERY  2009   X 2, cancer stage 0, lumpectomy  . [place]  . CHOLECYSTECTOMY    . DILATATION & CURETTAGE/HYSTEROSCOPY WITH MYOSURE  11/2015  . HERNIA REPAIR  2009  . JOINT  REPLACEMENT  2008   hip  . OPEN SURGICAL REPAIR OF GLUTEAL TENDON Left 07/18/2019   Procedure: Left hip bearing surface revision with gluteal tendon repair;  Surgeon: Gaynelle Arabian, MD;  Location[facility];  Service: Orthopedics;  Laterality: Left;  2 hrs  . PORTACATH PLACEMENT N/A 03/15/2020   Procedure: INSERTION PORT-A-CATH WITH ULTRASOUND GUIDANCE;  Surgeon: [name], MD;  Location: [place];  Service: General;  Laterality: N/A;  . SIMPLE MASTECTOMY WITH AXILLARY SENTINEL NODE BIOPSY Right 03/15/2020   Procedure: RIGHT SIMPLE MASTECTOMY;  Surgeon: [name], MD;  Location: [place];  Service: General;  Laterality: Right;    There were no vitals filed for this visit.  Subjective Assessment - 04/19/20 0929    Subjective  Pt states that she has received her compression bra. She has been trying to use coconut oil over her scar but touching the scar doesn't feel very good.    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 Patient underwent a right lumpectomy and sentinel node biopsy (1 negative node) on 01/31/2020 but then underwent a right mastectomy and another negative node removed on 03/15/2020 due to not getting clear margins. Ki67 is 20%. She has a history of a left hip  replacement in 2008 and a revision in 06/2019. 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 She underwent a right lumpectomy and radiation at that time.    Patient Stated Goals  See if my arm is ok    Currently in Pain?  No/denies    Pain Score  0-No pain                        [facility] Adult PT Treatment/Exercise - 04/19/20 0001      Manual Therapy   Manual Therapy  Manual Lymphatic Drainage (MLD);Passive ROM    Manual Lymphatic Drainage (MLD)  In supine: short neck, swimming in the terminus, bil shoulder collectors, bil axillary and R inguinal nodes, anterior inter-axillary and R  axillo-ingiunal anastomosis, re-worked nodes, 5 diaphragmatic breathes, Pt was then taught each step for self stimulation of the lymphatic system to help move fluid from the R lateral upper quadrant with good technique. A focus on skin stretch, direction and pressure utilizing hand over hand assistance from physical therapist and VC.     Passive ROM  P/ROM into flexion/abduction, D2 flexion pattern and External rotation with light end range stretch to pt tolerance w/o significant increase in stretching/pain 5x each direction slow movement             PT Education - 04/19/20 0955    Education Details  Pt educated on lymphatic anastomosis and how the lymphatic fluid is moved from the R upper quadrant along the anasotmosis. Discussed the importance of continuing to perform light stretching to maintain ROM. Pt was educated on self MLD with an emphasis on skin stretch, direction and pressure.    Person(s) Educated  Patient    Methods  Explanation;Demonstration;Tactile cues;Verbal cues;Handout    Comprehension  Verbalized understanding;Returned demonstration          PT Long Term Goals - 04/16/20 1221      PT LONG TERM GOAL #1   Title  Patient will demonstrate she has regained full shoulder ROM and function post operatively compared to baselines.    Time  4    Period  Weeks    Status  On-going    Target Date  05/14/20      PT LONG TERM GOAL #2   Title  Patient will increase right shoulder flexion to >/= 135 degrees for increased ease reaching overhead.    Baseline  135 pre-op; 123 post op    Time  4    Period  Weeks    Status  New    Target Date  05/14/20      PT LONG TERM GOAL #3   Title  Patient will increase right shoulder abduction to >/= 150 degrees for increased ease reaching overhead.    Baseline  133 post op; 152 pre-op    Time  4    Period  Weeks    Status  New    Target Date  05/14/20      PT LONG TERM GOAL #4   Title  Patient will report >/= 50% decrease in right  chest edema for increase comfort.    Time  4    Period  Weeks    Status  New    Target Date  05/14/20      PT LONG TERM GOAL #5   Title  Patient will verbalize good understanding of risk reduction practices for lymphedema.    Time  4    Period  Weeks    Status  New    Target Date  05/14/20            Plan - 04/19/20 0930    Clinical Impression Statement  Pt continues with soft edema in her R lateral upper quadrant below her axilla that softened as demonstrated by crepeing of the skin near incision following MLD. Pt was taught self MLD for the R anterior trunk this session modified to just clear anastoosis and nodes with deep diaphragmatic breathing at this time. Pt had good return demonstration with an emphasis on skin stretch, direction and pressure needed to move fluid. P/ROM was performed with light end range stretch as pt was able to tolerat this session; slight stretch repoted at the R pectoralis major with stretching into flexion. Pt tolerated treatment well. Pt will benefit from continued POC at this time.    Rehab Potential  Excellent    PT Frequency  2x / week    PT Duration  4 weeks    PT Treatment/Interventions  ADLs/Self Care Home Management;Therapeutic exercise;Patient/family education;Manual techniques;Manual lymph drainage;Passive range of motion;Scar mobilization    PT Next Visit Plan  assess self MLD; continue with PROM right shoulder to increase shoulder flexion and abduction. Increase exercises for home fot the R shoulder    PT Home Exercise Plan  Post op shoulder ROM HEP    Consulted and Agree with Plan of Care  Patient       Patient will benefit from skilled therapeutic intervention in order to improve the following deficits and impairments:  Postural dysfunction, Decreased range of motion, Impaired UE functional use, Pain, Decreased knowledge of precautions, Increased fascial restricitons, Increased edema  Visit Diagnosis: Malignant neoplasm of upper-outer  quadrant of right breast in female, estrogen receptor positive ([facility])  Abnormal posture  Aftercare following surgery for neoplasm  Localized edema  Stiffness of right shoulder, not elsewhere classified     Problem List Patient Active Problem List   Diagnosis Date Noted  . Genetic testing 02/03/2020  . Family history of breast cancer   . Family history of skin cancer   . Family history of brain cancer   . Family history of lung cancer   . Family history of multiple myeloma   . Malignant neoplasm of upper-outer quadrant of right breast in female, estrogen receptor positive ([place]) 01/04/2020  . Melanoma of skin ([place]) 08/15/2019  . 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 Failed total hip arthroplasty ([place]) 07/18/2019  . Cough 05/31/2014  . GERD (gastroesophageal reflux disease) 09/13/2012  . HYPOTHYROIDISM 04/16/2009  . Essential hypertension 04/16/2009  . Osteoarthritis 04/16/2009    Ander Purpura, PT 04/19/2020, 10:05 AM  [place], [place], [address] Phone: [phone]   Fax:  [phone]  Name: ABREY BRADWAY MRN: [id number] Date of Birth: 07/30/1951

## 2020-04-19 NOTE — Patient Instructions (Signed)
Self manual lymph drainage: Perform this sequence once a day.  Only give enough pressure no your skin to make the skin move.  Diaphragmatic - Supine   Inhale through nose making navel move out toward hands. Exhale through puckered lips, hands follow navel in. Repeat _5__ times. Rest _10__ seconds between repeats.   Copyright  VHI. All rights reserved.  Hug yourself.  Do circles at your neck just above your collarbones.  Repeat this 10 times.  Axilla - One at a Time   Using full weight of flat hand and fingers at center of both armpit, make _10__ in-place circles.   Copyright  VHI. All rights reserved.  LEG: Inguinal Nodes Stimulation   With small finger side of hand against hip crease on involved side, gently perform circles at the crease. Repeat __10_ times.   Copyright  VHI. All rights reserved.  1) Axilla to Inguinal Nodes - Sweep   On involved side, sweep _10__ times from armpit along side of trunk to hip crease.  Now gently stretch skin from the involved side to the uninvolved side across the chest at the shoulder line.  Repeat that 5 times.   Finish by stimulating the nodes then doing the pathways as described above going from your involved armpit to the same side groin and going across your upper chest from the involved shoulder to the uninvolved shoulder.  Repeat 5 diaphragmatic breathes Copyright  VHI. All rights reserved.

## 2020-04-23 NOTE — Progress Notes (Signed)
[facility]  [phone] [phone]     ID: Theresa Bradley DOB: [birth year]  MR#: [id number]  YKZ#:[id number]  Patient Care Team: [name], MD as PCP - General [name], [name], MD as Consulting Physician (Allergy and Immunology) [name], OD as Referring Physician [name], RN as Oncology Nurse Navigator [name], RN as Oncology Nurse Navigator [name], MD as Consulting Physician (General Surgery) Loralye Loberg, [name], MD as Consulting Physician (Oncology) [name], MD as Attending Physician (Radiation Oncology) [name], MD as Referring Physician (Dermatology) Chauncey Cruel, MD OTHER MD:  CHIEF COMPLAINT: triple positive breast cancer (s/p right mastectomy)  CURRENT TREATMENT: weekly paclitaxel x12; trastuzumab to complete a year   INTERVAL HISTORY: Theresa Bradley" returns today for follow up and treatment of her triple positive breast cancer accompanied by her husband [place].   She is scheduled to begin weekly paclitaxel and trastuzumab today.  Her echocardiogram 01/17/2020 showed an ejection fraction in the 60-65% range.  We are using her lab work from 04/02/2020 since she has had no intercurrent treatment   REVIEW OF SYSTEMS: Theresa Bradley did a lot of housework over the weekend because she says she was not sure how the chemo would make her feel and she was anxious.  They also got to see grandchildren.  She has no baseline neuropathy.  She has already ordered a wig which she hopes will match her current hair color.  She has a compression bra which she uses with a prosthesis but she does not have her final bra or prosthesis yet.  A detailed review of systems today was otherwise noncontributory   HISTORY OF CURRENT ILLNESS: From the original intake note:   Theresa Bradley was my patient a little over 11 years ago when she underwent lumpectomy on 05/15/2008 for ductal carcinoma in situ and  received radiation therapy under Dr. Valere Dross. 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  More recently, she presented to her PCP with a small palpable right breast lump around 11 o'clock, just superior and lateral to the nipple. She underwent bilateral diagnostic mammography with tomography and right breast ultrasonography at [facility] on 12/26/2019 showing: breast density category B; 6 mm superficial mass in the right breast at 11 o'clock in the retroareolar region; no enlarged adenopathy in right axilla.  Accordingly on 12/30/2019 she proceeded to biopsy of the right breast area in question. The pathology from this procedure ([id number]) showed: invasive ductal carcinoma, grade 2. Prognostic indicators significant for: estrogen receptor, 95% positive and progesterone receptor, 80% positive, both with strong staining intensity. Proliferation marker Ki67 at 20%. HER2 positive by immunohistochemistry (3+).  The patient's subsequent history is as detailed below.   PAST MEDICAL HISTORY: Past Medical History:  Diagnosis Date  . Anxiety   . Breast cancer ([facility]) 2009   right  . Cancer ([facility])   . COUGH, CHRONIC 05/10/2010  . Dysrhythmia    occationally "skips a beat"  . Family history of brain cancer   . Family history of breast cancer   . Family history of lung cancer   . Family history of multiple myeloma   . Family history of skin cancer   . GERD (gastroesophageal reflux disease)   . HYPERTENSION 04/16/2009  . HYPOTHYROIDISM 04/16/2009  . OSTEOARTHRITIS 04/16/2009  . Personal history of radiation therapy   .  SENILE LENTIGO 06/10/2010    PAST SURGICAL HISTORY: Past Surgical History:  Procedure Laterality Date  . BREAST EXCISIONAL BIOPSY Right   . BREAST EXCISIONAL BIOPSY Left   . BREAST LUMPECTOMY Right 2009   radiation only  . BREAST LUMPECTOMY WITH RADIOACTIVE SEED AND SENTINEL LYMPH  NODE BIOPSY Right 01/31/2020   Procedure: RIGHT BREAST LUMPECTOMY WITH RADIOACTIVE SEED AND SENTINEL LYMPH NODE BIOPSY;  Surgeon: [name], MD;  Location: [facility];  Service: General;  Laterality: Right;  PEC BLOCK  . BREAST SURGERY  2009   X 2, cancer stage 0, lumpectomy  . [place]  . CHOLECYSTECTOMY    . DILATATION & CURETTAGE/HYSTEROSCOPY WITH MYOSURE  11/2015  . HERNIA REPAIR  2009  . JOINT REPLACEMENT  2008   hip  . OPEN SURGICAL REPAIR OF GLUTEAL TENDON Left 07/18/2019   Procedure: Left hip bearing surface revision with gluteal tendon repair;  Surgeon: Gaynelle Arabian, MD;  Location: [facility];  Service: Orthopedics;  Laterality: Left;  2 hrs  . PORTACATH PLACEMENT N/A 03/15/2020   Procedure: INSERTION PORT-A-CATH WITH ULTRASOUND GUIDANCE;  Surgeon: [name], MD;  Location: [facility];  Service: General;  Laterality: N/A;  . SIMPLE MASTECTOMY WITH AXILLARY SENTINEL NODE BIOPSY Right 03/15/2020   Procedure: RIGHT SIMPLE MASTECTOMY;  Surgeon: [name], MD;  Location: [facility];  Service: General;  Laterality: Right;    FAMILY HISTORY: Family History  Problem Relation Age of Onset  . Arthritis Other   . Hypertension Other   . Cancer Other        2 aunts  . Hyperlipidemia Father   . Dementia Father   . Skin cancer Father   . Skin cancer Mother   . Breast cancer Maternal Aunt 63  . Breast cancer Maternal Aunt        dx. mid-70s  . Lung cancer Paternal Uncle   . Multiple myeloma Paternal Uncle   . Cancer Paternal Aunt        unknown type, dx. >50  . Brain cancer Cousin        dx. 60s, paternal cousin   The patient's father is 48 years old and the patient's mother is 23 years old as of February 2021.  The patient has one brother, no sisters.  A maternal aunt was diagnosed with breast cancer at age 42.  A paternal uncle was diagnosed with lung cancer in his 24s and another paternal uncle with a  "blood cancer" in his late 34s.  There is no history of ovarian pancreatic or prostate cancer in the family to her knowledge.   GYNECOLOGIC HISTORY:  No LMP recorded. Patient is postmenopausal. Menarche: 69 years old Age at first live birth: 69 years old [place] P 2 LMP HRT no  Hysterectomy?  No BSO?  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  Her husband Patrick Jupiter runs and environmental company that for example checks for mold and then remediate.  Son Delfino Lovett, 52 years old, lives in [place] and works as a Clinical biochemist.  He has 2 sons.  The patient's son Catalina Antigua 91 lives in [place] and works in Chief Executive Officer.  He has no children.  The patient is Episcopalian    ADVANCED DIRECTIVES: In the absence of any documentation to the contrary, the patient's spouse is their HCPOA.  HEALTH MAINTENANCE: Social History   Tobacco Use  . Smoking status: Never Smoker  . Smokeless tobacco: Never Used  . Tobacco comment: father smoked when she was younger   Substance Use Topics  . Alcohol use: Yes    Comment: occasionally   . Drug use: No     Colonoscopy: 03/2009/[facility]  PAP: 10/2013, negative  Bone density: 11/2015, T score -1.7   Allergies  Allergen Reactions  . Codeine Sulfate Nausea And Vomiting  . Erythromycin Base Other (See Comments)    Stomach ache  . Esomeprazole Magnesium     REACTION: GI upset    Current Outpatient Medications  Medication Sig Dispense Refill  . ALPRAZolam (XANAX) 0.25 MG tablet TAKE 1/2 TABLET BY MOUTH AT BEDTIME AS NEEDED INSOMNIA 45 tablet 0  . amLODipine (NORVASC) 5 MG tablet Take 1 tablet (5 mg total) by mouth daily. 90 tablet 3  . BIOTIN 5000 PO Take by mouth.    . Calcium Carbonate-Vitamin D 600-200 MG-UNIT TABS Take 1 tablet by mouth daily.    . citalopram (CELEXA) 20 MG tablet Take 1 tablet (20 mg total) by mouth daily. 90 tablet 3  .  docusate sodium (COLACE) 100 MG capsule Take 100 mg by mouth daily as needed for mild constipation.     Marland Kitchen EPIPEN 2-PAK 0.3 MG/0.3ML SOAJ injection Inject 0.3 mg into the muscle as needed for anaphylaxis.     . famotidine (PEPCID) 10 MG tablet Take 10 mg by mouth daily.     Marland Kitchen levothyroxine (SYNTHROID) 50 MCG tablet TAKE 1 TABLET BY MOUTH EVERY DAY 90 tablet 1  . lidocaine-prilocaine (EMLA) cream Apply to affected area once 30 g 3  . meloxicam (MOBIC) 15 MG tablet TAKE 1 TABLET BY MOUTH EVERY DAY 90 tablet 1  . mupirocin ointment (BACTROBAN) 2 % APPLY TO AFFECTED AREA EVERY DAY    . oxyCODONE (OXY IR/ROXICODONE) 5 MG immediate release tablet Take 1 tablet (5 mg total) by mouth every 6 (six) hours as needed for severe pain. 15 tablet 0  . prochlorperazine (COMPAZINE) 10 MG tablet Take 1 tablet (10 mg total) by mouth every 6 (six) hours as needed (Nausea or vomiting). 30 tablet 1  . tretinoin (RETIN-A) 0.025 % cream Apply topically at bedtime. 45 g 6   No current facility-administered medications for this visit.    OBJECTIVE: white woman in no acute distress  Vitals:   04/24/20 0844  BP: (!) 150/58  Pulse: (!) 51  Resp: 18  Temp: 98 F (36.7 C)  SpO2: 99%     Body mass index is 26.66 kg/m.   Wt Readings from Last 3 Encounters:  04/24/20 155 lb 4.8 oz (70.4 kg)  04/02/20 155 lb (70.3 kg)  03/15/20 157 lb 10.1 oz (71.5 kg)      ECOG FS:1 - Symptomatic but completely ambulatory  Sclerae unicteric, EOMs intact Wearing a mask No cervical or supraclavicular adenopathy Lungs no rales or rhonchi Heart regular rate and rhythm Abd soft, nontender, positive bowel sounds MSK no focal spinal tenderness, no upper extremity lymphedema Neuro: nonfocal, well oriented, appropriate affect Breasts: The right breast is status post mastectomy.  The incision is healing nicely.  There is some fluid underneath but no erythema or dehiscence.   LAB RESULTS:  CMP     Component Value Date/Time   NA  140 04/02/2020 0916   K 3.5 04/02/2020 0916   CL 102 04/02/2020 0916   CO2 29 04/02/2020 0916   GLUCOSE  137 (H) 04/02/2020 0916   BUN 8 04/02/2020 0916   CREATININE 0.85 04/02/2020 0916   CREATININE 0.81 01/11/2020 1209   CALCIUM 9.2 04/02/2020 0916   PROT 7.1 04/02/2020 0916   ALBUMIN 3.6 04/02/2020 0916   AST 15 04/02/2020 0916   AST 14 (L) 01/11/2020 1209   ALT 15 04/02/2020 0916   ALT 13 01/11/2020 1209   ALKPHOS 73 04/02/2020 0916   BILITOT 0.5 04/02/2020 0916   BILITOT 0.5 01/11/2020 1209   GFRNONAA >60 04/02/2020 0916   GFRNONAA >60 01/11/2020 1209   GFRAA >60 04/02/2020 0916   GFRAA >60 01/11/2020 1209    No results found for: TOTALPROTELP, ALBUMINELP, A1GS, A2GS, BETS, BETA2SER, GAMS, MSPIKE, SPEI  Lab Results  Component Value Date   WBC 6.1 04/02/2020   NEUTROABS 3.2 04/02/2020   HGB 13.3 04/02/2020   HCT 41.1 04/02/2020   MCV 89.7 04/02/2020   PLT 281 04/02/2020    No results found for: LABCA2  No components found for: [id number]  No results for input(s): INR in the last 168 hours.  No results found for: LABCA2  No results found for: YBW389  No results found for: HTD428  No results found for: JGO115  No results found for: CA2729  No components found for: HGQUANT  No results found for: CEA1 / No results found for: CEA1   No results found for: AFPTUMOR  No results found for: CHROMOGRNA  No results found for: KPAFRELGTCHN, LAMBDASER, KAPLAMBRATIO (kappa/lambda light chains)  No results found for: HGBA, HGBA2QUANT, HGBFQUANT, HGBSQUAN (Hemoglobinopathy evaluation)   No results found for: LDH  No results found for: IRON, TIBC, IRONPCTSAT (Iron and TIBC)  No results found for: FERRITIN  Urinalysis    Component Value Date/Time   COLORURINE yellow 05/24/2010 0855   APPEARANCEUR Clear 05/24/2010 0855   LABSPEC 1.015 05/24/2010 0855   PHURINE 6.0 05/24/2010 0855   GLUCOSEU NEGATIVE 09/22/2007 1110   HGBUR negative 05/24/2010 0855    BILIRUBINUR n 06/29/2019 1533   KETONESUR NEGATIVE 09/22/2007 1110   PROTEINUR Negative 06/29/2019 [place] 09/22/2007 1110   UROBILINOGEN 0.2 06/29/2019 1533   UROBILINOGEN 0.2 05/24/2010 0855   NITRITE n 06/29/2019 1533   NITRITE negative 05/24/2010 0855   LEUKOCYTESUR Negative 06/29/2019 1533     STUDIES: No results found.   ELIGIBLE FOR AVAILABLE RESEARCH PROTOCOL: no  ASSESSMENT: 69 y.o. [place] woman status post right breast periareolar biopsy 12/30/2019 for a clinical T1a N0, stage I invasive ductal carcinoma, grade 2, estrogen and progesterone receptor positive, HER-2 amplified, with an MIB-one of 20%.  (1) history of prior right lumpectomy June 2009 for ductal carcinoma in situ  (a) status post adjuvant radiation  (b) did not receive antiestrogens  (2) status post repeat right lumpectomy and sentinel lymph node sampling 01/31/2020 for a pT1c pN0, stage IA invasive ductal carcinoma, grade 2, with positive margins  (a) total one sentinel node removed  (3) s/p right mastectomy 03/15/2020 showing residual invasive ductal carcinoma measuring 0.9 cm, but negative margins (added to prior 1.1 lumpectomy, still T1 N0 or stage IA)  (a) one additional axillary node was removed (total 2 right axillary nodes removed)  (3) adjuvant chemotherapy with paclitaxel and trastuzumab weekly x 12 to start 04/17/2020  (4) trastuzumab to be continued every 21 days to total one year  (a) echo 01/17/2020 shows an ejection fraction in the 60-65% range  (5) to start antiestrogens at the completion of local treatment.  (6) genetics testing  02/01/2020 through the [facility] Breast [facility] STAT panel found no deleterious mutations then ATM, BRCA1, BRCA2, CDH1, CHEK2, PALB2, PTEN, STK11 and TP53.    PLAN: Arleny is ready to proceed with chemo today.  We again discussed the possible toxicities side effects and complications of paclitaxel and of course she is already prepared for the  hair loss.  We discussed Compazine in detail and she will take 1 dose tonight before supper and 1 tomorrow morning before breakfast.  After that she will take it only as needed.  I alerted her to the first dose reactions to Taxol which hopefully will not occur but if they do occur they are transient.  If she develops bony aches she is already on Mobic and she will add Tylenol.  If that is not sufficient she will let us know.  We reviewed her echocardiogram which is very favorable.  We also talked about neuropathy symptoms and the fact that in some cases some sensation loss in the finger pads may be permanent.  I asked her to keep a symptom diary and I will see her a week from today with dose #2 in the treatment area adjust to troubleshoot any problems that may develop.  Total encounter time 30 minutes.Chauncey Cruel, MD   04/24/2020 9:12 AM Medical Oncology and Hematology [facility] [place], [place] [address] Tel. [phone]    Fax. [phone]   This document serves as a record of services personally performed by Lurline Del, MD. It was created on his behalf by Wilburn Mylar, a trained medical scribe. The creation of this record is based on the scribe's personal observations and the provider's statements to them.   I, Lurline Del MD, have reviewed the above documentation for accuracy and completeness, and I agree with the above.   *Total Encounter Time as defined by the Centers for Medicare and Medicaid Services includes, in addition to the face-to-face time of a patient visit (documented in the note above) non-face-to-face time: obtaining and reviewing outside history, ordering and reviewing medications, tests or procedures, care coordination (communications with other health care professionals or caregivers) and documentation in the medical record.

## 2020-04-24 ENCOUNTER — Inpatient Hospital Stay: Payer: Medicare Other

## 2020-04-24 ENCOUNTER — Inpatient Hospital Stay: Payer: Medicare Other | Attending: Oncology

## 2020-04-24 ENCOUNTER — Encounter: Payer: Self-pay | Admitting: *Deleted

## 2020-04-24 ENCOUNTER — Other Ambulatory Visit: Payer: Self-pay

## 2020-04-24 ENCOUNTER — Inpatient Hospital Stay: Payer: Medicare Other | Admitting: Oncology

## 2020-04-24 VITALS — BP 148/66 | HR 58 | Temp 98.1°F | Resp 17

## 2020-04-24 VITALS — BP 150/58 | HR 51 | Temp 98.0°F | Resp 18 | Ht 64.0 in | Wt 155.3 lb

## 2020-04-24 DIAGNOSIS — Z808 Family history of malignant neoplasm of other organs or systems: Secondary | ICD-10-CM | POA: Insufficient documentation

## 2020-04-24 DIAGNOSIS — Z170 Estrogen receptor positive status [ER+]: Secondary | ICD-10-CM

## 2020-04-24 DIAGNOSIS — C50111 Malignant neoplasm of central portion of right female breast: Secondary | ICD-10-CM | POA: Diagnosis not present

## 2020-04-24 DIAGNOSIS — Z801 Family history of malignant neoplasm of trachea, bronchus and lung: Secondary | ICD-10-CM | POA: Insufficient documentation

## 2020-04-24 DIAGNOSIS — Z9011 Acquired absence of right breast and nipple: Secondary | ICD-10-CM | POA: Diagnosis not present

## 2020-04-24 DIAGNOSIS — R5383 Other fatigue: Secondary | ICD-10-CM | POA: Insufficient documentation

## 2020-04-24 DIAGNOSIS — Z86006 Personal history of melanoma in-situ: Secondary | ICD-10-CM | POA: Diagnosis not present

## 2020-04-24 DIAGNOSIS — C50411 Malignant neoplasm of upper-outer quadrant of right female breast: Secondary | ICD-10-CM

## 2020-04-24 DIAGNOSIS — Z780 Asymptomatic menopausal state: Secondary | ICD-10-CM | POA: Diagnosis not present

## 2020-04-24 DIAGNOSIS — Z923 Personal history of irradiation: Secondary | ICD-10-CM | POA: Diagnosis not present

## 2020-04-24 DIAGNOSIS — R040 Epistaxis: Secondary | ICD-10-CM | POA: Diagnosis not present

## 2020-04-24 DIAGNOSIS — Z86000 Personal history of in-situ neoplasm of breast: Secondary | ICD-10-CM | POA: Diagnosis not present

## 2020-04-24 DIAGNOSIS — R152 Fecal urgency: Secondary | ICD-10-CM | POA: Diagnosis not present

## 2020-04-24 DIAGNOSIS — Z803 Family history of malignant neoplasm of breast: Secondary | ICD-10-CM | POA: Diagnosis not present

## 2020-04-24 DIAGNOSIS — L738 Other specified follicular disorders: Secondary | ICD-10-CM | POA: Diagnosis not present

## 2020-04-24 DIAGNOSIS — Z5111 Encounter for antineoplastic chemotherapy: Secondary | ICD-10-CM | POA: Diagnosis not present

## 2020-04-24 MED ORDER — ACETAMINOPHEN 325 MG PO TABS
ORAL_TABLET | ORAL | Status: AC
  Filled 2020-04-24: qty 2

## 2020-04-24 MED ORDER — TRASTUZUMAB-DKST CHEMO 150 MG IV SOLR
300.0000 mg | Freq: Once | INTRAVENOUS | Status: AC
  Administered 2020-04-24: 300 mg via INTRAVENOUS
  Filled 2020-04-24: qty 14.29

## 2020-04-24 MED ORDER — DIPHENHYDRAMINE HCL 50 MG/ML IJ SOLN
INTRAMUSCULAR | Status: AC
  Filled 2020-04-24: qty 1

## 2020-04-24 MED ORDER — SODIUM CHLORIDE 0.9 % IV SOLN
20.0000 mg | Freq: Once | INTRAVENOUS | Status: AC
  Administered 2020-04-24: 20 mg via INTRAVENOUS
  Filled 2020-04-24: qty 20

## 2020-04-24 MED ORDER — SODIUM CHLORIDE 0.9 % IV SOLN
80.0000 mg/m2 | Freq: Once | INTRAVENOUS | Status: AC
  Administered 2020-04-24: 144 mg via INTRAVENOUS
  Filled 2020-04-24: qty 24

## 2020-04-24 MED ORDER — ACETAMINOPHEN 325 MG PO TABS
650.0000 mg | ORAL_TABLET | Freq: Once | ORAL | Status: AC
  Administered 2020-04-24: 650 mg via ORAL

## 2020-04-24 MED ORDER — SODIUM CHLORIDE 0.9 % IV SOLN
Freq: Once | INTRAVENOUS | Status: AC
  Filled 2020-04-24: qty 250

## 2020-04-24 MED ORDER — DIPHENHYDRAMINE HCL 50 MG/ML IJ SOLN
25.0000 mg | Freq: Once | INTRAMUSCULAR | Status: AC
  Administered 2020-04-24: 25 mg via INTRAVENOUS

## 2020-04-24 MED ORDER — FAMOTIDINE IN NACL 20-0.9 MG/50ML-% IV SOLN
INTRAVENOUS | Status: AC
  Filled 2020-04-24: qty 50

## 2020-04-24 MED ORDER — FAMOTIDINE IN NACL 20-0.9 MG/50ML-% IV SOLN
20.0000 mg | Freq: Once | INTRAVENOUS | Status: AC
  Administered 2020-04-24: 20 mg via INTRAVENOUS

## 2020-04-24 MED ORDER — SODIUM CHLORIDE 0.9% FLUSH
10.0000 mL | INTRAVENOUS | Status: DC | PRN
  Administered 2020-04-24: 10 mL
  Filled 2020-04-24: qty 10

## 2020-04-24 MED ORDER — HEPARIN SOD (PORK) LOCK FLUSH 100 UNIT/ML IV SOLN
500.0000 [IU] | Freq: Once | INTRAVENOUS | Status: AC | PRN
  Administered 2020-04-24: 500 [IU]
  Filled 2020-04-24: qty 5

## 2020-04-24 NOTE — Progress Notes (Signed)
Per Dr. Jana Hakim, using labs from 04/02/20 for today's treatment.

## 2020-04-24 NOTE — Progress Notes (Signed)
.   Pharmacist Chemotherapy Monitoring - Initial Assessment    Anticipated start date: 04/24/20   Regimen:  . Are orders appropriate based on the patient's diagnosis, regimen, and cycle? Yes . Does the plan date match the patient's scheduled date? Yes . Is the sequencing of drugs appropriate? Yes . Are the premedications appropriate for the patient's regimen? Yes . Prior Authorization for treatment is: Approved o If applicable, is the correct biosimilar selected based on the patient's insurance? yes  Organ Function and Labs: Marland Kitchen Are dose adjustments needed based on the patient's renal function, hepatic function, or hematologic function? No . Are appropriate labs ordered prior to the start of patient's treatment? Yes . Other organ system assessment, if indicated: trastuzumab: Echo/ MUGA and trastuzumab-deruztecan: Echo/ MUGA . The following baseline labs, if indicated, have been ordered: N/A  Dose Assessment: . Are the drug doses appropriate? Yes . Are the following correct: o Drug concentrations Yes o IV fluid compatible with drug Yes o Administration routes Yes o Timing of therapy Yes . If applicable, does the patient have documented access for treatment and/or plans for port-a-cath placement? yes . If applicable, have lifetime cumulative doses been properly documented and assessed? not applicable Lifetime Dose Tracking  No doses have been documented on this patient for the following tracked chemicals: Doxorubicin, Epirubicin, Idarubicin, Daunorubicin, Mitoxantrone, Bleomycin, Oxaliplatin, Carboplatin, Liposomal Doxorubicin  o   Toxicity Monitoring/Prevention: . The patient has the following take home antiemetics prescribed: Prochlorperazine . The patient has the following take home medications prescribed: N/A . Medication allergies and previous infusion related reactions, if applicable, have been reviewed and addressed. Yes . The patient's current medication list has been assessed  for drug-drug interactions with their chemotherapy regimen. no significant drug-drug interactions were identified on review.  Order Review: . Are the treatment plan orders signed? Yes . Is the patient scheduled to see a provider prior to their treatment? No  I verify that I have reviewed each item in the above checklist and answered each question accordingly.  Wynona Neat 04/24/2020 10:30 AM

## 2020-04-24 NOTE — Patient Instructions (Signed)
[facility] Discharge Instructions for Patients Receiving Chemotherapy  Today you received the following chemotherapy agents: Trastuzumab, Taxol  To help prevent nausea and vomiting after your treatment, we encourage you to take your nausea medication as directed.    If you develop nausea and vomiting that is not controlled by your nausea medication, call the clinic.   BELOW ARE SYMPTOMS THAT SHOULD BE REPORTED IMMEDIATELY:  *FEVER GREATER THAN 100.5 F  *CHILLS WITH OR WITHOUT FEVER  NAUSEA AND VOMITING THAT IS NOT CONTROLLED WITH YOUR NAUSEA MEDICATION  *UNUSUAL SHORTNESS OF BREATH  *UNUSUAL BRUISING OR BLEEDING  TENDERNESS IN MOUTH AND THROAT WITH OR WITHOUT PRESENCE OF ULCERS  *URINARY PROBLEMS  *BOWEL PROBLEMS  UNUSUAL RASH Items with * indicate a potential emergency and should be followed up as soon as possible.  Feel free to call the clinic should you have any questions or concerns. The clinic phone number is [phone].  Please show the CHEMO ALERT CARD at check-in to the Emergency Department and triage nurse.  Trastuzumab injection for infusion What is this medicine? TRASTUZUMAB (tras TOO zoo mab) is a monoclonal antibody. It is used to treat breast cancer and stomach cancer. This medicine may be used for other purposes; ask your health care provider or pharmacist if you have questions. COMMON BRAND NAME(S): Herceptin, Herzuma, KANJINTI, Ogivri, Ontruzant, Trazimera What should I tell my health care provider before I take this medicine? They need to know if you have any of these conditions:  heart disease  heart failure  lung or breathing disease, like asthma  an unusual or allergic reaction to trastuzumab, benzyl alcohol, or other medications, foods, dyes, or preservatives  pregnant or trying to get pregnant  breast-feeding How should I use this medicine? This drug is given as an infusion into a vein. It is administered in a hospital or  clinic by a specially trained health care professional. Talk to your pediatrician regarding the use of this medicine in children. This medicine is not approved for use in children. Overdosage: If you think you have taken too much of this medicine contact a poison control center or emergency room at once. NOTE: This medicine is only for you. Do not share this medicine with others. What if I miss a dose? It is important not to miss a dose. Call your doctor or health care professional if you are unable to keep an appointment. What may interact with this medicine? This medicine may interact with the following medications:  certain types of chemotherapy, such as daunorubicin, doxorubicin, epirubicin, and idarubicin This list may not describe all possible interactions. Give your health care provider a list of all the medicines, herbs, non-prescription drugs, or dietary supplements you use. Also tell them if you smoke, drink alcohol, or use illegal drugs. Some items may interact with your medicine. What should I watch for while using this medicine? Visit your doctor for checks on your progress. Report any side effects. Continue your course of treatment even though you feel ill unless your doctor tells you to stop. Call your doctor or health care professional for advice if you get a fever, chills or sore throat, or other symptoms of a cold or flu. Do not treat yourself. Try to avoid being around people who are sick. You may experience fever, chills and shaking during your first infusion. These effects are usually mild and can be treated with other medicines. Report any side effects during the infusion to your health care professional. Fever   and chills usually do not happen with later infusions. Do not become pregnant while taking this medicine or for 7 months after stopping it. Women should inform their doctor if they wish to become pregnant or think they might be pregnant. Women of child-bearing potential  will need to have a negative pregnancy test before starting this medicine. There is a potential for serious side effects to an unborn child. Talk to your health care professional or pharmacist for more information. Do not breast-feed an infant while taking this medicine or for 7 months after stopping it. Women must use effective birth control with this medicine. What side effects may I notice from receiving this medicine? Side effects that you should report to your doctor or health care professional as soon as possible:  allergic reactions like skin rash, itching or hives, swelling of the face, lips, or tongue  chest pain or palpitations  cough  dizziness  feeling faint or lightheaded, falls  fever  general ill feeling or flu-like symptoms  signs of worsening heart failure like breathing problems; swelling in your legs and feet  unusually weak or tired Side effects that usually do not require medical attention (report to your doctor or health care professional if they continue or are bothersome):  bone pain  changes in taste  diarrhea  joint pain  nausea/vomiting  weight loss This list may not describe all possible side effects. Call your doctor for medical advice about side effects. You may report side effects to FDA at 1-800-FDA-1088. Where should I keep my medicine? This drug is given in a hospital or clinic and will not be stored at home. NOTE: This sheet is a summary. It may not cover all possible information. If you have questions about this medicine, talk to your doctor, pharmacist, or health care provider.  2020 Elsevier/Gold Standard (2016-11-04 14:37:52)  Paclitaxel injection What is this medicine? PACLITAXEL (PAK li TAX el) is a chemotherapy drug. It targets fast dividing cells, like cancer cells, and causes these cells to die. This medicine is used to treat ovarian cancer, breast cancer, lung cancer, Kaposi's sarcoma, and other cancers. This medicine may be  used for other purposes; ask your health care provider or pharmacist if you have questions. COMMON BRAND NAME(S): Onxol, Taxol What should I tell my health care provider before I take this medicine? They need to know if you have any of these conditions:  history of irregular heartbeat  liver disease  low blood counts, like low white cell, platelet, or red cell counts  lung or breathing disease, like asthma  tingling of the fingers or toes, or other nerve disorder  an unusual or allergic reaction to paclitaxel, alcohol, polyoxyethylated castor oil, other chemotherapy, other medicines, foods, dyes, or preservatives  pregnant or trying to get pregnant  breast-feeding How should I use this medicine? This drug is given as an infusion into a vein. It is administered in a hospital or clinic by a specially trained health care professional. Talk to your pediatrician regarding the use of this medicine in children. Special care may be needed. Overdosage: If you think you have taken too much of this medicine contact a poison control center or emergency room at once. NOTE: This medicine is only for you. Do not share this medicine with others. What if I miss a dose? It is important not to miss your dose. Call your doctor or health care professional if you are unable to keep an appointment. What may interact with this medicine?   Do not take this medicine with any of the following medications:  disulfiram  metronidazole This medicine may also interact with the following medications:  antiviral medicines for hepatitis, HIV or AIDS  certain antibiotics like erythromycin and clarithromycin  certain medicines for fungal infections like ketoconazole and itraconazole  certain medicines for seizures like carbamazepine, phenobarbital, phenytoin  gemfibrozil  nefazodone  rifampin  St. John's wort This list may not describe all possible interactions. Give your health care provider a list of  all the medicines, herbs, non-prescription drugs, or dietary supplements you use. Also tell them if you smoke, drink alcohol, or use illegal drugs. Some items may interact with your medicine. What should I watch for while using this medicine? Your condition will be monitored carefully while you are receiving this medicine. You will need important blood work done while you are taking this medicine. This medicine can cause serious allergic reactions. To reduce your risk you will need to take other medicine(s) before treatment with this medicine. If you experience allergic reactions like skin rash, itching or hives, swelling of the face, lips, or tongue, tell your doctor or health care professional right away. In some cases, you may be given additional medicines to help with side effects. Follow all directions for their use. This drug may make you feel generally unwell. This is not uncommon, as chemotherapy can affect healthy cells as well as cancer cells. Report any side effects. Continue your course of treatment even though you feel ill unless your doctor tells you to stop. Call your doctor or health care professional for advice if you get a fever, chills or sore throat, or other symptoms of a cold or flu. Do not treat yourself. This drug decreases your body's ability to fight infections. Try to avoid being around people who are sick. This medicine may increase your risk to bruise or bleed. Call your doctor or health care professional if you notice any unusual bleeding. Be careful brushing and flossing your teeth or using a toothpick because you may get an infection or bleed more easily. If you have any dental work done, tell your dentist you are receiving this medicine. Avoid taking products that contain aspirin, acetaminophen, ibuprofen, naproxen, or ketoprofen unless instructed by your doctor. These medicines may hide a fever. Do not become pregnant while taking this medicine. Women should inform their  doctor if they wish to become pregnant or think they might be pregnant. There is a potential for serious side effects to an unborn child. Talk to your health care professional or pharmacist for more information. Do not breast-feed an infant while taking this medicine. Men are advised not to father a child while receiving this medicine. This product may contain alcohol. Ask your pharmacist or healthcare provider if this medicine contains alcohol. Be sure to tell all healthcare providers you are taking this medicine. Certain medicines, like metronidazole and disulfiram, can cause an unpleasant reaction when taken with alcohol. The reaction includes flushing, headache, nausea, vomiting, sweating, and increased thirst. The reaction can last from 30 minutes to several hours. What side effects may I notice from receiving this medicine? Side effects that you should report to your doctor or health care professional as soon as possible:  allergic reactions like skin rash, itching or hives, swelling of the face, lips, or tongue  breathing problems  changes in vision  fast, irregular heartbeat  high or low blood pressure  mouth sores  pain, tingling, numbness in the hands or feet  signs   of decreased platelets or bleeding - bruising, pinpoint red spots on the skin, black, tarry stools, blood in the urine  signs of decreased red blood cells - unusually weak or tired, feeling faint or lightheaded, falls  signs of infection - fever or chills, cough, sore throat, pain or difficulty passing urine  signs and symptoms of liver injury like dark yellow or brown urine; general ill feeling or flu-like symptoms; light-colored stools; loss of appetite; nausea; right upper belly pain; unusually weak or tired; yellowing of the eyes or skin  swelling of the ankles, feet, hands  unusually slow heartbeat Side effects that usually do not require medical attention (report to your doctor or health care professional if  they continue or are bothersome):  diarrhea  hair loss  loss of appetite  muscle or joint pain  nausea, vomiting  pain, redness, or irritation at site where injected  tiredness This list may not describe all possible side effects. Call your doctor for medical advice about side effects. You may report side effects to FDA at 1-800-FDA-1088. Where should I keep my medicine? This drug is given in a hospital or clinic and will not be stored at home. NOTE: This sheet is a summary. It may not cover all possible information. If you have questions about this medicine, talk to your doctor, pharmacist, or health care provider.  2020 Elsevier/Gold Standard (2017-07-14 13:14:55)    

## 2020-04-25 ENCOUNTER — Ambulatory Visit: Payer: Medicare Other | Attending: Surgery | Admitting: Rehabilitation

## 2020-04-25 ENCOUNTER — Encounter: Payer: Self-pay | Admitting: Rehabilitation

## 2020-04-25 DIAGNOSIS — M25611 Stiffness of right shoulder, not elsewhere classified: Secondary | ICD-10-CM | POA: Diagnosis not present

## 2020-04-25 DIAGNOSIS — R600 Localized edema: Secondary | ICD-10-CM | POA: Insufficient documentation

## 2020-04-25 DIAGNOSIS — R293 Abnormal posture: Secondary | ICD-10-CM

## 2020-04-25 DIAGNOSIS — Z483 Aftercare following surgery for neoplasm: Secondary | ICD-10-CM

## 2020-04-25 DIAGNOSIS — C50411 Malignant neoplasm of upper-outer quadrant of right female breast: Secondary | ICD-10-CM | POA: Diagnosis not present

## 2020-04-25 DIAGNOSIS — Z170 Estrogen receptor positive status [ER+]: Secondary | ICD-10-CM | POA: Diagnosis not present

## 2020-04-25 NOTE — Therapy (Signed)
[facility] [address] [place], [place], [address] Phone: [phone]   Fax:  [phone]  Physical Therapy Treatment  Patient Details  Name: Theresa Bradley MRN: [id number] Date of Birth: 07/21/1951 Referring Provider (PT): Dr. Thomas Cornett   Encounter Date: 04/25/2020  PT End of Session - 04/25/20 1726    Visit Number  4    Number of Visits  10    Date for PT Re-Evaluation  05/14/20    PT Start Time  1306    PT Stop Time  1356    PT Time Calculation (min)  50 min    Activity Tolerance  Patient tolerated treatment well    Behavior During Therapy  WFL for tasks assessed/performed       Past Medical History:  Diagnosis Date  . Anxiety   . Breast cancer (HCC) 2009   right  . Cancer (HCC)   . COUGH, CHRONIC 05/10/2010  . Dysrhythmia    occationally "skips a beat"  . Family history of brain cancer   . Family history of breast cancer   . Family history of lung cancer   . Family history of multiple myeloma   . Family history of skin cancer   . GERD (gastroesophageal reflux disease)   . HYPERTENSION 04/16/2009  . HYPOTHYROIDISM 04/16/2009  . OSTEOARTHRITIS 04/16/2009  . Personal history of radiation therapy   . SENILE LENTIGO 06/10/2010    Past Surgical History:  Procedure Laterality Date  . BREAST EXCISIONAL BIOPSY Right   . BREAST EXCISIONAL BIOPSY Left   . BREAST LUMPECTOMY Right 2009   radiation only  . BREAST LUMPECTOMY WITH RADIOACTIVE SEED AND SENTINEL LYMPH NODE BIOPSY Right 01/31/2020   Procedure: RIGHT BREAST LUMPECTOMY WITH RADIOACTIVE SEED AND SENTINEL LYMPH NODE BIOPSY;  Surgeon: Cornett, Thomas, MD;  Location: [facility];  Service: General;  Laterality: Right;  PEC BLOCK  . BREAST SURGERY  2009   X 2, cancer stage 0, lumpectomy  . CESAREAN SECTION     1979, 1982  . CHOLECYSTECTOMY    . DILATATION & CURETTAGE/HYSTEROSCOPY WITH MYOSURE  11/2015  . HERNIA REPAIR  2009  . JOINT  REPLACEMENT  2008   hip  . OPEN SURGICAL REPAIR OF GLUTEAL TENDON Left 07/18/2019   Procedure: Left hip bearing surface revision with gluteal tendon repair;  Surgeon: Aluisio, Frank, MD;  Location: [facility];  Service: Orthopedics;  Laterality: Left;  2 hrs  . PORTACATH PLACEMENT N/A 03/15/2020   Procedure: INSERTION PORT-A-CATH WITH ULTRASOUND GUIDANCE;  Surgeon: Cornett, Thomas, MD;  Location: [facility];  Service: General;  Laterality: N/A;  . SIMPLE MASTECTOMY WITH AXILLARY SENTINEL NODE BIOPSY Right 03/15/2020   Procedure: RIGHT SIMPLE MASTECTOMY;  Surgeon: Cornett, Thomas, MD;  Location: [facility];  Service: General;  Laterality: Right;    There were no vitals filed for this visit.  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 Patient underwent a right lumpectomy and sentinel node biopsy (1 negative node) on 01/31/2020 but then underwent a right mastectomy and another negative node removed on 03/15/2020 due to not getting clear margins. Ki67 is 20%. She has a history of a left hip replacement in 2008 and a revision in 06/2019. 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 She underwent a right lumpectomy and radiation at that time.    Currently in Pain?  Yes    Pain Score  1     Pain Location  Chest    Pain Orientation  Right    Pain Descriptors / Indicators  Tender    Pain Type  Acute pain    Pain Onset  More than a month ago    Pain Frequency  Intermittent                        [facility] Adult PT Treatment/Exercise - 04/25/20 0001      Manual Therapy   Manual Therapy  Edema management    Edema Management  gave pt 1/2" gray foam rectangle for lateral to the prosthesis to increase compression to the lateral chest    Manual Lymphatic Drainage (MLD)  In supine:  short neck, SCF, bil shoulder collectors, bil axillary and R inguinal nodes, anterior inter-axillary and R axillo-ingiunal anastomosis, re-worked nodes, 5 diaphragmatic breathes. Then in left sidelying posterior interaxillary and axillo inguinal work.  pillow between the knees as pt has hip replacement history.  May prefer not to go sidelying again but was fine with it today    Passive ROM  P/ROM into flexion/abduction, D2 flexion pattern and External rotation                   PT Long Term Goals - 04/16/20 1221      PT LONG TERM GOAL #1   Title  Patient will demonstrate she has regained full shoulder ROM and function post operatively compared to baselines.    Time  4    Period  Weeks    Status  On-going    Target Date  05/14/20      PT LONG TERM GOAL #2   Title  Patient will increase right shoulder flexion to >/= 135 degrees for increased ease reaching overhead.    Baseline  135 pre-op; 123 post op    Time  4    Period  Weeks    Status  New    Target Date  05/14/20      PT LONG TERM GOAL #3   Title  Patient will increase right shoulder abduction to >/= 150 degrees for increased ease reaching overhead.    Baseline  133 post op; 152 pre-op    Time  4    Period  Weeks    Status  New    Target Date  05/14/20      PT LONG TERM GOAL #4   Title  Patient will report >/= 50% decrease in right chest edema for increase comfort.    Time  4    Period  Weeks    Status  New    Target Date  05/14/20      PT LONG TERM GOAL #5   Title  Patient will verbalize good understanding of risk reduction practices for lymphedema.    Time  4    Period  Weeks    Status  New    Target Date  05/14/20            Plan - 04/25/20 1727    Clinical Impression Statement  Reviewed self MLD while PT performed each step in supine.  Pt with good understanding and technique requiring minimal hand over hand cueing.  Pt continues with chest wall seroma present with waving like fluid motion  medially with lateral  pressure.  Discussed compression vs compression with MLD for seroma treatment and gave pt foam to get more compression lateral to the prosthesis.    PT Frequency  2x / week    PT Duration  4 weeks    PT Treatment/Interventions  ADLs/Self Care Home Management;Therapeutic exercise;Patient/family education;Manual techniques;Manual lymph drainage;Passive range of motion;Scar mobilization    PT Next Visit Plan  cont Rt chest/seroma MLD; continue with PROM right shoulder to increase shoulder flexion and abduction. Increase exercises for home fot the R shoulder    PT Home Exercise Plan  Post op shoulder ROM HEP, self MLD    Consulted and Agree with Plan of Care  Patient       Patient will benefit from skilled therapeutic intervention in order to improve the following deficits and impairments:     Visit Diagnosis: Malignant neoplasm of upper-outer quadrant of right breast in female, estrogen receptor positive ([place])  Abnormal posture  Aftercare following surgery for neoplasm  Localized edema  Stiffness of right shoulder, not elsewhere classified     Problem List Patient Active Problem List   Diagnosis Date Noted  . Genetic testing 02/03/2020  . Family history of breast cancer   . Family history of skin cancer   . Family history of brain cancer   . Family history of lung cancer   . Family history of multiple myeloma   . Malignant neoplasm of upper-outer quadrant of right breast in female, estrogen receptor positive ([place]) 01/04/2020  . Melanoma of skin ([place]) 08/15/2019  . 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 Failed total hip arthroplasty ([place]) 07/18/2019  . Cough 05/31/2014  . GERD (gastroesophageal reflux disease) 09/13/2012  . HYPOTHYROIDISM 04/16/2009  . Essential hypertension 04/16/2009  . Osteoarthritis 04/16/2009    Stark Bray 04/25/2020, 5:31 PM  [facility] [place], [place], [address] Phone: [phone]   Fax:  [phone]  Name: Theresa Bradley MRN: [id number] Date of Birth: [birth year]

## 2020-04-25 NOTE — Progress Notes (Signed)
Pharmacist Chemotherapy Monitoring - Follow Up Assessment    I verify that I have reviewed each item in the below checklist:  . Regimen for the patient is scheduled for the appropriate day and plan matches scheduled date. Marland Kitchen Appropriate non-routine labs are ordered dependent on drug ordered. . If applicable, additional medications reviewed and ordered per protocol based on lifetime cumulative doses and/or treatment regimen.   Plan for follow-up and/or issues identified: No . I-vent associated with next due treatment: No . MD and/or nursing notified: No  Theresa Bradley 04/25/2020 11:23 AM

## 2020-04-30 DIAGNOSIS — J3089 Other allergic rhinitis: Secondary | ICD-10-CM | POA: Diagnosis not present

## 2020-04-30 DIAGNOSIS — J3081 Allergic rhinitis due to animal (cat) (dog) hair and dander: Secondary | ICD-10-CM | POA: Diagnosis not present

## 2020-04-30 DIAGNOSIS — J301 Allergic rhinitis due to pollen: Secondary | ICD-10-CM | POA: Diagnosis not present

## 2020-04-30 NOTE — Progress Notes (Signed)
Theresa Bradley  [phone] Fax:([phone]) [phone]     ID: ERMELINDA Bradley DOB: January 19, 1951  MR#: [id number]  PNT#:[id number]  Patient Care Team: [name], MD as PCP - General [name], [name], MD as Consulting Physician (Allergy and Immunology) [name], OD as Referring Physician [name], RN as Oncology Nurse Navigator [name], RN as Oncology Nurse Navigator [name], MD as Consulting Physician (General Surgery) Khaled Herda, [name], MD as Consulting Physician (Oncology) [name], MD as Attending Physician (Radiation Oncology) [name], MD as Referring Physician (Dermatology) [name], MD as Consulting Physician (Cardiology) Chauncey Cruel, MD OTHER MD:  CHIEF COMPLAINT: triple positive breast cancer (s/p right mastectomy)  CURRENT TREATMENT: weekly paclitaxel x12; trastuzumab to complete a year   INTERVAL HISTORY: [name]" returns today for follow up and treatment of her triple positive breast cancer accompanied by her husband Wanette.   She started on weekly paclitaxel and trastuzumab on 04/24/2020. Today is week 2.   Her echocardiogram 01/17/2020 showed an ejection fraction in the 60-65% range.  Since she only just started her chemo she will not need a repeat echo until late August or early September   REVIEW OF SYSTEMS: [name] did generally well with the first cycle and had no nausea but she had pain in the lower back, left hip, and the lower pelvis as well as the right angle of the jaw.  This was severe enough that it was hard for her to sleep and she had to take some hydrocodone.  During the day she took Tylenol.  She also had diarrhea for about a day, with 4 loose bowel movements that day.  She did not take Imodium for this.  She has had some indigestion and burning, and mild headache but she is taking walks and was able to work yesterday and feels back to normal today.  There has been  no peripheral neuropathy.  She has not yet started to have any scalp tingling.   HISTORY OF CURRENT ILLNESS: From the original intake note:   Theresa Bradley was my patient a little over 11 years ago when she underwent lumpectomy on 05/15/2008 for ductal carcinoma in situ and received radiation therapy under Dr. Valere Dross. 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  More recently, she presented to her PCP with a small palpable right breast lump around 11 o'clock, just superior and lateral to the nipple. She underwent bilateral diagnostic mammography with tomography and right breast ultrasonography at [facility] on 12/26/2019 showing: breast density category B; 6 mm superficial mass in the right breast at 11 o'clock in the retroareolar region; no enlarged adenopathy in right axilla.  Accordingly on 12/30/2019 she proceeded to biopsy of the right breast area in question. The pathology from this procedure ([id number]) showed: invasive ductal carcinoma, grade 2. Prognostic indicators significant for: estrogen receptor, 95% positive and progesterone receptor, 80% positive, both with strong staining intensity. Proliferation marker Ki67 at 20%. HER2 positive by immunohistochemistry (3+).  The patient's subsequent history is as detailed below.   PAST MEDICAL HISTORY: Past Medical History:  Diagnosis Date   Anxiety    Breast cancer ([place]) 2009   right   Cancer ([place])    COUGH, CHRONIC 05/10/2010   Dysrhythmia    occationally "skips a beat"   Family history of brain  cancer    Family history of breast cancer    Family history of lung cancer    Family history of multiple myeloma    Family history of skin cancer    GERD (gastroesophageal reflux disease)    HYPERTENSION 04/16/2009   HYPOTHYROIDISM 04/16/2009   OSTEOARTHRITIS 04/16/2009   Personal history of radiation therapy    SENILE  LENTIGO 06/10/2010    PAST SURGICAL HISTORY: Past Surgical History:  Procedure Laterality Date   BREAST EXCISIONAL BIOPSY Right    BREAST EXCISIONAL BIOPSY Left    BREAST LUMPECTOMY Right 2009   radiation only   BREAST LUMPECTOMY WITH RADIOACTIVE SEED AND SENTINEL LYMPH NODE BIOPSY Right 01/31/2020   Procedure: RIGHT BREAST LUMPECTOMY WITH RADIOACTIVE SEED AND SENTINEL LYMPH NODE BIOPSY;  Surgeon: [name], MD;  Location: [facility];  Service: General;  Laterality: Right;  [place]  2009   X 2, cancer stage 0, lumpectomy   CESAREAN SECTION     1979, [place]  11/2015   HERNIA REPAIR  2009   JOINT REPLACEMENT  2008   hip   OPEN SURGICAL REPAIR OF GLUTEAL TENDON Left 07/18/2019   Procedure: Left hip bearing surface revision with gluteal tendon repair;  Surgeon: Gaynelle Arabian, MD;  [facility];  Service: Orthopedics;  Laterality: Left;  2 hrs   PORTACATH PLACEMENT N/A 03/15/2020   Procedure: INSERTION PORT-A-CATH WITH ULTRASOUND GUIDANCE;  Surgeon: [name], MD;  Location: [facility];  Service: General;  Laterality: N/A;   SIMPLE MASTECTOMY WITH AXILLARY SENTINEL NODE BIOPSY Right 03/15/2020   Procedure: RIGHT SIMPLE MASTECTOMY;  Surgeon: [name], MD;  Location: [facility];  Service: General;  Laterality: Right;    FAMILY HISTORY: Family History  Problem Relation Age of Onset   Arthritis Other    Hypertension Other    Cancer Other        2 aunts   Hyperlipidemia Father    Dementia Father    Skin cancer Father    Skin cancer Mother    Breast cancer Maternal Aunt 28   Breast cancer Maternal Aunt        dx. mid-70s   Lung cancer Paternal Uncle    Multiple myeloma Paternal Uncle    Cancer Paternal Aunt        unknown type, dx. >50   Brain cancer Cousin        dx. 71s, paternal cousin   The patient's  father is 25 years old and the patient's mother is 46 years old as of February 2021.  The patient has one brother, no sisters.  A maternal aunt was diagnosed with breast cancer at age 21.  A paternal uncle was diagnosed with lung cancer in his 2s and another paternal uncle with a "blood cancer" in his late 86s.  There is no history of ovarian pancreatic or prostate cancer in the family to her knowledge.   GYNECOLOGIC HISTORY:  No LMP recorded. Patient is postmenopausal. Menarche: 69 years old Age at first live birth: 69 years old [place] P 2 LMP HRT no  Hysterectomy?  No BSO?  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  Her husband Patrick Jupiter runs and environmental company that for example checks for mold and then remediate.  Son Delfino Lovett,  23 years old, lives in [place] and works as a Clinical biochemist.  He has 2 sons.  The patient's son Catalina Antigua 8 lives in [place] and works in Chief Executive Officer.  He has no children.  The patient is Episcopalian    ADVANCED DIRECTIVES: In the absence of any documentation to the contrary, the patient's spouse is their HCPOA.    HEALTH MAINTENANCE: Social History   Tobacco Use   Smoking status: Never Smoker   Smokeless tobacco: Never Used   Tobacco comment: father smoked when she was younger   Substance Use Topics   Alcohol use: Yes    Comment: occasionally    Drug use: No     Colonoscopy: 03/2009/[place]  PAP: 10/2013, negative  Bone density: 11/2015, T score -1.7   Allergies  Allergen Reactions   Codeine Sulfate Nausea And Vomiting   Erythromycin Base Other (See Comments)    Stomach ache   Esomeprazole Magnesium     REACTION: GI upset    Current Outpatient Medications  Medication Sig Dispense Refill   ALPRAZolam (XANAX) 0.25 MG tablet TAKE 1/2 TABLET BY MOUTH AT BEDTIME AS NEEDED INSOMNIA 45 tablet 0   amLODipine (NORVASC) 5 MG  tablet Take 1 tablet (5 mg total) by mouth daily. 90 tablet 3   BIOTIN 5000 PO Take by mouth.     Calcium Carbonate-Vitamin D 600-200 MG-UNIT TABS Take 1 tablet by mouth daily.     citalopram (CELEXA) 20 MG tablet Take 1 tablet (20 mg total) by mouth daily. 90 tablet 3   docusate sodium (COLACE) 100 MG capsule Take 100 mg by mouth daily as needed for mild constipation.      EPIPEN 2-PAK 0.3 MG/0.3ML SOAJ injection Inject 0.3 mg into the muscle as needed for anaphylaxis.      famotidine (PEPCID) 10 MG tablet Take 10 mg by mouth daily.      levothyroxine (SYNTHROID) 50 MCG tablet TAKE 1 TABLET BY MOUTH EVERY DAY 90 tablet 1   lidocaine-prilocaine (EMLA) cream Apply to affected area once 30 g 3   meloxicam (MOBIC) 15 MG tablet TAKE 1 TABLET BY MOUTH EVERY DAY 90 tablet 1   mupirocin ointment (BACTROBAN) 2 % APPLY TO AFFECTED AREA EVERY DAY     oxyCODONE (OXY IR/ROXICODONE) 5 MG immediate release tablet Take 1 tablet (5 mg total) by mouth every 6 (six) hours as needed for severe pain. 15 tablet 0   prochlorperazine (COMPAZINE) 10 MG tablet Take 1 tablet (10 mg total) by mouth every 6 (six) hours as needed (Nausea or vomiting). 30 tablet 1   tretinoin (RETIN-A) 0.025 % cream Apply topically at bedtime. 45 g 6   No current facility-administered medications for this visit.   Facility-Administered Medications Ordered in Other Visits  Medication Dose Route Frequency Provider Last Rate Last Admin   sodium chloride flush (NS) 0.9 % injection 10 mL  10 mL Intravenous PRN Micky Sheller, [name], MD   10 mL at 05/01/20 0846    OBJECTIVE: white woman who appears stated age  There were no vitals filed for this visit.   There is no height or weight on file to calculate BMI.   Wt Readings from Last 3 Encounters:  04/24/20 155 lb 4.8 oz (70.4 kg)  04/02/20 155 lb (70.3 kg)  03/15/20 157 lb 10.1 oz (71.5 kg)  For today's vitals please see the infusion area flowsheet    ECOG FS:1 - Symptomatic  but completely ambulatory  Sclerae unicteric, EOMs intact Wearing a  mask No cervical or supraclavicular adenopathy Lungs no rales or rhonchi Heart regular rate and rhythm Abd soft, nontender, positive bowel sounds MSK no focal spinal tenderness, no upper extremity lymphedema Neuro: nonfocal, well oriented, appropriate affect Breasts: Deferred   LAB RESULTS:  CMP     Component Value Date/Time   NA 140 04/02/2020 0916   K 3.5 04/02/2020 0916   CL 102 04/02/2020 0916   CO2 29 04/02/2020 0916   GLUCOSE 137 (H) 04/02/2020 0916   BUN 8 04/02/2020 0916   CREATININE 0.85 04/02/2020 0916   CREATININE 0.81 01/11/2020 1209   CALCIUM 9.2 04/02/2020 0916   PROT 7.1 04/02/2020 0916   ALBUMIN 3.6 04/02/2020 0916   AST 15 04/02/2020 0916   AST 14 (L) 01/11/2020 1209   ALT 15 04/02/2020 0916   ALT 13 01/11/2020 1209   ALKPHOS 73 04/02/2020 0916   BILITOT 0.5 04/02/2020 0916   BILITOT 0.5 01/11/2020 1209   GFRNONAA >60 04/02/2020 0916   GFRNONAA >60 01/11/2020 1209   GFRAA >60 04/02/2020 0916   GFRAA >60 01/11/2020 1209    No results found for: TOTALPROTELP, ALBUMINELP, A1GS, A2GS, BETS, BETA2SER, GAMS, MSPIKE, SPEI  Lab Results  Component Value Date   WBC 4.6 05/01/2020   NEUTROABS 2.8 05/01/2020   HGB 12.1 05/01/2020   HCT 37.0 05/01/2020   MCV 89.8 05/01/2020   PLT 287 05/01/2020    No results found for: LABCA2  No components found for: QQIWLN989  No results for input(s): INR in the last 168 hours.  No results found for: LABCA2  No results found for: QJJ941  No results found for: DEY814  No results found for: GYJ856  No results found for: CA2729  No components found for: HGQUANT  No results found for: CEA1 / No results found for: CEA1   No results found for: AFPTUMOR  No results found for: CHROMOGRNA  No results found for: KPAFRELGTCHN, LAMBDASER, KAPLAMBRATIO (kappa/lambda light chains)  No results found for: HGBA, HGBA2QUANT, HGBFQUANT,  HGBSQUAN (Hemoglobinopathy evaluation)   No results found for: LDH  No results found for: IRON, TIBC, IRONPCTSAT (Iron and TIBC)  No results found for: FERRITIN  Urinalysis    Component Value Date/Time   COLORURINE yellow 05/24/2010 0855   APPEARANCEUR Clear 05/24/2010 0855   LABSPEC 1.015 05/24/2010 0855   PHURINE 6.0 05/24/2010 0855   GLUCOSEU NEGATIVE 09/22/2007 1110   HGBUR negative 05/24/2010 0855   BILIRUBINUR n 06/29/2019 1533   KETONESUR NEGATIVE 09/22/2007 1110   PROTEINUR Negative 06/29/2019 [place] 09/22/2007 1110   UROBILINOGEN 0.2 06/29/2019 1533   UROBILINOGEN 0.2 05/24/2010 0855   NITRITE n 06/29/2019 1533   NITRITE negative 05/24/2010 0855   LEUKOCYTESUR Negative 06/29/2019 1533     STUDIES: No results found.   ELIGIBLE FOR AVAILABLE RESEARCH PROTOCOL: no  ASSESSMENT: 69 y.o. [place] woman status post right breast periareolar biopsy 12/30/2019 for a clinical T1a N0, stage I invasive ductal carcinoma, grade 2, estrogen and progesterone receptor positive, HER-2 amplified, with an MIB-one of 20%.  (1) history of prior right lumpectomy June 2009 for ductal carcinoma in situ  (a) status post adjuvant radiation  (b) did not receive antiestrogens  (2) status post repeat right lumpectomy and sentinel lymph node sampling 01/31/2020 for a pT1c pN0, stage IA invasive ductal carcinoma, grade 2, with positive margins  (a) total one sentinel node removed  (3) s/p right mastectomy 03/15/2020 showing residual invasive ductal carcinoma measuring 0.9 cm, but negative margins (  added to prior 1.1 lumpectomy, still T1 N0 or stage IA)  (a) one additional axillary node was removed (total 2 right axillary nodes removed)  (3) adjuvant chemotherapy with paclitaxel and trastuzumab weekly x 12 started 04/24/2020 04/17/2020  (4) trastuzumab to be continued every 21 days to total one year  (a) echo 01/17/2020 shows an ejection fraction in the 60-65%  range  (5) to start antiestrogens at the completion of local treatment.  (6) genetics testing 02/01/2020 through the [facility] Breast [facility] STAT panel found no deleterious mutations then ATM, BRCA1, BRCA2, CDH1, CHEK2, PALB2, PTEN, STK11 and TP53.    PLAN: Tanetta did well with her first dose of Taxol/Herceptin.  I do not expect her to have the same aches and pains the second time: That is mostly her first dose phenomenon.  If those discomforts occurred they should be much milder.  If not she will let me know.  We discussed what to do if the diarrhea returns, but she handled it well the first time.  We reviewed issues regarding peripheral neuropathy.  I commended her a walking program.  We will not be seeing her next week but she knows that if there is an issue I will be glad to pop in the back and see what the problem is.  Otherwise we will see her 2 weeks from now  She knows to call for any other issue that may develop before then  Total encounter time 25 minutes.Chauncey Cruel, MD   05/01/2020 9:14 AM Medical Oncology and Hematology [facility] [place], [place] [address] Tel. [phone]    Fax. [phone]   This document serves as a record of services personally performed by Lurline Del, MD. It was created on his behalf by Wilburn Mylar, a trained medical scribe. The creation of this record is based on the scribe's personal observations and the provider's statements to them.   I, Lurline Del MD, have reviewed the above documentation for accuracy and completeness, and I agree with the above.   *Total Encounter Time as defined by the Centers for Medicare and Medicaid Services includes, in addition to the face-to-face time of a patient visit (documented in the note above) non-face-to-face time: obtaining and reviewing outside history, ordering and reviewing medications, tests or procedures, care coordination (communications with other  health care professionals or caregivers) and documentation in the medical record.

## 2020-05-01 ENCOUNTER — Inpatient Hospital Stay: Payer: Medicare Other

## 2020-05-01 ENCOUNTER — Encounter: Payer: Self-pay | Admitting: *Deleted

## 2020-05-01 ENCOUNTER — Inpatient Hospital Stay: Payer: Medicare Other | Admitting: Oncology

## 2020-05-01 ENCOUNTER — Other Ambulatory Visit: Payer: Self-pay

## 2020-05-01 VITALS — BP 148/72 | HR 55 | Temp 97.6°F | Resp 18

## 2020-05-01 DIAGNOSIS — Z9011 Acquired absence of right breast and nipple: Secondary | ICD-10-CM | POA: Diagnosis not present

## 2020-05-01 DIAGNOSIS — Z170 Estrogen receptor positive status [ER+]: Secondary | ICD-10-CM

## 2020-05-01 DIAGNOSIS — Z808 Family history of malignant neoplasm of other organs or systems: Secondary | ICD-10-CM | POA: Diagnosis not present

## 2020-05-01 DIAGNOSIS — C50411 Malignant neoplasm of upper-outer quadrant of right female breast: Secondary | ICD-10-CM | POA: Diagnosis not present

## 2020-05-01 DIAGNOSIS — R040 Epistaxis: Secondary | ICD-10-CM | POA: Diagnosis not present

## 2020-05-01 DIAGNOSIS — Z780 Asymptomatic menopausal state: Secondary | ICD-10-CM | POA: Diagnosis not present

## 2020-05-01 DIAGNOSIS — Z853 Personal history of malignant neoplasm of breast: Secondary | ICD-10-CM

## 2020-05-01 DIAGNOSIS — C50111 Malignant neoplasm of central portion of right female breast: Secondary | ICD-10-CM | POA: Diagnosis not present

## 2020-05-01 DIAGNOSIS — Z86006 Personal history of melanoma in-situ: Secondary | ICD-10-CM | POA: Diagnosis not present

## 2020-05-01 DIAGNOSIS — Z5111 Encounter for antineoplastic chemotherapy: Secondary | ICD-10-CM | POA: Diagnosis not present

## 2020-05-01 DIAGNOSIS — Z923 Personal history of irradiation: Secondary | ICD-10-CM | POA: Diagnosis not present

## 2020-05-01 DIAGNOSIS — Z86000 Personal history of in-situ neoplasm of breast: Secondary | ICD-10-CM | POA: Diagnosis not present

## 2020-05-01 DIAGNOSIS — L738 Other specified follicular disorders: Secondary | ICD-10-CM | POA: Diagnosis not present

## 2020-05-01 DIAGNOSIS — Z801 Family history of malignant neoplasm of trachea, bronchus and lung: Secondary | ICD-10-CM | POA: Diagnosis not present

## 2020-05-01 DIAGNOSIS — Z803 Family history of malignant neoplasm of breast: Secondary | ICD-10-CM | POA: Diagnosis not present

## 2020-05-01 DIAGNOSIS — R5383 Other fatigue: Secondary | ICD-10-CM | POA: Diagnosis not present

## 2020-05-01 LAB — COMPREHENSIVE METABOLIC PANEL
ALT: 24 U/L (ref 0–44)
AST: 16 U/L (ref 15–41)
Albumin: 3.6 g/dL (ref 3.5–5.0)
Alkaline Phosphatase: 63 U/L (ref 38–126)
Anion gap: 11 (ref 5–15)
BUN: 11 mg/dL (ref 8–23)
CO2: 24 mmol/L (ref 22–32)
Calcium: 9.5 mg/dL (ref 8.9–10.3)
Chloride: 107 mmol/L (ref 98–111)
Creatinine, Ser: 0.76 mg/dL (ref 0.44–1.00)
GFR calc Af Amer: >60 mL/min (ref >60)
GFR calc non Af Amer: >60 mL/min (ref >60)
Glucose, Bld: 98 mg/dL (ref 70–99)
Potassium: 3.4 mmol/L — ABNORMAL LOW (ref 3.5–5.1)
Sodium: 142 mmol/L (ref 135–145)
Total Bilirubin: 0.8 mg/dL (ref 0.3–1.2)
Total Protein: 7 g/dL (ref 6.5–8.1)

## 2020-05-01 LAB — CBC WITH DIFFERENTIAL/PLATELET
Abs Immature Granulocytes: 0.03 10*3/uL (ref 0.00–0.07)
Basophils Absolute: 0 10*3/uL (ref 0.0–0.1)
Basophils Relative: 1 %
Eosinophils Absolute: 0.3 10*3/uL (ref 0.0–0.5)
Eosinophils Relative: 6 %
HCT: 37 % (ref 36.0–46.0)
Hemoglobin: 12.1 g/dL (ref 12.0–15.0)
Immature Granulocytes: 1 %
Lymphocytes Relative: 29 %
Lymphs Abs: 1.3 10*3/uL (ref 0.7–4.0)
MCH: 29.4 pg (ref 26.0–34.0)
MCHC: 32.7 g/dL (ref 30.0–36.0)
MCV: 89.8 fL (ref 80.0–100.0)
Monocytes Absolute: 0.2 10*3/uL (ref 0.1–1.0)
Monocytes Relative: 4 %
Neutro Abs: 2.8 10*3/uL (ref 1.7–7.7)
Neutrophils Relative %: 59 %
Platelets: 287 10*3/uL (ref 150–400)
RBC: 4.12 MIL/uL (ref 3.87–5.11)
RDW: 12.5 % (ref 11.5–15.5)
WBC: 4.6 10*3/uL (ref 4.0–10.5)
nRBC: 0 % (ref 0.0–0.2)

## 2020-05-01 MED ORDER — TRASTUZUMAB-DKST CHEMO 150 MG IV SOLR
150.0000 mg | Freq: Once | INTRAVENOUS | Status: AC
  Administered 2020-05-01: 150 mg via INTRAVENOUS
  Filled 2020-05-01: qty 7.14

## 2020-05-01 MED ORDER — SODIUM CHLORIDE 0.9% FLUSH
10.0000 mL | INTRAVENOUS | Status: DC | PRN
  Administered 2020-05-01: 10 mL via INTRAVENOUS
  Filled 2020-05-01: qty 10

## 2020-05-01 MED ORDER — FAMOTIDINE IN NACL 20-0.9 MG/50ML-% IV SOLN
20.0000 mg | Freq: Once | INTRAVENOUS | Status: AC
  Administered 2020-05-01: 20 mg via INTRAVENOUS

## 2020-05-01 MED ORDER — SODIUM CHLORIDE 0.9 % IV SOLN
Freq: Once | INTRAVENOUS | Status: AC
  Filled 2020-05-01: qty 250

## 2020-05-01 MED ORDER — DIPHENHYDRAMINE HCL 50 MG/ML IJ SOLN
INTRAMUSCULAR | Status: AC
  Filled 2020-05-01: qty 1

## 2020-05-01 MED ORDER — FAMOTIDINE IN NACL 20-0.9 MG/50ML-% IV SOLN
INTRAVENOUS | Status: AC
  Filled 2020-05-01: qty 50

## 2020-05-01 MED ORDER — SODIUM CHLORIDE 0.9 % IV SOLN
10.0000 mg | Freq: Once | INTRAVENOUS | Status: AC
  Administered 2020-05-01: 10 mg via INTRAVENOUS
  Filled 2020-05-01: qty 1
  Filled 2020-05-01: qty 10

## 2020-05-01 MED ORDER — SODIUM CHLORIDE 0.9 % IV SOLN
80.0000 mg/m2 | Freq: Once | INTRAVENOUS | Status: AC
  Administered 2020-05-01: 144 mg via INTRAVENOUS
  Filled 2020-05-01: qty 24

## 2020-05-01 MED ORDER — DIPHENHYDRAMINE HCL 50 MG/ML IJ SOLN
25.0000 mg | Freq: Once | INTRAMUSCULAR | Status: AC
  Administered 2020-05-01: 25 mg via INTRAVENOUS

## 2020-05-01 MED ORDER — ACETAMINOPHEN 325 MG PO TABS
ORAL_TABLET | ORAL | Status: AC
  Filled 2020-05-01: qty 2

## 2020-05-01 MED ORDER — ACETAMINOPHEN 325 MG PO TABS
650.0000 mg | ORAL_TABLET | Freq: Once | ORAL | Status: AC
  Administered 2020-05-01: 650 mg via ORAL

## 2020-05-01 MED ORDER — HEPARIN SOD (PORK) LOCK FLUSH 100 UNIT/ML IV SOLN
500.0000 [IU] | Freq: Once | INTRAVENOUS | Status: AC | PRN
  Administered 2020-05-01: 500 [IU]
  Filled 2020-05-01: qty 5

## 2020-05-01 MED ORDER — SODIUM CHLORIDE 0.9% FLUSH
10.0000 mL | INTRAVENOUS | Status: DC | PRN
  Administered 2020-05-01: 10 mL
  Filled 2020-05-01: qty 10

## 2020-05-01 NOTE — Patient Instructions (Signed)
[facility] Discharge Instructions for Patients Receiving Chemotherapy  Today you received the following chemotherapy agents Trastuzumab and Taxol  To help prevent nausea and vomiting after your treatment, we encourage you to take your nausea medication as directed.    If you develop nausea and vomiting that is not controlled by your nausea medication, call the clinic.   BELOW ARE SYMPTOMS THAT SHOULD BE REPORTED IMMEDIATELY:  *FEVER GREATER THAN 100.5 F  *CHILLS WITH OR WITHOUT FEVER  NAUSEA AND VOMITING THAT IS NOT CONTROLLED WITH YOUR NAUSEA MEDICATION  *UNUSUAL SHORTNESS OF BREATH  *UNUSUAL BRUISING OR BLEEDING  TENDERNESS IN MOUTH AND THROAT WITH OR WITHOUT PRESENCE OF ULCERS  *URINARY PROBLEMS  *BOWEL PROBLEMS  UNUSUAL RASH Items with * indicate a potential emergency and should be followed up as soon as possible.  Feel free to call the clinic should you have any questions or concerns. The clinic phone number is [phone].  Please show the CHEMO ALERT CARD at check-in to the Emergency Department and triage nurse.   

## 2020-05-02 ENCOUNTER — Encounter: Payer: Self-pay | Admitting: Rehabilitation

## 2020-05-02 ENCOUNTER — Ambulatory Visit: Payer: Medicare Other | Admitting: Rehabilitation

## 2020-05-02 ENCOUNTER — Telehealth: Payer: Self-pay | Admitting: Oncology

## 2020-05-02 DIAGNOSIS — Z483 Aftercare following surgery for neoplasm: Secondary | ICD-10-CM

## 2020-05-02 DIAGNOSIS — R600 Localized edema: Secondary | ICD-10-CM | POA: Diagnosis not present

## 2020-05-02 DIAGNOSIS — Z170 Estrogen receptor positive status [ER+]: Secondary | ICD-10-CM | POA: Diagnosis not present

## 2020-05-02 DIAGNOSIS — M25611 Stiffness of right shoulder, not elsewhere classified: Secondary | ICD-10-CM | POA: Diagnosis not present

## 2020-05-02 DIAGNOSIS — C50411 Malignant neoplasm of upper-outer quadrant of right female breast: Secondary | ICD-10-CM | POA: Diagnosis not present

## 2020-05-02 DIAGNOSIS — R293 Abnormal posture: Secondary | ICD-10-CM | POA: Diagnosis not present

## 2020-05-02 NOTE — Telephone Encounter (Signed)
No 6/8 los. No changes made to pt's schedule.

## 2020-05-02 NOTE — Therapy (Signed)
[place], [place], [address] Phone: [phone]   Fax:  [phone]  Physical Therapy Treatment  Patient Details  Name: Theresa Bradley MRN: [id number] Date of Birth: 24-May-1951 Referring Provider (PT): Dr. [name]   Encounter Date: 05/02/2020  PT End of Session - 05/02/20 1639    Visit Number  5    Number of Visits  10    Date for PT Re-Evaluation  05/14/20    PT Start Time  1400    PT Stop Time  1453    PT Time Calculation (min)  53 min    Activity Tolerance  Patient tolerated treatment well    Behavior During Therapy  [facility] for tasks assessed/performed       Past Medical History:  Diagnosis Date  . Anxiety   . Breast cancer ([place]) 2009   right  . Cancer ([place])   . COUGH, CHRONIC 05/10/2010  . Dysrhythmia    occationally "skips a beat"  . Family history of brain cancer   . Family history of breast cancer   . Family history of lung cancer   . Family history of multiple myeloma   . Family history of skin cancer   . GERD (gastroesophageal reflux disease)   . HYPERTENSION 04/16/2009  . HYPOTHYROIDISM 04/16/2009  . OSTEOARTHRITIS 04/16/2009  . Personal history of radiation therapy   . SENILE LENTIGO 06/10/2010    Past Surgical History:  Procedure Laterality Date  . BREAST EXCISIONAL BIOPSY Right   . BREAST EXCISIONAL BIOPSY Left   . BREAST LUMPECTOMY Right 2009   radiation only  . BREAST LUMPECTOMY WITH RADIOACTIVE SEED AND SENTINEL LYMPH NODE BIOPSY Right 01/31/2020   Procedure: RIGHT BREAST LUMPECTOMY WITH RADIOACTIVE SEED AND SENTINEL LYMPH NODE BIOPSY;  Surgeon: [name], MD;  Location: [facility];  Service: General;  Laterality: Right;  PEC BLOCK  . BREAST SURGERY  2009   X 2, cancer stage 0, lumpectomy  . [place]  . CHOLECYSTECTOMY    . DILATATION & CURETTAGE/HYSTEROSCOPY WITH MYOSURE  11/2015  . HERNIA REPAIR  2009  . JOINT  REPLACEMENT  2008   hip  . OPEN SURGICAL REPAIR OF GLUTEAL TENDON Left 07/18/2019   Procedure: Left hip bearing surface revision with gluteal tendon repair;  Surgeon: Gaynelle Arabian, MD;  [facility];  Service: Orthopedics;  Laterality: Left;  2 hrs  . PORTACATH PLACEMENT N/A 03/15/2020   Procedure: INSERTION PORT-A-CATH WITH ULTRASOUND GUIDANCE;  Surgeon: [name], MD;  Location: [place] [facility];  Service: General;  Laterality: N/A;  . SIMPLE MASTECTOMY WITH AXILLARY SENTINEL NODE BIOPSY Right 03/15/2020   Procedure: RIGHT SIMPLE MASTECTOMY;  Surgeon: [name], MD;  Location: [facility];  Service: General;  Laterality: Right;    There were no vitals filed for this visit.  Subjective Assessment - 05/02/20 1402    Subjective  I had another chemo yesterday.  I am having alot of joint pain. I think it is better now.  I was not able to wear the compression bra with all of my indigestion.    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 Patient underwent a right lumpectomy and sentinel node biopsy (1 negative node) on 01/31/2020 but then underwent a right mastectomy and another negative node removed on 03/15/2020 due to not getting clear margins. Ki67 is 20%. She has  a history of a left hip replacement in 2008 and a revision in 06/2019. 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 She underwent a right lumpectomy and radiation at that time.    Currently in Pain?  No/denies   not in the chest                       [facility] Adult PT Treatment/Exercise - 05/02/20 0001      Manual Therapy   Edema Management  gave pt longer 1/2" gray foam rectangle for the bra when not wearing the prosthesis    Manual Lymphatic Drainage (MLD)  In supine: short neck, SCF, bil shoulder collectors, bil axillary and R inguinal nodes, anterior inter-axillary and R axillo-ingiunal  anastomosis, re-worked nodes, 5 diaphragmatic breathes. Then in left seated posterior interaxillary and axillo inguinal work.      Passive ROM  P/ROM into flexion/abduction, D2 flexion pattern and External rotation                   PT Long Term Goals - 04/16/20 1221      PT LONG TERM GOAL #1   Title  Patient will demonstrate she has regained full shoulder ROM and function post operatively compared to baselines.    Time  4    Period  Weeks    Status  On-going    Target Date  05/14/20      PT LONG TERM GOAL #2   Title  Patient will increase right shoulder flexion to >/= 135 degrees for increased ease reaching overhead.    Baseline  135 pre-op; 123 post op    Time  4    Period  Weeks    Status  New    Target Date  05/14/20      PT LONG TERM GOAL #3   Title  Patient will increase right shoulder abduction to >/= 150 degrees for increased ease reaching overhead.    Baseline  133 post op; 152 pre-op    Time  4    Period  Weeks    Status  New    Target Date  05/14/20      PT LONG TERM GOAL #4   Title  Patient will report >/= 50% decrease in right chest edema for increase comfort.    Time  4    Period  Weeks    Status  New    Target Date  05/14/20      PT LONG TERM GOAL #5   Title  Patient will verbalize good understanding of risk reduction practices for lymphedema.    Time  4    Period  Weeks    Status  New    Target Date  05/14/20            Plan - 05/02/20 1640    Clinical Impression Statement  Pt now with compresion bra and foam for inserts.  Pt and PT thinking the seroma feels smaller lateral chest region but there is still a 6x11cm rectangle inferior to the axilla and pectoralis belly moving towards the incision that causes a fluid wave of motion medially towards the sternum.    PT Frequency  2x / week    PT Duration  4 weeks    PT Treatment/Interventions  ADLs/Self Care Home Management;Therapeutic exercise;Patient/family education;Manual  techniques;Manual lymph drainage;Passive range of motion;Scar mobilization    PT Next Visit Plan  cont Rt chest/seroma MLD; continue with PROM  right shoulder to increase shoulder flexion and abduction. Increase exercises for home fot the R shoulder    PT Home Exercise Plan  Post op shoulder ROM HEP, self MLD    Consulted and Agree with Plan of Care  Patient       Patient will benefit from skilled therapeutic intervention in order to improve the following deficits and impairments:     Visit Diagnosis: Malignant neoplasm of upper-outer quadrant of right breast in female, estrogen receptor positive ([place])  Abnormal posture  Aftercare following surgery for neoplasm  Localized edema  Stiffness of right shoulder, not elsewhere classified     Problem List Patient Active Problem List   Diagnosis Date Noted  . Genetic testing 02/03/2020  . Family history of breast cancer   . Family history of skin cancer   . Family history of brain cancer   . Family history of lung cancer   . Family history of multiple myeloma   . Malignant neoplasm of upper-outer quadrant of right breast in female, estrogen receptor positive ([place]) 01/04/2020  . Melanoma of skin ([place]) 08/15/2019  . 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 Failed total hip arthroplasty ([place]) 07/18/2019  . Cough 05/31/2014  . GERD (gastroesophageal reflux disease) 09/13/2012  . HYPOTHYROIDISM 04/16/2009  . Essential hypertension 04/16/2009  . Osteoarthritis 04/16/2009    Stark Bray 05/02/2020, 4:43 PM  [place], [place], [address] Phone: [phone]   Fax:  [phone]  Name: Theresa [facility] MRN: [id number] Date of Birth: 03-12-1951

## 2020-05-08 ENCOUNTER — Inpatient Hospital Stay: Payer: Medicare Other

## 2020-05-08 ENCOUNTER — Other Ambulatory Visit: Payer: Self-pay | Admitting: *Deleted

## 2020-05-08 ENCOUNTER — Other Ambulatory Visit: Payer: Self-pay | Admitting: Oncology

## 2020-05-08 ENCOUNTER — Other Ambulatory Visit: Payer: Self-pay

## 2020-05-08 VITALS — BP 141/62 | HR 58 | Temp 98.3°F | Resp 18 | Wt 151.5 lb

## 2020-05-08 DIAGNOSIS — Z86006 Personal history of melanoma in-situ: Secondary | ICD-10-CM | POA: Diagnosis not present

## 2020-05-08 DIAGNOSIS — Z780 Asymptomatic menopausal state: Secondary | ICD-10-CM | POA: Diagnosis not present

## 2020-05-08 DIAGNOSIS — Z923 Personal history of irradiation: Secondary | ICD-10-CM | POA: Diagnosis not present

## 2020-05-08 DIAGNOSIS — R5383 Other fatigue: Secondary | ICD-10-CM | POA: Diagnosis not present

## 2020-05-08 DIAGNOSIS — C50411 Malignant neoplasm of upper-outer quadrant of right female breast: Secondary | ICD-10-CM

## 2020-05-08 DIAGNOSIS — Z86000 Personal history of in-situ neoplasm of breast: Secondary | ICD-10-CM | POA: Diagnosis not present

## 2020-05-08 DIAGNOSIS — C50111 Malignant neoplasm of central portion of right female breast: Secondary | ICD-10-CM | POA: Diagnosis not present

## 2020-05-08 DIAGNOSIS — Z801 Family history of malignant neoplasm of trachea, bronchus and lung: Secondary | ICD-10-CM | POA: Diagnosis not present

## 2020-05-08 DIAGNOSIS — Z170 Estrogen receptor positive status [ER+]: Secondary | ICD-10-CM | POA: Diagnosis not present

## 2020-05-08 DIAGNOSIS — Z808 Family history of malignant neoplasm of other organs or systems: Secondary | ICD-10-CM | POA: Diagnosis not present

## 2020-05-08 DIAGNOSIS — Z9011 Acquired absence of right breast and nipple: Secondary | ICD-10-CM | POA: Diagnosis not present

## 2020-05-08 DIAGNOSIS — Z5111 Encounter for antineoplastic chemotherapy: Secondary | ICD-10-CM | POA: Diagnosis not present

## 2020-05-08 DIAGNOSIS — Z803 Family history of malignant neoplasm of breast: Secondary | ICD-10-CM | POA: Diagnosis not present

## 2020-05-08 DIAGNOSIS — L738 Other specified follicular disorders: Secondary | ICD-10-CM | POA: Diagnosis not present

## 2020-05-08 DIAGNOSIS — R040 Epistaxis: Secondary | ICD-10-CM | POA: Diagnosis not present

## 2020-05-08 LAB — COMPREHENSIVE METABOLIC PANEL
ALT: 23 U/L (ref 0–44)
AST: 15 U/L (ref 15–41)
Albumin: 3.6 g/dL (ref 3.5–5.0)
Alkaline Phosphatase: 67 U/L (ref 38–126)
Anion gap: 9 (ref 5–15)
BUN: 12 mg/dL (ref 8–23)
CO2: 27 mmol/L (ref 22–32)
Calcium: 9.2 mg/dL (ref 8.9–10.3)
Chloride: 106 mmol/L (ref 98–111)
Creatinine, Ser: 0.8 mg/dL (ref 0.44–1.00)
GFR calc Af Amer: >60 mL/min (ref >60)
GFR calc non Af Amer: >60 mL/min (ref >60)
Glucose, Bld: 112 mg/dL — ABNORMAL HIGH (ref 70–99)
Potassium: 3.5 mmol/L (ref 3.5–5.1)
Sodium: 142 mmol/L (ref 135–145)
Total Bilirubin: 0.8 mg/dL (ref 0.3–1.2)
Total Protein: 7 g/dL (ref 6.5–8.1)

## 2020-05-08 LAB — CBC WITH DIFFERENTIAL/PLATELET
Abs Immature Granulocytes: 0.01 10*3/uL (ref 0.00–0.07)
Basophils Absolute: 0 10*3/uL (ref 0.0–0.1)
Basophils Relative: 1 %
Eosinophils Absolute: 0.2 10*3/uL (ref 0.0–0.5)
Eosinophils Relative: 5 %
HCT: 34.8 % — ABNORMAL LOW (ref 36.0–46.0)
Hemoglobin: 11.5 g/dL — ABNORMAL LOW (ref 12.0–15.0)
Immature Granulocytes: 0 %
Lymphocytes Relative: 32 %
Lymphs Abs: 1.3 10*3/uL (ref 0.7–4.0)
MCH: 29.2 pg (ref 26.0–34.0)
MCHC: 33 g/dL (ref 30.0–36.0)
MCV: 88.3 fL (ref 80.0–100.0)
Monocytes Absolute: 0.3 10*3/uL (ref 0.1–1.0)
Monocytes Relative: 8 %
Neutro Abs: 2.2 10*3/uL (ref 1.7–7.7)
Neutrophils Relative %: 54 %
Platelets: 330 10*3/uL (ref 150–400)
RBC: 3.94 MIL/uL (ref 3.87–5.11)
RDW: 12.7 % (ref 11.5–15.5)
WBC: 4.1 10*3/uL (ref 4.0–10.5)
nRBC: 0 % (ref 0.0–0.2)

## 2020-05-08 MED ORDER — ACETAMINOPHEN 325 MG PO TABS
ORAL_TABLET | ORAL | Status: AC
  Filled 2020-05-08: qty 2

## 2020-05-08 MED ORDER — DIPHENHYDRAMINE HCL 50 MG/ML IJ SOLN
INTRAMUSCULAR | Status: AC
  Filled 2020-05-08: qty 1

## 2020-05-08 MED ORDER — SODIUM CHLORIDE 0.9% FLUSH
10.0000 mL | INTRAVENOUS | Status: DC | PRN
  Administered 2020-05-08: 10 mL
  Filled 2020-05-08: qty 10

## 2020-05-08 MED ORDER — DIPHENHYDRAMINE HCL 50 MG/ML IJ SOLN
25.0000 mg | Freq: Once | INTRAMUSCULAR | Status: AC
  Administered 2020-05-08: 25 mg via INTRAVENOUS

## 2020-05-08 MED ORDER — SODIUM CHLORIDE 0.9 % IV SOLN
4.0000 mg | Freq: Once | INTRAVENOUS | Status: AC
  Administered 2020-05-08: 4 mg via INTRAVENOUS
  Filled 2020-05-08: qty 0.4

## 2020-05-08 MED ORDER — SODIUM CHLORIDE 0.9 % IV SOLN
Freq: Once | INTRAVENOUS | Status: AC
  Filled 2020-05-08: qty 250

## 2020-05-08 MED ORDER — DEXAMETHASONE SODIUM PHOSPHATE 10 MG/ML IJ SOLN
INTRAMUSCULAR | Status: AC
  Filled 2020-05-08: qty 1

## 2020-05-08 MED ORDER — MAGIC MOUTHWASH
5.0000 mL | Freq: Four times a day (QID) | ORAL | 1 refills | Status: DC | PRN
Start: 2020-05-08 — End: 2021-02-18

## 2020-05-08 MED ORDER — HEPARIN SOD (PORK) LOCK FLUSH 100 UNIT/ML IV SOLN
500.0000 [IU] | Freq: Once | INTRAVENOUS | Status: AC | PRN
  Administered 2020-05-08: 500 [IU]
  Filled 2020-05-08: qty 5

## 2020-05-08 MED ORDER — FAMOTIDINE IN NACL 20-0.9 MG/50ML-% IV SOLN
20.0000 mg | Freq: Once | INTRAVENOUS | Status: AC
  Administered 2020-05-08: 20 mg via INTRAVENOUS

## 2020-05-08 MED ORDER — ACETAMINOPHEN 325 MG PO TABS
650.0000 mg | ORAL_TABLET | Freq: Once | ORAL | Status: AC
  Administered 2020-05-08: 650 mg via ORAL

## 2020-05-08 MED ORDER — FAMOTIDINE IN NACL 20-0.9 MG/50ML-% IV SOLN
INTRAVENOUS | Status: AC
  Filled 2020-05-08: qty 50

## 2020-05-08 MED ORDER — TRASTUZUMAB-DKST CHEMO 150 MG IV SOLR
150.0000 mg | Freq: Once | INTRAVENOUS | Status: AC
  Administered 2020-05-08: 150 mg via INTRAVENOUS
  Filled 2020-05-08: qty 7.14

## 2020-05-08 MED ORDER — SODIUM CHLORIDE 0.9 % IV SOLN
80.0000 mg/m2 | Freq: Once | INTRAVENOUS | Status: AC
  Administered 2020-05-08: 144 mg via INTRAVENOUS
  Filled 2020-05-08: qty 24

## 2020-05-08 NOTE — Patient Instructions (Signed)
[facility] Discharge Instructions for Patients Receiving Chemotherapy  Today you received the following chemotherapy agents Trastuzumab and Taxol  To help prevent nausea and vomiting after your treatment, we encourage you to take your nausea medication as directed.    If you develop nausea and vomiting that is not controlled by your nausea medication, call the clinic.   BELOW ARE SYMPTOMS THAT SHOULD BE REPORTED IMMEDIATELY:  *FEVER GREATER THAN 100.5 F  *CHILLS WITH OR WITHOUT FEVER  NAUSEA AND VOMITING THAT IS NOT CONTROLLED WITH YOUR NAUSEA MEDICATION  *UNUSUAL SHORTNESS OF BREATH  *UNUSUAL BRUISING OR BLEEDING  TENDERNESS IN MOUTH AND THROAT WITH OR WITHOUT PRESENCE OF ULCERS  *URINARY PROBLEMS  *BOWEL PROBLEMS  UNUSUAL RASH Items with * indicate a potential emergency and should be followed up as soon as possible.  Feel free to call the clinic should you have any questions or concerns. The clinic phone number is [phone].  Please show the CHEMO ALERT CARD at check-in to the Emergency Department and triage nurse.   

## 2020-05-08 NOTE — Progress Notes (Signed)
Per Dr. Jana Hakim, ok to treat today with echo from 01/17/20.  Patient with complaints of mouth sores that have appeared after treatment last week.  Sore appears light pink in color, skin intact. Patient also states she has a history of sensitive gums and has noticed them to be more tender since her last treatment. Dr. [name] RN, Val informed. Val to speak to Dr. Jana Hakim to get an order for magic mouth wash. Patient informed of new prescription. Patient instructed to call office if mouth sores get worse or do not improve with the magic mouthwash.

## 2020-05-09 ENCOUNTER — Ambulatory Visit: Payer: Medicare Other | Admitting: Rehabilitation

## 2020-05-09 ENCOUNTER — Encounter: Payer: Self-pay | Admitting: Rehabilitation

## 2020-05-09 DIAGNOSIS — Z483 Aftercare following surgery for neoplasm: Secondary | ICD-10-CM | POA: Diagnosis not present

## 2020-05-09 DIAGNOSIS — R293 Abnormal posture: Secondary | ICD-10-CM | POA: Diagnosis not present

## 2020-05-09 DIAGNOSIS — C50411 Malignant neoplasm of upper-outer quadrant of right female breast: Secondary | ICD-10-CM | POA: Diagnosis not present

## 2020-05-09 DIAGNOSIS — M25611 Stiffness of right shoulder, not elsewhere classified: Secondary | ICD-10-CM | POA: Diagnosis not present

## 2020-05-09 DIAGNOSIS — R600 Localized edema: Secondary | ICD-10-CM

## 2020-05-09 DIAGNOSIS — Z170 Estrogen receptor positive status [ER+]: Secondary | ICD-10-CM | POA: Diagnosis not present

## 2020-05-09 NOTE — Therapy (Signed)
[facility] °[address] °[place], [place], [address] °Phone: [phone]   Fax:  [phone] ° °Physical Therapy Treatment ° °Patient Details  °Name: Theresa Bradley °MRN: [id number] °Date of Birth: 12/11/1950 °Referring Provider (PT): Dr. Thomas Cornett ° ° °Encounter Date: 05/09/2020 ° ° PT End of Session - 05/09/20 1444   ° Visit Number 6   ° Number of Visits 10   ° Date for PT Re-Evaluation 05/14/20   ° PT Start Time 1401   ° PT Stop Time 1443   ° PT Time Calculation (min) 42 min   ° Activity Tolerance Patient tolerated treatment well   ° Behavior During Therapy WFL for tasks assessed/performed   °  °  °  ° ° °Past Medical History:  °Diagnosis Date  °• Anxiety   °• Breast cancer (HCC) 2009  ° right  °• Cancer (HCC)   °• COUGH, CHRONIC 05/10/2010  °• Dysrhythmia   ° occationally "skips a beat"  °• Family history of brain cancer   °• Family history of breast cancer   °• Family history of lung cancer   °• Family history of multiple myeloma   °• Family history of skin cancer   °• GERD (gastroesophageal reflux disease)   °• HYPERTENSION 04/16/2009  °• HYPOTHYROIDISM 04/16/2009  °• OSTEOARTHRITIS 04/16/2009  °• Personal history of radiation therapy   °• SENILE LENTIGO 06/10/2010  ° ° °Past Surgical History:  °Procedure Laterality Date  °• BREAST EXCISIONAL BIOPSY Right   °• BREAST EXCISIONAL BIOPSY Left   °• BREAST LUMPECTOMY Right 2009  ° radiation only  °• BREAST LUMPECTOMY WITH RADIOACTIVE SEED AND SENTINEL LYMPH NODE BIOPSY Right 01/31/2020  ° Procedure: RIGHT BREAST LUMPECTOMY WITH RADIOACTIVE SEED AND SENTINEL LYMPH NODE BIOPSY;  Surgeon: Cornett, Thomas, MD;  Location: [facility];  Service: General;  Laterality: Right;  PEC BLOCK  °• BREAST SURGERY  2009  ° X 2, cancer stage 0, lumpectomy  °• CESAREAN SECTION    ° 1979, 1982  °• CHOLECYSTECTOMY    °• DILATATION & CURETTAGE/HYSTEROSCOPY WITH MYOSURE  11/2015  °• HERNIA REPAIR  2009  °• JOINT  REPLACEMENT  2008  ° hip  °• OPEN SURGICAL REPAIR OF GLUTEAL TENDON Left 07/18/2019  ° Procedure: Left hip bearing surface revision with gluteal tendon repair;  Surgeon: Aluisio, Frank, MD;  Location: [facility];  Service: Orthopedics;  Laterality: Left;  2 hrs  °• PORTACATH PLACEMENT N/A 03/15/2020  ° Procedure: INSERTION PORT-A-CATH WITH ULTRASOUND GUIDANCE;  Surgeon: Cornett, Thomas, MD;  Location: [facility];  Service: General;  Laterality: N/A;  °• SIMPLE MASTECTOMY WITH AXILLARY SENTINEL NODE BIOPSY Right 03/15/2020  ° Procedure: RIGHT SIMPLE MASTECTOMY;  Surgeon: Cornett, Thomas, MD;  Location: [facility];  Service: General;  Laterality: Right;  ° ° °There were no vitals filed for this visit. ° ° Subjective Assessment - 05/09/20 1403   ° Subjective Nothing new.  Infusion yesterday.   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 Patient underwent a right lumpectomy and sentinel node biopsy (1 negative node) on 01/31/2020 but then underwent a right mastectomy and another negative node removed on 03/15/2020 due to not getting clear margins. Ki67 is 20%. She has a history of a left hip replacement in 2008 and a revision in 06/2019. 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 She underwent a right lumpectomy and radiation at   that time.   ° Patient Stated Goals See if my arm is ok   ° Currently in Pain? No/denies   °  °  °  ° ° ° ° ° ° ° ° ° ° ° ° ° ° ° ° ° ° ° ° [facility] Adult PT Treatment/Exercise - 05/09/20 0001   °  ° Manual Therapy  ° Manual Lymphatic Drainage (MLD) In supine: short neck, superficial and deep abdominals, bil shoulder collectors, bil axillary and R inguinal nodes, anterior inter-axillary and R axillo-ingiunal anastomosis, Then focus on the Rt chest towards all 3 node locations.     ° Passive ROM PROM into flexion and abduction/ER without pull/stretch today   °  °  °   ° ° ° ° ° ° ° ° ° ° ° ° ° ° PT Long Term Goals - 04/16/20 1221   °  ° PT LONG TERM GOAL #1  ° Title Patient will demonstrate she has regained full shoulder ROM and function post operatively compared to baselines.   ° Time 4   ° Period Weeks   ° Status On-going   ° Target Date 05/14/20   °  ° PT LONG TERM GOAL #2  ° Title Patient will increase right shoulder flexion to >/= 135 degrees for increased ease reaching overhead.   ° Baseline 135 pre-op; 123 post op   ° Time 4   ° Period Weeks   ° Status New   ° Target Date 05/14/20   °  ° PT LONG TERM GOAL #3  ° Title Patient will increase right shoulder abduction to >/= 150 degrees for increased ease reaching overhead.   ° Baseline 133 post op; 152 pre-op   ° Time 4   ° Period Weeks   ° Status New   ° Target Date 05/14/20   °  ° PT LONG TERM GOAL #4  ° Title Patient will report >/= 50% decrease in right chest edema for increase comfort.   ° Time 4   ° Period Weeks   ° Status New   ° Target Date 05/14/20   °  ° PT LONG TERM GOAL #5  ° Title Patient will verbalize good understanding of risk reduction practices for lymphedema.   ° Time 4   ° Period Weeks   ° Status New   ° Target Date 05/14/20   °  °  °  ° ° ° ° ° ° ° ° Plan - 05/09/20 1444   ° Clinical Impression Statement Less flow of lateral fluid into the inferior incision with pressure going about half way across the incision vs completely across demonstrating less edema.  No pull felt with PROM today.   ° PT Frequency 2x / week   ° PT Duration 4 weeks   ° PT Treatment/Interventions ADLs/Self Care Home Management;Therapeutic exercise;Patient/family education;Manual techniques;Manual lymph drainage;Passive range of motion;Scar mobilization   ° PT Next Visit Plan cont Rt chest/seroma MLD; continue with PROM right shoulder to increase shoulder flexion and abduction. Increase exercises for home fot the R shoulder   ° PT Home Exercise Plan Post op shoulder ROM HEP, self MLD   ° Consulted and Agree with Plan of Care Patient    °  °  °  ° ° °Patient will benefit from skilled therapeutic intervention in order to improve the following deficits and impairments:  Postural dysfunction, Decreased range of motion, Impaired UE functional use, Pain, Decreased knowledge of precautions, Increased fascial restricitons, Increased edema ° °  Visit Diagnosis: Malignant neoplasm of upper-outer quadrant of right breast in female, estrogen receptor positive ([place])  Abnormal posture  Aftercare following surgery for neoplasm  Localized edema  Stiffness of right shoulder, not elsewhere classified     Problem List Patient Active Problem List   Diagnosis Date Noted   Genetic testing 02/03/2020   Family history of breast cancer    Family history of skin cancer    Family history of brain cancer    Family history of lung cancer    Family history of multiple myeloma    Malignant neoplasm of upper-outer quadrant of right breast in female, estrogen receptor positive ([place]) 01/04/2020   Melanoma of skin ([place]) 08/15/2019   Failed total hip arthroplasty, 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

## 2020-05-14 DIAGNOSIS — J3081 Allergic rhinitis due to animal (cat) (dog) hair and dander: Secondary | ICD-10-CM | POA: Diagnosis not present

## 2020-05-14 DIAGNOSIS — J3089 Other allergic rhinitis: Secondary | ICD-10-CM | POA: Diagnosis not present

## 2020-05-14 DIAGNOSIS — J301 Allergic rhinitis due to pollen: Secondary | ICD-10-CM | POA: Diagnosis not present

## 2020-05-15 ENCOUNTER — Inpatient Hospital Stay: Payer: Medicare Other

## 2020-05-15 ENCOUNTER — Other Ambulatory Visit: Payer: Self-pay

## 2020-05-15 ENCOUNTER — Inpatient Hospital Stay: Payer: Medicare Other | Admitting: Adult Health

## 2020-05-15 ENCOUNTER — Encounter: Payer: Self-pay | Admitting: Adult Health

## 2020-05-15 VITALS — BP 140/65 | HR 55 | Temp 98.3°F | Resp 18 | Ht 64.0 in | Wt 151.5 lb

## 2020-05-15 DIAGNOSIS — C50111 Malignant neoplasm of central portion of right female breast: Secondary | ICD-10-CM | POA: Diagnosis not present

## 2020-05-15 DIAGNOSIS — Z95828 Presence of other vascular implants and grafts: Secondary | ICD-10-CM

## 2020-05-15 DIAGNOSIS — L738 Other specified follicular disorders: Secondary | ICD-10-CM | POA: Diagnosis not present

## 2020-05-15 DIAGNOSIS — Z170 Estrogen receptor positive status [ER+]: Secondary | ICD-10-CM

## 2020-05-15 DIAGNOSIS — Z803 Family history of malignant neoplasm of breast: Secondary | ICD-10-CM | POA: Diagnosis not present

## 2020-05-15 DIAGNOSIS — C50411 Malignant neoplasm of upper-outer quadrant of right female breast: Secondary | ICD-10-CM

## 2020-05-15 DIAGNOSIS — Z780 Asymptomatic menopausal state: Secondary | ICD-10-CM | POA: Diagnosis not present

## 2020-05-15 DIAGNOSIS — Z86000 Personal history of in-situ neoplasm of breast: Secondary | ICD-10-CM | POA: Diagnosis not present

## 2020-05-15 DIAGNOSIS — R5383 Other fatigue: Secondary | ICD-10-CM | POA: Diagnosis not present

## 2020-05-15 DIAGNOSIS — Z801 Family history of malignant neoplasm of trachea, bronchus and lung: Secondary | ICD-10-CM | POA: Diagnosis not present

## 2020-05-15 DIAGNOSIS — R040 Epistaxis: Secondary | ICD-10-CM | POA: Diagnosis not present

## 2020-05-15 DIAGNOSIS — Z808 Family history of malignant neoplasm of other organs or systems: Secondary | ICD-10-CM | POA: Diagnosis not present

## 2020-05-15 DIAGNOSIS — Z86006 Personal history of melanoma in-situ: Secondary | ICD-10-CM | POA: Diagnosis not present

## 2020-05-15 DIAGNOSIS — Z9011 Acquired absence of right breast and nipple: Secondary | ICD-10-CM | POA: Diagnosis not present

## 2020-05-15 DIAGNOSIS — Z923 Personal history of irradiation: Secondary | ICD-10-CM | POA: Diagnosis not present

## 2020-05-15 DIAGNOSIS — Z5111 Encounter for antineoplastic chemotherapy: Secondary | ICD-10-CM | POA: Diagnosis not present

## 2020-05-15 LAB — CBC WITH DIFFERENTIAL/PLATELET
Abs Immature Granulocytes: 0.02 10*3/uL (ref 0.00–0.07)
Basophils Absolute: 0.1 10*3/uL (ref 0.0–0.1)
Basophils Relative: 1 %
Eosinophils Absolute: 0.1 10*3/uL (ref 0.0–0.5)
Eosinophils Relative: 3 %
HCT: 33.6 % — ABNORMAL LOW (ref 36.0–46.0)
Hemoglobin: 11.3 g/dL — ABNORMAL LOW (ref 12.0–15.0)
Immature Granulocytes: 1 %
Lymphocytes Relative: 33 %
Lymphs Abs: 1.4 10*3/uL (ref 0.7–4.0)
MCH: 30.4 pg (ref 26.0–34.0)
MCHC: 33.6 g/dL (ref 30.0–36.0)
MCV: 90.3 fL (ref 80.0–100.0)
Monocytes Absolute: 0.4 10*3/uL (ref 0.1–1.0)
Monocytes Relative: 9 %
Neutro Abs: 2.2 10*3/uL (ref 1.7–7.7)
Neutrophils Relative %: 53 %
Platelets: 381 10*3/uL (ref 150–400)
RBC: 3.72 MIL/uL — ABNORMAL LOW (ref 3.87–5.11)
RDW: 13.1 % (ref 11.5–15.5)
WBC: 4.2 10*3/uL (ref 4.0–10.5)
nRBC: 0 % (ref 0.0–0.2)

## 2020-05-15 LAB — COMPREHENSIVE METABOLIC PANEL
ALT: 22 U/L (ref 0–44)
AST: 14 U/L — ABNORMAL LOW (ref 15–41)
Albumin: 3.6 g/dL (ref 3.5–5.0)
Alkaline Phosphatase: 64 U/L (ref 38–126)
Anion gap: 8 (ref 5–15)
BUN: 11 mg/dL (ref 8–23)
CO2: 27 mmol/L (ref 22–32)
Calcium: 9.3 mg/dL (ref 8.9–10.3)
Chloride: 106 mmol/L (ref 98–111)
Creatinine, Ser: 0.8 mg/dL (ref 0.44–1.00)
GFR calc Af Amer: >60 mL/min (ref >60)
GFR calc non Af Amer: >60 mL/min (ref >60)
Glucose, Bld: 100 mg/dL — ABNORMAL HIGH (ref 70–99)
Potassium: 3.4 mmol/L — ABNORMAL LOW (ref 3.5–5.1)
Sodium: 141 mmol/L (ref 135–145)
Total Bilirubin: 0.6 mg/dL (ref 0.3–1.2)
Total Protein: 7 g/dL (ref 6.5–8.1)

## 2020-05-15 MED ORDER — SODIUM CHLORIDE 0.9 % IV SOLN: 4.0000 mg | Freq: Once | INTRAVENOUS | Status: DC

## 2020-05-15 MED ORDER — DEXAMETHASONE SODIUM PHOSPHATE 10 MG/ML IJ SOLN
INTRAMUSCULAR | Status: AC
  Filled 2020-05-15: qty 1

## 2020-05-15 MED ORDER — SODIUM CHLORIDE 0.9 % IV SOLN
Freq: Once | INTRAVENOUS | Status: AC
  Filled 2020-05-15: qty 250

## 2020-05-15 MED ORDER — DIPHENHYDRAMINE HCL 50 MG/ML IJ SOLN
25.0000 mg | Freq: Once | INTRAMUSCULAR | Status: AC
  Administered 2020-05-15: 25 mg via INTRAVENOUS

## 2020-05-15 MED ORDER — HEPARIN SOD (PORK) LOCK FLUSH 100 UNIT/ML IV SOLN
500.0000 [IU] | Freq: Once | INTRAVENOUS | Status: AC | PRN
  Administered 2020-05-15: 500 [IU]
  Filled 2020-05-15: qty 5

## 2020-05-15 MED ORDER — SODIUM CHLORIDE 0.9% FLUSH
10.0000 mL | INTRAVENOUS | Status: DC | PRN
  Administered 2020-05-15: 10 mL
  Filled 2020-05-15: qty 10

## 2020-05-15 MED ORDER — FAMOTIDINE IN NACL 20-0.9 MG/50ML-% IV SOLN
INTRAVENOUS | Status: AC
  Filled 2020-05-15: qty 50

## 2020-05-15 MED ORDER — SODIUM CHLORIDE 0.9 % IV SOLN
80.0000 mg/m2 | Freq: Once | INTRAVENOUS | Status: AC
  Administered 2020-05-15: 144 mg via INTRAVENOUS
  Filled 2020-05-15: qty 24

## 2020-05-15 MED ORDER — SODIUM CHLORIDE 0.9% FLUSH
10.0000 mL | Freq: Once | INTRAVENOUS | Status: AC
  Administered 2020-05-15: 10 mL
  Filled 2020-05-15: qty 10

## 2020-05-15 MED ORDER — TRASTUZUMAB-DKST CHEMO 150 MG IV SOLR
150.0000 mg | Freq: Once | INTRAVENOUS | Status: AC
  Administered 2020-05-15: 150 mg via INTRAVENOUS
  Filled 2020-05-15: qty 7.14

## 2020-05-15 MED ORDER — FAMOTIDINE IN NACL 20-0.9 MG/50ML-% IV SOLN
20.0000 mg | Freq: Once | INTRAVENOUS | Status: AC
  Administered 2020-05-15: 20 mg via INTRAVENOUS

## 2020-05-15 MED ORDER — ACETAMINOPHEN 325 MG PO TABS
650.0000 mg | ORAL_TABLET | Freq: Once | ORAL | Status: AC
  Administered 2020-05-15: 650 mg via ORAL

## 2020-05-15 MED ORDER — DEXAMETHASONE SODIUM PHOSPHATE 10 MG/ML IJ SOLN
4.0000 mg | Freq: Once | INTRAMUSCULAR | Status: AC
  Administered 2020-05-15: 4 mg via INTRAVENOUS

## 2020-05-15 MED ORDER — DIPHENHYDRAMINE HCL 50 MG/ML IJ SOLN
INTRAMUSCULAR | Status: AC
  Filled 2020-05-15: qty 1

## 2020-05-15 MED ORDER — ACETAMINOPHEN 325 MG PO TABS
ORAL_TABLET | ORAL | Status: AC
  Filled 2020-05-15: qty 2

## 2020-05-15 NOTE — Patient Instructions (Signed)

## 2020-05-15 NOTE — Progress Notes (Signed)
[place]  Telephone:([phone]) [phone] Fax:(336) [phone]     ID: [name] DOB: 11-22-1951  MR#: [id number]  LDJ#:[id number]  Patient Care Team: [name], MD as PCP - General [name], [name], MD as Consulting Physician (Allergy and Immunology) [name], OD as Referring Physician [name], RN as Oncology Nurse Navigator [name], RN as Oncology Nurse Navigator [name], MD as Consulting Physician (General Surgery) [name], [name], MD as Consulting Physician (Oncology) [name], MD as Attending Physician (Radiation Oncology) [name], MD as Referring Physician (Dermatology) [name], MD as Consulting Physician (Cardiology) Scot Dock, NP OTHER MD:  CHIEF COMPLAINT: triple positive breast cancer (s/p right mastectomy)  CURRENT TREATMENT: weekly paclitaxel x12; trastuzumab to complete a year   INTERVAL HISTORY: [name]" returns today for follow up and treatment of her triple positive breast cancer accompanied by her husband [place].   She started on weekly paclitaxel and trastuzumab on 04/24/2020. Today is week 4.   Her echocardiogram 01/17/2020 showed an ejection fraction in the 60-65% range.  Since she only just started her chemo she will not need a repeat echo until late August or early September.     REVIEW OF SYSTEMS: Theresa Bradley is doing moderately well today.  She notes her issue this week has been her scalp.  She has noted a rash and scalp discomfort.  She wonders what this is from.  She is having normal bowel movements, at an increased frequency, however these are accompanied with extreme urgency.  She notes difficulty sleeping the night after her treatment.  She denies peripheral neuropathy.    Theresa Bradley has no nausea or vomiting.  She has no fever chills, chest pain, palpitations, cough, shortness of breath.  She says her taste is off and her appetite is decreased secondary to this.     Theresa Bradley is fatigued.  She is able to walk three days the past week.  The other days she was unable to make that happen.  She did have one nose bleed this week.  This lasted about 5 minutes.     HISTORY OF CURRENT ILLNESS: From the original intake note:   Theresa Bradley was my patient a little over 11 years ago when she underwent lumpectomy on 05/15/2008 for ductal carcinoma in situ and received radiation therapy under Dr. Valere Dross. 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  More recently, she presented to her PCP with a small palpable right breast lump around 11 o'clock, just superior and lateral to the nipple. She underwent bilateral diagnostic mammography with tomography and right breast ultrasonography at [facility] on 12/26/2019 showing: breast density category B; 6 mm superficial mass in the right breast at 11 o'clock in the retroareolar region; no enlarged adenopathy in right axilla.  Accordingly on 12/30/2019 she proceeded to biopsy of the right breast area in question. The pathology from this procedure ([id number]) showed: invasive ductal carcinoma, grade 2. Prognostic indicators significant for: estrogen receptor, 95% positive and progesterone receptor, 80% positive, both with strong staining intensity. Proliferation marker Ki67 at 20%. HER2 positive by immunohistochemistry (3+).  The patient's subsequent history is as detailed below.   PAST MEDICAL HISTORY: Past Medical History:  Diagnosis Date   Anxiety    Breast cancer ([place]) 2009   right   Cancer ([place])    COUGH,  CHRONIC 05/10/2010   Dysrhythmia    occationally "skips a beat"   Family history of brain cancer    Family history of breast cancer    Family history of lung cancer    Family history of multiple myeloma    Family history of skin cancer    GERD (gastroesophageal reflux disease)    HYPERTENSION  04/16/2009   HYPOTHYROIDISM 04/16/2009   OSTEOARTHRITIS 04/16/2009   Personal history of radiation therapy    SENILE LENTIGO 06/10/2010    PAST SURGICAL HISTORY: Past Surgical History:  Procedure Laterality Date   BREAST EXCISIONAL BIOPSY Right    BREAST EXCISIONAL BIOPSY Left    BREAST LUMPECTOMY Right 2009   radiation only   BREAST LUMPECTOMY WITH RADIOACTIVE SEED AND SENTINEL LYMPH NODE BIOPSY Right 01/31/2020   Procedure: RIGHT BREAST LUMPECTOMY WITH RADIOACTIVE SEED AND SENTINEL LYMPH NODE BIOPSY;  Surgeon: [name], MD;  Location: [facility];  Service: General;  Laterality: Right;  [place]  2009   X 2, cancer stage 0, lumpectomy   CESAREAN SECTION     1979, [place]  11/2015   HERNIA REPAIR  2009   JOINT REPLACEMENT  2008   hip   OPEN SURGICAL REPAIR OF GLUTEAL TENDON Left 07/18/2019   Procedure: Left hip bearing surface revision with gluteal tendon repair;  Surgeon: Gaynelle Arabian, MD;  Location: [facility];  Service: Orthopedics;  Laterality: Left;  2 hrs   PORTACATH PLACEMENT N/A 03/15/2020   Procedure: INSERTION PORT-A-CATH WITH ULTRASOUND GUIDANCE;  Surgeon: [name], MD;  Location: [facility];  Service: General;  Laterality: N/A;   SIMPLE MASTECTOMY WITH AXILLARY SENTINEL NODE BIOPSY Right 03/15/2020   Procedure: RIGHT SIMPLE MASTECTOMY;  Surgeon: [name], MD;  Location: [facility];  Service: General;  Laterality: Right;    FAMILY HISTORY: Family History  Problem Relation Age of Onset   Arthritis Other    Hypertension Other    Cancer Other        2 aunts   Hyperlipidemia Father    Dementia Father    Skin cancer Father    Skin cancer Mother    Breast cancer Maternal Aunt 61   Breast cancer Maternal Aunt        dx. mid-70s   Lung cancer Paternal Uncle    Multiple myeloma Paternal Uncle     Cancer Paternal Aunt        unknown type, dx. >50   Brain cancer Cousin        dx. 15s, paternal cousin   The patient's father is 71 years old and the patient's mother is 71 years old as of February 2021.  The patient has one brother, no sisters.  A maternal aunt was diagnosed with breast cancer at age 71. with breast cancer at age 64.  A paternal uncle was diagnosed with lung cancer in his 9s and another paternal uncle with a "blood cancer" in his late 39s.  There is no history of ovarian pancreatic or prostate cancer in the family to her knowledge.   GYNECOLOGIC HISTORY:  No LMP recorded. Patient is postmenopausal. Menarche: 69 years old Age at first live birth: 69 years old [place] P 2 LMP HRT no  Hysterectomy?  No BSO?  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  Her  husband Patrick Jupiter runs and environmental company that for example checks for mold and then remediate.  Son Delfino Lovett, 66 years old, lives in [place] and works as a Clinical biochemist.  He has 2 sons.  The patient's son Catalina Antigua 67 lives in [place] and works in Chief Executive Officer.  He has no children.  The patient is Episcopalian     ADVANCED DIRECTIVES: In the absence of any documentation to the contrary, the patient's spouse is their HCPOA.    HEALTH MAINTENANCE: Social History   Tobacco Use   Smoking status: Never Smoker   Smokeless tobacco: Never Used   Tobacco comment: father smoked when she was younger   Vaping Use   Vaping Use: Never used  Substance Use Topics   Alcohol use: Yes    Comment: occasionally    Drug use: No     Colonoscopy: 03/2009/[place] [facility]  PAP: 10/2013, negative  Bone density: 11/2015, T score -1.7   Allergies  Allergen Reactions   Codeine Sulfate Nausea And Vomiting   Erythromycin Base Other (See Comments)    Stomach ache   Esomeprazole Magnesium     REACTION: GI upset    Current Outpatient Medications    Medication Sig Dispense Refill   ALPRAZolam (XANAX) 0.25 MG tablet TAKE 1/2 TABLET BY MOUTH AT BEDTIME AS NEEDED INSOMNIA 45 tablet 0   amLODipine (NORVASC) 5 MG tablet Take 1 tablet (5 mg total) by mouth daily. 90 tablet 3   BIOTIN 5000 PO Take by mouth.     Calcium Carbonate-Vitamin D 600-200 MG-UNIT TABS Take 1 tablet by mouth daily.     citalopram (CELEXA) 20 MG tablet Take 1 tablet (20 mg total) by mouth daily. 90 tablet 3   docusate sodium (COLACE) 100 MG capsule Take 100 mg by mouth daily as needed for mild constipation.      EPIPEN 2-PAK 0.3 MG/0.3ML SOAJ injection Inject 0.3 mg into the muscle as needed for anaphylaxis.      famotidine (PEPCID) 10 MG tablet Take 10 mg by mouth daily.      fluocinonide gel (LIDEX) 0.05 %      levothyroxine (SYNTHROID) 50 MCG tablet TAKE 1 TABLET BY MOUTH EVERY DAY 90 tablet 1   lidocaine-prilocaine (EMLA) cream Apply to affected area once 30 g 3   magic mouthwash SOLN Take 5 mLs by mouth 4 (four) times daily as needed for mouth pain. 240 mL 1   meloxicam (MOBIC) 15 MG tablet TAKE 1 TABLET BY MOUTH EVERY DAY 90 tablet 1   mupirocin ointment (BACTROBAN) 2 % APPLY TO AFFECTED AREA EVERY DAY     oxyCODONE (OXY IR/ROXICODONE) 5 MG immediate release tablet Take 1 tablet (5 mg total) by mouth every 6 (six) hours as needed for severe pain. 15 tablet 0   prochlorperazine (COMPAZINE) 10 MG tablet Take 1 tablet (10 mg total) by mouth every 6 (six) hours as needed (Nausea or vomiting). 30 tablet 1   tretinoin (RETIN-A) 0.025 % cream Apply topically at bedtime. 45 g 6   No current facility-administered medications for this visit.    OBJECTIVE:   Vitals:   05/15/20 0852  BP: 140/65  Pulse: (!) 55  Resp: 18  Temp: 98.3 F (36.8 C)  SpO2: 100%     Body mass index is 26 kg/m.   Wt Readings from Last 3 Encounters:  05/15/20 151 lb 8 oz (68.7 kg)  05/08/20 151 lb 8 oz (68.7 kg)  04/24/20 155 lb 4.8 oz (70.4 kg)  ECOG FS:1 -  Symptomatic but completely ambulatory GENERAL: Patient is a well appearing female in no acute distress HEENT:  Sclerae anicteric.Neck is supple.  NODES:  No cervical, supraclavicular, or axillary lymphadenopathy palpated.  BREAST EXAM:  Deferred. LUNGS:  Clear to auscultation bilaterally.  No wheezes or rhonchi. HEART:  Regular rate and rhythm. No murmur appreciated. ABDOMEN:  Soft, nontender.  Positive, normoactive bowel sounds. No organomegaly palpated. MSK:  No focal spinal tenderness to palpation. Full range of motion bilaterally in the upper extremities. EXTREMITIES:  No peripheral edema.   SKIN: scalp with small areas of erythematous pustules at base of hair follicle noted on right lateral scalp NEURO:  Nonfocal. Well oriented.  Appropriate affect.    LAB RESULTS:  CMP     Component Value Date/Time   NA 142 05/08/2020 0840   K 3.5 05/08/2020 0840   CL 106 05/08/2020 0840   CO2 27 05/08/2020 0840   GLUCOSE 112 (H) 05/08/2020 0840   BUN 12 05/08/2020 0840   CREATININE 0.80 05/08/2020 0840   CREATININE 0.81 01/11/2020 1209   CALCIUM 9.2 05/08/2020 0840   PROT 7.0 05/08/2020 0840   ALBUMIN 3.6 05/08/2020 0840   AST 15 05/08/2020 0840   AST 14 (L) 01/11/2020 1209   ALT 23 05/08/2020 0840   ALT 13 01/11/2020 1209   ALKPHOS 67 05/08/2020 0840   BILITOT 0.8 05/08/2020 0840   BILITOT 0.5 01/11/2020 1209   GFRNONAA >60 05/08/2020 0840   GFRNONAA >60 01/11/2020 1209   GFRAA >60 05/08/2020 0840   GFRAA >60 01/11/2020 1209    No results found for: TOTALPROTELP, ALBUMINELP, A1GS, A2GS, BETS, BETA2SER, GAMS, MSPIKE, SPEI  Lab Results  Component Value Date   WBC 4.2 05/15/2020   NEUTROABS 2.2 05/15/2020   HGB 11.3 (L) 05/15/2020   HCT 33.6 (L) 05/15/2020   MCV 90.3 05/15/2020   PLT 381 05/15/2020    No results found for: LABCA2  No components found for: VPXTGG269  No results for input(s): INR in the last 168 hours.  No results found for: LABCA2  No results found  for: SWN462  No results found for: VOJ500  No results found for: XFG182  No results found for: CA2729  No components found for: HGQUANT  No results found for: CEA1 / No results found for: CEA1   No results found for: AFPTUMOR  No results found for: CHROMOGRNA  No results found for: KPAFRELGTCHN, LAMBDASER, KAPLAMBRATIO (kappa/lambda light chains)  No results found for: HGBA, HGBA2QUANT, HGBFQUANT, HGBSQUAN (Hemoglobinopathy evaluation)   No results found for: LDH  No results found for: IRON, TIBC, IRONPCTSAT (Iron and TIBC)  No results found for: FERRITIN  Urinalysis    Component Value Date/Time   COLORURINE yellow 05/24/2010 0855   APPEARANCEUR Clear 05/24/2010 0855   LABSPEC 1.015 05/24/2010 0855   PHURINE 6.0 05/24/2010 0855   GLUCOSEU NEGATIVE 09/22/2007 1110   HGBUR negative 05/24/2010 0855   BILIRUBINUR n 06/29/2019 1533   KETONESUR NEGATIVE 09/22/2007 1110   PROTEINUR Negative 06/29/2019 [place] 09/22/2007 1110   UROBILINOGEN 0.2 06/29/2019 1533   UROBILINOGEN 0.2 05/24/2010 0855   NITRITE n 06/29/2019 1533   NITRITE negative 05/24/2010 0855   LEUKOCYTESUR Negative 06/29/2019 1533     STUDIES: No results found.   ELIGIBLE FOR AVAILABLE RESEARCH PROTOCOL: no  ASSESSMENT: 69 y.o. [place] woman status post right breast periareolar biopsy 12/30/2019 for a clinical T1a N0, stage I invasive ductal carcinoma, grade 2, estrogen and progesterone  receptor positive, HER-2 amplified, with an MIB-one of 20%.  (1) history of prior right lumpectomy June 2009 for ductal carcinoma in situ  (a) status post adjuvant radiation  (b) did not receive antiestrogens  (2) status post repeat right lumpectomy and sentinel lymph node sampling 01/31/2020 for a pT1c pN0, stage IA invasive ductal carcinoma, grade 2, with positive margins  (a) total one sentinel node removed  (3) s/p right mastectomy 03/15/2020 showing residual invasive ductal carcinoma  measuring 0.9 cm, but negative margins (added to prior 1.1 lumpectomy, still T1 N0 or stage IA)  (a) one additional axillary node was removed (total 2 right axillary nodes removed)  (3) adjuvant chemotherapy with paclitaxel and trastuzumab weekly x 12 started 04/24/2020 04/17/2020  (4) trastuzumab to be continued every 21 days to total one year  (a) echo 01/17/2020 shows an ejection fraction in the 60-65% range  (5) to start antiestrogens at the completion of local treatment.  (6) genetics testing 02/01/2020 through the [facility] STAT panel found no deleterious mutations then ATM, BRCA1, BRCA2, CDH1, CHEK2, PALB2, PTEN, STK11 and TP53.    PLAN: Bhakti continues on weekly Paclitaxel and Trastuzumab with good tolerance.  She has had some mild issues that we can help to alleviate.    1. Folliculitis: I recommended warm compresses to her scalp BID followed by applications of Bactroban ointment.  Should this not improve by Thursday afternoon, she will call me and I will send in [place] for her to use.    2. Fecal urgency: She is not having diarrhea.  I suggested she try mylanta to help soothe the stomach, and also eat some yogurt.    3.  Epistaxis: This has happened once, and resolved shortly thereafter.  With her losing her hair, and her chemotherapy, it isn't uncommon for these to happen occasionally.  She already uses saline nasal spray, and I recommended she continue this.  I also recommended she avoid blowing her nose.    4. Fatigue: She continues to walk.  She understands that this can be cumulative, and I encouraged her to continue to do what she can.  She is doing good about this.  [name] overall is doing quite well.  We are monitoring her closely for peripheral neuropathy which she has thankfully not developed at this point.  She will return weekly for her Trastuzumab and Paclitaxel and will see [place] with every other treatment.    She knows to call for any questions that  may arise between now and her next appointment.  We are happy to see her sooner if needed.   Total encounter time 30 minutes.Wilber Bihari, NP 05/15/20 9:13 AM Medical Oncology and Hematology [facility] [place], [place] [address] Tel. [phone]    Fax. [phone]   *Total Encounter Time as defined by the Centers for Medicare and Medicaid Services includes, in addition to the face-to-face time of a patient visit (documented in the note above) non-face-to-face time: obtaining and reviewing outside history, ordering and reviewing medications, tests or procedures, care coordination (communications with other health care professionals or caregivers) and documentation in the medical record.

## 2020-05-15 NOTE — Patient Instructions (Signed)
[facility] Discharge Instructions for Patients Receiving Chemotherapy  Today you received the following chemotherapy agents Trastuzumab and Taxol  To help prevent nausea and vomiting after your treatment, we encourage you to take your nausea medication as directed.    If you develop nausea and vomiting that is not controlled by your nausea medication, call the clinic.   BELOW ARE SYMPTOMS THAT SHOULD BE REPORTED IMMEDIATELY:  *FEVER GREATER THAN 100.5 F  *CHILLS WITH OR WITHOUT FEVER  NAUSEA AND VOMITING THAT IS NOT CONTROLLED WITH YOUR NAUSEA MEDICATION  *UNUSUAL SHORTNESS OF BREATH  *UNUSUAL BRUISING OR BLEEDING  TENDERNESS IN MOUTH AND THROAT WITH OR WITHOUT PRESENCE OF ULCERS  *URINARY PROBLEMS  *BOWEL PROBLEMS  UNUSUAL RASH Items with * indicate a potential emergency and should be followed up as soon as possible.  Feel free to call the clinic should you have any questions or concerns. The clinic phone number is [phone].  Please show the CHEMO ALERT CARD at check-in to the Emergency Department and triage nurse.   

## 2020-05-16 ENCOUNTER — Encounter: Payer: Self-pay | Admitting: Rehabilitation

## 2020-05-16 ENCOUNTER — Telehealth: Payer: Self-pay | Admitting: Adult Health

## 2020-05-16 ENCOUNTER — Ambulatory Visit: Payer: Medicare Other | Admitting: Rehabilitation

## 2020-05-16 DIAGNOSIS — R293 Abnormal posture: Secondary | ICD-10-CM | POA: Diagnosis not present

## 2020-05-16 DIAGNOSIS — Z483 Aftercare following surgery for neoplasm: Secondary | ICD-10-CM | POA: Diagnosis not present

## 2020-05-16 DIAGNOSIS — C50411 Malignant neoplasm of upper-outer quadrant of right female breast: Secondary | ICD-10-CM

## 2020-05-16 DIAGNOSIS — R600 Localized edema: Secondary | ICD-10-CM | POA: Diagnosis not present

## 2020-05-16 DIAGNOSIS — M25611 Stiffness of right shoulder, not elsewhere classified: Secondary | ICD-10-CM | POA: Diagnosis not present

## 2020-05-16 DIAGNOSIS — Z170 Estrogen receptor positive status [ER+]: Secondary | ICD-10-CM | POA: Diagnosis not present

## 2020-05-16 NOTE — Telephone Encounter (Signed)
Scheduled appts per 6/22 LOS. Pt to get updated appt calendar at next appt per 6/29 appt note.

## 2020-05-16 NOTE — Therapy (Signed)
[place], [place], [address] Phone: [phone]   Fax:  [phone]  Physical Therapy Treatment  Patient Details  Name: Theresa Bradley MRN: [id number] Date of Birth: Apr 18, 1951 Referring Provider (PT): Dr. [name]   Encounter Date: 05/16/2020   PT End of Session - 05/16/20 1557    Visit Number 7    Number of Visits 11    Date for PT Re-Evaluation 06/13/20    PT Start Time 1500    PT Stop Time 1554    PT Time Calculation (min) 54 min    Activity Tolerance Patient tolerated treatment well    Behavior During Therapy [facility] for tasks assessed/performed           Past Medical History:  Diagnosis Date  . Anxiety   . Breast cancer ([place]) 2009   right  . Cancer ([place])   . COUGH, CHRONIC 05/10/2010  . Dysrhythmia    occationally "skips a beat"  . Family history of brain cancer   . Family history of breast cancer   . Family history of lung cancer   . Family history of multiple myeloma   . Family history of skin cancer   . GERD (gastroesophageal reflux disease)   . HYPERTENSION 04/16/2009  . HYPOTHYROIDISM 04/16/2009  . OSTEOARTHRITIS 04/16/2009  . Personal history of radiation therapy   . SENILE LENTIGO 06/10/2010    Past Surgical History:  Procedure Laterality Date  . BREAST EXCISIONAL BIOPSY Right   . BREAST EXCISIONAL BIOPSY Left   . BREAST LUMPECTOMY Right 2009   radiation only  . BREAST LUMPECTOMY WITH RADIOACTIVE SEED AND SENTINEL LYMPH NODE BIOPSY Right 01/31/2020   Procedure: RIGHT BREAST LUMPECTOMY WITH RADIOACTIVE SEED AND SENTINEL LYMPH NODE BIOPSY;  Surgeon: [name], MD;  Location: [place];  Service: General;  Laterality: Right;  PEC BLOCK  . BREAST SURGERY  2009   X 2, cancer stage 0, lumpectomy  . [place]  . CHOLECYSTECTOMY    . DILATATION & CURETTAGE/HYSTEROSCOPY WITH MYOSURE  11/2015  . HERNIA REPAIR  2009  . JOINT  REPLACEMENT  2008   hip  . OPEN SURGICAL REPAIR OF GLUTEAL TENDON Left 07/18/2019   Procedure: Left hip bearing surface revision with gluteal tendon repair;  Surgeon: Gaynelle Arabian, MD;  Location: [facility];  Service: Orthopedics;  Laterality: Left;  2 hrs  . PORTACATH PLACEMENT N/A 03/15/2020   Procedure: INSERTION PORT-A-CATH WITH ULTRASOUND GUIDANCE;  Surgeon: [name], MD;  Location: [place];  Service: General;  Laterality: N/A;  . SIMPLE MASTECTOMY WITH AXILLARY SENTINEL NODE BIOPSY Right 03/15/2020   Procedure: RIGHT SIMPLE MASTECTOMY;  Surgeon: [name], MD;  Location: [place];  Service: General;  Laterality: Right;    There were no vitals filed for this visit.   Subjective Assessment - 05/16/20 1506    Subjective I'm not bad today; infusion yesterday. I think it is better in the arm part.    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 Patient underwent a right lumpectomy and sentinel node biopsy (1 negative node) on 01/31/2020 but then underwent a right mastectomy and another negative node removed on 03/15/2020 due to not getting clear margins. Ki67 is 20%. She has a history of a left hip replacement in 2008 and a revision in 06/2019. 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 She underwent a right lumpectomy and radiation at that time.    Currently in Pain? No/denies                             [facility] Adult PT Treatment/Exercise - 05/16/20 0001      Manual Therapy   Edema Management measured seroma with arm 90deg of flexion: seroma borders 7cmx7.4cm with the edema moving around 11cm medially along the incision with moderate medial pressure to the seroma wit hthe arm also at 90deg    Manual Lymphatic Drainage (MLD) In supine: short neck, superficial and deep abdominals, bil shoulder collectors, bil axillary and R inguinal nodes, anterior  inter-axillary and R axillo-ingiunal anastomosis, Then focus on the Rt chest towards all 3 node locations.                         PT Long Term Goals - 05/16/20 1509      PT LONG TERM GOAL #1   Title Patient will demonstrate she has regained full shoulder ROM and function post operatively compared to baselines.    Status Achieved      PT LONG TERM GOAL #2   Title Patient will increase right shoulder flexion to >/= 135 degrees for increased ease reaching overhead.    Baseline 135 pre-op; 123 post op, 152    Time 4    Status Achieved      PT LONG TERM GOAL #3   Title Patient will increase right shoulder abduction to >/= 150 degrees for increased ease reaching overhead.    Baseline 133 post op; 152 pre-op, 6/23: 162    Status Achieved      PT LONG TERM GOAL #4   Title Patient will report >/= 50% decrease in right chest edema    Baseline decrease of 30% as of 05/16/20    Status On-going      PT LONG TERM GOAL #5   Title Patient will verbalize good understanding of risk reduction practices for lymphedema.    Status Achieved                 Plan - 05/16/20 1557    Clinical Impression Statement Pt with decrease in seroma to 7x7.4 with less movement medially into the chest along the incision.  Goals all met except for 50% decrease in chest size.  Will continue POC 1x pwe week until seroma reduction plateaus    PT Frequency 1x / week    PT Duration 4 weeks    PT Treatment/Interventions ADLs/Self Care Home Management;Therapeutic exercise;Patient/family education;Manual techniques;Manual lymph drainage;Passive range of motion;Scar mobilization    PT Next Visit Plan cont Rt chest/seroma MLD; continue with PROM right shoulder to increase shoulder flexion and abduction. Increase exercises for home fot the R shoulder    PT Home Exercise Plan Post op shoulder ROM HEP, self MLD    Consulted and Agree with Plan of Care Patient           Patient will benefit from skilled  therapeutic intervention in order to improve the following deficits and impairments:  Postural dysfunction, Decreased range of motion, Impaired UE functional use, Pain, Decreased knowledge of precautions, Increased fascial restricitons, Increased edema  Visit Diagnosis: Malignant neoplasm of upper-outer quadrant of right breast in female, estrogen receptor positive (HCC)  Abnormal posture  Aftercare following surgery for neoplasm  Localized edema  Stiffness of right shoulder, not  elsewhere classified     Problem List Patient Active Problem List   Diagnosis Date Noted  . Port-A-Cath in place 05/15/2020  . Genetic testing 02/03/2020  . Family history of breast cancer   . Family history of skin cancer   . Family history of brain cancer   . Family history of lung cancer   . Family history of multiple myeloma   . Malignant neoplasm of upper-outer quadrant of right breast in female, estrogen receptor positive ([place]) 01/04/2020  . Melanoma of skin ([employer]) 08/15/2019  . 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 Failed total hip arthroplasty ([place]) 07/18/2019  . Cough 05/31/2014  . GERD (gastroesophageal reflux disease) 09/13/2012  . HYPOTHYROIDISM 04/16/2009  . Essential hypertension 04/16/2009  . Osteoarthritis 04/16/2009    Stark Bray 05/16/2020, 3:59 PM  [place], [place], [address] Phone: [phone]   Fax:  [phone]  Name: Theresa Bradley MRN: [id number] Date of Birth: 03/07/1951

## 2020-05-22 ENCOUNTER — Other Ambulatory Visit: Payer: Self-pay

## 2020-05-22 ENCOUNTER — Inpatient Hospital Stay: Payer: Medicare Other

## 2020-05-22 VITALS — BP 140/85 | HR 61 | Temp 97.9°F | Resp 18 | Wt 149.8 lb

## 2020-05-22 DIAGNOSIS — C50411 Malignant neoplasm of upper-outer quadrant of right female breast: Secondary | ICD-10-CM

## 2020-05-22 DIAGNOSIS — Z95828 Presence of other vascular implants and grafts: Secondary | ICD-10-CM

## 2020-05-22 DIAGNOSIS — R040 Epistaxis: Secondary | ICD-10-CM | POA: Diagnosis not present

## 2020-05-22 DIAGNOSIS — Z86006 Personal history of melanoma in-situ: Secondary | ICD-10-CM | POA: Diagnosis not present

## 2020-05-22 DIAGNOSIS — Z86000 Personal history of in-situ neoplasm of breast: Secondary | ICD-10-CM | POA: Diagnosis not present

## 2020-05-22 DIAGNOSIS — C50111 Malignant neoplasm of central portion of right female breast: Secondary | ICD-10-CM | POA: Diagnosis not present

## 2020-05-22 DIAGNOSIS — Z808 Family history of malignant neoplasm of other organs or systems: Secondary | ICD-10-CM | POA: Diagnosis not present

## 2020-05-22 DIAGNOSIS — Z5111 Encounter for antineoplastic chemotherapy: Secondary | ICD-10-CM | POA: Diagnosis not present

## 2020-05-22 DIAGNOSIS — R5383 Other fatigue: Secondary | ICD-10-CM | POA: Diagnosis not present

## 2020-05-22 DIAGNOSIS — Z803 Family history of malignant neoplasm of breast: Secondary | ICD-10-CM | POA: Diagnosis not present

## 2020-05-22 DIAGNOSIS — Z170 Estrogen receptor positive status [ER+]: Secondary | ICD-10-CM | POA: Diagnosis not present

## 2020-05-22 DIAGNOSIS — Z780 Asymptomatic menopausal state: Secondary | ICD-10-CM | POA: Diagnosis not present

## 2020-05-22 DIAGNOSIS — Z9011 Acquired absence of right breast and nipple: Secondary | ICD-10-CM | POA: Diagnosis not present

## 2020-05-22 DIAGNOSIS — Z923 Personal history of irradiation: Secondary | ICD-10-CM | POA: Diagnosis not present

## 2020-05-22 DIAGNOSIS — Z801 Family history of malignant neoplasm of trachea, bronchus and lung: Secondary | ICD-10-CM | POA: Diagnosis not present

## 2020-05-22 DIAGNOSIS — L738 Other specified follicular disorders: Secondary | ICD-10-CM | POA: Diagnosis not present

## 2020-05-22 LAB — COMPREHENSIVE METABOLIC PANEL
ALT: 25 U/L (ref 0–44)
AST: 15 U/L (ref 15–41)
Albumin: 3.6 g/dL (ref 3.5–5.0)
Alkaline Phosphatase: 62 U/L (ref 38–126)
Anion gap: 7 (ref 5–15)
BUN: 8 mg/dL (ref 8–23)
CO2: 28 mmol/L (ref 22–32)
Calcium: 9.1 mg/dL (ref 8.9–10.3)
Chloride: 105 mmol/L (ref 98–111)
Creatinine, Ser: 0.76 mg/dL (ref 0.44–1.00)
GFR calc Af Amer: >60 mL/min (ref >60)
GFR calc non Af Amer: >60 mL/min (ref >60)
Glucose, Bld: 138 mg/dL — ABNORMAL HIGH (ref 70–99)
Potassium: 3.4 mmol/L — ABNORMAL LOW (ref 3.5–5.1)
Sodium: 140 mmol/L (ref 135–145)
Total Bilirubin: 0.5 mg/dL (ref 0.3–1.2)
Total Protein: 6.8 g/dL (ref 6.5–8.1)

## 2020-05-22 LAB — CBC WITH DIFFERENTIAL/PLATELET
Abs Immature Granulocytes: 0.02 10*3/uL (ref 0.00–0.07)
Basophils Absolute: 0.1 10*3/uL (ref 0.0–0.1)
Basophils Relative: 1 %
Eosinophils Absolute: 0.1 10*3/uL (ref 0.0–0.5)
Eosinophils Relative: 3 %
HCT: 34 % — ABNORMAL LOW (ref 36.0–46.0)
Hemoglobin: 11.3 g/dL — ABNORMAL LOW (ref 12.0–15.0)
Immature Granulocytes: 1 %
Lymphocytes Relative: 33 %
Lymphs Abs: 1.4 10*3/uL (ref 0.7–4.0)
MCH: 30.5 pg (ref 26.0–34.0)
MCHC: 33.2 g/dL (ref 30.0–36.0)
MCV: 91.6 fL (ref 80.0–100.0)
Monocytes Absolute: 0.3 10*3/uL (ref 0.1–1.0)
Monocytes Relative: 8 %
Neutro Abs: 2.2 10*3/uL (ref 1.7–7.7)
Neutrophils Relative %: 54 %
Platelets: 384 10*3/uL (ref 150–400)
RBC: 3.71 MIL/uL — ABNORMAL LOW (ref 3.87–5.11)
RDW: 13.6 % (ref 11.5–15.5)
WBC: 4.1 10*3/uL (ref 4.0–10.5)
nRBC: 0 % (ref 0.0–0.2)

## 2020-05-22 MED ORDER — DEXAMETHASONE SODIUM PHOSPHATE 10 MG/ML IJ SOLN
INTRAMUSCULAR | Status: AC
  Filled 2020-05-22: qty 1

## 2020-05-22 MED ORDER — SODIUM CHLORIDE 0.9 % IV SOLN
Freq: Once | INTRAVENOUS | Status: AC
  Filled 2020-05-22: qty 250

## 2020-05-22 MED ORDER — DIPHENHYDRAMINE HCL 50 MG/ML IJ SOLN
INTRAMUSCULAR | Status: AC
  Filled 2020-05-22: qty 1

## 2020-05-22 MED ORDER — ACETAMINOPHEN 325 MG PO TABS
ORAL_TABLET | ORAL | Status: AC
  Filled 2020-05-22: qty 2

## 2020-05-22 MED ORDER — ACETAMINOPHEN 325 MG PO TABS
650.0000 mg | ORAL_TABLET | Freq: Once | ORAL | Status: AC
  Administered 2020-05-22: 650 mg via ORAL

## 2020-05-22 MED ORDER — FAMOTIDINE IN NACL 20-0.9 MG/50ML-% IV SOLN
20.0000 mg | Freq: Once | INTRAVENOUS | Status: AC
  Administered 2020-05-22: 20 mg via INTRAVENOUS

## 2020-05-22 MED ORDER — FAMOTIDINE IN NACL 20-0.9 MG/50ML-% IV SOLN
INTRAVENOUS | Status: AC
  Filled 2020-05-22: qty 50

## 2020-05-22 MED ORDER — SODIUM CHLORIDE 0.9% FLUSH
10.0000 mL | INTRAVENOUS | Status: DC | PRN
  Administered 2020-05-22: 10 mL
  Filled 2020-05-22: qty 10

## 2020-05-22 MED ORDER — HEPARIN SOD (PORK) LOCK FLUSH 100 UNIT/ML IV SOLN
500.0000 [IU] | Freq: Once | INTRAVENOUS | Status: AC | PRN
  Administered 2020-05-22: 500 [IU]
  Filled 2020-05-22: qty 5

## 2020-05-22 MED ORDER — DEXAMETHASONE SODIUM PHOSPHATE 10 MG/ML IJ SOLN
4.0000 mg | Freq: Once | INTRAMUSCULAR | Status: AC
  Administered 2020-05-22: 4 mg via INTRAVENOUS

## 2020-05-22 MED ORDER — DIPHENHYDRAMINE HCL 50 MG/ML IJ SOLN
12.5000 mg | Freq: Once | INTRAMUSCULAR | Status: AC
  Administered 2020-05-22: 12.5 mg via INTRAVENOUS

## 2020-05-22 MED ORDER — SODIUM CHLORIDE 0.9 % IV SOLN
80.0000 mg/m2 | Freq: Once | INTRAVENOUS | Status: AC
  Administered 2020-05-22: 144 mg via INTRAVENOUS
  Filled 2020-05-22: qty 24

## 2020-05-22 MED ORDER — SODIUM CHLORIDE 0.9% FLUSH
10.0000 mL | Freq: Once | INTRAVENOUS | Status: AC
  Administered 2020-05-22: 10 mL
  Filled 2020-05-22: qty 10

## 2020-05-22 MED ORDER — TRASTUZUMAB-DKST CHEMO 150 MG IV SOLR
150.0000 mg | Freq: Once | INTRAVENOUS | Status: AC
  Administered 2020-05-22: 150 mg via INTRAVENOUS
  Filled 2020-05-22: qty 7.14

## 2020-05-22 NOTE — Patient Instructions (Signed)
[facility] Discharge Instructions for Patients Receiving Chemotherapy  Today you received the following chemotherapy agents: trastuzumab and paclitaxel.  To help prevent nausea and vomiting after your treatment, we encourage you to take your nausea medication as directed.   If you develop nausea and vomiting that is not controlled by your nausea medication, call the clinic.   BELOW ARE SYMPTOMS THAT SHOULD BE REPORTED IMMEDIATELY:  *FEVER GREATER THAN 100.5 F  *CHILLS WITH OR WITHOUT FEVER  NAUSEA AND VOMITING THAT IS NOT CONTROLLED WITH YOUR NAUSEA MEDICATION  *UNUSUAL SHORTNESS OF BREATH  *UNUSUAL BRUISING OR BLEEDING  TENDERNESS IN MOUTH AND THROAT WITH OR WITHOUT PRESENCE OF ULCERS  *URINARY PROBLEMS  *BOWEL PROBLEMS  UNUSUAL RASH Items with * indicate a potential emergency and should be followed up as soon as possible.  Feel free to call the clinic should you have any questions or concerns. The clinic phone number is [phone].  Please show the CHEMO ALERT CARD at check-in to the Emergency Department and triage nurse.   

## 2020-05-24 ENCOUNTER — Other Ambulatory Visit: Payer: Self-pay

## 2020-05-24 ENCOUNTER — Encounter: Payer: Self-pay | Admitting: Physical Therapy

## 2020-05-24 ENCOUNTER — Ambulatory Visit: Payer: Medicare Other | Attending: Surgery | Admitting: Physical Therapy

## 2020-05-24 DIAGNOSIS — Z483 Aftercare following surgery for neoplasm: Secondary | ICD-10-CM | POA: Insufficient documentation

## 2020-05-24 DIAGNOSIS — R600 Localized edema: Secondary | ICD-10-CM

## 2020-05-24 DIAGNOSIS — M25611 Stiffness of right shoulder, not elsewhere classified: Secondary | ICD-10-CM | POA: Diagnosis not present

## 2020-05-24 DIAGNOSIS — R293 Abnormal posture: Secondary | ICD-10-CM | POA: Diagnosis not present

## 2020-05-24 NOTE — Therapy (Addendum)
[place], [place], [address] Phone: [phone]   Fax:  [phone]  Physical Therapy Treatment  Patient Details  Name: Theresa Bradley MRN: [id number] Date of Birth: July 23, 1951 Referring Provider (PT): Dr. [name]   Encounter Date: 05/24/2020   PT End of Session - 05/24/20 1501    Visit Number 8    Number of Visits 11    Date for PT Re-Evaluation 06/13/20    PT Start Time 1406    PT Stop Time 1450    PT Time Calculation (min) 44 min    Activity Tolerance Patient tolerated treatment well    Behavior During Therapy [facility] for tasks assessed/performed           Past Medical History:  Diagnosis Date  . Anxiety   . Breast cancer ([place]) 2009   right  . Cancer ([place])   . COUGH, CHRONIC 05/10/2010  . Dysrhythmia    occationally "skips a beat"  . Family history of brain cancer   . Family history of breast cancer   . Family history of lung cancer   . Family history of multiple myeloma   . Family history of skin cancer   . GERD (gastroesophageal reflux disease)   . HYPERTENSION 04/16/2009  . HYPOTHYROIDISM 04/16/2009  . OSTEOARTHRITIS 04/16/2009  . Personal history of radiation therapy   . SENILE LENTIGO 06/10/2010    Past Surgical History:  Procedure Laterality Date  . BREAST EXCISIONAL BIOPSY Right   . BREAST EXCISIONAL BIOPSY Left   . BREAST LUMPECTOMY Right 2009   radiation only  . BREAST LUMPECTOMY WITH RADIOACTIVE SEED AND SENTINEL LYMPH NODE BIOPSY Right 01/31/2020   Procedure: RIGHT BREAST LUMPECTOMY WITH RADIOACTIVE SEED AND SENTINEL LYMPH NODE BIOPSY;  Surgeon: [name], MD;  Location: [place];  Service: General;  Laterality: Right;  PEC BLOCK  . BREAST SURGERY  2009   X 2, cancer stage 0, lumpectomy  . [place]  . CHOLECYSTECTOMY    . DILATATION & CURETTAGE/HYSTEROSCOPY WITH MYOSURE  11/2015  . HERNIA REPAIR  2009  . JOINT  REPLACEMENT  2008   hip  . OPEN SURGICAL REPAIR OF GLUTEAL TENDON Left 07/18/2019   Procedure: Left hip bearing surface revision with gluteal tendon repair;  Surgeon: Gaynelle Arabian, MD;  [facility];  Service: Orthopedics;  Laterality: Left;  2 hrs  . PORTACATH PLACEMENT N/A 03/15/2020   Procedure: INSERTION PORT-A-CATH WITH ULTRASOUND GUIDANCE;  Surgeon: [name], MD;  Location: [place];  Service: General;  Laterality: N/A;  . SIMPLE MASTECTOMY WITH AXILLARY SENTINEL NODE BIOPSY Right 03/15/2020   Procedure: RIGHT SIMPLE MASTECTOMY;  Surgeon: [name], MD;  Location: [place];  Service: General;  Laterality: Right;    There were no vitals filed for this visit.   Subjective Assessment - 05/24/20 1457    Subjective Pt states she is feeling tired today.  She says her shoulders are doing well.    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 Patient underwent a right lumpectomy and sentinel node biopsy (1 negative node) on 01/31/2020 but then underwent a right mastectomy and another negative node removed on 03/15/2020 due to not getting clear margins. Ki67 is 20%. She has a history of a left hip replacement in 2008 and a revision in 06/2019. 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 She underwent a right lumpectomy and radiation at that time.    Currently in Pain? No/denies                             [facility] Adult PT Treatment/Exercise - 05/24/20 0001      Manual Therapy   Manual Lymphatic Drainage (MLD) In supine: short neck, superficial and deep abdominals, bil shoulder collectors, bil axillary and R inguinal nodes, anterior inter-axillary and R axillo-ingiunal anastomosis, Then focus on the Rt chest towards all 3 node locations.   They do sidelying for posterior interaxillary anastamosis and anterior interaxillary anastamosis, Noticed increase  fullness with fluctuation in seroma in midline chest after pt in left sidelying.  Used mirror to show it to her and she will work on self MLD at [employer] when this occurs                        PT Long Term Goals - 05/16/20 1509      PT LONG TERM GOAL #1   Title Patient will demonstrate she has regained full shoulder ROM and function post operatively compared to baselines.    Status Achieved      PT LONG TERM GOAL #2   Title Patient will increase right shoulder flexion to >/= 135 degrees for increased ease reaching overhead.    Baseline 135 pre-op; 123 post op, 152    Time 4    Status Achieved      PT LONG TERM GOAL #3   Title Patient will increase right shoulder abduction to >/= 150 degrees for increased ease reaching overhead.    Baseline 133 post op; 152 pre-op, 6/23: 162    Status Achieved      PT LONG TERM GOAL #4   Title Patient will report >/= 50% decrease in right chest edema    Baseline decrease of 30% as of 05/16/20    Status On-going      PT LONG TERM GOAL #5   Title Patient will verbalize good understanding of risk reduction practices for lymphedema.    Status Achieved                 Plan - 05/24/20 1501    Clinical Impression Statement Pt continues with soft fluid seroma at anterior chest that is responding to positional change and MLD.  Pt cotinuing to use compression at home as previously instructed    Stability/Clinical Decision Making Stable/Uncomplicated    Rehab Potential Excellent    PT Frequency 1x / week    PT Treatment/Interventions ADLs/Self Care Home Management;Therapeutic exercise;Patient/family education;Manual techniques;Manual lymph drainage;Passive range of motion;Scar mobilization    PT Next Visit Plan cont Rt chest/seroma MLD; continue with PROM right shoulder to increase shoulder flexion and abduction. Increase exercises for home fot the R shoulder  Consider kinesiotape????    Consulted and Agree with Plan of Care Patient            Patient will benefit from skilled therapeutic intervention in order to improve the following deficits and impairments:  Postural dysfunction, Decreased range of motion, Impaired UE functional use, Pain, Decreased knowledge of precautions, Increased fascial restricitons, Increased edema  Visit Diagnosis: Abnormal posture  Aftercare following surgery for neoplasm  Localized edema  Stiffness of right shoulder, not elsewhere classified     Problem List Patient Active Problem List   Diagnosis Date Noted  . Port-A-Cath in place  05/15/2020  . Genetic testing 02/03/2020  . Family history of breast cancer   . Family history of skin cancer   . Family history of brain cancer   . Family history of lung cancer   . Family history of multiple myeloma   . Malignant neoplasm of upper-outer quadrant of right breast in female, estrogen receptor positive ([place]) 01/04/2020  . Melanoma of skin ([place]) 08/15/2019  . 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 Failed total hip arthroplasty ([place]) 07/18/2019  . Cough 05/31/2014  . GERD (gastroesophageal reflux disease) 09/13/2012  . HYPOTHYROIDISM 04/16/2009  . Essential hypertension 04/16/2009  . Osteoarthritis 04/16/2009   Donato Heinz. Owens Shark, PT  Norwood Levo 05/24/2020, 3:04 PM  [place], [place], [address] Phone: [phone]   Fax:  [phone]  Name: KATIEANN HUNGATE MRN: [id number] Date of Birth: 06-16-1951  PHYSICAL THERAPY DISCHARGE SUMMARY  Visits from Start of Care: 8  Current functional level related to goals / functional outcomes: unknown   Remaining deficits: unknown   Education / Equipment: Home management of symptoms, home exercise  Plan: Patient agrees to discharge.  Patient goals were partially met. Patient is being discharged due to not returning since the last visit.  ?????   Donato Heinz. Owens Shark, PT

## 2020-05-25 DIAGNOSIS — J3089 Other allergic rhinitis: Secondary | ICD-10-CM | POA: Diagnosis not present

## 2020-05-25 DIAGNOSIS — J301 Allergic rhinitis due to pollen: Secondary | ICD-10-CM | POA: Diagnosis not present

## 2020-05-25 DIAGNOSIS — K2100 Gastro-esophageal reflux disease with esophagitis, without bleeding: Secondary | ICD-10-CM | POA: Diagnosis not present

## 2020-05-25 DIAGNOSIS — J3081 Allergic rhinitis due to animal (cat) (dog) hair and dander: Secondary | ICD-10-CM | POA: Diagnosis not present

## 2020-05-28 NOTE — Progress Notes (Signed)
[place]  [phone] [phone]     ID: Theresa Bradley DOB: 18-Jan-1951  MR#: [id number]  ERD#:[id number]  Patient Care Team: [name], MD as PCP - General [name], [name], MD as Consulting Physician (Allergy and Immunology) [name], OD as Referring Physician [name], RN as Oncology Nurse Navigator [name], RN as Oncology Nurse Navigator [name], MD as Consulting Physician (General Surgery) Venola Castello, [name], MD as Consulting Physician (Oncology) [name], MD as Attending Physician (Radiation Oncology) [name], MD as Referring Physician (Dermatology) [name], MD as Consulting Physician (Cardiology) Chauncey Cruel, MD OTHER MD:  CHIEF COMPLAINT: triple positive breast cancer (s/p right mastectomy)  CURRENT TREATMENT: weekly paclitaxel x12; trastuzumab to complete a year   INTERVAL HISTORY: Theresa Bradley" returns today for follow up and treatment of her triple positive breast cancer accompanied by her husband [place].   She started on weekly paclitaxel and trastuzumab on 04/24/2020. Today is week 6.   Her echocardiogram 01/17/2020 showed an ejection fraction in the 60-65% range.  She is scheduled for repeat echocardiogram 05/30/2020    REVIEW OF SYSTEMS: Theresa Bradley feels a little fatigued from the chemo but does not have other significant issues.  She does have a runny nose which may well be due to the Taxol.  She has seen her allergies and he has started her on medication for that.  She has taste perversion.  She has had some diarrhea, up to 3-4 times a day, a couple of days a week since starting Taxol.  She is not taking anything for that.  She walks about 20 minutes a couple of times a week.  She has had absolutely no peripheral neuropathy symptoms.  A detailed review of systems today was otherwise stable.   HISTORY OF CURRENT ILLNESS: From the original intake note:    Theresa Bradley was my patient a little over 11 years ago when she underwent lumpectomy on 05/15/2008 for ductal carcinoma in situ and received radiation therapy under Dr. Valere Dross. 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  More recently, she presented to her PCP with a small palpable right breast lump around 11 o'clock, just superior and lateral to the nipple. She underwent bilateral diagnostic mammography with tomography and right breast ultrasonography at [facility] on 12/26/2019 showing: breast density category B; 6 mm superficial mass in the right breast at 11 o'clock in the retroareolar region; no enlarged adenopathy in right axilla.  Accordingly on 12/30/2019 she proceeded to biopsy of the right breast area in question. The pathology from this procedure ([id number]) showed: invasive ductal carcinoma, grade 2. Prognostic indicators significant for: estrogen receptor, 95% positive and progesterone receptor, 80% positive, both with strong staining intensity. Proliferation marker Ki67 at 20%. HER2 positive by immunohistochemistry (3+).  The patient's subsequent history is as detailed below.   PAST MEDICAL HISTORY: Past Medical History:  Diagnosis Date  . Anxiety   . Breast cancer ([place]) 2009   right  . Cancer ([place])   . COUGH, CHRONIC 05/10/2010  . Dysrhythmia    occationally "skips a beat"  . Family history of brain cancer   . Family history of breast cancer   . Family history of lung cancer   . Family history of multiple myeloma   . Family history of skin cancer   .  GERD (gastroesophageal reflux disease)   . HYPERTENSION 04/16/2009  . HYPOTHYROIDISM 04/16/2009  . OSTEOARTHRITIS 04/16/2009  . Personal history of radiation therapy   . SENILE LENTIGO 06/10/2010    PAST SURGICAL HISTORY: Past Surgical History:  Procedure Laterality Date  . BREAST EXCISIONAL BIOPSY Right   . BREAST  EXCISIONAL BIOPSY Left   . BREAST LUMPECTOMY Right 2009   radiation only  . BREAST LUMPECTOMY WITH RADIOACTIVE SEED AND SENTINEL LYMPH NODE BIOPSY Right 01/31/2020   Procedure: RIGHT BREAST LUMPECTOMY WITH RADIOACTIVE SEED AND SENTINEL LYMPH NODE BIOPSY;  Surgeon: [name], MD;  Location: [facility];  Service: General;  Laterality: Right;  PEC BLOCK  . BREAST SURGERY  2009   X 2, cancer stage 0, lumpectomy  . [place]  . CHOLECYSTECTOMY    . DILATATION & CURETTAGE/HYSTEROSCOPY WITH MYOSURE  11/2015  . HERNIA REPAIR  2009  . JOINT REPLACEMENT  2008   hip  . OPEN SURGICAL REPAIR OF GLUTEAL TENDON Left 07/18/2019   Procedure: Left hip bearing surface revision with gluteal tendon repair;  Surgeon: Gaynelle Arabian, MD;  Location: [facility];  Service: Orthopedics;  Laterality: Left;  2 hrs  . PORTACATH PLACEMENT N/A 03/15/2020   Procedure: INSERTION PORT-A-CATH WITH ULTRASOUND GUIDANCE;  Surgeon: [name], MD;  Location: [facility];  Service: General;  Laterality: N/A;  . SIMPLE MASTECTOMY WITH AXILLARY SENTINEL NODE BIOPSY Right 03/15/2020   Procedure: RIGHT SIMPLE MASTECTOMY;  Surgeon: [name], MD;  Location: [facility];  Service: General;  Laterality: Right;    FAMILY HISTORY: Family History  Problem Relation Age of Onset  . Arthritis Other   . Hypertension Other   . Cancer Other        2 aunts  . Hyperlipidemia Father   . Dementia Father   . Skin cancer Father   . Skin cancer Mother   . Breast cancer Maternal Aunt 63  . Breast cancer Maternal Aunt        dx. mid-70s  . Lung cancer Paternal Uncle   . Multiple myeloma Paternal Uncle   . Cancer Paternal Aunt        unknown type, dx. >50  . Brain cancer Cousin        dx. 80s, paternal cousin   The patient's father is 34 years old and the patient's mother is 29 years old as of February 2021.  The patient has one brother, no sisters.  A maternal aunt  was diagnosed with breast cancer at age 32.  A paternal uncle was diagnosed with lung cancer in his 13s and another paternal uncle with a "blood cancer" in his late 41s.  There is no history of ovarian pancreatic or prostate cancer in the family to her knowledge.   GYNECOLOGIC HISTORY:  No LMP recorded. Patient is postmenopausal. Menarche: 69 years old Age at first live birth: 69 years old [place] P 2 LMP HRT no  Hysterectomy?  No BSO?  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  Her husband Theresa Bradley runs and environmental company that for example checks for mold and then remediate.  Son Theresa Bradley, 79 years old, lives in [place] and works as a Clinical biochemist.  He has 2 sons.  The patient's son Catalina Antigua 66 lives in [place] and works in Chief Executive Officer.  He has no children.  The patient is Episcopalian    ADVANCED DIRECTIVES: In the absence of any documentation to the contrary, the patient's spouse is their HCPOA.    HEALTH MAINTENANCE: Social History   Tobacco Use  . Smoking status: Never Smoker  . Smokeless tobacco: Never Used  . Tobacco comment: father smoked when she was younger   Vaping Use  . Vaping Use: Never used  Substance Use Topics  . Alcohol use: Yes    Comment: occasionally   . Drug use: No     Colonoscopy: 03/2009/[facility]  PAP: 10/2013, negative  Bone density: 11/2015, T score -1.7   Allergies  Allergen Reactions  . Codeine Sulfate Nausea And Vomiting  . Erythromycin Base Other (See Comments)    Stomach ache  . Esomeprazole Magnesium     REACTION: GI upset    Current Outpatient Medications  Medication Sig Dispense Refill  . ALPRAZolam (XANAX) 0.25 MG tablet TAKE 1/2 TABLET BY MOUTH AT BEDTIME AS NEEDED INSOMNIA 45 tablet 0  . amLODipine (NORVASC) 5 MG tablet Take 1 tablet (5 mg total) by mouth daily. 90 tablet 3  . BIOTIN 5000 PO Take by mouth.    .  Calcium Carbonate-Vitamin D 600-200 MG-UNIT TABS Take 1 tablet by mouth daily.    . citalopram (CELEXA) 20 MG tablet Take 1 tablet (20 mg total) by mouth daily. 90 tablet 3  . docusate sodium (COLACE) 100 MG capsule Take 100 mg by mouth daily as needed for mild constipation.     Marland Kitchen EPIPEN 2-PAK 0.3 MG/0.3ML SOAJ injection Inject 0.3 mg into the muscle as needed for anaphylaxis.     . famotidine (PEPCID) 10 MG tablet Take 10 mg by mouth daily.     . fluocinonide gel (LIDEX) 0.05 %     . levothyroxine (SYNTHROID) 50 MCG tablet TAKE 1 TABLET BY MOUTH EVERY DAY 90 tablet 1  . lidocaine-prilocaine (EMLA) cream Apply to affected area once 30 g 3  . magic mouthwash SOLN Take 5 mLs by mouth 4 (four) times daily as needed for mouth pain. 240 mL 1  . meloxicam (MOBIC) 15 MG tablet TAKE 1 TABLET BY MOUTH EVERY DAY 90 tablet 1  . mupirocin ointment (BACTROBAN) 2 % APPLY TO AFFECTED AREA EVERY DAY    . prochlorperazine (COMPAZINE) 10 MG tablet Take 1 tablet (10 mg total) by mouth every 6 (six) hours as needed (Nausea or vomiting). 30 tablet 1  . tretinoin (RETIN-A) 0.025 % cream Apply topically at bedtime. 45 g 6   No current facility-administered medications for this visit.   Facility-Administered Medications Ordered in Other Visits  Medication Dose Route Frequency Provider Last Rate Last Admin  . heparin lock flush 100 unit/mL  500 Units Intracatheter Once PRN Kimia Finan, [name], MD      . PACLitaxel (TAXOL) 144 mg in sodium chloride 0.9 % 250 mL chemo infusion (</= 23m/m2)  80 mg/m2 (Treatment Plan Recorded) Intravenous Once Michaila Kenney, GVirgie Dad MD 274 mL/hr at 05/29/20 1100 144 mg at 05/29/20 1100  . sodium chloride flush (NS) 0.9 % injection 10 mL  10 mL Intracatheter PRN Jenice Leiner, GVirgie Dad MD        OBJECTIVE: White woman examined in the treatment area  There were no vitals filed for this visit.   There is no height or weight on file to calculate BMI.   Wt Readings from Last 3 Encounters:    05/22/20 149 lb 12 oz (67.9 kg)  05/15/20 151 lb 8 oz (68.7  kg)  05/08/20 151 lb 8 oz (68.7 kg)  For vitals on 05/29/2020 see the infusion area flowsheet   ECOG FS:1 - Symptomatic but completely ambulatory  Sclerae unicteric, EOMs intact Wearing a mask No cervical or supraclavicular adenopathy Lungs no rales or rhonchi Heart regular rate and rhythm Abd soft, nontender, positive bowel sounds MSK no focal spinal tenderness, no upper extremity lymphedema Neuro: nonfocal, well oriented, appropriate affect Breasts: Deferred   LAB RESULTS:  CMP     Component Value Date/Time   NA 141 05/29/2020 0855   K 3.5 05/29/2020 0855   CL 108 05/29/2020 0855   CO2 27 05/29/2020 0855   GLUCOSE 93 05/29/2020 0855   BUN 11 05/29/2020 0855   CREATININE 0.76 05/29/2020 0855   CREATININE 0.81 01/11/2020 1209   CALCIUM 9.8 05/29/2020 0855   PROT 7.0 05/29/2020 0855   ALBUMIN 3.7 05/29/2020 0855   AST 17 05/29/2020 0855   AST 14 (L) 01/11/2020 1209   ALT 20 05/29/2020 0855   ALT 13 01/11/2020 1209   ALKPHOS 65 05/29/2020 0855   BILITOT 0.6 05/29/2020 0855   BILITOT 0.5 01/11/2020 1209   GFRNONAA >60 05/29/2020 0855   GFRNONAA >60 01/11/2020 1209   GFRAA >60 05/29/2020 0855   GFRAA >60 01/11/2020 1209    No results found for: TOTALPROTELP, ALBUMINELP, A1GS, A2GS, BETS, BETA2SER, GAMS, MSPIKE, SPEI  Lab Results  Component Value Date   WBC 4.6 05/29/2020   NEUTROABS 2.5 05/29/2020   HGB 11.2 (L) 05/29/2020   HCT 34.0 (L) 05/29/2020   MCV 91.2 05/29/2020   PLT 351 05/29/2020    No results found for: LABCA2  No components found for: [id number]  No results for input(s): INR in the last 168 hours.  No results found for: LABCA2  No results found for: JSR159  No results found for: YVO592  No results found for: TWK462  No results found for: CA2729  No components found for: HGQUANT  No results found for: CEA1 / No results found for: CEA1   No results found for:  AFPTUMOR  No results found for: CHROMOGRNA  No results found for: KPAFRELGTCHN, LAMBDASER, KAPLAMBRATIO (kappa/lambda light chains)  No results found for: HGBA, HGBA2QUANT, HGBFQUANT, HGBSQUAN (Hemoglobinopathy evaluation)   No results found for: LDH  No results found for: IRON, TIBC, IRONPCTSAT (Iron and TIBC)  No results found for: FERRITIN  Urinalysis    Component Value Date/Time   COLORURINE yellow 05/24/2010 0855   APPEARANCEUR Clear 05/24/2010 0855   LABSPEC 1.015 05/24/2010 0855   PHURINE 6.0 05/24/2010 0855   GLUCOSEU NEGATIVE 09/22/2007 1110   HGBUR negative 05/24/2010 0855   BILIRUBINUR n 06/29/2019 1533   KETONESUR NEGATIVE 09/22/2007 1110   PROTEINUR Negative 06/29/2019 [place] 09/22/2007 1110   UROBILINOGEN 0.2 06/29/2019 1533   UROBILINOGEN 0.2 05/24/2010 0855   NITRITE n 06/29/2019 1533   NITRITE negative 05/24/2010 0855   LEUKOCYTESUR Negative 06/29/2019 1533     STUDIES: No results found.   ELIGIBLE FOR AVAILABLE RESEARCH PROTOCOL: no  ASSESSMENT: 69 y.o. [place] woman status post right breast periareolar biopsy 12/30/2019 for a clinical T1a N0, stage I invasive ductal carcinoma, grade 2, estrogen and progesterone receptor positive, HER-2 amplified, with an MIB-one of 20%.  (1) history of prior right lumpectomy June 2009 for ductal carcinoma in situ  (a) status post adjuvant radiation  (b) did not receive antiestrogens  (2) status post repeat right lumpectomy and sentinel lymph node sampling 01/31/2020 for a  pT1c pN0, stage IA invasive ductal carcinoma, grade 2, with positive margins  (a) total one sentinel node removed  (3) s/p right mastectomy 03/15/2020 showing residual invasive ductal carcinoma measuring 0.9 cm, but negative margins (added to prior 1.1 lumpectomy, still T1 N0 or stage IA)  (a) one additional axillary node was removed (total 2 right axillary nodes removed)  (3) adjuvant chemotherapy with paclitaxel and  trastuzumab weekly x 12 started 04/24/2020 04/17/2020  (4) trastuzumab to be continued every 21 days to total one year  (a) echo 01/17/2020 shows an ejection fraction in the 60-65% range  (5) to start antiestrogens at the completion of local treatment.  (6) genetics testing 02/01/2020 through the [facility] STAT panel found no deleterious mutations then ATM, BRCA1, BRCA2, CDH1, CHEK2, PALB2, PTEN, STK11 and TP53.    PLAN: Liela is halfway through her paclitaxel treatment with today's dose.  So far she has not had any peripheral neuropathy which is favorable.  She seems to be managing to keep some of her hair.  I assured her that this does not affect the efficacy of her therapy 1 way or the other.  I encouraged her to walk a little more frequently.  I suggested she consider Imodium after her second very loose bowel movement and see if that takes care of the problem.  Otherwise she will return next week for her next dose and we will see her again 2 weeks from now  Total encounter time 20 minutes.Sarajane Jews C. Koya Hunger, MD 05/29/20 11:41 AM Medical Oncology and Hematology [facility] [place], [place] [address] Tel. [phone]    Fax. [phone]   I, Wilburn Mylar, am acting as scribe for Dr. [name]. Annibelle Brazie.  I, Lurline Del MD, have reviewed the above documentation for accuracy and completeness, and I agree with the above.    *Total Encounter Time as defined by the Centers for Medicare and Medicaid Services includes, in addition to the face-to-face time of a patient visit (documented in the note above) non-face-to-face time: obtaining and reviewing outside history, ordering and reviewing medications, tests or procedures, care coordination (communications with other health care professionals or caregivers) and documentation in the medical record.

## 2020-05-29 ENCOUNTER — Inpatient Hospital Stay: Payer: Medicare Other

## 2020-05-29 ENCOUNTER — Other Ambulatory Visit: Payer: Self-pay

## 2020-05-29 ENCOUNTER — Encounter: Payer: Self-pay | Admitting: *Deleted

## 2020-05-29 ENCOUNTER — Inpatient Hospital Stay: Payer: Medicare Other | Admitting: Oncology

## 2020-05-29 ENCOUNTER — Inpatient Hospital Stay: Payer: Medicare Other | Attending: Oncology

## 2020-05-29 VITALS — BP 146/70 | HR 64 | Temp 97.8°F | Resp 18

## 2020-05-29 DIAGNOSIS — Z5111 Encounter for antineoplastic chemotherapy: Secondary | ICD-10-CM | POA: Diagnosis not present

## 2020-05-29 DIAGNOSIS — Z95828 Presence of other vascular implants and grafts: Secondary | ICD-10-CM

## 2020-05-29 DIAGNOSIS — Z170 Estrogen receptor positive status [ER+]: Secondary | ICD-10-CM | POA: Diagnosis not present

## 2020-05-29 DIAGNOSIS — Z5112 Encounter for antineoplastic immunotherapy: Secondary | ICD-10-CM | POA: Insufficient documentation

## 2020-05-29 DIAGNOSIS — C50411 Malignant neoplasm of upper-outer quadrant of right female breast: Secondary | ICD-10-CM | POA: Diagnosis not present

## 2020-05-29 DIAGNOSIS — C50111 Malignant neoplasm of central portion of right female breast: Secondary | ICD-10-CM | POA: Insufficient documentation

## 2020-05-29 LAB — COMPREHENSIVE METABOLIC PANEL
ALT: 20 U/L (ref 0–44)
AST: 17 U/L (ref 15–41)
Albumin: 3.7 g/dL (ref 3.5–5.0)
Alkaline Phosphatase: 65 U/L (ref 38–126)
Anion gap: 6 (ref 5–15)
BUN: 11 mg/dL (ref 8–23)
CO2: 27 mmol/L (ref 22–32)
Calcium: 9.8 mg/dL (ref 8.9–10.3)
Chloride: 108 mmol/L (ref 98–111)
Creatinine, Ser: 0.76 mg/dL (ref 0.44–1.00)
GFR calc Af Amer: >60 mL/min (ref >60)
GFR calc non Af Amer: >60 mL/min (ref >60)
Glucose, Bld: 93 mg/dL (ref 70–99)
Potassium: 3.5 mmol/L (ref 3.5–5.1)
Sodium: 141 mmol/L (ref 135–145)
Total Bilirubin: 0.6 mg/dL (ref 0.3–1.2)
Total Protein: 7 g/dL (ref 6.5–8.1)

## 2020-05-29 LAB — CBC WITH DIFFERENTIAL/PLATELET
Abs Immature Granulocytes: 0.01 10*3/uL (ref 0.00–0.07)
Basophils Absolute: 0.1 10*3/uL (ref 0.0–0.1)
Basophils Relative: 2 %
Eosinophils Absolute: 0.2 10*3/uL (ref 0.0–0.5)
Eosinophils Relative: 4 %
HCT: 34 % — ABNORMAL LOW (ref 36.0–46.0)
Hemoglobin: 11.2 g/dL — ABNORMAL LOW (ref 12.0–15.0)
Immature Granulocytes: 0 %
Lymphocytes Relative: 31 %
Lymphs Abs: 1.5 10*3/uL (ref 0.7–4.0)
MCH: 30 pg (ref 26.0–34.0)
MCHC: 32.9 g/dL (ref 30.0–36.0)
MCV: 91.2 fL (ref 80.0–100.0)
Monocytes Absolute: 0.4 10*3/uL (ref 0.1–1.0)
Monocytes Relative: 9 %
Neutro Abs: 2.5 10*3/uL (ref 1.7–7.7)
Neutrophils Relative %: 54 %
Platelets: 351 10*3/uL (ref 150–400)
RBC: 3.73 MIL/uL — ABNORMAL LOW (ref 3.87–5.11)
RDW: 13.7 % (ref 11.5–15.5)
WBC: 4.6 10*3/uL (ref 4.0–10.5)
nRBC: 0 % (ref 0.0–0.2)

## 2020-05-29 MED ORDER — SODIUM CHLORIDE 0.9 % IV SOLN
Freq: Once | INTRAVENOUS | Status: AC
  Filled 2020-05-29: qty 250

## 2020-05-29 MED ORDER — SODIUM CHLORIDE 0.9% FLUSH
10.0000 mL | Freq: Once | INTRAVENOUS | Status: AC
  Administered 2020-05-29: 10 mL
  Filled 2020-05-29: qty 10

## 2020-05-29 MED ORDER — FAMOTIDINE IN NACL 20-0.9 MG/50ML-% IV SOLN
INTRAVENOUS | Status: AC
  Filled 2020-05-29: qty 50

## 2020-05-29 MED ORDER — TRASTUZUMAB-DKST CHEMO 150 MG IV SOLR
150.0000 mg | Freq: Once | INTRAVENOUS | Status: AC
  Administered 2020-05-29: 150 mg via INTRAVENOUS
  Filled 2020-05-29: qty 7.14

## 2020-05-29 MED ORDER — FAMOTIDINE IN NACL 20-0.9 MG/50ML-% IV SOLN
20.0000 mg | Freq: Once | INTRAVENOUS | Status: AC
  Administered 2020-05-29: 20 mg via INTRAVENOUS

## 2020-05-29 MED ORDER — DIPHENHYDRAMINE HCL 50 MG/ML IJ SOLN
12.5000 mg | Freq: Once | INTRAMUSCULAR | Status: AC
  Administered 2020-05-29: 12.5 mg via INTRAVENOUS

## 2020-05-29 MED ORDER — SODIUM CHLORIDE 0.9% FLUSH
10.0000 mL | INTRAVENOUS | Status: DC | PRN
  Administered 2020-05-29: 10 mL
  Filled 2020-05-29: qty 10

## 2020-05-29 MED ORDER — ACETAMINOPHEN 325 MG PO TABS
650.0000 mg | ORAL_TABLET | Freq: Once | ORAL | Status: AC
  Administered 2020-05-29: 650 mg via ORAL

## 2020-05-29 MED ORDER — DEXAMETHASONE SODIUM PHOSPHATE 10 MG/ML IJ SOLN
4.0000 mg | Freq: Once | INTRAMUSCULAR | Status: AC
  Administered 2020-05-29: 4 mg via INTRAVENOUS

## 2020-05-29 MED ORDER — SODIUM CHLORIDE 0.9 % IV SOLN
80.0000 mg/m2 | Freq: Once | INTRAVENOUS | Status: AC
  Administered 2020-05-29: 144 mg via INTRAVENOUS
  Filled 2020-05-29: qty 24

## 2020-05-29 MED ORDER — ACETAMINOPHEN 325 MG PO TABS
ORAL_TABLET | ORAL | Status: AC
  Filled 2020-05-29: qty 2

## 2020-05-29 MED ORDER — HEPARIN SOD (PORK) LOCK FLUSH 100 UNIT/ML IV SOLN
500.0000 [IU] | Freq: Once | INTRAVENOUS | Status: AC | PRN
  Administered 2020-05-29: 500 [IU]
  Filled 2020-05-29: qty 5

## 2020-05-29 MED ORDER — DIPHENHYDRAMINE HCL 50 MG/ML IJ SOLN
INTRAMUSCULAR | Status: AC
  Filled 2020-05-29: qty 1

## 2020-05-29 MED ORDER — DEXAMETHASONE SODIUM PHOSPHATE 10 MG/ML IJ SOLN
INTRAMUSCULAR | Status: AC
  Filled 2020-05-29: qty 1

## 2020-05-29 NOTE — Patient Instructions (Signed)
[facility] Discharge Instructions for Patients Receiving Chemotherapy  Today you received the following chemotherapy agents: trastuzumab and paclitaxel.  To help prevent nausea and vomiting after your treatment, we encourage you to take your nausea medication as directed.   If you develop nausea and vomiting that is not controlled by your nausea medication, call the clinic.   BELOW ARE SYMPTOMS THAT SHOULD BE REPORTED IMMEDIATELY:  *FEVER GREATER THAN 100.5 F  *CHILLS WITH OR WITHOUT FEVER  NAUSEA AND VOMITING THAT IS NOT CONTROLLED WITH YOUR NAUSEA MEDICATION  *UNUSUAL SHORTNESS OF BREATH  *UNUSUAL BRUISING OR BLEEDING  TENDERNESS IN MOUTH AND THROAT WITH OR WITHOUT PRESENCE OF ULCERS  *URINARY PROBLEMS  *BOWEL PROBLEMS  UNUSUAL RASH Items with * indicate a potential emergency and should be followed up as soon as possible.  Feel free to call the clinic should you have any questions or concerns. The clinic phone number is [phone].  Please show the CHEMO ALERT CARD at check-in to the Emergency Department and triage nurse.   

## 2020-05-29 NOTE — Patient Instructions (Signed)

## 2020-05-30 ENCOUNTER — Ambulatory Visit (HOSPITAL_COMMUNITY)
Admission: RE | Admit: 2020-05-30 | Discharge: 2020-05-30 | Disposition: A | Discharge status: Home / Self Care | Payer: Medicare Other | Source: Ambulatory Visit | Attending: Oncology | Admitting: Oncology

## 2020-05-30 ENCOUNTER — Telehealth: Payer: Self-pay | Admitting: Oncology

## 2020-05-30 ENCOUNTER — Other Ambulatory Visit (HOSPITAL_COMMUNITY): Payer: Medicare Other

## 2020-05-30 DIAGNOSIS — Z170 Estrogen receptor positive status [ER+]: Secondary | ICD-10-CM | POA: Insufficient documentation

## 2020-05-30 DIAGNOSIS — Z01818 Encounter for other preprocedural examination: Secondary | ICD-10-CM | POA: Diagnosis not present

## 2020-05-30 DIAGNOSIS — I313 Pericardial effusion (noninflammatory): Secondary | ICD-10-CM

## 2020-05-30 DIAGNOSIS — C50411 Malignant neoplasm of upper-outer quadrant of right female breast: Secondary | ICD-10-CM | POA: Diagnosis not present

## 2020-05-30 DIAGNOSIS — I350 Nonrheumatic aortic (valve) stenosis: Secondary | ICD-10-CM | POA: Diagnosis not present

## 2020-05-30 DIAGNOSIS — I10 Essential (primary) hypertension: Secondary | ICD-10-CM | POA: Diagnosis not present

## 2020-05-30 DIAGNOSIS — I083 Combined rheumatic disorders of mitral, aortic and tricuspid valves: Secondary | ICD-10-CM | POA: Diagnosis not present

## 2020-05-30 NOTE — Telephone Encounter (Signed)
No 7/6 los. No changes made to pt's schedule.

## 2020-05-30 NOTE — Progress Notes (Signed)
  Echocardiogram 2D Echocardiogram has been performed.  Jennette Dubin 05/30/2020, 10:42 AM

## 2020-05-31 ENCOUNTER — Encounter: Payer: Self-pay | Admitting: Rehabilitation

## 2020-06-05 ENCOUNTER — Inpatient Hospital Stay: Payer: Medicare Other

## 2020-06-05 ENCOUNTER — Other Ambulatory Visit: Payer: Self-pay

## 2020-06-05 VITALS — BP 150/66 | HR 66 | Temp 98.1°F | Resp 18 | Wt 150.8 lb

## 2020-06-05 DIAGNOSIS — C50111 Malignant neoplasm of central portion of right female breast: Secondary | ICD-10-CM | POA: Diagnosis not present

## 2020-06-05 DIAGNOSIS — Z5112 Encounter for antineoplastic immunotherapy: Secondary | ICD-10-CM | POA: Diagnosis not present

## 2020-06-05 DIAGNOSIS — Z95828 Presence of other vascular implants and grafts: Secondary | ICD-10-CM

## 2020-06-05 DIAGNOSIS — C50411 Malignant neoplasm of upper-outer quadrant of right female breast: Secondary | ICD-10-CM

## 2020-06-05 DIAGNOSIS — Z5111 Encounter for antineoplastic chemotherapy: Secondary | ICD-10-CM | POA: Diagnosis not present

## 2020-06-05 DIAGNOSIS — Z170 Estrogen receptor positive status [ER+]: Secondary | ICD-10-CM

## 2020-06-05 LAB — CBC WITH DIFFERENTIAL/PLATELET
Abs Immature Granulocytes: 0.02 10*3/uL (ref 0.00–0.07)
Basophils Absolute: 0.1 10*3/uL (ref 0.0–0.1)
Basophils Relative: 1 %
Eosinophils Absolute: 0.2 10*3/uL (ref 0.0–0.5)
Eosinophils Relative: 5 %
HCT: 32 % — ABNORMAL LOW (ref 36.0–46.0)
Hemoglobin: 10.7 g/dL — ABNORMAL LOW (ref 12.0–15.0)
Immature Granulocytes: 0 %
Lymphocytes Relative: 28 %
Lymphs Abs: 1.3 10*3/uL (ref 0.7–4.0)
MCH: 30.1 pg (ref 26.0–34.0)
MCHC: 33.4 g/dL (ref 30.0–36.0)
MCV: 89.9 fL (ref 80.0–100.0)
Monocytes Absolute: 0.4 10*3/uL (ref 0.1–1.0)
Monocytes Relative: 9 %
Neutro Abs: 2.5 10*3/uL (ref 1.7–7.7)
Neutrophils Relative %: 57 %
Platelets: 359 10*3/uL (ref 150–400)
RBC: 3.56 MIL/uL — ABNORMAL LOW (ref 3.87–5.11)
RDW: 13.9 % (ref 11.5–15.5)
WBC: 4.5 10*3/uL (ref 4.0–10.5)
nRBC: 0 % (ref 0.0–0.2)

## 2020-06-05 LAB — COMPREHENSIVE METABOLIC PANEL
ALT: 21 U/L (ref 0–44)
AST: 16 U/L (ref 15–41)
Albumin: 3.5 g/dL (ref 3.5–5.0)
Alkaline Phosphatase: 63 U/L (ref 38–126)
Anion gap: 9 (ref 5–15)
BUN: 10 mg/dL (ref 8–23)
CO2: 26 mmol/L (ref 22–32)
Calcium: 9.2 mg/dL (ref 8.9–10.3)
Chloride: 107 mmol/L (ref 98–111)
Creatinine, Ser: 0.74 mg/dL (ref 0.44–1.00)
GFR calc Af Amer: >60 mL/min (ref >60)
GFR calc non Af Amer: >60 mL/min (ref >60)
Glucose, Bld: 108 mg/dL — ABNORMAL HIGH (ref 70–99)
Potassium: 3.3 mmol/L — ABNORMAL LOW (ref 3.5–5.1)
Sodium: 142 mmol/L (ref 135–145)
Total Bilirubin: 0.6 mg/dL (ref 0.3–1.2)
Total Protein: 6.8 g/dL (ref 6.5–8.1)

## 2020-06-05 MED ORDER — SODIUM CHLORIDE 0.9 % IV SOLN
Freq: Once | INTRAVENOUS | Status: AC
  Filled 2020-06-05: qty 250

## 2020-06-05 MED ORDER — ACETAMINOPHEN 325 MG PO TABS
ORAL_TABLET | ORAL | Status: AC
  Filled 2020-06-05: qty 2

## 2020-06-05 MED ORDER — DIPHENHYDRAMINE HCL 50 MG/ML IJ SOLN
INTRAMUSCULAR | Status: AC
  Filled 2020-06-05: qty 1

## 2020-06-05 MED ORDER — DEXAMETHASONE SODIUM PHOSPHATE 10 MG/ML IJ SOLN
4.0000 mg | Freq: Once | INTRAMUSCULAR | Status: AC
  Administered 2020-06-05: 4 mg via INTRAVENOUS

## 2020-06-05 MED ORDER — TRASTUZUMAB-DKST CHEMO 150 MG IV SOLR
150.0000 mg | Freq: Once | INTRAVENOUS | Status: AC
  Administered 2020-06-05: 150 mg via INTRAVENOUS
  Filled 2020-06-05: qty 7.14

## 2020-06-05 MED ORDER — DIPHENHYDRAMINE HCL 50 MG/ML IJ SOLN
12.5000 mg | Freq: Once | INTRAMUSCULAR | Status: AC
  Administered 2020-06-05: 12.5 mg via INTRAVENOUS

## 2020-06-05 MED ORDER — DEXAMETHASONE SODIUM PHOSPHATE 10 MG/ML IJ SOLN
INTRAMUSCULAR | Status: AC
  Filled 2020-06-05: qty 1

## 2020-06-05 MED ORDER — SODIUM CHLORIDE 0.9 % IV SOLN
80.0000 mg/m2 | Freq: Once | INTRAVENOUS | Status: AC
  Administered 2020-06-05: 144 mg via INTRAVENOUS
  Filled 2020-06-05: qty 24

## 2020-06-05 MED ORDER — FAMOTIDINE IN NACL 20-0.9 MG/50ML-% IV SOLN
20.0000 mg | Freq: Once | INTRAVENOUS | Status: AC
  Administered 2020-06-05: 20 mg via INTRAVENOUS

## 2020-06-05 MED ORDER — HEPARIN SOD (PORK) LOCK FLUSH 100 UNIT/ML IV SOLN
500.0000 [IU] | Freq: Once | INTRAVENOUS | Status: AC | PRN
  Administered 2020-06-05: 500 [IU]
  Filled 2020-06-05: qty 5

## 2020-06-05 MED ORDER — FAMOTIDINE IN NACL 20-0.9 MG/50ML-% IV SOLN
INTRAVENOUS | Status: AC
  Filled 2020-06-05: qty 50

## 2020-06-05 MED ORDER — SODIUM CHLORIDE 0.9% FLUSH
10.0000 mL | INTRAVENOUS | Status: DC | PRN
  Administered 2020-06-05: 10 mL
  Filled 2020-06-05: qty 10

## 2020-06-05 MED ORDER — ACETAMINOPHEN 325 MG PO TABS
650.0000 mg | ORAL_TABLET | Freq: Once | ORAL | Status: AC
  Administered 2020-06-05: 650 mg via ORAL

## 2020-06-05 MED ORDER — SODIUM CHLORIDE 0.9% FLUSH
10.0000 mL | Freq: Once | INTRAVENOUS | Status: AC
  Administered 2020-06-05: 10 mL
  Filled 2020-06-05: qty 10

## 2020-06-05 NOTE — Patient Instructions (Signed)
[facility] Discharge Instructions for Patients Receiving Chemotherapy  Today you received the following chemotherapy agents: trastuzumab and paclitaxel.  To help prevent nausea and vomiting after your treatment, we encourage you to take your nausea medication as directed.   If you develop nausea and vomiting that is not controlled by your nausea medication, call the clinic.   BELOW ARE SYMPTOMS THAT SHOULD BE REPORTED IMMEDIATELY:  *FEVER GREATER THAN 100.5 F  *CHILLS WITH OR WITHOUT FEVER  NAUSEA AND VOMITING THAT IS NOT CONTROLLED WITH YOUR NAUSEA MEDICATION  *UNUSUAL SHORTNESS OF BREATH  *UNUSUAL BRUISING OR BLEEDING  TENDERNESS IN MOUTH AND THROAT WITH OR WITHOUT PRESENCE OF ULCERS  *URINARY PROBLEMS  *BOWEL PROBLEMS  UNUSUAL RASH Items with * indicate a potential emergency and should be followed up as soon as possible.  Feel free to call the clinic should you have any questions or concerns. The clinic phone number is [phone].  Please show the CHEMO ALERT CARD at check-in to the Emergency Department and triage nurse.   

## 2020-06-05 NOTE — Patient Instructions (Signed)

## 2020-06-06 NOTE — Progress Notes (Deleted)
[place]  [phone] [phone]     ID: Theresa Bradley DOB: [birth year]  MR#: [id number]  WOE#:[id number]  Patient Care Team: Theresa Post, MD as PCP - General Theresa Bradley, Theresa Grinder, MD as Consulting Physician (Allergy and Immunology) Theresa Bradley, OD as Referring Physician Theresa Germany, RN as Oncology Nurse Navigator Theresa Kaufmann, RN as Oncology Nurse Navigator Theresa Luna, MD as Consulting Physician (General Surgery) [name], [name], MD as Consulting Physician (Oncology) Theresa Gibson, MD as Attending Physician (Radiation Oncology) Theresa Levans, MD as Referring Physician (Dermatology) Theresa Dresser, MD as Consulting Physician (Cardiology) Theresa Dock, NP OTHER MD:  CHIEF COMPLAINT: triple positive breast cancer (s/p right mastectomy)  CURRENT TREATMENT: weekly paclitaxel x12; trastuzumab to complete a year   INTERVAL HISTORY: Theresa Bradley" returns today for follow up and treatment of her triple positive breast cancer accompanied by her husband Theresa Bradley.   She started on weekly paclitaxel and trastuzumab on 04/24/2020. Today is week 6.   Her echocardiogram 01/17/2020 showed an ejection fraction in the 60-65% range.  She is scheduled for repeat echocardiogram 05/30/2020    REVIEW OF SYSTEMS: Theresa Bradley   HISTORY OF CURRENT ILLNESS: From the original intake note:   Theresa Bradley was my patient a little over 11 years ago when she underwent lumpectomy on 05/15/2008 for ductal carcinoma in situ and received radiation therapy under Dr. Valere Bradley. 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  More recently, she presented to her PCP with a small palpable right breast lump around 11 o'clock, just superior and lateral to the nipple. She underwent bilateral diagnostic mammography with tomography and right breast  ultrasonography at [facility] on 12/26/2019 showing: breast density category B; 6 mm superficial mass in the right breast at 11 o'clock in the retroareolar region; no enlarged adenopathy in right axilla.  Accordingly on 12/30/2019 she proceeded to biopsy of the right breast area in question. The pathology from this procedure ([id number]) showed: invasive ductal carcinoma, grade 2. Prognostic indicators significant for: estrogen receptor, 95% positive and progesterone receptor, 80% positive, both with strong staining intensity. Proliferation marker Ki67 at 20%. HER2 positive by immunohistochemistry (3+).  The patient's subsequent history is as detailed below.   PAST MEDICAL HISTORY: Past Medical History:  Diagnosis Date   Anxiety    Breast cancer ([place]) 2009   right   Cancer ([place])    COUGH, CHRONIC 05/10/2010   Dysrhythmia    occationally "skips a beat"   Family history of brain cancer    Family history of breast cancer    Family history of lung cancer    Family history of multiple myeloma    Family history of skin cancer    GERD (gastroesophageal reflux disease)    HYPERTENSION 04/16/2009   HYPOTHYROIDISM 04/16/2009   OSTEOARTHRITIS 04/16/2009   Personal history of radiation therapy    SENILE LENTIGO 06/10/2010    PAST SURGICAL HISTORY: Past Surgical History:  Procedure Laterality Date   BREAST EXCISIONAL BIOPSY Right    BREAST EXCISIONAL BIOPSY Left    BREAST LUMPECTOMY Right 2009   radiation only   BREAST LUMPECTOMY WITH RADIOACTIVE SEED AND SENTINEL LYMPH NODE BIOPSY Right 01/31/2020   Procedure: RIGHT BREAST LUMPECTOMY WITH RADIOACTIVE SEED AND SENTINEL LYMPH NODE BIOPSY;  Surgeon: Theresa Luna, MD;  Location: [facility];  Service: General;  Laterality: Right;  PEC BLOCK   BREAST SURGERY  2009   X 2, cancer stage 0, lumpectomy   CESAREAN SECTION     1979, 1982   CHOLECYSTECTOMY     DILATATION & CURETTAGE/HYSTEROSCOPY WITH MYOSURE   11/2015   HERNIA REPAIR  2009   JOINT REPLACEMENT  2008   hip   OPEN SURGICAL REPAIR OF GLUTEAL TENDON Left 07/18/2019   Procedure: Left hip bearing surface revision with gluteal tendon repair;  Surgeon: Theresa Arabian, MD;  Location: [facility];  Service: Orthopedics;  Laterality: Left;  2 hrs   PORTACATH PLACEMENT N/A 03/15/2020   Procedure: INSERTION PORT-A-CATH WITH ULTRASOUND GUIDANCE;  Surgeon: Theresa Luna, MD;  Location: [facility];  Service: General;  Laterality: N/A;   SIMPLE MASTECTOMY WITH AXILLARY SENTINEL NODE BIOPSY Right 03/15/2020   Procedure: RIGHT SIMPLE MASTECTOMY;  Surgeon: Theresa Luna, MD;  Location: [facility];  Service: General;  Laterality: Right;    FAMILY HISTORY: Family History  Problem Relation Age of Onset   Arthritis Other    Hypertension Other    Cancer Other        2 aunts   Hyperlipidemia Father    Dementia Father    Skin cancer Father    Skin cancer Mother    Breast cancer Maternal Aunt 49   Breast cancer Maternal Aunt        dx. mid-70s   Lung cancer Paternal Uncle    Multiple myeloma Paternal Uncle    Cancer Paternal Aunt        unknown type, dx. >50   Brain cancer Cousin        dx. 75s, paternal cousin   The patient's father is 14 years old and the patient's mother is 46 years old as of February 2021.  The patient has one brother, no sisters.  A maternal aunt was diagnosed with breast cancer at age 20.  A paternal uncle was diagnosed with lung cancer in his 56s and another paternal uncle with a "blood cancer" in his late 70s.  There is no history of ovarian pancreatic or prostate cancer in the family to her knowledge.   GYNECOLOGIC HISTORY:  No LMP recorded. Patient is postmenopausal. Menarche: 69 years old Age at first live birth: 69 years old [place] P 2 LMP HRT no  Hysterectomy?  No BSO?  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  Her husband Theresa Bradley that for example checks for mold and then remediate.  Theresa Bradley, 69 years old, lives in [place] and works as a Clinical biochemist.  He has 2 sons.  The patient's Theresa Bradley lives in [place] and works in Chief Executive Officer.  He has no children.  The patient is Ep[employer]opalian    ADVANCED DIRECTIVES: In the absence of any documentation to the contrary, the patient's spouse is their HCPOA.    HEALTH MAINTENANCE: Social History   Tobacco Use   Smoking status: Never Smoker   Smokeless tobacco: Never Used   Tobacco comment: father smoked when she was younger   Vaping Use   Vaping Use: Never used  Substance Use Topics   Alcohol use: Yes    Comment: occasionally    Drug use: No     Colonoscopy: 03/2009/[facility]  PAP: 10/2013, negative  Bone density: 11/2015, T score -1.7  Allergies  Allergen Reactions   Codeine Sulfate Nausea And Vomiting   Erythromycin Base Other (See Comments)    Stomach ache   Esomeprazole Magnesium     REACTION: GI upset    Current Outpatient Medications  Medication Sig Dispense Refill   ALPRAZolam (XANAX) 0.25 MG tablet TAKE 1/2 TABLET BY MOUTH AT BEDTIME AS NEEDED INSOMNIA 45 tablet 0   amLODipine (NORVASC) 5 MG tablet Take 1 tablet (5 mg total) by mouth daily. 90 tablet 3   BIOTIN 5000 PO Take by mouth.     Calcium Carbonate-Vitamin D 600-200 MG-UNIT TABS Take 1 tablet by mouth daily.     citalopram (CELEXA) 20 MG tablet Take 1 tablet (20 mg total) by mouth daily. 90 tablet 3   docusate sodium (COLACE) 100 MG capsule Take 100 mg by mouth daily as needed for mild constipation.      EPIPEN 2-PAK 0.3 MG/0.3ML SOAJ injection Inject 0.3 mg into the muscle as needed for anaphylaxis.      famotidine (PEPCID) 10 MG tablet Take 10 mg by mouth daily.      fluocinonide gel (LIDEX) 0.05 %      levothyroxine (SYNTHROID) 50 MCG tablet TAKE 1 TABLET  BY MOUTH EVERY DAY 90 tablet 1   lidocaine-prilocaine (EMLA) cream Apply to affected area once 30 g 3   magic mouthwash SOLN Take 5 mLs by mouth 4 (four) times daily as needed for mouth pain. 240 mL 1   meloxicam (MOBIC) 15 MG tablet TAKE 1 TABLET BY MOUTH EVERY DAY 90 tablet 1   mupirocin ointment (BACTROBAN) 2 % APPLY TO AFFECTED AREA EVERY DAY     prochlorperazine (COMPAZINE) 10 MG tablet Take 1 tablet (10 mg total) by mouth every 6 (six) hours as needed (Nausea or vomiting). 30 tablet 1   tretinoin (RETIN-A) 0.025 % cream Apply topically at bedtime. 45 g 6   No current facility-administered medications for this visit.    OBJECTIVE: White woman examined in the treatment area  There were no vitals filed for this visit.   There is no height or weight on file to calculate BMI.   Wt Readings from Last 3 Encounters:  06/05/20 150 lb 12 oz (68.4 kg)  05/22/20 149 lb 12 oz (67.9 kg)  05/15/20 151 lb 8 oz (68.7 kg)  For vitals on 05/29/2020 see the infusion area flowsheet   ECOG FS:1 - Symptomatic but completely ambulatory  GENERAL: Patient is a well appearing female in no acute distress HEENT:  Sclerae anicteric.  Oropharynx clear and moist. No ulcerations or evidence of oropharyngeal candidiasis. Neck is supple.  NODES:  No cervical, supraclavicular, or axillary lymphadenopathy palpated.  BREAST EXAM:  Deferred. LUNGS:  Clear to auscultation bilaterally.  No wheezes or rhonchi. HEART:  Regular rate and rhythm. No murmur appreciated. ABDOMEN:  Soft, nontender.  Positive, normoactive bowel sounds. No organomegaly palpated. MSK:  No focal spinal tenderness to palpation. Full range of motion bilaterally in the upper extremities. EXTREMITIES:  No peripheral edema.   SKIN:  Clear with no obvious rashes or skin changes. No nail dyscrasia. NEURO:  Nonfocal. Well oriented.  Appropriate affect.    LAB RESULTS:  CMP     Component Value Date/Time   NA 142 06/05/2020 0843   K 3.3 (L)  06/05/2020 0843   CL 107 06/05/2020 0843   CO2 26 06/05/2020 0843   GLUCOSE 108 (H) 06/05/2020 0843   BUN 10 06/05/2020 0843   CREATININE 0.74 06/05/2020 9794  CREATININE 0.81 01/11/2020 1209   CALCIUM 9.2 06/05/2020 0843   PROT 6.8 06/05/2020 0843   ALBUMIN 3.5 06/05/2020 0843   AST 16 06/05/2020 0843   AST 14 (L) 01/11/2020 1209   ALT 21 06/05/2020 0843   ALT 13 01/11/2020 1209   ALKPHOS 63 06/05/2020 0843   BILITOT 0.6 06/05/2020 0843   BILITOT 0.5 01/11/2020 1209   GFRNONAA >60 06/05/2020 0843   GFRNONAA >60 01/11/2020 1209   GFRAA >60 06/05/2020 0843   GFRAA >60 01/11/2020 1209    No results found for: TOTALPROTELP, ALBUMINELP, A1GS, A2GS, BETS, BETA2SER, GAMS, MSPIKE, SPEI  Lab Results  Component Value Date   WBC 4.5 06/05/2020   NEUTROABS 2.5 06/05/2020   HGB 10.7 (L) 06/05/2020   HCT 32.0 (L) 06/05/2020   MCV 89.9 06/05/2020   PLT 359 06/05/2020    No results found for: LABCA2  No components found for: [id number]  No results for input(s): INR in the last 168 hours.  No results found for: LABCA2  No results found for: NWG956  No results found for: OZH086  No results found for: VHQ469  No results found for: CA2729  No components found for: HGQUANT  No results found for: CEA1 / No results found for: CEA1   No results found for: AFPTUMOR  No results found for: CHROMOGRNA  No results found for: KPAFRELGTCHN, LAMBDASER, KAPLAMBRATIO (kappa/lambda light chains)  No results found for: HGBA, HGBA2QUANT, HGBFQUANT, HGBSQUAN (Hemoglobinopathy evaluation)   No results found for: LDH  No results found for: IRON, TIBC, IRONPCTSAT (Iron and TIBC)  No results found for: FERRITIN  Urinalysis    Component Value Date/Time   COLORURINE yellow 05/24/2010 0855   APPEARANCEUR Clear 05/24/2010 0855   LABSPEC 1.015 05/24/2010 0855   PHURINE 6.0 05/24/2010 0855   GLUCOSEU NEGATIVE 09/22/2007 1110   HGBUR negative 05/24/2010 0855   BILIRUBINUR n  06/29/2019 1533   KETONESUR NEGATIVE 09/22/2007 1110   PROTEINUR Negative 06/29/2019 1533   PROTEINUR NEGATIVE 09/22/2007 1110   UROBILINOGEN 0.2 06/29/2019 1533   UROBILINOGEN 0.2 05/24/2010 0855   NITRITE n 06/29/2019 1533   NITRITE negative 05/24/2010 0855   LEUKOCYTESUR Negative 06/29/2019 1533     STUDIES: ECHOCARDIOGRAM COMPLETE  Result Date: 05/30/2020    ECHOCARDIOGRAM REPORT   Patient Name:   Theresa Bradley Date of Exam: 05/30/2020 Medical Rec #:  [id number]       Height:       64.0 in Accession #:    [id number]      Weight:       149.7 lb Date of Birth:  1950-12-03        BSA:          1.730 m Patient Age:    69 years        BP:           146/70 mmHg Patient Gender: F               HR:           64 bpm. Exam Location:  Outpatient Procedure: 2D Echo and Strain Analysis Indications:    Chemo Z09  History:        Patient has prior history of Echocardiogram examinations, most                 recent 01/17/2020. Breast Cancer; Risk Factors:Hypertension.  Sonographer:    Mikki Santee RDCS (AE) Referring Phys: [facility]  1. Left ventricular  ejection fraction, by estimation, is 60 to 65%. The left ventricle has normal function. The left ventricle has no regional wall motion abnormalities. Left ventricular diastolic parameters are consistent with Grade I diastolic dysfunction (impaired relaxation). The average left ventricular global longitudinal strain is -21.8 %. The global longitudinal strain is normal.  2. Right ventricular systolic function is normal. The right ventricular size is normal. There is normal pulmonary artery systolic pressure. The estimated right ventricular systolic pressure is 00.3 mmHg.  3. The mitral valve is degenerative. Trivial mitral valve regurgitation. No evidence of mitral stenosis.  4. The aortic valve is tricuspid. Aortic valve regurgitation is not visualized. Mild aortic valve sclerosis is present, with no evidence of aortic valve stenosis.  5.  The inferior vena cava is normal in size with greater than 50% respiratory variability, suggesting right atrial pressure of 3 mmHg. Comparison(s): No significant change from prior study. FINDINGS  Left Ventricle: Left ventricular ejection fraction, by estimation, is 60 to 65%. The left ventricle has normal function. The left ventricle has no regional wall motion abnormalities. The average left ventricular global longitudinal strain is -21.8 %. The global longitudinal strain is normal. The left ventricular internal cavity size was normal in size. There is no left ventricular hypertrophy. Left ventricular diastolic parameters are consistent with Grade I diastolic dysfunction (impaired relaxation). Right Ventricle: The right ventricular size is normal. No increase in right ventricular wall thickness. Right ventricular systolic function is normal. There is normal pulmonary artery systolic pressure. The tricuspid regurgitant velocity is 2.47 m/s, and  with an assumed right atrial pressure of 3 mmHg, the estimated right ventricular systolic pressure is 70.4 mmHg. Left Atrium: Left atrial size was normal in size. Right Atrium: Right atrial size was normal in size. Pericardium: Trivial pericardial effusion is present. Mitral Valve: The mitral valve is degenerative in appearance. Mild mitral annular calcification. Trivial mitral valve regurgitation. No evidence of mitral valve stenosis. Tricuspid Valve: The tricuspid valve is grossly normal. Tricuspid valve regurgitation is mild . No evidence of tricuspid stenosis. Aortic Valve: The aortic valve is tricuspid. . There is mild thickening and mild calcification of the aortic valve. Aortic valve regurgitation is not visualized. Mild aortic valve sclerosis is present, with no evidence of aortic valve stenosis. There is mild thickening of the aortic valve. There is mild calcification of the aortic valve. Pulmonic Valve: The pulmonic valve was grossly normal. Pulmonic valve  regurgitation is trivial. No evidence of pulmonic stenosis. Aorta: The aortic root and ascending aorta are structurally normal, with no evidence of dilitation. Venous: The inferior vena cava is normal in size with greater than 50% respiratory variability, suggesting right atrial pressure of 3 mmHg. IAS/Shunts: The atrial septum is grossly normal.  LEFT VENTRICLE PLAX 2D LVIDd:         4.40 cm  Diastology LVIDs:         2.90 cm  LV e' lateral:   8.27 cm/s LV PW:         1.10 cm  LV E/e' lateral: 12.1 LV IVS:        1.10 cm  LV e' medial:    7.29 cm/s LVOT diam:     2.10 cm  LV E/e' medial:  13.7 LV SV:         86 LV SV Index:   50       2D Longitudinal Strain LVOT Area:     3.46 cm 2D Strain GLS Avg:     -21.8 %  RIGHT VENTRICLE RV S prime:     9.75 cm/s TAPSE (M-mode): 2.5 cm LEFT ATRIUM             Index       RIGHT ATRIUM           Index LA diam:        3.90 cm 2.25 cm/m  RA Area:     11.00 cm LA Vol (A2C):   45.4 ml 26.24 ml/m RA Volume:   20.80 ml  12.02 ml/m LA Vol (A4C):   44.7 ml 25.84 ml/m LA Biplane Vol: 46.7 ml 26.99 ml/m  AORTIC VALVE LVOT Vmax:   102.00 cm/s LVOT Vmean:  65.400 cm/s LVOT VTI:    0.248 m  AORTA Ao Root diam: 3.20 cm MITRAL VALVE                TRICUSPID VALVE MV Area (PHT): 3.65 cm     TR Peak grad:   24.4 mmHg MV Decel Time: 208 msec     TR Vmax:        247.00 cm/s MV E velocity: 100.00 cm/s MV A velocity: 93.50 cm/s   SHUNTS MV E/A ratio:  1.07         Systemic VTI:  0.25 m                             Systemic Diam: 2.10 cm Eleonore Chiquito MD Electronically signed by Eleonore Chiquito MD Signature Date/Time: 05/30/2020/8:30:17 PM    Final      ELIGIBLE FOR AVAILABLE RESEARCH PROTOCOL: no  ASSESSMENT: 69 y.o. [place] woman status Bradley right breast periareolar biopsy 12/30/2019 for a clinical T1a N0, stage I invasive ductal carcinoma, grade 2, estrogen and progesterone receptor positive, HER-2 amplified, with an MIB-one of 20%.  (1) history of prior right lumpectomy June 2009  for ductal carcinoma in situ  (a) status Bradley adjuvant radiation  (b) did not receive antiestrogens  (2) status Bradley repeat right lumpectomy and sentinel lymph node sampling 01/31/2020 for a pT1c pN0, stage IA invasive ductal carcinoma, grade 2, with positive margins  (a) total one sentinel node removed  (3) s/p right mastectomy 03/15/2020 showing residual invasive ductal carcinoma measuring 0.9 cm, but negative margins (added to prior 1.1 lumpectomy, still T1 N0 or stage IA)  (a) one additional axillary node was removed (total 2 right axillary nodes removed)  (3) adjuvant chemotherapy with paclitaxel and trastuzumab weekly x 12 started 04/24/2020 04/17/2020  (4) trastuzumab to be continued every 21 days to total one year  (a) echo 01/17/2020 shows an ejection fraction in the 60-65% range  (5) to start antiestrogens at the completion of local treatment.  (6) genetics testing 02/01/2020 through the [facility] Breast [facility] STAT panel found no deleterious mutations then ATM, BRCA1, BRCA2, CDH1, CHEK2, PALB2, PTEN, STK11 and TP53.    PLAN: [place] is halfway th  Total encounter time 20 minutes.Wilber Bihari, NP 06/06/20 11:08 PM Medical Oncology and Hematology [facility] [place], [place] [address] Tel. [phone]    Fax. [phone]    *Total Encounter Time as defined by the Centers for Medicare and Medicaid Services includes, in addition to the face-to-face time of a patient visit (documented in the note above) non-face-to-face time: obtaining and reviewing outside history, ordering and reviewing medications, tests or procedures, care coordination (communications with other health care professionals or caregivers) and documentation in the medical  record.

## 2020-06-07 ENCOUNTER — Other Ambulatory Visit: Payer: Self-pay | Admitting: Family Medicine

## 2020-06-08 DIAGNOSIS — J3089 Other allergic rhinitis: Secondary | ICD-10-CM | POA: Diagnosis not present

## 2020-06-08 DIAGNOSIS — J3081 Allergic rhinitis due to animal (cat) (dog) hair and dander: Secondary | ICD-10-CM | POA: Diagnosis not present

## 2020-06-08 DIAGNOSIS — J301 Allergic rhinitis due to pollen: Secondary | ICD-10-CM | POA: Diagnosis not present

## 2020-06-12 ENCOUNTER — Other Ambulatory Visit: Payer: Medicare Other

## 2020-06-12 ENCOUNTER — Inpatient Hospital Stay: Payer: Medicare Other

## 2020-06-12 ENCOUNTER — Encounter: Payer: Self-pay | Admitting: Nurse Practitioner

## 2020-06-12 ENCOUNTER — Inpatient Hospital Stay: Payer: Medicare Other | Admitting: Nurse Practitioner

## 2020-06-12 ENCOUNTER — Other Ambulatory Visit: Payer: Self-pay

## 2020-06-12 ENCOUNTER — Telehealth: Payer: Self-pay | Admitting: Nurse Practitioner

## 2020-06-12 VITALS — BP 135/69 | HR 60 | Temp 98.1°F | Resp 18 | Ht 64.0 in | Wt 149.4 lb

## 2020-06-12 DIAGNOSIS — Z170 Estrogen receptor positive status [ER+]: Secondary | ICD-10-CM

## 2020-06-12 DIAGNOSIS — C50411 Malignant neoplasm of upper-outer quadrant of right female breast: Secondary | ICD-10-CM | POA: Diagnosis not present

## 2020-06-12 DIAGNOSIS — Z5112 Encounter for antineoplastic immunotherapy: Secondary | ICD-10-CM | POA: Diagnosis not present

## 2020-06-12 DIAGNOSIS — C50111 Malignant neoplasm of central portion of right female breast: Secondary | ICD-10-CM | POA: Diagnosis not present

## 2020-06-12 DIAGNOSIS — Z95828 Presence of other vascular implants and grafts: Secondary | ICD-10-CM

## 2020-06-12 DIAGNOSIS — Z5111 Encounter for antineoplastic chemotherapy: Secondary | ICD-10-CM | POA: Diagnosis not present

## 2020-06-12 LAB — CBC WITH DIFFERENTIAL/PLATELET
Abs Immature Granulocytes: 0.04 10*3/uL (ref 0.00–0.07)
Basophils Absolute: 0.1 10*3/uL (ref 0.0–0.1)
Basophils Relative: 2 %
Eosinophils Absolute: 0.2 10*3/uL (ref 0.0–0.5)
Eosinophils Relative: 5 %
HCT: 32.9 % — ABNORMAL LOW (ref 36.0–46.0)
Hemoglobin: 11.1 g/dL — ABNORMAL LOW (ref 12.0–15.0)
Immature Granulocytes: 1 %
Lymphocytes Relative: 33 %
Lymphs Abs: 1.4 10*3/uL (ref 0.7–4.0)
MCH: 31.2 pg (ref 26.0–34.0)
MCHC: 33.7 g/dL (ref 30.0–36.0)
MCV: 92.4 fL (ref 80.0–100.0)
Monocytes Absolute: 0.3 10*3/uL (ref 0.1–1.0)
Monocytes Relative: 8 %
Neutro Abs: 2.3 10*3/uL (ref 1.7–7.7)
Neutrophils Relative %: 51 %
Platelets: 373 10*3/uL (ref 150–400)
RBC: 3.56 MIL/uL — ABNORMAL LOW (ref 3.87–5.11)
RDW: 14.3 % (ref 11.5–15.5)
WBC: 4.3 10*3/uL (ref 4.0–10.5)
nRBC: 0.5 % — ABNORMAL HIGH (ref 0.0–0.2)

## 2020-06-12 LAB — COMPREHENSIVE METABOLIC PANEL
ALT: 19 U/L (ref 0–44)
AST: 15 U/L (ref 15–41)
Albumin: 3.6 g/dL (ref 3.5–5.0)
Alkaline Phosphatase: 59 U/L (ref 38–126)
Anion gap: 9 (ref 5–15)
BUN: 8 mg/dL (ref 8–23)
CO2: 27 mmol/L (ref 22–32)
Calcium: 9 mg/dL (ref 8.9–10.3)
Chloride: 106 mmol/L (ref 98–111)
Creatinine, Ser: 0.79 mg/dL (ref 0.44–1.00)
GFR calc Af Amer: >60 mL/min (ref >60)
GFR calc non Af Amer: >60 mL/min (ref >60)
Glucose, Bld: 124 mg/dL — ABNORMAL HIGH (ref 70–99)
Potassium: 3.4 mmol/L — ABNORMAL LOW (ref 3.5–5.1)
Sodium: 142 mmol/L (ref 135–145)
Total Bilirubin: 0.4 mg/dL (ref 0.3–1.2)
Total Protein: 7 g/dL (ref 6.5–8.1)

## 2020-06-12 MED ORDER — FAMOTIDINE IN NACL 20-0.9 MG/50ML-% IV SOLN
INTRAVENOUS | Status: AC
  Filled 2020-06-12: qty 50

## 2020-06-12 MED ORDER — TRASTUZUMAB-DKST CHEMO 150 MG IV SOLR
150.0000 mg | Freq: Once | INTRAVENOUS | Status: AC
  Administered 2020-06-12: 150 mg via INTRAVENOUS
  Filled 2020-06-12: qty 7.14

## 2020-06-12 MED ORDER — SODIUM CHLORIDE 0.9% FLUSH
10.0000 mL | INTRAVENOUS | Status: DC | PRN
  Administered 2020-06-12: 10 mL
  Filled 2020-06-12: qty 10

## 2020-06-12 MED ORDER — DEXAMETHASONE SODIUM PHOSPHATE 10 MG/ML IJ SOLN
4.0000 mg | Freq: Once | INTRAMUSCULAR | Status: AC
  Administered 2020-06-12: 4 mg via INTRAVENOUS

## 2020-06-12 MED ORDER — DEXAMETHASONE SODIUM PHOSPHATE 10 MG/ML IJ SOLN
INTRAMUSCULAR | Status: AC
  Filled 2020-06-12: qty 1

## 2020-06-12 MED ORDER — SODIUM CHLORIDE 0.9% FLUSH
10.0000 mL | Freq: Once | INTRAVENOUS | Status: AC
  Administered 2020-06-12: 10 mL
  Filled 2020-06-12: qty 10

## 2020-06-12 MED ORDER — FAMOTIDINE IN NACL 20-0.9 MG/50ML-% IV SOLN
20.0000 mg | Freq: Once | INTRAVENOUS | Status: AC
  Administered 2020-06-12: 20 mg via INTRAVENOUS

## 2020-06-12 MED ORDER — SODIUM CHLORIDE 0.9 % IV SOLN
80.0000 mg/m2 | Freq: Once | INTRAVENOUS | Status: AC
  Administered 2020-06-12: 144 mg via INTRAVENOUS
  Filled 2020-06-12: qty 24

## 2020-06-12 MED ORDER — DIPHENHYDRAMINE HCL 50 MG/ML IJ SOLN
12.5000 mg | Freq: Once | INTRAMUSCULAR | Status: AC
  Administered 2020-06-12: 12.5 mg via INTRAVENOUS

## 2020-06-12 MED ORDER — ACETAMINOPHEN 325 MG PO TABS
ORAL_TABLET | ORAL | Status: AC
  Filled 2020-06-12: qty 2

## 2020-06-12 MED ORDER — HEPARIN SOD (PORK) LOCK FLUSH 100 UNIT/ML IV SOLN
500.0000 [IU] | Freq: Once | INTRAVENOUS | Status: AC | PRN
  Administered 2020-06-12: 500 [IU]
  Filled 2020-06-12: qty 5

## 2020-06-12 MED ORDER — SODIUM CHLORIDE 0.9 % IV SOLN
Freq: Once | INTRAVENOUS | Status: AC
  Filled 2020-06-12: qty 250

## 2020-06-12 MED ORDER — ACETAMINOPHEN 325 MG PO TABS
650.0000 mg | ORAL_TABLET | Freq: Once | ORAL | Status: AC
  Administered 2020-06-12: 650 mg via ORAL

## 2020-06-12 MED ORDER — DIPHENHYDRAMINE HCL 50 MG/ML IJ SOLN
INTRAMUSCULAR | Status: AC
  Filled 2020-06-12: qty 1

## 2020-06-12 NOTE — Telephone Encounter (Signed)
Scheduled per los. Declined printout  

## 2020-06-12 NOTE — Progress Notes (Signed)
[name]   [phone] [phone]   Clinic Follow up Note   Patient Care Team: [name], MD as PCP - General [name], [name], MD as Consulting Physician (Allergy and Immunology) Theresa Bradley, OD as Referring Physician [name], RN as Oncology Nurse Navigator [name], RN as Oncology Nurse Navigator [name], MD as Consulting Physician (General Surgery) [name], [name], MD as Consulting Physician (Oncology) [name], MD as Attending Physician (Radiation Oncology) [name], MD as Referring Physician (Dermatology) [name], MD as Consulting Physician (Cardiology) 06/12/2020  CHIEF COMPLAINT: Follow-up right breast cancer  SUMMARY OF ONCOLOGIC HISTORY: Oncology History  Malignant neoplasm of upper-outer quadrant of right breast in female, estrogen receptor positive ([place])  01/04/2020 Initial Diagnosis   Malignant neoplasm of upper-outer quadrant of right breast in female, estrogen receptor positive ([place])   01/31/2020 Cancer Staging   Staging form: Breast, AJCC 8th Edition - Pathologic stage from 01/31/2020: Stage IA (pT1c, pN0, cM0, G2, ER+, PR+, HER2+) - Signed by [name], NP on 02/15/2020   02/01/2020 Genetic Testing   Negative genetic testing:  No pathogenic variants detected on the [facility] Breast Cancer STAT panel. The report date is 02/01/2020.  The Breast Cancer STAT panel offered by [facility] includes sequencing and rearrangement analysis for the following 9 genes:  ATM, BRCA1, BRCA2, CDH1, CHEK2, PALB2, PTEN, STK11 and TP53.    04/24/2020 -  Chemotherapy   The patient had PACLitaxel (TAXOL) 144 mg in sodium chloride 0.9 % 250 mL chemo infusion (</= 48m/m2), 80 mg/m2 = 144 mg, Intravenous,  Once, 2 of 3 cycles Administration: 144 mg (04/24/2020), 144 mg (05/01/2020), 144 mg (05/22/2020), 144 mg (05/08/2020), 144 mg (05/15/2020), 144 mg (05/29/2020), 144 mg  (06/05/2020) trastuzumab-dkst (OGIVRI) 300 mg in sodium chloride 0.9 % 250 mL chemo infusion, 273 mg, Intravenous,  Once, 2 of 3 cycles Dose modification: 6 mg/kg (original dose 2 mg/kg, Cycle 3, Reason: Other (see comments), Comment: maintenance q3 week Ogivri to begin in 3 weeks) Administration: 300 mg (04/24/2020), 150 mg (05/01/2020), 150 mg (05/22/2020), 150 mg (05/08/2020), 150 mg (05/15/2020), 150 mg (05/29/2020), 150 mg (06/05/2020)  for chemotherapy treatment.    05/30/2020 -  Chemotherapy   The patient had trastuzumab-dkst (OGIVRI) 567 mg in sodium chloride 0.9 % 250 mL chemo infusion, 8 mg/kg = 567 mg, Intravenous,  Once, 0 of 9 cycles  for chemotherapy treatment.      CURRENT THERAPY: Adjuvant weekly Taxol and Herceptin x12 beginning 04/24/2020, followed by q3 weeks Herceptin to complete 1 year  INTERVAL HISTORY: Ms. HMikrutreturns for follow-up and treatment as scheduled.  She completed week 7 on 06/05/2020.  Her side effects are increasing at this point in her treatment.  She has more fatigue each week but remains out of bed, functional, and able to work.  Her appetite is decreased with taste change, she has 2 small sores on her lip, using Magic mouthwash which helps.  Not limiting p.o. intake.  She also has lichen planus.  She has intermittent diarrhea for 2 days after treatment with BM urgency.  Not taking antidiarrheals.  Denies nausea, vomiting, constipation.  No recent fever or chills.  She has a chronic dry cough and allergies, denies chest pain, dyspnea.  She has some blood-tinged nasal sputum.  She does not sleep well on treatment days.  Usually she is able to sleep with half tab Xanax but this is ineffective on days she receives  Decadron.  She feels she is not able to catch up on her tiredness.  Denies neuropathy, or pain. She has fluid at the right mastectomy site.   MEDICAL HISTORY:  Past Medical History:  Diagnosis Date  . Anxiety   . Breast cancer ([place]) 2009   right  . Cancer ([place])    . COUGH, CHRONIC 05/10/2010  . Dysrhythmia    occationally "skips a beat"  . Family history of brain cancer   . Family history of breast cancer   . Family history of lung cancer   . Family history of multiple myeloma   . Family history of skin cancer   . GERD (gastroesophageal reflux disease)   . HYPERTENSION 04/16/2009  . HYPOTHYROIDISM 04/16/2009  . OSTEOARTHRITIS 04/16/2009  . Personal history of radiation therapy   . SENILE LENTIGO 06/10/2010    SURGICAL HISTORY: Past Surgical History:  Procedure Laterality Date  . BREAST EXCISIONAL BIOPSY Right   . BREAST EXCISIONAL BIOPSY Left   . BREAST LUMPECTOMY Right 2009   radiation only  . BREAST LUMPECTOMY WITH RADIOACTIVE SEED AND SENTINEL LYMPH NODE BIOPSY Right 01/31/2020   Procedure: RIGHT BREAST LUMPECTOMY WITH RADIOACTIVE SEED AND SENTINEL LYMPH NODE BIOPSY;  Surgeon: [name], MD;  Location: [facility];  Service: General;  Laterality: Right;  PEC BLOCK  . BREAST SURGERY  2009   X 2, cancer stage 0, lumpectomy  . [place]  . CHOLECYSTECTOMY    . DILATATION & CURETTAGE/HYSTEROSCOPY WITH MYOSURE  11/2015  . HERNIA REPAIR  2009  . JOINT REPLACEMENT  2008   hip  . OPEN SURGICAL REPAIR OF GLUTEAL TENDON Left 07/18/2019   Procedure: Left hip bearing surface revision with gluteal tendon repair;  Surgeon: Gaynelle Arabian, MD;  Location: [facility];  Service: Orthopedics;  Laterality: Left;  2 hrs  . PORTACATH PLACEMENT N/A 03/15/2020   Procedure: INSERTION PORT-A-CATH WITH ULTRASOUND GUIDANCE;  Surgeon: [name], MD;  Location: [facility];  Service: General;  Laterality: N/A;  . SIMPLE MASTECTOMY WITH AXILLARY SENTINEL NODE BIOPSY Right 03/15/2020   Procedure: RIGHT SIMPLE MASTECTOMY;  Surgeon: [name], MD;  Location: [facility];  Service: General;  Laterality: Right;    I have reviewed the social history and family history with the patient and they are  unchanged from previous note.  ALLERGIES:  is allergic to codeine sulfate, erythromycin base, and esomeprazole magnesium.  MEDICATIONS:  Current Outpatient Medications  Medication Sig Dispense Refill  . ALPRAZolam (XANAX) 0.25 MG tablet TAKE 1/2 TABLET BY MOUTH AT BEDTIME AS NEEDED INSOMNIA 45 tablet 0  . amLODipine (NORVASC) 5 MG tablet TAKE 1 TABLET BY MOUTH EVERY DAY 90 tablet 3  . BIOTIN 5000 PO Take by mouth.    . Calcium Carbonate-Vitamin D 600-200 MG-UNIT TABS Take 1 tablet by mouth daily.    . citalopram (CELEXA) 20 MG tablet Take 1 tablet (20 mg total) by mouth daily. 90 tablet 3  . famotidine (PEPCID) 10 MG tablet Take 10 mg by mouth daily.     . fluocinonide gel (LIDEX) 0.05 %     . levothyroxine (SYNTHROID) 50 MCG tablet TAKE 1 TABLET BY MOUTH EVERY DAY 90 tablet 1  . lidocaine-prilocaine (EMLA) cream Apply to affected area once 30 g 3  . magic mouthwash SOLN Take 5 mLs by mouth 4 (four) times daily as needed for mouth pain. 240 mL 1  . meloxicam (MOBIC) 15 MG tablet  TAKE 1 TABLET BY MOUTH EVERY DAY 90 tablet 1  . mupirocin ointment (BACTROBAN) 2 % APPLY TO AFFECTED AREA EVERY DAY    . prochlorperazine (COMPAZINE) 10 MG tablet Take 1 tablet (10 mg total) by mouth every 6 (six) hours as needed (Nausea or vomiting). 30 tablet 1  . tretinoin (RETIN-A) 0.025 % cream Apply topically at bedtime. 45 g 6  . docusate sodium (COLACE) 100 MG capsule Take 100 mg by mouth daily as needed for mild constipation.     Marland Kitchen EPIPEN 2-PAK 0.3 MG/0.3ML SOAJ injection Inject 0.3 mg into the muscle as needed for anaphylaxis.      No current facility-administered medications for this visit.   Facility-Administered Medications Ordered in Other Visits  Medication Dose Route Frequency Provider Last Rate Last Admin  . heparin lock flush 100 unit/mL  500 Units Intracatheter Once PRN [name], [name], MD      . PACLitaxel (TAXOL) 144 mg in sodium chloride 0.9 % 250 mL chemo infusion (</= 75m/m2)  80 mg/m2  (Treatment Plan Recorded) Intravenous Once MChauncey Cruel MD 274 mL/hr at 06/12/20 1038 144 mg at 06/12/20 1038  . sodium chloride flush (NS) 0.9 % injection 10 mL  10 mL Intracatheter PRN [name], GVirgie Dad MD        PHYSICAL EXAMINATION: ECOG PERFORMANCE STATUS: 1 - Symptomatic but completely ambulatory  Vitals:   06/12/20 0822  BP: 135/69  Pulse: 60  Resp: 18  Temp: 98.1 F (36.7 C)  SpO2: 98%   Filed Weights   06/12/20 0822  Weight: 149 lb 6.4 oz (67.8 kg)    GENERAL:alert, no distress and comfortable SKIN: No rash to exposed skin, mild erythema under the port dressing EYES: sclera clear OROPHARYNX: Mucositis to upper and lower lip LUNGS:  normal breathing effort HEART:  no lower extremity edema NEURO: alert & oriented x 3 with fluent speech, no focal motor/sensory deficits BREAST: s/p right mastectomy.  Incisions completely healed.  Soft tissue edema with buoyancy below to the mastectomy incision extending to the lateral chest wall   LABORATORY DATA:  I have reviewed the data as listed CBC Latest Ref Rng & Units 06/12/2020 06/05/2020 05/29/2020  WBC 4.0 - 10.5 K/uL 4.3 4.5 4.6  Hemoglobin 12.0 - 15.0 g/dL 11.1(L) 10.7(L) 11.2(L)  Hematocrit 36 - 46 % 32.9(L) 32.0(L) 34.0(L)  Platelets 150 - 400 K/uL 373 359 351     CMP Latest Ref Rng & Units 06/12/2020 06/05/2020 05/29/2020  Glucose 70 - 99 mg/dL 124(H) 108(H) 93  BUN 8 - 23 mg/dL _0 Creatinine 0.44 - 1.00 mg/dL 0.79 0.74 0.76  Sodium 135 - 145 mmol/L 142 142 141  Potassium 3.5 - 5.1 mmol/L 3.4(L) 3.3(L) 3.5  Chloride 98 - 111 mmol/L 106 107 108  CO2 22 - 32 mmol/L _1 Calcium 8.9 - 10.3 mg/dL 9.0 9.2 9.8  Total Protein 6.5 - 8.1 g/dL 7.0 6.8 7.0  Total Bilirubin 0.3 - 1.2 mg/dL 0.4 0.6 0.6  Alkaline Phos 38 - 126 U/L 59 63 65  AST 15 - 41 U/L _2 ALT 0 - 44 U/L _3 RADIOGRAPHIC STUDIES: I have personally reviewed the radiological images as listed and agreed with the findings  in the report. No results found.   ASSESSMENT & PLAN: 69y.o. woman   1.  Malignant neoplasm of upper outer quadrant of right breast, invasive ductal carcinoma, grade 2, ER/PR positive,  HER-2 positive, MIB1-20% cT1aN0, stage I  -Diagnosed 12/30/2019.  Treated with right lumpectomy and sentinel lymph node biopsy on 01/31/2020, pT1cN0, stage I with positive margins.  Eventually underwent right mastectomy on 03/15/2020 showing residual invasive ductal carcinoma measuring 0.9 cm, negative margin -Began adjuvant chemotherapy with paclitaxel and trastuzumab weekly x12 on 04/24/2020, plan to continue maintenance Herceptin every 3 weeks to complete 1 year -Her treatment plan includes adjuvant antiestrogen therapy to follow chemo -[facility] breast cancer stat panel found no deleterious mutation on 02/01/2020 -Baseline echo 01/17/2020 showed an EF of 60-65%, abnormal GLS -15% -Repeat echo on 05/30/2020 showed EF 60-65%, normal GLS -20%   Disposition:  [name] appears stable.  She completed week 7 Taxol/Herceptin.  She tolerates treatment moderately well with fatigue, insomnia, diarrhea, and mucositis.  I recommend for her to take a whole tab Xanax 0.25 mg on treatment days for insomnia.  I recommend prophylactic Imodium in the morning for few days after chemo then PRN. She has mild hypokalemia likely related to diarrhea. I recommend to increase K rich diet.  She will continue Magic mouthwash for mucositis which is not limiting p.o. intake.  Symptoms adequately managed with supportive care at home, she is overall able to recover and function well.  Exam shows seroma below the right mastectomy incision.  I recommend referral back to Dr. Brantley Stage for possible drainage after she completes chemotherapy.  CBC and CMP are adequate for treatment.  Echo from 7/7 reviewed, EF 60-65% with normal GLS.  She will proceed with week 8 Taxol/Herceptin today.  She will return for follow-up and week 9 treatment on 7/27.  All  questions were answered. The patient knows to call the clinic with any problems, questions or concerns. No barriers to learning were detected.     Alla Feeling, NP 06/12/20

## 2020-06-12 NOTE — Patient Instructions (Signed)

## 2020-06-12 NOTE — Patient Instructions (Signed)
[facility] Discharge Instructions for Patients Receiving Chemotherapy  Today you received the following chemotherapy agents: trastuzumab and paclitaxel.  To help prevent nausea and vomiting after your treatment, we encourage you to take your nausea medication as directed.   If you develop nausea and vomiting that is not controlled by your nausea medication, call the clinic.   BELOW ARE SYMPTOMS THAT SHOULD BE REPORTED IMMEDIATELY:  *FEVER GREATER THAN 100.5 F  *CHILLS WITH OR WITHOUT FEVER  NAUSEA AND VOMITING THAT IS NOT CONTROLLED WITH YOUR NAUSEA MEDICATION  *UNUSUAL SHORTNESS OF BREATH  *UNUSUAL BRUISING OR BLEEDING  TENDERNESS IN MOUTH AND THROAT WITH OR WITHOUT PRESENCE OF ULCERS  *URINARY PROBLEMS  *BOWEL PROBLEMS  UNUSUAL RASH Items with * indicate a potential emergency and should be followed up as soon as possible.  Feel free to call the clinic should you have any questions or concerns. The clinic phone number is [phone].  Please show the CHEMO ALERT CARD at check-in to the Emergency Department and triage nurse.   

## 2020-06-17 NOTE — Progress Notes (Signed)
[place]  [phone] [phone]     ID: MAELI SPACEK DOB: [birth year]  MR#: [id number]  DVV#:[id number]  Patient Care Team: [name], MD as PCP - General [name], [name], MD as Consulting Physician (Allergy and Immunology) [name], OD as Referring Physician [name], RN as Oncology Nurse Navigator [name], RN as Oncology Nurse Navigator [name], MD as Consulting Physician (General Surgery) [name], [name], MD as Consulting Physician (Oncology) [name], MD as Attending Physician (Radiation Oncology) [name], MD as Referring Physician (Dermatology) [name], MD as Consulting Physician (Cardiology) Scot Dock, NP OTHER MD:  CHIEF COMPLAINT: triple positive breast cancer (s/p right mastectomy)  CURRENT TREATMENT: weekly paclitaxel x12; trastuzumab to complete a year   INTERVAL HISTORY: Theresa Bradley" returns today for follow up and treatment of her triple positive breast cancer unaccompanied.   She started on weekly paclitaxel and trastuzumab on 04/24/2020. Today is week 9. She in particular denies any peripheral neuropathy.   Her most recent echocardiogram was completed on 05/30/2020 and showed an EF of 60-65%.      REVIEW OF SYSTEMS: Theresa Bradley notes a cumulative fatigue.  She notes she is getting tired of this, but is looking forward to the end in sight.  She does have difficulty sleeping at night and notes that she tried a whole xanax last week and that was not helpful.  She has no other difficulty sleeping throughout the rest of the week.  She notes some mild indigestion and this is relieved with Pepcid.  Theresa Bradley denies any fever, chills, chest pain, palpitations, cough, bowel/bladder changes, headaches, vision issues, nausea, vomiting, or any other concerns.  A detailed ROS was otherwise non contributory.     HISTORY OF CURRENT ILLNESS: From the original intake  note:   Theresa Bradley was my patient a little over 11 years ago when she underwent lumpectomy on 05/15/2008 for ductal carcinoma in situ and received radiation therapy under Dr. Valere Dross. 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  More recently, she presented to her PCP with a small palpable right breast lump around 11 o'clock, just superior and lateral to the nipple. She underwent bilateral diagnostic mammography with tomography and right breast ultrasonography at [facility] on 12/26/2019 showing: breast density category B; 6 mm superficial mass in the right breast at 11 o'clock in the retroareolar region; no enlarged adenopathy in right axilla.  Accordingly on 12/30/2019 she proceeded to biopsy of the right breast area in question. The pathology from this procedure ([id number]) showed: invasive ductal carcinoma, grade 2. Prognostic indicators significant for: estrogen receptor, 95% positive and progesterone receptor, 80% positive, both with strong staining intensity. Proliferation marker Ki67 at 20%. HER2 positive by immunohistochemistry (3+).  The patient's subsequent history is as detailed below.   PAST MEDICAL HISTORY: Past Medical History:  Diagnosis Date  . Anxiety   . Breast cancer ([place]) 2009   right  . Cancer ([place])   . COUGH, CHRONIC 05/10/2010  . Dysrhythmia    occationally "skips a beat"  . Family history of brain cancer   . Family history of breast cancer   . Family history of lung cancer   . Family history of multiple myeloma   . Family history of skin cancer   . GERD (gastroesophageal reflux disease)   .  HYPERTENSION 04/16/2009  . HYPOTHYROIDISM 04/16/2009  . OSTEOARTHRITIS 04/16/2009  . Personal history of radiation therapy   . SENILE LENTIGO 06/10/2010    PAST SURGICAL HISTORY: Past Surgical History:  Procedure Laterality Date  . BREAST EXCISIONAL BIOPSY Right   .  BREAST EXCISIONAL BIOPSY Left   . BREAST LUMPECTOMY Right 2009   radiation only  . BREAST LUMPECTOMY WITH RADIOACTIVE SEED AND SENTINEL LYMPH NODE BIOPSY Right 01/31/2020   Procedure: RIGHT BREAST LUMPECTOMY WITH RADIOACTIVE SEED AND SENTINEL LYMPH NODE BIOPSY;  Surgeon: [name], MD;  Location: [facility];  Service: General;  Laterality: Right;  PEC BLOCK  . BREAST SURGERY  2009   X 2, cancer stage 0, lumpectomy  . [place]  . CHOLECYSTECTOMY    . DILATATION & CURETTAGE/HYSTEROSCOPY WITH MYOSURE  11/2015  . HERNIA REPAIR  2009  . JOINT REPLACEMENT  2008   hip  . OPEN SURGICAL REPAIR OF GLUTEAL TENDON Left 07/18/2019   Procedure: Left hip bearing surface revision with gluteal tendon repair;  Surgeon: Gaynelle Arabian, MD;  Location: [facility];  Service: Orthopedics;  Laterality: Left;  2 hrs  . PORTACATH PLACEMENT N/A 03/15/2020   Procedure: INSERTION PORT-A-CATH WITH ULTRASOUND GUIDANCE;  Surgeon: [name], MD;  Location: [facility];  Service: General;  Laterality: N/A;  . SIMPLE MASTECTOMY WITH AXILLARY SENTINEL NODE BIOPSY Right 03/15/2020   Procedure: RIGHT SIMPLE MASTECTOMY;  Surgeon: [name], MD;  Location: [facility];  Service: General;  Laterality: Right;    FAMILY HISTORY: Family History  Problem Relation Age of Onset  . Arthritis Other   . Hypertension Other   . Cancer Other        2 aunts  . Hyperlipidemia Father   . Dementia Father   . Skin cancer Father   . Skin cancer Mother   . Breast cancer Maternal Aunt 63  . Breast cancer Maternal Aunt        dx. mid-70s  . Lung cancer Paternal Uncle   . Multiple myeloma Paternal Uncle   . Cancer Paternal Aunt        unknown type, dx. >50  . Brain cancer Cousin        dx. 45s, paternal cousin   The patient's father is 3 years old and the patient's mother is 64 years old as of February 2021.  The patient has one brother, no sisters.  A maternal  aunt was diagnosed with breast cancer at age 22.  A paternal uncle was diagnosed with lung cancer in his 20s and another paternal uncle with a "blood cancer" in his late 66s.  There is no history of ovarian pancreatic or prostate cancer in the family to her knowledge.   GYNECOLOGIC HISTORY:  No LMP recorded. Patient is postmenopausal. Menarche: 69 years old Age at first live birth: 69 years old Ritchie P 2 LMP HRT no  Hysterectomy?  No BSO?  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  Her husband Patrick Jupiter runs and environmental company that for example checks for mold and then remediate.  Son Delfino Lovett, 76 years old, lives in [place] and works as a Clinical biochemist.  He has 2 sons.  The patient's son Catalina Antigua 44 lives in [place] and works in Chief Executive Officer.  He has no children.  The patient is Episcopalian  ADVANCED DIRECTIVES: In the absence of any documentation to the contrary, the patient's spouse is their HCPOA.    HEALTH MAINTENANCE: Social History   Tobacco Use  . Smoking status: Never Smoker  . Smokeless tobacco: Never Used  . Tobacco comment: father smoked when she was younger   Vaping Use  . Vaping Use: Never used  Substance Use Topics  . Alcohol use: Yes    Comment: occasionally   . Drug use: No     Colonoscopy: 03/2009/[facility]  PAP: 10/2013, negative  Bone density: 11/2015, T score -1.7   Allergies  Allergen Reactions  . Codeine Sulfate Nausea And Vomiting  . Erythromycin Base Other (See Comments)    Stomach ache  . Esomeprazole Magnesium     REACTION: GI upset    Current Outpatient Medications  Medication Sig Dispense Refill  . ALPRAZolam (XANAX) 0.25 MG tablet TAKE 1/2 TABLET BY MOUTH AT BEDTIME AS NEEDED INSOMNIA 45 tablet 0  . amLODipine (NORVASC) 5 MG tablet TAKE 1 TABLET BY MOUTH EVERY DAY 90 tablet 3  . BIOTIN 5000 PO Take by mouth.    . Calcium  Carbonate-Vitamin D 600-200 MG-UNIT TABS Take 1 tablet by mouth daily.    . citalopram (CELEXA) 20 MG tablet Take 1 tablet (20 mg total) by mouth daily. 90 tablet 3  . docusate sodium (COLACE) 100 MG capsule Take 100 mg by mouth daily as needed for mild constipation.     Marland Kitchen EPIPEN 2-PAK 0.3 MG/0.3ML SOAJ injection Inject 0.3 mg into the muscle as needed for anaphylaxis.     . famotidine (PEPCID) 10 MG tablet Take 10 mg by mouth daily.     . fluocinonide gel (LIDEX) 0.05 %     . levothyroxine (SYNTHROID) 50 MCG tablet TAKE 1 TABLET BY MOUTH EVERY DAY 90 tablet 1  . lidocaine-prilocaine (EMLA) cream Apply to affected area once 30 g 3  . magic mouthwash SOLN Take 5 mLs by mouth 4 (four) times daily as needed for mouth pain. 240 mL 1  . meloxicam (MOBIC) 15 MG tablet TAKE 1 TABLET BY MOUTH EVERY DAY 90 tablet 1  . mupirocin ointment (BACTROBAN) 2 % APPLY TO AFFECTED AREA EVERY DAY    . prochlorperazine (COMPAZINE) 10 MG tablet Take 1 tablet (10 mg total) by mouth every 6 (six) hours as needed (Nausea or vomiting). 30 tablet 1  . tretinoin (RETIN-A) 0.025 % cream Apply topically at bedtime. 45 g 6   No current facility-administered medications for this visit.    OBJECTIVE:   Vitals:   06/19/20 0833  BP: (!) 132/72  Pulse: 59  Resp: 18  Temp: 98.5 F (36.9 C)  SpO2: 98%     Body mass index is 25.64 kg/m.   Wt Readings from Last 3 Encounters:  06/19/20 149 lb 6.4 oz (67.8 kg)  06/12/20 149 lb 6.4 oz (67.8 kg)  06/05/20 150 lb 12 oz (68.4 kg)  For vitals on 05/29/2020 see the infusion area flowsheet   ECOG FS:1 - Symptomatic but completely ambulatory  GENERAL: Patient is a well appearing female in no acute distress HEENT:  Sclerae anicteric.  Mask in place. Neck is supple.  NODES:  No cervical, supraclavicular, or axillary lymphadenopathy palpated.  BREAST EXAM:  Deferred. LUNGS:  Clear to auscultation bilaterally.  No wheezes or rhonchi. HEART:  Regular rate and rhythm. No murmur  appreciated. ABDOMEN:  Soft, nontender.  Positive, normoactive bowel sounds. No organomegaly palpated. MSK:  No focal spinal tenderness to  palpation.  EXTREMITIES:  No peripheral edema.   SKIN:  Clear with no obvious rashes or skin changes. No nail dyscrasia. NEURO:  Nonfocal. Well oriented.  Appropriate affect.     LAB RESULTS:  CMP     Component Value Date/Time   NA 141 06/19/2020 0807   K 3.6 06/19/2020 0807   CL 106 06/19/2020 0807   CO2 28 06/19/2020 0807   GLUCOSE 115 (H) 06/19/2020 0807   BUN 9 06/19/2020 0807   CREATININE 0.79 06/19/2020 0807   CREATININE 0.81 01/11/2020 1209   CALCIUM 9.5 06/19/2020 0807   PROT 6.8 06/19/2020 0807   ALBUMIN 3.6 06/19/2020 0807   AST 15 06/19/2020 0807   AST 14 (L) 01/11/2020 1209   ALT 20 06/19/2020 0807   ALT 13 01/11/2020 1209   ALKPHOS 57 06/19/2020 0807   BILITOT 0.6 06/19/2020 0807   BILITOT 0.5 01/11/2020 1209   GFRNONAA >60 06/19/2020 0807   GFRNONAA >60 01/11/2020 1209   GFRAA >60 06/19/2020 0807   GFRAA >60 01/11/2020 1209    No results found for: TOTALPROTELP, ALBUMINELP, A1GS, A2GS, BETS, BETA2SER, GAMS, MSPIKE, SPEI  Lab Results  Component Value Date   WBC 4.3 06/19/2020   NEUTROABS 2.4 06/19/2020   HGB 10.7 (L) 06/19/2020   HCT 32.7 (L) 06/19/2020   MCV 92.4 06/19/2020   PLT 364 06/19/2020    No results found for: LABCA2  No components found for: [id number]  No results for input(s): INR in the last 168 hours.  No results found for: LABCA2  No results found for: FXT024  No results found for: OXB353  No results found for: GDJ242  No results found for: CA2729  No components found for: HGQUANT  No results found for: CEA1 / No results found for: CEA1   No results found for: AFPTUMOR  No results found for: CHROMOGRNA  No results found for: KPAFRELGTCHN, LAMBDASER, KAPLAMBRATIO (kappa/lambda light chains)  No results found for: HGBA, HGBA2QUANT, HGBFQUANT, HGBSQUAN (Hemoglobinopathy  evaluation)   No results found for: LDH  No results found for: IRON, TIBC, IRONPCTSAT (Iron and TIBC)  No results found for: FERRITIN  Urinalysis    Component Value Date/Time   COLORURINE yellow 05/24/2010 0855   APPEARANCEUR Clear 05/24/2010 0855   LABSPEC 1.015 05/24/2010 0855   PHURINE 6.0 05/24/2010 0855   GLUCOSEU NEGATIVE 09/22/2007 1110   HGBUR negative 05/24/2010 0855   BILIRUBINUR n 06/29/2019 1533   KETONESUR NEGATIVE 09/22/2007 1110   PROTEINUR Negative 06/29/2019 1533   PROTEINUR NEGATIVE 09/22/2007 1110   UROBILINOGEN 0.2 06/29/2019 1533   UROBILINOGEN 0.2 05/24/2010 0855   NITRITE n 06/29/2019 1533   NITRITE negative 05/24/2010 0855   LEUKOCYTESUR Negative 06/29/2019 1533     STUDIES: ECHOCARDIOGRAM COMPLETE  Result Date: 05/30/2020    ECHOCARDIOGRAM REPORT   Patient Name:   MAZELLA DEEN Date of Exam: 05/30/2020 Medical Rec #:  [id number]       Height:       64.0 in Accession #:    [id number]      Weight:       149.7 lb Date of Birth:  Sep 01, 1951        BSA:          1.730 m Patient Age:    60 years        BP:           146/70 mmHg Patient Gender: F  HR:           64 bpm. Exam Location:  Outpatient Procedure: 2D Echo and Strain Analysis Indications:    Chemo Z09  History:        Patient has prior history of Echocardiogram examinations, most                 recent 01/17/2020. Breast Cancer; Risk Factors:Hypertension.  Sonographer:    Mikki Santee RDCS (AE) Referring Phys: [place]  1. Left ventricular ejection fraction, by estimation, is 60 to 65%. The left ventricle has normal function. The left ventricle has no regional wall motion abnormalities. Left ventricular diastolic parameters are consistent with Grade I diastolic dysfunction (impaired relaxation). The average left ventricular global longitudinal strain is -21.8 %. The global longitudinal strain is normal.  2. Right ventricular systolic function is normal. The right  ventricular size is normal. There is normal pulmonary artery systolic pressure. The estimated right ventricular systolic pressure is 12.8 mmHg.  3. The mitral valve is degenerative. Trivial mitral valve regurgitation. No evidence of mitral stenosis.  4. The aortic valve is tricuspid. Aortic valve regurgitation is not visualized. Mild aortic valve sclerosis is present, with no evidence of aortic valve stenosis.  5. The inferior vena cava is normal in size with greater than 50% respiratory variability, suggesting right atrial pressure of 3 mmHg. Comparison(s): No significant change from prior study. FINDINGS  Left Ventricle: Left ventricular ejection fraction, by estimation, is 60 to 65%. The left ventricle has normal function. The left ventricle has no regional wall motion abnormalities. The average left ventricular global longitudinal strain is -21.8 %. The global longitudinal strain is normal. The left ventricular internal cavity size was normal in size. There is no left ventricular hypertrophy. Left ventricular diastolic parameters are consistent with Grade I diastolic dysfunction (impaired relaxation). Right Ventricle: The right ventricular size is normal. No increase in right ventricular wall thickness. Right ventricular systolic function is normal. There is normal pulmonary artery systolic pressure. The tricuspid regurgitant velocity is 2.47 m/s, and  with an assumed right atrial pressure of 3 mmHg, the estimated right ventricular systolic pressure is 11.8 mmHg. Left Atrium: Left atrial size was normal in size. Right Atrium: Right atrial size was normal in size. Pericardium: Trivial pericardial effusion is present. Mitral Valve: The mitral valve is degenerative in appearance. Mild mitral annular calcification. Trivial mitral valve regurgitation. No evidence of mitral valve stenosis. Tricuspid Valve: The tricuspid valve is grossly normal. Tricuspid valve regurgitation is mild . No evidence of tricuspid stenosis.  Aortic Valve: The aortic valve is tricuspid. . There is mild thickening and mild calcification of the aortic valve. Aortic valve regurgitation is not visualized. Mild aortic valve sclerosis is present, with no evidence of aortic valve stenosis. There is mild thickening of the aortic valve. There is mild calcification of the aortic valve. Pulmonic Valve: The pulmonic valve was grossly normal. Pulmonic valve regurgitation is trivial. No evidence of pulmonic stenosis. Aorta: The aortic root and ascending aorta are structurally normal, with no evidence of dilitation. Venous: The inferior vena cava is normal in size with greater than 50% respiratory variability, suggesting right atrial pressure of 3 mmHg. IAS/Shunts: The atrial septum is grossly normal.  LEFT VENTRICLE PLAX 2D LVIDd:         4.40 cm  Diastology LVIDs:         2.90 cm  LV e' lateral:   8.27 cm/s LV PW:  1.10 cm  LV E/e' lateral: 12.1 LV IVS:        1.10 cm  LV e' medial:    7.29 cm/s LVOT diam:     2.10 cm  LV E/e' medial:  13.7 LV SV:         86 LV SV Index:   50       2D Longitudinal Strain LVOT Area:     3.46 cm 2D Strain GLS Avg:     -21.8 %  RIGHT VENTRICLE RV S prime:     9.75 cm/s TAPSE (M-mode): 2.5 cm LEFT ATRIUM             Index       RIGHT ATRIUM           Index LA diam:        3.90 cm 2.25 cm/m  RA Area:     11.00 cm LA Vol (A2C):   45.4 ml 26.24 ml/m RA Volume:   20.80 ml  12.02 ml/m LA Vol (A4C):   44.7 ml 25.84 ml/m LA Biplane Vol: 46.7 ml 26.99 ml/m  AORTIC VALVE LVOT Vmax:   102.00 cm/s LVOT Vmean:  65.400 cm/s LVOT VTI:    0.248 m  AORTA Ao Root diam: 3.20 cm MITRAL VALVE                TRICUSPID VALVE MV Area (PHT): 3.65 cm     TR Peak grad:   24.4 mmHg MV Decel Time: 208 msec     TR Vmax:        247.00 cm/s MV E velocity: 100.00 cm/s MV A velocity: 93.50 cm/s   SHUNTS MV E/A ratio:  1.07         Systemic VTI:  0.25 m                             Systemic Diam: 2.10 cm Eleonore Chiquito MD Electronically signed by Eleonore Chiquito MD Signature Date/Time: 05/30/2020/8:30:17 PM    Final      ELIGIBLE FOR AVAILABLE RESEARCH PROTOCOL: no  ASSESSMENT: 69 y.o. [place] woman status post right breast periareolar biopsy 12/30/2019 for a clinical T1a N0, stage I invasive ductal carcinoma, grade 2, estrogen and progesterone receptor positive, HER-2 amplified, with an MIB-one of 20%.  (1) history of prior right lumpectomy June 2009 for ductal carcinoma in situ  (a) status post adjuvant radiation  (b) did not receive antiestrogens  (2) status post repeat right lumpectomy and sentinel lymph node sampling 01/31/2020 for a pT1c pN0, stage IA invasive ductal carcinoma, grade 2, with positive margins  (a) total one sentinel node removed  (3) s/p right mastectomy 03/15/2020 showing residual invasive ductal carcinoma measuring 0.9 cm, but negative margins (added to prior 1.1 lumpectomy, still T1 N0 or stage IA)  (a) one additional axillary node was removed (total 2 right axillary nodes removed)  (3) adjuvant chemotherapy with paclitaxel and trastuzumab weekly x 12 started 04/24/2020 04/17/2020  (4) trastuzumab to be continued every 21 days to total one year  (a) echo 01/17/2020 shows an ejection fraction in the 60-65% range  (5) to start antiestrogens at the completion of local treatment.  (a) Bone density in 11/2015 shows osteopenia with T score of -1.7.  (6) genetics testing 02/01/2020 through the [facility] Breast Cancer STAT panel found no deleterious mutations then ATM, BRCA1, BRCA2, CDH1, CHEK2, PALB2, PTEN, STK11 and TP53.    PLAN: Sharae  continues on adjuvant chemotherapy with Pacltiaxel and Trastuzumab with good tolerance.  She is due for week 9 today and her labs are within parameters to receive treatment.  She has not developed peripheral neuropathy at this point, which is great.    Kaydee has some difficulty sleeping the nights after her chemotherapy.  She has tried many different things to help, and hasn't been  able to find a solution.  This is secondary to her Dexamethasone IV, which is at the lowest dose (77m) that it can possibly be.  I cannot lower the dose further, however hopefully some brief intermittent exercise throughout the day will help.    JJaslyneand I talked about her indigestion, and she will continue the Pepcid for this as she needs to.  This is helping her with the issue.    [name]'s fatigue is a cumulative effect of the chemotherapy.  We discussed energy conservation alternating with brief 5 minute exercise with a walk that can help combat fatigue.  She understands that after chemotherapy completion, the fatigue should slowly improve.    [name] and I discussed next steps once her chemotherapy is completed which includes starting anti estrogen therapy with either Tamoxifen or Anastrozole.  We talked about the side effects of both of these in detail.  I reviewed her most recent bone density which was completed in January, 2017 and showed osteopenia.  I discussed bone density and how the Anastrozole can decrease it, and tamoxifen can increase it.  We will repeat this int he next few weeks, to have more information when she completes chemotherapy and decides on which anti estrogen therapy to take.    JBreleighunderstands the above.  We will see her on a weekly basis for labs, f/u, and her treatment until she completes her chemotherapy.  She knows to call for any questions that may arise between now and her next appointment.  We are happy to see her sooner if needed.   Total encounter time 30 minutes.*Wilber Bihari NP 06/19/20 9:09 AM [facility]  [id number]. [phone]   Fax. [phone]    *Total Encounter Time as defined by the Centers for Medicare and Medicaid Services includes, in addition to the face-to-face time of a patient visit (documented in the note above) non-face-to-face time: obtaining  and reviewing outside history, ordering and reviewing medications, tests or procedures, care coordination (communications with other health care professionals or caregivers) and documentation in the medical record.

## 2020-06-19 ENCOUNTER — Other Ambulatory Visit: Payer: Self-pay

## 2020-06-19 ENCOUNTER — Encounter: Payer: Self-pay | Admitting: Adult Health

## 2020-06-19 ENCOUNTER — Inpatient Hospital Stay: Payer: Medicare Other

## 2020-06-19 ENCOUNTER — Other Ambulatory Visit: Payer: Medicare Other

## 2020-06-19 ENCOUNTER — Inpatient Hospital Stay: Payer: Medicare Other | Admitting: Adult Health

## 2020-06-19 VITALS — BP 132/72 | HR 59 | Temp 98.5°F | Resp 18 | Ht 64.0 in | Wt 149.4 lb

## 2020-06-19 DIAGNOSIS — C50411 Malignant neoplasm of upper-outer quadrant of right female breast: Secondary | ICD-10-CM | POA: Diagnosis not present

## 2020-06-19 DIAGNOSIS — Z95828 Presence of other vascular implants and grafts: Secondary | ICD-10-CM

## 2020-06-19 DIAGNOSIS — Z170 Estrogen receptor positive status [ER+]: Secondary | ICD-10-CM

## 2020-06-19 DIAGNOSIS — Z5112 Encounter for antineoplastic immunotherapy: Secondary | ICD-10-CM | POA: Diagnosis not present

## 2020-06-19 DIAGNOSIS — Z5111 Encounter for antineoplastic chemotherapy: Secondary | ICD-10-CM | POA: Diagnosis not present

## 2020-06-19 DIAGNOSIS — C50111 Malignant neoplasm of central portion of right female breast: Secondary | ICD-10-CM | POA: Diagnosis not present

## 2020-06-19 DIAGNOSIS — E2839 Other primary ovarian failure: Secondary | ICD-10-CM

## 2020-06-19 LAB — CBC WITH DIFFERENTIAL/PLATELET
Abs Immature Granulocytes: 0.03 10*3/uL (ref 0.00–0.07)
Basophils Absolute: 0.1 10*3/uL (ref 0.0–0.1)
Basophils Relative: 1 %
Eosinophils Absolute: 0.2 10*3/uL (ref 0.0–0.5)
Eosinophils Relative: 4 %
HCT: 32.7 % — ABNORMAL LOW (ref 36.0–46.0)
Hemoglobin: 10.7 g/dL — ABNORMAL LOW (ref 12.0–15.0)
Immature Granulocytes: 1 %
Lymphocytes Relative: 29 %
Lymphs Abs: 1.3 10*3/uL (ref 0.7–4.0)
MCH: 30.2 pg (ref 26.0–34.0)
MCHC: 32.7 g/dL (ref 30.0–36.0)
MCV: 92.4 fL (ref 80.0–100.0)
Monocytes Absolute: 0.4 10*3/uL (ref 0.1–1.0)
Monocytes Relative: 10 %
Neutro Abs: 2.4 10*3/uL (ref 1.7–7.7)
Neutrophils Relative %: 55 %
Platelets: 364 10*3/uL (ref 150–400)
RBC: 3.54 MIL/uL — ABNORMAL LOW (ref 3.87–5.11)
RDW: 14.6 % (ref 11.5–15.5)
WBC: 4.3 10*3/uL (ref 4.0–10.5)
nRBC: 0.5 % — ABNORMAL HIGH (ref 0.0–0.2)

## 2020-06-19 LAB — COMPREHENSIVE METABOLIC PANEL
ALT: 20 U/L (ref 0–44)
AST: 15 U/L (ref 15–41)
Albumin: 3.6 g/dL (ref 3.5–5.0)
Alkaline Phosphatase: 57 U/L (ref 38–126)
Anion gap: 7 (ref 5–15)
BUN: 9 mg/dL (ref 8–23)
CO2: 28 mmol/L (ref 22–32)
Calcium: 9.5 mg/dL (ref 8.9–10.3)
Chloride: 106 mmol/L (ref 98–111)
Creatinine, Ser: 0.79 mg/dL (ref 0.44–1.00)
GFR calc Af Amer: >60 mL/min (ref >60)
GFR calc non Af Amer: >60 mL/min (ref >60)
Glucose, Bld: 115 mg/dL — ABNORMAL HIGH (ref 70–99)
Potassium: 3.6 mmol/L (ref 3.5–5.1)
Sodium: 141 mmol/L (ref 135–145)
Total Bilirubin: 0.6 mg/dL (ref 0.3–1.2)
Total Protein: 6.8 g/dL (ref 6.5–8.1)

## 2020-06-19 MED ORDER — HEPARIN SOD (PORK) LOCK FLUSH 100 UNIT/ML IV SOLN
500.0000 [IU] | Freq: Once | INTRAVENOUS | Status: AC | PRN
  Administered 2020-06-19: 500 [IU]
  Filled 2020-06-19: qty 5

## 2020-06-19 MED ORDER — DEXAMETHASONE SODIUM PHOSPHATE 10 MG/ML IJ SOLN
4.0000 mg | Freq: Once | INTRAMUSCULAR | Status: AC
  Administered 2020-06-19: 4 mg via INTRAVENOUS

## 2020-06-19 MED ORDER — TRASTUZUMAB-DKST CHEMO 150 MG IV SOLR
150.0000 mg | Freq: Once | INTRAVENOUS | Status: AC
  Administered 2020-06-19: 150 mg via INTRAVENOUS
  Filled 2020-06-19: qty 7.14

## 2020-06-19 MED ORDER — ACETAMINOPHEN 325 MG PO TABS
ORAL_TABLET | ORAL | Status: AC
  Filled 2020-06-19: qty 2

## 2020-06-19 MED ORDER — DIPHENHYDRAMINE HCL 50 MG/ML IJ SOLN
12.5000 mg | Freq: Once | INTRAMUSCULAR | Status: AC
  Administered 2020-06-19: 12.5 mg via INTRAVENOUS

## 2020-06-19 MED ORDER — SODIUM CHLORIDE 0.9 % IV SOLN
80.0000 mg/m2 | Freq: Once | INTRAVENOUS | Status: AC
  Administered 2020-06-19: 144 mg via INTRAVENOUS
  Filled 2020-06-19: qty 24

## 2020-06-19 MED ORDER — FAMOTIDINE IN NACL 20-0.9 MG/50ML-% IV SOLN
20.0000 mg | Freq: Once | INTRAVENOUS | Status: AC
  Administered 2020-06-19: 20 mg via INTRAVENOUS

## 2020-06-19 MED ORDER — SODIUM CHLORIDE 0.9% FLUSH
10.0000 mL | INTRAVENOUS | Status: DC | PRN
  Administered 2020-06-19: 10 mL
  Filled 2020-06-19: qty 10

## 2020-06-19 MED ORDER — ACETAMINOPHEN 325 MG PO TABS
650.0000 mg | ORAL_TABLET | Freq: Once | ORAL | Status: AC
  Administered 2020-06-19: 650 mg via ORAL

## 2020-06-19 MED ORDER — FAMOTIDINE IN NACL 20-0.9 MG/50ML-% IV SOLN
INTRAVENOUS | Status: AC
  Filled 2020-06-19: qty 50

## 2020-06-19 MED ORDER — DIPHENHYDRAMINE HCL 50 MG/ML IJ SOLN
INTRAMUSCULAR | Status: AC
  Filled 2020-06-19: qty 1

## 2020-06-19 MED ORDER — SODIUM CHLORIDE 0.9% FLUSH
10.0000 mL | Freq: Once | INTRAVENOUS | Status: AC
  Administered 2020-06-19: 10 mL
  Filled 2020-06-19: qty 10

## 2020-06-19 MED ORDER — SODIUM CHLORIDE 0.9 % IV SOLN
Freq: Once | INTRAVENOUS | Status: AC
  Filled 2020-06-19: qty 250

## 2020-06-19 MED ORDER — DEXAMETHASONE SODIUM PHOSPHATE 10 MG/ML IJ SOLN
INTRAMUSCULAR | Status: AC
  Filled 2020-06-19: qty 1

## 2020-06-19 NOTE — Patient Instructions (Signed)

## 2020-06-19 NOTE — Patient Instructions (Signed)
[facility] Discharge Instructions for Patients Receiving Chemotherapy  Today you received the following chemotherapy agents :  Paclitaxel & immunotherapy:  Trastuzumab.  To help prevent nausea and vomiting after your treatment, we encourage you to take your nausea medication as prescribed.If you develop nausea and vomiting that is not controlled by your nausea medication, call the clinic.   BELOW ARE SYMPTOMS THAT SHOULD BE REPORTED IMMEDIATELY:  *FEVER GREATER THAN 100.5 F  *CHILLS WITH OR WITHOUT FEVER  NAUSEA AND VOMITING THAT IS NOT CONTROLLED WITH YOUR NAUSEA MEDICATION  *UNUSUAL SHORTNESS OF BREATH  *UNUSUAL BRUISING OR BLEEDING  TENDERNESS IN MOUTH AND THROAT WITH OR WITHOUT PRESENCE OF ULCERS  *URINARY PROBLEMS  *BOWEL PROBLEMS  UNUSUAL RASH Items with * indicate a potential emergency and should be followed up as soon as possible.  Feel free to call the clinic should you have any questions or concerns. The clinic phone number is [phone].  Please show the [facility] at check-in to the Emergency Department and triage nurse.

## 2020-06-20 ENCOUNTER — Telehealth: Payer: Self-pay | Admitting: Adult Health

## 2020-06-20 NOTE — Telephone Encounter (Signed)
No 7/27 los, no changes made to pt schedule

## 2020-06-22 DIAGNOSIS — J3081 Allergic rhinitis due to animal (cat) (dog) hair and dander: Secondary | ICD-10-CM | POA: Diagnosis not present

## 2020-06-22 DIAGNOSIS — J301 Allergic rhinitis due to pollen: Secondary | ICD-10-CM | POA: Diagnosis not present

## 2020-06-22 DIAGNOSIS — J3089 Other allergic rhinitis: Secondary | ICD-10-CM | POA: Diagnosis not present

## 2020-06-25 NOTE — Progress Notes (Signed)
[place]  [phone] [phone]     ID: Theresa Bradley DOB: July 15, 1951  MR#: [id number]  JEH#:[id number]  Patient Care Team: [name], MD as PCP - General [name], [name], MD as Consulting Physician (Allergy and Immunology) [name], OD as Referring Physician [name], RN as Oncology Nurse Navigator [name], RN as Oncology Nurse Navigator [name], MD as Consulting Physician (General Surgery) [name], [name], MD as Consulting Physician (Oncology) [name], MD as Attending Physician (Radiation Oncology) [name], MD as Referring Physician (Dermatology) [name], MD as Consulting Physician (Cardiology) Scot Dock, NP OTHER MD:  CHIEF COMPLAINT: triple positive breast cancer (s/p right mastectomy)  CURRENT TREATMENT: weekly paclitaxel x12; trastuzumab to complete a year   INTERVAL HISTORY: Theresa Bradley" returns today for follow up and treatment of her triple positive breast cancer unaccompanied.   She started on weekly paclitaxel and trastuzumab on 04/24/2020. Today is week 10. She in particular denies any peripheral neuropathy.   Her most recent echocardiogram was completed on 05/30/2020 and showed an EF of 60-65%.      REVIEW OF SYSTEMS: Theresa Bradley is doing quite well today.  She notes that she is experiencing some fatigue and manages this by being active in the mornings and resting in the afternoon.  She notes that in the evenings she does have an earlier bedtime than usual.  She notes she was cooking with her mother and was standing for a prolonged period of time and her feet went numb.  She sat down and regained the feeling and it hasn't happened again since.    She is otherwise doing quite well today.  She has no fever, chills, chest pain, palpitations, cough, shortness of breath, bowel/bladder changes, headaches, decreased appetite, nausea, vomiting or any other  concerns.  A detailed ROS was otherwise non contributory.  HISTORY OF CURRENT ILLNESS: From the original intake note:   Theresa Bradley was my patient a little over 11 years ago when she underwent lumpectomy on 05/15/2008 for ductal carcinoma in situ and received radiation therapy under Dr. Valere Dross. 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  More recently, she presented to her PCP with a small palpable right breast lump around 11 o'clock, just superior and lateral to the nipple. She underwent bilateral diagnostic mammography with tomography and right breast ultrasonography at [facility] on 12/26/2019 showing: breast density category B; 6 mm superficial mass in the right breast at 11 o'clock in the retroareolar region; no enlarged adenopathy in right axilla.  Accordingly on 12/30/2019 she proceeded to biopsy of the right breast area in question. The pathology from this procedure ([id number]) showed: invasive ductal carcinoma, grade 2. Prognostic indicators significant for: estrogen receptor, 95% positive and progesterone receptor, 80% positive, both with strong staining intensity. Proliferation marker Ki67 at 20%. HER2 positive by immunohistochemistry (3+).  The patient's subsequent history is as detailed below.   PAST MEDICAL HISTORY: Past Medical History:  Diagnosis Date  . Anxiety   . Breast cancer ([place]) 2009   right  . Cancer ([place])   . COUGH, CHRONIC 05/10/2010  . Dysrhythmia    occationally "skips a beat"  . Family history of brain cancer   . Family history of breast cancer   . Family history of lung cancer   . Family history  of multiple myeloma   . Family history of skin cancer   . GERD (gastroesophageal reflux disease)   . HYPERTENSION 04/16/2009  . HYPOTHYROIDISM 04/16/2009  . OSTEOARTHRITIS 04/16/2009  . Personal history of radiation therapy   . SENILE LENTIGO 06/10/2010     PAST SURGICAL HISTORY: Past Surgical History:  Procedure Laterality Date  . BREAST EXCISIONAL BIOPSY Right   . BREAST EXCISIONAL BIOPSY Left   . BREAST LUMPECTOMY Right 2009   radiation only  . BREAST LUMPECTOMY WITH RADIOACTIVE SEED AND SENTINEL LYMPH NODE BIOPSY Right 01/31/2020   Procedure: RIGHT BREAST LUMPECTOMY WITH RADIOACTIVE SEED AND SENTINEL LYMPH NODE BIOPSY;  Surgeon: [name], MD;  Location: [facility];  Service: General;  Laterality: Right;  PEC BLOCK  . BREAST SURGERY  2009   X 2, cancer stage 0, lumpectomy  . Bright  . CHOLECYSTECTOMY    . DILATATION & CURETTAGE/HYSTEROSCOPY WITH MYOSURE  11/2015  . HERNIA REPAIR  2009  . JOINT REPLACEMENT  2008   hip  . OPEN SURGICAL REPAIR OF GLUTEAL TENDON Left 07/18/2019   Procedure: Left hip bearing surface revision with gluteal tendon repair;  Surgeon: Gaynelle Arabian, MD;  Location: [facility];  Service: Orthopedics;  Laterality: Left;  2 hrs  . PORTACATH PLACEMENT N/A 03/15/2020   Procedure: INSERTION PORT-A-CATH WITH ULTRASOUND GUIDANCE;  Surgeon: [name], MD;  Location: [facility];  Service: General;  Laterality: N/A;  . SIMPLE MASTECTOMY WITH AXILLARY SENTINEL NODE BIOPSY Right 03/15/2020   Procedure: RIGHT SIMPLE MASTECTOMY;  Surgeon: [name], MD;  Location: [facility];  Service: General;  Laterality: Right;    FAMILY HISTORY: Family History  Problem Relation Age of Onset  . Arthritis Other   . Hypertension Other   . Cancer Other        2 aunts  . Hyperlipidemia Father   . Dementia Father   . Skin cancer Father   . Skin cancer Mother   . Breast cancer Maternal Aunt 63  . Breast cancer Maternal Aunt        dx. mid-70s  . Lung cancer Paternal Uncle   . Multiple myeloma Paternal Uncle   . Cancer Paternal Aunt        unknown type, dx. >50  . Brain cancer Cousin        dx. 28s, paternal cousin   The patient's father is 22  years old and the patient's mother is 28 years old as of February 2021.  The patient has one brother, no sisters.  A maternal aunt was diagnosed with breast cancer at age 80.  A paternal uncle was diagnosed with lung cancer in his 55s and another paternal uncle with a "blood cancer" in his late 62s.  There is no history of ovarian pancreatic or prostate cancer in the family to her knowledge.   GYNECOLOGIC HISTORY:  No LMP recorded. Patient is postmenopausal. Menarche: 69 years old Age at first live birth: 69 years old [place] P 2 LMP HRT no  Hysterectomy?  No BSO?  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  Her husband Patrick Jupiter runs and environmental company that for example checks for mold and then remediate.  Son Delfino Lovett, 28 years old, lives in [place] and works as a Clinical biochemist.  He has 2 sons.  The patient's son Catalina Antigua  54 lives in [place] and works in Chief Executive Officer.  He has no children.  The patient is Episcopalian    ADVANCED DIRECTIVES: In the absence of any documentation to the contrary, the patient's spouse is their HCPOA.    HEALTH MAINTENANCE: Social History   Tobacco Use  . Smoking status: Never Smoker  . Smokeless tobacco: Never Used  . Tobacco comment: father smoked when she was younger   Vaping Use  . Vaping Use: Never used  Substance Use Topics  . Alcohol use: Yes    Comment: occasionally   . Drug use: No     Colonoscopy: 03/2009/[facility]  PAP: 10/2013, negative  Bone density: 11/2015, T score -1.7   Allergies  Allergen Reactions  . Codeine Sulfate Nausea And Vomiting  . Erythromycin Base Other (See Comments)    Stomach ache  . Esomeprazole Magnesium     REACTION: GI upset    Current Outpatient Medications  Medication Sig Dispense Refill  . ALPRAZolam (XANAX) 0.25 MG tablet TAKE 1/2 TABLET BY MOUTH AT BEDTIME AS NEEDED INSOMNIA 45 tablet 0  .  amLODipine (NORVASC) 5 MG tablet TAKE 1 TABLET BY MOUTH EVERY DAY 90 tablet 3  . BIOTIN 5000 PO Take by mouth.    . Calcium Carbonate-Vitamin D 600-200 MG-UNIT TABS Take 1 tablet by mouth daily.    . citalopram (CELEXA) 20 MG tablet Take 1 tablet (20 mg total) by mouth daily. 90 tablet 3  . docusate sodium (COLACE) 100 MG capsule Take 100 mg by mouth daily as needed for mild constipation.     Marland Kitchen EPIPEN 2-PAK 0.3 MG/0.3ML SOAJ injection Inject 0.3 mg into the muscle as needed for anaphylaxis.     . famotidine (PEPCID) 10 MG tablet Take 10 mg by mouth daily.     . fluocinonide gel (LIDEX) 0.05 %     . levothyroxine (SYNTHROID) 50 MCG tablet TAKE 1 TABLET BY MOUTH EVERY DAY 90 tablet 1  . lidocaine-prilocaine (EMLA) cream Apply to affected area once 30 g 3  . magic mouthwash SOLN Take 5 mLs by mouth 4 (four) times daily as needed for mouth pain. 240 mL 1  . meloxicam (MOBIC) 15 MG tablet TAKE 1 TABLET BY MOUTH EVERY DAY 90 tablet 1  . mupirocin ointment (BACTROBAN) 2 % APPLY TO AFFECTED AREA EVERY DAY    . prochlorperazine (COMPAZINE) 10 MG tablet Take 1 tablet (10 mg total) by mouth every 6 (six) hours as needed (Nausea or vomiting). 30 tablet 1  . tretinoin (RETIN-A) 0.025 % cream Apply topically at bedtime. 45 g 6   No current facility-administered medications for this visit.    OBJECTIVE:   Vitals:   06/26/20 0823  BP: 140/74  Pulse: 62  Resp: 20  Temp: 98.2 F (36.8 C)  SpO2: 98%     Body mass index is 25.76 kg/m.   Wt Readings from Last 3 Encounters:  06/26/20 150 lb 1.6 oz (68.1 kg)  06/19/20 149 lb 6.4 oz (67.8 kg)  06/12/20 149 lb 6.4 oz (67.8 kg)  For vitals on 05/29/2020 see the infusion area flowsheet   ECOG FS:1 - Symptomatic but completely ambulatory  GENERAL: Patient is a well appearing female in no acute distress HEENT:  Sclerae anicteric.  Mask in place. Neck is supple.  NODES:  No cervical, supraclavicular, or axillary lymphadenopathy palpated.  BREAST EXAM:   Deferred. LUNGS:  Clear to auscultation bilaterally.  No wheezes or rhonchi. HEART:  Regular rate and rhythm. No murmur  appreciated. ABDOMEN:  Soft, nontender.  Positive, normoactive bowel sounds. No organomegaly palpated. MSK:  No focal spinal tenderness to palpation.  EXTREMITIES:  No peripheral edema.   SKIN:  Clear with no obvious rashes or skin changes. No nail dyscrasia. NEURO:  Nonfocal. Well oriented.  Appropriate affect.     LAB RESULTS:  CMP     Component Value Date/Time   NA 141 06/19/2020 0807   K 3.6 06/19/2020 0807   CL 106 06/19/2020 0807   CO2 28 06/19/2020 0807   GLUCOSE 115 (H) 06/19/2020 0807   BUN 9 06/19/2020 0807   CREATININE 0.79 06/19/2020 0807   CREATININE 0.81 01/11/2020 1209   CALCIUM 9.5 06/19/2020 0807   PROT 6.8 06/19/2020 0807   ALBUMIN 3.6 06/19/2020 0807   AST 15 06/19/2020 0807   AST 14 (L) 01/11/2020 1209   ALT 20 06/19/2020 0807   ALT 13 01/11/2020 1209   ALKPHOS 57 06/19/2020 0807   BILITOT 0.6 06/19/2020 0807   BILITOT 0.5 01/11/2020 1209   GFRNONAA >60 06/19/2020 0807   GFRNONAA >60 01/11/2020 1209   GFRAA >60 06/19/2020 0807   GFRAA >60 01/11/2020 1209    No results found for: TOTALPROTELP, ALBUMINELP, A1GS, A2GS, BETS, BETA2SER, GAMS, MSPIKE, SPEI  Lab Results  Component Value Date   WBC 4.7 06/26/2020   NEUTROABS 2.5 06/26/2020   HGB 10.9 (L) 06/26/2020   HCT 32.4 (L) 06/26/2020   MCV 91.0 06/26/2020   PLT 367 06/26/2020    No results found for: LABCA2  No components found for: [id number]  No results for input(s): INR in the last 168 hours.  No results found for: LABCA2  No results found for: AVW979  No results found for: YIA165  No results found for: VVZ482  No results found for: CA2729  No components found for: HGQUANT  No results found for: CEA1 / No results found for: CEA1   No results found for: AFPTUMOR  No results found for: CHROMOGRNA  No results found for: KPAFRELGTCHN, LAMBDASER,  KAPLAMBRATIO (kappa/lambda light chains)  No results found for: HGBA, HGBA2QUANT, HGBFQUANT, HGBSQUAN (Hemoglobinopathy evaluation)   No results found for: LDH  No results found for: IRON, TIBC, IRONPCTSAT (Iron and TIBC)  No results found for: FERRITIN  Urinalysis    Component Value Date/Time   COLORURINE yellow 05/24/2010 0855   APPEARANCEUR Clear 05/24/2010 0855   LABSPEC 1.015 05/24/2010 0855   PHURINE 6.0 05/24/2010 0855   GLUCOSEU NEGATIVE 09/22/2007 1110   HGBUR negative 05/24/2010 0855   BILIRUBINUR n 06/29/2019 1533   KETONESUR NEGATIVE 09/22/2007 1110   PROTEINUR Negative 06/29/2019 1533   PROTEINUR NEGATIVE 09/22/2007 1110   UROBILINOGEN 0.2 06/29/2019 1533   UROBILINOGEN 0.2 05/24/2010 0855   NITRITE n 06/29/2019 1533   NITRITE negative 05/24/2010 0855   LEUKOCYTESUR Negative 06/29/2019 1533     STUDIES: ECHOCARDIOGRAM COMPLETE  Result Date: 05/30/2020    ECHOCARDIOGRAM REPORT   Patient Name:   BALEY SHANDS Date of Exam: 05/30/2020 Medical Rec #:  [id number]       Height:       64.0 in Accession #:    [id number]      Weight:       149.7 lb Date of Birth:  1951/05/21        BSA:          1.730 m Patient Age:    33 years        BP:  146/70 mmHg Patient Gender: F               HR:           64 bpm. Exam Location:  Outpatient Procedure: 2D Echo and Strain Analysis Indications:    Chemo Z09  History:        Patient has prior history of Echocardiogram examinations, most                 recent 01/17/2020. Breast Cancer; Risk Factors:Hypertension.  Sonographer:    Mikki Santee RDCS (AE) Referring Phys: [place]  1. Left ventricular ejection fraction, by estimation, is 60 to 65%. The left ventricle has normal function. The left ventricle has no regional wall motion abnormalities. Left ventricular diastolic parameters are consistent with Grade I diastolic dysfunction (impaired relaxation). The average left ventricular global longitudinal  strain is -21.8 %. The global longitudinal strain is normal.  2. Right ventricular systolic function is normal. The right ventricular size is normal. There is normal pulmonary artery systolic pressure. The estimated right ventricular systolic pressure is 64.3 mmHg.  3. The mitral valve is degenerative. Trivial mitral valve regurgitation. No evidence of mitral stenosis.  4. The aortic valve is tricuspid. Aortic valve regurgitation is not visualized. Mild aortic valve sclerosis is present, with no evidence of aortic valve stenosis.  5. The inferior vena cava is normal in size with greater than 50% respiratory variability, suggesting right atrial pressure of 3 mmHg. Comparison(s): No significant change from prior study. FINDINGS  Left Ventricle: Left ventricular ejection fraction, by estimation, is 60 to 65%. The left ventricle has normal function. The left ventricle has no regional wall motion abnormalities. The average left ventricular global longitudinal strain is -21.8 %. The global longitudinal strain is normal. The left ventricular internal cavity size was normal in size. There is no left ventricular hypertrophy. Left ventricular diastolic parameters are consistent with Grade I diastolic dysfunction (impaired relaxation). Right Ventricle: The right ventricular size is normal. No increase in right ventricular wall thickness. Right ventricular systolic function is normal. There is normal pulmonary artery systolic pressure. The tricuspid regurgitant velocity is 2.47 m/s, and  with an assumed right atrial pressure of 3 mmHg, the estimated right ventricular systolic pressure is 32.9 mmHg. Left Atrium: Left atrial size was normal in size. Right Atrium: Right atrial size was normal in size. Pericardium: Trivial pericardial effusion is present. Mitral Valve: The mitral valve is degenerative in appearance. Mild mitral annular calcification. Trivial mitral valve regurgitation. No evidence of mitral valve stenosis.  Tricuspid Valve: The tricuspid valve is grossly normal. Tricuspid valve regurgitation is mild . No evidence of tricuspid stenosis. Aortic Valve: The aortic valve is tricuspid. . There is mild thickening and mild calcification of the aortic valve. Aortic valve regurgitation is not visualized. Mild aortic valve sclerosis is present, with no evidence of aortic valve stenosis. There is mild thickening of the aortic valve. There is mild calcification of the aortic valve. Pulmonic Valve: The pulmonic valve was grossly normal. Pulmonic valve regurgitation is trivial. No evidence of pulmonic stenosis. Aorta: The aortic root and ascending aorta are structurally normal, with no evidence of dilitation. Venous: The inferior vena cava is normal in size with greater than 50% respiratory variability, suggesting right atrial pressure of 3 mmHg. IAS/Shunts: The atrial septum is grossly normal.  LEFT VENTRICLE PLAX 2D LVIDd:         4.40 cm  Diastology LVIDs:  2.90 cm  LV e' lateral:   8.27 cm/s LV PW:         1.10 cm  LV E/e' lateral: 12.1 LV IVS:        1.10 cm  LV e' medial:    7.29 cm/s LVOT diam:     2.10 cm  LV E/e' medial:  13.7 LV SV:         86 LV SV Index:   50       2D Longitudinal Strain LVOT Area:     3.46 cm 2D Strain GLS Avg:     -21.8 %  RIGHT VENTRICLE RV S prime:     9.75 cm/s TAPSE (M-mode): 2.5 cm LEFT ATRIUM             Index       RIGHT ATRIUM           Index LA diam:        3.90 cm 2.25 cm/m  RA Area:     11.00 cm LA Vol (A2C):   45.4 ml 26.24 ml/m RA Volume:   20.80 ml  12.02 ml/m LA Vol (A4C):   44.7 ml 25.84 ml/m LA Biplane Vol: 46.7 ml 26.99 ml/m  AORTIC VALVE LVOT Vmax:   102.00 cm/s LVOT Vmean:  65.400 cm/s LVOT VTI:    0.248 m  AORTA Ao Root diam: 3.20 cm MITRAL VALVE                TRICUSPID VALVE MV Area (PHT): 3.65 cm     TR Peak grad:   24.4 mmHg MV Decel Time: 208 msec     TR Vmax:        247.00 cm/s MV E velocity: 100.00 cm/s MV A velocity: 93.50 cm/s   SHUNTS MV E/A ratio:  1.07          Systemic VTI:  0.25 m                             Systemic Diam: 2.10 cm Eleonore Chiquito MD Electronically signed by Eleonore Chiquito MD Signature Date/Time: 05/30/2020/8:30:17 PM    Final      ELIGIBLE FOR AVAILABLE RESEARCH PROTOCOL: no  ASSESSMENT: 69 y.o. [place] woman status post right breast periareolar biopsy 12/30/2019 for a clinical T1a N0, stage I invasive ductal carcinoma, grade 2, estrogen and progesterone receptor positive, HER-2 amplified, with an MIB-one of 20%.  (1) history of prior right lumpectomy June 2009 for ductal carcinoma in situ  (a) status post adjuvant radiation  (b) did not receive antiestrogens  (2) status post repeat right lumpectomy and sentinel lymph node sampling 01/31/2020 for a pT1c pN0, stage IA invasive ductal carcinoma, grade 2, with positive margins  (a) total one sentinel node removed  (3) s/p right mastectomy 03/15/2020 showing residual invasive ductal carcinoma measuring 0.9 cm, but negative margins (added to prior 1.1 lumpectomy, still T1 N0 or stage IA)  (a) one additional axillary node was removed (total 2 right axillary nodes removed)  (3) adjuvant chemotherapy with paclitaxel and trastuzumab weekly x 12 started 04/24/2020 04/17/2020  (4) trastuzumab to be continued every 21 days to total one year  (a) echo 01/17/2020 shows an ejection fraction in the 60-65% range  (b) echo 05/30/2020 shows an ejection fraction of 60-65%.  (5) to start antiestrogens at the completion of local treatment.  (a) Bone density in 11/2015 shows osteopenia with T score of -1.7.  (6)  genetics testing 02/01/2020 through the [facility] Breast Cancer STAT panel found no deleterious mutations then ATM, BRCA1, BRCA2, CDH1, CHEK2, PALB2, PTEN, STK11 and TP53.    PLAN: Brynja continues on adjuvant chemotherapy with Pacltiaxel and Trastuzumab with good tolerance.  She will receive cycle #10 today.  Her labs have been reviewed.  CBC remains stable, CMET is pending.  She will  proceed with treatment so long as her CMET is within parameters.  Theresa Bradley and I discussed her fatigue.  She is doing exactly what is recommended, which is brief exercise, and energy conservation.  This will start to improve slowly once she completes chemotherapy which is in two more weeks.    Theresa Bradley has maintained a good appetite.  The issue of her feet losing feeling is positional. It hasn't happened again since, however we will continue to monitor her closely for peripheral neuropathy.  Evanell understands the above.  We will see her on a weekly basis for labs, f/u, and her treatment until she completes her chemotherapy.  She knows to call for any questions that may arise between now and her next appointment.  We are happy to see her sooner if needed.   Total encounter time 20 minutes.Wilber Bihari, NP 06/26/20 8:58 AM Medical Oncology and Hematology [facility] [place], [place] [address] Tel. [phone]    Fax. [phone]     *Total Encounter Time as defined by the Centers for Medicare and Medicaid Services includes, in addition to the face-to-face time of a patient visit (documented in the note above) non-face-to-face time: obtaining and reviewing outside history, ordering and reviewing medications, tests or procedures, care coordination (communications with other health care professionals or caregivers) and documentation in the medical record.

## 2020-06-26 ENCOUNTER — Encounter: Payer: Self-pay | Admitting: *Deleted

## 2020-06-26 ENCOUNTER — Other Ambulatory Visit: Payer: Self-pay

## 2020-06-26 ENCOUNTER — Inpatient Hospital Stay: Payer: Medicare Other | Attending: Oncology

## 2020-06-26 ENCOUNTER — Inpatient Hospital Stay: Payer: Medicare Other | Admitting: Adult Health

## 2020-06-26 ENCOUNTER — Other Ambulatory Visit: Payer: Medicare Other

## 2020-06-26 ENCOUNTER — Encounter: Payer: Self-pay | Admitting: Adult Health

## 2020-06-26 ENCOUNTER — Inpatient Hospital Stay: Payer: Medicare Other

## 2020-06-26 VITALS — BP 140/74 | HR 62 | Temp 98.2°F | Resp 20 | Ht 64.0 in | Wt 150.1 lb

## 2020-06-26 DIAGNOSIS — C50411 Malignant neoplasm of upper-outer quadrant of right female breast: Secondary | ICD-10-CM

## 2020-06-26 DIAGNOSIS — C50111 Malignant neoplasm of central portion of right female breast: Secondary | ICD-10-CM | POA: Insufficient documentation

## 2020-06-26 DIAGNOSIS — Z5112 Encounter for antineoplastic immunotherapy: Secondary | ICD-10-CM | POA: Diagnosis not present

## 2020-06-26 DIAGNOSIS — Z5111 Encounter for antineoplastic chemotherapy: Secondary | ICD-10-CM | POA: Diagnosis not present

## 2020-06-26 DIAGNOSIS — Z95828 Presence of other vascular implants and grafts: Secondary | ICD-10-CM

## 2020-06-26 DIAGNOSIS — Z170 Estrogen receptor positive status [ER+]: Secondary | ICD-10-CM | POA: Diagnosis not present

## 2020-06-26 LAB — CBC WITH DIFFERENTIAL/PLATELET
Abs Immature Granulocytes: 0.03 10*3/uL (ref 0.00–0.07)
Basophils Absolute: 0.1 10*3/uL (ref 0.0–0.1)
Basophils Relative: 1 %
Eosinophils Absolute: 0.2 10*3/uL (ref 0.0–0.5)
Eosinophils Relative: 4 %
HCT: 32.4 % — ABNORMAL LOW (ref 36.0–46.0)
Hemoglobin: 10.9 g/dL — ABNORMAL LOW (ref 12.0–15.0)
Immature Granulocytes: 1 %
Lymphocytes Relative: 33 %
Lymphs Abs: 1.6 10*3/uL (ref 0.7–4.0)
MCH: 30.6 pg (ref 26.0–34.0)
MCHC: 33.6 g/dL (ref 30.0–36.0)
MCV: 91 fL (ref 80.0–100.0)
Monocytes Absolute: 0.5 10*3/uL (ref 0.1–1.0)
Monocytes Relative: 10 %
Neutro Abs: 2.5 10*3/uL (ref 1.7–7.7)
Neutrophils Relative %: 51 %
Platelets: 367 10*3/uL (ref 150–400)
RBC: 3.56 MIL/uL — ABNORMAL LOW (ref 3.87–5.11)
RDW: 14.6 % (ref 11.5–15.5)
WBC: 4.7 10*3/uL (ref 4.0–10.5)
nRBC: 0 % (ref 0.0–0.2)

## 2020-06-26 LAB — COMPREHENSIVE METABOLIC PANEL
ALT: 17 U/L (ref 0–44)
AST: 17 U/L (ref 15–41)
Albumin: 3.7 g/dL (ref 3.5–5.0)
Alkaline Phosphatase: 64 U/L (ref 38–126)
Anion gap: 7 (ref 5–15)
BUN: 11 mg/dL (ref 8–23)
CO2: 27 mmol/L (ref 22–32)
Calcium: 9.9 mg/dL (ref 8.9–10.3)
Chloride: 106 mmol/L (ref 98–111)
Creatinine, Ser: 0.81 mg/dL (ref 0.44–1.00)
GFR calc Af Amer: >60 mL/min (ref >60)
GFR calc non Af Amer: >60 mL/min (ref >60)
Glucose, Bld: 131 mg/dL — ABNORMAL HIGH (ref 70–99)
Potassium: 3.5 mmol/L (ref 3.5–5.1)
Sodium: 140 mmol/L (ref 135–145)
Total Bilirubin: 0.5 mg/dL (ref 0.3–1.2)
Total Protein: 7 g/dL (ref 6.5–8.1)

## 2020-06-26 MED ORDER — ACETAMINOPHEN 325 MG PO TABS
ORAL_TABLET | ORAL | Status: AC
  Filled 2020-06-26: qty 2

## 2020-06-26 MED ORDER — FAMOTIDINE IN NACL 20-0.9 MG/50ML-% IV SOLN
INTRAVENOUS | Status: AC
  Filled 2020-06-26: qty 50

## 2020-06-26 MED ORDER — TRASTUZUMAB-DKST CHEMO 150 MG IV SOLR
150.0000 mg | Freq: Once | INTRAVENOUS | Status: AC
  Administered 2020-06-26: 150 mg via INTRAVENOUS
  Filled 2020-06-26: qty 7.14

## 2020-06-26 MED ORDER — ACETAMINOPHEN 325 MG PO TABS
650.0000 mg | ORAL_TABLET | Freq: Once | ORAL | Status: AC
  Administered 2020-06-26: 650 mg via ORAL

## 2020-06-26 MED ORDER — DIPHENHYDRAMINE HCL 50 MG/ML IJ SOLN
12.5000 mg | Freq: Once | INTRAMUSCULAR | Status: AC
  Administered 2020-06-26: 12.5 mg via INTRAVENOUS

## 2020-06-26 MED ORDER — FAMOTIDINE IN NACL 20-0.9 MG/50ML-% IV SOLN
20.0000 mg | Freq: Once | INTRAVENOUS | Status: AC
  Administered 2020-06-26: 20 mg via INTRAVENOUS

## 2020-06-26 MED ORDER — DIPHENHYDRAMINE HCL 50 MG/ML IJ SOLN
INTRAMUSCULAR | Status: AC
  Filled 2020-06-26: qty 1

## 2020-06-26 MED ORDER — SODIUM CHLORIDE 0.9 % IV SOLN
Freq: Once | INTRAVENOUS | Status: AC
  Filled 2020-06-26: qty 250

## 2020-06-26 MED ORDER — DEXAMETHASONE SODIUM PHOSPHATE 10 MG/ML IJ SOLN
4.0000 mg | Freq: Once | INTRAMUSCULAR | Status: AC
  Administered 2020-06-26: 4 mg via INTRAVENOUS

## 2020-06-26 MED ORDER — SODIUM CHLORIDE 0.9% FLUSH
10.0000 mL | Freq: Once | INTRAVENOUS | Status: AC
  Administered 2020-06-26: 10 mL
  Filled 2020-06-26: qty 10

## 2020-06-26 MED ORDER — HEPARIN SOD (PORK) LOCK FLUSH 100 UNIT/ML IV SOLN
500.0000 [IU] | Freq: Once | INTRAVENOUS | Status: DC | PRN
  Filled 2020-06-26: qty 5

## 2020-06-26 MED ORDER — SODIUM CHLORIDE 0.9 % IV SOLN
80.0000 mg/m2 | Freq: Once | INTRAVENOUS | Status: AC
  Administered 2020-06-26: 144 mg via INTRAVENOUS
  Filled 2020-06-26: qty 24

## 2020-06-26 MED ORDER — DEXAMETHASONE 4 MG PO TABS
ORAL_TABLET | ORAL | Status: AC
  Filled 2020-06-26: qty 1

## 2020-06-26 MED ORDER — DEXAMETHASONE SODIUM PHOSPHATE 10 MG/ML IJ SOLN
INTRAMUSCULAR | Status: AC
  Filled 2020-06-26: qty 1

## 2020-06-26 MED ORDER — SODIUM CHLORIDE 0.9% FLUSH
10.0000 mL | INTRAVENOUS | Status: DC | PRN
  Filled 2020-06-26: qty 10

## 2020-06-27 ENCOUNTER — Telehealth: Payer: Self-pay | Admitting: Adult Health

## 2020-06-27 NOTE — Telephone Encounter (Signed)
No 8/3 los. No changes made to pt's schedule.

## 2020-07-03 ENCOUNTER — Encounter: Payer: Self-pay | Admitting: Adult Health

## 2020-07-03 ENCOUNTER — Inpatient Hospital Stay: Payer: Medicare Other

## 2020-07-03 ENCOUNTER — Encounter: Payer: Self-pay | Admitting: Oncology

## 2020-07-03 ENCOUNTER — Inpatient Hospital Stay: Payer: Medicare Other | Admitting: Adult Health

## 2020-07-03 ENCOUNTER — Other Ambulatory Visit: Payer: Self-pay

## 2020-07-03 VITALS — BP 138/69 | HR 64 | Temp 97.0°F | Resp 16 | Ht 64.0 in | Wt 148.7 lb

## 2020-07-03 DIAGNOSIS — Z95828 Presence of other vascular implants and grafts: Secondary | ICD-10-CM

## 2020-07-03 DIAGNOSIS — Z5112 Encounter for antineoplastic immunotherapy: Secondary | ICD-10-CM | POA: Diagnosis not present

## 2020-07-03 DIAGNOSIS — C50411 Malignant neoplasm of upper-outer quadrant of right female breast: Secondary | ICD-10-CM

## 2020-07-03 DIAGNOSIS — Z170 Estrogen receptor positive status [ER+]: Secondary | ICD-10-CM

## 2020-07-03 DIAGNOSIS — Z5111 Encounter for antineoplastic chemotherapy: Secondary | ICD-10-CM | POA: Diagnosis not present

## 2020-07-03 DIAGNOSIS — C50111 Malignant neoplasm of central portion of right female breast: Secondary | ICD-10-CM | POA: Diagnosis not present

## 2020-07-03 LAB — CBC WITH DIFFERENTIAL/PLATELET
Abs Immature Granulocytes: 0.03 10*3/uL (ref 0.00–0.07)
Basophils Absolute: 0.1 10*3/uL (ref 0.0–0.1)
Basophils Relative: 1 %
Eosinophils Absolute: 0.2 10*3/uL (ref 0.0–0.5)
Eosinophils Relative: 4 %
HCT: 33.5 % — ABNORMAL LOW (ref 36.0–46.0)
Hemoglobin: 11 g/dL — ABNORMAL LOW (ref 12.0–15.0)
Immature Granulocytes: 1 %
Lymphocytes Relative: 31 %
Lymphs Abs: 1.4 10*3/uL (ref 0.7–4.0)
MCH: 30.1 pg (ref 26.0–34.0)
MCHC: 32.8 g/dL (ref 30.0–36.0)
MCV: 91.8 fL (ref 80.0–100.0)
Monocytes Absolute: 0.4 10*3/uL (ref 0.1–1.0)
Monocytes Relative: 9 %
Neutro Abs: 2.5 10*3/uL (ref 1.7–7.7)
Neutrophils Relative %: 54 %
Platelets: 357 10*3/uL (ref 150–400)
RBC: 3.65 MIL/uL — ABNORMAL LOW (ref 3.87–5.11)
RDW: 14.7 % (ref 11.5–15.5)
WBC: 4.6 10*3/uL (ref 4.0–10.5)
nRBC: 0 % (ref 0.0–0.2)

## 2020-07-03 LAB — COMPREHENSIVE METABOLIC PANEL
ALT: 15 U/L (ref 0–44)
AST: 13 U/L — ABNORMAL LOW (ref 15–41)
Albumin: 3.7 g/dL (ref 3.5–5.0)
Alkaline Phosphatase: 64 U/L (ref 38–126)
Anion gap: 8 (ref 5–15)
BUN: 13 mg/dL (ref 8–23)
CO2: 27 mmol/L (ref 22–32)
Calcium: 9.7 mg/dL (ref 8.9–10.3)
Chloride: 106 mmol/L (ref 98–111)
Creatinine, Ser: 0.75 mg/dL (ref 0.44–1.00)
GFR calc Af Amer: >60 mL/min (ref >60)
GFR calc non Af Amer: >60 mL/min (ref >60)
Glucose, Bld: 96 mg/dL (ref 70–99)
Potassium: 3.6 mmol/L (ref 3.5–5.1)
Sodium: 141 mmol/L (ref 135–145)
Total Bilirubin: 0.6 mg/dL (ref 0.3–1.2)
Total Protein: 7.1 g/dL (ref 6.5–8.1)

## 2020-07-03 MED ORDER — HEPARIN SOD (PORK) LOCK FLUSH 100 UNIT/ML IV SOLN
500.0000 [IU] | Freq: Once | INTRAVENOUS | Status: AC | PRN
  Administered 2020-07-03: 500 [IU]
  Filled 2020-07-03: qty 5

## 2020-07-03 MED ORDER — DIPHENHYDRAMINE HCL 25 MG PO CAPS: 25.0000 mg | ORAL_CAPSULE | Freq: Once | ORAL | Status: DC

## 2020-07-03 MED ORDER — ACETAMINOPHEN 325 MG PO TABS
650.0000 mg | ORAL_TABLET | Freq: Once | ORAL | Status: AC
  Administered 2020-07-03: 650 mg via ORAL

## 2020-07-03 MED ORDER — TRASTUZUMAB-DKST CHEMO 150 MG IV SOLR
399.0000 mg | Freq: Once | INTRAVENOUS | Status: AC
  Administered 2020-07-03: 399 mg via INTRAVENOUS
  Filled 2020-07-03: qty 19

## 2020-07-03 MED ORDER — SODIUM CHLORIDE 0.9% FLUSH
10.0000 mL | Freq: Once | INTRAVENOUS | Status: AC
  Administered 2020-07-03: 10 mL
  Filled 2020-07-03: qty 10

## 2020-07-03 MED ORDER — SODIUM CHLORIDE 0.9 % IV SOLN
Freq: Once | INTRAVENOUS | Status: AC
  Filled 2020-07-03: qty 250

## 2020-07-03 MED ORDER — ACETAMINOPHEN 325 MG PO TABS
ORAL_TABLET | ORAL | Status: AC
  Filled 2020-07-03: qty 2

## 2020-07-03 MED ORDER — SODIUM CHLORIDE 0.9% FLUSH
10.0000 mL | INTRAVENOUS | Status: DC | PRN
  Administered 2020-07-03: 10 mL
  Filled 2020-07-03: qty 10

## 2020-07-03 MED ORDER — DIPHENHYDRAMINE HCL 12.5 MG/5ML PO ELIX
ORAL_SOLUTION | ORAL | Status: AC
  Filled 2020-07-03: qty 5

## 2020-07-03 MED ORDER — DIPHENHYDRAMINE HCL 12.5 MG/5ML PO ELIX
12.5000 mg | ORAL_SOLUTION | Freq: Once | ORAL | Status: AC
  Administered 2020-07-03: 12.5 mg via ORAL

## 2020-07-03 MED ORDER — DIPHENHYDRAMINE HCL 25 MG PO CAPS
ORAL_CAPSULE | ORAL | Status: AC
  Filled 2020-07-03: qty 1

## 2020-07-03 NOTE — Patient Instructions (Signed)
Trastuzumab injection for infusion What is this medicine? TRASTUZUMAB (tras TOO zoo mab) is a monoclonal antibody. It is used to treat breast cancer and stomach cancer. This medicine may be used for other purposes; ask your health care provider or pharmacist if you have questions. COMMON BRAND NAME(S): Herceptin, Herzuma, KANJINTI, Ogivri, Ontruzant, Trazimera What should I tell my health care provider before I take this medicine? They need to know if you have any of these conditions:  heart disease  heart failure  lung or breathing disease, like asthma  an unusual or allergic reaction to trastuzumab, benzyl alcohol, or other medications, foods, dyes, or preservatives  pregnant or trying to get pregnant  breast-feeding How should I use this medicine? This drug is given as an infusion into a vein. It is administered in a hospital or clinic by a specially trained health care professional. Talk to your pediatrician regarding the use of this medicine in children. This medicine is not approved for use in children. Overdosage: If you think you have taken too much of this medicine contact a poison control center or emergency room at once. NOTE: This medicine is only for you. Do not share this medicine with others. What if I miss a dose? It is important not to miss a dose. Call your doctor or health care professional if you are unable to keep an appointment. What may interact with this medicine? This medicine may interact with the following medications:  certain types of chemotherapy, such as daunorubicin, doxorubicin, epirubicin, and idarubicin This list may not describe all possible interactions. Give your health care provider a list of all the medicines, herbs, non-prescription drugs, or dietary supplements you use. Also tell them if you smoke, drink alcohol, or use illegal drugs. Some items may interact with your medicine. What should I watch for while using this medicine? Visit your  doctor for checks on your progress. Report any side effects. Continue your course of treatment even though you feel ill unless your doctor tells you to stop. Call your doctor or health care professional for advice if you get a fever, chills or sore throat, or other symptoms of a cold or flu. Do not treat yourself. Try to avoid being around people who are sick. You may experience fever, chills and shaking during your first infusion. These effects are usually mild and can be treated with other medicines. Report any side effects during the infusion to your health care professional. Fever and chills usually do not happen with later infusions. Do not become pregnant while taking this medicine or for 7 months after stopping it. Women should inform their doctor if they wish to become pregnant or think they might be pregnant. Women of child-bearing potential will need to have a negative pregnancy test before starting this medicine. There is a potential for serious side effects to an unborn child. Talk to your health care professional or pharmacist for more information. Do not breast-feed an infant while taking this medicine or for 7 months after stopping it. Women must use effective birth control with this medicine. What side effects may I notice from receiving this medicine? Side effects that you should report to your doctor or health care professional as soon as possible:  allergic reactions like skin rash, itching or hives, swelling of the face, lips, or tongue  chest pain or palpitations  cough  dizziness  feeling faint or lightheaded, falls  fever  general ill feeling or flu-like symptoms  signs of worsening heart failure like   breathing problems; swelling in your legs and feet  unusually weak or tired Side effects that usually do not require medical attention (report to your doctor or health care professional if they continue or are bothersome):  bone pain  changes in  taste  diarrhea  joint pain  nausea/vomiting  weight loss This list may not describe all possible side effects. Call your doctor for medical advice about side effects. You may report side effects to FDA at 1-800-FDA-1088. Where should I keep my medicine? This drug is given in a hospital or clinic and will not be stored at home. NOTE: This sheet is a summary. It may not cover all possible information. If you have questions about this medicine, talk to your doctor, pharmacist, or health care provider.  2020 Elsevier/Gold Standard (2016-11-04 14:37:52)  

## 2020-07-03 NOTE — Progress Notes (Addendum)
Theresa Bradley  [phone] [phone]     ID: Theresa Bradley DOB: August 02, 1951  MR#: [id number]  LZJ#:[id number]  Patient Care Team: [name], MD as PCP - General [name], [name], MD as Consulting Physician (Allergy and Immunology) [name], OD as Referring Physician [name], RN as Oncology Nurse Navigator [name], RN as Oncology Nurse Navigator [name], MD as Consulting Physician (General Surgery) [name], [name], MD as Consulting Physician (Oncology) [name], MD as Attending Physician (Radiation Oncology) [name], MD as Referring Physician (Dermatology) [name], MD as Consulting Physician (Cardiology) Scot Dock, NP OTHER MD:  CHIEF COMPLAINT: triple positive breast cancer (s/p right mastectomy)  CURRENT TREATMENT: completing weekly paclitaxel; trastuzumab to complete a year   INTERVAL HISTORY: Theresa Bradley" returns today for follow up and treatment of her triple positive breast cancer unaccompanied.   She started on weekly paclitaxel and trastuzumab on 04/24/2020. Today is week 11. She has started to notice a mild neuropathy, more accurately described as a dysesthesia in her fingertips that has been present since receiving her 10th cycle of treatment.  She denies any motor deficits, pain, or issues with her toes/feet.   Her most recent echocardiogram was completed on 05/30/2020 and showed an EF of 60-65%.      REVIEW OF SYSTEMS: Theresa Bradley is doing moderately well today.  In addition to her new neuropathy, she is also very fatigued.  She is napping more frequently, and is looking forward to being finished with chemotherapy so that she can regain some strength.  She denies any fever, chills, chest pain, palpitations, cough, bowel/bladder changes, headaches, vision issues, shortness of breath, or any further concerns.  A detailed ROS was otherwise non contributory.     HISTORY OF CURRENT ILLNESS: From the original intake note:   Theresa Bradley was my patient a little over 11 years ago when she underwent lumpectomy on 05/15/2008 for ductal carcinoma in situ and received radiation therapy under Dr. Valere Dross. 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  More recently, she presented to her PCP with a small palpable right breast lump around 11 o'clock, just superior and lateral to the nipple. She underwent bilateral diagnostic mammography with tomography and right breast ultrasonography at [facility] on 12/26/2019 showing: breast density category B; 6 mm superficial mass in the right breast at 11 o'clock in the retroareolar region; no enlarged adenopathy in right axilla.  Accordingly on 12/30/2019 she proceeded to biopsy of the right breast area in question. The pathology from this procedure ([id number]) showed: invasive ductal carcinoma, grade 2. Prognostic indicators significant for: estrogen receptor, 95% positive and progesterone receptor, 80% positive, both with strong staining intensity. Proliferation marker Ki67 at 20%. HER2 positive by immunohistochemistry (3+).  The patient's subsequent history is as detailed below.   PAST MEDICAL HISTORY: Past Medical History:  Diagnosis Date  . Anxiety   . Breast cancer ([place]) 2009   right  . Cancer ([place])   . COUGH, CHRONIC 05/10/2010  . Dysrhythmia    occationally "skips a beat"  . Family history of brain cancer   . Family history of breast cancer   . Family history of lung cancer   . Family history of multiple myeloma   . Family history of skin cancer   . GERD (gastroesophageal reflux disease)   .  HYPERTENSION 04/16/2009  . HYPOTHYROIDISM 04/16/2009  . OSTEOARTHRITIS 04/16/2009  . Personal history of radiation therapy   . SENILE LENTIGO 06/10/2010    PAST SURGICAL HISTORY: Past Surgical History:   Procedure Laterality Date  . BREAST EXCISIONAL BIOPSY Right   . BREAST EXCISIONAL BIOPSY Left   . BREAST LUMPECTOMY Right 2009   radiation only  . BREAST LUMPECTOMY WITH RADIOACTIVE SEED AND SENTINEL LYMPH NODE BIOPSY Right 01/31/2020   Procedure: RIGHT BREAST LUMPECTOMY WITH RADIOACTIVE SEED AND SENTINEL LYMPH NODE BIOPSY;  Surgeon: [name], MD;  Location: [facility];  Service: General;  Laterality: Right;  PEC BLOCK  . BREAST SURGERY  2009   X 2, cancer stage 0, lumpectomy  . [place]  . CHOLECYSTECTOMY    . DILATATION & CURETTAGE/HYSTEROSCOPY WITH MYOSURE  11/2015  . HERNIA REPAIR  2009  . JOINT REPLACEMENT  2008   hip  . OPEN SURGICAL REPAIR OF GLUTEAL TENDON Left 07/18/2019   Procedure: Left hip bearing surface revision with gluteal tendon repair;  Surgeon: Gaynelle Arabian, MD;  Location: [facility];  Service: Orthopedics;  Laterality: Left;  2 hrs  . PORTACATH PLACEMENT N/A 03/15/2020   Procedure: INSERTION PORT-A-CATH WITH ULTRASOUND GUIDANCE;  Surgeon: [name], MD;  Location: [facility];  Service: General;  Laterality: N/A;  . SIMPLE MASTECTOMY WITH AXILLARY SENTINEL NODE BIOPSY Right 03/15/2020   Procedure: RIGHT SIMPLE MASTECTOMY;  Surgeon: [name], MD;  Location: [facility];  Service: General;  Laterality: Right;    FAMILY HISTORY: Family History  Problem Relation Age of Onset  . Arthritis Other   . Hypertension Other   . Cancer Other        2 aunts  . Hyperlipidemia Father   . Dementia Father   . Skin cancer Father   . Skin cancer Mother   . Breast cancer Maternal Aunt 63  . Breast cancer Maternal Aunt        dx. mid-70s  . Lung cancer Paternal Uncle   . Multiple myeloma Paternal Uncle   . Cancer Paternal Aunt        unknown type, dx. >50  . Brain cancer Cousin        dx. 25s, paternal cousin   The patient's father is 75 years old and the patient's mother is 82 years old as of  February 2021.  The patient has one brother, no sisters.  A maternal aunt was diagnosed with breast cancer at age 24.  A paternal uncle was diagnosed with lung cancer in his 30s and another paternal uncle with a "blood cancer" in his late 11s.  There is no history of ovarian pancreatic or prostate cancer in the family to her knowledge.   GYNECOLOGIC HISTORY:  No LMP recorded. Patient is postmenopausal. Menarche: 69 years old Age at first live birth: 69 years old [place] P 2 LMP HRT no  Hysterectomy?  No BSO?  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  Her husband Patrick Jupiter runs and environmental company that for example checks for mold and then remediate.  Son Delfino Lovett, 62 years old, lives in [place] and works as a Clinical biochemist.  He has 2 sons.  The patient's son Catalina Antigua 32 lives in [place] and works in Chief Executive Officer.  He has no children.  The patient is Episcopalian  ADVANCED DIRECTIVES: In the absence of any documentation to the contrary, the patient's spouse is their HCPOA.    HEALTH MAINTENANCE: Social History   Tobacco Use  . Smoking status: Never Smoker  . Smokeless tobacco: Never Used  . Tobacco comment: father smoked when she was younger   Vaping Use  . Vaping Use: Never used  Substance Use Topics  . Alcohol use: Yes    Comment: occasionally   . Drug use: No     Colonoscopy: 03/2009/[facility]  PAP: 10/2013, negative  Bone density: 11/2015, T score -1.7   Allergies  Allergen Reactions  . Codeine Sulfate Nausea And Vomiting  . Erythromycin Base Other (See Comments)    Stomach ache  . Esomeprazole Magnesium     REACTION: GI upset    Current Outpatient Medications  Medication Sig Dispense Refill  . ALPRAZolam (XANAX) 0.25 MG tablet TAKE 1/2 TABLET BY MOUTH AT BEDTIME AS NEEDED INSOMNIA 45 tablet 0  . amLODipine (NORVASC) 5 MG tablet TAKE 1 TABLET BY MOUTH  EVERY DAY 90 tablet 3  . BIOTIN 5000 PO Take by mouth.    . Calcium Carbonate-Vitamin D 600-200 MG-UNIT TABS Take 1 tablet by mouth daily.    . citalopram (CELEXA) 20 MG tablet Take 1 tablet (20 mg total) by mouth daily. 90 tablet 3  . docusate sodium (COLACE) 100 MG capsule Take 100 mg by mouth daily as needed for mild constipation.     Marland Kitchen EPIPEN 2-PAK 0.3 MG/0.3ML SOAJ injection Inject 0.3 mg into the muscle as needed for anaphylaxis.     . famotidine (PEPCID) 10 MG tablet Take 10 mg by mouth daily.     . fluocinonide gel (LIDEX) 0.05 %     . levothyroxine (SYNTHROID) 50 MCG tablet TAKE 1 TABLET BY MOUTH EVERY DAY 90 tablet 1  . lidocaine-prilocaine (EMLA) cream Apply to affected area once 30 g 3  . magic mouthwash SOLN Take 5 mLs by mouth 4 (four) times daily as needed for mouth pain. 240 mL 1  . meloxicam (MOBIC) 15 MG tablet TAKE 1 TABLET BY MOUTH EVERY DAY 90 tablet 1  . mupirocin ointment (BACTROBAN) 2 % APPLY TO AFFECTED AREA EVERY DAY    . prochlorperazine (COMPAZINE) 10 MG tablet Take 1 tablet (10 mg total) by mouth every 6 (six) hours as needed (Nausea or vomiting). 30 tablet 1  . tretinoin (RETIN-A) 0.025 % cream Apply topically at bedtime. 45 g 6   No current facility-administered medications for this visit.    OBJECTIVE:   Vitals:   07/03/20 0914  BP: 138/69  Pulse: 64  Resp: 16  Temp: (!) 97 F (36.1 C)  SpO2: 98%     Body mass index is 25.52 kg/m.   Wt Readings from Last 3 Encounters:  07/03/20 148 lb 11.2 oz (67.4 kg)  06/26/20 150 lb 1.6 oz (68.1 kg)  06/19/20 149 lb 6.4 oz (67.8 kg)  For vitals on 05/29/2020 see the infusion area flowsheet   ECOG FS:1 - Symptomatic but completely ambulatory  GENERAL: Patient is a well appearing female in no acute distress HEENT:  Sclerae anicteric.  Mask in place. Neck is supple.  NODES:  No cervical, supraclavicular, or axillary lymphadenopathy palpated.  BREAST EXAM:  Deferred. LUNGS:  Clear to auscultation bilaterally.   No wheezes or rhonchi. HEART:  Regular rate and rhythm. No murmur appreciated. ABDOMEN:  Soft, nontender.  Positive, normoactive bowel sounds. No organomegaly palpated. MSK:  No focal spinal tenderness to  palpation.  EXTREMITIES:  No peripheral edema.   SKIN:  Clear with no obvious rashes or skin changes. No nail dyscrasia. NEURO:  Nonfocal. Well oriented.  Appropriate affect.     LAB RESULTS:  CMP     Component Value Date/Time   NA 140 06/26/2020 0810   K 3.5 06/26/2020 0810   CL 106 06/26/2020 0810   CO2 27 06/26/2020 0810   GLUCOSE 131 (H) 06/26/2020 0810   BUN 11 06/26/2020 0810   CREATININE 0.81 06/26/2020 0810   CREATININE 0.81 01/11/2020 1209   CALCIUM 9.9 06/26/2020 0810   PROT 7.0 06/26/2020 0810   ALBUMIN 3.7 06/26/2020 0810   AST 17 06/26/2020 0810   AST 14 (L) 01/11/2020 1209   ALT 17 06/26/2020 0810   ALT 13 01/11/2020 1209   ALKPHOS 64 06/26/2020 0810   BILITOT 0.5 06/26/2020 0810   BILITOT 0.5 01/11/2020 1209   GFRNONAA >60 06/26/2020 0810   GFRNONAA >60 01/11/2020 1209   GFRAA >60 06/26/2020 0810   GFRAA >60 01/11/2020 1209    No results found for: TOTALPROTELP, ALBUMINELP, A1GS, A2GS, BETS, BETA2SER, GAMS, MSPIKE, SPEI  Lab Results  Component Value Date   WBC 4.6 07/03/2020   NEUTROABS 2.5 07/03/2020   HGB 11.0 (L) 07/03/2020   HCT 33.5 (L) 07/03/2020   MCV 91.8 07/03/2020   PLT 357 07/03/2020    No results found for: LABCA2  No components found for: VCBSWH675  No results for input(s): INR in the last 168 hours.  No results found for: LABCA2  No results found for: FFM384  No results found for: YKZ993  No results found for: TTS177  No results found for: CA2729  No components found for: HGQUANT  No results found for: CEA1 / No results found for: CEA1   No results found for: AFPTUMOR  No results found for: CHROMOGRNA  No results found for: KPAFRELGTCHN, LAMBDASER, KAPLAMBRATIO (kappa/lambda light chains)  No results found  for: HGBA, HGBA2QUANT, HGBFQUANT, HGBSQUAN (Hemoglobinopathy evaluation)   No results found for: LDH  No results found for: IRON, TIBC, IRONPCTSAT (Iron and TIBC)  No results found for: FERRITIN  Urinalysis    Component Value Date/Time   COLORURINE yellow 05/24/2010 0855   APPEARANCEUR Clear 05/24/2010 0855   LABSPEC 1.015 05/24/2010 0855   PHURINE 6.0 05/24/2010 0855   GLUCOSEU NEGATIVE 09/22/2007 1110   HGBUR negative 05/24/2010 0855   BILIRUBINUR n 06/29/2019 1533   KETONESUR NEGATIVE 09/22/2007 1110   PROTEINUR Negative 06/29/2019 [place] 09/22/2007 1110   UROBILINOGEN 0.2 06/29/2019 1533   UROBILINOGEN 0.2 05/24/2010 0855   NITRITE n 06/29/2019 1533   NITRITE negative 05/24/2010 0855   LEUKOCYTESUR Negative 06/29/2019 1533     STUDIES: No results found.   ELIGIBLE FOR AVAILABLE RESEARCH PROTOCOL: no  ASSESSMENT: 69 y.o. [place] woman status post right breast periareolar biopsy 12/30/2019 for a clinical T1a N0, stage I invasive ductal carcinoma, grade 2, estrogen and progesterone receptor positive, HER-2 amplified, with an MIB-one of 20%.  (1) history of prior right lumpectomy June 2009 for ductal carcinoma in situ  (a) status post adjuvant radiation  (b) did not receive antiestrogens  (2) status post repeat right lumpectomy and sentinel lymph node sampling 01/31/2020 for a pT1c pN0, stage IA invasive ductal carcinoma, grade 2, with positive margins  (a) total one sentinel node removed  (3) s/p right mastectomy 03/15/2020 showing residual invasive ductal carcinoma measuring 0.9 cm, but negative margins (added to prior 1.1 lumpectomy,  still T1 N0 or stage IA)  (a) one additional axillary node was removed (total 2 right axillary nodes removed)  (3) adjuvant chemotherapy with paclitaxel and trastuzumab weekly x 12 started 04/24/2020 04/17/2020  (4) trastuzumab to be continued every 21 days to total one year  (a) echo 01/17/2020 shows an  ejection fraction in the 60-65% range  (b) echo 05/30/2020 shows an ejection fraction of 60-65%.  (5) to start antiestrogens at the completion of local treatment.  (a) Bone density in 11/2015 shows osteopenia with T score of -1.7.  (6) genetics testing 02/01/2020 through the [facility] Breast [facility] STAT panel found no deleterious mutations then ATM, BRCA1, BRCA2, CDH1, CHEK2, PALB2, PTEN, STK11 and TP53.    PLAN: Samaiyah met with myself and Dr. Jana Hakim today.  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  Due to this, she will stop receiving the weekly Paclitaxel and will transition to Trastuzumab given every 3 weeks.  Dr. Jana Hakim explained this to her in detail.  We discussed her activilty level and how taking short walks and slowly increasing will help with her fatigue and stamina.    We will see Courtland back in 3 weeks for labs, f/u to discuss anti estrogen therapy, and her next trastuzumab.  She knows to call for any questions that may arise between now and her next appointment.  We are happy to see her sooner if needed.   Total encounter time 20 minutes.Wilber Bihari, NP 07/03/20 9:17 AM Medical Oncology and Hematology [facility], [place], [place] [address] Tel. [phone]    Fax. [phone]   ADDENDUM: Chavy is finally developing some neuropathy.  We are stopping the taxane.  Of course we are continuing the trastuzumab as that it is not related to this particular symptom.  I reassured Theresa Bradley that most patients not able to complete 12 doses of Taxol because of this problem and also let her know that if we did continue and try to get her the final treatment she might end up with significant neuropathy which might be disabling.  She understands and is agreeing to stop today.  Her risk reduction of course is going to be due primarily to the trastuzumab and to antiestrogens.  Right now she is a little bit beat up from the  treatment and I would like her to recover over about a month before returning to see me.  At that time we will discuss tamoxifen versus anastrozole in great detail and she will able to choose 1 to start with.  We will review her prognosis again at that time, w so she understands she has an excellent chance of cure  She knows to call for any other issue that may develop before the next visit.   I personally saw this patient and performed a substantive portion of this encounter with the listed APP documented above.   Chauncey Cruel, MD Medical Oncology and Hematology [facility] [address] [place], [place] [address] Tel. [phone]    Fax. [phone]      *Total Encounter Time as defined by the Centers for Medicare and Medicaid Services includes, in addition to the face-to-face time of a patient visit (documented in the note above) non-face-to-face time: obtaining and reviewing outside history, ordering and reviewing medications, tests or procedures, care coordination (communications with other health care professionals or caregivers) and documentation in the medical record.

## 2020-07-03 NOTE — Progress Notes (Signed)
07/03/20  Decrease dose of diphenhydramine to 12.5 mg orally x 1 prior to trastuzumab.  Orders updated to reflect above.  T.O. Wilber Bihari, NP/Zanita Millman Ronnald Ramp, PharmD

## 2020-07-06 ENCOUNTER — Other Ambulatory Visit: Payer: Self-pay | Admitting: Family Medicine

## 2020-07-06 DIAGNOSIS — Z85820 Personal history of malignant melanoma of skin: Secondary | ICD-10-CM | POA: Diagnosis not present

## 2020-07-06 DIAGNOSIS — D225 Melanocytic nevi of trunk: Secondary | ICD-10-CM | POA: Diagnosis not present

## 2020-07-06 DIAGNOSIS — J3081 Allergic rhinitis due to animal (cat) (dog) hair and dander: Secondary | ICD-10-CM | POA: Diagnosis not present

## 2020-07-06 DIAGNOSIS — J301 Allergic rhinitis due to pollen: Secondary | ICD-10-CM | POA: Diagnosis not present

## 2020-07-06 DIAGNOSIS — L853 Xerosis cutis: Secondary | ICD-10-CM | POA: Diagnosis not present

## 2020-07-06 DIAGNOSIS — D1801 Hemangioma of skin and subcutaneous tissue: Secondary | ICD-10-CM | POA: Diagnosis not present

## 2020-07-06 DIAGNOSIS — B351 Tinea unguium: Secondary | ICD-10-CM | POA: Diagnosis not present

## 2020-07-06 DIAGNOSIS — J3089 Other allergic rhinitis: Secondary | ICD-10-CM | POA: Diagnosis not present

## 2020-07-09 DIAGNOSIS — C50911 Malignant neoplasm of unspecified site of right female breast: Secondary | ICD-10-CM | POA: Diagnosis not present

## 2020-07-09 NOTE — Telephone Encounter (Signed)
Okay for refill?   Lov 11/2019  Last refil L 02/2020 45 tab no refills

## 2020-07-11 ENCOUNTER — Other Ambulatory Visit: Payer: Medicare Other

## 2020-07-11 ENCOUNTER — Encounter: Payer: Self-pay | Admitting: *Deleted

## 2020-07-11 ENCOUNTER — Ambulatory Visit: Payer: Medicare Other

## 2020-07-11 ENCOUNTER — Ambulatory Visit: Payer: Medicare Other | Admitting: Adult Health

## 2020-07-18 DIAGNOSIS — C50911 Malignant neoplasm of unspecified site of right female breast: Secondary | ICD-10-CM | POA: Diagnosis not present

## 2020-07-18 NOTE — Progress Notes (Signed)
The following biosimilar Ogivri (trastuzumab-dkst) has been selected for use in this patient. Theresa Bradley is auth'd.  Kennith Center, Pharm.D., CPP 07/18/2020@1 :28 PM

## 2020-07-20 DIAGNOSIS — J3081 Allergic rhinitis due to animal (cat) (dog) hair and dander: Secondary | ICD-10-CM | POA: Diagnosis not present

## 2020-07-20 DIAGNOSIS — J3089 Other allergic rhinitis: Secondary | ICD-10-CM | POA: Diagnosis not present

## 2020-07-20 DIAGNOSIS — J301 Allergic rhinitis due to pollen: Secondary | ICD-10-CM | POA: Diagnosis not present

## 2020-07-23 ENCOUNTER — Encounter: Payer: Self-pay | Admitting: *Deleted

## 2020-07-24 ENCOUNTER — Inpatient Hospital Stay: Payer: Medicare Other

## 2020-07-24 ENCOUNTER — Other Ambulatory Visit: Payer: Self-pay

## 2020-07-24 ENCOUNTER — Inpatient Hospital Stay (HOSPITAL_BASED_OUTPATIENT_CLINIC_OR_DEPARTMENT_OTHER): Payer: Medicare Other | Admitting: Oncology

## 2020-07-24 VITALS — BP 139/56 | HR 54 | Temp 97.9°F | Resp 18 | Ht 64.0 in | Wt 149.6 lb

## 2020-07-24 DIAGNOSIS — C50411 Malignant neoplasm of upper-outer quadrant of right female breast: Secondary | ICD-10-CM | POA: Diagnosis not present

## 2020-07-24 DIAGNOSIS — Z170 Estrogen receptor positive status [ER+]: Secondary | ICD-10-CM | POA: Diagnosis not present

## 2020-07-24 DIAGNOSIS — C50111 Malignant neoplasm of central portion of right female breast: Secondary | ICD-10-CM | POA: Diagnosis not present

## 2020-07-24 DIAGNOSIS — Z95828 Presence of other vascular implants and grafts: Secondary | ICD-10-CM

## 2020-07-24 DIAGNOSIS — Z5111 Encounter for antineoplastic chemotherapy: Secondary | ICD-10-CM | POA: Diagnosis not present

## 2020-07-24 DIAGNOSIS — Z5112 Encounter for antineoplastic immunotherapy: Secondary | ICD-10-CM | POA: Diagnosis not present

## 2020-07-24 LAB — CBC WITH DIFFERENTIAL/PLATELET
Abs Immature Granulocytes: 0.01 10*3/uL (ref 0.00–0.07)
Basophils Absolute: 0.1 10*3/uL (ref 0.0–0.1)
Basophils Relative: 1 %
Eosinophils Absolute: 0.3 10*3/uL (ref 0.0–0.5)
Eosinophils Relative: 5 %
HCT: 37.5 % (ref 36.0–46.0)
Hemoglobin: 12.2 g/dL (ref 12.0–15.0)
Immature Granulocytes: 0 %
Lymphocytes Relative: 28 %
Lymphs Abs: 1.6 10*3/uL (ref 0.7–4.0)
MCH: 30.3 pg (ref 26.0–34.0)
MCHC: 32.5 g/dL (ref 30.0–36.0)
MCV: 93.3 fL (ref 80.0–100.0)
Monocytes Absolute: 0.6 10*3/uL (ref 0.1–1.0)
Monocytes Relative: 10 %
Neutro Abs: 3.2 10*3/uL (ref 1.7–7.7)
Neutrophils Relative %: 56 %
Platelets: 323 10*3/uL (ref 150–400)
RBC: 4.02 MIL/uL (ref 3.87–5.11)
RDW: 13.4 % (ref 11.5–15.5)
WBC: 5.7 10*3/uL (ref 4.0–10.5)
nRBC: 0 % (ref 0.0–0.2)

## 2020-07-24 LAB — COMPREHENSIVE METABOLIC PANEL
ALT: 13 U/L (ref 0–44)
AST: 15 U/L (ref 15–41)
Albumin: 3.7 g/dL (ref 3.5–5.0)
Alkaline Phosphatase: 70 U/L (ref 38–126)
Anion gap: 7 (ref 5–15)
BUN: 13 mg/dL (ref 8–23)
CO2: 29 mmol/L (ref 22–32)
Calcium: 9.7 mg/dL (ref 8.9–10.3)
Chloride: 105 mmol/L (ref 98–111)
Creatinine, Ser: 0.81 mg/dL (ref 0.44–1.00)
GFR calc Af Amer: >60 mL/min (ref >60)
GFR calc non Af Amer: >60 mL/min (ref >60)
Glucose, Bld: 121 mg/dL — ABNORMAL HIGH (ref 70–99)
Potassium: 3.4 mmol/L — ABNORMAL LOW (ref 3.5–5.1)
Sodium: 141 mmol/L (ref 135–145)
Total Bilirubin: 0.7 mg/dL (ref 0.3–1.2)
Total Protein: 7.1 g/dL (ref 6.5–8.1)

## 2020-07-24 MED ORDER — SODIUM CHLORIDE 0.9% FLUSH
10.0000 mL | Freq: Once | INTRAVENOUS | Status: AC
  Administered 2020-07-24: 10 mL
  Filled 2020-07-24: qty 10

## 2020-07-24 MED ORDER — DIPHENHYDRAMINE HCL 12.5 MG/5ML PO ELIX
12.5000 mg | ORAL_SOLUTION | Freq: Once | ORAL | Status: AC
  Administered 2020-07-24: 12.5 mg via ORAL

## 2020-07-24 MED ORDER — HEPARIN SOD (PORK) LOCK FLUSH 100 UNIT/ML IV SOLN
500.0000 [IU] | Freq: Once | INTRAVENOUS | Status: AC | PRN
  Administered 2020-07-24: 500 [IU]
  Filled 2020-07-24: qty 5

## 2020-07-24 MED ORDER — SODIUM CHLORIDE 0.9% FLUSH
10.0000 mL | INTRAVENOUS | Status: DC | PRN
  Administered 2020-07-24: 10 mL
  Filled 2020-07-24: qty 10

## 2020-07-24 MED ORDER — TAMOXIFEN CITRATE 20 MG PO TABS: 20.0000 mg | ORAL_TABLET | Freq: Every day | ORAL | 4 refills | Status: AC

## 2020-07-24 MED ORDER — ACETAMINOPHEN 325 MG PO TABS
650.0000 mg | ORAL_TABLET | Freq: Once | ORAL | Status: AC
  Administered 2020-07-24: 650 mg via ORAL

## 2020-07-24 MED ORDER — SODIUM CHLORIDE 0.9 % IV SOLN
Freq: Once | INTRAVENOUS | Status: AC
  Filled 2020-07-24: qty 250

## 2020-07-24 MED ORDER — ACETAMINOPHEN 325 MG PO TABS
ORAL_TABLET | ORAL | Status: AC
  Filled 2020-07-24: qty 2

## 2020-07-24 MED ORDER — TRASTUZUMAB-DKST CHEMO 150 MG IV SOLR
6.0000 mg/kg | Freq: Once | INTRAVENOUS | Status: AC
  Administered 2020-07-24: 399 mg via INTRAVENOUS
  Filled 2020-07-24: qty 19

## 2020-07-24 MED ORDER — DIPHENHYDRAMINE HCL 12.5 MG/5ML PO ELIX
ORAL_SOLUTION | ORAL | Status: AC
  Filled 2020-07-24: qty 5

## 2020-07-24 NOTE — Progress Notes (Signed)
Theresa Bradley  [phone] [phone]     ID: Theresa Bradley DOB: 1951-03-14  MR#: [id number]  JEH#:[id number]  Patient Care Team: [name], MD as PCP - General [name], [name], MD as Consulting Physician (Allergy and Immunology) [name], OD as Referring Physician [name], RN as Oncology Nurse Navigator [name], RN as Oncology Nurse Navigator [name], MD as Consulting Physician (General Surgery) Zavier Canela, [name], MD as Consulting Physician (Oncology) [name], MD as Attending Physician (Radiation Oncology) [name], MD as Referring Physician (Dermatology) [name], MD as Consulting Physician (Cardiology) Chauncey Cruel, MD OTHER MD:  CHIEF COMPLAINT: triple positive breast cancer (s/p right mastectomy)  CURRENT TREATMENT: completing weekly paclitaxel; trastuzumab to complete a year   INTERVAL HISTORY: Theresa Bradley returns today for follow up and treatment of her triple positive breast cancer.  She completed 10 cycles of paclitaxel with trastuzumab on 06/26/2020.  At that point the paclitaxel was discontinued because of early peripheral neuropathy symptoms.  She now continues on trastuzumab alone.  Her most recent echocardiogram was completed on 05/30/2020 and showed an EF of 60-65%.      REVIEW OF SYSTEMS: Theresa Bradley neuropathy involves only the fingers, not the toes, and it was minimal.  It is now intermittent, and most of the time she has no numbness or tingling at all.  In particular at bedtime at night she does not have any symptoms.  She does still feel some mental fatigue.  She is trying to walk for exercise.  She remains very concerned about the possibility of recurrence.  She is avoiding crowds and folks who have not been vaccinated and unfortunately this means she will not be able to go to the shower for her daughter-in-law before the upcoming wedding.  Aside from these issues  a detailed review of systems today was stable    HISTORY OF CURRENT ILLNESS: From the original intake note:   Theresa Bradley was my patient a little over 11 years ago when she underwent lumpectomy on 05/15/2008 for ductal carcinoma in situ and received radiation therapy under Dr. Valere Dross. 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  More recently, she presented to her PCP with a small palpable right breast lump around 11 o'clock, just superior and lateral to the nipple. She underwent bilateral diagnostic mammography with tomography and right breast ultrasonography at [facility] on 12/26/2019 showing: breast density category B; 6 mm superficial mass in the right breast at 11 o'clock in the retroareolar region; no enlarged adenopathy in right axilla.  Accordingly on 12/30/2019 she proceeded to biopsy of the right breast area in question. The pathology from this procedure ([id number]) showed: invasive ductal carcinoma, grade 2. Prognostic indicators significant for: estrogen receptor, 95% positive and progesterone receptor, 80% positive, both with strong staining intensity. Proliferation marker Ki67 at 20%. HER2 positive by immunohistochemistry (3+).  The patient's subsequent history is as detailed below.   PAST MEDICAL HISTORY: Past Medical History:  Diagnosis Date  . Anxiety   . Breast cancer ([place]) 2009   right  . Cancer ([place])   . COUGH, CHRONIC 05/10/2010  . Dysrhythmia    occationally "skips a beat"  . Family history of brain cancer   . Family history of breast cancer   . Family history of lung cancer   .  Family history of multiple myeloma   . Family history of skin cancer   . GERD (gastroesophageal reflux disease)   . HYPERTENSION 04/16/2009  . HYPOTHYROIDISM 04/16/2009  . OSTEOARTHRITIS 04/16/2009  . Personal history of radiation therapy   . SENILE LENTIGO 06/10/2010    PAST  SURGICAL HISTORY: Past Surgical History:  Procedure Laterality Date  . BREAST EXCISIONAL BIOPSY Right   . BREAST EXCISIONAL BIOPSY Left   . BREAST LUMPECTOMY Right 2009   radiation only  . BREAST LUMPECTOMY WITH RADIOACTIVE SEED AND SENTINEL LYMPH NODE BIOPSY Right 01/31/2020   Procedure: RIGHT BREAST LUMPECTOMY WITH RADIOACTIVE SEED AND SENTINEL LYMPH NODE BIOPSY;  Surgeon: [name], MD;  Location: [facility];  Service: General;  Laterality: Right;  PEC BLOCK  . BREAST SURGERY  2009   X 2, cancer stage 0, lumpectomy  . [place]  . CHOLECYSTECTOMY    . DILATATION & CURETTAGE/HYSTEROSCOPY WITH MYOSURE  11/2015  . HERNIA REPAIR  2009  . JOINT REPLACEMENT  2008   hip  . OPEN SURGICAL REPAIR OF GLUTEAL TENDON Left 07/18/2019   Procedure: Left hip bearing surface revision with gluteal tendon repair;  Surgeon: Gaynelle Arabian, MD;  Location: [facility];  Service: Orthopedics;  Laterality: Left;  2 hrs  . PORTACATH PLACEMENT N/A 03/15/2020   Procedure: INSERTION PORT-A-CATH WITH ULTRASOUND GUIDANCE;  Surgeon: [name], MD;  Location: [facility];  Service: General;  Laterality: N/A;  . SIMPLE MASTECTOMY WITH AXILLARY SENTINEL NODE BIOPSY Right 03/15/2020   Procedure: RIGHT SIMPLE MASTECTOMY;  Surgeon: [name], MD;  Location: [facility];  Service: General;  Laterality: Right;    FAMILY HISTORY: Family History  Problem Relation Age of Onset  . Arthritis Other   . Hypertension Other   . Cancer Other        2 aunts  . Hyperlipidemia Father   . Dementia Father   . Skin cancer Father   . Skin cancer Mother   . Breast cancer Maternal Aunt 63  . Breast cancer Maternal Aunt        dx. mid-70s  . Lung cancer Paternal Uncle   . Multiple myeloma Paternal Uncle   . Cancer Paternal Aunt        unknown type, dx. >50  . Brain cancer Cousin        dx. 52s, paternal cousin   The patient's father is 52 years old and  the patient's mother is 35 years old as of February 2021.  The patient has one brother, no sisters.  A maternal aunt was diagnosed with breast cancer at age 46.  A paternal uncle was diagnosed with lung cancer in his 23s and another paternal uncle with a "blood cancer" in his late 34s.  There is no history of ovarian pancreatic or prostate cancer in the family to her knowledge.   GYNECOLOGIC HISTORY:  No LMP recorded. Patient is postmenopausal. Menarche: 69 years old Age at first live birth: 69 years old [place] P 2 LMP HRT no  Hysterectomy?  No BSO?  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  Her husband Patrick Jupiter runs and environmental company that for example checks for mold and then remediates.  Son Delfino Lovett, 23 years old, lives in [place] and works as a Clinical biochemist.  He has 2 sons.  The patient's  son Catalina Antigua 84 lives in [place] and works in Chief Executive Officer.  He has no children.  The patient is Episcopalian    ADVANCED DIRECTIVES: In the absence of any documentation to the contrary, the patient's spouse is their HCPOA.    HEALTH MAINTENANCE: Social History   Tobacco Use  . Smoking status: Never Smoker  . Smokeless tobacco: Never Used  . Tobacco comment: father smoked when she was younger   Vaping Use  . Vaping Use: Never used  Substance Use Topics  . Alcohol use: Yes    Comment: occasionally   . Drug use: No     Colonoscopy: 03/2009/[facility]  PAP: 10/2013, negative  Bone density: 11/2015, T score -1.7   Allergies  Allergen Reactions  . Codeine Sulfate Nausea And Vomiting  . Erythromycin Base Other (See Comments)    Stomach ache  . Esomeprazole Magnesium     REACTION: GI upset    Current Outpatient Medications  Medication Sig Dispense Refill  . ALPRAZolam (XANAX) 0.25 MG tablet TAKE 1/2 TABLET BY MOUTH AT BEDTIME AS NEEDED INSOMNIA 45 tablet 0  . amLODipine  (NORVASC) 5 MG tablet TAKE 1 TABLET BY MOUTH EVERY DAY 90 tablet 3  . BIOTIN 5000 PO Take by mouth.    . Calcium Carbonate-Vitamin D 600-200 MG-UNIT TABS Take 1 tablet by mouth daily.    . citalopram (CELEXA) 20 MG tablet Take 1 tablet (20 mg total) by mouth daily. 90 tablet 3  . docusate sodium (COLACE) 100 MG capsule Take 100 mg by mouth daily as needed for mild constipation.     Marland Kitchen EPIPEN 2-PAK 0.3 MG/0.3ML SOAJ injection Inject 0.3 mg into the muscle as needed for anaphylaxis.     . famotidine (PEPCID) 10 MG tablet Take 10 mg by mouth daily.     . fluocinonide gel (LIDEX) 0.05 %     . levothyroxine (SYNTHROID) 50 MCG tablet TAKE 1 TABLET BY MOUTH EVERY DAY 90 tablet 1  . magic mouthwash SOLN Take 5 mLs by mouth 4 (four) times daily as needed for mouth pain. 240 mL 1  . meloxicam (MOBIC) 15 MG tablet TAKE 1 TABLET BY MOUTH EVERY DAY 90 tablet 1  . mupirocin ointment (BACTROBAN) 2 % APPLY TO AFFECTED AREA EVERY DAY    . tamoxifen (NOLVADEX) 20 MG tablet Take 1 tablet (20 mg total) by mouth daily. Start 07/31/2020 90 tablet 4  . tretinoin (RETIN-A) 0.025 % cream Apply topically at bedtime. 45 g 6   No current facility-administered medications for this visit.   Facility-Administered Medications Ordered in Other Visits  Medication Dose Route Frequency Provider Last Rate Last Admin  . sodium chloride flush (NS) 0.9 % injection 10 mL  10 mL Intracatheter PRN Rucker Pridgeon, [name], MD   10 mL at 07/24/20 1504    OBJECTIVE: White woman in no acute distress  Vitals:   07/24/20 1238  BP: (!) 139/56  Pulse: (!) 54  Resp: 18  Temp: 97.9 F (36.6 C)  SpO2: 99%     Body mass index is 25.68 kg/m.   Wt Readings from Last 3 Encounters:  07/24/20 149 lb 9.6 oz (67.9 kg)  07/03/20 148 lb 11.2 oz (67.4 kg)  06/26/20 150 lb 1.6 oz (68.1 kg)  For vitals on 05/29/2020 see the infusion area flowsheet   ECOG FS:1 - Symptomatic but completely ambulatory  Sclerae unicteric, EOMs intact Wearing a  mask No cervical or supraclavicular adenopathy Lungs no rales or rhonchi Heart regular rate and  rhythm Abd soft, nontender, positive bowel sounds MSK no focal spinal tenderness, no upper extremity lymphedema Neuro: nonfocal, well oriented, appropriate affect Breasts: The right breast is status post mastectomy.  There is no evidence of chest wall recurrence.  The left breast is benign.  Both axillae are benign.  LAB RESULTS:  CMP     Component Value Date/Time   NA 141 07/24/2020 1210   K 3.4 (L) 07/24/2020 1210   CL 105 07/24/2020 1210   CO2 29 07/24/2020 1210   GLUCOSE 121 (H) 07/24/2020 1210   BUN 13 07/24/2020 1210   CREATININE 0.81 07/24/2020 1210   CREATININE 0.81 01/11/2020 1209   CALCIUM 9.7 07/24/2020 1210   PROT 7.1 07/24/2020 1210   ALBUMIN 3.7 07/24/2020 1210   AST 15 07/24/2020 1210   AST 14 (L) 01/11/2020 1209   ALT 13 07/24/2020 1210   ALT 13 01/11/2020 1209   ALKPHOS 70 07/24/2020 1210   BILITOT 0.7 07/24/2020 1210   BILITOT 0.5 01/11/2020 1209   GFRNONAA >60 07/24/2020 1210   GFRNONAA >60 01/11/2020 1209   GFRAA >60 07/24/2020 1210   GFRAA >60 01/11/2020 1209    No results found for: TOTALPROTELP, ALBUMINELP, A1GS, A2GS, BETS, BETA2SER, GAMS, MSPIKE, SPEI  Lab Results  Component Value Date   WBC 5.7 07/24/2020   NEUTROABS 3.2 07/24/2020   HGB 12.2 07/24/2020   HCT 37.5 07/24/2020   MCV 93.3 07/24/2020   PLT 323 07/24/2020    No results found for: LABCA2  No components found for: XVPTNQ914  No results for input(s): INR in the last 168 hours.  No results found for: LABCA2  No results found for: OAZ468  No results found for: RKN892  No results found for: ODY641  No results found for: CA2729  No components found for: HGQUANT  No results found for: CEA1 / No results found for: CEA1   No results found for: AFPTUMOR  No results found for: CHROMOGRNA  No results found for: KPAFRELGTCHN, LAMBDASER, KAPLAMBRATIO (kappa/lambda light  chains)  No results found for: HGBA, HGBA2QUANT, HGBFQUANT, HGBSQUAN (Hemoglobinopathy evaluation)   No results found for: LDH  No results found for: IRON, TIBC, IRONPCTSAT (Iron and TIBC)  No results found for: FERRITIN  Urinalysis    Component Value Date/Time   COLORURINE yellow 05/24/2010 0855   APPEARANCEUR Clear 05/24/2010 0855   LABSPEC 1.015 05/24/2010 0855   PHURINE 6.0 05/24/2010 0855   GLUCOSEU NEGATIVE 09/22/2007 1110   HGBUR negative 05/24/2010 0855   BILIRUBINUR n 06/29/2019 1533   KETONESUR NEGATIVE 09/22/2007 1110   PROTEINUR Negative 06/29/2019 1533   PROTEINUR NEGATIVE 09/22/2007 1110   UROBILINOGEN 0.2 06/29/2019 1533   UROBILINOGEN 0.2 05/24/2010 0855   NITRITE n 06/29/2019 1533   NITRITE negative 05/24/2010 0855   LEUKOCYTESUR Negative 06/29/2019 1533     STUDIES: No results found.   ELIGIBLE FOR AVAILABLE RESEARCH PROTOCOL: no  ASSESSMENT: 69 y.o. [place] woman status post right breast periareolar biopsy 12/30/2019 for a clinical T1a N0, stage I invasive ductal carcinoma, grade 2, estrogen and progesterone receptor positive, HER-2 amplified, with an MIB-one of 20%.  (1) history of prior right lumpectomy June 2009 for ductal carcinoma in situ  (a) status post adjuvant radiation  (b) did not receive antiestrogens  (2) status post repeat right lumpectomy and sentinel lymph node sampling 01/31/2020 for a pT1c pN0, stage IA invasive ductal carcinoma, grade 2, with positive margins  (a) total one sentinel node removed  (3) s/p right mastectomy 03/15/2020  showing residual invasive ductal carcinoma measuring 0.9 cm, but negative margins (added to prior 1.1 lumpectomy, still T1 N0 or stage IA)  (a) one additional axillary node was removed (total 2 right axillary nodes removed)  (3) adjuvant chemotherapy with paclitaxel and trastuzumab weekly x 12 started 04/24/2020, completing 10 doses 06/26/2020   (a) final 2 doses omitted secondary to peripheral  neuropathy  (4) trastuzumab to be continued every 21 days to total one year (through May 2022).  (a) echo 01/17/2020 shows an ejection fraction in the 60-65% range  (b) echo 05/30/2020 shows an ejection fraction of 60-65%.  (5) to start tamoxifen 07/31/2020  (a) Bone density in 11/2015 shows osteopenia with T score of -1.7.  (6) genetics testing 02/01/2020 through the [facility] Breast [facility] STAT panel found no deleterious mutations then ATM, BRCA1, BRCA2, CDH1, CHEK2, PALB2, PTEN, STK11 and TP53.    PLAN: Theresa Bradley did very well with her chemotherapy, tolerating 10 doses of paclitaxel before developing mild neuropathy.  Fortunately the neuropathy is already vanishing and she should have fairly normal sensation in her hands, and certainly normal daily activity.  She is now ready for antiestrogens.  Today we discussed difference between tamoxifen and anastrozole in detail. She understands that anastrozole and the aromatase inhibitors in general work by blocking estrogen production. Accordingly vaginal dryness, decrease in bone density, and of course hot flashes can result. The aromatase inhibitors can also negatively affect the cholesterol profile, although that is a minor effect. One out of 5 women on aromatase inhibitors we will feel "old and achy". This arthralgia/myalgia syndrome, which resembles fibromyalgia clinically, does resolve with stopping the medications. Accordingly this is not a reason to not try an aromatase inhibitor but it is a frequent reason to stop it (in other words 20% of women will not be able to tolerate these medications).  Tamoxifen on the other hand does not block estrogen production. It does not "take away a woman's estrogen". It blocks the estrogen receptor in breast cells. Like anastrozole, it can also cause hot flashes. As opposed to anastrozole, tamoxifen has many estrogen-like effects. It is technically an estrogen receptor modulator. This means that in some tissues  tamoxifen works like estrogen-- for example it helps strengthen the bones. It tends to improve the cholesterol profile. It can cause thickening of the endometrial lining, and even endometrial polyps or rarely cancer of the uterus.(The risk of uterine cancer due to tamoxifen is one additional cancer per thousand women year). It can cause vaginal wetness or stickiness. It can cause blood clots through this estrogen-like effect--the risk of blood clots with tamoxifen is exactly the same as with birth control pills or hormone replacement.  Neither of these agents causes mood changes or weight gain, despite the popular belief that they can have these side effects. We have data from studies comparing either of these drugs with placebo, and in those cases the control group had the same amount of weight gain and depression as the group that took the drug.  After much discussion and chiefly because a friend of hers on anastrozole had quite a few symptoms on the drug she would like to give tamoxifen a try.  I have placed the prescription in for her but suggested she not start until after the Labor Day holiday.  We are of course continuing the trastuzumab every 21 days until May of next year.  She will need an echo sometime in October.  She will see me in November with one of  her trastuzumab doses.  I reassured her that I expect her to have an excellent prognosis and I have a very good chance of cure  Total encounter time 35 minutes.Sarajane Jews C. Keyuana Wank, MD 07/24/20 6:10 PM Medical Oncology and Hematology [facility] [address], [place] [address] Tel. [phone]    Fax. [phone]   I, Wilburn Mylar, am acting as scribe for Dr. [name]. Ervey Fallin.  I, Lurline Del MD, have reviewed the above documentation for accuracy and completeness, and I agree with the above.    *Total Encounter Time as defined by the Centers for Medicare and Medicaid Services includes,  in addition to the face-to-face time of a patient visit (documented in the note above) non-face-to-face time: obtaining and reviewing outside history, ordering and reviewing medications, tests or procedures, care coordination (communications with other health care professionals or caregivers) and documentation in the medical record.

## 2020-07-24 NOTE — Patient Instructions (Signed)
[facility] Discharge Instructions for Patients Receiving Chemotherapy  Today you received the following chemotherapy agents: trastuzumab   If you develop nausea and vomiting that is not controlled by your nausea medication, call the clinic.   BELOW ARE SYMPTOMS THAT SHOULD BE REPORTED IMMEDIATELY:  *FEVER GREATER THAN 100.5 F  *CHILLS WITH OR WITHOUT FEVER  NAUSEA AND VOMITING THAT IS NOT CONTROLLED WITH YOUR NAUSEA MEDICATION  *UNUSUAL SHORTNESS OF BREATH  *UNUSUAL BRUISING OR BLEEDING  TENDERNESS IN MOUTH AND THROAT WITH OR WITHOUT PRESENCE OF ULCERS  *URINARY PROBLEMS  *BOWEL PROBLEMS  UNUSUAL RASH Items with * indicate a potential emergency and should be followed up as soon as possible.  Feel free to call the clinic should you have any questions or concerns. The clinic phone number is [phone].  Please show the [facility] at check-in to the Emergency Department and triage nurse.

## 2020-07-24 NOTE — Patient Instructions (Signed)

## 2020-07-25 ENCOUNTER — Telehealth: Payer: Self-pay | Admitting: Oncology

## 2020-07-25 NOTE — Telephone Encounter (Signed)
Scheduled appts per 8/31 los. Pt confirmed appt dates and times.

## 2020-08-01 ENCOUNTER — Encounter: Payer: Self-pay | Admitting: *Deleted

## 2020-08-02 DIAGNOSIS — Z96642 Presence of left artificial hip joint: Secondary | ICD-10-CM | POA: Diagnosis not present

## 2020-08-03 DIAGNOSIS — J3089 Other allergic rhinitis: Secondary | ICD-10-CM | POA: Diagnosis not present

## 2020-08-03 DIAGNOSIS — J3081 Allergic rhinitis due to animal (cat) (dog) hair and dander: Secondary | ICD-10-CM | POA: Diagnosis not present

## 2020-08-03 DIAGNOSIS — J301 Allergic rhinitis due to pollen: Secondary | ICD-10-CM | POA: Diagnosis not present

## 2020-08-09 ENCOUNTER — Encounter: Payer: Self-pay | Admitting: Family Medicine

## 2020-08-14 ENCOUNTER — Inpatient Hospital Stay: Payer: Medicare Other

## 2020-08-14 ENCOUNTER — Other Ambulatory Visit: Payer: Self-pay

## 2020-08-14 ENCOUNTER — Inpatient Hospital Stay: Payer: Medicare Other | Attending: Oncology

## 2020-08-14 VITALS — BP 155/67 | HR 50 | Temp 98.0°F | Resp 18 | Ht 64.0 in | Wt 151.5 lb

## 2020-08-14 DIAGNOSIS — Z5112 Encounter for antineoplastic immunotherapy: Secondary | ICD-10-CM | POA: Insufficient documentation

## 2020-08-14 DIAGNOSIS — M8580 Other specified disorders of bone density and structure, unspecified site: Secondary | ICD-10-CM | POA: Insufficient documentation

## 2020-08-14 DIAGNOSIS — C50111 Malignant neoplasm of central portion of right female breast: Secondary | ICD-10-CM | POA: Insufficient documentation

## 2020-08-14 DIAGNOSIS — C50411 Malignant neoplasm of upper-outer quadrant of right female breast: Secondary | ICD-10-CM

## 2020-08-14 DIAGNOSIS — Z170 Estrogen receptor positive status [ER+]: Secondary | ICD-10-CM

## 2020-08-14 DIAGNOSIS — Z9221 Personal history of antineoplastic chemotherapy: Secondary | ICD-10-CM | POA: Insufficient documentation

## 2020-08-14 DIAGNOSIS — Z86000 Personal history of in-situ neoplasm of breast: Secondary | ICD-10-CM | POA: Diagnosis not present

## 2020-08-14 DIAGNOSIS — Z79810 Long term (current) use of selective estrogen receptor modulators (SERMs): Secondary | ICD-10-CM | POA: Insufficient documentation

## 2020-08-14 DIAGNOSIS — Z923 Personal history of irradiation: Secondary | ICD-10-CM | POA: Insufficient documentation

## 2020-08-14 DIAGNOSIS — Z9011 Acquired absence of right breast and nipple: Secondary | ICD-10-CM | POA: Insufficient documentation

## 2020-08-14 DIAGNOSIS — Z95828 Presence of other vascular implants and grafts: Secondary | ICD-10-CM

## 2020-08-14 LAB — CBC WITH DIFFERENTIAL/PLATELET
Abs Immature Granulocytes: 0.01 10*3/uL (ref 0.00–0.07)
Basophils Absolute: 0 10*3/uL (ref 0.0–0.1)
Basophils Relative: 1 %
Eosinophils Absolute: 0.3 10*3/uL (ref 0.0–0.5)
Eosinophils Relative: 6 %
HCT: 36.3 % (ref 36.0–46.0)
Hemoglobin: 11.9 g/dL — ABNORMAL LOW (ref 12.0–15.0)
Immature Granulocytes: 0 %
Lymphocytes Relative: 32 %
Lymphs Abs: 1.4 10*3/uL (ref 0.7–4.0)
MCH: 30.4 pg (ref 26.0–34.0)
MCHC: 32.8 g/dL (ref 30.0–36.0)
MCV: 92.6 fL (ref 80.0–100.0)
Monocytes Absolute: 0.4 10*3/uL (ref 0.1–1.0)
Monocytes Relative: 8 %
Neutro Abs: 2.4 10*3/uL (ref 1.7–7.7)
Neutrophils Relative %: 53 %
Platelets: 300 10*3/uL (ref 150–400)
RBC: 3.92 MIL/uL (ref 3.87–5.11)
RDW: 12.2 % (ref 11.5–15.5)
WBC: 4.5 10*3/uL (ref 4.0–10.5)
nRBC: 0 % (ref 0.0–0.2)

## 2020-08-14 LAB — COMPREHENSIVE METABOLIC PANEL
ALT: 13 U/L (ref 0–44)
AST: 14 U/L — ABNORMAL LOW (ref 15–41)
Albumin: 3.5 g/dL (ref 3.5–5.0)
Alkaline Phosphatase: 59 U/L (ref 38–126)
Anion gap: 6 (ref 5–15)
BUN: 14 mg/dL (ref 8–23)
CO2: 29 mmol/L (ref 22–32)
Calcium: 9.3 mg/dL (ref 8.9–10.3)
Chloride: 107 mmol/L (ref 98–111)
Creatinine, Ser: 0.82 mg/dL (ref 0.44–1.00)
GFR calc Af Amer: >60 mL/min (ref >60)
GFR calc non Af Amer: >60 mL/min (ref >60)
Glucose, Bld: 137 mg/dL — ABNORMAL HIGH (ref 70–99)
Potassium: 3.6 mmol/L (ref 3.5–5.1)
Sodium: 142 mmol/L (ref 135–145)
Total Bilirubin: 0.5 mg/dL (ref 0.3–1.2)
Total Protein: 6.9 g/dL (ref 6.5–8.1)

## 2020-08-14 MED ORDER — SODIUM CHLORIDE 0.9 % IV SOLN
Freq: Once | INTRAVENOUS | Status: AC
  Filled 2020-08-14: qty 250

## 2020-08-14 MED ORDER — ACETAMINOPHEN 325 MG PO TABS
650.0000 mg | ORAL_TABLET | Freq: Once | ORAL | Status: AC
  Administered 2020-08-14: 650 mg via ORAL

## 2020-08-14 MED ORDER — DIPHENHYDRAMINE HCL 12.5 MG/5ML PO ELIX
12.5000 mg | ORAL_SOLUTION | Freq: Once | ORAL | Status: AC
  Administered 2020-08-14: 12.5 mg via ORAL

## 2020-08-14 MED ORDER — DIPHENHYDRAMINE HCL 12.5 MG/5ML PO ELIX
ORAL_SOLUTION | ORAL | Status: AC
  Filled 2020-08-14: qty 5

## 2020-08-14 MED ORDER — SODIUM CHLORIDE 0.9% FLUSH
10.0000 mL | Freq: Once | INTRAVENOUS | Status: AC
  Administered 2020-08-14: 10 mL
  Filled 2020-08-14: qty 10

## 2020-08-14 MED ORDER — TRASTUZUMAB-DKST CHEMO 150 MG IV SOLR
6.0000 mg/kg | Freq: Once | INTRAVENOUS | Status: AC
  Administered 2020-08-14: 399 mg via INTRAVENOUS
  Filled 2020-08-14: qty 19

## 2020-08-14 MED ORDER — HEPARIN SOD (PORK) LOCK FLUSH 100 UNIT/ML IV SOLN
500.0000 [IU] | Freq: Once | INTRAVENOUS | Status: AC | PRN
  Administered 2020-08-14: 500 [IU]
  Filled 2020-08-14: qty 5

## 2020-08-14 MED ORDER — ACETAMINOPHEN 325 MG PO TABS
ORAL_TABLET | ORAL | Status: AC
  Filled 2020-08-14: qty 2

## 2020-08-14 MED ORDER — SODIUM CHLORIDE 0.9% FLUSH
10.0000 mL | INTRAVENOUS | Status: DC | PRN
  Administered 2020-08-14: 10 mL
  Filled 2020-08-14: qty 10

## 2020-08-14 NOTE — Patient Instructions (Addendum)
[facility] Discharge Instructions for Patients Receiving Chemotherapy  Today you received the following chemotherapy agents Trastuzumab-dkst (OGIVRI).  To help prevent nausea and vomiting after your treatment, we encourage you to take your nausea medication as directed.   If you develop nausea and vomiting that is not controlled by your nausea medication, call the clinic.   BELOW ARE SYMPTOMS THAT SHOULD BE REPORTED IMMEDIATELY:  *FEVER GREATER THAN 100.5 F  *CHILLS WITH OR WITHOUT FEVER  NAUSEA AND VOMITING THAT IS NOT CONTROLLED WITH YOUR NAUSEA MEDICATION  *UNUSUAL SHORTNESS OF BREATH  *UNUSUAL BRUISING OR BLEEDING  TENDERNESS IN MOUTH AND THROAT WITH OR WITHOUT PRESENCE OF ULCERS  *URINARY PROBLEMS  *BOWEL PROBLEMS  UNUSUAL RASH Items with * indicate a potential emergency and should be followed up as soon as possible.  Feel free to call the clinic should you have any questions or concerns. The clinic phone number is [phone].  Please show the [facility] at check-in to the Emergency Department and triage nurse.

## 2020-08-14 NOTE — Patient Instructions (Signed)

## 2020-08-15 DIAGNOSIS — C50911 Malignant neoplasm of unspecified site of right female breast: Secondary | ICD-10-CM | POA: Diagnosis not present

## 2020-08-16 DIAGNOSIS — J301 Allergic rhinitis due to pollen: Secondary | ICD-10-CM | POA: Diagnosis not present

## 2020-08-16 DIAGNOSIS — J3089 Other allergic rhinitis: Secondary | ICD-10-CM | POA: Diagnosis not present

## 2020-08-16 DIAGNOSIS — J3081 Allergic rhinitis due to animal (cat) (dog) hair and dander: Secondary | ICD-10-CM | POA: Diagnosis not present

## 2020-08-17 ENCOUNTER — Other Ambulatory Visit: Payer: Self-pay | Admitting: Family Medicine

## 2020-08-28 ENCOUNTER — Ambulatory Visit (HOSPITAL_COMMUNITY)
Admission: RE | Admit: 2020-08-28 | Discharge: 2020-08-28 | Disposition: A | Discharge status: Home / Self Care | Payer: Medicare Other | Source: Ambulatory Visit | Attending: Oncology | Admitting: Oncology

## 2020-08-28 ENCOUNTER — Other Ambulatory Visit: Payer: Self-pay

## 2020-08-28 DIAGNOSIS — Z0189 Encounter for other specified special examinations: Secondary | ICD-10-CM | POA: Diagnosis not present

## 2020-08-28 DIAGNOSIS — Z170 Estrogen receptor positive status [ER+]: Secondary | ICD-10-CM | POA: Diagnosis not present

## 2020-08-28 DIAGNOSIS — C50411 Malignant neoplasm of upper-outer quadrant of right female breast: Secondary | ICD-10-CM

## 2020-08-28 DIAGNOSIS — Z01818 Encounter for other preprocedural examination: Secondary | ICD-10-CM | POA: Insufficient documentation

## 2020-08-28 DIAGNOSIS — I10 Essential (primary) hypertension: Secondary | ICD-10-CM | POA: Insufficient documentation

## 2020-08-28 LAB — ECHOCARDIOGRAM COMPLETE
Area-P 1/2: 2.73 cm2
S' Lateral: 2.7 cm

## 2020-08-28 NOTE — Progress Notes (Signed)
°  Echocardiogram 2D Echocardiogram has been performed.  Theresa Bradley 08/28/2020, 11:40 AM

## 2020-08-31 DIAGNOSIS — J3089 Other allergic rhinitis: Secondary | ICD-10-CM | POA: Diagnosis not present

## 2020-08-31 DIAGNOSIS — J301 Allergic rhinitis due to pollen: Secondary | ICD-10-CM | POA: Diagnosis not present

## 2020-08-31 DIAGNOSIS — J3081 Allergic rhinitis due to animal (cat) (dog) hair and dander: Secondary | ICD-10-CM | POA: Diagnosis not present

## 2020-09-04 ENCOUNTER — Inpatient Hospital Stay: Payer: Medicare Other

## 2020-09-04 ENCOUNTER — Other Ambulatory Visit: Payer: Self-pay

## 2020-09-04 ENCOUNTER — Inpatient Hospital Stay: Payer: Medicare Other | Attending: Oncology

## 2020-09-04 VITALS — BP 143/62 | HR 54 | Temp 97.7°F | Resp 16

## 2020-09-04 DIAGNOSIS — C50411 Malignant neoplasm of upper-outer quadrant of right female breast: Secondary | ICD-10-CM

## 2020-09-04 DIAGNOSIS — Z170 Estrogen receptor positive status [ER+]: Secondary | ICD-10-CM

## 2020-09-04 DIAGNOSIS — C50111 Malignant neoplasm of central portion of right female breast: Secondary | ICD-10-CM | POA: Insufficient documentation

## 2020-09-04 DIAGNOSIS — Z5112 Encounter for antineoplastic immunotherapy: Secondary | ICD-10-CM | POA: Insufficient documentation

## 2020-09-04 LAB — CBC WITH DIFFERENTIAL/PLATELET
Abs Immature Granulocytes: 0.01 10*3/uL (ref 0.00–0.07)
Basophils Absolute: 0 10*3/uL (ref 0.0–0.1)
Basophils Relative: 1 %
Eosinophils Absolute: 0.2 10*3/uL (ref 0.0–0.5)
Eosinophils Relative: 4 %
HCT: 38.9 % (ref 36.0–46.0)
Hemoglobin: 12.8 g/dL (ref 12.0–15.0)
Immature Granulocytes: 0 %
Lymphocytes Relative: 35 %
Lymphs Abs: 1.5 10*3/uL (ref 0.7–4.0)
MCH: 30 pg (ref 26.0–34.0)
MCHC: 32.9 g/dL (ref 30.0–36.0)
MCV: 91.1 fL (ref 80.0–100.0)
Monocytes Absolute: 0.3 10*3/uL (ref 0.1–1.0)
Monocytes Relative: 8 %
Neutro Abs: 2.2 10*3/uL (ref 1.7–7.7)
Neutrophils Relative %: 52 %
Platelets: 260 10*3/uL (ref 150–400)
RBC: 4.27 MIL/uL (ref 3.87–5.11)
RDW: 11.7 % (ref 11.5–15.5)
WBC: 4.2 10*3/uL (ref 4.0–10.5)
nRBC: 0 % (ref 0.0–0.2)

## 2020-09-04 LAB — COMPREHENSIVE METABOLIC PANEL
ALT: 12 U/L (ref 0–44)
AST: 14 U/L — ABNORMAL LOW (ref 15–41)
Albumin: 3.6 g/dL (ref 3.5–5.0)
Alkaline Phosphatase: 55 U/L (ref 38–126)
Anion gap: 5 (ref 5–15)
BUN: 15 mg/dL (ref 8–23)
CO2: 29 mmol/L (ref 22–32)
Calcium: 9.3 mg/dL (ref 8.9–10.3)
Chloride: 105 mmol/L (ref 98–111)
Creatinine, Ser: 0.87 mg/dL (ref 0.44–1.00)
GFR, Estimated: >60 mL/min (ref >60)
Glucose, Bld: 140 mg/dL — ABNORMAL HIGH (ref 70–99)
Potassium: 3.7 mmol/L (ref 3.5–5.1)
Sodium: 139 mmol/L (ref 135–145)
Total Bilirubin: 0.4 mg/dL (ref 0.3–1.2)
Total Protein: 7.3 g/dL (ref 6.5–8.1)

## 2020-09-04 MED ORDER — SODIUM CHLORIDE 0.9% FLUSH
10.0000 mL | INTRAVENOUS | Status: DC | PRN
  Administered 2020-09-04: 10 mL
  Filled 2020-09-04: qty 10

## 2020-09-04 MED ORDER — TRASTUZUMAB-DKST CHEMO 150 MG IV SOLR
6.0000 mg/kg | Freq: Once | INTRAVENOUS | Status: AC
  Administered 2020-09-04: 399 mg via INTRAVENOUS
  Filled 2020-09-04: qty 19

## 2020-09-04 MED ORDER — HEPARIN SOD (PORK) LOCK FLUSH 100 UNIT/ML IV SOLN
500.0000 [IU] | Freq: Once | INTRAVENOUS | Status: AC | PRN
  Administered 2020-09-04: 500 [IU]
  Filled 2020-09-04: qty 5

## 2020-09-04 MED ORDER — ACETAMINOPHEN 325 MG PO TABS
ORAL_TABLET | ORAL | Status: AC
  Filled 2020-09-04: qty 2

## 2020-09-04 MED ORDER — DIPHENHYDRAMINE HCL 12.5 MG/5ML PO ELIX
12.5000 mg | ORAL_SOLUTION | Freq: Once | ORAL | Status: AC
  Administered 2020-09-04: 12.5 mg via ORAL

## 2020-09-04 MED ORDER — SODIUM CHLORIDE 0.9 % IV SOLN
Freq: Once | INTRAVENOUS | Status: AC
  Filled 2020-09-04: qty 250

## 2020-09-04 MED ORDER — ACETAMINOPHEN 325 MG PO TABS
650.0000 mg | ORAL_TABLET | Freq: Once | ORAL | Status: AC
  Administered 2020-09-04: 650 mg via ORAL

## 2020-09-04 MED ORDER — DIPHENHYDRAMINE HCL 12.5 MG/5ML PO ELIX
ORAL_SOLUTION | ORAL | Status: AC
  Filled 2020-09-04: qty 5

## 2020-09-04 NOTE — Patient Instructions (Signed)

## 2020-09-04 NOTE — Patient Instructions (Signed)
Trastuzumab injection for infusion What is this medicine? TRASTUZUMAB (tras TOO zoo mab) is a monoclonal antibody. It is used to treat breast cancer and stomach cancer. This medicine may be used for other purposes; ask your health care provider or pharmacist if you have questions. COMMON BRAND NAME(S): Herceptin, Herzuma, KANJINTI, Ogivri, Ontruzant, Trazimera What should I tell my health care provider before I take this medicine? They need to know if you have any of these conditions:  heart disease  heart failure  lung or breathing disease, like asthma  an unusual or allergic reaction to trastuzumab, benzyl alcohol, or other medications, foods, dyes, or preservatives  pregnant or trying to get pregnant  breast-feeding How should I use this medicine? This drug is given as an infusion into a vein. It is administered in a hospital or clinic by a specially trained health care professional. Talk to your pediatrician regarding the use of this medicine in children. This medicine is not approved for use in children. Overdosage: If you think you have taken too much of this medicine contact a poison control center or emergency room at once. NOTE: This medicine is only for you. Do not share this medicine with others. What if I miss a dose? It is important not to miss a dose. Call your doctor or health care professional if you are unable to keep an appointment. What may interact with this medicine? This medicine may interact with the following medications:  certain types of chemotherapy, such as daunorubicin, doxorubicin, epirubicin, and idarubicin This list may not describe all possible interactions. Give your health care provider a list of all the medicines, herbs, non-prescription drugs, or dietary supplements you use. Also tell them if you smoke, drink alcohol, or use illegal drugs. Some items may interact with your medicine. What should I watch for while using this medicine? Visit your  doctor for checks on your progress. Report any side effects. Continue your course of treatment even though you feel ill unless your doctor tells you to stop. Call your doctor or health care professional for advice if you get a fever, chills or sore throat, or other symptoms of a cold or flu. Do not treat yourself. Try to avoid being around people who are sick. You may experience fever, chills and shaking during your first infusion. These effects are usually mild and can be treated with other medicines. Report any side effects during the infusion to your health care professional. Fever and chills usually do not happen with later infusions. Do not become pregnant while taking this medicine or for 7 months after stopping it. Women should inform their doctor if they wish to become pregnant or think they might be pregnant. Women of child-bearing potential will need to have a negative pregnancy test before starting this medicine. There is a potential for serious side effects to an unborn child. Talk to your health care professional or pharmacist for more information. Do not breast-feed an infant while taking this medicine or for 7 months after stopping it. Women must use effective birth control with this medicine. What side effects may I notice from receiving this medicine? Side effects that you should report to your doctor or health care professional as soon as possible:  allergic reactions like skin rash, itching or hives, swelling of the face, lips, or tongue  chest pain or palpitations  cough  dizziness  feeling faint or lightheaded, falls  fever  general ill feeling or flu-like symptoms  signs of worsening heart failure like   breathing problems; swelling in your legs and feet  unusually weak or tired Side effects that usually do not require medical attention (report to your doctor or health care professional if they continue or are bothersome):  bone pain  changes in  taste  diarrhea  joint pain  nausea/vomiting  weight loss This list may not describe all possible side effects. Call your doctor for medical advice about side effects. You may report side effects to FDA at 1-800-FDA-1088. Where should I keep my medicine? This drug is given in a hospital or clinic and will not be stored at home. NOTE: This sheet is a summary. It may not cover all possible information. If you have questions about this medicine, talk to your doctor, pharmacist, or health care provider.  2020 Elsevier/Gold Standard (2016-11-04 14:37:52)  

## 2020-09-10 ENCOUNTER — Ambulatory Visit: Payer: Medicare Other

## 2020-09-12 ENCOUNTER — Encounter (HOSPITAL_COMMUNITY): Payer: Self-pay

## 2020-09-13 DIAGNOSIS — J301 Allergic rhinitis due to pollen: Secondary | ICD-10-CM | POA: Diagnosis not present

## 2020-09-13 DIAGNOSIS — J3081 Allergic rhinitis due to animal (cat) (dog) hair and dander: Secondary | ICD-10-CM | POA: Diagnosis not present

## 2020-09-13 DIAGNOSIS — J3089 Other allergic rhinitis: Secondary | ICD-10-CM | POA: Diagnosis not present

## 2020-09-16 ENCOUNTER — Other Ambulatory Visit: Payer: Self-pay | Admitting: Family Medicine

## 2020-09-24 NOTE — Progress Notes (Signed)
[place]  Telephone:(336) [phone] Fax:(336) [phone]     ID: BURNETTA KOHLS DOB: 1951-06-08  MR#: [id number]  GQQ#:[id number]  Patient Care Team: [name], MD as PCP - General [name], [name], MD as Consulting Physician (Allergy and Immunology) [name], OD as Referring Physician [name], RN as Oncology Nurse Navigator [name], RN as Oncology Nurse Navigator [name], MD as Consulting Physician (General Surgery) Kolson Chovanec, [name], MD as Consulting Physician (Oncology) [name], MD as Attending Physician (Radiation Oncology) [name], MD as Referring Physician (Dermatology) [name], MD as Consulting Physician (Cardiology) Chauncey Cruel, MD OTHER MD:  CHIEF COMPLAINT: triple positive breast cancer (s/p right mastectomy)  CURRENT TREATMENT: trastuzumab; tamoxifen   INTERVAL HISTORY: Theresa Bradley returns today for follow up and treatment of her triple positive breast cancer.  She continues on trastuzumab.  She is tolerating this with no side effects that she is aware of. Since her last visit, she underwent repeat echocardiogram on 08/28/2020 showing an ejection fraction of 55-60%, which is slightly lower than prior in 05/2020.  She began tamoxifen on 07/31/2020.  She is having some hot flashes during the day but more of a problem at night, with night sweats waking her up around 5 in the morning fairly regularly.  She also has some nocturia at times which is unrelated she never had headaches or cramps from this.  She does not have any vaginal wetness    REVIEW OF SYSTEMS: Theresa Bradley is feeling better, with more energy.  She was able to participate in her son's wedding 2 weeks ago.  She is not exercising regularly but is planning to join the gym again and take some walks she says.   COVID 19 VACCINATION STATUS: Status post Pfizer x2+ booster in October 2021   HISTORY OF CURRENT ILLNESS: From the original  intake note:   Theresa Bradley was my patient a little over 11 years ago when she underwent lumpectomy on 05/15/2008 for ductal carcinoma in situ and received radiation therapy under Dr. Valere Dross. 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  More recently, she presented to her PCP with a small palpable right breast lump around 11 o'clock, just superior and lateral to the nipple. She underwent bilateral diagnostic mammography with tomography and right breast ultrasonography at [facility] on 12/26/2019 showing: breast density category B; 6 mm superficial mass in the right breast at 11 o'clock in the retroareolar region; no enlarged adenopathy in right axilla.  Accordingly on 12/30/2019 she proceeded to biopsy of the right breast area in question. The pathology from this procedure ([id number]) showed: invasive ductal carcinoma, grade 2. Prognostic indicators significant for: estrogen receptor, 95% positive and progesterone receptor, 80% positive, both with strong staining intensity. Proliferation marker Ki67 at 20%. HER2 positive by immunohistochemistry (3+).  The patient's subsequent history is as detailed below.   PAST MEDICAL HISTORY: Past Medical History:  Diagnosis Date  . Anxiety   . Breast cancer ([facility]) 2009   right  . Cancer ([place])   . COUGH, CHRONIC 05/10/2010  . Dysrhythmia    occationally "skips a beat"  . Family history of brain cancer   . Family history of breast cancer   . Family history of lung cancer   . Family history of multiple myeloma   . Family history of skin  cancer   . GERD (gastroesophageal reflux disease)   . HYPERTENSION 04/16/2009  . HYPOTHYROIDISM 04/16/2009  . OSTEOARTHRITIS 04/16/2009  . Personal history of radiation therapy   . SENILE LENTIGO 06/10/2010    PAST SURGICAL HISTORY: Past Surgical History:  Procedure Laterality Date  . BREAST EXCISIONAL BIOPSY  Right   . BREAST EXCISIONAL BIOPSY Left   . BREAST LUMPECTOMY Right 2009   radiation only  . BREAST LUMPECTOMY WITH RADIOACTIVE SEED AND SENTINEL LYMPH NODE BIOPSY Right 01/31/2020   Procedure: RIGHT BREAST LUMPECTOMY WITH RADIOACTIVE SEED AND SENTINEL LYMPH NODE BIOPSY;  Surgeon: [name], MD;  Location: [facility];  Service: General;  Laterality: Right;  PEC BLOCK  . BREAST SURGERY  2009   X 2, cancer stage 0, lumpectomy  . [place]  . CHOLECYSTECTOMY    . DILATATION & CURETTAGE/HYSTEROSCOPY WITH MYOSURE  11/2015  . HERNIA REPAIR  2009  . JOINT REPLACEMENT  2008   hip  . OPEN SURGICAL REPAIR OF GLUTEAL TENDON Left 07/18/2019   Procedure: Left hip bearing surface revision with gluteal tendon repair;  Surgeon: Gaynelle Arabian, MD;  Location: [facility];  Service: Orthopedics;  Laterality: Left;  2 hrs  . PORTACATH PLACEMENT N/A 03/15/2020   Procedure: INSERTION PORT-A-CATH WITH ULTRASOUND GUIDANCE;  Surgeon: [name], MD;  Location: [facility];  Service: General;  Laterality: N/A;  . SIMPLE MASTECTOMY WITH AXILLARY SENTINEL NODE BIOPSY Right 03/15/2020   Procedure: RIGHT SIMPLE MASTECTOMY;  Surgeon: [name], MD;  Location: [facility];  Service: General;  Laterality: Right;    FAMILY HISTORY: Family History  Problem Relation Age of Onset  . Arthritis Other   . Hypertension Other   . Cancer Other        2 aunts  . Hyperlipidemia Father   . Dementia Father   . Skin cancer Father   . Skin cancer Mother   . Breast cancer Maternal Aunt 63  . Breast cancer Maternal Aunt        dx. 71  . Lung cancer Paternal Uncle   . Multiple myeloma Paternal Uncle   . Cancer Paternal Aunt        unknown type, dx. >50  . Brain cancer Cousin        dx. 43s, paternal cousin   The patient's father is 76 years old and the patient's mother is 71 years old as of February 2021.  The patient has one brother, no sisters.   A maternal aunt was diagnosed with breast cancer at age 71.  A paternal uncle was diagnosed with lung cancer in his 2s and another paternal uncle with a "blood cancer" in his late 27s.  There is no history of ovarian pancreatic or prostate cancer in the family to her knowledge.   GYNECOLOGIC HISTORY:  No LMP recorded. Patient is postmenopausal. Menarche: 69 years old Age at first live birth: 69 years old [place] P 2 LMP HRT no  Hysterectomy?  No BSO?  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  Her husband Patrick Jupiter runs and environmental company that for example checks for mold and then remediates.  Son Delfino Lovett, 75 years old, lives in [place] and works as a Clinical biochemist.  He has 2 sons.  The patient's son Catalina Antigua 38 lives in [place] and works in Chief Executive Officer.  He  married October 2021.  The patient is Episcopalian    ADVANCED DIRECTIVES: In the absence of any documentation to the contrary, the patient's spouse is their HCPOA.    HEALTH MAINTENANCE: Social History   Tobacco Use  . Smoking status: Never Smoker  . Smokeless tobacco: Never Used  . Tobacco comment: father smoked when she was younger   Vaping Use  . Vaping Use: Never used  Substance Use Topics  . Alcohol use: Yes    Comment: occasionally   . Drug use: No     Colonoscopy: 03/2009/[facility]  PAP: 10/2013, negative  Bone density: 11/2015, T score -1.7   Allergies  Allergen Reactions  . Codeine Sulfate Nausea And Vomiting  . Erythromycin Base Other (See Comments)    Stomach ache  . Esomeprazole Magnesium     REACTION: GI upset    Current Outpatient Medications  Medication Sig Dispense Refill  . ALPRAZolam (XANAX) 0.25 MG tablet TAKE 1/2 TABLET BY MOUTH AT BEDTIME AS NEEDED INSOMNIA 45 tablet 0  . amLODipine (NORVASC) 5 MG tablet TAKE 1 TABLET BY MOUTH EVERY DAY 90 tablet 3  . BIOTIN 5000 PO Take by  mouth.    . Calcium Carbonate-Vitamin D 600-200 MG-UNIT TABS Take 1 tablet by mouth daily.    . citalopram (CELEXA) 20 MG tablet Take 1 tablet (20 mg total) by mouth daily. 90 tablet 3  . docusate sodium (COLACE) 100 MG capsule Take 100 mg by mouth daily as needed for mild constipation.     Marland Kitchen EPIPEN 2-PAK 0.3 MG/0.3ML SOAJ injection Inject 0.3 mg into the muscle as needed for anaphylaxis.     . famotidine (PEPCID) 10 MG tablet Take 10 mg by mouth daily.     . fluocinonide gel (LIDEX) 0.05 %     . levothyroxine (SYNTHROID) 50 MCG tablet TAKE 1 TABLET BY MOUTH EVERY DAY 90 tablet 0  . magic mouthwash SOLN Take 5 mLs by mouth 4 (four) times daily as needed for mouth pain. 240 mL 1  . meloxicam (MOBIC) 15 MG tablet TAKE 1 TABLET BY MOUTH EVERY DAY 90 tablet 1  . mupirocin ointment (BACTROBAN) 2 % APPLY TO AFFECTED AREA EVERY DAY    . omeprazole (PRILOSEC) 20 MG capsule Take 20 mg by mouth daily.    Marland Kitchen tretinoin (RETIN-A) 0.025 % cream Apply topically at bedtime. 45 g 6   No current facility-administered medications for this visit.    OBJECTIVE: White woman in no acute distress  Vitals:   09/25/20 1258  BP: (!) 165/66  Pulse: (!) 51  Resp: 17  Temp: (!) 97 F (36.1 C)  SpO2: 98%     Body mass index is 25.46 kg/m.   Wt Readings from Last 3 Encounters:  09/25/20 148 lb 4.8 oz (67.3 kg)  08/14/20 151 lb 8 oz (68.7 kg)  07/24/20 149 lb 9.6 oz (67.9 kg)     ECOG FS:1 - Symptomatic but completely ambulatory  Sclerae unicteric, EOMs intact Wearing a mask No cervical or supraclavicular adenopathy Lungs no rales or rhonchi Heart regular rate and rhythm Abd soft, nontender, positive bowel sounds MSK no focal spinal tenderness, no upper extremity lymphedema Neuro: nonfocal, well oriented, appropriate affect Breasts: Deferred   LAB RESULTS:  CMP     Component Value Date/Time   NA 139 09/04/2020 1220   K 3.7 09/04/2020 1220   CL 105 09/04/2020 1220   CO2 29 09/04/2020 1220    GLUCOSE 140 (H) 09/04/2020 1220  BUN 15 09/04/2020 1220   CREATININE 0.87 09/04/2020 1220   CREATININE 0.81 01/11/2020 1209   CALCIUM 9.3 09/04/2020 1220   PROT 7.3 09/04/2020 1220   ALBUMIN 3.6 09/04/2020 1220   AST 14 (L) 09/04/2020 1220   AST 14 (L) 01/11/2020 1209   ALT 12 09/04/2020 1220   ALT 13 01/11/2020 1209   ALKPHOS 55 09/04/2020 1220   BILITOT 0.4 09/04/2020 1220   BILITOT 0.5 01/11/2020 1209   GFRNONAA >60 09/04/2020 1220   GFRNONAA >60 01/11/2020 1209   GFRAA >60 08/14/2020 1227   GFRAA >60 01/11/2020 1209    No results found for: TOTALPROTELP, ALBUMINELP, A1GS, A2GS, BETS, BETA2SER, GAMS, MSPIKE, SPEI  Lab Results  Component Value Date   WBC 4.2 09/04/2020   NEUTROABS 2.2 09/04/2020   HGB 12.8 09/04/2020   HCT 38.9 09/04/2020   MCV 91.1 09/04/2020   PLT 260 09/04/2020    No results found for: LABCA2  No components found for: JDBZMC802  No results for input(s): INR in the last 168 hours.  No results found for: LABCA2  No results found for: MVV612  No results found for: AES975  No results found for: PYY511  No results found for: CA2729  No components found for: HGQUANT  No results found for: CEA1 / No results found for: CEA1   No results found for: AFPTUMOR  No results found for: CHROMOGRNA  No results found for: KPAFRELGTCHN, LAMBDASER, KAPLAMBRATIO (kappa/lambda light chains)  No results found for: HGBA, HGBA2QUANT, HGBFQUANT, HGBSQUAN (Hemoglobinopathy evaluation)   No results found for: LDH  No results found for: IRON, TIBC, IRONPCTSAT (Iron and TIBC)  No results found for: FERRITIN  Urinalysis    Component Value Date/Time   COLORURINE yellow 05/24/2010 0855   APPEARANCEUR Clear 05/24/2010 0855   LABSPEC 1.015 05/24/2010 0855   PHURINE 6.0 05/24/2010 0855   GLUCOSEU NEGATIVE 09/22/2007 1110   HGBUR negative 05/24/2010 0855   BILIRUBINUR n 06/29/2019 1533   KETONESUR NEGATIVE 09/22/2007 1110   PROTEINUR Negative  06/29/2019 1533   PROTEINUR NEGATIVE 09/22/2007 1110   UROBILINOGEN 0.2 06/29/2019 1533   UROBILINOGEN 0.2 05/24/2010 0855   NITRITE n 06/29/2019 1533   NITRITE negative 05/24/2010 0855   LEUKOCYTESUR Negative 06/29/2019 1533    STUDIES: ECHOCARDIOGRAM COMPLETE  Result Date: 08/28/2020    ECHOCARDIOGRAM REPORT   Patient Name:   ARITHA HUCKEBA Schlachter Date of Exam: 08/28/2020 Medical Rec #:  [id number]       Height:       64.0 in Accession #:    [id number]      Weight:       151.5 lb Date of Birth:  04-08-1951        BSA:          1.739 m Patient Age:    57 years        BP:           155/67 mmHg Patient Gender: F               HR:           50 bpm. Exam Location:  Outpatient Procedure: 2D Echo Indications:    chemo evaluation  History:        Patient has prior history of Echocardiogram examinations, most                 recent 05/30/2020. Risk Factors:Hypertension. Breast cancer.  Sonographer:    Jannett Celestine RDCS (AE) Referring Phys: [phone] Deontae Robson  C Journii Nierman IMPRESSIONS  1. GLS-19.2%.  2. Left ventricular ejection fraction, by estimation, is 55 to 60%. The left ventricle has normal function. The left ventricle has no regional wall motion abnormalities. There is mild left ventricular hypertrophy. Left ventricular diastolic parameters are consistent with Grade I diastolic dysfunction (impaired relaxation).  3. Right ventricular systolic function is normal. The right ventricular size is normal.  4. The mitral valve is normal in structure. Trivial mitral valve regurgitation. No evidence of mitral stenosis.  5. The aortic valve is tricuspid. Aortic valve regurgitation is not visualized. Mild aortic valve sclerosis is present, with no evidence of aortic valve stenosis.  6. The inferior vena cava is normal in size with greater than 50% respiratory variability, suggesting right atrial pressure of 3 mmHg. FINDINGS  Left Ventricle: Left ventricular ejection fraction, by estimation, is 55 to 60%. The left ventricle has normal  function. The left ventricle has no regional wall motion abnormalities. The left ventricular internal cavity size was normal in size. There is  mild left ventricular hypertrophy. Left ventricular diastolic parameters are consistent with Grade I diastolic dysfunction (impaired relaxation). Right Ventricle: The right ventricular size is normal. Right ventricular systolic function is normal. Left Atrium: Left atrial size was normal in size. Right Atrium: Right atrial size was normal in size. Pericardium: There is no evidence of pericardial effusion. Mitral Valve: The mitral valve is normal in structure. Mild mitral annular calcification. Trivial mitral valve regurgitation. No evidence of mitral valve stenosis. Tricuspid Valve: The tricuspid valve is normal in structure. Tricuspid valve regurgitation is not demonstrated. No evidence of tricuspid stenosis. Aortic Valve: The aortic valve is tricuspid. Aortic valve regurgitation is not visualized. Mild aortic valve sclerosis is present, with no evidence of aortic valve stenosis. Pulmonic Valve: The pulmonic valve was normal in structure. Pulmonic valve regurgitation is trivial. No evidence of pulmonic stenosis. Aorta: The aortic root is normal in size and structure. Venous: The inferior vena cava is normal in size with greater than 50% respiratory variability, suggesting right atrial pressure of 3 mmHg. IAS/Shunts: No atrial level shunt detected by color flow Doppler. Additional Comments: GLS-19.2%.  LEFT VENTRICLE PLAX 2D LVIDd:         4.00 cm  Diastology LVIDs:         2.70 cm  LV e' medial:    5.98 cm/s LV PW:         1.30 cm  LV E/e' medial:  12.3 LV IVS:        1.50 cm  LV e' lateral:   7.62 cm/s LVOT diam:     2.20 cm  LV E/e' lateral: 9.6 LV SV:         77 LV SV Index:   44 LVOT Area:     3.80 cm  LEFT ATRIUM           Index LA diam:      3.40 cm 1.96 cm/m LA Vol (A2C): 39.8 ml 22.89 ml/m  AORTIC VALVE LVOT Vmax:   81.60 cm/s LVOT Vmean:  62.800 cm/s LVOT VTI:     0.202 m  AORTA Ao Root diam: 3.20 cm MITRAL VALVE MV Area (PHT): 2.73 cm    SHUNTS MV Decel Time: 278 msec    Systemic VTI:  0.20 m MV E velocity: 73.30 cm/s  Systemic Diam: 2.20 cm MV A velocity: 79.30 cm/s MV E/A ratio:  0.92 Kirk Ruths MD Electronically signed by Kirk Ruths MD Signature Date/Time: 08/28/2020/1:41:11 PM  Final      ELIGIBLE FOR AVAILABLE RESEARCH PROTOCOL: no  ASSESSMENT: 69 y.o. [place] woman status post right breast periareolar biopsy 12/30/2019 for a clinical T1a N0, stage I invasive ductal carcinoma, grade 2, estrogen and progesterone receptor positive, HER-2 amplified, with an MIB-one of 20%.  (1) history of prior right lumpectomy June 2009 for ductal carcinoma in situ  (a) status post adjuvant radiation  (b) did not receive antiestrogens  (2) status post repeat right lumpectomy and sentinel lymph node sampling 01/31/2020 for a pT1c pN0, stage IA invasive ductal carcinoma, grade 2, with positive margins  (a) total one sentinel node removed  (3) s/p right mastectomy 03/15/2020 showing residual invasive ductal carcinoma measuring 0.9 cm, but negative margins (added to prior 1.1 lumpectomy, still T1 N0 or stage IA)  (a) one additional axillary node was removed (total 2 right axillary nodes removed)  (3) adjuvant chemotherapy with paclitaxel and trastuzumab weekly x 12 started 04/24/2020, completing 10 doses 06/26/2020   (a) final 2 doses omitted secondary to peripheral neuropathy  (4) trastuzumab to be continued every 21 days to total one year (through May 2022).  (a) echo 01/17/2020 shows an ejection fraction in the 60-65% range  (b) echo 05/30/2020 shows an ejection fraction of 60-65%.  (c) echo 08/28/2020 shows an ejection fraction in the 55-60% range  (5) started tamoxifen 07/31/2020  (a) Bone density in 11/2015 shows osteopenia with T score of -1.7.  (6) genetics testing 02/01/2020 through the [facility] STAT panel found no deleterious  mutations then ATM, BRCA1, BRCA2, CDH1, CHEK2, PALB2, PTEN, STK11 and TP53.    PLAN: Daiana is now three quarters of a year out from definitive surgery for her breast cancer, with no evidence of disease activity.  She is recovering well from her treatments.  She is tolerating her current treatment generally well also.  She is having some night sweats from the tamoxifen.  I have found gabapentin to be very helpful with this.  We discussed that at length today.  She has a good understanding of the possible toxicities side effects and complications of this agent and I have gone ahead and place an order for her to start it.  We discussed the fact that there has been a minimum change in her echo.  This may be simply be reader variation but I have asked Dr. Ander Slade to review.  In any case we are proceeding with treatment today.  If all continues well she will have her next echo in 3 months and see me at that time.  She knows to call for any other issue that may develop before the next visit  Total encounter time 25 minutes.Sarajane Jews C. Kylyn Mcdade, MD 09/25/20 1:01 PM Medical Oncology and Hematology [facility] [place], [place] [address] Tel. [phone]    Fax. [phone]   I, Wilburn Mylar, am acting as scribe for Dr. [name]. Nayomi Tabron.  I, Lurline Del MD, have reviewed the above documentation for accuracy and completeness, and I agree with the above.   *Total Encounter Time as defined by the Centers for Medicare and Medicaid Services includes, in addition to the face-to-face time of a patient visit (documented in the note above) non-face-to-face time: obtaining and reviewing outside history, ordering and reviewing medications, tests or procedures, care coordination (communications with other health care professionals or caregivers) and documentation in the medical record.

## 2020-09-25 ENCOUNTER — Other Ambulatory Visit: Payer: Self-pay

## 2020-09-25 ENCOUNTER — Inpatient Hospital Stay: Payer: Medicare Other | Attending: Oncology

## 2020-09-25 ENCOUNTER — Inpatient Hospital Stay: Payer: Medicare Other

## 2020-09-25 ENCOUNTER — Encounter: Payer: Self-pay | Admitting: *Deleted

## 2020-09-25 ENCOUNTER — Inpatient Hospital Stay (HOSPITAL_BASED_OUTPATIENT_CLINIC_OR_DEPARTMENT_OTHER): Payer: Medicare Other | Admitting: Oncology

## 2020-09-25 VITALS — BP 165/66 | HR 51 | Temp 97.0°F | Resp 17 | Ht 64.0 in | Wt 148.3 lb

## 2020-09-25 DIAGNOSIS — C50111 Malignant neoplasm of central portion of right female breast: Secondary | ICD-10-CM | POA: Diagnosis not present

## 2020-09-25 DIAGNOSIS — Z95828 Presence of other vascular implants and grafts: Secondary | ICD-10-CM

## 2020-09-25 DIAGNOSIS — Z170 Estrogen receptor positive status [ER+]: Secondary | ICD-10-CM | POA: Diagnosis not present

## 2020-09-25 DIAGNOSIS — Z5112 Encounter for antineoplastic immunotherapy: Secondary | ICD-10-CM | POA: Diagnosis not present

## 2020-09-25 DIAGNOSIS — C50411 Malignant neoplasm of upper-outer quadrant of right female breast: Secondary | ICD-10-CM

## 2020-09-25 LAB — CBC WITH DIFFERENTIAL/PLATELET
Abs Immature Granulocytes: 0.02 10*3/uL (ref 0.00–0.07)
Basophils Absolute: 0 10*3/uL (ref 0.0–0.1)
Basophils Relative: 1 %
Eosinophils Absolute: 0.1 10*3/uL (ref 0.0–0.5)
Eosinophils Relative: 3 %
HCT: 40.1 % (ref 36.0–46.0)
Hemoglobin: 13.1 g/dL (ref 12.0–15.0)
Immature Granulocytes: 1 %
Lymphocytes Relative: 35 %
Lymphs Abs: 1.4 10*3/uL (ref 0.7–4.0)
MCH: 29.5 pg (ref 26.0–34.0)
MCHC: 32.7 g/dL (ref 30.0–36.0)
MCV: 90.3 fL (ref 80.0–100.0)
Monocytes Absolute: 0.3 10*3/uL (ref 0.1–1.0)
Monocytes Relative: 8 %
Neutro Abs: 2.2 10*3/uL (ref 1.7–7.7)
Neutrophils Relative %: 52 %
Platelets: 246 10*3/uL (ref 150–400)
RBC: 4.44 MIL/uL (ref 3.87–5.11)
RDW: 11.8 % (ref 11.5–15.5)
WBC: 4.1 10*3/uL (ref 4.0–10.5)
nRBC: 0 % (ref 0.0–0.2)

## 2020-09-25 LAB — COMPREHENSIVE METABOLIC PANEL
ALT: 12 U/L (ref 0–44)
AST: 15 U/L (ref 15–41)
Albumin: 3.5 g/dL (ref 3.5–5.0)
Alkaline Phosphatase: 49 U/L (ref 38–126)
Anion gap: 6 (ref 5–15)
BUN: 13 mg/dL (ref 8–23)
CO2: 27 mmol/L (ref 22–32)
Calcium: 8.6 mg/dL — ABNORMAL LOW (ref 8.9–10.3)
Chloride: 105 mmol/L (ref 98–111)
Creatinine, Ser: 0.89 mg/dL (ref 0.44–1.00)
GFR, Estimated: >60 mL/min (ref >60)
Glucose, Bld: 152 mg/dL — ABNORMAL HIGH (ref 70–99)
Potassium: 3.6 mmol/L (ref 3.5–5.1)
Sodium: 138 mmol/L (ref 135–145)
Total Bilirubin: 0.5 mg/dL (ref 0.3–1.2)
Total Protein: 7 g/dL (ref 6.5–8.1)

## 2020-09-25 MED ORDER — TRASTUZUMAB-DKST CHEMO 150 MG IV SOLR
6.0000 mg/kg | Freq: Once | INTRAVENOUS | Status: AC
  Administered 2020-09-25: 399 mg via INTRAVENOUS
  Filled 2020-09-25: qty 19

## 2020-09-25 MED ORDER — GABAPENTIN 300 MG PO CAPS: 300.0000 mg | ORAL_CAPSULE | Freq: Every day | ORAL | 4 refills | Status: DC

## 2020-09-25 MED ORDER — DIPHENHYDRAMINE HCL 12.5 MG/5ML PO ELIX
12.5000 mg | ORAL_SOLUTION | Freq: Once | ORAL | Status: AC
  Administered 2020-09-25: 12.5 mg via ORAL

## 2020-09-25 MED ORDER — SODIUM CHLORIDE 0.9% FLUSH
10.0000 mL | INTRAVENOUS | Status: DC | PRN
  Filled 2020-09-25: qty 10

## 2020-09-25 MED ORDER — HEPARIN SOD (PORK) LOCK FLUSH 100 UNIT/ML IV SOLN
500.0000 [IU] | Freq: Once | INTRAVENOUS | Status: DC | PRN
  Filled 2020-09-25: qty 5

## 2020-09-25 MED ORDER — DIPHENHYDRAMINE HCL 12.5 MG/5ML PO ELIX
ORAL_SOLUTION | ORAL | Status: AC
  Filled 2020-09-25: qty 5

## 2020-09-25 MED ORDER — ACETAMINOPHEN 325 MG PO TABS
650.0000 mg | ORAL_TABLET | Freq: Once | ORAL | Status: AC
  Administered 2020-09-25: 650 mg via ORAL

## 2020-09-25 MED ORDER — SODIUM CHLORIDE 0.9 % IV SOLN
Freq: Once | INTRAVENOUS | Status: AC
  Filled 2020-09-25: qty 250

## 2020-09-25 MED ORDER — SODIUM CHLORIDE 0.9% FLUSH
10.0000 mL | Freq: Once | INTRAVENOUS | Status: AC
  Administered 2020-09-25: 10 mL
  Filled 2020-09-25: qty 10

## 2020-09-25 MED ORDER — ACETAMINOPHEN 325 MG PO TABS
ORAL_TABLET | ORAL | Status: AC
  Filled 2020-09-25: qty 2

## 2020-09-25 NOTE — Patient Instructions (Signed)
Trastuzumab injection for infusion What is this medicine? TRASTUZUMAB (tras TOO zoo mab) is a monoclonal antibody. It is used to treat breast cancer and stomach cancer. This medicine may be used for other purposes; ask your health care provider or pharmacist if you have questions. COMMON BRAND NAME(S): Herceptin, Herzuma, KANJINTI, Ogivri, Ontruzant, Trazimera What should I tell my health care provider before I take this medicine? They need to know if you have any of these conditions:  heart disease  heart failure  lung or breathing disease, like asthma  an unusual or allergic reaction to trastuzumab, benzyl alcohol, or other medications, foods, dyes, or preservatives  pregnant or trying to get pregnant  breast-feeding How should I use this medicine? This drug is given as an infusion into a vein. It is administered in a hospital or clinic by a specially trained health care professional. Talk to your pediatrician regarding the use of this medicine in children. This medicine is not approved for use in children. Overdosage: If you think you have taken too much of this medicine contact a poison control center or emergency room at once. NOTE: This medicine is only for you. Do not share this medicine with others. What if I miss a dose? It is important not to miss a dose. Call your doctor or health care professional if you are unable to keep an appointment. What may interact with this medicine? This medicine may interact with the following medications:  certain types of chemotherapy, such as daunorubicin, doxorubicin, epirubicin, and idarubicin This list may not describe all possible interactions. Give your health care provider a list of all the medicines, herbs, non-prescription drugs, or dietary supplements you use. Also tell them if you smoke, drink alcohol, or use illegal drugs. Some items may interact with your medicine. What should I watch for while using this medicine? Visit your  doctor for checks on your progress. Report any side effects. Continue your course of treatment even though you feel ill unless your doctor tells you to stop. Call your doctor or health care professional for advice if you get a fever, chills or sore throat, or other symptoms of a cold or flu. Do not treat yourself. Try to avoid being around people who are sick. You may experience fever, chills and shaking during your first infusion. These effects are usually mild and can be treated with other medicines. Report any side effects during the infusion to your health care professional. Fever and chills usually do not happen with later infusions. Do not become pregnant while taking this medicine or for 7 months after stopping it. Women should inform their doctor if they wish to become pregnant or think they might be pregnant. Women of child-bearing potential will need to have a negative pregnancy test before starting this medicine. There is a potential for serious side effects to an unborn child. Talk to your health care professional or pharmacist for more information. Do not breast-feed an infant while taking this medicine or for 7 months after stopping it. Women must use effective birth control with this medicine. What side effects may I notice from receiving this medicine? Side effects that you should report to your doctor or health care professional as soon as possible:  allergic reactions like skin rash, itching or hives, swelling of the face, lips, or tongue  chest pain or palpitations  cough  dizziness  feeling faint or lightheaded, falls  fever  general ill feeling or flu-like symptoms  signs of worsening heart failure like   breathing problems; swelling in your legs and feet  unusually weak or tired Side effects that usually do not require medical attention (report to your doctor or health care professional if they continue or are bothersome):  bone pain  changes in  taste  diarrhea  joint pain  nausea/vomiting  weight loss This list may not describe all possible side effects. Call your doctor for medical advice about side effects. You may report side effects to FDA at 1-800-FDA-1088. Where should I keep my medicine? This drug is given in a hospital or clinic and will not be stored at home. NOTE: This sheet is a summary. It may not cover all possible information. If you have questions about this medicine, talk to your doctor, pharmacist, or health care provider.  2020 Elsevier/Gold Standard (2016-11-04 14:37:52)  

## 2020-09-25 NOTE — Patient Instructions (Signed)

## 2020-09-27 DIAGNOSIS — J3081 Allergic rhinitis due to animal (cat) (dog) hair and dander: Secondary | ICD-10-CM | POA: Diagnosis not present

## 2020-09-27 DIAGNOSIS — J3089 Other allergic rhinitis: Secondary | ICD-10-CM | POA: Diagnosis not present

## 2020-09-27 DIAGNOSIS — J301 Allergic rhinitis due to pollen: Secondary | ICD-10-CM | POA: Diagnosis not present

## 2020-09-28 ENCOUNTER — Telehealth: Payer: Self-pay | Admitting: Oncology

## 2020-09-28 NOTE — Telephone Encounter (Signed)
Scheduled appt per 11/2 LOS - pt is aware of appt on 11/23 and will check my chart for the rest of the appts.

## 2020-10-01 ENCOUNTER — Encounter: Payer: Self-pay | Admitting: *Deleted

## 2020-10-08 ENCOUNTER — Ambulatory Visit: Payer: Medicare Other

## 2020-10-11 DIAGNOSIS — J3081 Allergic rhinitis due to animal (cat) (dog) hair and dander: Secondary | ICD-10-CM | POA: Diagnosis not present

## 2020-10-11 DIAGNOSIS — J3089 Other allergic rhinitis: Secondary | ICD-10-CM | POA: Diagnosis not present

## 2020-10-11 DIAGNOSIS — J301 Allergic rhinitis due to pollen: Secondary | ICD-10-CM | POA: Diagnosis not present

## 2020-10-12 DIAGNOSIS — C50911 Malignant neoplasm of unspecified site of right female breast: Secondary | ICD-10-CM | POA: Diagnosis not present

## 2020-10-16 ENCOUNTER — Inpatient Hospital Stay: Payer: Medicare Other

## 2020-10-16 ENCOUNTER — Other Ambulatory Visit: Payer: Self-pay

## 2020-10-16 VITALS — BP 157/71 | HR 54 | Temp 98.1°F | Resp 18 | Ht 64.0 in | Wt 149.8 lb

## 2020-10-16 DIAGNOSIS — C50411 Malignant neoplasm of upper-outer quadrant of right female breast: Secondary | ICD-10-CM

## 2020-10-16 DIAGNOSIS — Z170 Estrogen receptor positive status [ER+]: Secondary | ICD-10-CM

## 2020-10-16 DIAGNOSIS — Z95828 Presence of other vascular implants and grafts: Secondary | ICD-10-CM

## 2020-10-16 DIAGNOSIS — C50111 Malignant neoplasm of central portion of right female breast: Secondary | ICD-10-CM | POA: Diagnosis not present

## 2020-10-16 DIAGNOSIS — Z5112 Encounter for antineoplastic immunotherapy: Secondary | ICD-10-CM | POA: Diagnosis not present

## 2020-10-16 LAB — COMPREHENSIVE METABOLIC PANEL
ALT: 8 U/L (ref 0–44)
AST: 13 U/L — ABNORMAL LOW (ref 15–41)
Albumin: 3.4 g/dL — ABNORMAL LOW (ref 3.5–5.0)
Alkaline Phosphatase: 45 U/L (ref 38–126)
Anion gap: 9 (ref 5–15)
BUN: 17 mg/dL (ref 8–23)
CO2: 25 mmol/L (ref 22–32)
Calcium: 8.4 mg/dL — ABNORMAL LOW (ref 8.9–10.3)
Chloride: 107 mmol/L (ref 98–111)
Creatinine, Ser: 0.85 mg/dL (ref 0.44–1.00)
GFR, Estimated: >60 mL/min (ref >60)
Glucose, Bld: 96 mg/dL (ref 70–99)
Potassium: 3.7 mmol/L (ref 3.5–5.1)
Sodium: 141 mmol/L (ref 135–145)
Total Bilirubin: 0.3 mg/dL (ref 0.3–1.2)
Total Protein: 7 g/dL (ref 6.5–8.1)

## 2020-10-16 LAB — CBC WITH DIFFERENTIAL/PLATELET
Abs Immature Granulocytes: 0.01 10*3/uL (ref 0.00–0.07)
Basophils Absolute: 0 10*3/uL (ref 0.0–0.1)
Basophils Relative: 1 %
Eosinophils Absolute: 0.3 10*3/uL (ref 0.0–0.5)
Eosinophils Relative: 5 %
HCT: 37.7 % (ref 36.0–46.0)
Hemoglobin: 12.3 g/dL (ref 12.0–15.0)
Immature Granulocytes: 0 %
Lymphocytes Relative: 35 %
Lymphs Abs: 1.9 10*3/uL (ref 0.7–4.0)
MCH: 28.9 pg (ref 26.0–34.0)
MCHC: 32.6 g/dL (ref 30.0–36.0)
MCV: 88.5 fL (ref 80.0–100.0)
Monocytes Absolute: 0.6 10*3/uL (ref 0.1–1.0)
Monocytes Relative: 11 %
Neutro Abs: 2.6 10*3/uL (ref 1.7–7.7)
Neutrophils Relative %: 48 %
Platelets: 249 10*3/uL (ref 150–400)
RBC: 4.26 MIL/uL (ref 3.87–5.11)
RDW: 12 % (ref 11.5–15.5)
WBC: 5.4 10*3/uL (ref 4.0–10.5)
nRBC: 0 % (ref 0.0–0.2)

## 2020-10-16 MED ORDER — HEPARIN SOD (PORK) LOCK FLUSH 100 UNIT/ML IV SOLN
500.0000 [IU] | Freq: Once | INTRAVENOUS | Status: AC | PRN
  Administered 2020-10-16: 500 [IU]
  Filled 2020-10-16: qty 5

## 2020-10-16 MED ORDER — ACETAMINOPHEN 325 MG PO TABS
650.0000 mg | ORAL_TABLET | Freq: Once | ORAL | Status: AC
  Administered 2020-10-16: 650 mg via ORAL

## 2020-10-16 MED ORDER — SODIUM CHLORIDE 0.9 % IV SOLN
Freq: Once | INTRAVENOUS | Status: AC
  Filled 2020-10-16: qty 250

## 2020-10-16 MED ORDER — ACETAMINOPHEN 325 MG PO TABS
ORAL_TABLET | ORAL | Status: AC
  Filled 2020-10-16: qty 2

## 2020-10-16 MED ORDER — DIPHENHYDRAMINE HCL 12.5 MG/5ML PO ELIX
12.5000 mg | ORAL_SOLUTION | Freq: Once | ORAL | Status: AC
  Administered 2020-10-16: 12.5 mg via ORAL

## 2020-10-16 MED ORDER — TRASTUZUMAB-DKST CHEMO 150 MG IV SOLR
6.0000 mg/kg | Freq: Once | INTRAVENOUS | Status: AC
  Administered 2020-10-16: 399 mg via INTRAVENOUS
  Filled 2020-10-16: qty 19

## 2020-10-16 MED ORDER — SODIUM CHLORIDE 0.9% FLUSH
10.0000 mL | INTRAVENOUS | Status: DC | PRN
  Administered 2020-10-16: 10 mL
  Filled 2020-10-16: qty 10

## 2020-10-16 MED ORDER — DIPHENHYDRAMINE HCL 12.5 MG/5ML PO ELIX
ORAL_SOLUTION | ORAL | Status: AC
  Filled 2020-10-16: qty 5

## 2020-10-16 MED ORDER — SODIUM CHLORIDE 0.9% FLUSH
10.0000 mL | Freq: Once | INTRAVENOUS | Status: AC
  Administered 2020-10-16: 10 mL
  Filled 2020-10-16: qty 10

## 2020-10-16 NOTE — Progress Notes (Signed)
Patient stable upon discharge from the [facility]. Patient presenting with no signs of acute distress.

## 2020-10-16 NOTE — Patient Instructions (Signed)
[facility] Discharge Instructions for Patients Receiving Chemotherapy  Today you received the following chemotherapy agents: Trastuzumab   To help prevent nausea and vomiting after your treatment, we encourage you to take your nausea medication  as prescribed.    If you develop nausea and vomiting that is not controlled by your nausea medication, call the clinic.   BELOW ARE SYMPTOMS THAT SHOULD BE REPORTED IMMEDIATELY:  *FEVER GREATER THAN 100.5 F  *CHILLS WITH OR WITHOUT FEVER  NAUSEA AND VOMITING THAT IS NOT CONTROLLED WITH YOUR NAUSEA MEDICATION  *UNUSUAL SHORTNESS OF BREATH  *UNUSUAL BRUISING OR BLEEDING  TENDERNESS IN MOUTH AND THROAT WITH OR WITHOUT PRESENCE OF ULCERS  *URINARY PROBLEMS  *BOWEL PROBLEMS  UNUSUAL RASH Items with * indicate a potential emergency and should be followed up as soon as possible.  Feel free to call the clinic should you have any questions or concerns. The clinic phone number is [phone].  Please show the CHEMO ALERT CARD at check-in to the Emergency Department and triage nurse.   

## 2020-10-25 DIAGNOSIS — J301 Allergic rhinitis due to pollen: Secondary | ICD-10-CM | POA: Diagnosis not present

## 2020-10-25 DIAGNOSIS — J3081 Allergic rhinitis due to animal (cat) (dog) hair and dander: Secondary | ICD-10-CM | POA: Diagnosis not present

## 2020-10-25 DIAGNOSIS — J3089 Other allergic rhinitis: Secondary | ICD-10-CM | POA: Diagnosis not present

## 2020-10-29 DIAGNOSIS — J301 Allergic rhinitis due to pollen: Secondary | ICD-10-CM | POA: Diagnosis not present

## 2020-10-29 DIAGNOSIS — J3089 Other allergic rhinitis: Secondary | ICD-10-CM | POA: Diagnosis not present

## 2020-10-29 DIAGNOSIS — J3081 Allergic rhinitis due to animal (cat) (dog) hair and dander: Secondary | ICD-10-CM | POA: Diagnosis not present

## 2020-11-06 ENCOUNTER — Other Ambulatory Visit: Payer: Self-pay

## 2020-11-06 ENCOUNTER — Inpatient Hospital Stay: Payer: Medicare Other

## 2020-11-06 ENCOUNTER — Inpatient Hospital Stay: Payer: Medicare Other | Attending: Oncology

## 2020-11-06 VITALS — BP 146/73 | HR 51 | Temp 97.9°F | Resp 18 | Wt 149.8 lb

## 2020-11-06 DIAGNOSIS — Z5112 Encounter for antineoplastic immunotherapy: Secondary | ICD-10-CM | POA: Insufficient documentation

## 2020-11-06 DIAGNOSIS — C50411 Malignant neoplasm of upper-outer quadrant of right female breast: Secondary | ICD-10-CM

## 2020-11-06 DIAGNOSIS — C50111 Malignant neoplasm of central portion of right female breast: Secondary | ICD-10-CM | POA: Diagnosis not present

## 2020-11-06 DIAGNOSIS — Z170 Estrogen receptor positive status [ER+]: Secondary | ICD-10-CM

## 2020-11-06 DIAGNOSIS — Z95828 Presence of other vascular implants and grafts: Secondary | ICD-10-CM

## 2020-11-06 LAB — COMPREHENSIVE METABOLIC PANEL
ALT: 13 U/L (ref 0–44)
AST: 16 U/L (ref 15–41)
Albumin: 3.3 g/dL — ABNORMAL LOW (ref 3.5–5.0)
Alkaline Phosphatase: 45 U/L (ref 38–126)
Anion gap: 5 (ref 5–15)
BUN: 13 mg/dL (ref 8–23)
CO2: 28 mmol/L (ref 22–32)
Calcium: 8.6 mg/dL — ABNORMAL LOW (ref 8.9–10.3)
Chloride: 107 mmol/L (ref 98–111)
Creatinine, Ser: 0.82 mg/dL (ref 0.44–1.00)
GFR, Estimated: >60 mL/min (ref >60)
Glucose, Bld: 116 mg/dL — ABNORMAL HIGH (ref 70–99)
Potassium: 3.7 mmol/L (ref 3.5–5.1)
Sodium: 140 mmol/L (ref 135–145)
Total Bilirubin: 0.4 mg/dL (ref 0.3–1.2)
Total Protein: 6.9 g/dL (ref 6.5–8.1)

## 2020-11-06 LAB — CBC WITH DIFFERENTIAL/PLATELET
Abs Immature Granulocytes: 0.01 10*3/uL (ref 0.00–0.07)
Basophils Absolute: 0 10*3/uL (ref 0.0–0.1)
Basophils Relative: 1 %
Eosinophils Absolute: 0.2 10*3/uL (ref 0.0–0.5)
Eosinophils Relative: 5 %
HCT: 38.7 % (ref 36.0–46.0)
Hemoglobin: 12.5 g/dL (ref 12.0–15.0)
Immature Granulocytes: 0 %
Lymphocytes Relative: 33 %
Lymphs Abs: 1.5 10*3/uL (ref 0.7–4.0)
MCH: 28.6 pg (ref 26.0–34.0)
MCHC: 32.3 g/dL (ref 30.0–36.0)
MCV: 88.6 fL (ref 80.0–100.0)
Monocytes Absolute: 0.4 10*3/uL (ref 0.1–1.0)
Monocytes Relative: 8 %
Neutro Abs: 2.3 10*3/uL (ref 1.7–7.7)
Neutrophils Relative %: 53 %
Platelets: 254 10*3/uL (ref 150–400)
RBC: 4.37 MIL/uL (ref 3.87–5.11)
RDW: 13 % (ref 11.5–15.5)
WBC: 4.4 10*3/uL (ref 4.0–10.5)
nRBC: 0 % (ref 0.0–0.2)

## 2020-11-06 MED ORDER — SODIUM CHLORIDE 0.9% FLUSH
10.0000 mL | Freq: Once | INTRAVENOUS | Status: AC
  Administered 2020-11-06: 10 mL
  Filled 2020-11-06: qty 10

## 2020-11-06 MED ORDER — ACETAMINOPHEN 325 MG PO TABS
650.0000 mg | ORAL_TABLET | Freq: Once | ORAL | Status: AC
Start: 2020-11-06 — End: 2020-11-06
  Administered 2020-11-06: 650 mg via ORAL

## 2020-11-06 MED ORDER — TRASTUZUMAB-DKST CHEMO 150 MG IV SOLR
6.0000 mg/kg | Freq: Once | INTRAVENOUS | Status: AC
  Administered 2020-11-06: 399 mg via INTRAVENOUS
  Filled 2020-11-06: qty 19

## 2020-11-06 MED ORDER — SODIUM CHLORIDE 0.9 % IV SOLN
Freq: Once | INTRAVENOUS | Status: AC
  Filled 2020-11-06: qty 250

## 2020-11-06 MED ORDER — ACETAMINOPHEN 325 MG PO TABS
ORAL_TABLET | ORAL | Status: AC
  Filled 2020-11-06: qty 2

## 2020-11-06 MED ORDER — DIPHENHYDRAMINE HCL 12.5 MG/5ML PO ELIX
12.5000 mg | ORAL_SOLUTION | Freq: Once | ORAL | Status: AC
Start: 2020-11-06 — End: 2020-11-06
  Administered 2020-11-06: 12.5 mg via ORAL

## 2020-11-06 MED ORDER — DIPHENHYDRAMINE HCL 12.5 MG/5ML PO ELIX
ORAL_SOLUTION | ORAL | Status: AC
  Filled 2020-11-06: qty 5

## 2020-11-06 MED ORDER — HEPARIN SOD (PORK) LOCK FLUSH 100 UNIT/ML IV SOLN
500.0000 [IU] | Freq: Once | INTRAVENOUS | Status: AC | PRN
  Administered 2020-11-06: 500 [IU]
  Filled 2020-11-06: qty 5

## 2020-11-06 MED ORDER — SODIUM CHLORIDE 0.9% FLUSH
10.0000 mL | INTRAVENOUS | Status: DC | PRN
  Administered 2020-11-06: 10 mL
  Filled 2020-11-06: qty 10

## 2020-11-06 NOTE — Patient Instructions (Signed)

## 2020-11-07 ENCOUNTER — Other Ambulatory Visit: Payer: Self-pay | Admitting: Family Medicine

## 2020-11-08 DIAGNOSIS — J301 Allergic rhinitis due to pollen: Secondary | ICD-10-CM | POA: Diagnosis not present

## 2020-11-08 DIAGNOSIS — J3081 Allergic rhinitis due to animal (cat) (dog) hair and dander: Secondary | ICD-10-CM | POA: Diagnosis not present

## 2020-11-08 DIAGNOSIS — J3089 Other allergic rhinitis: Secondary | ICD-10-CM | POA: Diagnosis not present

## 2020-11-17 IMAGING — MG MM PLC BREAST LOC DEV 1ST LESION INC*R*
7 series · 7 of 7 positions shown · non-contrast
Comparison: Previous exam(s).

CLINICAL DATA: Biopsy proven invasive ductal carcinoma in the right
subareolar region.

EXAM:
MAMMOGRAPHIC GUIDED RADIOACTIVE SEED LOCALIZATION OF THE RIGHT
BREAST

[R ML]
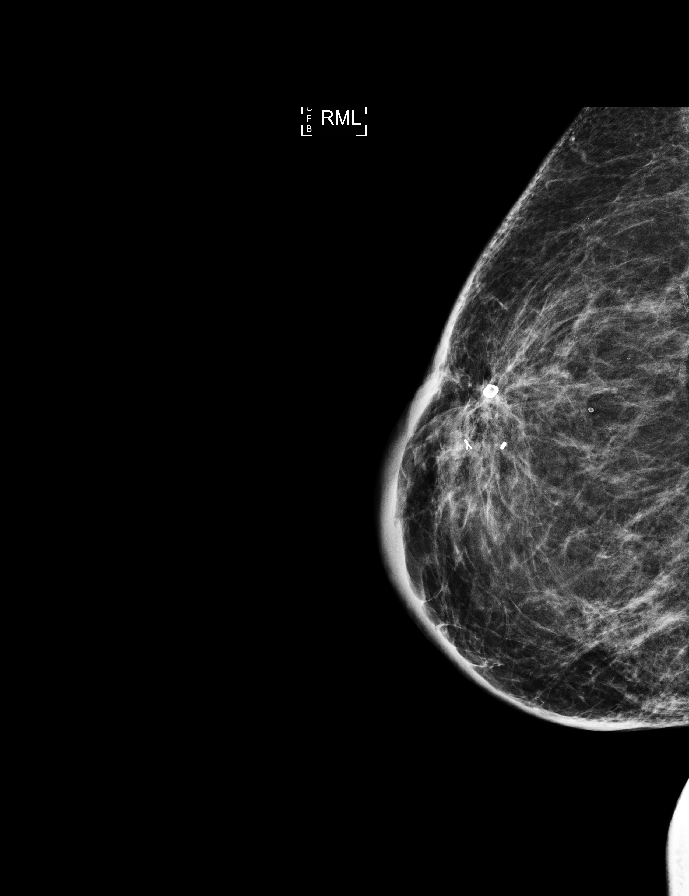

[R LM (1 of 2)]
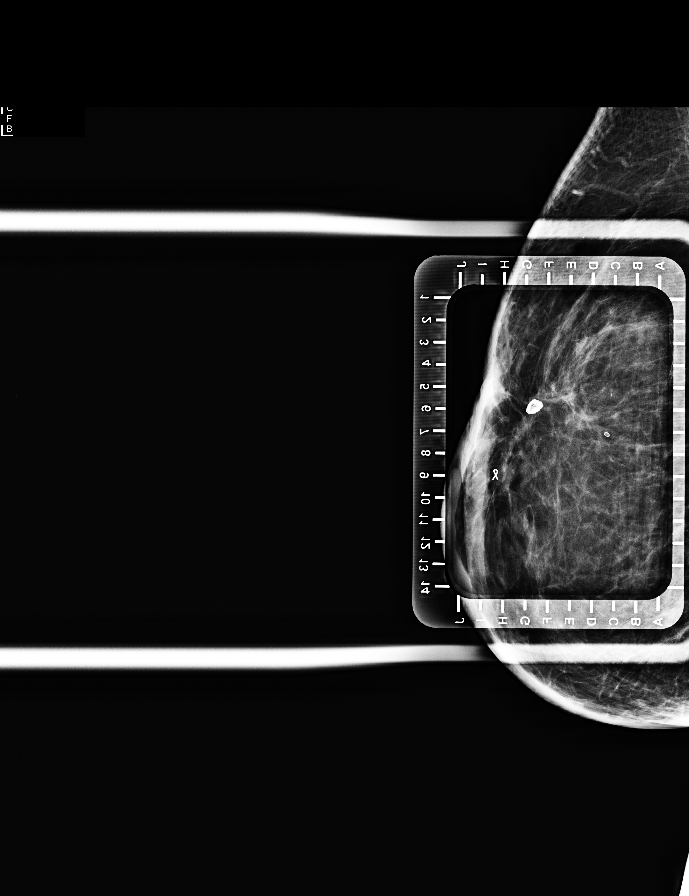

[R CC (1 of 4)]
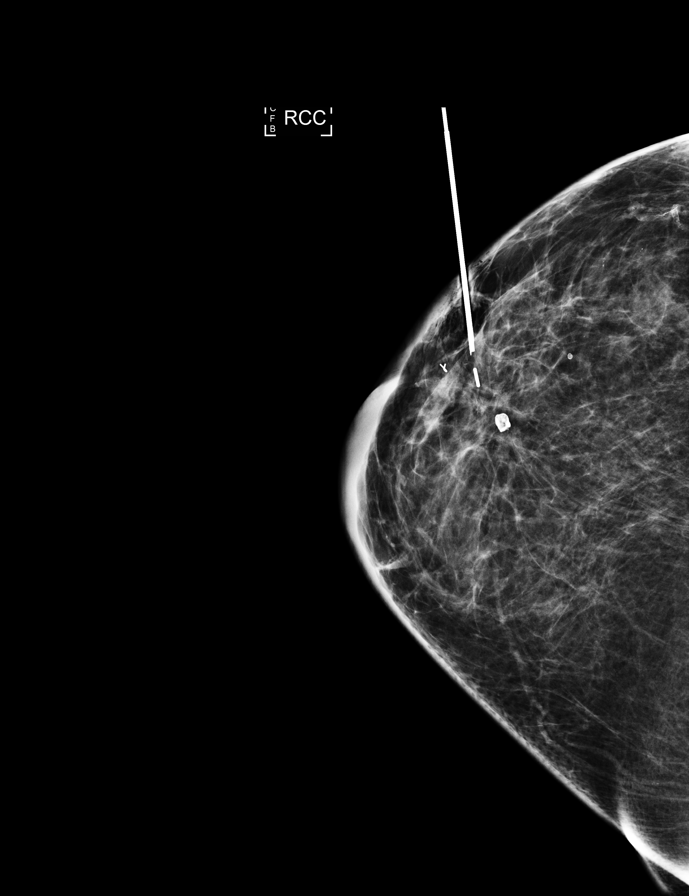

[R LM (2 of 2)]
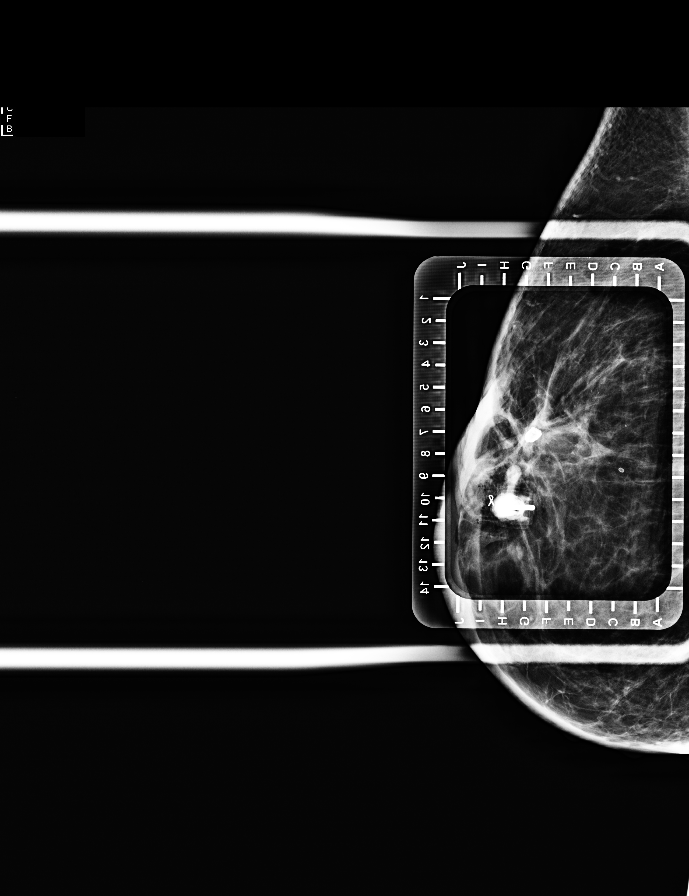

[R CC (2 of 4)]
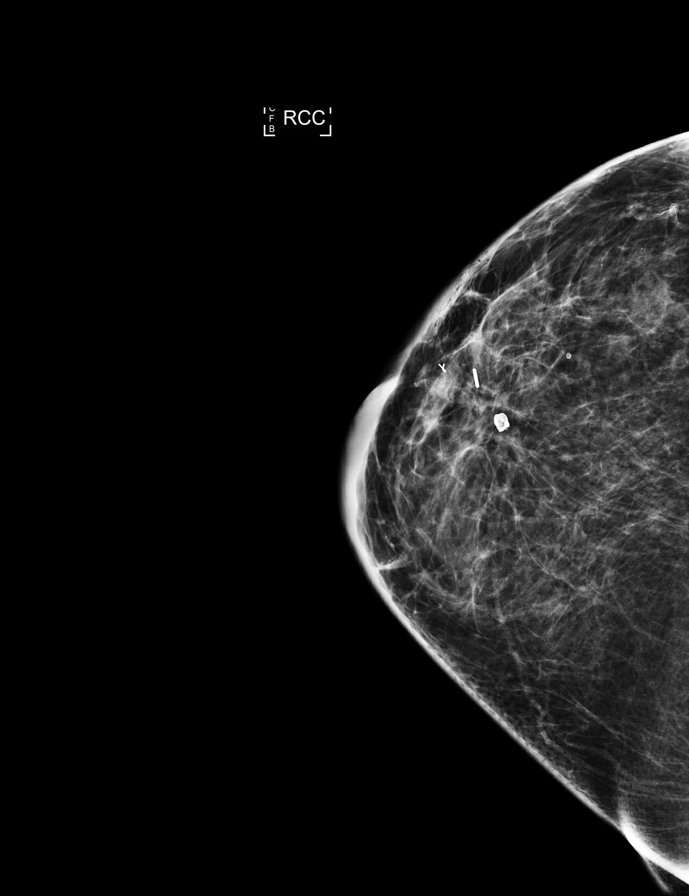

[R CC (3 of 4)]
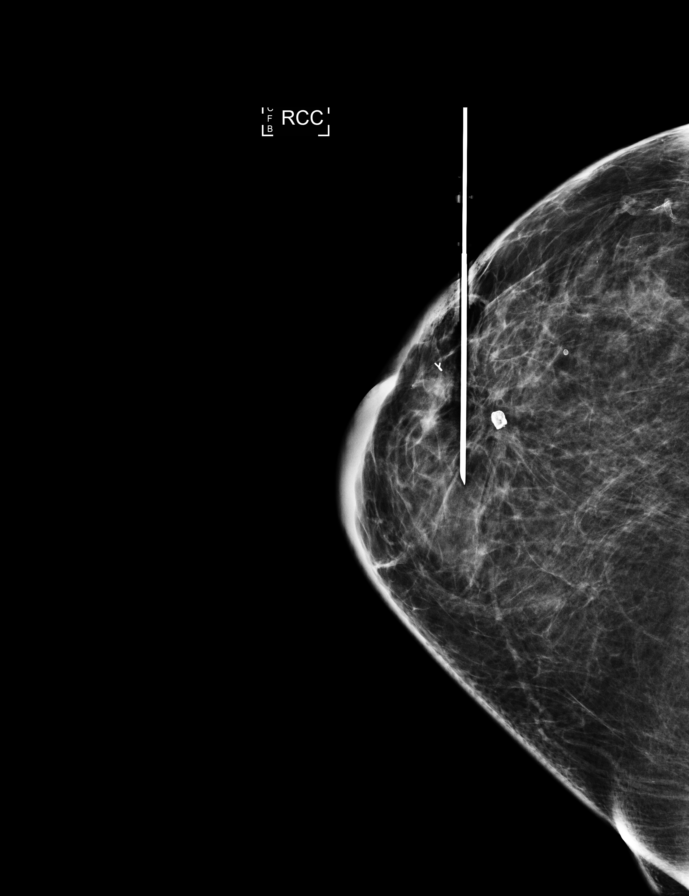

[R CC (4 of 4)]
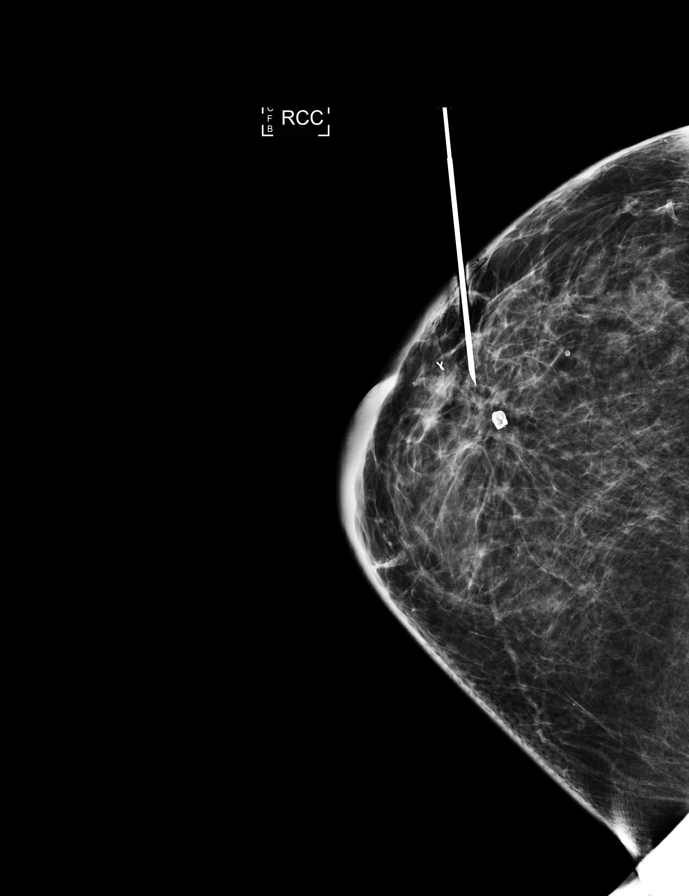

[7 of 7 positions shown; findings below may reference images not displayed]



The usual time-out protocol was performed immediately prior to the
procedure.

Using mammographic guidance, sterile technique, 1% lidocaine and an
F-EY4 radioactive seed, the ribbon shaped clip was localized using a
superior to inferior approach. The follow-up mammogram images
confirm the seed in the expected location and were marked for Dr.
De La Barrera.

Follow-up survey of the patient confirms presence of the radioactive
seed.

Order number of F-EY4 seed:  [id number].

Total activity:  0.252 millicuries reference Date: 01/20/2020

The patient tolerated the procedure well and was released from the
[REDACTED]. She was given instructions regarding seed removal.
IMPRESSION: Radioactive seed localization the right breast. The radioactive seed
is located 8 mm posterior to the ribbon shaped clip. No apparent
complications.

## 2020-11-22 DIAGNOSIS — J3089 Other allergic rhinitis: Secondary | ICD-10-CM | POA: Diagnosis not present

## 2020-11-22 DIAGNOSIS — J3081 Allergic rhinitis due to animal (cat) (dog) hair and dander: Secondary | ICD-10-CM | POA: Diagnosis not present

## 2020-11-22 DIAGNOSIS — J301 Allergic rhinitis due to pollen: Secondary | ICD-10-CM | POA: Diagnosis not present

## 2020-11-27 ENCOUNTER — Other Ambulatory Visit: Payer: Self-pay | Admitting: *Deleted

## 2020-11-27 ENCOUNTER — Inpatient Hospital Stay: Payer: Medicare Other | Attending: Oncology

## 2020-11-27 ENCOUNTER — Inpatient Hospital Stay: Payer: Medicare Other

## 2020-11-27 ENCOUNTER — Other Ambulatory Visit: Payer: Self-pay

## 2020-11-27 VITALS — BP 156/57 | HR 50 | Temp 98.4°F | Resp 18

## 2020-11-27 DIAGNOSIS — C50411 Malignant neoplasm of upper-outer quadrant of right female breast: Secondary | ICD-10-CM

## 2020-11-27 DIAGNOSIS — Z95828 Presence of other vascular implants and grafts: Secondary | ICD-10-CM

## 2020-11-27 DIAGNOSIS — Z79899 Other long term (current) drug therapy: Secondary | ICD-10-CM | POA: Insufficient documentation

## 2020-11-27 DIAGNOSIS — Z5112 Encounter for antineoplastic immunotherapy: Secondary | ICD-10-CM | POA: Insufficient documentation

## 2020-11-27 DIAGNOSIS — C50111 Malignant neoplasm of central portion of right female breast: Secondary | ICD-10-CM | POA: Insufficient documentation

## 2020-11-27 DIAGNOSIS — Z5181 Encounter for therapeutic drug level monitoring: Secondary | ICD-10-CM

## 2020-11-27 LAB — COMPREHENSIVE METABOLIC PANEL
ALT: 11 U/L (ref 0–44)
AST: 13 U/L — ABNORMAL LOW (ref 15–41)
Albumin: 3.4 g/dL — ABNORMAL LOW (ref 3.5–5.0)
Alkaline Phosphatase: 43 U/L (ref 38–126)
Anion gap: 5 (ref 5–15)
BUN: 13 mg/dL (ref 8–23)
CO2: 29 mmol/L (ref 22–32)
Calcium: 8.8 mg/dL — ABNORMAL LOW (ref 8.9–10.3)
Chloride: 105 mmol/L (ref 98–111)
Creatinine, Ser: 0.91 mg/dL (ref 0.44–1.00)
GFR, Estimated: >60 mL/min (ref >60)
Glucose, Bld: 140 mg/dL — ABNORMAL HIGH (ref 70–99)
Potassium: 3.5 mmol/L (ref 3.5–5.1)
Sodium: 139 mmol/L (ref 135–145)
Total Bilirubin: 0.5 mg/dL (ref 0.3–1.2)
Total Protein: 7 g/dL (ref 6.5–8.1)

## 2020-11-27 LAB — CBC WITH DIFFERENTIAL/PLATELET
Abs Immature Granulocytes: 0.01 10*3/uL (ref 0.00–0.07)
Basophils Absolute: 0 10*3/uL (ref 0.0–0.1)
Basophils Relative: 1 %
Eosinophils Absolute: 0.2 10*3/uL (ref 0.0–0.5)
Eosinophils Relative: 4 %
HCT: 38.3 % (ref 36.0–46.0)
Hemoglobin: 12.6 g/dL (ref 12.0–15.0)
Immature Granulocytes: 0 %
Lymphocytes Relative: 29 %
Lymphs Abs: 1.5 10*3/uL (ref 0.7–4.0)
MCH: 29.2 pg (ref 26.0–34.0)
MCHC: 32.9 g/dL (ref 30.0–36.0)
MCV: 88.7 fL (ref 80.0–100.0)
Monocytes Absolute: 0.5 10*3/uL (ref 0.1–1.0)
Monocytes Relative: 8 %
Neutro Abs: 3.1 10*3/uL (ref 1.7–7.7)
Neutrophils Relative %: 58 %
Platelets: 274 10*3/uL (ref 150–400)
RBC: 4.32 MIL/uL (ref 3.87–5.11)
RDW: 13.4 % (ref 11.5–15.5)
WBC: 5.4 10*3/uL (ref 4.0–10.5)
nRBC: 0 % (ref 0.0–0.2)

## 2020-11-27 MED ORDER — ACETAMINOPHEN 325 MG PO TABS
ORAL_TABLET | ORAL | Status: AC
  Filled 2020-11-27: qty 2

## 2020-11-27 MED ORDER — SODIUM CHLORIDE 0.9% FLUSH
10.0000 mL | Freq: Once | INTRAVENOUS | Status: AC
  Administered 2020-11-27: 10 mL
  Filled 2020-11-27: qty 10

## 2020-11-27 MED ORDER — ACETAMINOPHEN 325 MG PO TABS
650.0000 mg | ORAL_TABLET | Freq: Once | ORAL | Status: AC
  Administered 2020-11-27: 650 mg via ORAL

## 2020-11-27 MED ORDER — DIPHENHYDRAMINE HCL 12.5 MG/5ML PO ELIX
12.5000 mg | ORAL_SOLUTION | Freq: Once | ORAL | Status: AC
  Administered 2020-11-27: 12.5 mg via ORAL

## 2020-11-27 MED ORDER — TRASTUZUMAB-DKST CHEMO 150 MG IV SOLR
6.0000 mg/kg | Freq: Once | INTRAVENOUS | Status: AC
  Administered 2020-11-27: 399 mg via INTRAVENOUS
  Filled 2020-11-27: qty 19

## 2020-11-27 MED ORDER — SODIUM CHLORIDE 0.9 % IV SOLN
Freq: Once | INTRAVENOUS | Status: AC
  Filled 2020-11-27: qty 250

## 2020-11-27 MED ORDER — SODIUM CHLORIDE 0.9% FLUSH
10.0000 mL | INTRAVENOUS | Status: DC | PRN
  Administered 2020-11-27: 10 mL
  Filled 2020-11-27: qty 10

## 2020-11-27 MED ORDER — HEPARIN SOD (PORK) LOCK FLUSH 100 UNIT/ML IV SOLN
500.0000 [IU] | Freq: Once | INTRAVENOUS | Status: AC | PRN
Start: 2020-11-27 — End: 2020-11-27
  Administered 2020-11-27: 500 [IU]
  Filled 2020-11-27: qty 5

## 2020-11-27 MED ORDER — DIPHENHYDRAMINE HCL 12.5 MG/5ML PO ELIX
ORAL_SOLUTION | ORAL | Status: AC
  Filled 2020-11-27: qty 5

## 2020-11-27 NOTE — Patient Instructions (Signed)
[facility] °Discharge Instructions for Patients Receiving Chemotherapy ° °Today you received the following chemotherapy agents Trastuzumab ° °To help prevent nausea and vomiting after your treatment, we encourage you to take your nausea medication as directed. °  °If you develop nausea and vomiting that is not controlled by your nausea medication, call the clinic.  ° °BELOW ARE SYMPTOMS THAT SHOULD BE REPORTED IMMEDIATELY: °· *FEVER GREATER THAN 100.5 F °· *CHILLS WITH OR WITHOUT FEVER °· NAUSEA AND VOMITING THAT IS NOT CONTROLLED WITH YOUR NAUSEA MEDICATION °· *UNUSUAL SHORTNESS OF BREATH °· *UNUSUAL BRUISING OR BLEEDING °· TENDERNESS IN MOUTH AND THROAT WITH OR WITHOUT PRESENCE OF ULCERS °· *URINARY PROBLEMS °· *BOWEL PROBLEMS °· UNUSUAL RASH °Items with * indicate a potential emergency and should be followed up as soon as possible. ° °Feel free to call the clinic should you have any questions or concerns. The clinic phone number is [phone]. ° °Please show the CHEMO ALERT CARD at check-in to the Emergency Department and triage nurse. ° ° °

## 2020-12-06 DIAGNOSIS — J301 Allergic rhinitis due to pollen: Secondary | ICD-10-CM | POA: Diagnosis not present

## 2020-12-06 DIAGNOSIS — J3089 Other allergic rhinitis: Secondary | ICD-10-CM | POA: Diagnosis not present

## 2020-12-06 DIAGNOSIS — J3081 Allergic rhinitis due to animal (cat) (dog) hair and dander: Secondary | ICD-10-CM | POA: Diagnosis not present

## 2020-12-14 ENCOUNTER — Ambulatory Visit (HOSPITAL_COMMUNITY)
Admission: RE | Admit: 2020-12-14 | Discharge: 2020-12-14 | Disposition: A | Discharge status: Home / Self Care | Payer: Medicare Other | Source: Ambulatory Visit | Attending: Oncology | Admitting: Oncology

## 2020-12-14 ENCOUNTER — Other Ambulatory Visit: Payer: Self-pay

## 2020-12-14 DIAGNOSIS — Z0189 Encounter for other specified special examinations: Secondary | ICD-10-CM

## 2020-12-14 DIAGNOSIS — I700 Atherosclerosis of aorta: Secondary | ICD-10-CM | POA: Insufficient documentation

## 2020-12-14 DIAGNOSIS — Z5181 Encounter for therapeutic drug level monitoring: Secondary | ICD-10-CM | POA: Diagnosis not present

## 2020-12-14 DIAGNOSIS — C50919 Malignant neoplasm of unspecified site of unspecified female breast: Secondary | ICD-10-CM | POA: Insufficient documentation

## 2020-12-14 DIAGNOSIS — Z79899 Other long term (current) drug therapy: Secondary | ICD-10-CM | POA: Diagnosis not present

## 2020-12-14 LAB — ECHOCARDIOGRAM COMPLETE
Area-P 1/2: 2.76 cm2
S' Lateral: 2.8 cm

## 2020-12-14 NOTE — Progress Notes (Signed)
°  Echocardiogram 2D Echocardiogram has been performed.  Theresa Bradley 12/14/2020, 10:41 AM

## 2020-12-17 NOTE — Progress Notes (Signed)
[name]  [phone] [phone]     ID: Theresa Bradley DOB: [birth year]  MR#: [id number]  HYI#:[id number]  Patient Care Team: [name], MD as PCP - General [name], [name], MD as Consulting Physician (Allergy and Immunology) [name], OD as Referring Physician [name], RN as Oncology Nurse Navigator [name], RN as Oncology Nurse Navigator [name], MD as Consulting Physician (General Surgery) Kalena Mander, [name], MD as Consulting Physician (Oncology) [name], MD as Attending Physician (Radiation Oncology) [name], MD as Referring Physician (Dermatology) [name], MD as Consulting Physician (Cardiology) Chauncey Cruel, MD OTHER MD:  CHIEF COMPLAINT: triple positive breast cancer (s/p right mastectomy)  CURRENT TREATMENT: trastuzumab; tamoxifen   INTERVAL HISTORY: Theresa Bradley returns today for follow up and treatment of her triple positive breast cancer.  She continues on trastuzumab.  She is tolerating this with no side effects that she is aware of. Since her last visit, she underwent repeat echocardiogram on 12/14/2020 showing an ejection fraction of 60-65%, which is in line with her baseline.  She began tamoxifen on 07/31/2020.  Hot flashes can be bad at night and can wake her up.  When she takes the gabapentin however she sleeps right through the night.  She is having mild vaginal wetness and she does use a pad "anyway" which takes care of the problem.  Since her last visit, she has not undergone any additional studies. She will be due for left mammography next month.    REVIEW OF SYSTEMS: Theresa Bradley has come back thicker and currently her than baseline.  She is very pleased with it.  She is having some vaginal dryness issues which she would like to have addressed.  She had questions regarding surveillance scans which we addressed as well.  She exercises chiefly by walking.   She tells me the holidays were wonderful because she got to see everyone in the family but then her 2 sons and one of her daughters-in-law developed Covid.  Thankfully they were vaccinated and did well.   COVID 19 VACCINATION STATUS: Status post Pfizer x2+ booster in October 2021   HISTORY OF CURRENT ILLNESS: From the original intake note:   Theresa Bradley was my patient a little over 11 years ago when she underwent lumpectomy on 05/15/2008 for ductal carcinoma in situ and received radiation therapy under Dr. Valere Dross. 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  More recently, she presented to her PCP with a small palpable right breast lump around 11 o'clock, just superior and lateral to the nipple. She underwent bilateral diagnostic mammography with tomography and right breast ultrasonography at [facility] on 12/26/2019 showing: breast density category B; 6 mm superficial mass in the right breast at 11 o'clock in the retroareolar region; no enlarged adenopathy in right axilla.  Accordingly on 12/30/2019 she proceeded to biopsy of the right breast area in question. The pathology from this procedure ([id number]) showed: invasive ductal carcinoma, grade 2. Prognostic indicators significant for: estrogen receptor, 95% positive and progesterone receptor, 80% positive, both with strong staining intensity. Proliferation marker Ki67 at 20%. HER2 positive by immunohistochemistry (3+).  The patient's subsequent history is as detailed below.   PAST MEDICAL HISTORY: Past Medical History:  Diagnosis Date  . Anxiety   . Breast cancer ([place]) 2009   right  .  Cancer ([place])   . COUGH, CHRONIC 05/10/2010  . Dysrhythmia    occationally "skips a beat"  . Family history of brain cancer   . Family history of breast cancer   . Family history of lung cancer   . Family history of multiple myeloma   . Family  history of skin cancer   . GERD (gastroesophageal reflux disease)   . HYPERTENSION 04/16/2009  . HYPOTHYROIDISM 04/16/2009  . OSTEOARTHRITIS 04/16/2009  . Personal history of radiation therapy   . SENILE LENTIGO 06/10/2010    PAST SURGICAL HISTORY: Past Surgical History:  Procedure Laterality Date  . BREAST EXCISIONAL BIOPSY Right   . BREAST EXCISIONAL BIOPSY Left   . BREAST LUMPECTOMY Right 2009   radiation only  . BREAST LUMPECTOMY WITH RADIOACTIVE SEED AND SENTINEL LYMPH NODE BIOPSY Right 01/31/2020   Procedure: RIGHT BREAST LUMPECTOMY WITH RADIOACTIVE SEED AND SENTINEL LYMPH NODE BIOPSY;  Surgeon: [name], MD;  Location: [facility];  Service: General;  Laterality: Right;  PEC BLOCK  . BREAST SURGERY  2009   X 2, cancer stage 0, lumpectomy  . [place]  . CHOLECYSTECTOMY    . DILATATION & CURETTAGE/HYSTEROSCOPY WITH MYOSURE  11/2015  . HERNIA REPAIR  2009  . JOINT REPLACEMENT  2008   hip  . OPEN SURGICAL REPAIR OF GLUTEAL TENDON Left 07/18/2019   Procedure: Left hip bearing surface revision with gluteal tendon repair;  Surgeon: Gaynelle Arabian, MD;  Location: [facility];  Service: Orthopedics;  Laterality: Left;  2 hrs  . PORTACATH PLACEMENT N/A 03/15/2020   Procedure: INSERTION PORT-A-CATH WITH ULTRASOUND GUIDANCE;  Surgeon: [name], MD;  Location: [facility];  Service: General;  Laterality: N/A;  . SIMPLE MASTECTOMY WITH AXILLARY SENTINEL NODE BIOPSY Right 03/15/2020   Procedure: RIGHT SIMPLE MASTECTOMY;  Surgeon: [name], MD;  Location: [facility];  Service: General;  Laterality: Right;    FAMILY HISTORY: Family History  Problem Relation Age of Onset  . Arthritis Other   . Hypertension Other   . Cancer Other        2 aunts  . Hyperlipidemia Father   . Dementia Father   . Skin cancer Father   . Skin cancer Mother   . Breast cancer Maternal Aunt 63  . Breast cancer Maternal Aunt        dx.  mid-70s  . Lung cancer Paternal Uncle   . Multiple myeloma Paternal Uncle   . Cancer Paternal Aunt        unknown type, dx. >50  . Brain cancer Cousin        dx. 21s, paternal cousin   The patient's father is 24 years old and the patient's mother is 50 years old as of February 2021.  The patient has one brother, no sisters.  A maternal aunt was diagnosed with breast cancer at age 56.  A paternal uncle was diagnosed with lung cancer in his 6s and another paternal uncle with a "blood cancer" in his late 78s.  There is no history of ovarian pancreatic or prostate cancer in the family to her knowledge.   GYNECOLOGIC HISTORY:  No LMP recorded. Patient is postmenopausal. Menarche: 70 years old Age at first live birth: 70 years old G[place] P 2 LMP HRT no  Hysterectomy?  No BSO?  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  Her husband Patrick Jupiter runs and environmental company that for example checks for mold and then remediates.  Son Delfino Lovett, 2 years old, lives in [place] and works as a Clinical biochemist.  He has 2 sons.  The patient's son Catalina Antigua 67 lives in [place] and works in Chief Executive Officer.  He married October 2021.  The patient is Episcopalian    ADVANCED DIRECTIVES: In the absence of any documentation to the contrary, the patient's spouse is their HCPOA.    HEALTH MAINTENANCE: Social History   Tobacco Use  . Smoking status: Never Smoker  . Smokeless tobacco: Never Used  . Tobacco comment: father smoked when she was younger   Vaping Use  . Vaping Use: Never used  Substance Use Topics  . Alcohol use: Yes    Comment: occasionally   . Drug use: No     Colonoscopy: 03/2009/[facility]  PAP: 10/2013, negative  Bone density: 11/2015, T score -1.7   Allergies  Allergen Reactions  . Codeine Sulfate Nausea And Vomiting  . Erythromycin Base Other (See Comments)    Stomach ache  .  Esomeprazole Magnesium     REACTION: GI upset    Current Outpatient Medications  Medication Sig Dispense Refill  . ALPRAZolam (XANAX) 0.25 MG tablet TAKE 1/2 TABLET BY MOUTH AT BEDTIME AS NEEDED INSOMNIA 45 tablet 0  . amLODipine (NORVASC) 5 MG tablet TAKE 1 TABLET BY MOUTH EVERY DAY 90 tablet 3  . BIOTIN 5000 PO Take by mouth.    . Calcium Carbonate-Vitamin D 600-200 MG-UNIT TABS Take 1 tablet by mouth daily.    . citalopram (CELEXA) 20 MG tablet Take 1 tablet (20 mg total) by mouth daily. 90 tablet 3  . docusate sodium (COLACE) 100 MG capsule Take 100 mg by mouth daily as needed for mild constipation.     Marland Kitchen EPIPEN 2-PAK 0.3 MG/0.3ML SOAJ injection Inject 0.3 mg into the muscle as needed for anaphylaxis.     . famotidine (PEPCID) 10 MG tablet Take 10 mg by mouth daily.     . fluocinonide gel (LIDEX) 0.05 %     . gabapentin (NEURONTIN) 300 MG capsule Take 1 capsule (300 mg total) by mouth at bedtime. 90 capsule 4  . levothyroxine (SYNTHROID) 50 MCG tablet TAKE 1 TABLET BY MOUTH EVERY DAY 90 tablet 0  . magic mouthwash SOLN Take 5 mLs by mouth 4 (four) times daily as needed for mouth pain. 240 mL 1  . meloxicam (MOBIC) 15 MG tablet TAKE 1 TABLET BY MOUTH EVERY DAY 90 tablet 1  . mupirocin ointment (BACTROBAN) 2 % APPLY TO AFFECTED AREA EVERY DAY    . omeprazole (PRILOSEC) 20 MG capsule Take 20 mg by mouth daily.    Marland Kitchen tretinoin (RETIN-A) 0.025 % cream Apply topically at bedtime. 45 g 6   No current facility-administered medications for this visit.    OBJECTIVE: White woman in no acute distress  Vitals:   12/18/20 1034  BP: (!) 148/67  Pulse: (!) 59  Resp: 18  Temp: (!) 97.2 F (36.2 C)  SpO2: 100%     Body mass index is 25.88 kg/m.   Wt Readings from Last 3 Encounters:  12/18/20 150 lb 12.8 oz (68.4 kg)  11/06/20 149 lb 12 oz (67.9 kg)  10/16/20 149 lb 12 oz (67.9 kg)     ECOG FS:1 - Symptomatic but completely ambulatory  Sclerae unicteric, EOMs intact Wearing a mask No  cervical or supraclavicular adenopathy Lungs no rales  or rhonchi Heart regular rate and rhythm Abd soft, nontender, positive bowel sounds MSK no focal spinal tenderness, no upper extremity lymphedema Neuro: nonfocal, well oriented, appropriate affect Breasts: The right breast is status post mastectomy.  There is no evidence of local recurrence.  The left breast is benign.  Both axillae are benign   LAB RESULTS:  CMP     Component Value Date/Time   NA 139 11/27/2020 1200   K 3.5 11/27/2020 1200   CL 105 11/27/2020 1200   CO2 29 11/27/2020 1200   GLUCOSE 140 (H) 11/27/2020 1200   BUN 13 11/27/2020 1200   CREATININE 0.91 11/27/2020 1200   CREATININE 0.81 01/11/2020 1209   CALCIUM 8.8 (L) 11/27/2020 1200   PROT 7.0 11/27/2020 1200   ALBUMIN 3.4 (L) 11/27/2020 1200   AST 13 (L) 11/27/2020 1200   AST 14 (L) 01/11/2020 1209   ALT 11 11/27/2020 1200   ALT 13 01/11/2020 1209   ALKPHOS 43 11/27/2020 1200   BILITOT 0.5 11/27/2020 1200   BILITOT 0.5 01/11/2020 1209   GFRNONAA >60 11/27/2020 1200   GFRNONAA >60 01/11/2020 1209   GFRAA >60 08/14/2020 1227   GFRAA >60 01/11/2020 1209    No results found for: TOTALPROTELP, ALBUMINELP, A1GS, A2GS, BETS, BETA2SER, GAMS, MSPIKE, SPEI  Lab Results  Component Value Date   WBC 4.8 12/18/2020   NEUTROABS 2.9 12/18/2020   HGB 12.6 12/18/2020   HCT 38.7 12/18/2020   MCV 90.4 12/18/2020   PLT 284 12/18/2020    No results found for: LABCA2  No components found for: [id number]  No results for input(s): INR in the last 168 hours.  No results found for: LABCA2  No results found for: BWL893  No results found for: TDS287  No results found for: GOT157  No results found for: CA2729  No components found for: HGQUANT  No results found for: CEA1 / No results found for: CEA1   No results found for: AFPTUMOR  No results found for: CHROMOGRNA  No results found for: KPAFRELGTCHN, LAMBDASER, KAPLAMBRATIO (kappa/lambda light  chains)  No results found for: HGBA, HGBA2QUANT, HGBFQUANT, HGBSQUAN (Hemoglobinopathy evaluation)   No results found for: LDH  No results found for: IRON, TIBC, IRONPCTSAT (Iron and TIBC)  No results found for: FERRITIN  Urinalysis    Component Value Date/Time   COLORURINE yellow 05/24/2010 0855   APPEARANCEUR Clear 05/24/2010 0855   LABSPEC 1.015 05/24/2010 0855   PHURINE 6.0 05/24/2010 0855   GLUCOSEU NEGATIVE 09/22/2007 1110   HGBUR negative 05/24/2010 0855   BILIRUBINUR n 06/29/2019 1533   KETONESUR NEGATIVE 09/22/2007 1110   PROTEINUR Negative 06/29/2019 1533   PROTEINUR NEGATIVE 09/22/2007 1110   UROBILINOGEN 0.2 06/29/2019 1533   UROBILINOGEN 0.2 05/24/2010 0855   NITRITE n 06/29/2019 1533   NITRITE negative 05/24/2010 0855   LEUKOCYTESUR Negative 06/29/2019 1533    STUDIES: ECHOCARDIOGRAM COMPLETE  Result Date: 12/14/2020    ECHOCARDIOGRAM REPORT   Patient Name:   TAVIA STAVE Date of Exam: 12/14/2020 Medical Rec #:  [id number]       Height:       64.0 in Accession #:    [id number]      Weight:       149.7 lb Date of Birth:  14-Jun-1951        BSA:          1.730 m Patient Age:    50 years        BP:  156/57 mmHg Patient Gender: F               HR:           50 bpm. Exam Location:  Outpatient Procedure: 2D Echo, Cardiac Doppler, Color Doppler and Strain Analysis Indications:    Chemo evaluation  History:        Patient has prior history of Echocardiogram examinations, most                 recent 08/28/2020. Breast cancer, chemo.  Sonographer:    Dustin Flock Referring Phys: [place]  1. Left ventricular ejection fraction, by estimation, is 60 to 65%. The left ventricle has normal function. The left ventricle has no regional wall motion abnormalities. Left ventricular diastolic parameters are consistent with Grade I diastolic dysfunction (impaired relaxation). The average left ventricular global longitudinal strain is -16.6 %. The  global longitudinal strain is normal.  2. Right ventricular systolic function is normal. The right ventricular size is normal. There is normal pulmonary artery systolic pressure. The estimated right ventricular systolic pressure is 53.6 mmHg.  3. The mitral valve is normal in structure. No evidence of mitral valve regurgitation. No evidence of mitral stenosis.  4. The aortic valve is tricuspid. There is mild calcification of the aortic valve. There is mild thickening of the aortic valve. Aortic valve regurgitation is not visualized. Mild aortic valve sclerosis is present, with no evidence of aortic valve stenosis.  5. The inferior vena cava is normal in size with greater than 50% respiratory variability, suggesting right atrial pressure of 3 mmHg. Comparison(s): No significant change from prior study. Prior images reviewed side by side. ECHO GLS on 01/17/20 was -15.3%. FINDINGS  Left Ventricle: Left ventricular ejection fraction, by estimation, is 60 to 65%. The left ventricle has normal function. The left ventricle has no regional wall motion abnormalities. The average left ventricular global longitudinal strain is -16.6 %. The global longitudinal strain is normal. The left ventricular internal cavity size was normal in size. There is no left ventricular hypertrophy. Left ventricular diastolic parameters are consistent with Grade I diastolic dysfunction (impaired relaxation). Right Ventricle: The right ventricular size is normal. No increase in right ventricular wall thickness. Right ventricular systolic function is normal. There is normal pulmonary artery systolic pressure. The tricuspid regurgitant velocity is 2.36 m/s, and  with an assumed right atrial pressure of 3 mmHg, the estimated right ventricular systolic pressure is 14.4 mmHg. Left Atrium: Left atrial size was normal in size. Right Atrium: Right atrial size was normal in size. Pericardium: There is no evidence of pericardial effusion. Mitral Valve: The  mitral valve is normal in structure. Mild mitral annular calcification. No evidence of mitral valve regurgitation. No evidence of mitral valve stenosis. Tricuspid Valve: The tricuspid valve is normal in structure. Tricuspid valve regurgitation is trivial. No evidence of tricuspid stenosis. Aortic Valve: The aortic valve is tricuspid. There is mild calcification of the aortic valve. There is mild thickening of the aortic valve. Aortic valve regurgitation is not visualized. Mild aortic valve sclerosis is present, with no evidence of aortic valve stenosis. Pulmonic Valve: The pulmonic valve was normal in structure. Pulmonic valve regurgitation is trivial. No evidence of pulmonic stenosis. Aorta: The aortic root is normal in size and structure. Venous: The inferior vena cava is normal in size with greater than 50% respiratory variability, suggesting right atrial pressure of 3 mmHg. IAS/Shunts: No atrial level shunt detected by color flow Doppler.  LEFT  VENTRICLE PLAX 2D LVIDd:         4.20 cm  Diastology LVIDs:         2.80 cm  LV e' medial:    8.05 cm/s LV PW:         1.10 cm  LV E/e' medial:  6.3 LV IVS:        1.10 cm  LV e' lateral:   6.20 cm/s LVOT diam:     2.20 cm  LV E/e' lateral: 8.2 LV SV:         52 LV SV Index:   30       2D Longitudinal Strain LVOT Area:     3.80 cm 2D Strain GLS Avg:     -16.6 %  RIGHT VENTRICLE RV Basal diam:  3.70 cm RV S prime:     11.30 cm/s TAPSE (M-mode): 2.2 cm LEFT ATRIUM             Index       RIGHT ATRIUM           Index LA diam:        3.60 cm 2.08 cm/m  RA Area:     12.90 cm LA Vol (A2C):   36.0 ml 20.81 ml/m RA Volume:   31.70 ml  18.32 ml/m LA Vol (A4C):   42.5 ml 24.57 ml/m LA Biplane Vol: 40.5 ml 23.41 ml/m  AORTIC VALVE LVOT Vmax:   61.40 cm/s LVOT Vmean:  46.200 cm/s LVOT VTI:    0.138 m  AORTA Ao Root diam: 2.80 cm MITRAL VALVE               TRICUSPID VALVE MV Area (PHT): 2.76 cm    TR Peak grad:   22.3 mmHg MV Decel Time: 275 msec    TR Vmax:        236.00 cm/s  MV E velocity: 51.00 cm/s MV A velocity: 56.10 cm/s  SHUNTS MV E/A ratio:  0.91        Systemic VTI:  0.14 m                            Systemic Diam: 2.20 cm Candee Furbish MD Electronically signed by Candee Furbish MD Signature Date/Time: 12/14/2020/11:14:08 AM    Final      ELIGIBLE FOR AVAILABLE RESEARCH PROTOCOL: no  ASSESSMENT: 70 y.o. [place] woman status post right breast periareolar biopsy 12/30/2019 for a clinical T1a N0, stage I invasive ductal carcinoma, grade 2, estrogen and progesterone receptor positive, HER-2 amplified, with an MIB-one of 20%.  (1) history of prior right lumpectomy June 2009 for ductal carcinoma in situ  (a) status post adjuvant radiation  (b) did not receive antiestrogens  (2) status post repeat right lumpectomy and sentinel lymph node sampling 01/31/2020 for a pT1c pN0, stage IA invasive ductal carcinoma, grade 2, with positive margins  (a) total one sentinel node removed  (3) s/p right mastectomy 03/15/2020 showing residual invasive ductal carcinoma measuring 0.9 cm, but negative margins (added to prior 1.1 lumpectomy, still T1 N0 or stage IA)  (a) one additional axillary node was removed (total 2 right axillary nodes removed)  (3) adjuvant chemotherapy with paclitaxel and trastuzumab weekly x 12 started 04/24/2020, completing 10 doses 06/26/2020   (a) final 2 doses omitted secondary to peripheral neuropathy  (4) trastuzumab to be continued every 21 days to total one year (through May 2022).  (a) echo 01/17/2020 shows an  ejection fraction in the 60-65% range  (b) echo 05/30/2020 shows an ejection fraction of 60-65%.  (c) echo 08/28/2020 shows an ejection fraction in the 55-60% range  (d) echo 12/14/2020 shows an ejection fraction in the 60-65% range.  (5) started tamoxifen 07/31/2020  (a) Bone density in 11/2015 shows osteopenia with T score of -1.7.  (6) genetics testing 02/01/2020 through the [facility] STAT panel found no deleterious mutations  then ATM, BRCA1, BRCA2, CDH1, CHEK2, PALB2, PTEN, STK11 and TP53.    PLAN: Theresa Bradley will soon be a year out from definitive surgery for her breast cancer with no evidence of disease recurrence.  This is  Favorable.  She is tolerating trastuzumab well and her echocardiogram remains excellent.  She will have her next echo in mid April.  She will finish the Herceptin 04/02/2021.  She is doing okay with tamoxifen.  It does wake her up at night if she does not take the gabapentin.  I reassured her that taking the gabapentin every night is very safe, not habit-forming, and she may stop it anytime she wishes.  She is having vaginal dryness issues.  We are going to try Estring and see if that works for her  Otherwise she will see me again with her April Herceptin dose.  She knows to call for any other issues that may develop before then  Total encounter time 25 minutes.Sarajane Jews C. Glennice Marcos, MD 12/18/20 11:02 AM Medical Oncology and Hematology [facility] [place], [place] [address] Tel. [phone]    Fax. [phone]   I, Wilburn Mylar, am acting as scribe for Dr. [name]. Jullien Granquist.  I, Lurline Del MD, have reviewed the above documentation for accuracy and completeness, and I agree with the above.   *Total Encounter Time as defined by the Centers for Medicare and Medicaid Services includes, in addition to the face-to-face time of a patient visit (documented in the note above) non-face-to-face time: obtaining and reviewing outside history, ordering and reviewing medications, tests or procedures, care coordination (communications with other health care professionals or caregivers) and documentation in the medical record.

## 2020-12-18 ENCOUNTER — Inpatient Hospital Stay: Payer: Medicare Other

## 2020-12-18 ENCOUNTER — Inpatient Hospital Stay: Payer: Medicare Other | Admitting: Oncology

## 2020-12-18 ENCOUNTER — Other Ambulatory Visit: Payer: Self-pay

## 2020-12-18 ENCOUNTER — Encounter: Payer: Self-pay | Admitting: *Deleted

## 2020-12-18 VITALS — BP 148/67 | HR 59 | Temp 97.2°F | Resp 18 | Ht 64.0 in | Wt 150.8 lb

## 2020-12-18 DIAGNOSIS — C439 Malignant melanoma of skin, unspecified: Secondary | ICD-10-CM | POA: Diagnosis not present

## 2020-12-18 DIAGNOSIS — Z79899 Other long term (current) drug therapy: Secondary | ICD-10-CM | POA: Diagnosis not present

## 2020-12-18 DIAGNOSIS — Z170 Estrogen receptor positive status [ER+]: Secondary | ICD-10-CM | POA: Diagnosis not present

## 2020-12-18 DIAGNOSIS — C50111 Malignant neoplasm of central portion of right female breast: Secondary | ICD-10-CM | POA: Diagnosis not present

## 2020-12-18 DIAGNOSIS — C50411 Malignant neoplasm of upper-outer quadrant of right female breast: Secondary | ICD-10-CM

## 2020-12-18 DIAGNOSIS — Z5112 Encounter for antineoplastic immunotherapy: Secondary | ICD-10-CM | POA: Diagnosis not present

## 2020-12-18 LAB — CBC WITH DIFFERENTIAL/PLATELET
Abs Immature Granulocytes: 0 10*3/uL (ref 0.00–0.07)
Basophils Absolute: 0 10*3/uL (ref 0.0–0.1)
Basophils Relative: 1 %
Eosinophils Absolute: 0.2 10*3/uL (ref 0.0–0.5)
Eosinophils Relative: 4 %
HCT: 38.7 % (ref 36.0–46.0)
Hemoglobin: 12.6 g/dL (ref 12.0–15.0)
Immature Granulocytes: 0 %
Lymphocytes Relative: 27 %
Lymphs Abs: 1.3 10*3/uL (ref 0.7–4.0)
MCH: 29.4 pg (ref 26.0–34.0)
MCHC: 32.6 g/dL (ref 30.0–36.0)
MCV: 90.4 fL (ref 80.0–100.0)
Monocytes Absolute: 0.4 10*3/uL (ref 0.1–1.0)
Monocytes Relative: 8 %
Neutro Abs: 2.9 10*3/uL (ref 1.7–7.7)
Neutrophils Relative %: 60 %
Platelets: 284 10*3/uL (ref 150–400)
RBC: 4.28 MIL/uL (ref 3.87–5.11)
RDW: 13.2 % (ref 11.5–15.5)
WBC: 4.8 10*3/uL (ref 4.0–10.5)
nRBC: 0 % (ref 0.0–0.2)

## 2020-12-18 LAB — COMPREHENSIVE METABOLIC PANEL
ALT: 10 U/L (ref 0–44)
AST: 13 U/L — ABNORMAL LOW (ref 15–41)
Albumin: 3.4 g/dL — ABNORMAL LOW (ref 3.5–5.0)
Alkaline Phosphatase: 39 U/L (ref 38–126)
Anion gap: 7 (ref 5–15)
BUN: 14 mg/dL (ref 8–23)
CO2: 27 mmol/L (ref 22–32)
Calcium: 8.6 mg/dL — ABNORMAL LOW (ref 8.9–10.3)
Chloride: 105 mmol/L (ref 98–111)
Creatinine, Ser: 0.94 mg/dL (ref 0.44–1.00)
GFR, Estimated: >60 mL/min (ref >60)
Glucose, Bld: 163 mg/dL — ABNORMAL HIGH (ref 70–99)
Potassium: 3.5 mmol/L (ref 3.5–5.1)
Sodium: 139 mmol/L (ref 135–145)
Total Bilirubin: 0.5 mg/dL (ref 0.3–1.2)
Total Protein: 7 g/dL (ref 6.5–8.1)

## 2020-12-18 MED ORDER — DIPHENHYDRAMINE HCL 12.5 MG/5ML PO ELIX
12.5000 mg | ORAL_SOLUTION | Freq: Once | ORAL | Status: AC
Start: 2020-12-18 — End: 2020-12-18
  Administered 2020-12-18: 12.5 mg via ORAL

## 2020-12-18 MED ORDER — SODIUM CHLORIDE 0.9 % IV SOLN
Freq: Once | INTRAVENOUS | Status: AC
  Filled 2020-12-18: qty 250

## 2020-12-18 MED ORDER — TRASTUZUMAB-DKST CHEMO 150 MG IV SOLR
6.0000 mg/kg | Freq: Once | INTRAVENOUS | Status: AC
  Administered 2020-12-18: 399 mg via INTRAVENOUS
  Filled 2020-12-18: qty 19

## 2020-12-18 MED ORDER — ESTRING 2 MG VA RING: 2.0000 mg | VAGINAL_RING | VAGINAL | 12 refills | Status: DC

## 2020-12-18 MED ORDER — SODIUM CHLORIDE 0.9% FLUSH
10.0000 mL | INTRAVENOUS | Status: DC | PRN
  Administered 2020-12-18: 10 mL
  Filled 2020-12-18: qty 10

## 2020-12-18 MED ORDER — HEPARIN SOD (PORK) LOCK FLUSH 100 UNIT/ML IV SOLN
500.0000 [IU] | Freq: Once | INTRAVENOUS | Status: AC | PRN
  Administered 2020-12-18: 500 [IU]
  Filled 2020-12-18: qty 5

## 2020-12-18 MED ORDER — ACETAMINOPHEN 325 MG PO TABS
650.0000 mg | ORAL_TABLET | Freq: Once | ORAL | Status: AC
  Administered 2020-12-18: 650 mg via ORAL

## 2020-12-18 MED ORDER — ESTRING 2 MG VA RING
2.0000 mg | VAGINAL_RING | VAGINAL | 12 refills | Status: DC
Start: 2020-12-18 — End: 2021-02-18

## 2020-12-18 NOTE — Patient Instructions (Signed)
[facility] Discharge Instructions for Patients Receiving Chemotherapy  Today you received the following chemotherapy agents trastuzumab.  To help prevent nausea and vomiting after your treatment, we encourage you to take your nausea medication as directed.    If you develop nausea and vomiting that is not controlled by your nausea medication, call the clinic.   BELOW ARE SYMPTOMS THAT SHOULD BE REPORTED IMMEDIATELY:  *FEVER GREATER THAN 100.5 F  *CHILLS WITH OR WITHOUT FEVER  NAUSEA AND VOMITING THAT IS NOT CONTROLLED WITH YOUR NAUSEA MEDICATION  *UNUSUAL SHORTNESS OF BREATH  *UNUSUAL BRUISING OR BLEEDING  TENDERNESS IN MOUTH AND THROAT WITH OR WITHOUT PRESENCE OF ULCERS  *URINARY PROBLEMS  *BOWEL PROBLEMS  UNUSUAL RASH Items with * indicate a potential emergency and should be followed up as soon as possible.  Feel free to call the clinic should you have any questions or concerns. The clinic phone number is [phone].  Please show the CHEMO ALERT CARD at check-in to the Emergency Department and triage nurse.   

## 2020-12-18 NOTE — Addendum Note (Signed)
Addended by: Chauncey Cruel on: 12/18/2020 05:39 PM   Modules accepted: Orders

## 2020-12-20 DIAGNOSIS — J301 Allergic rhinitis due to pollen: Secondary | ICD-10-CM | POA: Diagnosis not present

## 2020-12-20 DIAGNOSIS — J3081 Allergic rhinitis due to animal (cat) (dog) hair and dander: Secondary | ICD-10-CM | POA: Diagnosis not present

## 2020-12-20 DIAGNOSIS — J3089 Other allergic rhinitis: Secondary | ICD-10-CM | POA: Diagnosis not present

## 2020-12-24 ENCOUNTER — Encounter: Payer: Self-pay | Admitting: *Deleted

## 2020-12-24 DIAGNOSIS — J301 Allergic rhinitis due to pollen: Secondary | ICD-10-CM | POA: Diagnosis not present

## 2020-12-24 DIAGNOSIS — J3081 Allergic rhinitis due to animal (cat) (dog) hair and dander: Secondary | ICD-10-CM | POA: Diagnosis not present

## 2020-12-24 DIAGNOSIS — J3089 Other allergic rhinitis: Secondary | ICD-10-CM | POA: Diagnosis not present

## 2020-12-26 ENCOUNTER — Other Ambulatory Visit: Payer: Self-pay | Admitting: Family Medicine

## 2020-12-26 DIAGNOSIS — H2513 Age-related nuclear cataract, bilateral: Secondary | ICD-10-CM | POA: Diagnosis not present

## 2020-12-26 DIAGNOSIS — H40013 Open angle with borderline findings, low risk, bilateral: Secondary | ICD-10-CM | POA: Diagnosis not present

## 2020-12-26 DIAGNOSIS — H43812 Vitreous degeneration, left eye: Secondary | ICD-10-CM | POA: Diagnosis not present

## 2020-12-26 DIAGNOSIS — H5203 Hypermetropia, bilateral: Secondary | ICD-10-CM | POA: Diagnosis not present

## 2020-12-26 DIAGNOSIS — J3089 Other allergic rhinitis: Secondary | ICD-10-CM | POA: Diagnosis not present

## 2020-12-26 DIAGNOSIS — J3081 Allergic rhinitis due to animal (cat) (dog) hair and dander: Secondary | ICD-10-CM | POA: Diagnosis not present

## 2020-12-26 DIAGNOSIS — J301 Allergic rhinitis due to pollen: Secondary | ICD-10-CM | POA: Diagnosis not present

## 2021-01-01 DIAGNOSIS — J3089 Other allergic rhinitis: Secondary | ICD-10-CM | POA: Diagnosis not present

## 2021-01-01 DIAGNOSIS — J301 Allergic rhinitis due to pollen: Secondary | ICD-10-CM | POA: Diagnosis not present

## 2021-01-01 DIAGNOSIS — J3081 Allergic rhinitis due to animal (cat) (dog) hair and dander: Secondary | ICD-10-CM | POA: Diagnosis not present

## 2021-01-04 DIAGNOSIS — J3089 Other allergic rhinitis: Secondary | ICD-10-CM | POA: Diagnosis not present

## 2021-01-04 DIAGNOSIS — J3081 Allergic rhinitis due to animal (cat) (dog) hair and dander: Secondary | ICD-10-CM | POA: Diagnosis not present

## 2021-01-04 DIAGNOSIS — J301 Allergic rhinitis due to pollen: Secondary | ICD-10-CM | POA: Diagnosis not present

## 2021-01-08 ENCOUNTER — Other Ambulatory Visit: Payer: Self-pay

## 2021-01-08 ENCOUNTER — Inpatient Hospital Stay: Payer: Medicare Other

## 2021-01-08 ENCOUNTER — Inpatient Hospital Stay: Payer: Medicare Other | Attending: Oncology

## 2021-01-08 VITALS — BP 156/68 | HR 51 | Temp 98.0°F | Resp 18 | Ht 64.0 in | Wt 153.5 lb

## 2021-01-08 DIAGNOSIS — Z79899 Other long term (current) drug therapy: Secondary | ICD-10-CM | POA: Insufficient documentation

## 2021-01-08 DIAGNOSIS — C50411 Malignant neoplasm of upper-outer quadrant of right female breast: Secondary | ICD-10-CM

## 2021-01-08 DIAGNOSIS — Z5112 Encounter for antineoplastic immunotherapy: Secondary | ICD-10-CM | POA: Insufficient documentation

## 2021-01-08 DIAGNOSIS — Z170 Estrogen receptor positive status [ER+]: Secondary | ICD-10-CM

## 2021-01-08 DIAGNOSIS — Z95828 Presence of other vascular implants and grafts: Secondary | ICD-10-CM

## 2021-01-08 DIAGNOSIS — C50111 Malignant neoplasm of central portion of right female breast: Secondary | ICD-10-CM | POA: Insufficient documentation

## 2021-01-08 LAB — CBC WITH DIFFERENTIAL/PLATELET
Abs Immature Granulocytes: 0.01 10*3/uL (ref 0.00–0.07)
Basophils Absolute: 0 10*3/uL (ref 0.0–0.1)
Basophils Relative: 1 %
Eosinophils Absolute: 0.3 10*3/uL (ref 0.0–0.5)
Eosinophils Relative: 5 %
HCT: 37.3 % (ref 36.0–46.0)
Hemoglobin: 12.3 g/dL (ref 12.0–15.0)
Immature Granulocytes: 0 %
Lymphocytes Relative: 28 %
Lymphs Abs: 1.5 10*3/uL (ref 0.7–4.0)
MCH: 29.1 pg (ref 26.0–34.0)
MCHC: 33 g/dL (ref 30.0–36.0)
MCV: 88.4 fL (ref 80.0–100.0)
Monocytes Absolute: 0.5 10*3/uL (ref 0.1–1.0)
Monocytes Relative: 10 %
Neutro Abs: 3 10*3/uL (ref 1.7–7.7)
Neutrophils Relative %: 56 %
Platelets: 265 10*3/uL (ref 150–400)
RBC: 4.22 MIL/uL (ref 3.87–5.11)
RDW: 12.7 % (ref 11.5–15.5)
WBC: 5.2 10*3/uL (ref 4.0–10.5)
nRBC: 0 % (ref 0.0–0.2)

## 2021-01-08 LAB — COMPREHENSIVE METABOLIC PANEL
ALT: 9 U/L (ref 0–44)
AST: 13 U/L — ABNORMAL LOW (ref 15–41)
Albumin: 3.4 g/dL — ABNORMAL LOW (ref 3.5–5.0)
Alkaline Phosphatase: 40 U/L (ref 38–126)
Anion gap: 6 (ref 5–15)
BUN: 13 mg/dL (ref 8–23)
CO2: 28 mmol/L (ref 22–32)
Calcium: 8.8 mg/dL — ABNORMAL LOW (ref 8.9–10.3)
Chloride: 105 mmol/L (ref 98–111)
Creatinine, Ser: 0.94 mg/dL (ref 0.44–1.00)
GFR, Estimated: >60 mL/min (ref >60)
Glucose, Bld: 156 mg/dL — ABNORMAL HIGH (ref 70–99)
Potassium: 3.7 mmol/L (ref 3.5–5.1)
Sodium: 139 mmol/L (ref 135–145)
Total Bilirubin: 0.4 mg/dL (ref 0.3–1.2)
Total Protein: 6.9 g/dL (ref 6.5–8.1)

## 2021-01-08 MED ORDER — DIPHENHYDRAMINE HCL 12.5 MG/5ML PO ELIX
12.5000 mg | ORAL_SOLUTION | Freq: Once | ORAL | Status: AC
  Administered 2021-01-08: 12.5 mg via ORAL

## 2021-01-08 MED ORDER — DIPHENHYDRAMINE HCL 12.5 MG/5ML PO ELIX
ORAL_SOLUTION | ORAL | Status: AC
  Filled 2021-01-08: qty 5

## 2021-01-08 MED ORDER — SODIUM CHLORIDE 0.9% FLUSH
10.0000 mL | INTRAVENOUS | Status: DC | PRN
  Administered 2021-01-08: 10 mL
  Filled 2021-01-08: qty 10

## 2021-01-08 MED ORDER — HEPARIN SOD (PORK) LOCK FLUSH 100 UNIT/ML IV SOLN
500.0000 [IU] | Freq: Once | INTRAVENOUS | Status: AC | PRN
  Administered 2021-01-08: 500 [IU]
  Filled 2021-01-08: qty 5

## 2021-01-08 MED ORDER — SODIUM CHLORIDE 0.9% FLUSH
10.0000 mL | Freq: Once | INTRAVENOUS | Status: AC
  Administered 2021-01-08: 10 mL
  Filled 2021-01-08: qty 10

## 2021-01-08 MED ORDER — ACETAMINOPHEN 325 MG PO TABS
650.0000 mg | ORAL_TABLET | Freq: Once | ORAL | Status: AC
  Administered 2021-01-08: 650 mg via ORAL

## 2021-01-08 MED ORDER — TRASTUZUMAB-DKST CHEMO 150 MG IV SOLR
6.0000 mg/kg | Freq: Once | INTRAVENOUS | Status: AC
  Administered 2021-01-08: 399 mg via INTRAVENOUS
  Filled 2021-01-08: qty 19

## 2021-01-08 MED ORDER — ACETAMINOPHEN 325 MG PO TABS
ORAL_TABLET | ORAL | Status: AC
  Filled 2021-01-08: qty 2

## 2021-01-08 MED ORDER — SODIUM CHLORIDE 0.9 % IV SOLN
Freq: Once | INTRAVENOUS | Status: AC
  Filled 2021-01-08: qty 250

## 2021-01-08 NOTE — Patient Instructions (Signed)
[facility] Discharge Instructions for Patients Receiving Chemotherapy  Today you received the following chemotherapy agents: Trastuzumab (Herceptin).  To help prevent nausea and vomiting after your treatment, we encourage you to take your nausea medication as prescribed. If you develop nausea and vomiting that is not controlled by your nausea medication, call the clinic.   BELOW ARE SYMPTOMS THAT SHOULD BE REPORTED IMMEDIATELY:  *FEVER GREATER THAN 100.5 F  *CHILLS WITH OR WITHOUT FEVER  NAUSEA AND VOMITING THAT IS NOT CONTROLLED WITH YOUR NAUSEA MEDICATION  *UNUSUAL SHORTNESS OF BREATH  *UNUSUAL BRUISING OR BLEEDING  TENDERNESS IN MOUTH AND THROAT WITH OR WITHOUT PRESENCE OF ULCERS  *URINARY PROBLEMS  *BOWEL PROBLEMS  UNUSUAL RASH Items with * indicate a potential emergency and should be followed up as soon as possible.  Feel free to call the clinic should you have any questions or concerns. The clinic phone number is [phone].  Please show the CHEMO ALERT CARD at check-in to the Emergency Department and triage nurse.   

## 2021-01-09 DIAGNOSIS — Z85820 Personal history of malignant melanoma of skin: Secondary | ICD-10-CM | POA: Diagnosis not present

## 2021-01-09 DIAGNOSIS — D225 Melanocytic nevi of trunk: Secondary | ICD-10-CM | POA: Diagnosis not present

## 2021-01-09 DIAGNOSIS — L821 Other seborrheic keratosis: Secondary | ICD-10-CM | POA: Diagnosis not present

## 2021-01-09 DIAGNOSIS — D2272 Melanocytic nevi of left lower limb, including hip: Secondary | ICD-10-CM | POA: Diagnosis not present

## 2021-01-09 DIAGNOSIS — D1801 Hemangioma of skin and subcutaneous tissue: Secondary | ICD-10-CM | POA: Diagnosis not present

## 2021-01-09 DIAGNOSIS — L570 Actinic keratosis: Secondary | ICD-10-CM | POA: Diagnosis not present

## 2021-01-17 DIAGNOSIS — J3089 Other allergic rhinitis: Secondary | ICD-10-CM | POA: Diagnosis not present

## 2021-01-17 DIAGNOSIS — J3081 Allergic rhinitis due to animal (cat) (dog) hair and dander: Secondary | ICD-10-CM | POA: Diagnosis not present

## 2021-01-17 DIAGNOSIS — J301 Allergic rhinitis due to pollen: Secondary | ICD-10-CM | POA: Diagnosis not present

## 2021-01-23 ENCOUNTER — Other Ambulatory Visit: Payer: Self-pay | Admitting: Family Medicine

## 2021-01-24 ENCOUNTER — Other Ambulatory Visit: Payer: Self-pay | Admitting: Family Medicine

## 2021-01-29 ENCOUNTER — Inpatient Hospital Stay: Payer: Medicare Other

## 2021-01-29 ENCOUNTER — Other Ambulatory Visit: Payer: Self-pay

## 2021-01-29 ENCOUNTER — Inpatient Hospital Stay: Payer: Medicare Other | Attending: Oncology

## 2021-01-29 VITALS — BP 139/63 | HR 53 | Temp 98.2°F | Resp 18

## 2021-01-29 DIAGNOSIS — Z170 Estrogen receptor positive status [ER+]: Secondary | ICD-10-CM

## 2021-01-29 DIAGNOSIS — Z79899 Other long term (current) drug therapy: Secondary | ICD-10-CM | POA: Insufficient documentation

## 2021-01-29 DIAGNOSIS — C50111 Malignant neoplasm of central portion of right female breast: Secondary | ICD-10-CM | POA: Diagnosis not present

## 2021-01-29 DIAGNOSIS — Z95828 Presence of other vascular implants and grafts: Secondary | ICD-10-CM

## 2021-01-29 DIAGNOSIS — Z5112 Encounter for antineoplastic immunotherapy: Secondary | ICD-10-CM | POA: Diagnosis not present

## 2021-01-29 DIAGNOSIS — C50411 Malignant neoplasm of upper-outer quadrant of right female breast: Secondary | ICD-10-CM

## 2021-01-29 LAB — CBC WITH DIFFERENTIAL/PLATELET
Abs Immature Granulocytes: 0.01 10*3/uL (ref 0.00–0.07)
Basophils Absolute: 0 10*3/uL (ref 0.0–0.1)
Basophils Relative: 1 %
Eosinophils Absolute: 0.3 10*3/uL (ref 0.0–0.5)
Eosinophils Relative: 5 %
HCT: 38.8 % (ref 36.0–46.0)
Hemoglobin: 12.6 g/dL (ref 12.0–15.0)
Immature Granulocytes: 0 %
Lymphocytes Relative: 31 %
Lymphs Abs: 1.6 10*3/uL (ref 0.7–4.0)
MCH: 29.5 pg (ref 26.0–34.0)
MCHC: 32.5 g/dL (ref 30.0–36.0)
MCV: 90.9 fL (ref 80.0–100.0)
Monocytes Absolute: 0.4 10*3/uL (ref 0.1–1.0)
Monocytes Relative: 7 %
Neutro Abs: 2.8 10*3/uL (ref 1.7–7.7)
Neutrophils Relative %: 56 %
Platelets: 239 10*3/uL (ref 150–400)
RBC: 4.27 MIL/uL (ref 3.87–5.11)
RDW: 12.4 % (ref 11.5–15.5)
WBC: 5.1 10*3/uL (ref 4.0–10.5)
nRBC: 0 % (ref 0.0–0.2)

## 2021-01-29 LAB — COMPREHENSIVE METABOLIC PANEL
ALT: 9 U/L (ref 0–44)
AST: 15 U/L (ref 15–41)
Albumin: 3.4 g/dL — ABNORMAL LOW (ref 3.5–5.0)
Alkaline Phosphatase: 41 U/L (ref 38–126)
Anion gap: 5 (ref 5–15)
BUN: 13 mg/dL (ref 8–23)
CO2: 27 mmol/L (ref 22–32)
Calcium: 8.6 mg/dL — ABNORMAL LOW (ref 8.9–10.3)
Chloride: 104 mmol/L (ref 98–111)
Creatinine, Ser: 0.95 mg/dL (ref 0.44–1.00)
GFR, Estimated: >60 mL/min (ref >60)
Glucose, Bld: 171 mg/dL — ABNORMAL HIGH (ref 70–99)
Potassium: 3.7 mmol/L (ref 3.5–5.1)
Sodium: 136 mmol/L (ref 135–145)
Total Bilirubin: 0.3 mg/dL (ref 0.3–1.2)
Total Protein: 7 g/dL (ref 6.5–8.1)

## 2021-01-29 MED ORDER — ACETAMINOPHEN 325 MG PO TABS
650.0000 mg | ORAL_TABLET | Freq: Once | ORAL | Status: AC
  Administered 2021-01-29: 650 mg via ORAL

## 2021-01-29 MED ORDER — DIPHENHYDRAMINE HCL 12.5 MG/5ML PO ELIX
12.5000 mg | ORAL_SOLUTION | Freq: Once | ORAL | Status: AC
  Administered 2021-01-29: 12.5 mg via ORAL

## 2021-01-29 MED ORDER — ACETAMINOPHEN 325 MG PO TABS
ORAL_TABLET | ORAL | Status: AC
  Filled 2021-01-29: qty 2

## 2021-01-29 MED ORDER — SODIUM CHLORIDE 0.9 % IV SOLN
Freq: Once | INTRAVENOUS | Status: AC
  Filled 2021-01-29: qty 250

## 2021-01-29 MED ORDER — SODIUM CHLORIDE 0.9% FLUSH
10.0000 mL | Freq: Once | INTRAVENOUS | Status: AC
  Administered 2021-01-29: 10 mL
  Filled 2021-01-29: qty 10

## 2021-01-29 MED ORDER — TRASTUZUMAB-DKST CHEMO 150 MG IV SOLR
6.0000 mg/kg | Freq: Once | INTRAVENOUS | Status: AC
  Administered 2021-01-29: 399 mg via INTRAVENOUS
  Filled 2021-01-29: qty 19

## 2021-01-29 MED ORDER — DIPHENHYDRAMINE HCL 12.5 MG/5ML PO ELIX
ORAL_SOLUTION | ORAL | Status: AC
  Filled 2021-01-29: qty 5

## 2021-01-31 DIAGNOSIS — J301 Allergic rhinitis due to pollen: Secondary | ICD-10-CM | POA: Diagnosis not present

## 2021-01-31 DIAGNOSIS — J3081 Allergic rhinitis due to animal (cat) (dog) hair and dander: Secondary | ICD-10-CM | POA: Diagnosis not present

## 2021-01-31 DIAGNOSIS — J3089 Other allergic rhinitis: Secondary | ICD-10-CM | POA: Diagnosis not present

## 2021-02-01 ENCOUNTER — Other Ambulatory Visit: Payer: Self-pay | Admitting: Family Medicine

## 2021-02-12 DIAGNOSIS — J3089 Other allergic rhinitis: Secondary | ICD-10-CM | POA: Diagnosis not present

## 2021-02-12 DIAGNOSIS — J3081 Allergic rhinitis due to animal (cat) (dog) hair and dander: Secondary | ICD-10-CM | POA: Diagnosis not present

## 2021-02-12 DIAGNOSIS — J301 Allergic rhinitis due to pollen: Secondary | ICD-10-CM | POA: Diagnosis not present

## 2021-02-14 ENCOUNTER — Ambulatory Visit: Payer: Medicare Other

## 2021-02-15 NOTE — Progress Notes (Signed)
Subjective:   Theresa Bradley is a 70 y.o. female who presents for Medicare Annual (Subsequent) preventive examination.       Review of Systems    N/A  Virtual Visit via Video Note  I connected with Theresa Bradley by a video enabled telemedicine application and verified that I am speaking with the correct person using two identifiers.  Location: Patient: Home Provider: Office Persons participating in the virtual visit: patient, provider   I discussed the limitations of evaluation and management by telemedicine and the availability of in person appointments. The patient expressed understanding and agreed to proceed.     Theresa Baas Gerardine Peltz,LPN        Objective:    Today's Vitals   There is no height or weight on file to calculate BMI.  Advanced Directives 02/18/2021 01/29/2021 01/08/2021 10/16/2020 09/25/2020 07/03/2020 06/26/2020  Does Patient Have a Medical Advance Directive? Yes Yes Yes Yes Yes Yes Yes  Type of Paramedic of [place];Living will [place];Living will [place];Living will [place];Living will [place];Living will [place];Living will [place];Living will  Does patient want to make changes to medical advance directive? - No - Patient declined No - Patient declined No - Patient declined No - Patient declined - -  Copy of [place] in Chart? No - copy requested - No - copy requested No - copy requested No - copy requested No - copy requested No - copy requested  Would patient like information on creating a medical advance directive? - - - - - - -    Current Medications (verified) Outpatient Encounter Medications as of 02/18/2021  Medication Sig  . ALPRAZolam (XANAX) 0.25 MG tablet TAKE 1/2 A TABLET BY MOUTH AT BEDTIME AS NEEDED FOR INSOMNIA  . amLODipine (NORVASC) 5 MG tablet TAKE 1 TABLET BY MOUTH EVERY DAY   . BIOTIN 5000 PO Take by mouth.  . Calcium Carbonate-Vitamin D 600-200 MG-UNIT TABS Take 1 tablet by mouth daily.  . citalopram (CELEXA) 20 MG tablet TAKE 1 TABLET BY MOUTH EVERY DAY  . docusate sodium (COLACE) 100 MG capsule Take 100 mg by mouth daily as needed for mild constipation.   Marland Kitchen EPIPEN 2-PAK 0.3 MG/0.3ML SOAJ injection Inject 0.3 mg into the muscle as needed for anaphylaxis.   . famotidine (PEPCID) 10 MG tablet Take 10 mg by mouth daily.   Marland Kitchen gabapentin (NEURONTIN) 300 MG capsule Take 1 capsule (300 mg total) by mouth at bedtime.  Marland Kitchen levothyroxine (SYNTHROID) 50 MCG tablet TAKE 1 TABLET BY MOUTH EVERY DAY  . meloxicam (MOBIC) 15 MG tablet TAKE 1 TABLET BY MOUTH EVERY DAY  . tamoxifen (NOLVADEX) 20 MG tablet Take 20 mg by mouth daily.  Marland Kitchen tretinoin (RETIN-A) 0.025 % cream Apply topically at bedtime.  . [DISCONTINUED] estradiol (ESTRING) 2 MG vaginal ring Place 2 mg vaginally every 3 (three) months. follow package directions  . [DISCONTINUED] estradiol (ESTRING) 2 MG vaginal ring Place 2 mg vaginally every 3 (three) months. follow package directions  . [DISCONTINUED] fluocinonide gel (LIDEX) 0.05 %   . [DISCONTINUED] magic mouthwash SOLN Take 5 mLs by mouth 4 (four) times daily as needed for mouth pain.  . [DISCONTINUED] mupirocin ointment (BACTROBAN) 2 % APPLY TO AFFECTED AREA EVERY DAY  . [DISCONTINUED] omeprazole (PRILOSEC) 20 MG capsule Take 20 mg by mouth daily.  . [DISCONTINUED] prochlorperazine (COMPAZINE) 10 MG tablet Take 1 tablet (10  mg total) by mouth every 6 (six) hours as needed (Nausea or vomiting).   No facility-administered encounter medications on file as of 02/18/2021.    Allergies (verified) Codeine sulfate, Erythromycin base, and Esomeprazole magnesium   History: Past Medical History:  Diagnosis Date  . Anxiety   . Breast cancer ([place]) 2009   right  . Cancer ([place])   . COUGH, CHRONIC 05/10/2010  . Dysrhythmia    occationally "skips a beat"  . Family history  of brain cancer   . Family history of breast cancer   . Family history of lung cancer   . Family history of multiple myeloma   . Family history of skin cancer   . GERD (gastroesophageal reflux disease)   . HYPERTENSION 04/16/2009  . HYPOTHYROIDISM 04/16/2009  . OSTEOARTHRITIS 04/16/2009  . Personal history of radiation therapy   . SENILE LENTIGO 06/10/2010   Past Surgical History:  Procedure Laterality Date  . BREAST EXCISIONAL BIOPSY Right   . BREAST EXCISIONAL BIOPSY Left   . BREAST LUMPECTOMY Right 2009   radiation only  . BREAST LUMPECTOMY WITH RADIOACTIVE SEED AND SENTINEL LYMPH NODE BIOPSY Right 01/31/2020   Procedure: RIGHT BREAST LUMPECTOMY WITH RADIOACTIVE SEED AND SENTINEL LYMPH NODE BIOPSY;  Surgeon: [name], MD;  Location: [facility];  Service: General;  Laterality: Right;  PEC BLOCK  . BREAST SURGERY  2009   X 2, cancer stage 0, lumpectomy  . [place]  . CHOLECYSTECTOMY    . DILATATION & CURETTAGE/HYSTEROSCOPY WITH MYOSURE  11/2015  . HERNIA REPAIR  2009  . JOINT REPLACEMENT  2008   hip  . MASTECTOMY Right   . OPEN SURGICAL REPAIR OF GLUTEAL TENDON Left 07/18/2019   Procedure: Left hip bearing surface revision with gluteal tendon repair;  Surgeon: Gaynelle Arabian, MD;  Location: [facility];  Service: Orthopedics;  Laterality: Left;  2 hrs  . PORTACATH PLACEMENT N/A 03/15/2020   Procedure: INSERTION PORT-A-CATH WITH ULTRASOUND GUIDANCE;  Surgeon: [name], MD;  Location: [facility];  Service: General;  Laterality: N/A;  . SIMPLE MASTECTOMY WITH AXILLARY SENTINEL NODE BIOPSY Right 03/15/2020   Procedure: RIGHT SIMPLE MASTECTOMY;  Surgeon: [name], MD;  Location: [facility];  Service: General;  Laterality: Right;   Family History  Problem Relation Age of Onset  . Arthritis Other   . Hypertension Other   . Cancer Other        2 aunts  . Hyperlipidemia Father   . Dementia Father   . Skin  cancer Father   . Skin cancer Mother   . Breast cancer Maternal Aunt 63  . Breast cancer Maternal Aunt        dx. mid-70s  . Lung cancer Paternal Uncle   . Multiple myeloma Paternal Uncle   . Cancer Paternal Aunt        unknown type, dx. >50  . Brain cancer Cousin        dx. 57s, paternal cousin   Social History   Socioeconomic History  . Marital status: Married    Spouse name: Not on file  . Number of children: 2  . Years of education: Not on file  . Highest education level: Not on file  Occupational History  . Occupation: Cabin crew    CommentAssociate Professor  . 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 Smoking status: Never Smoker  .  Smokeless tobacco: Never Used  . Tobacco comment: father smoked when she was younger   Vaping Use  . Vaping Use: Never used  Substance and Sexual Activity  . Alcohol use: Yes    Comment: occasionally   . Drug use: No  . Sexual activity: Not on file  Other Topics Concern  . Not on file  Social History Narrative   Lives with husband on 2 level house, has two sons, one local with grandkids   Works FT as Cabin crew; Psychiatrist   Enjoys working out at gym about 2X/week, attends silver sneakers classes   Dad struggles with dementia, mother still living who is caretaker; occasionally travels to visit to assist mother.   Social Determinants of Health   Financial Resource Strain: High Risk  . Difficulty of Paying Living Expenses: Very hard  Food Insecurity: No Food Insecurity  . Worried About Charity fundraiser in the Last Year: Never true  . Ran Out of Food in the Last Year: Never true  Transportation Needs: No Transportation Needs  . Lack of Transportation (Medical): No  . Lack of Transportation (Non-Medical): No  Physical Activity: Inactive  . Days of Exercise per Week: 0 days  . Minutes of Exercise per Session: 0 min  Stress: No Stress Concern Present  . Feeling of Stress : Not at all   Social Connections: Moderately Integrated  . Frequency of Communication with Friends and Family: More than three times a week  . Frequency of Social Gatherings with Friends and Family: Twice a week  . Attends Religious Services: Never  . Active Member of Clubs or Organizations: Yes  . Attends Archivist Meetings: 1 to 4 times per year  . Marital Status: Married    Tobacco Counseling Counseling given: Not Answered Comment: father smoked when she was younger    Clinical Intake:     Pain : No/denies pain     Nutritional Risks: None Diabetes: No  How often do you need to have someone help you when you read instructions, pamphlets, or other written materials from your doctor or pharmacy?: 1 - Never What is the last grade level you completed in school?: college  Diabetic?no  Interpreter Needed?: No  Information entered by :: l.Tytan Sandate, Pn   Activities of Daily Living In your present state of health, do you have any difficulty performing the following activities: 02/18/2021  Hearing? N  Vision? N  Difficulty concentrating or making decisions? N  Walking or climbing stairs? N  Dressing or bathing? N  Doing errands, shopping? N  Preparing Food and eating ? N  Using the Toilet? N  In the past six months, have you accidently leaked urine? N  Do you have problems with loss of bowel control? Y  Managing your Medications? N  Managing your Finances? N  Housekeeping or managing your Housekeeping? N  Some recent data might be hidden    Patient Care Team: [name], MD as PCP - General [name], [name], MD as Consulting Physician (Allergy and Immunology) [name], OD as Referring Physician [name], RN as Oncology Nurse Navigator [name], RN as Oncology Nurse Navigator [name], MD as Consulting Physician (General Surgery) [name], [name], MD as Consulting Physician (Oncology) [name], MD as Attending Physician  (Radiation Oncology) [name], MD as Referring Physician (Dermatology) [name], MD as Consulting Physician (Cardiology)  Indicate any recent Medical Services you may have received from other than  [facility] providers in the past year (date may be approximate).     Assessment:   This is a routine wellness examination for [place].  Hearing/Vision screen  Hearing Screening   '125Hz'  '250Hz'  '500Hz'  '1000Hz'  '2000Hz'  '3000Hz'  '4000Hz'  '6000Hz'  '8000Hz'   Right ear:           Left ear:           Vision Screening Comments: Annual exams wears glasses  Dietary issues and exercise activities discussed:    Goals    . Exercise 3x per week (30 min per time)     Three times a week     . Patient Stated     Lose weight to get to 155lb to reduce joint pain, get rid of belly fat    . Weight (lb) < 150 lb (68 kg)     Portion control and eating less sweets Focus on good nutrition  Leans and green  Watch sodium         Depression Screen PHQ 2/9 Scores 02/18/2021 02/18/2021 12/31/2018 12/02/2016 11/06/2015 09/29/2014  PHQ - 2 Score 0 0 0 0 0 0  PHQ- 9 Score - - 1 - - -    Fall Risk Fall Risk  02/18/2021 12/31/2018 12/29/2017 12/02/2016 11/06/2015  Falls in the past year? - 0 No No Yes  Comment - - dislocated hip from turing in chair but no fall  - -  Number falls in past yr: - - - - 1  Follow up Falls evaluation completed - - - Follow up appointment    [place]:  Any stairs in or around the home? Yes  If so, are there any without handrails? Yes  Home free of loose throw rugs in walkways, pet beds, electrical cords, etc? Yes  Adequate lighting in your home to reduce risk of falls? Yes   ASSISTIVE DEVICES UTILIZED TO PREVENT FALLS:  Life alert? Yes  Use of a cane, walker or w/c? No  Grab bars in the bathroom? No  Shower chair or bench in shower? No  Elevated toilet seat or a handicapped toilet? Yes     Cognitive Function: MMSE - Mini Mental State Exam  12/29/2017  Not completed: (No Data)        Immunizations Immunization History  Administered Date(s) Administered  . Fluad Quad(high Dose 65+) 08/15/2019, 09/20/2020  . Influenza Split 09/08/2011, 08/20/2012, 09/26/2013  . Influenza Whole 09/11/2010  . Influenza, High Dose Seasonal PF 10/03/2015, 08/14/2016, 09/01/2017, 09/06/2018  . Influenza,inj,Quad PF,6+ Mos 09/01/2014  . Influenza,inj,quad, With Preservative 07/27/2017, 08/24/2018  . PFIZER(Purple Top)SARS-COV-2 Vaccination 12/18/2019, 01/09/2020  . Pneumococcal Conjugate-13 09/29/2014  . Pneumococcal Polysaccharide-23 11/24/2006, 11/06/2015  . Td 11/25/1999, 06/10/2010  . Zoster 07/09/2010  . Zoster Recombinat (Shingrix) 06/05/2017, 10/24/2017    TDAP status: Due, Education has been provided regarding the importance of this vaccine. Advised may receive this vaccine at local pharmacy or Health Dept. Aware to provide a copy of the vaccination record if obtained from local pharmacy or Health Dept. Verbalized acceptance and understanding.  Flu Vaccine status: Due, Education has been provided regarding the importance of this vaccine. Advised may receive this vaccine at local pharmacy or Health Dept. Aware to provide a copy of the vaccination record if obtained from local pharmacy or Health Dept. Verbalized acceptance and understanding.  Pneumococcal vaccine status: Due, Education has been provided regarding the importance of this vaccine. Advised may receive this vaccine at local pharmacy or Health Dept. Aware to  provide a copy of the vaccination record if obtained from local pharmacy or Health Dept. Verbalized acceptance and understanding.  Covid-19 vaccine status: Completed vaccines  Qualifies for Shingles Vaccine? Yes   Zostavax completed No   Shingrix Completed?: No.    Education has been provided regarding the importance of this vaccine. Patient has been advised to call insurance company to determine out of pocket expense if they  have not yet received this vaccine. Advised may also receive vaccine at local pharmacy or Health Dept. Verbalized acceptance and understanding.  Screening Tests Health Maintenance  Topic Date Due  . TETANUS/TDAP  06/10/2020  . COVID-19 Vaccine (4 - Booster for [place] series) 11/25/2020  . COLONOSCOPY (Pts 45-80yr Insurance coverage will need to be confirmed)  08/07/2022 (Originally 04/23/2019)  . MAMMOGRAM  12/25/2021  . INFLUENZA VACCINE  Completed  . DEXA SCAN  Completed  . PNA vac Low Risk Adult  Completed  . HPV VACCINES  Aged Out  . Hepatitis C Screening  Discontinued    Health Maintenance  Health Maintenance Due  Topic Date Due  . TETANUS/TDAP  06/10/2020  . COVID-19 Vaccine (4 - Booster for Pfizer series) 11/25/2020    Colorectal cancer screening: Type of screening: Colonoscopy. Completed 04/22/2009. Repeat every 10 years  Mammogram status: Ordered 12/18/2020. Pt provided with contact info and advised to call to schedule appt.   Bone Density status: Ordered 06/19/20. Pt provided with contact info and advised to call to schedule appt.  Lung Cancer Screening: (Low Dose CT Chest recommended if Age 72-80years, 30 pack-year currently smoking OR have quit w/in 15years.) does not qualify.   Lung Cancer Screening Referral: N/A  Additional Screening:  Hepatitis C Screening: does qualify  Vision Screening: Recommended annual ophthalmology exams for early detection of glaucoma and other disorders of the eye. Is the patient up to date with their annual eye exam?  Yes   Who is the provider or what is the name of the office in which the patient attends annual eye exams? Dr.Lyles If pt is not established with a provider, would they like to be referred to a provider to establish care? No .   Dental Screening: Recommended annual dental exams for proper oral hygiene  Community Resource Referral / Chronic Care Management: CRR required this visit?  No   CCM required this visit?  No       Plan:     I have personally reviewed and noted the following in the patient's chart:   . Medical and social history . Use of alcohol, tobacco or illicit drugs  . Current medications and supplements . Functional ability and status . Nutritional status . Physical activity . Advanced directives . List of other physicians . Hospitalizations, surgeries, and ER visits in previous 12 months . Vitals . Screenings to include cognitive, depression, and falls . Referrals and appointments  In addition, I have reviewed and discussed with patient certain preventive protocols, quality metrics, and best practice recommendations. A written personalized care plan for preventive services as well as general preventive health recommendations were provided to patient.     LRandel Pigg LPN   [id number]  Nurse Notes: None

## 2021-02-18 ENCOUNTER — Ambulatory Visit (INDEPENDENT_AMBULATORY_CARE_PROVIDER_SITE_OTHER): Payer: Medicare Other

## 2021-02-18 DIAGNOSIS — Z0000 Encounter for general adult medical examination without abnormal findings: Secondary | ICD-10-CM | POA: Diagnosis not present

## 2021-02-18 DIAGNOSIS — Z1211 Encounter for screening for malignant neoplasm of colon: Secondary | ICD-10-CM

## 2021-02-18 NOTE — Patient Instructions (Signed)
[name] , Thank you for taking time to come for your Medicare Wellness Visit. I appreciate your ongoing commitment to your health goals. Please review the following plan we discussed and let me know if I can assist you in the future.   Screening recommendations/referrals: Colonoscopy: referral placed Mammogram: 03/29/2021 Bone Density: Referral  placed  Recommended yearly ophthalmology/optometry visit for glaucoma screening and checkup Recommended yearly dental visit for hygiene and checkup  Vaccinations: Influenza vaccine: completed: due in the fall Pneumococcal vaccine: completed series Tdap vaccine: obtain with injury Shingles vaccine: will obtain from local pharmacy    Advanced directives: none   Conditions/risks identified: none   Next appointment: 4/1//2022  0800 Dr. Elease Hashimoto   Preventive Care 65 Years and Older, Female Preventive care refers to lifestyle choices and visits with your health care provider that can promote health and wellness. What does preventive care include?  A yearly physical exam. This is also called an annual well check.  Dental exams once or twice a year.  Routine eye exams. Ask your health care provider how often you should have your eyes checked.  Personal lifestyle choices, including:  Daily care of your teeth and gums.  Regular physical activity.  Eating a healthy diet.  Avoiding tobacco and drug use.  Limiting alcohol use.  Practicing safe sex.  Taking low-dose aspirin every day.  Taking vitamin and mineral supplements as recommended by your health care provider. What happens during an annual well check? The services and screenings done by your health care provider during your annual well check will depend on your age, overall health, lifestyle risk factors, and family history of disease. Counseling  Your health care provider may ask you questions about your:  Alcohol use.  Tobacco use.  Drug use.  Emotional  well-being.  Home and relationship well-being.  Sexual activity.  Eating habits.  History of falls.  Memory and ability to understand (cognition).  Work and work Statistician.  Reproductive health. Screening  You may have the following tests or measurements:  Height, weight, and BMI.  Blood pressure.  Lipid and cholesterol levels. These may be checked every 5 years, or more frequently if you are over 10 years old.  Skin check.  Lung cancer screening. You may have this screening every year starting at age 2 if you have a 30-pack-year history of smoking and currently smoke or have quit within the past 15 years.  Fecal occult blood test (FOBT) of the stool. You may have this test every year starting at age 51.  Flexible sigmoidoscopy or colonoscopy. You may have a sigmoidoscopy every 5 years or a colonoscopy every 10 years starting at age 63.  Hepatitis C blood test.  Hepatitis B blood test.  Sexually transmitted disease (STD) testing.  Diabetes screening. This is done by checking your blood sugar (glucose) after you have not eaten for a while (fasting). You may have this done every 1-3 years.  Bone density scan. This is done to screen for osteoporosis. You may have this done starting at age 47.  Mammogram. This may be done every 1-2 years. Talk to your health care provider about how often you should have regular mammograms. Talk with your health care provider about your test results, treatment options, and if necessary, the need for more tests. Vaccines  Your health care provider may recommend certain vaccines, such as:  Influenza vaccine. This is recommended every year.  Tetanus, diphtheria, and acellular pertussis (Tdap, Td) vaccine. You may need a Td  booster every 10 years.  Zoster vaccine. You may need this after age 53.  Pneumococcal 13-valent conjugate (PCV13) vaccine. One dose is recommended after age 27.  Pneumococcal polysaccharide (PPSV23) vaccine. One  dose is recommended after age 21. Talk to your health care provider about which screenings and vaccines you need and how often you need them. This information is not intended to replace advice given to you by your health care provider. Make sure you discuss any questions you have with your health care provider. Document Released: 12/07/2015 Document Revised: 07/30/2016 Document Reviewed: 09/11/2015 Elsevier Interactive Patient Education  2017 [place] Prevention in the Home Falls can cause injuries. They can happen to people of all ages. There are many things you can do to make your home safe and to help prevent falls. What can I do on the outside of my home?  Regularly fix the edges of walkways and driveways and fix any cracks.  Remove anything that might make you trip as you walk through a door, such as a raised step or threshold.  Trim any bushes or trees on the path to your home.  Use bright outdoor lighting.  Clear any walking paths of anything that might make someone trip, such as rocks or tools.  Regularly check to see if handrails are loose or broken. Make sure that both sides of any steps have handrails.  Any raised decks and porches should have guardrails on the edges.  Have any leaves, snow, or ice cleared regularly.  Use sand or salt on walking paths during winter.  Clean up any spills in your garage right away. This includes oil or grease spills. What can I do in the bathroom?  Use night lights.  Install grab bars by the toilet and in the tub and shower. Do not use towel bars as grab bars.  Use non-skid mats or decals in the tub or shower.  If you need to sit down in the shower, use a plastic, non-slip stool.  Keep the floor dry. Clean up any water that spills on the floor as soon as it happens.  Remove soap buildup in the tub or shower regularly.  Attach bath mats securely with double-sided non-slip rug tape.  Do not have throw rugs and other  things on the floor that can make you trip. What can I do in the bedroom?  Use night lights.  Make sure that you have a light by your bed that is easy to reach.  Do not use any sheets or blankets that are too big for your bed. They should not hang down onto the floor.  Have a firm chair that has side arms. You can use this for support while you get dressed.  Do not have throw rugs and other things on the floor that can make you trip. What can I do in the kitchen?  Clean up any spills right away.  Avoid walking on wet floors.  Keep items that you use a lot in easy-to-reach places.  If you need to reach something above you, use a strong step stool that has a grab bar.  Keep electrical cords out of the way.  Do not use floor polish or wax that makes floors slippery. If you must use wax, use non-skid floor wax.  Do not have throw rugs and other things on the floor that can make you trip. What can I do with my stairs?  Do not leave any items on the stairs.  Make sure that there are handrails on both sides of the stairs and use them. Fix handrails that are broken or loose. Make sure that handrails are as long as the stairways.  Check any carpeting to make sure that it is firmly attached to the stairs. Fix any carpet that is loose or worn.  Avoid having throw rugs at the top or bottom of the stairs. If you do have throw rugs, attach them to the floor with carpet tape.  Make sure that you have a light switch at the top of the stairs and the bottom of the stairs. If you do not have them, ask someone to add them for you. What else can I do to help prevent falls?  Wear shoes that:  Do not have high heels.  Have rubber bottoms.  Are comfortable and fit you well.  Are closed at the toe. Do not wear sandals.  If you use a stepladder:  Make sure that it is fully opened. Do not climb a closed stepladder.  Make sure that both sides of the stepladder are locked into place.  Ask  someone to hold it for you, if possible.  Clearly mark and make sure that you can see:  Any grab bars or handrails.  First and last steps.  Where the edge of each step is.  Use tools that help you move around (mobility aids) if they are needed. These include:  Canes.  Walkers.  Scooters.  Crutches.  Turn on the lights when you go into a dark area. Replace any light bulbs as soon as they burn out.  Set up your furniture so you have a clear path. Avoid moving your furniture around.  If any of your floors are uneven, fix them.  If there are any pets around you, be aware of where they are.  Review your medicines with your doctor. Some medicines can make you feel dizzy. This can increase your chance of falling. Ask your doctor what other things that you can do to help prevent falls. This information is not intended to replace advice given to you by your health care provider. Make sure you discuss any questions you have with your health care provider. Document Released: 09/06/2009 Document Revised: 04/17/2016 Document Reviewed: 12/15/2014 Elsevier Interactive Patient Education  2017 [employer].

## 2021-02-19 ENCOUNTER — Inpatient Hospital Stay: Payer: Medicare Other

## 2021-02-19 ENCOUNTER — Other Ambulatory Visit: Payer: Self-pay

## 2021-02-19 VITALS — BP 164/75 | HR 58 | Temp 98.1°F | Resp 17 | Wt 152.0 lb

## 2021-02-19 DIAGNOSIS — C50111 Malignant neoplasm of central portion of right female breast: Secondary | ICD-10-CM | POA: Diagnosis not present

## 2021-02-19 DIAGNOSIS — Z170 Estrogen receptor positive status [ER+]: Secondary | ICD-10-CM

## 2021-02-19 DIAGNOSIS — C50411 Malignant neoplasm of upper-outer quadrant of right female breast: Secondary | ICD-10-CM

## 2021-02-19 DIAGNOSIS — Z5112 Encounter for antineoplastic immunotherapy: Secondary | ICD-10-CM | POA: Diagnosis not present

## 2021-02-19 DIAGNOSIS — Z79899 Other long term (current) drug therapy: Secondary | ICD-10-CM | POA: Diagnosis not present

## 2021-02-19 LAB — CBC WITH DIFFERENTIAL/PLATELET
Abs Immature Granulocytes: 0.01 10*3/uL (ref 0.00–0.07)
Basophils Absolute: 0 10*3/uL (ref 0.0–0.1)
Basophils Relative: 1 %
Eosinophils Absolute: 0.2 10*3/uL (ref 0.0–0.5)
Eosinophils Relative: 4 %
HCT: 37.9 % (ref 36.0–46.0)
Hemoglobin: 12.8 g/dL (ref 12.0–15.0)
Immature Granulocytes: 0 %
Lymphocytes Relative: 29 %
Lymphs Abs: 1.5 10*3/uL (ref 0.7–4.0)
MCH: 29.8 pg (ref 26.0–34.0)
MCHC: 33.8 g/dL (ref 30.0–36.0)
MCV: 88.3 fL (ref 80.0–100.0)
Monocytes Absolute: 0.3 10*3/uL (ref 0.1–1.0)
Monocytes Relative: 7 %
Neutro Abs: 3 10*3/uL (ref 1.7–7.7)
Neutrophils Relative %: 59 %
Platelets: 254 10*3/uL (ref 150–400)
RBC: 4.29 MIL/uL (ref 3.87–5.11)
RDW: 12.4 % (ref 11.5–15.5)
WBC: 5.1 10*3/uL (ref 4.0–10.5)
nRBC: 0 % (ref 0.0–0.2)

## 2021-02-19 LAB — COMPREHENSIVE METABOLIC PANEL
ALT: 9 U/L (ref 0–44)
AST: 14 U/L — ABNORMAL LOW (ref 15–41)
Albumin: 3.5 g/dL (ref 3.5–5.0)
Alkaline Phosphatase: 44 U/L (ref 38–126)
Anion gap: 10 (ref 5–15)
BUN: 14 mg/dL (ref 8–23)
CO2: 27 mmol/L (ref 22–32)
Calcium: 8.5 mg/dL — ABNORMAL LOW (ref 8.9–10.3)
Chloride: 103 mmol/L (ref 98–111)
Creatinine, Ser: 0.96 mg/dL (ref 0.44–1.00)
GFR, Estimated: >60 mL/min (ref >60)
Glucose, Bld: 169 mg/dL — ABNORMAL HIGH (ref 70–99)
Potassium: 3.6 mmol/L (ref 3.5–5.1)
Sodium: 140 mmol/L (ref 135–145)
Total Bilirubin: 0.4 mg/dL (ref 0.3–1.2)
Total Protein: 7 g/dL (ref 6.5–8.1)

## 2021-02-19 MED ORDER — SODIUM CHLORIDE 0.9% FLUSH
10.0000 mL | INTRAVENOUS | Status: DC | PRN
  Administered 2021-02-19: 10 mL
  Filled 2021-02-19: qty 10

## 2021-02-19 MED ORDER — SODIUM CHLORIDE 0.9 % IV SOLN
Freq: Once | INTRAVENOUS | Status: AC
  Filled 2021-02-19: qty 250

## 2021-02-19 MED ORDER — ACETAMINOPHEN 325 MG PO TABS
ORAL_TABLET | ORAL | Status: AC
  Filled 2021-02-19: qty 2

## 2021-02-19 MED ORDER — HEPARIN SOD (PORK) LOCK FLUSH 100 UNIT/ML IV SOLN
500.0000 [IU] | Freq: Once | INTRAVENOUS | Status: AC | PRN
  Administered 2021-02-19: 500 [IU]
  Filled 2021-02-19: qty 5

## 2021-02-19 MED ORDER — TRASTUZUMAB-DKST CHEMO 150 MG IV SOLR
6.0000 mg/kg | Freq: Once | INTRAVENOUS | Status: AC
  Administered 2021-02-19: 399 mg via INTRAVENOUS
  Filled 2021-02-19: qty 19

## 2021-02-19 MED ORDER — DIPHENHYDRAMINE HCL 12.5 MG/5ML PO ELIX
12.5000 mg | ORAL_SOLUTION | Freq: Once | ORAL | Status: AC
  Administered 2021-02-19: 12.5 mg via ORAL

## 2021-02-19 MED ORDER — ACETAMINOPHEN 325 MG PO TABS
650.0000 mg | ORAL_TABLET | Freq: Once | ORAL | Status: AC
  Administered 2021-02-19: 650 mg via ORAL

## 2021-02-19 MED ORDER — DIPHENHYDRAMINE HCL 12.5 MG/5ML PO ELIX
ORAL_SOLUTION | ORAL | Status: AC
  Filled 2021-02-19: qty 5

## 2021-02-21 ENCOUNTER — Other Ambulatory Visit: Payer: Self-pay

## 2021-02-22 ENCOUNTER — Encounter: Payer: Self-pay | Admitting: Family Medicine

## 2021-02-22 ENCOUNTER — Ambulatory Visit (INDEPENDENT_AMBULATORY_CARE_PROVIDER_SITE_OTHER): Payer: Medicare Other | Admitting: Family Medicine

## 2021-02-22 VITALS — BP 124/70 | HR 56 | Temp 97.6°F | Wt 150.3 lb

## 2021-02-22 DIAGNOSIS — Z0000 Encounter for general adult medical examination without abnormal findings: Secondary | ICD-10-CM | POA: Diagnosis not present

## 2021-02-22 DIAGNOSIS — I10 Essential (primary) hypertension: Secondary | ICD-10-CM

## 2021-02-22 LAB — LIPID PANEL
Cholesterol: 201 mg/dL — ABNORMAL HIGH (ref 0–200)
HDL: 44.3 mg/dL (ref >39.00)
LDL Cholesterol: 129 mg/dL — ABNORMAL HIGH (ref 0–99)
NonHDL: 156.94
Total CHOL/HDL Ratio: 5
Triglycerides: 142 mg/dL (ref 0.0–149.0)
VLDL: 28.4 mg/dL (ref 0.0–40.0)

## 2021-02-22 LAB — HEMOGLOBIN A1C: Hgb A1c MFr Bld: 5.7 % (ref 4.6–6.5)

## 2021-02-22 LAB — TSH: TSH: 3.82 u[IU]/mL (ref 0.35–4.50)

## 2021-02-22 NOTE — Patient Instructions (Signed)
Consider stopping the stool softener  Consider daily fiber supplement such as Fibercon, Metamucil, or Citrucel  Could use Miralax as needed for constipation.

## 2021-02-22 NOTE — Progress Notes (Signed)
Established Patient Office Visit  Subjective:  Patient ID: Theresa Bradley, female    DOB: 1951/06/19  Age: 70 y.o. MRN: [id number]  CC:  Chief Complaint  Patient presents with  . Annual Exam    No new cocnerns    HPI Theresa Bradley presents for physical exam.  A little over a year ago she came in with breast lump and we obtained diagnostic mammogram.  This did proved to be breast cancer.  She has gone through mastectomy after failed lumpectomy and has had chemo.  She also had prior history of breast cancer in situ 2009 treated with radiation therapy.  She has lost substantial weight but is slowly regaining her appetite following the chemo.  She relates occasional "leakage "after bowel movement.  Generally not having to strain but has had constipation issues in the past.  Still taking 1 stool softener daily.  Health maintenance reviewed  -Vaccines up-to-date. -She had Cologuard 2 years ago which was negative.  She is undecided regarding further colonoscopies.  She thinks she will stick with Cologuard  Social history-she is married.  She has 2 sons.  Still works in [employer] part-time  Family history-Father had dementia.  She is not sure but thinks this is Alzheimer's type.  Her mother is alive at 69 and relatively healthy.  2 maternal aunts with breast cancer history  Past Medical History:  Diagnosis Date  . Anxiety   . Breast cancer ([place]) 2009   right  . Cancer ([place])   . COUGH, CHRONIC 05/10/2010  . Dysrhythmia    occationally "skips a beat"  . Family history of brain cancer   . Family history of breast cancer   . Family history of lung cancer   . Family history of multiple myeloma   . Family history of skin cancer   . GERD (gastroesophageal reflux disease)   . HYPERTENSION 04/16/2009  . HYPOTHYROIDISM 04/16/2009  . OSTEOARTHRITIS 04/16/2009  . Personal history of radiation therapy   . SENILE LENTIGO 06/10/2010    Past Surgical History:  Procedure Laterality Date  .  BREAST EXCISIONAL BIOPSY Right   . BREAST EXCISIONAL BIOPSY Left   . BREAST LUMPECTOMY Right 2009   radiation only  . BREAST LUMPECTOMY WITH RADIOACTIVE SEED AND SENTINEL LYMPH NODE BIOPSY Right 01/31/2020   Procedure: RIGHT BREAST LUMPECTOMY WITH RADIOACTIVE SEED AND SENTINEL LYMPH NODE BIOPSY;  Surgeon: [name], MD;  Location: [facility];  Service: General;  Laterality: Right;  PEC BLOCK  . BREAST SURGERY  2009   X 2, cancer stage 0, lumpectomy  . [place]  . CHOLECYSTECTOMY    . DILATATION & CURETTAGE/HYSTEROSCOPY WITH MYOSURE  11/2015  . HERNIA REPAIR  2009  . JOINT REPLACEMENT  2008   hip  . MASTECTOMY Right   . OPEN SURGICAL REPAIR OF GLUTEAL TENDON Left 07/18/2019   Procedure: Left hip bearing surface revision with gluteal tendon repair;  Surgeon: Gaynelle Arabian, MD;  Location: [facility];  Service: Orthopedics;  Laterality: Left;  2 hrs  . PORTACATH PLACEMENT N/A 03/15/2020   Procedure: INSERTION PORT-A-CATH WITH ULTRASOUND GUIDANCE;  Surgeon: [name], MD;  Location: [facility];  Service: General;  Laterality: N/A;  . SIMPLE MASTECTOMY WITH AXILLARY SENTINEL NODE BIOPSY Right 03/15/2020   Procedure: RIGHT SIMPLE MASTECTOMY;  Surgeon: [name], MD;  Location: [facility];  Service: General;  Laterality: Right;    Family History  Problem  Relation Age of Onset  . Arthritis Other   . Hypertension Other   . Cancer Other        2 aunts  . Hyperlipidemia Father   . Dementia Father   . Skin cancer Father   . Skin cancer Mother   . Breast cancer Maternal Aunt 63  . Breast cancer Maternal Aunt        dx. mid-70s  . Lung cancer Paternal Uncle   . Multiple myeloma Paternal Uncle   . Hypertension Brother   . Cancer Paternal Aunt        unknown type, dx. >50  . Brain cancer Cousin        dx. 31s, paternal cousin    Social History   Socioeconomic History  . Marital status: Married    Spouse  name: Not on file  . Number of children: 2  . Years of education: Not on file  . Highest education level: Not on file  Occupational History  . Occupation: Cabin crew    CommentAssociate Professor  . 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 Smoking status: Never Smoker  . Smokeless tobacco: Never Used  . Tobacco comment: father smoked when she was younger   Vaping Use  . Vaping Use: Never used  Substance and Sexual Activity  . Alcohol use: Yes    Comment: occasionally   . Drug use: No  . Sexual activity: Not on file  Other Topics Concern  . Not on file  Social History Narrative   Lives with husband on 2 level house, has two sons, one local with grandkids   Works FT as Cabin crew; Psychiatrist   Enjoys working out at gym about 2X/week, attends silver sneakers classes   Dad struggles with dementia, mother still living who is caretaker; occasionally travels to visit to assist mother.   Social Determinants of Health   Financial Resource Strain: High Risk  . Difficulty of Paying Living Expenses: Very hard  Food Insecurity: No Food Insecurity  . Worried About Charity fundraiser in the Last Year: Never true  . Ran Out of Food in the Last Year: Never true  Transportation Needs: No Transportation Needs  . Lack of Transportation (Medical): No  . Lack of Transportation (Non-Medical): No  Physical Activity: Inactive  . Days of Exercise per Week: 0 days  . Minutes of Exercise per Session: 0 min  Stress: No Stress Concern Present  . Feeling of Stress : Not at all  Social Connections: Moderately Integrated  . Frequency of Communication with Friends and Family: More than three times a week  . Frequency of Social Gatherings with Friends and Family: Twice a week  . Attends Religious Services: Never  . Active Member of Clubs or Organizations: Yes  . Attends Archivist Meetings: 1 to 4 times per year  . Marital Status: Married  Arboriculturist Violence: Not At Risk  . Fear of Current or Ex-Partner: No  . Emotionally Abused: No  . Physically Abused: No  . Sexually Abused: No    Outpatient Medications Prior to Visit  Medication Sig Dispense Refill  . ALPRAZolam (XANAX) 0.25 MG tablet TAKE 1/2 A TABLET BY MOUTH AT BEDTIME AS NEEDED FOR INSOMNIA 45 tablet 0  . amLODipine (NORVASC) 5 MG tablet TAKE 1 TABLET BY MOUTH EVERY DAY 90 tablet 3  . BIOTIN 5000 PO Take by mouth.    Marland Kitchen  Calcium Carbonate-Vitamin D 600-200 MG-UNIT TABS Take 1 tablet by mouth daily.    . citalopram (CELEXA) 20 MG tablet TAKE 1 TABLET BY MOUTH EVERY DAY 30 tablet 0  . docusate sodium (COLACE) 100 MG capsule Take 100 mg by mouth daily as needed for mild constipation.     Marland Kitchen EPIPEN 2-PAK 0.3 MG/0.3ML SOAJ injection Inject 0.3 mg into the muscle as needed for anaphylaxis.     . famotidine (PEPCID) 10 MG tablet Take 10 mg by mouth daily.     Marland Kitchen gabapentin (NEURONTIN) 300 MG capsule Take 1 capsule (300 mg total) by mouth at bedtime. 90 capsule 4  . levothyroxine (SYNTHROID) 50 MCG tablet TAKE 1 TABLET BY MOUTH EVERY DAY 90 tablet 0  . meloxicam (MOBIC) 15 MG tablet TAKE 1 TABLET BY MOUTH EVERY DAY 90 tablet 1  . tamoxifen (NOLVADEX) 20 MG tablet Take 20 mg by mouth daily.    Marland Kitchen tretinoin (RETIN-A) 0.025 % cream Apply topically at bedtime. 45 g 6   No facility-administered medications prior to visit.    Allergies  Allergen Reactions  . Codeine Sulfate Nausea And Vomiting  . Erythromycin Base Other (See Comments)    Stomach ache  . Esomeprazole Magnesium     REACTION: GI upset    ROS Review of Systems  Constitutional: Negative for activity change, appetite change, fatigue, fever and unexpected weight change.  HENT: Negative for ear pain, hearing loss, sore throat and trouble swallowing.   Eyes: Negative for visual disturbance.  Respiratory: Negative for cough and shortness of breath.   Cardiovascular: Negative for chest pain and palpitations.   Gastrointestinal: Negative for abdominal pain, blood in stool, constipation and diarrhea.  Genitourinary: Negative for dysuria and hematuria.  Musculoskeletal: Negative for arthralgias, back pain and myalgias.  Skin: Negative for rash.  Neurological: Negative for dizziness, syncope and headaches.  Hematological: Negative for adenopathy.  Psychiatric/Behavioral: Negative for confusion and dysphoric mood.      Objective:    Physical Exam Constitutional:      Appearance: She is well-developed.  HENT:     Head: Normocephalic and atraumatic.  Eyes:     Pupils: Pupils are equal, round, and reactive to light.  Neck:     Thyroid: No thyromegaly.  Cardiovascular:     Rate and Rhythm: Normal rate and regular rhythm.     Heart sounds: Normal heart sounds. No murmur heard.   Pulmonary:     Effort: No respiratory distress.     Breath sounds: Normal breath sounds. No wheezing or rales.  Abdominal:     General: Bowel sounds are normal. There is no distension.     Palpations: Abdomen is soft. There is no mass.     Tenderness: There is no abdominal tenderness. There is no guarding or rebound.  Musculoskeletal:        General: Normal range of motion.     Cervical back: Normal range of motion and neck supple.  Lymphadenopathy:     Cervical: No cervical adenopathy.  Skin:    Findings: No rash.  Neurological:     Mental Status: She is alert and oriented to person, place, and time.     Cranial Nerves: No cranial nerve deficit.     Deep Tendon Reflexes: Reflexes normal.  Psychiatric:        Behavior: Behavior normal.        Thought Content: Thought content normal.        Judgment: Judgment normal.  BP 124/70 (BP Location: Left Arm, Patient Position: Sitting, Cuff Size: Normal)   Pulse (!) 56   Temp 97.6 F (36.4 C) (Oral)   Wt 150 lb 4.8 oz (68.2 kg)   SpO2 96%   BMI 25.80 kg/m  Wt Readings from Last 3 Encounters:  02/22/21 150 lb 4.8 oz (68.2 kg)  02/19/21 152 lb (68.9 kg)   01/08/21 153 lb 8 oz (69.6 kg)     Health Maintenance Due  Topic Date Due  . COVID-19 Vaccine (3 - Pfizer risk 4-dose series) 02/06/2020  . TETANUS/TDAP  06/10/2020    There are no preventive care reminders to display for this patient.  Lab Results  Component Value Date   TSH 0.75 08/15/2019   Lab Results  Component Value Date   WBC 5.1 02/19/2021   HGB 12.8 02/19/2021   HCT 37.9 02/19/2021   MCV 88.3 02/19/2021   PLT 254 02/19/2021   Lab Results  Component Value Date   NA 140 02/19/2021   K 3.6 02/19/2021   CO2 27 02/19/2021   GLUCOSE 169 (H) 02/19/2021   BUN 14 02/19/2021   CREATININE 0.96 02/19/2021   BILITOT 0.4 02/19/2021   ALKPHOS 44 02/19/2021   AST 14 (L) 02/19/2021   ALT 9 02/19/2021   PROT 7.0 02/19/2021   ALBUMIN 3.5 02/19/2021   CALCIUM 8.5 (L) 02/19/2021   ANIONGAP 10 02/19/2021   GFR 80.08 06/29/2019   Lab Results  Component Value Date   CHOL 183 05/12/2018   Lab Results  Component Value Date   HDL 45.80 05/12/2018   Lab Results  Component Value Date   LDLCALC 110 (H) 05/12/2018   Lab Results  Component Value Date   TRIG 137.0 05/12/2018   Lab Results  Component Value Date   CHOLHDL 4 05/12/2018   Lab Results  Component Value Date   HGBA1C 5.7 06/29/2019      Assessment & Plan:   Problem List Items Addressed This Visit   None   Visit Diagnoses    Physical exam    -  Primary   Relevant Orders   TSH   Lipid panel   Hemoglobin A1c    -She is getting regular CBC and comprehensive metabolic panel through oncology.  We added TSH, lipid panel, A1c.  She has had several elevated glucoses with previous labs and these were nonfasting. -We strongly encouraged her to get back to regular weightbearing exercise and try to build up slowly as tolerated -Future DEXA scan has been ordered -Continue annual flu vaccine -We discussed her occasional leakage of stool.  We suggest that she consider daily supplement with Citrucel, FiberCon,  or Metamucil and if not seeing results of that be back in touch.  She will also stop stool softener at this time  No orders of the defined types were placed in this encounter.   Follow-up: No follow-ups on file.    Carolann Littler, MD

## 2021-02-23 ENCOUNTER — Other Ambulatory Visit: Payer: Self-pay | Admitting: Family Medicine

## 2021-02-26 ENCOUNTER — Other Ambulatory Visit: Payer: Self-pay | Admitting: Family Medicine

## 2021-02-26 ENCOUNTER — Ambulatory Visit (INDEPENDENT_AMBULATORY_CARE_PROVIDER_SITE_OTHER)
Admission: RE | Admit: 2021-02-26 | Discharge: 2021-02-26 | Disposition: A | Discharge status: Home / Self Care | Payer: Medicare Other | Source: Ambulatory Visit | Attending: Adult Health | Admitting: Adult Health

## 2021-02-26 ENCOUNTER — Other Ambulatory Visit: Payer: Self-pay

## 2021-02-26 DIAGNOSIS — M1990 Unspecified osteoarthritis, unspecified site: Secondary | ICD-10-CM

## 2021-02-27 DIAGNOSIS — J3081 Allergic rhinitis due to animal (cat) (dog) hair and dander: Secondary | ICD-10-CM | POA: Diagnosis not present

## 2021-02-27 DIAGNOSIS — J3089 Other allergic rhinitis: Secondary | ICD-10-CM | POA: Diagnosis not present

## 2021-02-27 DIAGNOSIS — J301 Allergic rhinitis due to pollen: Secondary | ICD-10-CM | POA: Diagnosis not present

## 2021-03-01 ENCOUNTER — Other Ambulatory Visit (HOSPITAL_COMMUNITY): Payer: Medicare Other

## 2021-03-04 ENCOUNTER — Encounter: Payer: Self-pay | Admitting: Adult Health

## 2021-03-05 ENCOUNTER — Telehealth: Payer: Self-pay | Admitting: Licensed Clinical Social Worker

## 2021-03-05 NOTE — Telephone Encounter (Signed)
[place] Work  CSW received notification from RN that patient is interested in completing advanced directives. CSW spoke with patient by phone, explained advanced directives clinic, and signed up patient for 03/15/21 at 1:30pm.   Christeen Douglas, LCSW

## 2021-03-11 NOTE — Progress Notes (Signed)
[facility]  [phone] [phone]) [phone]     ID: Theresa Bradley DOB: [birth year]  MR#: [id number]  PRF#:[id number]  Patient Care Team: [name], MD as PCP - General [name], [name], MD as Consulting Physician (Allergy and Immunology) [name], OD as Referring Physician [name], RN as Oncology Nurse Navigator [name], RN as Oncology Nurse Navigator [name], MD as Consulting Physician (General Surgery) Layia Walla, [name], MD as Consulting Physician (Oncology) [name], MD as Attending Physician (Radiation Oncology) [name], MD as Referring Physician (Dermatology) [name], MD as Consulting Physician (Cardiology) Chauncey Cruel, MD OTHER MD:  CHIEF COMPLAINT: triple positive breast cancer (s/p right mastectomy)  CURRENT TREATMENT: Completing a year of trastuzumab; tamoxifen   INTERVAL HISTORY: Theresa Bradley returns today for follow up and treatment of her triple positive breast cancer.  She continues on trastuzumab.  She is tolerating this with no side effects that she is aware of. Her most recent echocardiogram on 12/14/2020 showing an ejection fraction of 60-65%, which is in line with her baseline.  She is scheduled for repeat echocardiography 03/26/2021  She began tamoxifen on 07/31/2020.  As time passes she tolerates it better and right now vaginal wetness is not an issue and nighttime hot flashes are minimal.  She is very pleased with the way she is doing on this medication  Since her last visit, she underwent bone density screening on 02/26/2021 showing a T-score of -2.0, which is considered osteopenic.  Dr. Elease Bradley has recommended increasing her calcium and also increasing her weightbearing exercise  She is scheduled for left screening mammogram on 03/29/2021.    REVIEW OF SYSTEMS: Theresa Bradley feels well.  She does have constipation which is not a new problem.  She is working on this  through Dr. Elease Bradley and she has stool softeners fiber pills and MiraLAX that she is using.  Aside from this she enjoys her family and spend some time with her sons over the Easter holidays.  A detailed review of systems today was otherwise stable.   COVID 19 VACCINATION STATUS: Status post Pfizer x2+ booster in October 2021   HISTORY OF CURRENT ILLNESS: From the original intake note:   Theresa Bradley was my patient a little over 11 years ago when she underwent lumpectomy on 05/15/2008 for ductal carcinoma in situ and received radiation therapy under Dr. Valere Dross. 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  More recently, she presented to her PCP with a small palpable right breast lump around 11 o'clock, just superior and lateral to the nipple. She underwent bilateral diagnostic mammography with tomography and right breast ultrasonography at [facility] on 12/26/2019 showing: breast density category B; 6 mm superficial mass in the right breast at 11 o'clock in the retroareolar region; no enlarged adenopathy in right axilla.  Accordingly on 12/30/2019 she proceeded to biopsy of the right breast area in question. The pathology from this procedure ([id number]) showed: invasive ductal carcinoma, grade 2. Prognostic indicators significant for: estrogen receptor, 95% positive and progesterone receptor, 80% positive, both with strong staining intensity. Proliferation marker Ki67 at 20%. HER2 positive by immunohistochemistry (3+).  The patient's subsequent history is as detailed below.   PAST MEDICAL HISTORY: Past Medical History:  Diagnosis Date  . Anxiety   . Breast cancer ([place]) 2009   right  .  Cancer ([place])   . COUGH, CHRONIC 05/10/2010  . Dysrhythmia    occationally "skips a beat"  . Family history of brain cancer   . Family history of breast cancer   . Family history of lung cancer   .  Family history of multiple myeloma   . Family history of skin cancer   . GERD (gastroesophageal reflux disease)   . HYPERTENSION 04/16/2009  . HYPOTHYROIDISM 04/16/2009  . OSTEOARTHRITIS 04/16/2009  . Personal history of radiation therapy   . SENILE LENTIGO 06/10/2010    PAST SURGICAL HISTORY: Past Surgical History:  Procedure Laterality Date  . BREAST EXCISIONAL BIOPSY Right   . BREAST EXCISIONAL BIOPSY Left   . BREAST LUMPECTOMY Right 2009   radiation only  . BREAST LUMPECTOMY WITH RADIOACTIVE SEED AND SENTINEL LYMPH NODE BIOPSY Right 01/31/2020   Procedure: RIGHT BREAST LUMPECTOMY WITH RADIOACTIVE SEED AND SENTINEL LYMPH NODE BIOPSY;  Surgeon: [name], MD;  Location: [facility];  Service: General;  Laterality: Right;  PEC BLOCK  . BREAST SURGERY  2009   X 2, cancer stage 0, lumpectomy  . [place]  . CHOLECYSTECTOMY    . DILATATION & CURETTAGE/HYSTEROSCOPY WITH MYOSURE  11/2015  . HERNIA REPAIR  2009  . JOINT REPLACEMENT  2008   hip  . MASTECTOMY Right   . OPEN SURGICAL REPAIR OF GLUTEAL TENDON Left 07/18/2019   Procedure: Left hip bearing surface revision with gluteal tendon repair;  Surgeon: Gaynelle Arabian, MD;  Location: [facility];  Service: Orthopedics;  Laterality: Left;  2 hrs  . PORTACATH PLACEMENT N/A 03/15/2020   Procedure: INSERTION PORT-A-CATH WITH ULTRASOUND GUIDANCE;  Surgeon: [name], MD;  Location: [facility];  Service: General;  Laterality: N/A;  . SIMPLE MASTECTOMY WITH AXILLARY SENTINEL NODE BIOPSY Right 03/15/2020   Procedure: RIGHT SIMPLE MASTECTOMY;  Surgeon: [name], MD;  Location: [facility];  Service: General;  Laterality: Right;    FAMILY HISTORY: Family History  Problem Relation Age of Onset  . Arthritis Other   . Hypertension Other   . Cancer Other        2 aunts  . Hyperlipidemia Father   . Dementia Father   . Skin cancer Father   . Skin cancer Mother   .  Breast cancer Maternal Aunt 63  . Breast cancer Maternal Aunt        dx. mid-70s  . Lung cancer Paternal Uncle   . Multiple myeloma Paternal Uncle   . Hypertension Brother   . Cancer Paternal Aunt        unknown type, dx. >50  . Brain cancer Cousin        dx. 42s, paternal cousin  The patient's father is 51 years old and the patient's mother is 63 years old as of February 2021.  The patient has one brother, no sisters.  A maternal aunt was diagnosed with breast cancer at age 65.  A paternal uncle was diagnosed with lung cancer in his 78s and another paternal uncle with a "blood cancer" in his late 61s.  There is no history of ovarian pancreatic or prostate cancer in the family to her knowledge.   GYNECOLOGIC HISTORY:  No LMP recorded. Patient is postmenopausal. Menarche: 70 years old Age at first live birth: 70 years old [place] P 2 LMP HRT no  Hysterectomy?  No BSO?  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  Her husband Patrick Jupiter runs and environmental company that for example checks for mold and then remediates.  Son Delfino Lovett, 53 years old, lives in [place] and works as a Clinical biochemist.  He has 2 sons.  The patient's son Catalina Antigua 26 lives in [place] and works in Chief Executive Officer.  He married October 2021.  The patient is Episcopalian    ADVANCED DIRECTIVES: In the absence of any documentation to the contrary, the patient's spouse is their HCPOA.    HEALTH MAINTENANCE: Social History   Tobacco Use  . Smoking status: Never Smoker  . Smokeless tobacco: Never Used  . Tobacco comment: father smoked when she was younger   Vaping Use  . Vaping Use: Never used  Substance Use Topics  . Alcohol use: Yes    Comment: occasionally   . Drug use: No     Colonoscopy: 03/2009/[facility]  PAP: 10/2013, negative  Bone density: 11/2015, T score -1.7   Allergies  Allergen Reactions  .  Codeine Sulfate Nausea And Vomiting  . Erythromycin Base Other (See Comments)    Stomach ache  . Esomeprazole Magnesium     REACTION: GI upset    Current Outpatient Medications  Medication Sig Dispense Refill  . ALPRAZolam (XANAX) 0.25 MG tablet TAKE 1/2 A TABLET BY MOUTH AT BEDTIME AS NEEDED FOR INSOMNIA 45 tablet 0  . amLODipine (NORVASC) 5 MG tablet TAKE 1 TABLET BY MOUTH EVERY DAY 90 tablet 3  . BIOTIN 5000 PO Take by mouth.    . Calcium Carbonate-Vitamin D 600-200 MG-UNIT TABS Take 1 tablet by mouth daily.    . citalopram (CELEXA) 20 MG tablet TAKE 1 TABLET BY MOUTH EVERY DAY 90 tablet 3  . docusate sodium (COLACE) 100 MG capsule Take 100 mg by mouth daily as needed for mild constipation.     Marland Kitchen EPIPEN 2-PAK 0.3 MG/0.3ML SOAJ injection Inject 0.3 mg into the muscle as needed for anaphylaxis.     . famotidine (PEPCID) 10 MG tablet Take 10 mg by mouth daily.     Marland Kitchen gabapentin (NEURONTIN) 300 MG capsule Take 1 capsule (300 mg total) by mouth at bedtime. 90 capsule 4  . levothyroxine (SYNTHROID) 50 MCG tablet TAKE 1 TABLET BY MOUTH EVERY DAY 90 tablet 0  . meloxicam (MOBIC) 15 MG tablet TAKE 1 TABLET BY MOUTH EVERY DAY 90 tablet 1  . tamoxifen (NOLVADEX) 20 MG tablet Take 20 mg by mouth daily.    Marland Kitchen tretinoin (RETIN-A) 0.025 % cream Apply topically at bedtime. 45 g 6   No current facility-administered medications for this visit.    OBJECTIVE: White woman in no acute distress  Vitals:   03/12/21 1136  BP: (!) 143/50  Pulse: (!) 58  Resp: 18  Temp: (!) 97.3 F (36.3 C)  SpO2: 100%     Body mass index is 26.18 kg/m.   Wt Readings from Last 3 Encounters:  03/12/21 152 lb 8 oz (69.2 kg)  02/22/21 150 lb 4.8 oz (68.2 kg)  02/19/21 152 lb (68.9 kg)     ECOG FS:1 - Symptomatic but completely ambulatory  Sclerae unicteric, EOMs intact Wearing a mask No cervical or supraclavicular adenopathy Lungs no rales or rhonchi Heart regular rate and rhythm Abd soft, nontender, positive  bowel sounds MSK no focal spinal tenderness, no upper extremity lymphedema Neuro: nonfocal, well oriented, appropriate affect Breasts: The right breast has undergone mastectomy.  There is no evidence  of chest wall recurrence.  The left breast and both axillae are benign.   LAB RESULTS:  CMP     Component Value Date/Time   NA 142 03/12/2021 1107   K 3.7 03/12/2021 1107   CL 104 03/12/2021 1107   CO2 28 03/12/2021 1107   GLUCOSE 143 (H) 03/12/2021 1107   BUN 13 03/12/2021 1107   CREATININE 0.99 03/12/2021 1107   CREATININE 0.81 01/11/2020 1209   CALCIUM 9.1 03/12/2021 1107   PROT 7.1 03/12/2021 1107   ALBUMIN 3.6 03/12/2021 1107   AST 14 (L) 03/12/2021 1107   AST 14 (L) 01/11/2020 1209   ALT 8 03/12/2021 1107   ALT 13 01/11/2020 1209   ALKPHOS 41 03/12/2021 1107   BILITOT 0.5 03/12/2021 1107   BILITOT 0.5 01/11/2020 1209   GFRNONAA >60 03/12/2021 1107   GFRNONAA >60 01/11/2020 1209   GFRAA >60 08/14/2020 1227   GFRAA >60 01/11/2020 1209    No results found for: TOTALPROTELP, ALBUMINELP, A1GS, A2GS, BETS, BETA2SER, GAMS, MSPIKE, SPEI  Lab Results  Component Value Date   WBC 5.9 03/12/2021   NEUTROABS 3.4 03/12/2021   HGB 12.9 03/12/2021   HCT 39.1 03/12/2021   MCV 89.5 03/12/2021   PLT 254 03/12/2021    No results found for: LABCA2  No components found for: SPQZRA076  No results for input(s): INR in the last 168 hours.  No results found for: LABCA2  No results found for: AUQ333  No results found for: LKT625  No results found for: WLS937  No results found for: CA2729  No components found for: HGQUANT  No results found for: CEA1 / No results found for: CEA1   No results found for: AFPTUMOR  No results found for: CHROMOGRNA  No results found for: KPAFRELGTCHN, LAMBDASER, KAPLAMBRATIO (kappa/lambda light chains)  No results found for: HGBA, HGBA2QUANT, HGBFQUANT, HGBSQUAN (Hemoglobinopathy evaluation)   No results found for: LDH  No results  found for: IRON, TIBC, IRONPCTSAT (Iron and TIBC)  No results found for: FERRITIN  Urinalysis    Component Value Date/Time   COLORURINE yellow 05/24/2010 0855   APPEARANCEUR Clear 05/24/2010 0855   LABSPEC 1.015 05/24/2010 0855   PHURINE 6.0 05/24/2010 0855   GLUCOSEU NEGATIVE 09/22/2007 1110   HGBUR negative 05/24/2010 0855   BILIRUBINUR n 06/29/2019 1533   KETONESUR NEGATIVE 09/22/2007 1110   PROTEINUR Negative 06/29/2019 1533   PROTEINUR NEGATIVE 09/22/2007 1110   UROBILINOGEN 0.2 06/29/2019 1533   UROBILINOGEN 0.2 05/24/2010 0855   NITRITE n 06/29/2019 1533   NITRITE negative 05/24/2010 0855   LEUKOCYTESUR Negative 06/29/2019 1533    STUDIES: DG Bone Density  Result Date: 03/04/2021 Date of study: 02/26/2021 Exam: DUAL X-RAY ABSORPTIOMETRY (DXA) FOR BONE MINERAL DENSITY (BMD) Instrument: [employer] Requesting Provider: PCP Indication: follow up for low BMD Comparison: 11/28/2015 Clinical data: Pt is a 70 y.o. female without previous history of fracture. On calcium and vitamin D.  On tamoxifen. Results:  Lumbar spine L1-L4 Femoral neck (FN) T-score -1.2 RFN: -2.0 LFN: n/a Change in BMD from previous DXA test (%)  +1.0%  -4.8% (*) statistically significant Assessment: the BMD is low according to the [facility] classification for osteoporosis (see below). Fracture risk: moderate FRAX score: 10 year major osteoporotic risk: 12.5%. 10 year hip fracture risk: 2.6%. The thresholds for treatment are 20% and 3%, respectively. Comments: the technical quality of the study is good.  The left hip could not be analyzed due to previous surgery. Evaluation for secondary causes  should be considered if clinically indicated. Recommend optimizing calcium (1200 mg/day) and vitamin D (800 IU/day) intake. Followup: Repeat BMD is appropriate after 2 years. WHO criteria for diagnosis of osteoporosis in postmenopausal women and in men 71 y/o or older: - normal: T-score -1.0 to + 1.0 - osteopenia/low bone  density: T-score between -2.5 and -1.0 - osteoporosis: T-score below -2.5 - severe osteoporosis: T-score below -2.5 with history of fragility fracture Note: although not part of the WHO classification, the presence of a fragility fracture, regardless of the T-score, should be considered diagnostic of osteoporosis, provided other causes for the fracture have been excluded. Treatment: The National Osteoporosis Foundation recommends that treatment be considered in postmenopausal women and men age 23 or older with: 1. Hip or vertebral (clinical or morphometric) fracture 2. T-score of - 2.5 or lower at the spine or hip 3. 10-year fracture probability by FRAX of at least 20% for a major osteoporotic fracture and 3% for a hip fracture Philemon Kingdom, MD [facility]     ELIGIBLE FOR AVAILABLE RESEARCH PROTOCOL: no  ASSESSMENT: 70 y.o. [place] woman status post right breast periareolar biopsy 12/30/2019 for a clinical T1a N0, stage I invasive ductal carcinoma, grade 2, estrogen and progesterone receptor positive, HER-2 amplified, with an MIB-one of 20%.  (1) history of prior right lumpectomy June 2009 for ductal carcinoma in situ  (a) status post adjuvant radiation  (b) did not receive antiestrogens  (2) status post repeat right lumpectomy and sentinel lymph node sampling 01/31/2020 for a pT1c pN0, stage IA invasive ductal carcinoma, grade 2, with positive margins  (a) total one sentinel node removed  (3) s/p right mastectomy 03/15/2020 showing residual invasive ductal carcinoma measuring 0.9 cm, but negative margins (added to prior 1.1 lumpectomy, still T1 N0 or stage IA)  (a) one additional axillary node was removed (total 2 right axillary nodes removed)  (3) adjuvant chemotherapy with paclitaxel and trastuzumab weekly x 12 started 04/24/2020, completing 10 doses 06/26/2020   (a) final 2 doses omitted secondary to peripheral neuropathy  (4) trastuzumab to be continued every 21 days to  total one year (through May 2022).  (a) echo 01/17/2020 shows an ejection fraction in the 60-65% range  (b) echo 05/30/2020 shows an ejection fraction of 60-65%.  (c) echo 08/28/2020 shows an ejection fraction in the 55-60% range  (d) echo 12/14/2020 shows an ejection fraction in the 60-65% range.  (5) started tamoxifen 07/31/2020  (a) Bone density in 11/2015 shows osteopenia with T score of -1.7.  (b) bone density 03/04/2021 shows a T score of -2.0  (6) genetics testing 02/01/2020 through the [facility] Breast [facility] STAT panel found no deleterious mutations then ATM, BRCA1, BRCA2, CDH1, CHEK2, PALB2, PTEN, STK11 and TP53.    PLAN: Latrica is now a year out from definitive surgery for her breast cancer with no evidence of disease recurrence.  This is favorable.  She is tolerating tamoxifen very well at present and the plan is to continue that a minimum of 5 years.  She will complete her trastuzumab on 04/02/2021.  She has an echo before that.  After that she can have her port removed at any time and I have alerted her surgeon Dr. Brantley Stage regarding that  I have strongly encouraged her to walk between 30 and 6mnutes 5 days a week if possible.  Otherwise she just saw her primary care physician and sees him yearly around this time a year so she will see me again in October and we  will start yearly visits from that point  Total encounter time 25 minutes.*  Total encounter time 25 minutes.Sarajane Jews C. Jamaiya Tunnell, MD 03/12/21 12:01 PM Medical Oncology and Hematology [facility] [place], [place] [address] Tel. [phone]    Fax. [phone]   I, Wilburn Mylar, am acting as scribe for Dr. [name]. Emslee Lopezmartinez.  I, Lurline Del MD, have reviewed the above documentation for accuracy and completeness, and I agree with the above.   *Total Encounter Time as defined by the Centers for Medicare and Medicaid Services includes, in addition to the face-to-face  time of a patient visit (documented in the note above) non-face-to-face time: obtaining and reviewing outside history, ordering and reviewing medications, tests or procedures, care coordination (communications with other health care professionals or caregivers) and documentation in the medical record.

## 2021-03-12 ENCOUNTER — Inpatient Hospital Stay: Payer: Medicare Other | Attending: Oncology

## 2021-03-12 ENCOUNTER — Inpatient Hospital Stay: Payer: Medicare Other

## 2021-03-12 ENCOUNTER — Inpatient Hospital Stay: Payer: Medicare Other | Admitting: Oncology

## 2021-03-12 ENCOUNTER — Other Ambulatory Visit: Payer: Self-pay

## 2021-03-12 VITALS — BP 143/50 | HR 58 | Temp 97.3°F | Resp 18 | Ht 64.0 in | Wt 152.5 lb

## 2021-03-12 VITALS — HR 65

## 2021-03-12 DIAGNOSIS — Z5112 Encounter for antineoplastic immunotherapy: Secondary | ICD-10-CM | POA: Diagnosis not present

## 2021-03-12 DIAGNOSIS — Z95828 Presence of other vascular implants and grafts: Secondary | ICD-10-CM

## 2021-03-12 DIAGNOSIS — C50411 Malignant neoplasm of upper-outer quadrant of right female breast: Secondary | ICD-10-CM | POA: Diagnosis not present

## 2021-03-12 DIAGNOSIS — C50111 Malignant neoplasm of central portion of right female breast: Secondary | ICD-10-CM | POA: Diagnosis not present

## 2021-03-12 DIAGNOSIS — Z170 Estrogen receptor positive status [ER+]: Secondary | ICD-10-CM

## 2021-03-12 DIAGNOSIS — Z79899 Other long term (current) drug therapy: Secondary | ICD-10-CM | POA: Insufficient documentation

## 2021-03-12 LAB — CBC WITH DIFFERENTIAL/PLATELET
Abs Immature Granulocytes: 0.01 10*3/uL (ref 0.00–0.07)
Basophils Absolute: 0.1 10*3/uL (ref 0.0–0.1)
Basophils Relative: 1 %
Eosinophils Absolute: 0.3 10*3/uL (ref 0.0–0.5)
Eosinophils Relative: 5 %
HCT: 39.1 % (ref 36.0–46.0)
Hemoglobin: 12.9 g/dL (ref 12.0–15.0)
Immature Granulocytes: 0 %
Lymphocytes Relative: 28 %
Lymphs Abs: 1.6 10*3/uL (ref 0.7–4.0)
MCH: 29.5 pg (ref 26.0–34.0)
MCHC: 33 g/dL (ref 30.0–36.0)
MCV: 89.5 fL (ref 80.0–100.0)
Monocytes Absolute: 0.5 10*3/uL (ref 0.1–1.0)
Monocytes Relative: 8 %
Neutro Abs: 3.4 10*3/uL (ref 1.7–7.7)
Neutrophils Relative %: 58 %
Platelets: 254 10*3/uL (ref 150–400)
RBC: 4.37 MIL/uL (ref 3.87–5.11)
RDW: 12.2 % (ref 11.5–15.5)
WBC: 5.9 10*3/uL (ref 4.0–10.5)
nRBC: 0 % (ref 0.0–0.2)

## 2021-03-12 LAB — COMPREHENSIVE METABOLIC PANEL
ALT: 8 U/L (ref 0–44)
AST: 14 U/L — ABNORMAL LOW (ref 15–41)
Albumin: 3.6 g/dL (ref 3.5–5.0)
Alkaline Phosphatase: 41 U/L (ref 38–126)
Anion gap: 10 (ref 5–15)
BUN: 13 mg/dL (ref 8–23)
CO2: 28 mmol/L (ref 22–32)
Calcium: 9.1 mg/dL (ref 8.9–10.3)
Chloride: 104 mmol/L (ref 98–111)
Creatinine, Ser: 0.99 mg/dL (ref 0.44–1.00)
GFR, Estimated: >60 mL/min (ref >60)
Glucose, Bld: 143 mg/dL — ABNORMAL HIGH (ref 70–99)
Potassium: 3.7 mmol/L (ref 3.5–5.1)
Sodium: 142 mmol/L (ref 135–145)
Total Bilirubin: 0.5 mg/dL (ref 0.3–1.2)
Total Protein: 7.1 g/dL (ref 6.5–8.1)

## 2021-03-12 MED ORDER — DIPHENHYDRAMINE HCL 12.5 MG/5ML PO ELIX
12.5000 mg | ORAL_SOLUTION | Freq: Once | ORAL | Status: AC
  Administered 2021-03-12: 12.5 mg via ORAL

## 2021-03-12 MED ORDER — DIPHENHYDRAMINE HCL 12.5 MG/5ML PO ELIX
ORAL_SOLUTION | ORAL | Status: AC
  Filled 2021-03-12: qty 5

## 2021-03-12 MED ORDER — SODIUM CHLORIDE 0.9% FLUSH
10.0000 mL | INTRAVENOUS | Status: DC | PRN
  Administered 2021-03-12: 10 mL
  Filled 2021-03-12: qty 10

## 2021-03-12 MED ORDER — SODIUM CHLORIDE 0.9 % IV SOLN
Freq: Once | INTRAVENOUS | Status: AC
Start: 2021-03-12 — End: 2021-03-12
  Filled 2021-03-12: qty 250

## 2021-03-12 MED ORDER — TRASTUZUMAB-DKST CHEMO 150 MG IV SOLR
6.0000 mg/kg | Freq: Once | INTRAVENOUS | Status: AC
  Administered 2021-03-12: 399 mg via INTRAVENOUS
  Filled 2021-03-12: qty 19

## 2021-03-12 MED ORDER — SODIUM CHLORIDE 0.9% FLUSH
10.0000 mL | Freq: Once | INTRAVENOUS | Status: AC
  Administered 2021-03-12: 10 mL
  Filled 2021-03-12: qty 10

## 2021-03-12 MED ORDER — ACETAMINOPHEN 325 MG PO TABS
650.0000 mg | ORAL_TABLET | Freq: Once | ORAL | Status: AC
Start: 2021-03-12 — End: 2021-03-12
  Administered 2021-03-12: 650 mg via ORAL

## 2021-03-12 MED ORDER — ACETAMINOPHEN 325 MG PO TABS
ORAL_TABLET | ORAL | Status: AC
  Filled 2021-03-12: qty 2

## 2021-03-12 MED ORDER — HEPARIN SOD (PORK) LOCK FLUSH 100 UNIT/ML IV SOLN
500.0000 [IU] | Freq: Once | INTRAVENOUS | Status: AC | PRN
  Administered 2021-03-12: 500 [IU]
  Filled 2021-03-12: qty 5

## 2021-03-13 DIAGNOSIS — J3089 Other allergic rhinitis: Secondary | ICD-10-CM | POA: Diagnosis not present

## 2021-03-13 DIAGNOSIS — J301 Allergic rhinitis due to pollen: Secondary | ICD-10-CM | POA: Diagnosis not present

## 2021-03-13 DIAGNOSIS — J3081 Allergic rhinitis due to animal (cat) (dog) hair and dander: Secondary | ICD-10-CM | POA: Diagnosis not present

## 2021-03-14 ENCOUNTER — Telehealth: Payer: Self-pay | Admitting: Oncology

## 2021-03-14 NOTE — Telephone Encounter (Signed)
Scheduled per 4/19 los. Called and spoke with pt, confirmed 10/25 appts

## 2021-03-15 ENCOUNTER — Other Ambulatory Visit: Payer: Self-pay

## 2021-03-15 ENCOUNTER — Inpatient Hospital Stay: Payer: Medicare Other | Admitting: General Practice

## 2021-03-15 DIAGNOSIS — C50411 Malignant neoplasm of upper-outer quadrant of right female breast: Secondary | ICD-10-CM

## 2021-03-15 DIAGNOSIS — Z170 Estrogen receptor positive status [ER+]: Secondary | ICD-10-CM

## 2021-03-15 NOTE — Progress Notes (Signed)
[facility]  Clinical Social Work was referred by Advance directives Clinic  to review and complete healthcare advance directives.  Clinical Social Worker and chaplain met with patient in [employer].  The patient designated Richard "Patrick Jupiter" Hassinger as their primary healthcare agent and Leeroy Bock Tish Begin as their secondary agent.  Patient also completed healthcare living will.    Clinical Social Worker notarized documents and made copies for patient/family. Clinical Social Worker will send documents to medical records to be scanned into patient's chart. Clinical Social Worker encouraged patient/family to contact with any additional questions or concerns.  Edwyna Shell, LCSW Clinical Social Worker Phone:  [phone]

## 2021-03-20 ENCOUNTER — Other Ambulatory Visit: Payer: Self-pay | Admitting: Family Medicine

## 2021-03-26 ENCOUNTER — Ambulatory Visit (HOSPITAL_COMMUNITY)
Admission: RE | Admit: 2021-03-26 | Discharge: 2021-03-26 | Disposition: A | Discharge status: Home / Self Care | Payer: Medicare Other | Source: Ambulatory Visit | Attending: Oncology | Admitting: Oncology

## 2021-03-26 ENCOUNTER — Other Ambulatory Visit: Payer: Self-pay

## 2021-03-26 DIAGNOSIS — E039 Hypothyroidism, unspecified: Secondary | ICD-10-CM | POA: Diagnosis not present

## 2021-03-26 DIAGNOSIS — C50411 Malignant neoplasm of upper-outer quadrant of right female breast: Secondary | ICD-10-CM | POA: Diagnosis not present

## 2021-03-26 DIAGNOSIS — C439 Malignant melanoma of skin, unspecified: Secondary | ICD-10-CM | POA: Diagnosis not present

## 2021-03-26 DIAGNOSIS — Z01818 Encounter for other preprocedural examination: Secondary | ICD-10-CM | POA: Insufficient documentation

## 2021-03-26 DIAGNOSIS — I119 Hypertensive heart disease without heart failure: Secondary | ICD-10-CM | POA: Diagnosis not present

## 2021-03-26 DIAGNOSIS — Z170 Estrogen receptor positive status [ER+]: Secondary | ICD-10-CM | POA: Diagnosis not present

## 2021-03-26 DIAGNOSIS — Z0189 Encounter for other specified special examinations: Secondary | ICD-10-CM | POA: Diagnosis not present

## 2021-03-26 LAB — ECHOCARDIOGRAM COMPLETE
Area-P 1/2: 3.53 cm2
S' Lateral: 2.8 cm

## 2021-03-26 NOTE — Progress Notes (Signed)
  Echocardiogram 2D Echocardiogram has been performed.  Theresa Bradley 03/26/2021, 12:09 PM

## 2021-03-27 ENCOUNTER — Encounter: Payer: Self-pay | Admitting: Adult Health

## 2021-03-27 DIAGNOSIS — J3089 Other allergic rhinitis: Secondary | ICD-10-CM | POA: Diagnosis not present

## 2021-03-27 DIAGNOSIS — J301 Allergic rhinitis due to pollen: Secondary | ICD-10-CM | POA: Diagnosis not present

## 2021-03-27 DIAGNOSIS — J3081 Allergic rhinitis due to animal (cat) (dog) hair and dander: Secondary | ICD-10-CM | POA: Diagnosis not present

## 2021-03-29 ENCOUNTER — Other Ambulatory Visit: Payer: Self-pay | Admitting: Adult Health

## 2021-03-29 ENCOUNTER — Other Ambulatory Visit: Payer: Self-pay

## 2021-03-29 ENCOUNTER — Ambulatory Visit
Admission: RE | Admit: 2021-03-29 | Discharge: 2021-03-29 | Disposition: A | Discharge status: Home / Self Care | Payer: Medicare Other | Source: Ambulatory Visit | Attending: Oncology | Admitting: Oncology

## 2021-03-29 DIAGNOSIS — Z170 Estrogen receptor positive status [ER+]: Secondary | ICD-10-CM

## 2021-03-29 DIAGNOSIS — Z1231 Encounter for screening mammogram for malignant neoplasm of breast: Secondary | ICD-10-CM | POA: Diagnosis not present

## 2021-03-29 DIAGNOSIS — C50411 Malignant neoplasm of upper-outer quadrant of right female breast: Secondary | ICD-10-CM

## 2021-03-29 DIAGNOSIS — C439 Malignant melanoma of skin, unspecified: Secondary | ICD-10-CM

## 2021-03-29 NOTE — Progress Notes (Signed)
Patient and I reviewed her echo results.  Her GLS is worsening, and I recommended she see Dr. Aundra Dubin or Dr. Haroldine Laws in our heart failure clinic.    She notes her cough is changing.  The cough seems deeper and more wet.  She thought is was related to her allergies, and is concerned it could be her heart.  She denies any swelling, and notes her weight is stable.    I placed a referral to cardiology and recommended she see Dr. Jana Hakim prior to her appointment 5/10 to ensure she can receive her final Trastuzumab.    Wilber Bihari, NP

## 2021-04-01 ENCOUNTER — Telehealth: Payer: Self-pay | Admitting: Oncology

## 2021-04-01 ENCOUNTER — Encounter: Payer: Self-pay | Admitting: *Deleted

## 2021-04-01 NOTE — Telephone Encounter (Signed)
Rescheduled appointment per 05/08 schedule message. Patient is aware.

## 2021-04-01 NOTE — Telephone Encounter (Signed)
Scheduled appts per 5/6 sch msg. Pt aware.  

## 2021-04-02 ENCOUNTER — Ambulatory Visit: Payer: Medicare Other

## 2021-04-02 ENCOUNTER — Other Ambulatory Visit: Payer: Medicare Other

## 2021-04-02 ENCOUNTER — Ambulatory Visit: Payer: Medicare Other | Admitting: Oncology

## 2021-04-04 ENCOUNTER — Inpatient Hospital Stay: Payer: Medicare Other | Attending: Oncology | Admitting: Oncology

## 2021-04-04 ENCOUNTER — Other Ambulatory Visit: Payer: Self-pay | Admitting: Oncology

## 2021-04-04 DIAGNOSIS — C50411 Malignant neoplasm of upper-outer quadrant of right female breast: Secondary | ICD-10-CM

## 2021-04-04 DIAGNOSIS — Z170 Estrogen receptor positive status [ER+]: Secondary | ICD-10-CM | POA: Diagnosis not present

## 2021-04-04 NOTE — Progress Notes (Signed)
[facility]  Telephone:([phone] Fax:([phone]) [phone]     ID: Theresa Bradley DOB: August 09, 1951  MR#: [id number]  MOQ#:[id number]  Patient Care Team: [name], MD as PCP - General [name], [name], MD as Consulting Physician (Allergy and Immunology) [name], OD as Referring Physician [name], RN as Oncology Nurse Navigator [name], RN as Oncology Nurse Navigator [name], MD as Consulting Physician (General Surgery) [name], [name], MD as Consulting Physician (Oncology) [name], MD as Attending Physician (Radiation Oncology) [name], MD as Referring Physician (Dermatology) [name], MD as Consulting Physician (Cardiology) Chauncey Cruel, MD OTHER MD:  I connected with Theresa Bradley on 04/04/21 at 12:15 PM EDT by telephone visit and verified that I am speaking with the correct person using two identifiers.   I discussed the limitations, risks, security and privacy concerns of performing an evaluation and management service by telemedicine and the availability of in-person appointments. I also discussed with the patient that there may be a patient responsible charge related to this service. The patient expressed understanding and agreed to proceed.   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  She was supposed to have received her final dose of trastuzumab today.  However she underwent repeat echocardiogram on 03/26/2021 showing an ejection fraction of 55%. Her GLS is also worsening. She was referred to the CHF clinic on  03/29/2021, but this appointment has not yet been made.  We decided to hold her final treatment until after that visit  She began tamoxifen on 07/31/2020.  As time passes she tolerates it better and right now vaginal wetness is not an issue and nighttime hot flashes are minimal.  She is very pleased with the way she is doing on this medication  Since her last visit, she underwent left screening mammography with tomography at [facility] on 03/29/2021 showing: breast density category B; no evidence of malignancy.     REVIEW OF SYSTEMS: Theresa Bradley is exercising by walking.  She has a chronic cough which she says is now a little bit deeper, not quite like a cold.  She has had no fever and she has not tested herself for COVID.  She does have a history of reflux but is taking famotidine and currently is not having reflux symptoms.   COVID 19 VACCINATION STATUS: Status post Pfizer x2+ booster in October 2021   HISTORY OF CURRENT ILLNESS: From the original intake note:   NYIMA VANACKER was my patient a little over 11 years ago when she underwent lumpectomy on 05/15/2008 for ductal carcinoma in situ and received radiation therapy under Dr. Valere Dross. 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  More recently, she presented to her PCP with a small palpable right breast lump around 11 o'clock, just superior and lateral to the nipple. She underwent bilateral diagnostic mammography with tomography and right breast ultrasonography at [facility] on 12/26/2019 showing: breast density category B; 6 mm superficial mass in the right  breast at 11 o'clock in the retroareolar region; no enlarged adenopathy in right axilla.  Accordingly on 12/30/2019 she proceeded to biopsy of the right breast area in question. The pathology from this procedure ([id number]) showed: invasive ductal carcinoma, grade 2. Prognostic indicators  significant for: estrogen receptor, 95% positive and progesterone receptor, 80% positive, both with strong staining intensity. Proliferation marker Ki67 at 20%. HER2 positive by immunohistochemistry (3+).  The patient's subsequent history is as detailed below.   PAST MEDICAL HISTORY: Past Medical History:  Diagnosis Date  . Anxiety   . Breast cancer ([place]) 2009   right  . Cancer ([place])   . COUGH, CHRONIC 05/10/2010  . Dysrhythmia    occationally "skips a beat"  . Family history of brain cancer   . Family history of breast cancer   . Family history of lung cancer   . Family history of multiple myeloma   . Family history of skin cancer   . GERD (gastroesophageal reflux disease)   . HYPERTENSION 04/16/2009  . HYPOTHYROIDISM 04/16/2009  . OSTEOARTHRITIS 04/16/2009  . Personal history of radiation therapy   . SENILE LENTIGO 06/10/2010    PAST SURGICAL HISTORY: Past Surgical History:  Procedure Laterality Date  . BREAST EXCISIONAL BIOPSY Right   . BREAST EXCISIONAL BIOPSY Left   . BREAST LUMPECTOMY Right 2009   radiation only  . BREAST LUMPECTOMY WITH RADIOACTIVE SEED AND SENTINEL LYMPH NODE BIOPSY Right 01/31/2020   Procedure: RIGHT BREAST LUMPECTOMY WITH RADIOACTIVE SEED AND SENTINEL LYMPH NODE BIOPSY;  Surgeon: [name], MD;  Location: [facility];  Service: General;  Laterality: Right;  PEC BLOCK  . BREAST SURGERY  2009   X 2, cancer stage 0, lumpectomy  . [place]  . CHOLECYSTECTOMY    . DILATATION & CURETTAGE/HYSTEROSCOPY WITH MYOSURE  11/2015  . HERNIA REPAIR  2009  . JOINT REPLACEMENT  2008   hip  . MASTECTOMY Right   . OPEN SURGICAL REPAIR OF GLUTEAL TENDON Left 07/18/2019   Procedure: Left hip bearing surface revision with gluteal tendon repair;  Surgeon: Gaynelle Arabian, MD;  Location: [facility];  Service: Orthopedics;  Laterality: Left;  2 hrs  . PORTACATH PLACEMENT N/A 03/15/2020   Procedure: INSERTION PORT-A-CATH WITH ULTRASOUND  GUIDANCE;  Surgeon: [name], MD;  Location: [facility];  Service: General;  Laterality: N/A;  . SIMPLE MASTECTOMY WITH AXILLARY SENTINEL NODE BIOPSY Right 03/15/2020   Procedure: RIGHT SIMPLE MASTECTOMY;  Surgeon: [name], MD;  Location: [facility];  Service: General;  Laterality: Right;    FAMILY HISTORY: Family History  Problem Relation Age of Onset  . Arthritis Other   . Hypertension Other   . Cancer Other        2 aunts  . Hyperlipidemia Father   . Dementia Father   . Skin cancer Father   . Skin cancer Mother   . Breast cancer Maternal Aunt 63  . Breast cancer Maternal Aunt        dx. mid-70s  . Lung cancer Paternal Uncle   . Multiple myeloma Paternal Uncle   . Hypertension Brother   . Cancer Paternal Aunt        unknown type, dx. >50  . Brain cancer Cousin        dx. 73s, paternal cousin  The patient's father is 53 years old and the patient's mother is 50 years old as of February 2021.  The patient has one  brother, no sisters.  A maternal aunt was diagnosed with breast cancer at age 85.  A paternal uncle was diagnosed with lung cancer in his 74s and another paternal uncle with a "blood cancer" in his late 50s.  There is no history of ovarian pancreatic or prostate cancer in the family to her knowledge.   GYNECOLOGIC HISTORY:  No LMP recorded. Patient is postmenopausal. Menarche: 70 years old Age at first live birth: 70 years old [place] P 2 LMP HRT no  Hysterectomy?  No BSO?  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  Her husband Patrick Jupiter runs and environmental company that for example checks for mold and then remediates.  Son Delfino Lovett, 46 years old, lives in [place] and works as a Clinical biochemist.  He has 2 sons.  The patient's son Catalina Antigua 38 lives in [place] and works in Chief Executive Officer.  He married October 2021.  The patient  is Episcopalian    ADVANCED DIRECTIVES: In the absence of any documentation to the contrary, the patient's spouse is their HCPOA.    HEALTH MAINTENANCE: Social History   Tobacco Use  . Smoking status: Never Smoker  . Smokeless tobacco: Never Used  . Tobacco comment: father smoked when she was younger   Vaping Use  . Vaping Use: Never used  Substance Use Topics  . Alcohol use: Yes    Comment: occasionally   . Drug use: No     Colonoscopy: 03/2009/[facility]  PAP: 10/2013, negative  Bone density: 11/2015, T score -1.7   Allergies  Allergen Reactions  . Codeine Sulfate Nausea And Vomiting  . Erythromycin Base Other (See Comments)    Stomach ache  . Esomeprazole Magnesium     REACTION: GI upset    Current Outpatient Medications  Medication Sig Dispense Refill  . ALPRAZolam (XANAX) 0.25 MG tablet TAKE 1/2 A TABLET BY MOUTH AT BEDTIME AS NEEDED FOR INSOMNIA 45 tablet 0  . amLODipine (NORVASC) 5 MG tablet TAKE 1 TABLET BY MOUTH EVERY DAY 90 tablet 3  . BIOTIN 5000 PO Take by mouth.    . Calcium Carbonate-Vitamin D 600-200 MG-UNIT TABS Take 1 tablet by mouth daily.    . citalopram (CELEXA) 20 MG tablet TAKE 1 TABLET BY MOUTH EVERY DAY 90 tablet 3  . docusate sodium (COLACE) 100 MG capsule Take 100 mg by mouth daily as needed for mild constipation.     Marland Kitchen EPIPEN 2-PAK 0.3 MG/0.3ML SOAJ injection Inject 0.3 mg into the muscle as needed for anaphylaxis.     . famotidine (PEPCID) 10 MG tablet Take 10 mg by mouth daily.     Marland Kitchen gabapentin (NEURONTIN) 300 MG capsule Take 1 capsule (300 mg total) by mouth at bedtime. 90 capsule 4  . levothyroxine (SYNTHROID) 50 MCG tablet TAKE 1 TABLET BY MOUTH EVERY DAY 90 tablet 0  . meloxicam (MOBIC) 15 MG tablet TAKE 1 TABLET BY MOUTH EVERY DAY 90 tablet 1  . tamoxifen (NOLVADEX) 20 MG tablet Take 20 mg by mouth daily.    Marland Kitchen tretinoin (RETIN-A) 0.025 % cream Apply topically at bedtime. 45 g 6   No current facility-administered medications for this  visit.    OBJECTIVE: White woman in no acute distress  There were no vitals filed for this visit.   There is no height or weight on file to calculate BMI.   Wt Readings from Last 3 Encounters:  03/12/21 152 lb 8 oz (69.2 kg)  02/22/21 150 lb 4.8 oz (68.2 kg)  02/19/21 152 lb (68.9 kg)     ECOG FS:1 - Symptomatic but completely ambulatory  Telemedicine visit 04/04/2021   LAB RESULTS:  CMP     Component Value Date/Time   NA 142 03/12/2021 1107   K 3.7 03/12/2021 1107   CL 104 03/12/2021 1107   CO2 28 03/12/2021 1107   GLUCOSE 143 (H) 03/12/2021 1107   BUN 13 03/12/2021 1107   CREATININE 0.99 03/12/2021 1107   CREATININE 0.81 01/11/2020 1209   CALCIUM 9.1 03/12/2021 1107   PROT 7.1 03/12/2021 1107   ALBUMIN 3.6 03/12/2021 1107   AST 14 (L) 03/12/2021 1107   AST 14 (L) 01/11/2020 1209   ALT 8 03/12/2021 1107   ALT 13 01/11/2020 1209   ALKPHOS 41 03/12/2021 1107   BILITOT 0.5 03/12/2021 1107   BILITOT 0.5 01/11/2020 1209   GFRNONAA >60 03/12/2021 1107   GFRNONAA >60 01/11/2020 1209   GFRAA >60 08/14/2020 1227   GFRAA >60 01/11/2020 1209    No results found for: TOTALPROTELP, ALBUMINELP, A1GS, A2GS, BETS, BETA2SER, GAMS, MSPIKE, SPEI  Lab Results  Component Value Date   WBC 5.9 03/12/2021   NEUTROABS 3.4 03/12/2021   HGB 12.9 03/12/2021   HCT 39.1 03/12/2021   MCV 89.5 03/12/2021   PLT 254 03/12/2021    No results found for: LABCA2  No components found for: BZJIRC789  No results for input(s): INR in the last 168 hours.  No results found for: LABCA2  No results found for: FYB017  No results found for: PZW258  No results found for: NID782  No results found for: CA2729  No components found for: HGQUANT  No results found for: CEA1 / No results found for: CEA1   No results found for: AFPTUMOR  No results found for: CHROMOGRNA  No results found for: KPAFRELGTCHN, LAMBDASER, KAPLAMBRATIO (kappa/lambda light chains)  No results found for: HGBA,  HGBA2QUANT, HGBFQUANT, HGBSQUAN (Hemoglobinopathy evaluation)   No results found for: LDH  No results found for: IRON, TIBC, IRONPCTSAT (Iron and TIBC)  No results found for: FERRITIN  Urinalysis    Component Value Date/Time   COLORURINE yellow 05/24/2010 0855   APPEARANCEUR Clear 05/24/2010 0855   LABSPEC 1.015 05/24/2010 0855   PHURINE 6.0 05/24/2010 0855   GLUCOSEU NEGATIVE 09/22/2007 1110   HGBUR negative 05/24/2010 0855   BILIRUBINUR n 06/29/2019 1533   KETONESUR NEGATIVE 09/22/2007 1110   PROTEINUR Negative 06/29/2019 1533   PROTEINUR NEGATIVE 09/22/2007 1110   UROBILINOGEN 0.2 06/29/2019 1533   UROBILINOGEN 0.2 05/24/2010 0855   NITRITE n 06/29/2019 1533   NITRITE negative 05/24/2010 0855   LEUKOCYTESUR Negative 06/29/2019 1533    STUDIES: MM 3D SCREEN BREAST UNI LEFT  Result Date: 03/29/2021 CLINICAL DATA:  Screening. EXAM: DIGITAL SCREENING UNILATERAL LEFT MAMMOGRAM WITH CAD AND TOMOSYNTHESIS TECHNIQUE: Left screening digital craniocaudal and mediolateral oblique mammograms were obtained. Left screening digital breast tomosynthesis was performed. The images were evaluated with computer-aided detection. COMPARISON:  Previous exam(s). ACR Breast Density Category b: There are scattered areas of fibroglandular density. FINDINGS: The patient has had a right mastectomy. There are no findings suspicious for malignancy. IMPRESSION: No mammographic evidence of malignancy. A result letter of this screening mammogram will be mailed directly to the patient. RECOMMENDATION: Screening mammogram in one year.  (Code:SM-L-55M) BI-RADS CATEGORY  1: Negative. Electronically Signed   By: Audie Pinto M.D.   On: 03/29/2021 15:07  ECHOCARDIOGRAM COMPLETE  Result Date: 03/26/2021    ECHOCARDIOGRAM REPORT   Patient Name:   Theresa Bradley Date of Exam: 03/26/2021 Medical Rec #:  [id number]       Height:       64.0 in Accession #:    [id number]      Weight:       152.5 lb Date of Birth:   1951/05/27        BSA:          1.743 m Patient Age:    51 years        BP:           147/79 mmHg Patient Gender: F               HR:           53 bpm. Exam Location:  Outpatient Procedure: 2D Echo, 3D Echo, Cardiac Doppler, Color Doppler and Strain Analysis Indications:    Z51.11 Encounter for antineoplastic chemotheraphy  History:        Patient has prior history of Echocardiogram examinations, most                 recent 12/14/2020. Risk Factors:Hypertension. Breast Cancer.                 Hypothyroidism. GERD.  Sonographer:    Jonelle Sidle Dance Referring Phys: [place]  1. Inferior basal hypokinesis Abnormal GLS -13.7 This has decreased from -16.6 on echo 12/14/20 Visually side by side EF appears the same Suggest f/u echo 3 months . Left ventricular ejection fraction, by estimation, is 55%. The left ventricle has normal  function. The left ventricle demonstrates regional wall motion abnormalities (see scoring diagram/findings for description). There is mild asymmetric left ventricular hypertrophy of the basal and septal segments. Left ventricular diastolic parameters were normal.  2. Right ventricular systolic function is normal. The right ventricular size is normal. There is normal pulmonary artery systolic pressure.  3. Left atrial size was mildly dilated.  4. The mitral valve is degenerative. Trivial mitral valve regurgitation. No evidence of mitral stenosis. Severe mitral annular calcification.  5. The aortic valve is tricuspid. There is moderate calcification of the aortic valve. Aortic valve regurgitation is not visualized. Mild to moderate aortic valve sclerosis/calcification is present, without any evidence of aortic stenosis.  6. The inferior vena cava is normal in size with greater than 50% respiratory variability, suggesting right atrial pressure of 3 mmHg. FINDINGS  Left Ventricle: Inferior basal hypokinesis Abnormal GLS -13.7 This has decreased from -16.6 on echo 12/14/20 Visually  side by side EF appears the same Suggest f/u echo 3 months. Left ventricular ejection fraction, by estimation, is 55%. The left ventricle has normal function. The left ventricle demonstrates regional wall motion abnormalities. The left ventricular internal cavity size was normal in size. There is mild asymmetric left ventricular hypertrophy of the basal and septal segments. Left ventricular diastolic parameters were normal. Right Ventricle: The right ventricular size is normal. No increase in right ventricular wall thickness. Right ventricular systolic function is normal. There is normal pulmonary artery systolic pressure. The tricuspid regurgitant velocity is 1.87 m/s, and  with an assumed right atrial pressure of 3 mmHg, the estimated right ventricular systolic pressure is 82.9 mmHg. Left Atrium: Left atrial size was mildly dilated. Right Atrium: Right atrial size was normal in size. Pericardium: There is no evidence of pericardial effusion. Mitral Valve: The mitral valve is degenerative in appearance. There is  moderate thickening of the mitral valve leaflet(s). There is moderate calcification of the mitral valve leaflet(s). Severe mitral annular calcification. Trivial mitral valve regurgitation. No evidence of mitral valve stenosis. Tricuspid Valve: The tricuspid valve is normal in structure. Tricuspid valve regurgitation is mild . No evidence of tricuspid stenosis. Aortic Valve: The aortic valve is tricuspid. There is moderate calcification of the aortic valve. Aortic valve regurgitation is not visualized. Mild to moderate aortic valve sclerosis/calcification is present, without any evidence of aortic stenosis. Pulmonic Valve: The pulmonic valve was normal in structure. Pulmonic valve regurgitation is trivial. No evidence of pulmonic stenosis. Aorta: The aortic root is normal in size and structure. Venous: The inferior vena cava is normal in size with greater than 50% respiratory variability, suggesting right  atrial pressure of 3 mmHg. IAS/Shunts: No atrial level shunt detected by color flow Doppler.  LEFT VENTRICLE PLAX 2D LVIDd:         3.90 cm  Diastology LVIDs:         2.80 cm  LV e' medial:    4.79 cm/s LV PW:         1.20 cm  LV E/e' medial:  12.7 LV IVS:        1.20 cm  LV e' lateral:   6.09 cm/s LVOT diam:     2.20 cm  LV E/e' lateral: 10.0 LV SV:         68 LV SV Index:   39 LVOT Area:     3.80 cm  RIGHT VENTRICLE            IVC RV Basal diam:  2.90 cm    IVC diam: 1.40 cm RV S prime:     9.90 cm/s TAPSE (M-mode): 1.8 cm LEFT ATRIUM             Index       RIGHT ATRIUM           Index LA diam:        4.10 cm 2.35 cm/m  RA Area:     11.90 cm LA Vol (A2C):   54.5 ml 31.26 ml/m RA Volume:   24.70 ml  14.17 ml/m LA Vol (A4C):   44.4 ml 25.47 ml/m LA Biplane Vol: 49.7 ml 28.51 ml/m  AORTIC VALVE LVOT Vmax:   75.20 cm/s LVOT Vmean:  56.500 cm/s LVOT VTI:    0.180 m  AORTA Ao Root diam: 3.20 cm Ao Asc diam:  3.30 cm MITRAL VALVE               TRICUSPID VALVE MV Area (PHT): 3.53 cm    TR Peak grad:   14.0 mmHg MV Decel Time: 215 msec    TR Vmax:        187.00 cm/s MV E velocity: 60.90 cm/s MV A velocity: 83.20 cm/s  SHUNTS MV E/A ratio:  0.73        Systemic VTI:  0.18 m                            Systemic Diam: 2.20 cm Jenkins Rouge MD Electronically signed by Jenkins Rouge MD Signature Date/Time: 03/26/2021/12:25:32 PM    Final      ELIGIBLE FOR AVAILABLE RESEARCH PROTOCOL: no  ASSESSMENT: 71 y.o. [place] woman status post right breast periareolar biopsy 12/30/2019 for a clinical T1a N0, stage I invasive ductal carcinoma, grade 2, estrogen and progesterone receptor positive, HER-2 amplified, with an MIB-one  of 20%.  (1) history of prior right lumpectomy June 2009 for ductal carcinoma in situ  (a) status post adjuvant radiation  (b) did not receive antiestrogens  (2) status post repeat right lumpectomy and sentinel lymph node sampling 01/31/2020 for a pT1c pN0, stage IA invasive ductal carcinoma,  grade 2, with positive margins  (a) total one sentinel node removed  (3) s/p right mastectomy 03/15/2020 showing residual invasive ductal carcinoma measuring 0.9 cm, but negative margins (added to prior 1.1 lumpectomy, still T1 N0 or stage IA)  (a) one additional axillary node was removed (total 2 right axillary nodes removed)  (3) adjuvant chemotherapy with paclitaxel and trastuzumab weekly x 12 started 04/24/2020, completing 10 doses 06/26/2020   (a) final 2 doses omitted secondary to peripheral neuropathy  (4) trastuzumab to be continued every 21 days to total one year (through May 2022).  (a) echo 01/17/2020 shows an ejection fraction in the 60-65% range  (b) echo 05/30/2020 shows an ejection fraction of 60-65%.  (c) echo 08/28/2020 shows an ejection fraction in the 55-60% range  (d) echo 12/14/2020 shows an ejection fraction in the 60-65% range.  (e) echo 03/26/2021 shows an EF in the 55% range with decreased GLS  (5) started tamoxifen 07/31/2020  (a) Bone density in 11/2015 shows osteopenia with T score of -1.7.  (b) bone density 03/04/2021 shows a T score of -2.0  (6) genetics testing 02/01/2020 through the [facility] STAT panel found no deleterious mutations then ATM, BRCA1, BRCA2, CDH1, CHEK2, PALB2, PTEN, STK11 and TP53.    PLAN: Keyshawna is now just over a year out from definitive surgery for breast cancer with no evidence of disease recurrence.  This is very favorable.  I am comfortable simply omitting her final trastuzumab dose.  We have data for much prefer duration of tamoxifen treatment although the standard still is for 1year.  She understands the weakening of the heart muscle due to trastuzumab is usually transitory and she likely will be set up for repeat echocardiogram by cardiology at some point in the future to document that.  Otherwise the plan is to continue tamoxifen.  She just had her mammography last week.  I will see her again in about 6 months.  She  knows to call for any other issue that may develop before then.   [name]. Duane Trias, MD 04/04/21 12:13 PM Medical Oncology and Hematology [facility] [place], [place] [address] Tel. [phone]    Fax. [phone]   I, Wilburn Mylar, am acting as scribe for Dr. [name]. Natsumi Whitsitt.  I, Lurline Del MD, have reviewed the above documentation for accuracy and completeness, and I agree with the above.   *Total Encounter Time as defined by the Centers for Medicare and Medicaid Services includes, in addition to the face-to-face time of a patient visit (documented in the note above) non-face-to-face time: obtaining and reviewing outside history, ordering and reviewing medications, tests or procedures, care coordination (communications with other health care professionals or caregivers) and documentation in the medical record.

## 2021-04-10 DIAGNOSIS — J3081 Allergic rhinitis due to animal (cat) (dog) hair and dander: Secondary | ICD-10-CM | POA: Diagnosis not present

## 2021-04-10 DIAGNOSIS — J3089 Other allergic rhinitis: Secondary | ICD-10-CM | POA: Diagnosis not present

## 2021-04-10 DIAGNOSIS — J301 Allergic rhinitis due to pollen: Secondary | ICD-10-CM | POA: Diagnosis not present

## 2021-04-11 ENCOUNTER — Telehealth: Payer: Self-pay | Admitting: Oncology

## 2021-04-11 NOTE — Telephone Encounter (Signed)
Scheduled appointment per 05/12 los. Patient is aware.  

## 2021-04-12 ENCOUNTER — Ambulatory Visit (INDEPENDENT_AMBULATORY_CARE_PROVIDER_SITE_OTHER): Payer: Medicare Other

## 2021-04-12 ENCOUNTER — Ambulatory Visit (INDEPENDENT_AMBULATORY_CARE_PROVIDER_SITE_OTHER): Payer: Medicare Other | Admitting: Family Medicine

## 2021-04-12 ENCOUNTER — Other Ambulatory Visit: Payer: Self-pay

## 2021-04-12 ENCOUNTER — Encounter: Payer: Self-pay | Admitting: Family Medicine

## 2021-04-12 VITALS — BP 136/74 | HR 57 | Temp 98.0°F | Ht 64.0 in | Wt 153.0 lb

## 2021-04-12 DIAGNOSIS — R053 Chronic cough: Secondary | ICD-10-CM

## 2021-04-12 DIAGNOSIS — R059 Cough, unspecified: Secondary | ICD-10-CM | POA: Diagnosis not present

## 2021-04-12 MED ORDER — BENZONATATE 100 MG PO CAPS: 100.0000 mg | ORAL_CAPSULE | Freq: Three times a day (TID) | ORAL | 0 refills | Status: DC | PRN

## 2021-04-12 NOTE — Patient Instructions (Signed)

## 2021-04-12 NOTE — Progress Notes (Signed)
Established Patient Office Visit  Subjective:  Patient ID: Theresa Bradley, female    DOB: February 19, 1951  Age: 70 y.o. MRN: [id number]  CC:  Chief Complaint  Patient presents with  . Cough    HPI Theresa Bradley presents for at least 1 month history of worsening cough.  She does have background history of chronic cough really intermittently for years.  She states that over the past month or so she feels that she has had some loose mucus occasionally.  Occasionally colored mucus.  No hemoptysis.  No fevers or chills.  Denies any postnasal drip symptoms.  No GERD symptoms.  No ACE inhibitor use.  No obvious wheezing.  She has seen pulmonary in the past for her chronic cough.  Other medical history significant for hypertension, hypothyroidism, past history of melanoma, breast cancer  Past Medical History:  Diagnosis Date  . Anxiety   . Breast cancer ([place]) 2009   right  . Cancer ([place])   . COUGH, CHRONIC 05/10/2010  . Dysrhythmia    occationally "skips a beat"  . Family history of brain cancer   . Family history of breast cancer   . Family history of lung cancer   . Family history of multiple myeloma   . Family history of skin cancer   . GERD (gastroesophageal reflux disease)   . HYPERTENSION 04/16/2009  . HYPOTHYROIDISM 04/16/2009  . OSTEOARTHRITIS 04/16/2009  . Personal history of radiation therapy   . SENILE LENTIGO 06/10/2010    Past Surgical History:  Procedure Laterality Date  . BREAST EXCISIONAL BIOPSY Right   . BREAST EXCISIONAL BIOPSY Left   . BREAST LUMPECTOMY Right 2009   radiation only  . BREAST LUMPECTOMY WITH RADIOACTIVE SEED AND SENTINEL LYMPH NODE BIOPSY Right 01/31/2020   Procedure: RIGHT BREAST LUMPECTOMY WITH RADIOACTIVE SEED AND SENTINEL LYMPH NODE BIOPSY;  Surgeon: [name], MD;  Location: [facility];  Service: General;  Laterality: Right;  PEC BLOCK  . BREAST SURGERY  2009   X 2, cancer stage 0, lumpectomy  . [place]  . CHOLECYSTECTOMY    . DILATATION & CURETTAGE/HYSTEROSCOPY WITH MYOSURE  11/2015  . HERNIA REPAIR  2009  . JOINT REPLACEMENT  2008   hip  . MASTECTOMY Right   . OPEN SURGICAL REPAIR OF GLUTEAL TENDON Left 07/18/2019   Procedure: Left hip bearing surface revision with gluteal tendon repair;  Surgeon: Gaynelle Arabian, MD;  Location: [facility];  Service: Orthopedics;  Laterality: Left;  2 hrs  . PORTACATH PLACEMENT N/A 03/15/2020   Procedure: INSERTION PORT-A-CATH WITH ULTRASOUND GUIDANCE;  Surgeon: [name], MD;  Location: [facility];  Service: General;  Laterality: N/A;  . SIMPLE MASTECTOMY WITH AXILLARY SENTINEL NODE BIOPSY Right 03/15/2020   Procedure: RIGHT SIMPLE MASTECTOMY;  Surgeon: [name], MD;  Location: [facility];  Service: General;  Laterality: Right;    Family History  Problem Relation Age of Onset  . Arthritis Other   . Hypertension Other   . Cancer Other        2 aunts  . Hyperlipidemia Father   . Dementia Father   . Skin cancer Father   . Skin cancer Mother   . Breast cancer Maternal Aunt 63  . Breast cancer Maternal Aunt        dx. mid-70s  . Lung cancer Paternal Uncle   . Multiple myeloma Paternal Uncle   . Hypertension Brother   .  Cancer Paternal Aunt        unknown type, dx. >50  . Brain cancer Cousin        dx. 26s, paternal cousin    Social History   Socioeconomic History  . Marital status: Married    Spouse name: Not on file  . Number of children: 2  . Years of education: Not on file  . Highest education level: Not on file  Occupational History  . Occupation: Cabin crew    CommentAssociate Professor  . 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 Smoking status: Never Smoker  . Smokeless tobacco: Never Used  . Tobacco comment: father smoked when she was younger   Vaping Use  . Vaping Use: Never used  Substance and Sexual Activity  . Alcohol use: Yes     Comment: occasionally   . Drug use: No  . Sexual activity: Not on file  Other Topics Concern  . Not on file  Social History Narrative   Lives with husband on 2 level house, has two sons, one local with grandkids   Works FT as Cabin crew; Psychiatrist   Enjoys working out at gym about 2X/week, attends silver sneakers classes   Dad struggles with dementia, mother still living who is caretaker; occasionally travels to visit to assist mother.   Social Determinants of Health   Financial Resource Strain: High Risk  . Difficulty of Paying Living Expenses: Very hard  Food Insecurity: No Food Insecurity  . Worried About Charity fundraiser in the Last Year: Never true  . Ran Out of Food in the Last Year: Never true  Transportation Needs: No Transportation Needs  . Lack of Transportation (Medical): No  . Lack of Transportation (Non-Medical): No  Physical Activity: Inactive  . Days of Exercise per Week: 0 days  . Minutes of Exercise per Session: 0 min  Stress: No Stress Concern Present  . Feeling of Stress : Not at all  Social Connections: Moderately Integrated  . Frequency of Communication with Friends and Family: More than three times a week  . Frequency of Social Gatherings with Friends and Family: Twice a week  . Attends Religious Services: Never  . Active Member of Clubs or Organizations: Yes  . Attends Archivist Meetings: 1 to 4 times per year  . Marital Status: Married  Human resources officer Violence: Not At Risk  . Fear of Current or Ex-Partner: No  . Emotionally Abused: No  . Physically Abused: No  . Sexually Abused: No    Outpatient Medications Prior to Visit  Medication Sig Dispense Refill  . ALPRAZolam (XANAX) 0.25 MG tablet TAKE 1/2 A TABLET BY MOUTH AT BEDTIME AS NEEDED FOR INSOMNIA 45 tablet 0  . amLODipine (NORVASC) 5 MG tablet TAKE 1 TABLET BY MOUTH EVERY DAY 90 tablet 3  . BIOTIN 5000 PO Take by mouth.    . Calcium Carbonate-Vitamin D 600-200 MG-UNIT TABS Take 1  tablet by mouth daily.    . citalopram (CELEXA) 20 MG tablet TAKE 1 TABLET BY MOUTH EVERY DAY 90 tablet 3  . EPIPEN 2-PAK 0.3 MG/0.3ML SOAJ injection Inject 0.3 mg into the muscle as needed for anaphylaxis.     . famotidine (PEPCID) 10 MG tablet Take 10 mg by mouth daily.     Marland Kitchen gabapentin (NEURONTIN) 300 MG capsule Take 1 capsule (300 mg total) by mouth at bedtime. 90 capsule 4  . levothyroxine (SYNTHROID)  50 MCG tablet TAKE 1 TABLET BY MOUTH EVERY DAY 90 tablet 0  . meloxicam (MOBIC) 15 MG tablet TAKE 1 TABLET BY MOUTH EVERY DAY 90 tablet 1  . tamoxifen (NOLVADEX) 20 MG tablet Take 20 mg by mouth daily.    . tretinoin (RETIN-A) 0.025 % cream Apply topically at bedtime. 45 g 6  . docusate sodium (COLACE) 100 MG capsule Take 100 mg by mouth daily as needed for mild constipation.      No facility-administered medications prior to visit.    Allergies  Allergen Reactions  . Codeine Sulfate Nausea And Vomiting  . Erythromycin Base Other (See Comments)    Stomach ache  . Esomeprazole Magnesium     REACTION: GI upset    ROS Review of Systems  Constitutional: Negative for chills and fever.  HENT: Negative for congestion and postnasal drip.   Respiratory: Positive for cough. Negative for shortness of breath and wheezing.   Cardiovascular: Negative for chest pain.      Objective:    Physical Exam Vitals reviewed.  Constitutional:      Appearance: Normal appearance.  Cardiovascular:     Rate and Rhythm: Normal rate and regular rhythm.  Pulmonary:     Effort: Pulmonary effort is normal.     Breath sounds: Normal breath sounds. No wheezing or rales.  Musculoskeletal:     Right lower leg: No edema.     Left lower leg: No edema.  Lymphadenopathy:     Cervical: No cervical adenopathy.  Neurological:     Mental Status: She is alert.     BP 136/74   Pulse (!) 57   Temp 98 F (36.7 C) (Oral)   Ht 5' 4" (1.626 m)   Wt 153 lb (69.4 kg)   SpO2 94%   BMI 26.26 kg/m  Wt Readings  from Last 3 Encounters:  04/12/21 153 lb (69.4 kg)  03/12/21 152 lb 8 oz (69.2 kg)  02/22/21 150 lb 4.8 oz (68.2 kg)     Health Maintenance Due  Topic Date Due  . TETANUS/TDAP  06/10/2020  . COVID-19 Vaccine (4 - Booster for Pfizer series) 08/25/2020    There are no preventive care reminders to display for this patient.  Lab Results  Component Value Date   TSH 3.82 02/22/2021   Lab Results  Component Value Date   WBC 5.9 03/12/2021   HGB 12.9 03/12/2021   HCT 39.1 03/12/2021   MCV 89.5 03/12/2021   PLT 254 03/12/2021   Lab Results  Component Value Date   NA 142 03/12/2021   K 3.7 03/12/2021   CO2 28 03/12/2021   GLUCOSE 143 (H) 03/12/2021   BUN 13 03/12/2021   CREATININE 0.99 03/12/2021   BILITOT 0.5 03/12/2021   ALKPHOS 41 03/12/2021   AST 14 (L) 03/12/2021   ALT 8 03/12/2021   PROT 7.1 03/12/2021   ALBUMIN 3.6 03/12/2021   CALCIUM 9.1 03/12/2021   ANIONGAP 10 03/12/2021   GFR 80.08 06/29/2019   Lab Results  Component Value Date   CHOL 201 (H) 02/22/2021   Lab Results  Component Value Date   HDL 44.30 02/22/2021   Lab Results  Component Value Date   LDLCALC 129 (H) 02/22/2021   Lab Results  Component Value Date   TRIG 142.0 02/22/2021   Lab Results  Component Value Date   CHOLHDL 5 02/22/2021   Lab Results  Component Value Date   HGBA1C 5.7 02/22/2021      Assessment &   Plan:   Problem List Items Addressed This Visit   None   Visit Diagnoses    Chronic cough    -  Primary   Relevant Orders   DG Chest 2 View     -She has nonfocal exam but given duration of cough we will get PA and lateral chest x-ray -Tessalon Perles 100 mg every 8 hours as needed for cough -We explore etiologies for chronic cough such as GERD, postnasal drip, unrecognized reactive airway issues and patient denies any the above.  No ACE inhibitor use.  Meds ordered this encounter  Medications  . benzonatate (TESSALON PERLES) 100 MG capsule    Sig: Take 1 capsule  (100 mg total) by mouth 3 (three) times daily as needed for cough.    Dispense:  30 capsule    Refill:  0    Follow-up: No follow-ups on file.    Carolann Littler, MD

## 2021-04-18 ENCOUNTER — Encounter: Payer: Self-pay | Admitting: Adult Health

## 2021-04-19 ENCOUNTER — Telehealth (HOSPITAL_COMMUNITY): Payer: Self-pay | Admitting: *Deleted

## 2021-04-19 NOTE — Telephone Encounter (Signed)
Dr.[name]'s office called following up on referral to our office placed on 5/6. 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 No preference on provider. Pt has not been seen by our clinic. Last echo 5/3.   Routed to [employer]

## 2021-04-24 DIAGNOSIS — J3081 Allergic rhinitis due to animal (cat) (dog) hair and dander: Secondary | ICD-10-CM | POA: Diagnosis not present

## 2021-04-24 DIAGNOSIS — J301 Allergic rhinitis due to pollen: Secondary | ICD-10-CM | POA: Diagnosis not present

## 2021-04-24 DIAGNOSIS — J3089 Other allergic rhinitis: Secondary | ICD-10-CM | POA: Diagnosis not present

## 2021-04-29 ENCOUNTER — Telehealth (HOSPITAL_COMMUNITY): Payer: Self-pay | Admitting: Vascular Surgery

## 2021-04-29 NOTE — Telephone Encounter (Signed)
Left pt message to make new ASAP brst appt, 6/8 8:40 am w. DM/ 6/16 9:20 WITH DB

## 2021-05-08 ENCOUNTER — Other Ambulatory Visit: Payer: Self-pay | Admitting: Family Medicine

## 2021-05-08 DIAGNOSIS — J3089 Other allergic rhinitis: Secondary | ICD-10-CM | POA: Diagnosis not present

## 2021-05-08 DIAGNOSIS — J301 Allergic rhinitis due to pollen: Secondary | ICD-10-CM | POA: Diagnosis not present

## 2021-05-08 DIAGNOSIS — J3081 Allergic rhinitis due to animal (cat) (dog) hair and dander: Secondary | ICD-10-CM | POA: Diagnosis not present

## 2021-05-08 NOTE — Progress Notes (Signed)
CARDIO-ONCOLOGY CLINIC CONSULT NOTE  Referring Physician: Primary Care: Primary Cardiologist:  HPI:  Ms. Grout is 70 y.o. female with HTN right breast cancer referred by Dr. Jana Hakim for enrollment into the Cardio-Oncology program due to reduced EF on most recent echo.   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 In 8/20 had melanoma on R breast and was excised.   In 2/21 found to have recurrent right breast CA - triple positive.   Breast CA History  (1) status post repeat right lumpectomy and sentinel lymph node sampling 01/31/2020 for a pT1c pN0, stage IA invasive ductal carcinoma, grade 2, with positive margins             (a) total one sentinel node removed   (2) s/p right mastectomy 03/15/2020 showing residual invasive ductal carcinoma measuring 0.9 cm, but negative margins (added to prior 1.1 lumpectomy, still T1 N0 or stage IA)             (a) one additional axillary node was removed (total 2 right axillary nodes removed)   (3) adjuvant chemotherapy with paclitaxel and trastuzumab weekly x 12 started 04/24/2020, completing 10 doses 06/26/2020             (a) final 2 doses omitted secondary to peripheral neuropathy   (4) trastuzumab to be continued every 21 days to total one year (through May 2022).             (a) echo 01/17/2020 shows an ejection fraction in the 60-65% range             (b) echo 05/30/2020 shows an ejection fraction of 60-65%.             (c) echo 08/28/2020 shows an ejection fraction in the 55-60% range             (d) echo 12/14/2020 shows an ejection fraction in the 60-65% range.             (e) echo 03/26/2021 shows an EF in the 55% range with decreased GLS  Herceptin stopped in April due to abnormal echo after 8/12 doses. Feels fine. No CP or SOB.  Not overly active.   Review of Systems: [y] = yes, [ ] = no   General: Weight gain [ ]; Weight loss [ ]; Anorexia [ ]; Fatigue [ ]; Fever [ ]; Chills [ ]; Weakness [ ]  Cardiac:  Chest pain/pressure [ ]; Resting SOB [ ]; Exertional SOB [ ]; Orthopnea [ ]; Pedal Edema [ ]; Palpitations [ ]; Syncope [ ]; Presyncope [ ]; Paroxysmal nocturnal dyspnea[ ]  Pulmonary: Cough [ ]; Wheezing[ ]; Hemoptysis[ ]; Sputum [ ]; Snoring [ ]  GI: Vomiting[ ]; Dysphagia[ ]; Melena[ ]; Hematochezia [ ]; Heartburn[ ]; Abdominal pain [ ]; Constipation [ ]; Diarrhea [ ]; BRBPR [ ]  GU: Hematuria[ ]; Dysuria [ ]; Nocturia[ ]  Vascular: Pain in legs with walking [ ]; Pain in feet with lying flat [ ]; Non-healing sores [ ]; Stroke [ ]; TIA [ ]; Slurred speech [ ];  Neuro: Headaches[ ]; Vertigo[ ]; Seizures[ ]; Paresthesias[ ];Blurred vision [ ]; Diplopia [ ]; Vision changes [ ]  Ortho/Skin: Arthritis [ y]; Joint pain [ y]; Muscle pain [ ]; Joint swelling [ ]; Back Pain [ ]; Rash [ ]  Psych: Depression[ ]; Anxiety[ y]  Heme: Bleeding problems [ ]; Clotting disorders [ ]; Anemia [ ]  Endocrine: Diabetes [ ];  Thyroid dysfunction[ y]   Past Medical History:  Diagnosis Date   Anxiety    Breast cancer ([place]) 2009   right   Cancer ([place])    COUGH, CHRONIC 05/10/2010   Dysrhythmia    occationally "skips a beat"   Family history of brain cancer    Family history of breast cancer    Family history of lung cancer    Family history of multiple myeloma    Family history of skin cancer    GERD (gastroesophageal reflux disease)    HYPERTENSION 04/16/2009   HYPOTHYROIDISM 04/16/2009   OSTEOARTHRITIS 04/16/2009   Personal history of radiation therapy    SENILE LENTIGO 06/10/2010    Current Outpatient Medications  Medication Sig Dispense Refill   ALPRAZolam (XANAX) 0.25 MG tablet TAKE 1/2 A TABLET BY MOUTH AT BEDTIME AS NEEDED FOR INSOMNIA 45 tablet 0   amLODipine (NORVASC) 5 MG tablet TAKE 1 TABLET BY MOUTH EVERY DAY 90 tablet 3   benzonatate (TESSALON PERLES) 100 MG capsule Take 1 capsule (100 mg total) by mouth 3 (three) times daily as needed for cough. 30 capsule 0   BIOTIN 5000 PO Take by mouth.      CALCIUM-VITAMIN D PO Take 2 tablets by mouth daily.     citalopram (CELEXA) 20 MG tablet TAKE 1 TABLET BY MOUTH EVERY DAY 90 tablet 3   EPIPEN 2-PAK 0.3 MG/0.3ML SOAJ injection Inject 0.3 mg into the muscle as needed for anaphylaxis.      famotidine (PEPCID) 10 MG tablet Take 10 mg by mouth daily.      gabapentin (NEURONTIN) 300 MG capsule Take 1 capsule (300 mg total) by mouth at bedtime. 90 capsule 4   levothyroxine (SYNTHROID) 50 MCG tablet TAKE 1 TABLET BY MOUTH EVERY DAY 90 tablet 0   meloxicam (MOBIC) 15 MG tablet TAKE 1 TABLET BY MOUTH EVERY DAY 90 tablet 1   tamoxifen (NOLVADEX) 20 MG tablet Take 20 mg by mouth daily.     tretinoin (RETIN-A) 0.025 % cream Apply topically at bedtime. 45 g 6   No current facility-administered medications for this encounter.    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  Tobacco Use   Smoking status: Never   Smokeless tobacco: Never   Tobacco comments:    father smoked when she was younger   Scientific laboratory technician Use: Never used  Substance and Sexual Activity   Alcohol use: Yes    Comment: occasionally    Drug use: No   Sexual activity: Not on file  Other Topics Concern   Not on file  Social History Narrative   Lives with husband on 2 level house, has two sons, one local with grandkids   Works FT as Cabin crew; Psychiatrist   Enjoys working out at gym about 2X/week, attends silver sneakers classes   Dad struggles with dementia, mother still living who is  caretaker;  occasionally travels to visit to assist mother.   Social Determinants of Health   Financial Resource Strain: High Risk   Difficulty of Paying Living Expenses: Very hard  Food Insecurity: No Food Insecurity   Worried About Charity fundraiser in the Last Year: Never true   Ran Out of Food in the Last Year: Never true  Transportation Needs: No Transportation Needs   Lack of Transportation (Medical): No   Lack of Transportation (Non-Medical): No  Physical Activity: Inactive   Days of Exercise per Week: 0 days   Minutes of Exercise per Session: 0 min  Stress: No Stress Concern Present   Feeling of Stress : Not at all  Social Connections: Moderately Integrated   Frequency of Communication with Friends and Family: More than three times a week   Frequency of Social Gatherings with Friends and Family: Twice a week   Attends Religious Services: Never   Marine scientist or Organizations: Yes   Attends Music therapist: 1 to 4 times per year   Marital Status: Married  Human resources officer Violence: Not At Risk   Fear of Current or Ex-Partner: No   Emotionally Abused: No   Physically Abused: No   Sexually Abused: No      Family History  Problem Relation Age of Onset   Arthritis Other    Hypertension Other    Cancer Other        2 aunts   Hyperlipidemia Father    Dementia Father    Skin cancer Father    Skin cancer Mother    Breast cancer Maternal Aunt 63   Breast cancer Maternal Aunt        dx. mid-70s   Lung cancer Paternal Uncle    Multiple myeloma Paternal Uncle    Hypertension Brother    Cancer Paternal Aunt        unknown type, dx. >50   Brain cancer Cousin        dx. 28s, paternal cousin    Vitals:   05/09/21 0953  BP: 122/74  Pulse: (!) 56  SpO2: 96%  Weight: 68.3 kg (150 lb 9.6 oz)    PHYSICAL EXAM: General:  Well appearing. No respiratory difficulty HEENT: normal Neck: supple. no JVD. Carotids 2+ bilat; no bruits. No lymphadenopathy or  thryomegaly appreciated. Cor: PMI nondisplaced. Regular rate & rhythm. No rubs, gallops or murmurs. Lungs: clear Abdomen: soft, nontender, nondistended. No hepatosplenomegaly. No bruits or masses. Good bowel sounds. Extremities: no cyanosis, clubbing, rash, edema Neuro: alert & oriented x 3, cranial nerves grossly intact. moves all 4 extremities w/o difficulty. Affect pleasant.  ECG: Sinus brady nonspecific T wave abnormality Personally reviewed    ASSESSMENT & PLAN:  1. Right Breast Cancer - On herceptin since 6/21 - Echos as above.  - I have reviewed most recent echo and compared it to previous echos. Most recent echo in 5/22 EF 55%. May be slightly down from previous but still in normal range. GLS likely underestimated due to poor endocardial tracking. Timeline is also not c/w Herceptin cardiotoxicity (usually evident in first 3 months).  - She has been off Herceptin nearly 2 months. At this point, I would advocate for resuming Herceptin and given 3 more doses and then repeat echo in 2 months prior to final dose. - Explained incidence of Herceptin cardiotoxicity and role of Cardio-oncology clinic at length.   2. HTN - Blood pressure well controlled. Continue current regimen.    Quillian Quince  Bensimhon, MD  10:12 AM

## 2021-05-09 ENCOUNTER — Ambulatory Visit (HOSPITAL_COMMUNITY)
Admission: RE | Admit: 2021-05-09 | Discharge: 2021-05-09 | Disposition: A | Discharge status: Home / Self Care | Payer: Medicare Other | Source: Ambulatory Visit | Attending: Internal Medicine | Admitting: Internal Medicine

## 2021-05-09 ENCOUNTER — Encounter (HOSPITAL_COMMUNITY): Payer: Self-pay | Admitting: Internal Medicine

## 2021-05-09 ENCOUNTER — Other Ambulatory Visit: Payer: Self-pay

## 2021-05-09 ENCOUNTER — Other Ambulatory Visit: Payer: Self-pay | Admitting: Oncology

## 2021-05-09 VITALS — BP 122/74 | HR 56 | Wt 150.6 lb

## 2021-05-09 DIAGNOSIS — Z7901 Long term (current) use of anticoagulants: Secondary | ICD-10-CM | POA: Insufficient documentation

## 2021-05-09 DIAGNOSIS — Z170 Estrogen receptor positive status [ER+]: Secondary | ICD-10-CM | POA: Insufficient documentation

## 2021-05-09 DIAGNOSIS — I10 Essential (primary) hypertension: Secondary | ICD-10-CM | POA: Insufficient documentation

## 2021-05-09 DIAGNOSIS — Z9011 Acquired absence of right breast and nipple: Secondary | ICD-10-CM | POA: Insufficient documentation

## 2021-05-09 DIAGNOSIS — Z803 Family history of malignant neoplasm of breast: Secondary | ICD-10-CM | POA: Diagnosis not present

## 2021-05-09 DIAGNOSIS — R931 Abnormal findings on diagnostic imaging of heart and coronary circulation: Secondary | ICD-10-CM

## 2021-05-09 DIAGNOSIS — Z923 Personal history of irradiation: Secondary | ICD-10-CM | POA: Diagnosis not present

## 2021-05-09 DIAGNOSIS — C50411 Malignant neoplasm of upper-outer quadrant of right female breast: Secondary | ICD-10-CM | POA: Diagnosis not present

## 2021-05-09 NOTE — Patient Instructions (Addendum)
EKG done today.   No Labs done today.   No medication changes were made. Please continue all current medications as prescribed.  Your physician recommends that you schedule a follow-up appointment in: 2 months with an echo prior to your exam.   If you have any questions or concerns before your next appointment please send [place] a message through [facility] or call our office at [phone].    TO LEAVE A MESSAGE FOR THE NURSE SELECT OPTION 2, PLEASE LEAVE A MESSAGE INCLUDING: YOUR NAME DATE OF BIRTH CALL BACK NUMBER REASON FOR CALL**this is important as we prioritize the call backs  YOU WILL RECEIVE A CALL BACK THE SAME DAY AS LONG AS YOU CALL BEFORE 4:00 PM   Do the following things EVERYDAY: Weigh yourself in the morning before breakfast. Write it down and keep it in a log. Take your medicines as prescribed Eat low salt foods--Limit salt (sodium) to 2000 mg per day.  Stay as active as you can everyday Limit all fluids for the day to less than 2 liters   At the [facility], you and your health needs are our priority. As part of our continuing mission to provide you with exceptional heart care, we have created designated Provider Care Teams. These Care Teams include your primary Cardiologist (physician) and Advanced Practice Providers (APPs- Physician Assistants and Nurse Practitioners) who all work together to provide you with the care you need, when you need it.   You may see any of the following providers on your designated Care Team at your next follow up: Dr Glori Bickers Dr Haynes Kerns, NP Lyda Jester, [place] Audry Riles, PharmD   Please be sure to bring in all your medications bottles to every appointment.

## 2021-05-22 DIAGNOSIS — J3089 Other allergic rhinitis: Secondary | ICD-10-CM | POA: Diagnosis not present

## 2021-05-22 DIAGNOSIS — J301 Allergic rhinitis due to pollen: Secondary | ICD-10-CM | POA: Diagnosis not present

## 2021-05-22 DIAGNOSIS — J3081 Allergic rhinitis due to animal (cat) (dog) hair and dander: Secondary | ICD-10-CM | POA: Diagnosis not present

## 2021-06-03 ENCOUNTER — Other Ambulatory Visit: Payer: Self-pay | Admitting: Family Medicine

## 2021-06-05 ENCOUNTER — Telehealth: Payer: Self-pay | Admitting: Adult Health

## 2021-06-05 NOTE — Telephone Encounter (Signed)
Rescheduled per provider called and spoke with pt confirmed new appt

## 2021-06-06 ENCOUNTER — Encounter: Payer: Medicare Other | Admitting: Adult Health

## 2021-06-06 ENCOUNTER — Inpatient Hospital Stay: Payer: Medicare Other | Admitting: Adult Health

## 2021-06-06 DIAGNOSIS — J3081 Allergic rhinitis due to animal (cat) (dog) hair and dander: Secondary | ICD-10-CM | POA: Diagnosis not present

## 2021-06-06 DIAGNOSIS — J301 Allergic rhinitis due to pollen: Secondary | ICD-10-CM | POA: Diagnosis not present

## 2021-06-06 DIAGNOSIS — J3089 Other allergic rhinitis: Secondary | ICD-10-CM | POA: Diagnosis not present

## 2021-06-10 ENCOUNTER — Inpatient Hospital Stay: Payer: Medicare Other | Admitting: Adult Health

## 2021-06-12 DIAGNOSIS — J3081 Allergic rhinitis due to animal (cat) (dog) hair and dander: Secondary | ICD-10-CM | POA: Diagnosis not present

## 2021-06-12 DIAGNOSIS — R053 Chronic cough: Secondary | ICD-10-CM | POA: Diagnosis not present

## 2021-06-12 DIAGNOSIS — J301 Allergic rhinitis due to pollen: Secondary | ICD-10-CM | POA: Diagnosis not present

## 2021-06-12 DIAGNOSIS — J3089 Other allergic rhinitis: Secondary | ICD-10-CM | POA: Diagnosis not present

## 2021-06-13 ENCOUNTER — Inpatient Hospital Stay: Payer: Medicare Other | Attending: Oncology | Admitting: Adult Health

## 2021-06-13 ENCOUNTER — Other Ambulatory Visit: Payer: Self-pay

## 2021-06-13 VITALS — BP 132/63 | HR 55 | Temp 97.9°F | Resp 18 | Ht 64.0 in | Wt 153.6 lb

## 2021-06-13 DIAGNOSIS — Z803 Family history of malignant neoplasm of breast: Secondary | ICD-10-CM | POA: Diagnosis not present

## 2021-06-13 DIAGNOSIS — Z807 Family history of other malignant neoplasms of lymphoid, hematopoietic and related tissues: Secondary | ICD-10-CM | POA: Diagnosis not present

## 2021-06-13 DIAGNOSIS — Z808 Family history of malignant neoplasm of other organs or systems: Secondary | ICD-10-CM | POA: Diagnosis not present

## 2021-06-13 DIAGNOSIS — K5900 Constipation, unspecified: Secondary | ICD-10-CM | POA: Diagnosis not present

## 2021-06-13 DIAGNOSIS — C50411 Malignant neoplasm of upper-outer quadrant of right female breast: Secondary | ICD-10-CM

## 2021-06-13 DIAGNOSIS — Z79810 Long term (current) use of selective estrogen receptor modulators (SERMs): Secondary | ICD-10-CM | POA: Insufficient documentation

## 2021-06-13 DIAGNOSIS — Z170 Estrogen receptor positive status [ER+]: Secondary | ICD-10-CM

## 2021-06-13 DIAGNOSIS — Z9011 Acquired absence of right breast and nipple: Secondary | ICD-10-CM | POA: Diagnosis not present

## 2021-06-13 NOTE — Progress Notes (Signed)
SURVIVORSHIP VISIT:   BRIEF ONCOLOGIC HISTORY:  Oncology History  Malignant neoplasm of upper-outer quadrant of right breast in female, estrogen receptor positive ([place])  12/30/2019 Initial Diagnosis   Right breast periareolar biopsy on 12/30/2019 shows a clinical T1a, N0, stage IA invasive ductal carcinoma grade 2, ER/PR +, HER-2+, Ki-67 20%.    01/11/2020 Cancer Staging   Staging form: Breast, AJCC 8th Edition - Clinical stage from 01/11/2020: Stage IA (cT1b, cN0, cM0, G2, ER+, PR+, HER2+) - Signed by Chauncey Cruel, MD on 12/18/2020    01/31/2020 Cancer Staging   Staging form: Breast, AJCC 8th Edition - Pathologic stage from 01/31/2020: Stage IA (pT1c, pN0, cM0, G2, ER+, PR+, HER2+) - Signed by [name], NP on 02/15/2020    01/31/2020 Surgery   Right lumpectomy and SLNB shows T1cN0, stage IA IDC, grade 2, 1SLN negative, margins POSITIVE   02/01/2020 Genetic Testing   Negative genetic testing:  No pathogenic variants detected on the [facility] Breast Cancer STAT panel. The report date is 02/01/2020.  The Breast Cancer STAT panel offered by [facility] includes sequencing and rearrangement analysis for the following 9 genes:  ATM, BRCA1, BRCA2, CDH1, CHEK2, PALB2, PTEN, STK11 and TP53.    03/15/2020 Surgery   Right mastectomy shows residual IDS, 0.9cm, bringing total invasive disease to T1c, N0, M0.  1 additional LN negative.    04/24/2020 - 03/12/2021 Adjuvant Chemotherapy   Weekly Paclitaxel and Trastzumab x 10.  Stopped two doses early due to peripheral neuropathy--last dose of Paclitaxel 06/26/2020.  Then continued on Trastuzumab every 3 weeks for one year    07/2020 -  Anti-estrogen oral therapy   Tamoxifen daily     INTERVAL HISTORY:  Theresa Bradley to review her survivorship care plan detailing her treatment course for breast cancer, as well as monitoring long-term side effects of that treatment, education regarding health maintenance, screening, and overall wellness and health  promotion.     Overall, Theresa Bradley reports feeling quite well.   She is taking Tamoxifen daily.  She is distressed due to a significant episode of constipation she experienced a few weeks ago.  She is taking miralax every day.  She notes that the episode was quite traumatic, and she is hopeful to never experience something so significant again.  She also notes that she is more tired, and is unsure if it is related to the tamoxifen.    She has occasional hot flashes, however she started Gabapentin which helped significantly.    REVIEW OF SYSTEMS:  Review of Systems  Constitutional:  Positive for fatigue. Negative for appetite change, chills, fever and unexpected weight change.  HENT:   Negative for hearing loss, lump/mass and trouble swallowing.   Eyes:  Negative for eye problems and icterus.  Respiratory:  Negative for chest tightness, cough and shortness of breath.   Cardiovascular:  Negative for chest pain, leg swelling and palpitations.  Gastrointestinal:  Positive for constipation. Negative for abdominal distention, abdominal pain, diarrhea, nausea and vomiting.  Endocrine: Negative for hot flashes.  Genitourinary:  Negative for difficulty urinating.   Musculoskeletal:  Negative for arthralgias.  Skin:  Negative for itching and rash.  Neurological:  Negative for dizziness, extremity weakness, headaches and numbness.  Hematological:  Negative for adenopathy. Does not bruise/bleed easily.  Psychiatric/Behavioral:  Negative for depression. The patient is not nervous/anxious.   Breast: Denies any new nodularity, masses, tenderness, nipple changes, or nipple discharge.      ONCOLOGY TREATMENT TEAM:  1. Surgeon:  Dr. Brantley Stage at [facility] 2. Medical Oncologist: Dr Jana Hakim  3. Radiation Oncologist: Dr. Isidore Moos    PAST MEDICAL/SURGICAL HISTORY:  Past Medical History:  Diagnosis Date   Anxiety    Breast cancer [facility]) 2009   right   Cancer ([place])    COUGH, CHRONIC  05/10/2010   Dysrhythmia    occationally "skips a beat"   Family history of brain cancer    Family history of breast cancer    Family history of lung cancer    Family history of multiple myeloma    Family history of skin cancer    GERD (gastroesophageal reflux disease)    HYPERTENSION 04/16/2009   HYPOTHYROIDISM 04/16/2009   OSTEOARTHRITIS 04/16/2009   Personal history of radiation therapy    SENILE LENTIGO 06/10/2010   Past Surgical History:  Procedure Laterality Date   BREAST EXCISIONAL BIOPSY Right    BREAST EXCISIONAL BIOPSY Left    BREAST LUMPECTOMY Right 2009   radiation only   BREAST LUMPECTOMY WITH RADIOACTIVE SEED AND SENTINEL LYMPH NODE BIOPSY Right 01/31/2020   Procedure: RIGHT BREAST LUMPECTOMY WITH RADIOACTIVE SEED AND SENTINEL LYMPH NODE BIOPSY;  Surgeon: [name], MD;  Location: [facility];  Service: General;  Laterality: Right;  [place]  2009   X 2, cancer stage 0, lumpectomy   CESAREAN SECTION     1979, [place]  11/2015   HERNIA REPAIR  2009   JOINT REPLACEMENT  2008   hip   MASTECTOMY Right    OPEN SURGICAL REPAIR OF GLUTEAL TENDON Left 07/18/2019   Procedure: Left hip bearing surface revision with gluteal tendon repair;  Surgeon: Gaynelle Arabian, MD;  Location: [facility];  Service: Orthopedics;  Laterality: Left;  2 hrs   PORTACATH PLACEMENT N/A 03/15/2020   Procedure: INSERTION PORT-A-CATH WITH ULTRASOUND GUIDANCE;  Surgeon: [name], MD;  Location: [facility];  Service: General;  Laterality: N/A;   SIMPLE MASTECTOMY WITH AXILLARY SENTINEL NODE BIOPSY Right 03/15/2020   Procedure: RIGHT SIMPLE MASTECTOMY;  Surgeon: [name], MD;  Location: [facility];  Service: General;  Laterality: Right;     ALLERGIES:  Allergies  Allergen Reactions   Codeine Sulfate Nausea And Vomiting   Erythromycin Base Other (See Comments)     Stomach ache   Esomeprazole Magnesium     REACTION: GI upset     CURRENT MEDICATIONS:  Outpatient Encounter Medications as of 06/13/2021  Medication Sig Note   ALPRAZolam (XANAX) 0.25 MG tablet TAKE 1/2 A TABLET BY MOUTH AT BEDTIME AS NEEDED FOR INSOMNIA    amLODipine (NORVASC) 5 MG tablet TAKE 1 TABLET BY MOUTH EVERY DAY    BIOTIN 5000 PO Take by mouth.    CALCIUM-VITAMIN D PO Take 2 tablets by mouth daily.    citalopram (CELEXA) 20 MG tablet TAKE 1 TABLET BY MOUTH EVERY DAY    EPIPEN 2-PAK 0.3 MG/0.3ML SOAJ injection Inject 0.3 mg into the muscle as needed for anaphylaxis.  07/18/2019: Has but not needed   famotidine (PEPCID) 10 MG tablet Take 10 mg by mouth daily.     gabapentin (NEURONTIN) 300 MG capsule Take 1 capsule (300 mg total) by mouth at bedtime.    levothyroxine (SYNTHROID) 50 MCG tablet TAKE 1 TABLET BY MOUTH EVERY DAY    meloxicam (MOBIC) 15 MG tablet TAKE 1 TABLET BY MOUTH EVERY DAY    tamoxifen (NOLVADEX)  20 MG tablet Take 20 mg by mouth daily.    tretinoin (RETIN-A) 0.025 % cream Apply topically at bedtime.    [DISCONTINUED] benzonatate (TESSALON PERLES) 100 MG capsule Take 1 capsule (100 mg total) by mouth 3 (three) times daily as needed for cough.    [DISCONTINUED] prochlorperazine (COMPAZINE) 10 MG tablet Take 1 tablet (10 mg total) by mouth every 6 (six) hours as needed (Nausea or vomiting).    No facility-administered encounter medications on file as of 06/13/2021.     ONCOLOGIC FAMILY HISTORY:  Family History  Problem Relation Age of Onset   Arthritis Other    Hypertension Other    Cancer Other        2 aunts   Hyperlipidemia Father    Dementia Father    Skin cancer Father    Skin cancer Mother    Breast cancer Maternal Aunt 43   Breast cancer Maternal Aunt        dx. mid-70s   Lung cancer Paternal Uncle    Multiple myeloma Paternal Uncle    Hypertension Brother    Cancer Paternal Aunt        unknown type, dx. >50   Brain cancer Cousin        dx.  58s, paternal cousin     GENETIC COUNSELING/TESTING: See above  SOCIAL HISTORY:  Social History   Socioeconomic History   Marital status: Married    Spouse name: Not on file   Number of children: 2   Years of education: Not on file   Highest education level: Not on file  Occupational History   Occupation: Cabin crew    Comment: Full-time   Occupation: Science writer: [employer]    Comment: Retired  Tobacco Use   Smoking status: Never   Smokeless tobacco: Never   Tobacco comments:    father smoked when she was younger   Scientific laboratory technician Use: Never used  Substance and Sexual Activity   Alcohol use: Yes    Comment: occasionally    Drug use: No   Sexual activity: Not on file  Other Topics Concern   Not on file  Social History Narrative   Lives with husband on 2 level house, has two sons, one local with grandkids   Works FT as Cabin crew; Psychiatrist   Enjoys working out at gym about 2X/week, attends silver sneakers classes   Dad struggles with dementia, mother still living who is caretaker; occasionally travels to visit to assist mother.   Social Determinants of Health   Financial Resource Strain: High Risk   Difficulty of Paying Living Expenses: Very hard  Food Insecurity: No Food Insecurity   Worried About Charity fundraiser in the Last Year: Never true   Ran Out of Food in the Last Year: Never true  Transportation Needs: No Transportation Needs   Lack of Transportation (Medical): No   Lack of Transportation (Non-Medical): No  Physical Activity: Inactive   Days of Exercise per Week: 0 days   Minutes of Exercise per Session: 0 min  Stress: No Stress Concern Present   Feeling of Stress : Not at all  Social Connections: Moderately Integrated   Frequency of Communication with Friends and Family: More than three times a week   Frequency of Social Gatherings with Friends and Family: Twice a week   Attends Religious Services: Never   Corporate treasurer or Organizations: Yes   Attends Club or  Organization Meetings: 1 to 4 times per year   Marital Status: Married  Human resources officer Violence: Not At Risk   Fear of Current or Ex-Partner: No   Emotionally Abused: No   Physically Abused: No   Sexually Abused: No     OBSERVATIONS/OBJECTIVE:  BP 132/63 (BP Location: Left Arm, Patient Position: Sitting)   Pulse (!) 55   Temp 97.9 F (36.6 C) (Temporal)   Resp 18   Ht '5\' 4"'  (1.626 m)   Wt 153 lb 9.6 oz (69.7 kg)   SpO2 97%   BMI 26.37 kg/m  GENERAL: Patient is a well appearing female in no acute distress HEENT:  Sclerae anicteric.  Oropharynx clear and moist. No ulcerations or evidence of oropharyngeal candidiasis. Neck is supple.  NODES:  No cervical, supraclavicular, or axillary lymphadenopathy palpated.  BREAST EXAM:  Deferred. LUNGS:  Clear to auscultation bilaterally.  No wheezes or rhonchi. HEART:  Regular rate and rhythm. No murmur appreciated. ABDOMEN:  Soft, nontender.  Positive, normoactive bowel sounds. No organomegaly palpated. MSK:  No focal spinal tenderness to palpation. Full range of motion bilaterally in the upper extremities. EXTREMITIES:  No peripheral edema.   SKIN:  Clear with no obvious rashes or skin changes. No nail dyscrasia. NEURO:  Nonfocal. Well oriented.  Appropriate affect.   LABORATORY DATA:  None for this visit.  DIAGNOSTIC IMAGING:  None for this visit.      ASSESSMENT AND PLAN:  Ms.. Bradley is a pleasant 70 y.o. female with Stage IA right breast invasive ductal carcinoma, ER+/PR+/HER2-, diagnosed in 12/2019, treated with lumpectomy followed by mastectomy, adjuvant chemotherapy, maintenance trastuzumab, adjuvant radiation therapy, and anti-estrogen therapy with Tamoxifen beginning in 07/2020.  She presents to the Survivorship Clinic for our initial meeting and routine follow-up post-completion of treatment for breast cancer.    1. Stage IA right breast cancer:  Theresa Bradley is continuing to  recover from definitive treatment for breast cancer. She will follow-up with her medical oncologist, Dr. Jana Hakim in 08/2021 with history and physical exam per surveillance protocol.  She will continue her anti-estrogen therapy with Tamoxifen for now.  We discussed the option to hold it for a couple of weeks to determine if it is causing her tiredness.  We also discussed side effects that are possible with the aromatase inhibitors should she desire to change therapy.  For now, she will think about it and call us if she decides to change.  Her mammogram is due 03/2022. Today, a comprehensive survivorship care plan and treatment summary was reviewed with the patient today detailing her breast cancer diagnosis, treatment course, potential late/long-term effects of treatment, appropriate follow-up care with recommendations for the future, and patient education resources.  A copy of this summary, along with a letter will be sent to the patient's primary care provider via mail/fax/In Basket message after today's visit.    2. Constipation: I suggested she increase her water intake and add colace to her miralax.  She is planning on trying this.    3. Bone health:    She was given education on specific activities to promote bone health.  4. Cancer screening:  Due to Theresa Bradley's history and her age, she should receive screening for skin cancers, colon cancer.  The information and recommendations are listed on the patient's comprehensive care plan/treatment summary and were reviewed in detail with the patient.    5. Health maintenance and wellness promotion: Theresa Bradley was encouraged to consume 5-7 servings of fruits and vegetables  per day. We reviewed the "Nutrition Rainbow" handout, as well as the handout "Take Control of Your Health and Reduce Your Cancer Risk" from the [employer].  She was also encouraged to engage in moderate to vigorous exercise for 30 minutes per day most days of the week. We  discussed the [employer] fitness program, which is designed for cancer survivors to help them become more physically fit after cancer treatments.  She was instructed to limit her alcohol consumption and continue to abstain from tobacco use.    6. Support services/counseling: It is not uncommon for this period of the patient's cancer care trajectory to be one of many emotions and stressors.  We discussed how this can be increasingly difficult during the times of quarantine and social distancing due to the COVID-19 pandemic.   She was given information regarding our available services and encouraged to contact me with any questions or for help enrolling in any of our support group/programs.    Follow up instructions:    -Return to cancer center in 08/2021 for f/u with Dr. Jana Hakim  -Mammogram due in 03/2022 -She is welcome to return back to the Survivorship Clinic at any time; no additional follow-up needed at this time.  -Consider referral back to survivorship as a long-term survivor for continued surveillance  Total encounter time: 30 minutes in SCP preparation, face to face visit time, chart review, order entry, and documentation of the encounter.    Wilber Bihari, NP 06/14/21 9:36 AM Medical Oncology and Hematology [facility] [place], [place] [address] Tel. [phone]    Fax. [phone]  *Total Encounter Time as defined by the Centers for Medicare and Medicaid Services includes, in addition to the face-to-face time of a patient visit (documented in the note above) non-face-to-face time: obtaining and reviewing outside history, ordering and reviewing medications, tests or procedures, care coordination (communications with other health care professionals or caregivers) and documentation in the medical record.

## 2021-06-14 ENCOUNTER — Encounter: Payer: Self-pay | Admitting: Adult Health

## 2021-06-14 ENCOUNTER — Encounter: Payer: Self-pay | Admitting: Oncology

## 2021-06-19 DIAGNOSIS — J3081 Allergic rhinitis due to animal (cat) (dog) hair and dander: Secondary | ICD-10-CM | POA: Diagnosis not present

## 2021-06-19 DIAGNOSIS — J3089 Other allergic rhinitis: Secondary | ICD-10-CM | POA: Diagnosis not present

## 2021-06-19 DIAGNOSIS — J301 Allergic rhinitis due to pollen: Secondary | ICD-10-CM | POA: Diagnosis not present

## 2021-06-24 ENCOUNTER — Encounter: Payer: Self-pay | Admitting: Gastroenterology

## 2021-06-25 DIAGNOSIS — C50411 Malignant neoplasm of upper-outer quadrant of right female breast: Secondary | ICD-10-CM | POA: Diagnosis not present

## 2021-06-25 DIAGNOSIS — Z95828 Presence of other vascular implants and grafts: Secondary | ICD-10-CM | POA: Diagnosis not present

## 2021-06-25 DIAGNOSIS — Z170 Estrogen receptor positive status [ER+]: Secondary | ICD-10-CM | POA: Diagnosis not present

## 2021-07-03 DIAGNOSIS — J3081 Allergic rhinitis due to animal (cat) (dog) hair and dander: Secondary | ICD-10-CM | POA: Diagnosis not present

## 2021-07-03 DIAGNOSIS — J301 Allergic rhinitis due to pollen: Secondary | ICD-10-CM | POA: Diagnosis not present

## 2021-07-03 DIAGNOSIS — J3089 Other allergic rhinitis: Secondary | ICD-10-CM | POA: Diagnosis not present

## 2021-07-10 DIAGNOSIS — D2239 Melanocytic nevi of other parts of face: Secondary | ICD-10-CM | POA: Diagnosis not present

## 2021-07-10 DIAGNOSIS — L570 Actinic keratosis: Secondary | ICD-10-CM | POA: Diagnosis not present

## 2021-07-10 DIAGNOSIS — B351 Tinea unguium: Secondary | ICD-10-CM | POA: Diagnosis not present

## 2021-07-10 DIAGNOSIS — Z85820 Personal history of malignant melanoma of skin: Secondary | ICD-10-CM | POA: Diagnosis not present

## 2021-07-10 DIAGNOSIS — L821 Other seborrheic keratosis: Secondary | ICD-10-CM | POA: Diagnosis not present

## 2021-07-10 DIAGNOSIS — D225 Melanocytic nevi of trunk: Secondary | ICD-10-CM | POA: Diagnosis not present

## 2021-07-10 DIAGNOSIS — D1801 Hemangioma of skin and subcutaneous tissue: Secondary | ICD-10-CM | POA: Diagnosis not present

## 2021-07-10 DIAGNOSIS — L814 Other melanin hyperpigmentation: Secondary | ICD-10-CM | POA: Diagnosis not present

## 2021-07-18 DIAGNOSIS — J3089 Other allergic rhinitis: Secondary | ICD-10-CM | POA: Diagnosis not present

## 2021-07-18 DIAGNOSIS — J301 Allergic rhinitis due to pollen: Secondary | ICD-10-CM | POA: Diagnosis not present

## 2021-07-26 ENCOUNTER — Encounter (HOSPITAL_COMMUNITY): Payer: Self-pay | Admitting: Internal Medicine

## 2021-07-26 ENCOUNTER — Ambulatory Visit (HOSPITAL_BASED_OUTPATIENT_CLINIC_OR_DEPARTMENT_OTHER)
Admission: RE | Admit: 2021-07-26 | Discharge: 2021-07-26 | Disposition: A | Discharge status: Home / Self Care | Payer: Medicare Other | Source: Ambulatory Visit | Attending: Internal Medicine | Admitting: Internal Medicine

## 2021-07-26 ENCOUNTER — Other Ambulatory Visit: Payer: Self-pay

## 2021-07-26 ENCOUNTER — Ambulatory Visit (HOSPITAL_COMMUNITY)
Admission: RE | Admit: 2021-07-26 | Discharge: 2021-07-26 | Disposition: A | Discharge status: Home / Self Care | Payer: Medicare Other | Source: Ambulatory Visit | Attending: Internal Medicine | Admitting: Internal Medicine

## 2021-07-26 VITALS — BP 110/68 | HR 51 | Wt 150.4 lb

## 2021-07-26 DIAGNOSIS — E039 Hypothyroidism, unspecified: Secondary | ICD-10-CM | POA: Diagnosis not present

## 2021-07-26 DIAGNOSIS — I358 Other nonrheumatic aortic valve disorders: Secondary | ICD-10-CM | POA: Insufficient documentation

## 2021-07-26 DIAGNOSIS — I10 Essential (primary) hypertension: Secondary | ICD-10-CM

## 2021-07-26 DIAGNOSIS — Z0189 Encounter for other specified special examinations: Secondary | ICD-10-CM

## 2021-07-26 DIAGNOSIS — Z79899 Other long term (current) drug therapy: Secondary | ICD-10-CM | POA: Diagnosis not present

## 2021-07-26 DIAGNOSIS — Z170 Estrogen receptor positive status [ER+]: Secondary | ICD-10-CM | POA: Diagnosis not present

## 2021-07-26 DIAGNOSIS — Z5181 Encounter for therapeutic drug level monitoring: Secondary | ICD-10-CM | POA: Insufficient documentation

## 2021-07-26 DIAGNOSIS — C50411 Malignant neoplasm of upper-outer quadrant of right female breast: Secondary | ICD-10-CM

## 2021-07-26 DIAGNOSIS — K219 Gastro-esophageal reflux disease without esophagitis: Secondary | ICD-10-CM | POA: Insufficient documentation

## 2021-07-26 LAB — ECHOCARDIOGRAM COMPLETE
Area-P 1/2: 3.03 cm2
S' Lateral: 2.7 cm

## 2021-07-26 NOTE — Progress Notes (Signed)
CARDIO-ONCOLOGY CLINIC CONSULT NOTE  Referring Physician: Primary Care: Primary Cardiologist:  HPI:  Ms. Virtue is 70 y.o. female with HTN right breast cancer referred by Dr. Jana Hakim for enrollment into the Cardio-Oncology program due to reduced EF on most recent echo.   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 In 8/20 had melanoma on R breast and was excised.   In 2/21 found to have recurrent right breast CA - triple positive.   Breast CA History  (1) status post repeat right lumpectomy and sentinel lymph node sampling 01/31/2020 for a pT1c pN0, stage IA invasive ductal carcinoma, grade 2, with positive margins             (a) total one sentinel node removed   (2) s/p right mastectomy 03/15/2020 showing residual invasive ductal carcinoma measuring 0.9 cm, but negative margins (added to prior 1.1 lumpectomy, still T1 N0 or stage IA)             (a) one additional axillary node was removed (total 2 right axillary nodes removed)   (3) adjuvant chemotherapy with paclitaxel and trastuzumab weekly x 12 started 04/24/2020, completing 10 doses 06/26/2020             (a) final 2 doses omitted secondary to peripheral neuropathy   (4) trastuzumab to be continued every 21 days to total one year (through May 2022).             (a) echo 01/17/2020 shows an ejection fraction in the 60-65% range             (b) echo 05/30/2020 shows an ejection fraction of 60-65%.             (c) echo 08/28/2020 shows an ejection fraction in the 55-60% range             (d) echo 12/14/2020 shows an ejection fraction in the 60-65% range.             (e) echo 03/26/2021 shows an EF in the 55% range with decreased GLS  Herceptin stopped in April due to abnormal echo after 8/12 doses. Feels fine. No CP or SOB.  Not overly active.   Feels well, no symptoms of dyspnea, orthopnea, PND or chest pain.  Never resumed herceptin after last appointment, oncologist felt it was not needed.  Just on  tamoxifen 5 year course going forward.      Past Medical History:  Diagnosis Date   Anxiety    Breast cancer ([place]) 2009   right   Cancer ([place])    COUGH, CHRONIC 05/10/2010   Dysrhythmia    occationally "skips a beat"   Family history of brain cancer    Family history of breast cancer    Family history of lung cancer    Family history of multiple myeloma    Family history of skin cancer    GERD (gastroesophageal reflux disease)    HYPERTENSION 04/16/2009   HYPOTHYROIDISM 04/16/2009   OSTEOARTHRITIS 04/16/2009   Personal history of radiation therapy    SENILE LENTIGO 06/10/2010    Current Outpatient Medications  Medication Sig Dispense Refill   ALPRAZolam (XANAX) 0.25 MG tablet TAKE 1/2 A TABLET BY MOUTH AT BEDTIME AS NEEDED FOR INSOMNIA 45 tablet 0   amLODipine (NORVASC) 5 MG tablet TAKE 1 TABLET BY MOUTH EVERY DAY 90 tablet 3   BIOTIN 5000 PO Take by mouth.     budesonide-formoterol (SYMBICORT) 80-4.5 MCG/ACT inhaler Inhale 1  puff into the lungs daily in the afternoon.     CALCIUM-VITAMIN D PO Take 2 tablets by mouth daily.     citalopram (CELEXA) 20 MG tablet TAKE 1 TABLET BY MOUTH EVERY DAY 90 tablet 3   EPIPEN 2-PAK 0.3 MG/0.3ML SOAJ injection Inject 0.3 mg into the muscle as needed for anaphylaxis.      famotidine (PEPCID) 10 MG tablet Take 10 mg by mouth daily.      gabapentin (NEURONTIN) 300 MG capsule Take 1 capsule (300 mg total) by mouth at bedtime. 90 capsule 4   levothyroxine (SYNTHROID) 50 MCG tablet TAKE 1 TABLET BY MOUTH EVERY DAY 90 tablet 0   meloxicam (MOBIC) 15 MG tablet TAKE 1 TABLET BY MOUTH EVERY DAY 90 tablet 1   tamoxifen (NOLVADEX) 20 MG tablet Take 20 mg by mouth daily.     tretinoin (RETIN-A) 0.025 % cream Apply topically at bedtime. 45 g 6   No current facility-administered medications for this encounter.    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  Tobacco Use   Smoking status: Never   Smokeless tobacco: Never   Tobacco comments:    father smoked when she was younger   Scientific laboratory technician Use: Never used  Substance and Sexual Activity   Alcohol use: Yes    Comment: occasionally    Drug use: No   Sexual activity: Not on file  Other Topics Concern   Not on file  Social History Narrative   Lives with husband on 2 level house, has two sons, one local with grandkids   Works FT as Cabin crew; Psychiatrist   Enjoys working out at gym about 2X/week, attends silver sneakers classes   Dad struggles with dementia, mother still living who is caretaker; occasionally travels to visit to assist mother.   Social Determinants of Health   Financial Resource Strain: High Risk   Difficulty of Paying Living Expenses: Very hard  Food Insecurity: No Food Insecurity   Worried About Charity fundraiser in the Last Year: Never true   Ran Out of Food in the Last Year: Never true  Transportation Needs: No Transportation Needs   Lack of Transportation (Medical): No   Lack of Transportation (Non-Medical): No  Physical Activity: Inactive   Days of Exercise per Week: 0 days   Minutes of Exercise per Session: 0 min  Stress: No Stress Concern Present   Feeling of Stress : Not at all  Social Connections: Moderately Integrated   Frequency of Communication with Friends and Family: More than three times a week   Frequency of Social Gatherings with Friends and Family: Twice a week   Attends Religious Services: Never   Marine scientist or Organizations: Yes   Attends Theatre manager Meetings: 1 to 4 times per year   Marital Status: Married  Human resources officer Violence: Not At Risk  Fear of Current or Ex-Partner: No   Emotionally Abused: No   Physically Abused: No   Sexually Abused: No      Family History  Problem Relation Age of Onset   Arthritis Other    Hypertension Other    Cancer Other        2 aunts   Hyperlipidemia Father    Dementia Father    Skin cancer Father    Skin cancer Mother    Breast cancer Maternal Aunt 63   Breast cancer Maternal Aunt        dx. mid-70s   Lung cancer Paternal Uncle    Multiple myeloma Paternal Uncle    Hypertension Brother    Cancer Paternal Aunt        unknown type, dx. >50   Brain cancer Cousin        dx. 67s, paternal cousin    Vitals:   07/26/21 0955  BP: 110/68  Pulse: (!) 51  SpO2: 97%  Weight: 68.2 kg (150 lb 6.4 oz)    PHYSICAL EXAM: General:  Well appearing. No respiratory difficulty HEENT: normal Neck: supple. no JVD. Carotids 2+ bilat; no bruits. No lymphadenopathy or thryomegaly appreciated. Cor: PMI nondisplaced. Regular rate & rhythm. No rubs, gallops or murmurs. Lungs: clear Abdomen: soft, nontender, nondistended. No hepatosplenomegaly. No bruits or masses. Good bowel sounds. Extremities: no cyanosis, clubbing, rash, edema Neuro: alert & oriented x 3, cranial nerves grossly intact. moves all 4 extremities w/o difficulty. Affect pleasant.  ECG: Sinus brady nonspecific T wave abnormality Personally reviewed   ASSESSMENT & PLAN:  1. Right Breast Cancer - On herceptin since 6/21 - Echos as above.  - Most recent ECHO today EF 60-65%, normal GLS -20% - Last visit recommended restart of herceptin - Explained incidence of Herceptin cardiotoxicity and role of Cardio-oncology clinic at length.   2. HTN - Blood pressure well controlled. Continue current regimen.  Katherine Roan, MD  10:14 AM  Patient seen and examined with the above-signed Advanced Practice Provider and/or  Housestaff. I personally reviewed laboratory data, imaging studies and relevant notes. I independently examined the patient and formulated the important aspects of the plan. I have edited the note to reflect any of my changes or salient points. I have personally discussed the plan with the patient and/or family.  Doing well. Has finished Herceptin. BP ok.   Echo today EF 60-65%  General:  Well appearing. No resp difficulty HEENT: normal Neck: supple. no JVD. Carotids 2+ bilat; no bruits. No lymphadenopathy or thryomegaly appreciated. Cor: PMI nondisplaced. Regular rate & rhythm. No rubs, gallops or murmurs. Lungs: clear Abdomen: soft, nontender, nondistended. No hepatosplenomegaly. No bruits or masses. Good bowel sounds. Extremities: no cyanosis, clubbing, rash, edema Neuro: alert & orientedx3, cranial nerves grossly intact. moves all 4 extremities w/o difficulty. Affect pleasant  I reviewed echos personally. EF and Doppler parameters stable. No HF on exam. She has completed Herceptin. F/u PRN.   Glori Bickers, MD  1:56 PM

## 2021-07-26 NOTE — Patient Instructions (Signed)
No Labs done today.   No medication changes were made. Please continue all current medications as prescribed.  CONGRATULATIONS!!!! YOU HAVE GRADUATED FROM THE [facility]!!!!

## 2021-07-31 ENCOUNTER — Ambulatory Visit: Payer: Medicare Other | Admitting: Gastroenterology

## 2021-07-31 ENCOUNTER — Encounter: Payer: Self-pay | Admitting: Gastroenterology

## 2021-07-31 VITALS — BP 102/72 | HR 53 | Ht 64.0 in | Wt 150.4 lb

## 2021-07-31 DIAGNOSIS — J3089 Other allergic rhinitis: Secondary | ICD-10-CM | POA: Diagnosis not present

## 2021-07-31 DIAGNOSIS — R059 Cough, unspecified: Secondary | ICD-10-CM

## 2021-07-31 DIAGNOSIS — K5900 Constipation, unspecified: Secondary | ICD-10-CM

## 2021-07-31 DIAGNOSIS — Z1211 Encounter for screening for malignant neoplasm of colon: Secondary | ICD-10-CM

## 2021-07-31 DIAGNOSIS — J3081 Allergic rhinitis due to animal (cat) (dog) hair and dander: Secondary | ICD-10-CM | POA: Diagnosis not present

## 2021-07-31 DIAGNOSIS — Z1212 Encounter for screening for malignant neoplasm of rectum: Secondary | ICD-10-CM

## 2021-07-31 DIAGNOSIS — J301 Allergic rhinitis due to pollen: Secondary | ICD-10-CM | POA: Diagnosis not present

## 2021-07-31 MED ORDER — CLENPIQ 10-3.5-12 MG-GM -GM/160ML PO SOLN: 320.0000 mL | Freq: Once | ORAL | 0 refills | Status: AC

## 2021-07-31 NOTE — Patient Instructions (Signed)
If you are age 72 or older, your body mass index should be between 23-30. Your Body mass index is 25.81 kg/m. If this is out of the aforementioned range listed, please consider follow up with your Primary Care Provider.  If you are age 56 or younger, your body mass index should be between 19-25. Your Body mass index is 25.81 kg/m. If this is out of the aformentioned range listed, please consider follow up with your Primary Care Provider.   You have been scheduled for a colonoscopy. Please follow written instructions given to you at your visit today.  Please pick up your prep supplies at the pharmacy within the next 1-3 days. If you use inhalers (even only as needed), please bring them with you on the day of your procedure.  Stop Famotidine.  Due to recent changes in healthcare laws, you may see the results of your imaging and laboratory studies on MyChart before your provider has had a chance to review them.  We understand that in some cases there may be results that are confusing or concerning to you. Not all laboratory results come back in the same time frame and the provider may be waiting for multiple results in order to interpret others.  Please give [place] 48 hours in order for your provider to thoroughly review all the results before contacting the office for clarification of your results.    The [facility] providers would like to encourage you to use [facility] to communicate with providers for non-urgent requests or questions.  Due to long hold times on the telephone, sending your provider a message by [facility] may be a faster and more efficient way to get a response.  Please allow 48 business hours for a response.  Please remember that this is for non-urgent requests.   It was a pleasure to see you today!  Thank you for trusting me with your gastrointestinal care!     Scott E.Candis Schatz , MD

## 2021-07-31 NOTE — Progress Notes (Signed)
HPI : Theresa Bradley is a very pleasant 70 year old female with a history of breast cancer who is referred to [place] by Dr. Carolann Littler for further evaluation and treatment of constipation and possible colonoscopy.  Patient states that she had been having severe problems with constipation until she finally used a fleets enema, which relieved her symptoms.  She had been going about 3 times a week, with stools that were very hard and difficult to pass.  She has had problems with constipation in the past, but never as severe as it was recently.  Since using the fleets enemas, she has been using Citrucel daily and MiraLAX every other day and this seems to work well for her.  She has been having bowel movements daily on this regimen. She also has problems with a chronic cough and this has previously been attributed to GERD.  She has been prescribed famotidine for a long time, but does not think this is really helping with her cough at all.  She denies problems with frequent heartburn or acid regurgitation.  Recently, she was prescribed Symbicort and this seems to have helped quite a bit with her cough. 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  Her most recent echocardiogram showed normal cardiac function and she was dismissed from the cardiology oncology clinic.  She denies any evidence of cardiopulmonary symptoms to include chest pain/pressure, shortness of breath, orthopnea or lower extremity edema.    Past Medical History:  Diagnosis Date   Anxiety    Breast cancer ([place]) 2009   right   Cancer ([place])    COUGH, CHRONIC 05/10/2010   Dysrhythmia    occationally "skips a beat"   Family history of brain cancer    Family history of breast cancer    Family history of lung cancer    Family history of multiple myeloma    Family history of skin cancer    GERD (gastroesophageal reflux disease)    HYPERTENSION 04/16/2009    HYPOTHYROIDISM 04/16/2009   OSTEOARTHRITIS 04/16/2009   Personal history of radiation therapy    SENILE LENTIGO 06/10/2010     Past Surgical History:  Procedure Laterality Date   BREAST EXCISIONAL BIOPSY Right    BREAST EXCISIONAL BIOPSY Left    BREAST LUMPECTOMY Right 2009   radiation only   BREAST LUMPECTOMY WITH RADIOACTIVE SEED AND SENTINEL LYMPH NODE BIOPSY Right 01/31/2020   Procedure: RIGHT BREAST LUMPECTOMY WITH RADIOACTIVE SEED AND SENTINEL LYMPH NODE BIOPSY;  Surgeon: [name], MD;  Location: [place];  Service: General;  Laterality: Right;  [place]  2009   X 2, cancer stage 0, lumpectomy   CESAREAN SECTION     1979, [place]  11/2015   HERNIA REPAIR  2009   JOINT REPLACEMENT  2008   hip   MASTECTOMY Right    OPEN SURGICAL REPAIR OF GLUTEAL TENDON Left 07/18/2019   Procedure: Left hip bearing surface revision with gluteal tendon repair;  Surgeon: Gaynelle Arabian, MD;  Location: [facility];  Service: Orthopedics;  Laterality: Left;  2 hrs   PORTACATH PLACEMENT N/A 03/15/2020   Procedure: INSERTION PORT-A-CATH WITH ULTRASOUND GUIDANCE;  Surgeon: [name], MD;  Location: [place];  Service: General;  Laterality: N/A;   SIMPLE MASTECTOMY WITH AXILLARY SENTINEL NODE BIOPSY Right 03/15/2020   Procedure: RIGHT SIMPLE MASTECTOMY;  Surgeon: [name], MD;  Location: [place];  Service: General;  Laterality: Right;   Family History  Problem Relation Age of Onset   Arthritis Other    Hypertension Other    Cancer Other        2 aunts   Hyperlipidemia Father    Dementia Father    Skin cancer Father    Skin cancer Mother    Breast cancer Maternal Aunt 37   Breast cancer Maternal Aunt        dx. mid-70s   Lung cancer Paternal Uncle    Multiple myeloma Paternal Uncle    Hypertension Brother    Cancer Paternal Aunt        unknown  type, dx. >50   Brain cancer Cousin        dx. 64s, paternal cousin   Social History   Tobacco Use   Smoking status: Never   Smokeless tobacco: Never   Tobacco comments:    father smoked when she was younger   Vaping Use   Vaping Use: Never used  Substance Use Topics   Alcohol use: Yes    Comment: occasionally    Drug use: No   Current Outpatient Medications  Medication Sig Dispense Refill   ALPRAZolam (XANAX) 0.25 MG tablet TAKE 1/2 A TABLET BY MOUTH AT BEDTIME AS NEEDED FOR INSOMNIA 45 tablet 0   amLODipine (NORVASC) 5 MG tablet TAKE 1 TABLET BY MOUTH EVERY DAY 90 tablet 3   BIOTIN 5000 PO Take by mouth.     budesonide-formoterol (SYMBICORT) 80-4.5 MCG/ACT inhaler Inhale 1 puff into the lungs daily in the afternoon.     CALCIUM-VITAMIN D PO Take 2 tablets by mouth daily.     citalopram (CELEXA) 20 MG tablet TAKE 1 TABLET BY MOUTH EVERY DAY 90 tablet 3   EPIPEN 2-PAK 0.3 MG/0.3ML SOAJ injection Inject 0.3 mg into the muscle as needed for anaphylaxis.      famotidine (PEPCID) 10 MG tablet Take 10 mg by mouth daily.      gabapentin (NEURONTIN) 300 MG capsule Take 1 capsule (300 mg total) by mouth at bedtime. 90 capsule 4   levothyroxine (SYNTHROID) 50 MCG tablet TAKE 1 TABLET BY MOUTH EVERY DAY 90 tablet 0   meloxicam (MOBIC) 15 MG tablet TAKE 1 TABLET BY MOUTH EVERY DAY 90 tablet 1   tamoxifen (NOLVADEX) 20 MG tablet Take 20 mg by mouth daily.     tretinoin (RETIN-A) 0.025 % cream Apply topically at bedtime. 45 g 6   No current facility-administered medications for this visit.   Allergies  Allergen Reactions   Codeine Sulfate Nausea And Vomiting   Erythromycin Base Other (See Comments)    Stomach ache   Esomeprazole Magnesium     REACTION: GI upset     Review of Systems: All systems reviewed and negative except where noted in HPI.    ECHOCARDIOGRAM COMPLETE  Result Date: 07/26/2021    ECHOCARDIOGRAM REPORT   Patient Name:   Theresa Bradley Date of Exam: 07/26/2021  Medical Rec #:  [id number]       Height:       64.0 in Accession #:    [id number]      Weight:       153.6 lb Date of Birth:  [birth year]        BSA:          1.749 m Patient Age:    61 years  BP:           137/72 mmHg Patient Gender: F               HR:           51 bpm. Exam Location:  Outpatient Procedure: 2D Echo Indications:    Chemo  History:        Patient has prior history of Echocardiogram examinations, most                 recent 03/26/2021. Risk Factors:Hypertension. GERD, Breast cancer,                 Hypothyroidism.  Sonographer:    Leavy Cella RDCS Referring Phys: [id number] DANIEL R BENSIMHON IMPRESSIONS  1. Left ventricular ejection fraction, by estimation, is 60 to 65%. The left ventricle has normal function. The left ventricle has no regional wall motion abnormalities. There is mild left ventricular hypertrophy of the basal-septal segment. Left ventricular diastolic parameters are consistent with Grade I diastolic dysfunction (impaired relaxation).  2. Right ventricular systolic function is normal. The right ventricular size is normal.  3. The mitral valve is normal in structure. No evidence of mitral valve regurgitation. No evidence of mitral stenosis.  4. The aortic valve is normal in structure. There is mild calcification of the aortic valve. Aortic valve regurgitation is not visualized. No aortic stenosis is present.  5. The inferior vena cava is normal in size with greater than 50% respiratory variability, suggesting right atrial pressure of 3 mmHg. FINDINGS  Left Ventricle: Left ventricular ejection fraction, by estimation, is 60 to 65%. The left ventricle has normal function. The left ventricle has no regional wall motion abnormalities. The left ventricular internal cavity size was normal in size. There is  mild left ventricular hypertrophy of the basal-septal segment. Left ventricular diastolic parameters are consistent with Grade I diastolic dysfunction (impaired relaxation). Right  Ventricle: The right ventricular size is normal. No increase in right ventricular wall thickness. Right ventricular systolic function is normal. Left Atrium: Left atrial size was normal in size. Right Atrium: Right atrial size was normal in size. Pericardium: There is no evidence of pericardial effusion. Mitral Valve: The mitral valve is normal in structure. Mild mitral annular calcification. No evidence of mitral valve regurgitation. No evidence of mitral valve stenosis. Tricuspid Valve: The tricuspid valve is normal in structure. Tricuspid valve regurgitation is trivial. No evidence of tricuspid stenosis. Aortic Valve: The aortic valve is normal in structure. There is mild calcification of the aortic valve. Aortic valve regurgitation is not visualized. No aortic stenosis is present. Pulmonic Valve: The pulmonic valve was normal in structure. Pulmonic valve regurgitation is mild. No evidence of pulmonic stenosis. Aorta: The aortic root is normal in size and structure. Venous: The inferior vena cava is normal in size with greater than 50% respiratory variability, suggesting right atrial pressure of 3 mmHg. IAS/Shunts: No atrial level shunt detected by color flow Doppler.  LEFT VENTRICLE PLAX 2D LVIDd:         4.70 cm  Diastology LVIDs:         2.70 cm  LV e' medial:    5.11 cm/s LV PW:         1.00 cm  LV E/e' medial:  18.9 LV IVS:        1.00 cm  LV e' lateral:   6.53 cm/s LVOT diam:     1.80 cm  LV E/e' lateral: 14.8 LVOT Area:  2.54 cm                         2D Longitudinal Strain                         2D Strain GLS Avg:     -20.7 % RIGHT VENTRICLE TAPSE (M-mode): 2.1 cm LEFT ATRIUM             Index       RIGHT ATRIUM          Index LA diam:        3.60 cm 2.06 cm/m  RA Area:     9.46 cm LA Vol (A2C):   30.0 ml 17.15 ml/m RA Volume:   18.90 ml 10.81 ml/m LA Vol (A4C):   30.5 ml 17.44 ml/m LA Biplane Vol: 31.5 ml 18.01 ml/m   AORTA Ao Root diam: 3.10 cm MITRAL VALVE MV Area (PHT): 3.03 cm    SHUNTS  MV Decel Time: 250 msec    Systemic Diam: 1.80 cm MV E velocity: 96.40 cm/s MV A velocity: 63.30 cm/s MV E/A ratio:  1.52 Glori Bickers MD Electronically signed by Glori Bickers MD Signature Date/Time: 07/26/2021/10:24:40 AM    Final     Physical Exam: There were no vitals taken for this visit. Constitutional: Pleasant,well-developed, Caucasian female in no acute distress. HEENT: Normocephalic and atraumatic. Conjunctivae are normal. No scleral icterus. Cardiovascular: Normal rate, regular rhythm.  Pulmonary/chest: Effort normal and breath sounds normal. No wheezing, rales or rhonchi. Abdominal: Soft, nondistended, nontender. Bowel sounds active throughout. There are no masses palpable. No hepatomegaly. Extremities: no edema Neurological: Alert and oriented to person place and time. Skin: Skin is warm and dry. No rashes noted. Psychiatric: Normal mood and affect. Behavior is normal.  CBC    Component Value Date/Time   WBC 5.9 03/12/2021 1107   RBC 4.37 03/12/2021 1107   HGB 12.9 03/12/2021 1107   HGB 13.9 01/11/2020 1209   HCT 39.1 03/12/2021 1107   PLT 254 03/12/2021 1107   PLT 308 01/11/2020 1209   MCV 89.5 03/12/2021 1107   MCH 29.5 03/12/2021 1107   MCHC 33.0 03/12/2021 1107   RDW 12.2 03/12/2021 1107   LYMPHSABS 1.6 03/12/2021 1107   MONOABS 0.5 03/12/2021 1107   EOSABS 0.3 03/12/2021 1107   BASOSABS 0.1 03/12/2021 1107    CMP     Component Value Date/Time   NA 142 03/12/2021 1107   K 3.7 03/12/2021 1107   CL 104 03/12/2021 1107   CO2 28 03/12/2021 1107   GLUCOSE 143 (H) 03/12/2021 1107   BUN 13 03/12/2021 1107   CREATININE 0.99 03/12/2021 1107   CREATININE 0.81 01/11/2020 1209   CALCIUM 9.1 03/12/2021 1107   PROT 7.1 03/12/2021 1107   ALBUMIN 3.6 03/12/2021 1107   AST 14 (L) 03/12/2021 1107   AST 14 (L) 01/11/2020 1209   ALT 8 03/12/2021 1107   ALT 13 01/11/2020 1209   ALKPHOS 41 03/12/2021 1107   BILITOT 0.5 03/12/2021 1107   BILITOT 0.5 01/11/2020  1209   GFRNONAA >60 03/12/2021 1107   GFRNONAA >60 01/11/2020 1209   GFRAA >60 08/14/2020 1227   GFRAA >60 01/11/2020 1209   Colonoscopy 10 years ago: [facility], [place]:  Normal per patient  ASSESSMENT AND PLAN: 70 year old female with history of breast cancer recently completed chemotherapy who had been having worsening constipation for the last several months,  eventually requiring enemas.  Since then, she has found a regimen that works well for her (Daily Citrucel plus every other day MiraLAX) and she denies any significant constipation with this regimen.  No other chronic lower GI symptoms.  I recommended that she continue this regimen indefinitely. It seems unlikely that her cough is due to GERD given the absence of any other GERD symptoms and the lack of response to acid suppression, as well as the improvement of the cough with Symbicort.  I recommended that she stop taking the famotidine, but that if she did develop heartburn or acid regurgitation or if her cough worsen, she could restart it. Will patient for repeat colonoscopy and try to obtain the records from [facility].  If these records indicate that she is not due for a colonoscopy, we can cancel the procedure.   Constipation Continue citrucel daily, can do miralax daily as well  Cough, unlikely to be GERD related Stop famotidine  CRC screening Schedule for screening colonoscopy  The details, risks (including bleeding, perforation, infection, missed lesions, medication reactions and possible hospitalization or surgery if complications occur), benefits, and alternatives to colonoscopy with possible biopsy and possible polypectomy were discussed with the patient and she consents to proceed.   Genelda Roark E. Candis Schatz, MD [facility]    CC:  [name], MD

## 2021-08-01 DIAGNOSIS — J301 Allergic rhinitis due to pollen: Secondary | ICD-10-CM | POA: Diagnosis not present

## 2021-08-01 DIAGNOSIS — J3081 Allergic rhinitis due to animal (cat) (dog) hair and dander: Secondary | ICD-10-CM | POA: Diagnosis not present

## 2021-08-01 DIAGNOSIS — J3089 Other allergic rhinitis: Secondary | ICD-10-CM | POA: Diagnosis not present

## 2021-08-02 ENCOUNTER — Other Ambulatory Visit: Payer: Self-pay | Admitting: Family Medicine

## 2021-08-04 ENCOUNTER — Encounter: Payer: Self-pay | Admitting: Gastroenterology

## 2021-08-06 ENCOUNTER — Encounter: Payer: Self-pay | Admitting: Adult Health

## 2021-08-14 DIAGNOSIS — J301 Allergic rhinitis due to pollen: Secondary | ICD-10-CM | POA: Diagnosis not present

## 2021-08-14 DIAGNOSIS — J3081 Allergic rhinitis due to animal (cat) (dog) hair and dander: Secondary | ICD-10-CM | POA: Diagnosis not present

## 2021-08-14 DIAGNOSIS — J3089 Other allergic rhinitis: Secondary | ICD-10-CM | POA: Diagnosis not present

## 2021-08-28 DIAGNOSIS — J3081 Allergic rhinitis due to animal (cat) (dog) hair and dander: Secondary | ICD-10-CM | POA: Diagnosis not present

## 2021-08-28 DIAGNOSIS — J301 Allergic rhinitis due to pollen: Secondary | ICD-10-CM | POA: Diagnosis not present

## 2021-08-28 DIAGNOSIS — J3089 Other allergic rhinitis: Secondary | ICD-10-CM | POA: Diagnosis not present

## 2021-08-29 ENCOUNTER — Other Ambulatory Visit: Payer: Self-pay | Admitting: Family Medicine

## 2021-09-11 ENCOUNTER — Encounter: Payer: Self-pay | Admitting: Family Medicine

## 2021-09-12 DIAGNOSIS — J3089 Other allergic rhinitis: Secondary | ICD-10-CM | POA: Diagnosis not present

## 2021-09-12 DIAGNOSIS — J301 Allergic rhinitis due to pollen: Secondary | ICD-10-CM | POA: Diagnosis not present

## 2021-09-12 DIAGNOSIS — J3081 Allergic rhinitis due to animal (cat) (dog) hair and dander: Secondary | ICD-10-CM | POA: Diagnosis not present

## 2021-09-16 ENCOUNTER — Encounter: Payer: Self-pay | Admitting: Family Medicine

## 2021-09-17 ENCOUNTER — Ambulatory Visit: Payer: Medicare Other | Admitting: Oncology

## 2021-09-17 ENCOUNTER — Other Ambulatory Visit: Payer: Medicare Other

## 2021-09-17 DIAGNOSIS — Z452 Encounter for adjustment and management of vascular access device: Secondary | ICD-10-CM | POA: Diagnosis not present

## 2021-09-17 NOTE — Progress Notes (Signed)
Theresa Bradley  [phone] [phone]     ID: Theresa Bradley DOB: 1951-06-16  MR#: [id number]  YQM#:[id number]  Patient Care Team: [name], MD as PCP - General [name], [name], MD as Consulting Physician (Allergy and Immunology) [name], OD as Referring Physician [name], MD as Consulting Physician (General Surgery) Abron Neddo, [name], MD as Consulting Physician (Oncology) [name], MD as Attending Physician (Radiation Oncology) [name], MD as Referring Physician (Dermatology) [name], MD as Consulting Physician (Cardiology) Chauncey Cruel, MD OTHER MD:   CHIEF COMPLAINT: 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  She began tamoxifen on 07/31/2020.  Now that she is on gabapentin she is doing much better on tamoxifen and sleeps pretty much through the night.  She does not have any other side effects of significance  Since her last visit, she underwent repeat echocardiogram on 07/26/2021 showing an ejection fraction of 60-65%.    REVIEW OF SYSTEMS: Theresa Bradley walks about 2 days a week, about 30 minutes at a time.  She spends time with her grandchildren which she greatly enjoys.  She also helps take care of her mother who lives in [place].  She just had her port removed yesterday and tolerated that well.  She is scheduled for colonoscopy next week.  Overall a detailed review of systems today was stable   COVID 19 VACCINATION STATUS: Status post [place] x2+ booster in October 2021   HISTORY OF CURRENT ILLNESS: From the original intake note:   Theresa Bradley was my patient a little over 11 years ago when she underwent lumpectomy on 05/15/2008 for ductal carcinoma in situ and received radiation therapy under Dr. Valere Dross. 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  More recently, she presented to her PCP with a small palpable right breast lump around 11 o'clock, just superior and lateral to the nipple. She underwent bilateral diagnostic mammography with tomography and right breast ultrasonography at [facility] on 12/26/2019 showing: breast density category B; 6 mm superficial mass in the right breast at 11 o'clock in the retroareolar region; no enlarged adenopathy in right axilla.  Accordingly on 12/30/2019 she proceeded to biopsy of the right breast area in question. The pathology from this procedure ([id number]) showed: invasive ductal carcinoma, grade 2. Prognostic indicators significant for: estrogen receptor, 95% positive and progesterone receptor, 80% positive, both with strong staining intensity. Proliferation marker Ki67 at 20%. HER2 positive by immunohistochemistry (3+).  The patient's subsequent history is as detailed below.   PAST MEDICAL HISTORY: Past Medical History:  Diagnosis Date   Anxiety    Breast cancer ([place]) 2009   right   Cancer ([place])    COUGH, CHRONIC 05/10/2010   Dysrhythmia    occationally "skips a beat"   Family history of brain cancer    Family history of breast cancer    Family history of lung cancer    Family history of multiple myeloma    Family history of skin cancer    GERD (gastroesophageal reflux disease)    HYPERTENSION 04/16/2009   HYPOTHYROIDISM 04/16/2009   OSTEOARTHRITIS 04/16/2009   Personal history of radiation therapy    SENILE LENTIGO 06/10/2010    PAST SURGICAL HISTORY: Past  Surgical History:  Procedure Laterality Date   BREAST EXCISIONAL BIOPSY Right    BREAST EXCISIONAL BIOPSY Left    BREAST LUMPECTOMY Right 2009   radiation only   BREAST LUMPECTOMY WITH RADIOACTIVE SEED AND SENTINEL LYMPH NODE BIOPSY Right 01/31/2020   Procedure: RIGHT BREAST LUMPECTOMY WITH RADIOACTIVE SEED AND SENTINEL LYMPH  NODE BIOPSY;  Surgeon: [name], MD;  Location: [facility];  Service: General;  Laterality: Right;  [place]   BREAST SURGERY  2009   X 2, cancer stage 0, lumpectomy   CESAREAN SECTION     1979, [place]  11/2015   HERNIA REPAIR  2009   JOINT REPLACEMENT  2008   hip   MASTECTOMY Right    OPEN SURGICAL REPAIR OF GLUTEAL TENDON Left 07/18/2019   Procedure: Left hip bearing surface revision with gluteal tendon repair;  Surgeon: Gaynelle Arabian, MD;  Location: [facility];  Service: Orthopedics;  Laterality: Left;  2 hrs   PORTACATH PLACEMENT N/A 03/15/2020   Procedure: INSERTION PORT-A-CATH WITH ULTRASOUND GUIDANCE;  Surgeon: [name], MD;  Location: [facility];  Service: General;  Laterality: N/A;   SIMPLE MASTECTOMY WITH AXILLARY SENTINEL NODE BIOPSY Right 03/15/2020   Procedure: RIGHT SIMPLE MASTECTOMY;  Surgeon: [name], MD;  Location: [facility];  Service: General;  Laterality: Right;    FAMILY HISTORY: Family History  Problem Relation Age of Onset   Skin cancer Mother    Hyperlipidemia Father    Dementia Father    Skin cancer Father    Heart attack Father    Diabetes Brother    Hypertension Brother    Breast cancer Maternal Aunt 40   Breast cancer Maternal Aunt        dx. mid-70s   Cancer Paternal Aunt        unknown type, dx. >50   Lung cancer Paternal Uncle    Multiple myeloma Paternal Uncle    Brain cancer Cousin        dx. 27s, paternal cousin   Arthritis Other    Hypertension Other    Cancer Other        2 aunts   Colon cancer Neg Hx    Esophageal cancer Neg Hx    Pancreatic cancer Neg Hx    Stomach cancer Neg Hx   The patient's father is 76 years old and the patient's mother is 4 years old as of February 2021.  The patient has one brother, no sisters.  A maternal aunt was diagnosed with breast cancer at age 73.  A paternal uncle was  diagnosed with lung cancer in his 12s and another paternal uncle with a "blood cancer" in his late 73s.  There is no history of ovarian pancreatic or prostate cancer in the family to her knowledge.   GYNECOLOGIC HISTORY:  No LMP recorded. Patient is postmenopausal. Menarche: 70 years old Age at first live birth: 70 years old [place] P 2 LMP HRT no  Hysterectomy?  No BSO?  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  Her husband Patrick Jupiter runs an environmental company that for example checks for mold and then remediates.  Son Delfino Lovett, 66 years old, lives in [place] and works as a Clinical biochemist.  He has 2 sons.  The patient's son Catalina Antigua 42 lives in  [place] and works in Chief Executive Officer.  He married October 2021.  The patient is Episcopalian    ADVANCED DIRECTIVES: In the absence of any documentation to the contrary, the patient's spouse is their HCPOA.    HEALTH MAINTENANCE: Social History   Tobacco Use   Smoking status: Never   Smokeless tobacco: Never   Tobacco comments:    father smoked when she was younger   Vaping Use   Vaping Use: Never used  Substance Use Topics   Alcohol use: Yes    Comment: occasionally    Drug use: No     Colonoscopy: 03/2009/[facility]  PAP: 10/2013, negative  Bone density: 11/2015, T score -1.7   Allergies  Allergen Reactions   Codeine Sulfate Nausea And Vomiting   Erythromycin Base Other (See Comments)    Stomach ache   Esomeprazole Magnesium     REACTION: GI upset    Current Outpatient Medications  Medication Sig Dispense Refill   ALPRAZolam (XANAX) 0.25 MG tablet TAKE 1/2 A TABLET BY MOUTH AT BEDTIME AS NEEDED FOR INSOMNIA 45 tablet 0   amLODipine (NORVASC) 5 MG tablet TAKE 1 TABLET BY MOUTH EVERY DAY 90 tablet 3   BIOTIN 5000 PO Take 1 tablet by mouth daily.     budesonide-formoterol (SYMBICORT) 80-4.5 MCG/ACT inhaler Inhale 1 puff  into the lungs daily in the afternoon.     CALCIUM-VITAMIN D PO Take 2 tablets by mouth daily.     citalopram (CELEXA) 20 MG tablet TAKE 1 TABLET BY MOUTH EVERY DAY 90 tablet 3   EPIPEN 2-PAK 0.3 MG/0.3ML SOAJ injection Inject 0.3 mg into the muscle as needed for anaphylaxis.      gabapentin (NEURONTIN) 300 MG capsule Take 1 capsule (300 mg total) by mouth at bedtime. 90 capsule 4   levothyroxine (SYNTHROID) 50 MCG tablet TAKE 1 TABLET BY MOUTH EVERY DAY 90 tablet 0   meloxicam (MOBIC) 15 MG tablet TAKE 1 TABLET BY MOUTH EVERY DAY 90 tablet 0   Methylcellulose, Laxative, (CITRUCEL PO) Take 2 tablets by mouth at bedtime.     polyethylene glycol (MIRALAX / GLYCOLAX) 17 g packet Take 17 g by mouth every other day.     tamoxifen (NOLVADEX) 20 MG tablet Take 20 mg by mouth daily.     tretinoin (RETIN-A) 0.025 % cream Apply topically at bedtime. 45 g 6   No current facility-administered medications for this visit.    OBJECTIVE: White woman in no acute distress  Vitals:   09/18/21 1217  BP: (!) 143/54  Pulse: (!) 56  Resp: 18  Temp: (!) 97.5 F (36.4 C)  SpO2: 100%     Body mass index is 26.49 kg/m.   Wt Readings from Last 3 Encounters:  09/18/21 154 lb 4.8 oz (70 kg)  07/31/21 150 lb 6 oz (68.2 kg)  07/26/21 150 lb 6.4 oz (68.2 kg)     ECOG FS:1 - Symptomatic but completely ambulatory  Sclerae unicteric, EOMs intact Wearing a mask No cervical or supraclavicular adenopathy Lungs no rales or rhonchi Heart regular rate and rhythm Abd soft, nontender, positive bowel sounds MSK no focal spinal tenderness, no upper extremity lymphedema Neuro: nonfocal, well oriented, appropriate affect Breasts: The right breast is status postmastectomy.  There is no evidence of chest wall recurrence.  The left breast is benign.  Both axillae are benign.  LAB RESULTS:  CMP     Component Value Date/Time   NA 142 03/12/2021 1107   K 3.7 03/12/2021 1107  CL 104 03/12/2021 1107   CO2 28 03/12/2021  1107   GLUCOSE 143 (H) 03/12/2021 1107   BUN 13 03/12/2021 1107   CREATININE 0.99 03/12/2021 1107   CREATININE 0.81 01/11/2020 1209   CALCIUM 9.1 03/12/2021 1107   PROT 7.1 03/12/2021 1107   ALBUMIN 3.6 03/12/2021 1107   AST 14 (L) 03/12/2021 1107   AST 14 (L) 01/11/2020 1209   ALT 8 03/12/2021 1107   ALT 13 01/11/2020 1209   ALKPHOS 41 03/12/2021 1107   BILITOT 0.5 03/12/2021 1107   BILITOT 0.5 01/11/2020 1209   GFRNONAA >60 03/12/2021 1107   GFRNONAA >60 01/11/2020 1209   GFRAA >60 08/14/2020 1227   GFRAA >60 01/11/2020 1209    No results found for: TOTALPROTELP, ALBUMINELP, A1GS, A2GS, BETS, BETA2SER, GAMS, MSPIKE, SPEI  Lab Results  Component Value Date   WBC 5.5 09/18/2021   NEUTROABS 2.9 09/18/2021   HGB 12.9 09/18/2021   HCT 39.6 09/18/2021   MCV 91.7 09/18/2021   PLT 258 09/18/2021    No results found for: LABCA2  No components found for: HUTMLY650  No results for input(s): INR in the last 168 hours.  No results found for: LABCA2  No results found for: PTW656  No results found for: CLE751  No results found for: ZGY174  No results found for: CA2729  No components found for: HGQUANT  No results found for: CEA1 / No results found for: CEA1   No results found for: AFPTUMOR  No results found for: CHROMOGRNA  No results found for: KPAFRELGTCHN, LAMBDASER, KAPLAMBRATIO (kappa/lambda light chains)  No results found for: HGBA, HGBA2QUANT, HGBFQUANT, HGBSQUAN (Hemoglobinopathy evaluation)   No results found for: LDH  No results found for: IRON, TIBC, IRONPCTSAT (Iron and TIBC)  No results found for: FERRITIN  Urinalysis    Component Value Date/Time   COLORURINE yellow 05/24/2010 0855   APPEARANCEUR Clear 05/24/2010 0855   LABSPEC 1.015 05/24/2010 0855   PHURINE 6.0 05/24/2010 0855   GLUCOSEU NEGATIVE 09/22/2007 1110   HGBUR negative 05/24/2010 0855   BILIRUBINUR n 06/29/2019 1533   KETONESUR NEGATIVE 09/22/2007 1110   PROTEINUR Negative  06/29/2019 [place] 09/22/2007 1110   UROBILINOGEN 0.2 06/29/2019 1533   UROBILINOGEN 0.2 05/24/2010 0855   NITRITE n 06/29/2019 1533   NITRITE negative 05/24/2010 0855   LEUKOCYTESUR Negative 06/29/2019 1533    STUDIES: No results found.    ELIGIBLE FOR AVAILABLE RESEARCH PROTOCOL: no  ASSESSMENT: 70 y.o. [place] woman status post right breast periareolar biopsy 12/30/2019 for a clinical T1a N0, stage I invasive ductal carcinoma, grade 2, estrogen and progesterone receptor positive, HER-2 amplified, with an MIB-one of 20%.  (1) history of prior right lumpectomy June 2009 for ductal carcinoma in situ  (a) status post adjuvant radiation  (b) did not receive antiestrogens  (2) status post repeat right lumpectomy and sentinel lymph node sampling 01/31/2020 for a pT1c pN0, stage IA invasive ductal carcinoma, grade 2, with positive margins  (a) total one sentinel node removed  (3) s/p right mastectomy 03/15/2020 showing residual invasive ductal carcinoma measuring 0.9 cm, but negative margins (added to prior 1.1 lumpectomy, still T1 N0 or stage IA)  (a) one additional axillary node was removed (total 2 right axillary nodes removed)  (3) adjuvant chemotherapy with paclitaxel and trastuzumab weekly x 12 started 04/24/2020, completing 10 doses 06/26/2020   (a) final 2 doses omitted secondary to peripheral neuropathy  (4) trastuzumab to be continued every 21 days to total  one year (through May 2022).  (a) echo 01/17/2020 shows an ejection fraction in the 60-65% range  (b) echo 05/30/2020 shows an ejection fraction of 60-65%.  (c) echo 08/28/2020 shows an ejection fraction in the 55-60% range  (d) echo 12/14/2020 shows an ejection fraction in the 60-65% range.  (e) echo 03/26/2021 shows an EF in the 55% range with decreased GLS  (f) repeat echo 07/26/2021 shows the left ventricular ejection fraction back to 60-65%  (5) started tamoxifen 07/31/2020  (a) Bone density in  11/2015 shows osteopenia with T score of -1.7.  (b) bone density 03/04/2021 shows a T score of -2.0  (6) genetics testing 02/01/2020 through the [facility] STAT panel found no deleterious mutations then ATM, BRCA1, BRCA2, CDH1, CHEK2, PALB2, PTEN, STK11 and TP53.    PLAN: [facility] is now a year and a half out from definitive surgery for her breast cancer with no evidence of disease recurrence.  This is very favorable.  She is tolerating tamoxifen well and the plan will be to continue that a minimum of 5 years.  She had her port removed which is major milestone.  I have encouraged her to exercise more regularly.  As stated she is updating her health maintenance and will have a colonoscopy next week.  She has not had gynecologic evaluation in many years.  However all her Paps smears in the past have been normal and she is monogamous.  I did let her know that if she develops any spotting that needs to be evaluated aggressively.  Otherwise she will return to see [place] in June 2023 after her May mammography  She knows to call for any other issue that may develop before then  Total encounter time 25 minutes.Sarajane Jews C. Ranyah Groeneveld, MD 09/18/21 12:24 PM Medical Oncology and Hematology [facility] [place], [place] [address] Tel. [phone]    Fax. [phone]   I, Wilburn Mylar, am acting as scribe for Dr. [name]. Garv Kuechle.  I, Lurline Del MD, have reviewed the above documentation for accuracy and completeness, and I agree with the above.   *Total Encounter Time as defined by the Centers for Medicare and Medicaid Services includes, in addition to the face-to-face time of a patient visit (documented in the note above) non-face-to-face time: obtaining and reviewing outside history, ordering and reviewing medications, tests or procedures, care coordination (communications with other health care professionals or caregivers) and documentation in the  medical record.

## 2021-09-18 ENCOUNTER — Inpatient Hospital Stay: Payer: Medicare Other | Attending: Oncology

## 2021-09-18 ENCOUNTER — Inpatient Hospital Stay (HOSPITAL_BASED_OUTPATIENT_CLINIC_OR_DEPARTMENT_OTHER): Payer: Medicare Other | Admitting: Oncology

## 2021-09-18 ENCOUNTER — Other Ambulatory Visit: Payer: Self-pay

## 2021-09-18 VITALS — BP 143/54 | HR 56 | Temp 97.5°F | Resp 18 | Ht 64.0 in | Wt 154.3 lb

## 2021-09-18 DIAGNOSIS — Z79899 Other long term (current) drug therapy: Secondary | ICD-10-CM | POA: Insufficient documentation

## 2021-09-18 DIAGNOSIS — C50411 Malignant neoplasm of upper-outer quadrant of right female breast: Secondary | ICD-10-CM | POA: Diagnosis not present

## 2021-09-18 DIAGNOSIS — Z170 Estrogen receptor positive status [ER+]: Secondary | ICD-10-CM | POA: Insufficient documentation

## 2021-09-18 DIAGNOSIS — Z79810 Long term (current) use of selective estrogen receptor modulators (SERMs): Secondary | ICD-10-CM | POA: Insufficient documentation

## 2021-09-18 LAB — CBC WITH DIFFERENTIAL/PLATELET
Abs Immature Granulocytes: 0.02 10*3/uL (ref 0.00–0.07)
Basophils Absolute: 0 10*3/uL (ref 0.0–0.1)
Basophils Relative: 1 %
Eosinophils Absolute: 0.3 10*3/uL (ref 0.0–0.5)
Eosinophils Relative: 5 %
HCT: 39.6 % (ref 36.0–46.0)
Hemoglobin: 12.9 g/dL (ref 12.0–15.0)
Immature Granulocytes: 0 %
Lymphocytes Relative: 33 %
Lymphs Abs: 1.8 10*3/uL (ref 0.7–4.0)
MCH: 29.9 pg (ref 26.0–34.0)
MCHC: 32.6 g/dL (ref 30.0–36.0)
MCV: 91.7 fL (ref 80.0–100.0)
Monocytes Absolute: 0.4 10*3/uL (ref 0.1–1.0)
Monocytes Relative: 8 %
Neutro Abs: 2.9 10*3/uL (ref 1.7–7.7)
Neutrophils Relative %: 53 %
Platelets: 258 10*3/uL (ref 150–400)
RBC: 4.32 MIL/uL (ref 3.87–5.11)
RDW: 12.4 % (ref 11.5–15.5)
WBC: 5.5 10*3/uL (ref 4.0–10.5)
nRBC: 0 % (ref 0.0–0.2)

## 2021-09-18 LAB — COMPREHENSIVE METABOLIC PANEL
ALT: 6 U/L (ref 0–44)
AST: 13 U/L — ABNORMAL LOW (ref 15–41)
Albumin: 3.6 g/dL (ref 3.5–5.0)
Alkaline Phosphatase: 36 U/L — ABNORMAL LOW (ref 38–126)
Anion gap: 10 (ref 5–15)
BUN: 14 mg/dL (ref 8–23)
CO2: 27 mmol/L (ref 22–32)
Calcium: 8.9 mg/dL (ref 8.9–10.3)
Chloride: 104 mmol/L (ref 98–111)
Creatinine, Ser: 0.86 mg/dL (ref 0.44–1.00)
GFR, Estimated: >60 mL/min (ref >60)
Glucose, Bld: 84 mg/dL (ref 70–99)
Potassium: 3.9 mmol/L (ref 3.5–5.1)
Sodium: 141 mmol/L (ref 135–145)
Total Bilirubin: 0.4 mg/dL (ref 0.3–1.2)
Total Protein: 7.1 g/dL (ref 6.5–8.1)

## 2021-09-18 MED ORDER — GABAPENTIN 300 MG PO CAPS: 300.0000 mg | ORAL_CAPSULE | Freq: Every day | ORAL | 4 refills | Status: DC

## 2021-09-18 MED ORDER — TAMOXIFEN CITRATE 20 MG PO TABS: 20.0000 mg | ORAL_TABLET | Freq: Every day | ORAL | 4 refills | Status: DC

## 2021-09-19 DIAGNOSIS — J3081 Allergic rhinitis due to animal (cat) (dog) hair and dander: Secondary | ICD-10-CM | POA: Diagnosis not present

## 2021-09-19 DIAGNOSIS — J301 Allergic rhinitis due to pollen: Secondary | ICD-10-CM | POA: Diagnosis not present

## 2021-09-19 DIAGNOSIS — J3089 Other allergic rhinitis: Secondary | ICD-10-CM | POA: Diagnosis not present

## 2021-09-24 ENCOUNTER — Telehealth: Payer: Self-pay | Admitting: Gastroenterology

## 2021-09-24 ENCOUNTER — Other Ambulatory Visit: Payer: Self-pay

## 2021-09-24 DIAGNOSIS — C50911 Malignant neoplasm of unspecified site of right female breast: Secondary | ICD-10-CM | POA: Diagnosis not present

## 2021-09-24 MED ORDER — CLENPIQ 10-3.5-12 MG-GM -GM/160ML PO SOLN: 320.0000 mL | Freq: Once | ORAL | 0 refills | Status: AC

## 2021-09-24 NOTE — Telephone Encounter (Signed)
Inbound call from patient. Have procedure 11/4. States clenpiq is not at her pharmacy

## 2021-09-24 NOTE — Telephone Encounter (Signed)
Resent Clenpiq to patient's pharmacy.

## 2021-09-25 DIAGNOSIS — J3089 Other allergic rhinitis: Secondary | ICD-10-CM | POA: Diagnosis not present

## 2021-09-25 DIAGNOSIS — J3081 Allergic rhinitis due to animal (cat) (dog) hair and dander: Secondary | ICD-10-CM | POA: Diagnosis not present

## 2021-09-25 DIAGNOSIS — J301 Allergic rhinitis due to pollen: Secondary | ICD-10-CM | POA: Diagnosis not present

## 2021-09-27 ENCOUNTER — Other Ambulatory Visit: Payer: Self-pay

## 2021-09-27 ENCOUNTER — Encounter: Payer: Self-pay | Admitting: Gastroenterology

## 2021-09-27 ENCOUNTER — Ambulatory Visit (AMBULATORY_SURGERY_CENTER): Payer: Medicare Other | Admitting: Gastroenterology

## 2021-09-27 VITALS — BP 122/65 | HR 62 | Temp 98.9°F | Resp 13 | Ht 64.0 in | Wt 150.0 lb

## 2021-09-27 DIAGNOSIS — Z1212 Encounter for screening for malignant neoplasm of rectum: Secondary | ICD-10-CM | POA: Diagnosis not present

## 2021-09-27 DIAGNOSIS — Z1211 Encounter for screening for malignant neoplasm of colon: Secondary | ICD-10-CM

## 2021-09-27 DIAGNOSIS — D124 Benign neoplasm of descending colon: Secondary | ICD-10-CM

## 2021-09-27 DIAGNOSIS — K5900 Constipation, unspecified: Secondary | ICD-10-CM

## 2021-09-27 MED ORDER — SODIUM CHLORIDE 0.9 % IV SOLN: 500.0000 mL | Freq: Once | INTRAVENOUS | Status: DC

## 2021-09-27 NOTE — Progress Notes (Signed)
[facility] History and Physical   Primary Care Physician:  [name], MD   Reason for Procedure:   Colon cancer screening  Plan:    Screening colonoscopy     HPI: Theresa Bradley is a 70 y.o. female here for average risk screening colonoscopy.  She underwent a colonoscopy 10 years ago at [facility] which was normal per patient.  She has chronic constipation, but no other chronic lower GI symptoms and no family history of colon cancer.   Past Medical History:  Diagnosis Date   Anxiety    Breast cancer ([facility]) 2009   right   Cancer ([place])    COUGH, CHRONIC 05/10/2010   Dysrhythmia    occationally "skips a beat"   Family history of brain cancer    Family history of breast cancer    Family history of lung cancer    Family history of multiple myeloma    Family history of skin cancer    GERD (gastroesophageal reflux disease)    HYPERTENSION 04/16/2009   HYPOTHYROIDISM 04/16/2009   OSTEOARTHRITIS 04/16/2009   Personal history of radiation therapy    SENILE LENTIGO 06/10/2010    Past Surgical History:  Procedure Laterality Date   BREAST EXCISIONAL BIOPSY Right    BREAST EXCISIONAL BIOPSY Left    BREAST LUMPECTOMY Right 2009   radiation only   BREAST LUMPECTOMY WITH RADIOACTIVE SEED AND SENTINEL LYMPH NODE BIOPSY Right 01/31/2020   Procedure: RIGHT BREAST LUMPECTOMY WITH RADIOACTIVE SEED AND SENTINEL LYMPH NODE BIOPSY;  Surgeon: [name], MD;  Location: [facility];  Service: General;  Laterality: Right;  [place]  2009   X 2, cancer stage 0, lumpectomy   CESAREAN SECTION     1979, [place]  11/2015   HERNIA REPAIR  2009   JOINT REPLACEMENT  2008   hip   MASTECTOMY Right    OPEN SURGICAL REPAIR OF GLUTEAL TENDON Left 07/18/2019   Procedure: Left hip bearing surface revision with gluteal tendon repair;  Surgeon: Gaynelle Arabian, MD;  Location: [facility];  Service: Orthopedics;  Laterality: Left;  2 hrs   PORTACATH PLACEMENT N/A 03/15/2020   Procedure: INSERTION PORT-A-CATH WITH ULTRASOUND GUIDANCE;  Surgeon: [name], MD;  Location: [facility];  Service: General;  Laterality: N/A;   SIMPLE MASTECTOMY WITH AXILLARY SENTINEL NODE BIOPSY Right 03/15/2020   Procedure: RIGHT SIMPLE MASTECTOMY;  Surgeon: [name], MD;  Location: [facility];  Service: General;  Laterality: Right;    Prior to Admission medications   Medication Sig Start Date End Date Taking? Authorizing Provider  ALPRAZolam Duanne Moron) 0.25 MG tablet TAKE 1/2 A TABLET BY MOUTH AT BEDTIME AS NEEDED FOR INSOMNIA 12/27/20  Yes [name], [name], MD  amLODipine (NORVASC) 5 MG tablet TAKE 1 TABLET BY MOUTH EVERY DAY 06/03/21  Yes [name], [name], MD  budesonide-formoterol (SYMBICORT) 80-4.5 MCG/ACT inhaler Inhale 1 puff into the lungs daily in the afternoon.   Yes [provider]  CALCIUM-VITAMIN D PO Take 2 tablets by mouth daily.   Yes [provider]  citalopram (CELEXA) 20 MG tablet TAKE 1 TABLET BY MOUTH EVERY DAY 02/25/21  Yes [name], [name], MD  gabapentin (NEURONTIN) 300 MG capsule Take 1 capsule (300 mg total) by mouth at bedtime. 09/18/21  Yes [name], [name], MD  levothyroxine (SYNTHROID) 50 MCG  tablet TAKE 1 TABLET BY MOUTH EVERY DAY 08/02/21  Yes [name], [name], MD  meloxicam (MOBIC) 15 MG tablet TAKE 1 TABLET BY MOUTH EVERY DAY 08/29/21  Yes [name], [name], MD  polyethylene glycol (MIRALAX / GLYCOLAX) 17 g packet Take 17 g by mouth every other day.   Yes [provider]  tamoxifen (NOLVADEX) 20 MG tablet Take 1 tablet (20 mg total) by mouth daily. 09/18/21  Yes [name], [name], MD  BIOTIN 5000 PO Take 1 tablet by mouth daily.    [provider]  EPIPEN 2-PAK 0.3 MG/0.3ML SOAJ injection Inject 0.3 mg into the muscle as needed for anaphylaxis.  03/08/14    [provider]  Methylcellulose, Laxative, (CITRUCEL PO) Take 2 tablets by mouth at bedtime. Patient not taking: Reported on 09/27/2021    [provider]  tretinoin (RETIN-A) 0.025 % cream Apply topically at bedtime. Patient not taking: Reported on 09/27/2021 11/06/15   [name], MD  prochlorperazine (COMPAZINE) 10 MG tablet Take 1 tablet (10 mg total) by mouth every 6 (six) hours as needed (Nausea or vomiting). 04/01/20 07/03/20  [name], [name], MD    Current Outpatient Medications  Medication Sig Dispense Refill   ALPRAZolam (XANAX) 0.25 MG tablet TAKE 1/2 A TABLET BY MOUTH AT BEDTIME AS NEEDED FOR INSOMNIA 45 tablet 0   amLODipine (NORVASC) 5 MG tablet TAKE 1 TABLET BY MOUTH EVERY DAY 90 tablet 3   budesonide-formoterol (SYMBICORT) 80-4.5 MCG/ACT inhaler Inhale 1 puff into the lungs daily in the afternoon.     CALCIUM-VITAMIN D PO Take 2 tablets by mouth daily.     citalopram (CELEXA) 20 MG tablet TAKE 1 TABLET BY MOUTH EVERY DAY 90 tablet 3   gabapentin (NEURONTIN) 300 MG capsule Take 1 capsule (300 mg total) by mouth at bedtime. 90 capsule 4   levothyroxine (SYNTHROID) 50 MCG tablet TAKE 1 TABLET BY MOUTH EVERY DAY 90 tablet 0   meloxicam (MOBIC) 15 MG tablet TAKE 1 TABLET BY MOUTH EVERY DAY 90 tablet 0   polyethylene glycol (MIRALAX / GLYCOLAX) 17 g packet Take 17 g by mouth every other day.     tamoxifen (NOLVADEX) 20 MG tablet Take 1 tablet (20 mg total) by mouth daily. 90 tablet 4   BIOTIN 5000 PO Take 1 tablet by mouth daily.     EPIPEN 2-PAK 0.3 MG/0.3ML SOAJ injection Inject 0.3 mg into the muscle as needed for anaphylaxis.      Methylcellulose, Laxative, (CITRUCEL PO) Take 2 tablets by mouth at bedtime. (Patient not taking: Reported on 09/27/2021)     tretinoin (RETIN-A) 0.025 % cream Apply topically at bedtime. (Patient not taking: Reported on 09/27/2021) 45 g 6   Current Facility-Administered Medications  Medication Dose Route Frequency Provider  Last Rate Last Admin   0.9 %  sodium chloride infusion  500 mL Intravenous Once Daryel November, MD        Allergies as of 09/27/2021 - Review Complete 09/27/2021  Allergen Reaction Noted   Codeine sulfate Nausea And Vomiting 04/16/2009   Erythromycin base Other (See Comments) 04/16/2009   Esomeprazole magnesium  06/10/2010    Family History  Problem Relation Age of Onset   Skin cancer Mother    Hyperlipidemia Father    Dementia Father    Skin cancer Father    Heart attack Father    Diabetes Brother    Hypertension Brother    Breast cancer Maternal Aunt 63   Breast cancer Maternal  Aunt        dx. mid-70s   Cancer Paternal Aunt        unknown type, dx. >50   Lung cancer Paternal Uncle    Multiple myeloma Paternal Uncle    Brain cancer Cousin        dx. 54s, paternal cousin   Arthritis Other    Hypertension Other    Cancer Other        2 aunts   Colon cancer Neg Hx    Esophageal cancer Neg Hx    Pancreatic cancer Neg Hx    Stomach cancer Neg Hx     Social History   Socioeconomic History   Marital status: Married    Spouse name: Not on file   Number of children: 2   Years of education: Not on file   Highest education level: Bachelor's degree (e.g., BA, AB, BS)  Occupational History   Occupation: Cabin crew    Comment: Full-time   Occupation: Science writer: [employer]    Comment: Retired  Tobacco Use   Smoking status: Never   Smokeless tobacco: Never   Tobacco comments:    father smoked when she was younger   Media planner   Vaping Use: Never used  Substance and Sexual Activity   Alcohol use: Yes    Comment: occasionally    Drug use: No   Sexual activity: Not on file  Other Topics Concern   Not on file  Social History Narrative   Lives with husband on 2 level house, has two sons, one local with grandkids   Works FT as Cabin crew; Psychiatrist   Enjoys working out at gym about 2X/week, attends silver sneakers classes   Dad struggles with  dementia, mother still living who is caretaker; occasionally travels to visit to assist mother.   Social Determinants of Health   Financial Resource Strain: Low Risk    Difficulty of Paying Living Expenses: Not very hard  Food Insecurity: No Food Insecurity   Worried About Charity fundraiser in the Last Year: Never true   Ran Out of Food in the Last Year: Never true  Transportation Needs: No Transportation Needs   Lack of Transportation (Medical): No   Lack of Transportation (Non-Medical): No  Physical Activity: Insufficiently Active   Days of Exercise per Week: 1 day   Minutes of Exercise per Session: 30 min  Stress: No Stress Concern Present   Feeling of Stress : Only a little  Social Connections: Unknown   Frequency of Communication with Friends and Family: More than three times a week   Frequency of Social Gatherings with Friends and Family: Twice a week   Attends Religious Services: Patient refused   Marine scientist or Organizations: Patient refused   Attends Music therapist: 1 to 4 times per year   Marital Status: Married  Human resources officer Violence: Not At Risk   Fear of Current or Ex-Partner: No   Emotionally Abused: No   Physically Abused: No   Sexually Abused: No    Review of Systems: All other review of systems negative except as mentioned in the HPI.  Physical Exam: Vital signs BP (!) 105/44   Pulse (!) 47   Temp 98.9 F (37.2 C) (Temporal)   Ht '5\' 4"'  (1.626 m)   Wt 150 lb (68 kg)   SpO2 95%   BMI 25.75 kg/m   General:   Alert,  Well-developed, well-nourished, pleasant  and cooperative in NAD. MP2 Lungs:  Clear throughout to auscultation.   Heart:  Regular rate and rhythm; no murmurs, clicks, rubs,  or gallops. Abdomen:  Soft, nontender and nondistended. Normal bowel sounds.   Neuro/Psych:  Normal mood and affect. A and O x 3   Haji Delaine E. Candis Schatz, MD [facility]

## 2021-09-27 NOTE — Op Note (Signed)
[facility] Patient Name: Theresa Bradley Procedure Date: 09/27/2021 7:57 AM MRN: [id number] Endoscopist: Nicki Reaper E. Candis Schatz , MD Age: 70 Referring MD:  Date of Birth: 18-Jun-1951 Gender: Female Account #: [id number] Procedure:                Colonoscopy Indications:              Screening for colorectal malignant neoplasm Medicines:                Monitored Anesthesia Care Procedure:                Pre-Anesthesia Assessment:                           - Prior to the procedure, a History and Physical                            was performed, and patient medications and                            allergies were reviewed. The patient's tolerance of                            previous anesthesia was also reviewed. The risks                            and benefits of the procedure and the sedation                            options and risks were discussed with the patient.                            All questions were answered, and informed consent                            was obtained. Prior Anticoagulants: The patient has                            taken no previous anticoagulant or antiplatelet                            agents. ASA Grade Assessment: II - A patient with                            mild systemic disease. After reviewing the risks                            and benefits, the patient was deemed in                            satisfactory condition to undergo the procedure.                           After obtaining informed consent, the colonoscope  was passed under direct vision. Throughout the                            procedure, the patient's blood pressure, pulse, and                            oxygen saturations were monitored continuously. The                            CF [id number] #[id number] was introduced through the anus                            and advanced to the the transverse colon. The                            colonoscopy was  extremely difficult due to                            inadequate bowel prep, poor endoscopic                            visualization, restricted mobility of the left                            colon, significant looping and a tortuous colon.                            Successful completion of the procedure was not                            possible despite manual pressure, extra personnel                            applying manual pressure, supine positioning,                            withdrawal and reinsertion, water immersion, and                            applying a compression belt device around the                            abdomen. The patient tolerated the procedure well.                            The quality of the bowel preparation was                            inadequate. The rectum was photographed. Scope In: Scope Out: Findings:                 The perianal and digital rectal examinations were                            normal. Pertinent  negatives include normal                            sphincter tone and no palpable rectal lesions.                           A 6 mm polyp was found in the descending colon. The                            polyp was sessile. The polyp was removed with a                            cold snare. Resection and retrieval were complete.                            Estimated blood loss was minimal.                           Many small and large-mouthed diverticula were found                            in the sigmoid colon and descending colon. There                            was narrowing of the colon in association with the                            diverticular opening. There was no evidence of                            diverticular bleeding.                           The retroflexed view of the distal rectum and anal                            verge was normal and showed no anal or rectal                             abnormalities. Complications:            No immediate complications. Estimated Blood Loss:     Estimated blood loss: none. Impression:               - Preparation of the colon was inadequate.                           - Incomplete colonoscopy due to excessive looping,                            not remedied despite the above mentioned                            interventions                           -  One 6 mm polyp in the descending colon, removed                            with a cold snare. Resected and retrieved.                           - Moderate diverticulosis in the sigmoid colon and                            in the descending colon. There was narrowing of the                            colon in association with the diverticular opening.                            There was no evidence of diverticular bleeding.                           - The distal rectum and anal verge are normal on                            retroflexion view. Recommendation:           - Patient has a contact number available for                            emergencies. The signs and symptoms of potential                            delayed complications were discussed with the                            patient. Return to normal activities tomorrow.                            Written discharge instructions were provided to the                            patient.                           - Resume previous diet.                           - Continue present medications.                           - Await pathology results.                           - Perform a virtual colonoscopy at appointment to                            be scheduled. Milyn Stapleton E. Candis Schatz, MD 09/27/2021 8:59:09 AM This report has been signed electronically.

## 2021-09-27 NOTE — Progress Notes (Signed)
Called to room to assist during endoscopic procedure.  Patient ID and intended procedure confirmed with present staff. Received instructions for my participation in the procedure from the performing physician.   0230 Abdominal binder applied and abdominal pressure applied

## 2021-09-27 NOTE — Patient Instructions (Signed)
Discharge instructions given. Handouts on polyps and Diverticulosis. Resume previous medications. Office will schedule a virtual colonoscopy. YOU HAD AN ENDOSCOPIC PROCEDURE TODAY AT [facility]:   Refer to the procedure report that was given to you for any specific questions about what was found during the examination.  If the procedure report does not answer your questions, please call your gastroenterologist to clarify.  If you requested that your care partner not be given the details of your procedure findings, then the procedure report has been included in a sealed envelope for you to review at your convenience later.  YOU SHOULD EXPECT: Some feelings of bloating in the abdomen. Passage of more gas than usual.  Walking can help get rid of the air that was put into your GI tract during the procedure and reduce the bloating. If you had a lower endoscopy (such as a colonoscopy or flexible sigmoidoscopy) you may notice spotting of blood in your stool or on the toilet paper. If you underwent a bowel prep for your procedure, you may not have a normal bowel movement for a few days.  Please Note:  You might notice some irritation and congestion in your nose or some drainage.  This is from the oxygen used during your procedure.  There is no need for concern and it should clear up in a day or so.  SYMPTOMS TO REPORT IMMEDIATELY:  Following lower endoscopy (colonoscopy or flexible sigmoidoscopy):  Excessive amounts of blood in the stool  Significant tenderness or worsening of abdominal pains  Swelling of the abdomen that is new, acute  Fever of 100F or higher   For urgent or emergent issues, a gastroenterologist can be reached at any hour by calling [phone]. Do not use MyChart messaging for urgent concerns.    DIET:  We do recommend a small meal at first, but then you may proceed to your regular diet.  Drink plenty of fluids but you should avoid alcoholic beverages for 24  hours.  ACTIVITY:  You should plan to take it easy for the rest of today and you should NOT DRIVE or use heavy machinery until tomorrow (because of the sedation medicines used during the test).    FOLLOW UP: Our staff will call the number listed on your records 48-72 hours following your procedure to check on you and address any questions or concerns that you may have regarding the information given to you following your procedure. If we do not reach you, we will leave a message.  We will attempt to reach you two times.  During this call, we will ask if you have developed any symptoms of COVID 19. If you develop any symptoms (ie: fever, flu-like symptoms, shortness of breath, cough etc.) before then, please call [phone].  If you test positive for Covid 19 in the 2 weeks post procedure, please call and report this information to [place].    If any biopsies were taken you will be contacted by phone or by letter within the next 1-3 weeks.  Please call us at [phone] if you have not heard about the biopsies in 3 weeks.    SIGNATURES/CONFIDENTIALITY: You and/or your care partner have signed paperwork which will be entered into your electronic medical record.  These signatures attest to the fact that that the information above on your After Visit Summary has been reviewed and is understood.  Full responsibility of the confidentiality of this discharge information lies with you and/or your care-partner.

## 2021-09-27 NOTE — Progress Notes (Signed)
VS completed by AG.   Pt's states no medical or surgical changes since previsit or office visit.

## 2021-09-27 NOTE — Progress Notes (Signed)
PT taken to PACU. Monitors in place. VSS. Report given to RN. 

## 2021-09-27 NOTE — Progress Notes (Signed)
Abdominal binder applied by staff to help with ongoing abdominal pressure.

## 2021-09-30 ENCOUNTER — Other Ambulatory Visit: Payer: Self-pay

## 2021-09-30 ENCOUNTER — Ambulatory Visit (INDEPENDENT_AMBULATORY_CARE_PROVIDER_SITE_OTHER): Payer: Medicare Other | Admitting: Family Medicine

## 2021-09-30 ENCOUNTER — Telehealth: Payer: Self-pay

## 2021-09-30 VITALS — BP 120/70 | HR 58 | Temp 98.3°F | Wt 154.4 lb

## 2021-09-30 DIAGNOSIS — M25531 Pain in right wrist: Secondary | ICD-10-CM | POA: Diagnosis not present

## 2021-09-30 DIAGNOSIS — Z86010 Personal history of colonic polyps: Secondary | ICD-10-CM

## 2021-09-30 DIAGNOSIS — M25541 Pain in joints of right hand: Secondary | ICD-10-CM

## 2021-09-30 NOTE — Patient Instructions (Signed)
Consider thumb spica wrist splint    Continue with the Mobic  Consider icing 20 minutes few times daily if possible

## 2021-09-30 NOTE — Telephone Encounter (Signed)
Order in epic for virtual colon. [facility] is not doing them at the moment due to a recall on part of their prep for the procedure. They will contact the pt to schedule appt once they start doing them again. Dr. Candis Schatz notified.

## 2021-09-30 NOTE — Progress Notes (Signed)
Established Patient Office Visit  Subjective:  Patient ID: Theresa Bradley, female    DOB: 1951-01-04  Age: 70 y.o. MRN: [id number]  CC:  Chief Complaint  Patient presents with   Wrist Pain    R wrist, worse on right side below the thumb, x 3 weeks     HPI Theresa Bradley presents for about 3-week history of somewhat poorly localized right wrist and right thumb pain.  Her thumb pain is at the [facility] joint.  Denies any injury.  Right-hand-dominant.  Does a lot of computer work.  No recent falls.  No visible swelling.  No warmth.  No erythema.  Takes meloxicam at baseline daily.  Denies any numbness.  No night pain.  Past Medical History:  Diagnosis Date   Anxiety    Breast cancer ([place]) 2009   right   Cancer ([place])    COUGH, CHRONIC 05/10/2010   Dysrhythmia    occationally "skips a beat"   Family history of brain cancer    Family history of breast cancer    Family history of lung cancer    Family history of multiple myeloma    Family history of skin cancer    GERD (gastroesophageal reflux disease)    HYPERTENSION 04/16/2009   HYPOTHYROIDISM 04/16/2009   OSTEOARTHRITIS 04/16/2009   Personal history of radiation therapy    SENILE LENTIGO 06/10/2010    Past Surgical History:  Procedure Laterality Date   BREAST EXCISIONAL BIOPSY Right    BREAST EXCISIONAL BIOPSY Left    BREAST LUMPECTOMY Right 2009   radiation only   BREAST LUMPECTOMY WITH RADIOACTIVE SEED AND SENTINEL LYMPH NODE BIOPSY Right 01/31/2020   Procedure: RIGHT BREAST LUMPECTOMY WITH RADIOACTIVE SEED AND SENTINEL LYMPH NODE BIOPSY;  Surgeon: [name], MD;  Location: [place];  Service: General;  Laterality: Right;  Reader  2009   X 2, cancer stage 0, lumpectomy   CESAREAN SECTION     1979, [place]  11/2015   HERNIA REPAIR  2009   JOINT REPLACEMENT  2008   hip   MASTECTOMY Right    OPEN  SURGICAL REPAIR OF GLUTEAL TENDON Left 07/18/2019   Procedure: Left hip bearing surface revision with gluteal tendon repair;  Surgeon: Gaynelle Arabian, MD;  Location: [facility];  Service: Orthopedics;  Laterality: Left;  2 hrs   PORTACATH PLACEMENT N/A 03/15/2020   Procedure: INSERTION PORT-A-CATH WITH ULTRASOUND GUIDANCE;  Surgeon: [name], MD;  Location: [place];  Service: General;  Laterality: N/A;   SIMPLE MASTECTOMY WITH AXILLARY SENTINEL NODE BIOPSY Right 03/15/2020   Procedure: RIGHT SIMPLE MASTECTOMY;  Surgeon: [name], MD;  Location: [place];  Service: General;  Laterality: Right;    Family History  Problem Relation Age of Onset   Skin cancer Mother    Hyperlipidemia Father    Dementia Father    Skin cancer Father    Heart attack Father    Diabetes Brother    Hypertension Brother    Breast cancer Maternal Aunt 14   Breast cancer Maternal Aunt        dx. mid-70s   Cancer Paternal Aunt        unknown type, dx. >50   Lung cancer Paternal Uncle    Multiple myeloma Paternal Uncle    Brain cancer Cousin  dx. 8s, paternal cousin   Arthritis Other    Hypertension Other    Cancer Other        2 aunts   Colon cancer Neg Hx    Esophageal cancer Neg Hx    Pancreatic cancer Neg Hx    Stomach cancer Neg Hx     Social History   Socioeconomic History   Marital status: Married    Spouse name: Not on file   Number of children: 2   Years of education: Not on file   Highest education level: Bachelor's degree (e.g., BA, AB, BS)  Occupational History   Occupation: Cabin crew    Comment: Full-time   Occupation: Science writer: [employer]    Comment: Retired  Tobacco Use   Smoking status: Never   Smokeless tobacco: Never   Tobacco comments:    father smoked when she was younger   Media planner   Vaping Use: Never used  Substance and Sexual Activity   Alcohol use: Yes    Comment: occasionally    Drug use: No    Sexual activity: Not on file  Other Topics Concern   Not on file  Social History Narrative   Lives with husband on 2 level house, has two sons, one local with grandkids   Works FT as Cabin crew; Psychiatrist   Enjoys working out at gym about 2X/week, attends silver sneakers classes   Dad struggles with dementia, mother still living who is caretaker; occasionally travels to visit to assist mother.   Social Determinants of Health   Financial Resource Strain: Low Risk    Difficulty of Paying Living Expenses: Not very hard  Food Insecurity: No Food Insecurity   Worried About Charity fundraiser in the Last Year: Never true   Ran Out of Food in the Last Year: Never true  Transportation Needs: No Transportation Needs   Lack of Transportation (Medical): No   Lack of Transportation (Non-Medical): No  Physical Activity: Insufficiently Active   Days of Exercise per Week: 1 day   Minutes of Exercise per Session: 30 min  Stress: No Stress Concern Present   Feeling of Stress : Only a little  Social Connections: Unknown   Frequency of Communication with Friends and Family: More than three times a week   Frequency of Social Gatherings with Friends and Family: Twice a week   Attends Religious Services: Patient refused   Marine scientist or Organizations: Patient refused   Attends Music therapist: 1 to 4 times per year   Marital Status: Married  Human resources officer Violence: Not At Risk   Fear of Current or Ex-Partner: No   Emotionally Abused: No   Physically Abused: No   Sexually Abused: No    Outpatient Medications Prior to Visit  Medication Sig Dispense Refill   ALPRAZolam (XANAX) 0.25 MG tablet TAKE 1/2 A TABLET BY MOUTH AT BEDTIME AS NEEDED FOR INSOMNIA 45 tablet 0   amLODipine (NORVASC) 5 MG tablet TAKE 1 TABLET BY MOUTH EVERY DAY 90 tablet 3   budesonide-formoterol (SYMBICORT) 80-4.5 MCG/ACT inhaler Inhale 1 puff into the lungs daily in the afternoon.      CALCIUM-VITAMIN D PO Take 2 tablets by mouth daily.     citalopram (CELEXA) 20 MG tablet TAKE 1 TABLET BY MOUTH EVERY DAY 90 tablet 3   EPIPEN 2-PAK 0.3 MG/0.3ML SOAJ injection Inject 0.3 mg into the muscle as needed for anaphylaxis.  gabapentin (NEURONTIN) 300 MG capsule Take 1 capsule (300 mg total) by mouth at bedtime. 90 capsule 4   levothyroxine (SYNTHROID) 50 MCG tablet TAKE 1 TABLET BY MOUTH EVERY DAY 90 tablet 0   meloxicam (MOBIC) 15 MG tablet TAKE 1 TABLET BY MOUTH EVERY DAY 90 tablet 0   Methylcellulose, Laxative, (CITRUCEL PO) Take 2 tablets by mouth at bedtime.     polyethylene glycol (MIRALAX / GLYCOLAX) 17 g packet Take 17 g by mouth every other day.     tamoxifen (NOLVADEX) 20 MG tablet Take 1 tablet (20 mg total) by mouth daily. 90 tablet 4   tretinoin (RETIN-A) 0.025 % cream Apply topically at bedtime. 45 g 6   No facility-administered medications prior to visit.    Allergies  Allergen Reactions   Codeine Sulfate Nausea And Vomiting   Erythromycin Base Other (See Comments)    Stomach ache   Esomeprazole Magnesium     REACTION: GI upset    ROS Review of Systems  Constitutional:  Negative for chills and fever.  Neurological:  Negative for weakness and numbness.     Objective:    Physical Exam Vitals reviewed.  Constitutional:      Appearance: Normal appearance.  Cardiovascular:     Rate and Rhythm: Normal rate and regular rhythm.  Musculoskeletal:     Comments: Right hand and wrist examined.  Does not have any tenderness involving the right thumb abductor or extensor tendons.  Tinel's sign negative.  Mild tenderness just distal to the distal ulna bone.  No visible erythema.  No warmth.  No wrist joint effusion.  She does have some tenderness over the right CMC joint.  Neurological:     Mental Status: She is alert.    BP 120/70 (BP Location: Left Arm, Patient Position: Sitting, Cuff Size: Normal)   Pulse (!) 58   Temp 98.3 F (36.8 C) (Oral)   Wt 154  lb 6.4 oz (70 kg)   SpO2 98%   BMI 26.50 kg/m  Wt Readings from Last 3 Encounters:  09/30/21 154 lb 6.4 oz (70 kg)  09/27/21 150 lb (68 kg)  09/18/21 154 lb 4.8 oz (70 kg)     Health Maintenance Due  Topic Date Due   TETANUS/TDAP  06/10/2020   COVID-19 Vaccine (4 - Booster for Pfizer series) 07/20/2020    There are no preventive care reminders to display for this patient.  Lab Results  Component Value Date   TSH 3.82 02/22/2021   Lab Results  Component Value Date   WBC 5.5 09/18/2021   HGB 12.9 09/18/2021   HCT 39.6 09/18/2021   MCV 91.7 09/18/2021   PLT 258 09/18/2021   Lab Results  Component Value Date   NA 141 09/18/2021   K 3.9 09/18/2021   CO2 27 09/18/2021   GLUCOSE 84 09/18/2021   BUN 14 09/18/2021   CREATININE 0.86 09/18/2021   BILITOT 0.4 09/18/2021   ALKPHOS 36 (L) 09/18/2021   AST 13 (L) 09/18/2021   ALT 6 09/18/2021   PROT 7.1 09/18/2021   ALBUMIN 3.6 09/18/2021   CALCIUM 8.9 09/18/2021   ANIONGAP 10 09/18/2021   GFR 80.08 06/29/2019   Lab Results  Component Value Date   CHOL 201 (H) 02/22/2021   Lab Results  Component Value Date   HDL 44.30 02/22/2021   Lab Results  Component Value Date   LDLCALC 129 (H) 02/22/2021   Lab Results  Component Value Date   TRIG 142.0 02/22/2021  Lab Results  Component Value Date   CHOLHDL 5 02/22/2021   Lab Results  Component Value Date   HGBA1C 5.7 02/22/2021      Assessment & Plan:   Right wrist and thumb pain.  No recent injury.  Doubt carpal tunnel syndrome.  May have some osteoarthritis right CMC joint.  May have some tendinitis as well.  No evidence for de Quervain's tenosynovitis  -Consider over-the-counter thumb spica wrist splint -Consider icing 15 to 20 minutes 2-3 times daily -Continue meloxicam -Avoid overuse activities much as possible -Consider x-rays if not improving over the next several weeks  No orders of the defined types were placed in this encounter.   Follow-up: No  follow-ups on file.    Carolann Littler, MD

## 2021-10-01 ENCOUNTER — Telehealth: Payer: Self-pay | Admitting: *Deleted

## 2021-10-01 ENCOUNTER — Telehealth: Payer: Self-pay

## 2021-10-01 NOTE — Telephone Encounter (Signed)
Message left

## 2021-10-01 NOTE — Telephone Encounter (Signed)
Attempted to reach pt. With follow-up call following endoscopic procedure 09/27/2021.  LM On pt. Voice mail to call if she has any questions or concerns.

## 2021-10-02 ENCOUNTER — Other Ambulatory Visit: Payer: Self-pay | Admitting: Family Medicine

## 2021-10-02 DIAGNOSIS — J301 Allergic rhinitis due to pollen: Secondary | ICD-10-CM | POA: Diagnosis not present

## 2021-10-02 DIAGNOSIS — J3089 Other allergic rhinitis: Secondary | ICD-10-CM | POA: Diagnosis not present

## 2021-10-02 DIAGNOSIS — J3081 Allergic rhinitis due to animal (cat) (dog) hair and dander: Secondary | ICD-10-CM | POA: Diagnosis not present

## 2021-10-07 NOTE — Progress Notes (Signed)
Theresa Bradley, The polyp which I removed during your recent procedure was proven to be completely benign but is considered a "pre-cancerous" polyp that MAY have grown into cancer if it had not been removed.  Studies shows that at least 20% of women over age 72 and 30% of men over age 34 have pre-cancerous polyps.   If your virtual colonoscopy does not demonstrate any other polyps that require removal, I would recommend repeat colonoscopy or virtual colonoscopy in 5 years.

## 2021-10-09 ENCOUNTER — Other Ambulatory Visit: Payer: Medicare Other

## 2021-10-09 ENCOUNTER — Ambulatory Visit: Payer: Medicare Other | Admitting: Oncology

## 2021-10-10 DIAGNOSIS — J3089 Other allergic rhinitis: Secondary | ICD-10-CM | POA: Diagnosis not present

## 2021-10-10 DIAGNOSIS — J3081 Allergic rhinitis due to animal (cat) (dog) hair and dander: Secondary | ICD-10-CM | POA: Diagnosis not present

## 2021-10-10 DIAGNOSIS — J301 Allergic rhinitis due to pollen: Secondary | ICD-10-CM | POA: Diagnosis not present

## 2021-10-16 DIAGNOSIS — J3089 Other allergic rhinitis: Secondary | ICD-10-CM | POA: Diagnosis not present

## 2021-10-16 DIAGNOSIS — J3081 Allergic rhinitis due to animal (cat) (dog) hair and dander: Secondary | ICD-10-CM | POA: Diagnosis not present

## 2021-10-16 DIAGNOSIS — J301 Allergic rhinitis due to pollen: Secondary | ICD-10-CM | POA: Diagnosis not present

## 2021-10-17 ENCOUNTER — Other Ambulatory Visit: Payer: Self-pay | Admitting: Oncology

## 2021-10-22 DIAGNOSIS — J301 Allergic rhinitis due to pollen: Secondary | ICD-10-CM | POA: Diagnosis not present

## 2021-10-22 DIAGNOSIS — J3081 Allergic rhinitis due to animal (cat) (dog) hair and dander: Secondary | ICD-10-CM | POA: Diagnosis not present

## 2021-10-22 DIAGNOSIS — J3089 Other allergic rhinitis: Secondary | ICD-10-CM | POA: Diagnosis not present

## 2021-10-25 DIAGNOSIS — J301 Allergic rhinitis due to pollen: Secondary | ICD-10-CM | POA: Diagnosis not present

## 2021-10-25 DIAGNOSIS — J3089 Other allergic rhinitis: Secondary | ICD-10-CM | POA: Diagnosis not present

## 2021-10-25 DIAGNOSIS — J3081 Allergic rhinitis due to animal (cat) (dog) hair and dander: Secondary | ICD-10-CM | POA: Diagnosis not present

## 2021-11-06 DIAGNOSIS — J3081 Allergic rhinitis due to animal (cat) (dog) hair and dander: Secondary | ICD-10-CM | POA: Diagnosis not present

## 2021-11-06 DIAGNOSIS — J301 Allergic rhinitis due to pollen: Secondary | ICD-10-CM | POA: Diagnosis not present

## 2021-11-06 DIAGNOSIS — J3089 Other allergic rhinitis: Secondary | ICD-10-CM | POA: Diagnosis not present

## 2021-11-20 DIAGNOSIS — J3081 Allergic rhinitis due to animal (cat) (dog) hair and dander: Secondary | ICD-10-CM | POA: Diagnosis not present

## 2021-11-20 DIAGNOSIS — J301 Allergic rhinitis due to pollen: Secondary | ICD-10-CM | POA: Diagnosis not present

## 2021-11-20 DIAGNOSIS — J3089 Other allergic rhinitis: Secondary | ICD-10-CM | POA: Diagnosis not present

## 2021-11-22 ENCOUNTER — Other Ambulatory Visit: Payer: Self-pay | Admitting: Oncology

## 2021-11-22 ENCOUNTER — Other Ambulatory Visit: Payer: Self-pay | Admitting: Family Medicine

## 2021-12-04 DIAGNOSIS — J3089 Other allergic rhinitis: Secondary | ICD-10-CM | POA: Diagnosis not present

## 2021-12-04 DIAGNOSIS — J301 Allergic rhinitis due to pollen: Secondary | ICD-10-CM | POA: Diagnosis not present

## 2021-12-04 DIAGNOSIS — J3081 Allergic rhinitis due to animal (cat) (dog) hair and dander: Secondary | ICD-10-CM | POA: Diagnosis not present

## 2021-12-19 DIAGNOSIS — J3081 Allergic rhinitis due to animal (cat) (dog) hair and dander: Secondary | ICD-10-CM | POA: Diagnosis not present

## 2021-12-19 DIAGNOSIS — J301 Allergic rhinitis due to pollen: Secondary | ICD-10-CM | POA: Diagnosis not present

## 2021-12-19 DIAGNOSIS — J3089 Other allergic rhinitis: Secondary | ICD-10-CM | POA: Diagnosis not present

## 2021-12-27 DIAGNOSIS — H40013 Open angle with borderline findings, low risk, bilateral: Secondary | ICD-10-CM | POA: Diagnosis not present

## 2021-12-27 DIAGNOSIS — H5203 Hypermetropia, bilateral: Secondary | ICD-10-CM | POA: Diagnosis not present

## 2021-12-27 DIAGNOSIS — H2513 Age-related nuclear cataract, bilateral: Secondary | ICD-10-CM | POA: Diagnosis not present

## 2022-01-01 DIAGNOSIS — J3081 Allergic rhinitis due to animal (cat) (dog) hair and dander: Secondary | ICD-10-CM | POA: Diagnosis not present

## 2022-01-01 DIAGNOSIS — J301 Allergic rhinitis due to pollen: Secondary | ICD-10-CM | POA: Diagnosis not present

## 2022-01-01 DIAGNOSIS — J3089 Other allergic rhinitis: Secondary | ICD-10-CM | POA: Diagnosis not present

## 2022-01-13 DIAGNOSIS — L821 Other seborrheic keratosis: Secondary | ICD-10-CM | POA: Diagnosis not present

## 2022-01-13 DIAGNOSIS — Z85820 Personal history of malignant melanoma of skin: Secondary | ICD-10-CM | POA: Diagnosis not present

## 2022-01-13 DIAGNOSIS — B351 Tinea unguium: Secondary | ICD-10-CM | POA: Diagnosis not present

## 2022-01-13 DIAGNOSIS — D225 Melanocytic nevi of trunk: Secondary | ICD-10-CM | POA: Diagnosis not present

## 2022-01-13 DIAGNOSIS — D1801 Hemangioma of skin and subcutaneous tissue: Secondary | ICD-10-CM | POA: Diagnosis not present

## 2022-01-13 DIAGNOSIS — L814 Other melanin hyperpigmentation: Secondary | ICD-10-CM | POA: Diagnosis not present

## 2022-01-13 DIAGNOSIS — L308 Other specified dermatitis: Secondary | ICD-10-CM | POA: Diagnosis not present

## 2022-01-14 ENCOUNTER — Ambulatory Visit (INDEPENDENT_AMBULATORY_CARE_PROVIDER_SITE_OTHER): Payer: Medicare Other

## 2022-01-14 VITALS — Ht 64.0 in | Wt 154.0 lb

## 2022-01-14 DIAGNOSIS — Z0000 Encounter for general adult medical examination without abnormal findings: Secondary | ICD-10-CM

## 2022-01-14 NOTE — Progress Notes (Signed)
Subjective:   Theresa Bradley is a 71 y.o. female who presents for Medicare Annual (Subsequent) preventive examination.  Review of Systems    Virtual Visit via Telephone Note  I connected with  Theresa Bradley on 01/14/22 at  2:30 PM EST by telephone and verified that I am speaking with the correct person using two identifiers.  Location: Patient: Home Provider: Office Persons participating in the virtual visit: patient/Nurse Health Advisor   I discussed the limitations, risks, security and privacy concerns of performing an evaluation and management service by telephone and the availability of in person appointments. The patient expressed understanding and agreed to proceed.  Interactive audio and video telecommunications were attempted between this nurse and patient, however failed, due to patient having technical difficulties OR patient did not have access to video capability.  We continued and completed visit with audio only.  Some vital signs may be absent or patient reported.   Theresa Peaches, LPN  Cardiac Risk Factors include: advanced age (>28mn, >>60women);hypertension     Objective:    Today's Vitals   01/14/22 1433 01/14/22 1434  Weight: 154 lb (69.9 kg)   Height: 5' 4" (1.626 m)   PainSc:  2    Body mass index is 26.43 kg/m.  Advanced Directives 01/14/2022 06/13/2021 03/15/2021 03/12/2021 02/18/2021 01/29/2021 01/08/2021  Does Patient Have a Medical Advance Directive? _0  Yes Yes  Type of AParamedicof AMarshalltonLiving will H[place]moreLiving will HMcConnelsvilleLiving will HGarfieldLiving will HGranadaLiving will HCedar BluffLiving will HRising CityLiving will  Does patient want to make changes to medical advance directive? No - Patient declined - No - Patient declined No - Patient declined - No - Patient declined No - Patient  declined  Copy of [place]in Chart? No - copy requested Yes - validated most recent copy scanned in chart (See row information) Yes - validated most recent copy scanned in chart (See row information) No - copy requested No - copy requested - No - copy requested  Would patient like information on creating a medical advance directive? - - - - - - -    Current Medications (verified) Outpatient Encounter Medications as of 01/14/2022  Medication Sig   ALPRAZolam (XANAX) 0.25 MG tablet TAKE 1/2 A TABLET BY MOUTH AT BEDTIME AS NEEDED FOR INSOMNIA   amLODipine (NORVASC) 5 MG tablet TAKE 1 TABLET BY MOUTH EVERY DAY   budesonide-formoterol (SYMBICORT) 80-4.5 MCG/ACT inhaler Inhale 1 puff into the lungs daily in the afternoon.   CALCIUM-VITAMIN D PO Take 2 tablets by mouth daily.   citalopram (CELEXA) 20 MG tablet TAKE 1 TABLET BY MOUTH EVERY DAY   EPIPEN 2-PAK 0.3 MG/0.3ML SOAJ injection Inject 0.3 mg into the muscle as needed for anaphylaxis.    gabapentin (NEURONTIN) 300 MG capsule TAKE 1 CAPSULE BY MOUTH EVERYDAY AT BEDTIME   levothyroxine (SYNTHROID) 50 MCG tablet TAKE 1 TABLET BY MOUTH EVERY DAY   meloxicam (MOBIC) 15 MG tablet TAKE 1 TABLET BY MOUTH EVERY DAY   polyethylene glycol (MIRALAX / GLYCOLAX) 17 g packet Take 17 g by mouth every other day.   tamoxifen (NOLVADEX) 20 MG tablet TAKE 1 TABLET BY MOUTH EVERY DAY, START 07/31/20   Methylcellulose, Laxative, (CITRUCEL PO) Take 2 tablets by mouth at bedtime.   tretinoin (RETIN-A) 0.025 % cream Apply topically at bedtime.   [  DISCONTINUED] prochlorperazine (COMPAZINE) 10 MG tablet Take 1 tablet (10 mg total) by mouth every 6 (six) hours as needed (Nausea or vomiting).   No facility-administered encounter medications on file as of 01/14/2022.    Allergies (verified) Codeine sulfate, Erythromycin base, and Esomeprazole magnesium   History: Past Medical History:  Diagnosis Date   Anxiety    Breast cancer ([place]) 2009   right    Cancer ([place])    COUGH, CHRONIC 05/10/2010   Dysrhythmia    occationally "skips a beat"   Family history of brain cancer    Family history of breast cancer    Family history of lung cancer    Family history of multiple myeloma    Family history of skin cancer    GERD (gastroesophageal reflux disease)    HYPERTENSION 04/16/2009   HYPOTHYROIDISM 04/16/2009   OSTEOARTHRITIS 04/16/2009   Personal history of radiation therapy    SENILE LENTIGO 06/10/2010   Past Surgical History:  Procedure Laterality Date   BREAST EXCISIONAL BIOPSY Right    BREAST EXCISIONAL BIOPSY Left    BREAST LUMPECTOMY Right 2009   radiation only   BREAST LUMPECTOMY WITH RADIOACTIVE SEED AND SENTINEL LYMPH NODE BIOPSY Right 01/31/2020   Procedure: RIGHT BREAST LUMPECTOMY WITH RADIOACTIVE SEED AND SENTINEL LYMPH NODE BIOPSY;  Surgeon: [name], MD;  Location: [facility];  Service: General;  Laterality: Right;  [place]  2009   X 2, cancer stage 0, lumpectomy   CESAREAN SECTION     1979, [place]  11/2015   HERNIA REPAIR  2009   JOINT REPLACEMENT  2008   hip   MASTECTOMY Right    OPEN SURGICAL REPAIR OF GLUTEAL TENDON Left 07/18/2019   Procedure: Left hip bearing surface revision with gluteal tendon repair;  Surgeon: Gaynelle Arabian, MD;  Location: [facility];  Service: Orthopedics;  Laterality: Left;  2 hrs   PORTACATH PLACEMENT N/A 03/15/2020   Procedure: INSERTION PORT-A-CATH WITH ULTRASOUND GUIDANCE;  Surgeon: [name], MD;  Location: [facility];  Service: General;  Laterality: N/A;   SIMPLE MASTECTOMY WITH AXILLARY SENTINEL NODE BIOPSY Right 03/15/2020   Procedure: RIGHT SIMPLE MASTECTOMY;  Surgeon: [name], MD;  Location: [facility];  Service: General;  Laterality: Right;   Family History  Problem Relation Age of Onset   Skin cancer Mother     Hyperlipidemia Father    Dementia Father    Skin cancer Father    Heart attack Father    Diabetes Brother    Hypertension Brother    Breast cancer Maternal Aunt 60   Breast cancer Maternal Aunt        dx. mid-70s   Cancer Paternal Aunt        unknown type, dx. >50   Lung cancer Paternal Uncle    Multiple myeloma Paternal Uncle    Brain cancer Cousin        dx. 47s, paternal cousin   Arthritis Other    Hypertension Other    Cancer Other        2 aunts   Colon cancer Neg Hx    Esophageal cancer Neg Hx    Pancreatic cancer Neg Hx    Stomach cancer Neg Hx    Social History   Socioeconomic History   Marital status: Married    Spouse name: Not on file  Number of children: 2   Years of education: Not on file   Highest education level: Bachelor's degree (e.g., BA, AB, BS)  Occupational History   Occupation: Cabin crew    Comment: Full-time   Occupation: Science writer: [employer]    Comment: Retired  Tobacco Use   Smoking status: Never   Smokeless tobacco: Never   Tobacco comments:    father smoked when she was younger   Media planner   Vaping Use: Never used  Substance and Sexual Activity   Alcohol use: Yes    Comment: occasionally    Drug use: No   Sexual activity: Not on file  Other Topics Concern   Not on file  Social History Narrative   Lives with husband on 2 level house, has two sons, one local with grandkids   Works FT as Cabin crew; Psychiatrist   Enjoys working out at gym about 2X/week, attends silver sneakers classes   Dad struggles with dementia, mother still living who is caretaker; occasionally travels to visit to assist mother.   Social Determinants of Health   Financial Resource Strain: Low Risk    Difficulty of Paying Living Expenses: Not hard at all  Food Insecurity: No Food Insecurity   Worried About Charity fundraiser in the Last Year: Never true   [place]per in the Last Year: Never true  Transportation Needs: No Transportation  Needs   Lack of Transportation (Medical): No   Lack of Transportation (Non-Medical): No  Physical Activity: Insufficiently Active   Days of Exercise per Week: 2 days   Minutes of Exercise per Session: 20 min  Stress: No Stress Concern Present   Feeling of Stress : Only a little  Social Connections: Moderately Integrated   Frequency of Communication with Friends and Family: More than three times a week   Frequency of Social Gatherings with Friends and Family: Once a week   Attends Religious Services: Never   Marine scientist or Organizations: Yes   Attends Archivist Meetings: Never   Marital Status: Married    Tobacco Counseling Counseling given: Not Answered Tobacco comments: father smoked when she was younger    Clinical Intake:  Pre-visit preparation completed: Yes  Pain : 0-10 Pain Score: 2  Pain Type: Chronic pain Pain Location: Shoulder Pain Orientation: Other (Comment) (Wrist pain) Pain Descriptors / Indicators: Constant Pain Onset: More than a month ago (Followed by PCP) Pain Frequency: Intermittent Pain Relieving Factors: Rx meds Effect of Pain on Daily Activities: None  Pain Relieving Factors: Rx meds  BMI - recorded: 26.5 Nutritional Status: BMI 25 -29 Overweight Nutritional Risks: None Diabetes: No  How often do you need to have someone help you when you read instructions, pamphlets, or other written materials from your doctor or pharmacy?: 1 - Never  Diabetic?  No   Activities of Daily Living In your present state of health, do you have any difficulty performing the following activities: 01/14/2022 01/14/2022  Hearing? N N  Vision? N N  Difficulty concentrating or making decisions? N N  Walking or climbing stairs? N N  Dressing or bathing? N N  Doing errands, shopping? N N  Preparing Food and eating ? N N  Using the Toilet? N N  In the past six months, have you accidently leaked urine? Y Y  Comment Wears pads -  Do you have  problems with loss of bowel control? N N  Managing your  Medications? N N  Managing your Finances? N N  Housekeeping or managing your Housekeeping? N N  Some recent data might be hidden    Patient Care Team: [name], MD as PCP - General [name], [name], MD as Consulting Physician (Allergy and Immunology) [name], OD as Referring Physician [name], MD as Consulting Physician (General Surgery) [name], [name], MD (Inactive) as Consulting Physician (Oncology) [name], MD as Attending Physician (Radiation Oncology) [name], MD as Referring Physician (Dermatology) [name], MD as Consulting Physician (Cardiology)  Indicate any recent Medical Services you may have received from other than [facility] providers in the past year (date may be approximate).     Assessment:   This is a routine wellness examination for [place].  Hearing/Vision screen Hearing Screening - Comments:: No hearing difficulty Vision Screening - Comments:: Wears glasses. Followed by Dr Prudencio Burly  Dietary issues and exercise activities discussed: Exercise limited by: None identified   Goals Addressed               This Visit's Progress     Exercise 3x per week (30 min per time) (pt-stated)        ExerciseThree times a week        Depression Screen PHQ 2/9 Scores 01/14/2022 02/18/2021 02/18/2021 12/31/2018 12/02/2016 11/06/2015 09/29/2014  PHQ - 2 Score 0 0 0 0 0 0 0  PHQ- 9 Score - - - 1 - - -    Fall Risk Fall Risk  01/14/2022 01/14/2022 09/26/2021 04/12/2021 02/18/2021  Falls in the past year? 0 0 0 0 -  Comment - - - - -  Number falls in past yr: 0 - - - -  Injury with Fall? 0 - - - -  Risk for fall due to : No Fall Risks - - - -  Follow up - - - - Falls evaluation completed    FALL RISK PREVENTION PERTAINING TO THE HOME:  Any stairs in or around the home? Yes  If so, are there any without handrails? No  Home free of loose throw rugs in walkways, pet beds,  electrical cords, etc? Yes  Adequate lighting in your home to reduce risk of falls? Yes   ASSISTIVE DEVICES UTILIZED TO PREVENT FALLS:  Life alert? No  Use of a cane, walker or w/c? No  Grab bars in the bathroom? Yes  Shower chair or bench in shower? No  Elevated toilet seat or a handicapped toilet? Yes  TIMED UP AND GO:  Was the test performed? No . Audio Visit  Cognitive Function: MMSE - Mini Mental State Exam 12/29/2017  Not completed: (No Data)   6CIT Screen 01/14/2022  What Year? 0 points  What month? 0 points  What time? 0 points  Count back from 20 0 points  Months in reverse 0 points  Repeat phrase 0 points  Total Score 0    Immunizations Immunization History  Administered Date(s) Administered   Fluad Quad(high Dose 65+) 08/15/2019, 09/20/2020   Influenza Split 09/08/2011, 08/20/2012, 09/26/2013   Influenza Whole 09/11/2010   Influenza, High Dose Seasonal PF 10/03/2015, 08/14/2016, 09/01/2017, 09/06/2018   Influenza,inj,Quad PF,6+ Mos 09/01/2014   Influenza,inj,quad, With Preservative 07/27/2017, 08/24/2018   Influenza-Unspecified 09/02/2021   PFIZER(Purple Top)SARS-COV-2 Vaccination 12/18/2019, 01/09/2020   Pneumococcal Conjugate-13 09/29/2014   Pneumococcal Polysaccharide-23 11/24/2006, 11/06/2015   Td 11/25/1999, 06/10/2010   Zoster Recombinat (Shingrix) 06/05/2017, 10/24/2017   Zoster, Live 07/09/2010    T--Dap:Patient deferred  Flu  Vaccine status: Up to date  Pneumococcal vaccine status: Up to date  Covid-19 vaccine status: Completed vaccines  Qualifies for Shingles Vaccine? Yes   Zostavax completed Yes   Shingrix Completed?: Yes  Screening Tests Health Maintenance  Topic Date Due   COVID-19 Vaccine (4 - Booster for Pfizer series) 01/30/2022 (Originally 07/20/2020)   TETANUS/TDAP  01/14/2023 (Originally 06/10/2020)   MAMMOGRAM  03/30/2023   COLONOSCOPY (Pts 45-17yr Insurance coverage will need to be confirmed)  09/27/2026   Pneumonia Vaccine  71 Years old  Completed   INFLUENZA VACCINE  Completed   DEXA SCAN  Completed   Zoster Vaccines- Shingrix  Completed   HPV VACCINES  Aged Out   Hepatitis C Screening  Discontinued   Fecal DNA (Cologuard)  Discontinued    Health Maintenance  There are no preventive care reminders to display for this patient.   Colorectal cancer screening: Type of screening: Cologuard. Completed 01/20/19. Repeat every 3 years  Mammogram status: Completed 03/29/21. Repeat every year  Bone Density status: Completed 02/26/21. Results reflect: Bone density results: OSTEOPOROSIS. Repeat every 2 years.  Lung Cancer Screening: (Low Dose CT Chest recommended if Age 73-80years, 30 pack-year currently smoking OR have quit w/in 15years.) does not qualify.    Additional Screening:  Vision Screening: Recommended annual ophthalmology exams for early detection of glaucoma and other disorders of the eye. Is the patient up to date with their annual eye exam?  Yes  Who is the provider or what is the name of the office in which the patient attends annual eye exams? Dr LPrudencio BurlyIf pt is not established with a provider, would they like to be referred to a provider to establish care? No .   Dental Screening: Recommended annual dental exams for proper oral hygiene  Community Resource Referral / Chronic Care Management:  CRR required this visit?  No   CCM required this visit?  No      Plan:     I have personally reviewed and noted the following in the patients chart:   Medical and social history Use of alcohol, tobacco or illicit drugs  Current medications and supplements including opioid prescriptions.  Functional ability and status Nutritional status Physical activity Advanced directives List of other physicians Hospitalizations, surgeries, and ER visits in previous 12 months Vitals Screenings to include cognitive, depression, and falls Referrals and appointments  In addition, I have reviewed and  discussed with patient certain preventive protocols, quality metrics, and best practice recommendations. A written personalized care plan for preventive services as well as general preventive health recommendations were provided to patient.     BCriselda Peaches LPN   [id number]  Nurse Notes: None

## 2022-01-14 NOTE — Patient Instructions (Addendum)
Theresa Bradley , Thank you for taking time to come for your Medicare Wellness Visit. I appreciate your ongoing commitment to your health goals. Please review the following plan we discussed and let me know if I can assist you in the future.   These are the goals we discussed:  Goals       Exercise 3x per week (30 min per time) (pt-stated)      ExerciseThree times a week       Patient Stated      Lose weight to get to 155lb to reduce joint pain, get rid of belly fat      Weight (lb) < 150 lb (68 kg)      Portion control and eating less sweets Focus on good nutrition  Leans and green  Watch sodium           This is a list of the screening recommended for you and due dates:  Health Maintenance  Topic Date Due   COVID-19 Vaccine (4 - Booster for Pfizer series) 01/30/2022*   Tetanus Vaccine  01/14/2023*   Mammogram  03/30/2023   Colon Cancer Screening  09/27/2026   Pneumonia Vaccine  Completed   Flu Shot  Completed   DEXA scan (bone density measurement)  Completed   Zoster (Shingles) Vaccine  Completed   HPV Vaccine  Aged Out   Hepatitis C Screening: USPSTF Recommendation to screen - Ages 37-79 yo.  Discontinued   Cologuard (Stool DNA test)  Discontinued  *Topic was postponed. The date shown is not the original due date.    Advanced directives: Yes Patient will bring copy  Conditions/risks identified: None  Next appointment: Follow up in one year for your annual wellness visit    Preventive Care 65 Years and Older, Female Preventive care refers to lifestyle choices and visits with your health care provider that can promote health and wellness. What does preventive care include? A yearly physical exam. This is also called an annual well check. Dental exams once or twice a year. Routine eye exams. Ask your health care provider how often you should have your eyes checked. Personal lifestyle choices, including: Daily care of your teeth and gums. Regular physical  activity. Eating a healthy diet. Avoiding tobacco and drug use. Limiting alcohol use. Practicing safe sex. Taking low-dose aspirin every day. Taking vitamin and mineral supplements as recommended by your health care provider. What happens during an annual well check? The services and screenings done by your health care provider during your annual well check will depend on your age, overall health, lifestyle risk factors, and family history of disease. Counseling  Your health care provider may ask you questions about your: Alcohol use. Tobacco use. Drug use. Emotional well-being. Home and relationship well-being. Sexual activity. Eating habits. History of falls. Memory and ability to understand (cognition). Work and work Statistician. Reproductive health. Screening  You may have the following tests or measurements: Height, weight, and BMI. Blood pressure. Lipid and cholesterol levels. These may be checked every 5 years, or more frequently if you are over 25 years old. Skin check. Lung cancer screening. You may have this screening every year starting at age 74 if you have a 30-pack-year history of smoking and currently smoke or have quit within the past 15 years. Fecal occult blood test (FOBT) of the stool. You may have this test every year starting at age 61. Flexible sigmoidoscopy or colonoscopy. You may have a sigmoidoscopy every 5 years or a colonoscopy every 10  years starting at age 73. Hepatitis C blood test. Hepatitis B blood test. Sexually transmitted disease (STD) testing. Diabetes screening. This is done by checking your blood sugar (glucose) after you have not eaten for a while (fasting). You may have this done every 1-3 years. Bone density scan. This is done to screen for osteoporosis. You may have this done starting at age 40. Mammogram. This may be done every 1-2 years. Talk to your health care provider about how often you should have regular mammograms. Talk with your  health care provider about your test results, treatment options, and if necessary, the need for more tests. Vaccines  Your health care provider may recommend certain vaccines, such as: Influenza vaccine. This is recommended every year. Tetanus, diphtheria, and acellular pertussis (Tdap, Td) vaccine. You may need a Td booster every 10 years. Zoster vaccine. You may need this after age 53. Pneumococcal 13-valent conjugate (PCV13) vaccine. One dose is recommended after age 24. Pneumococcal polysaccharide (PPSV23) vaccine. One dose is recommended after age 26. Talk to your health care provider about which screenings and vaccines you need and how often you need them. This information is not intended to replace advice given to you by your health care provider. Make sure you discuss any questions you have with your health care provider. Document Released: 12/07/2015 Document Revised: 07/30/2016 Document Reviewed: 09/11/2015 Elsevier Interactive Patient Education  2017 [place] Prevention in the Home Falls can cause injuries. They can happen to people of all ages. There are many things you can do to make your home safe and to help prevent falls. What can I do on the outside of my home? Regularly fix the edges of walkways and driveways and fix any cracks. Remove anything that might make you trip as you walk through a door, such as a raised step or threshold. Trim any bushes or trees on the path to your home. Use bright outdoor lighting. Clear any walking paths of anything that might make someone trip, such as rocks or tools. Regularly check to see if handrails are loose or broken. Make sure that both sides of any steps have handrails. Any raised decks and porches should have guardrails on the edges. Have any leaves, snow, or ice cleared regularly. Use sand or salt on walking paths during winter. Clean up any spills in your garage right away. This includes oil or grease spills. What can I  do in the bathroom? Use night lights. Install grab bars by the toilet and in the tub and shower. Do not use towel bars as grab bars. Use non-skid mats or decals in the tub or shower. If you need to sit down in the shower, use a plastic, non-slip stool. Keep the floor dry. Clean up any water that spills on the floor as soon as it happens. Remove soap buildup in the tub or shower regularly. Attach bath mats securely with double-sided non-slip rug tape. Do not have throw rugs and other things on the floor that can make you trip. What can I do in the bedroom? Use night lights. Make sure that you have a light by your bed that is easy to reach. Do not use any sheets or blankets that are too big for your bed. They should not hang down onto the floor. Have a firm chair that has side arms. You can use this for support while you get dressed. Do not have throw rugs and other things on the floor that can make you trip.  What can I do in the kitchen? Clean up any spills right away. Avoid walking on wet floors. Keep items that you use a lot in easy-to-reach places. If you need to reach something above you, use a strong step stool that has a grab bar. Keep electrical cords out of the way. Do not use floor polish or wax that makes floors slippery. If you must use wax, use non-skid floor wax. Do not have throw rugs and other things on the floor that can make you trip. What can I do with my stairs? Do not leave any items on the stairs. Make sure that there are handrails on both sides of the stairs and use them. Fix handrails that are broken or loose. Make sure that handrails are as long as the stairways. Check any carpeting to make sure that it is firmly attached to the stairs. Fix any carpet that is loose or worn. Avoid having throw rugs at the top or bottom of the stairs. If you do have throw rugs, attach them to the floor with carpet tape. Make sure that you have a light switch at the top of the stairs  and the bottom of the stairs. If you do not have them, ask someone to add them for you. What else can I do to help prevent falls? Wear shoes that: Do not have high heels. Have rubber bottoms. Are comfortable and fit you well. Are closed at the toe. Do not wear sandals. If you use a stepladder: Make sure that it is fully opened. Do not climb a closed stepladder. Make sure that both sides of the stepladder are locked into place. Ask someone to hold it for you, if possible. Clearly mark and make sure that you can see: Any grab bars or handrails. First and last steps. Where the edge of each step is. Use tools that help you move around (mobility aids) if they are needed. These include: Canes. Walkers. Scooters. Crutches. Turn on the lights when you go into a dark area. Replace any light bulbs as soon as they burn out. Set up your furniture so you have a clear path. Avoid moving your furniture around. If any of your floors are uneven, fix them. If there are any pets around you, be aware of where they are. Review your medicines with your doctor. Some medicines can make you feel dizzy. This can increase your chance of falling. Ask your doctor what other things that you can do to help prevent falls. This information is not intended to replace advice given to you by your health care provider. Make sure you discuss any questions you have with your health care provider. Document Released: 09/06/2009 Document Revised: 04/17/2016 Document Reviewed: 12/15/2014 Elsevier Interactive Patient Education  2017 [employer].

## 2022-01-15 DIAGNOSIS — J3081 Allergic rhinitis due to animal (cat) (dog) hair and dander: Secondary | ICD-10-CM | POA: Diagnosis not present

## 2022-01-15 DIAGNOSIS — J3089 Other allergic rhinitis: Secondary | ICD-10-CM | POA: Diagnosis not present

## 2022-01-15 DIAGNOSIS — J301 Allergic rhinitis due to pollen: Secondary | ICD-10-CM | POA: Diagnosis not present

## 2022-01-17 ENCOUNTER — Other Ambulatory Visit: Payer: Self-pay | Admitting: Family Medicine

## 2022-01-17 DIAGNOSIS — J301 Allergic rhinitis due to pollen: Secondary | ICD-10-CM | POA: Diagnosis not present

## 2022-01-17 DIAGNOSIS — R059 Cough, unspecified: Secondary | ICD-10-CM | POA: Diagnosis not present

## 2022-01-17 DIAGNOSIS — K2100 Gastro-esophageal reflux disease with esophagitis, without bleeding: Secondary | ICD-10-CM | POA: Diagnosis not present

## 2022-01-17 DIAGNOSIS — J3089 Other allergic rhinitis: Secondary | ICD-10-CM | POA: Diagnosis not present

## 2022-01-17 DIAGNOSIS — J3081 Allergic rhinitis due to animal (cat) (dog) hair and dander: Secondary | ICD-10-CM | POA: Diagnosis not present

## 2022-01-29 DIAGNOSIS — J301 Allergic rhinitis due to pollen: Secondary | ICD-10-CM | POA: Diagnosis not present

## 2022-01-29 DIAGNOSIS — J3089 Other allergic rhinitis: Secondary | ICD-10-CM | POA: Diagnosis not present

## 2022-01-29 DIAGNOSIS — J3081 Allergic rhinitis due to animal (cat) (dog) hair and dander: Secondary | ICD-10-CM | POA: Diagnosis not present

## 2022-01-30 ENCOUNTER — Telehealth: Payer: Self-pay | Admitting: Hematology and Oncology

## 2022-01-30 NOTE — Telephone Encounter (Signed)
Rescheduled appointment per providers. Patient aware.  ? ?

## 2022-02-12 DIAGNOSIS — J3081 Allergic rhinitis due to animal (cat) (dog) hair and dander: Secondary | ICD-10-CM | POA: Diagnosis not present

## 2022-02-12 DIAGNOSIS — J3089 Other allergic rhinitis: Secondary | ICD-10-CM | POA: Diagnosis not present

## 2022-02-12 DIAGNOSIS — J301 Allergic rhinitis due to pollen: Secondary | ICD-10-CM | POA: Diagnosis not present

## 2022-02-20 ENCOUNTER — Other Ambulatory Visit: Payer: Self-pay | Admitting: Family Medicine

## 2022-02-20 DIAGNOSIS — M25531 Pain in right wrist: Secondary | ICD-10-CM | POA: Diagnosis not present

## 2022-02-20 DIAGNOSIS — M25532 Pain in left wrist: Secondary | ICD-10-CM | POA: Diagnosis not present

## 2022-02-20 DIAGNOSIS — M13831 Other specified arthritis, right wrist: Secondary | ICD-10-CM | POA: Diagnosis not present

## 2022-02-20 DIAGNOSIS — R52 Pain, unspecified: Secondary | ICD-10-CM | POA: Diagnosis not present

## 2022-02-21 ENCOUNTER — Other Ambulatory Visit: Payer: Self-pay | Admitting: Family Medicine

## 2022-02-26 DIAGNOSIS — J3081 Allergic rhinitis due to animal (cat) (dog) hair and dander: Secondary | ICD-10-CM | POA: Diagnosis not present

## 2022-02-26 DIAGNOSIS — J3089 Other allergic rhinitis: Secondary | ICD-10-CM | POA: Diagnosis not present

## 2022-02-26 DIAGNOSIS — J301 Allergic rhinitis due to pollen: Secondary | ICD-10-CM | POA: Diagnosis not present

## 2022-03-12 DIAGNOSIS — J3089 Other allergic rhinitis: Secondary | ICD-10-CM | POA: Diagnosis not present

## 2022-03-12 DIAGNOSIS — J301 Allergic rhinitis due to pollen: Secondary | ICD-10-CM | POA: Diagnosis not present

## 2022-03-12 DIAGNOSIS — J3081 Allergic rhinitis due to animal (cat) (dog) hair and dander: Secondary | ICD-10-CM | POA: Diagnosis not present

## 2022-03-18 DIAGNOSIS — H2512 Age-related nuclear cataract, left eye: Secondary | ICD-10-CM | POA: Diagnosis not present

## 2022-03-20 ENCOUNTER — Other Ambulatory Visit: Payer: Self-pay | Admitting: Hematology and Oncology

## 2022-03-20 DIAGNOSIS — Z1231 Encounter for screening mammogram for malignant neoplasm of breast: Secondary | ICD-10-CM

## 2022-03-25 DIAGNOSIS — B351 Tinea unguium: Secondary | ICD-10-CM | POA: Diagnosis not present

## 2022-03-25 DIAGNOSIS — Z79899 Other long term (current) drug therapy: Secondary | ICD-10-CM | POA: Diagnosis not present

## 2022-03-26 DIAGNOSIS — J3081 Allergic rhinitis due to animal (cat) (dog) hair and dander: Secondary | ICD-10-CM | POA: Diagnosis not present

## 2022-03-26 DIAGNOSIS — J3089 Other allergic rhinitis: Secondary | ICD-10-CM | POA: Diagnosis not present

## 2022-03-26 DIAGNOSIS — J301 Allergic rhinitis due to pollen: Secondary | ICD-10-CM | POA: Diagnosis not present

## 2022-03-31 ENCOUNTER — Ambulatory Visit: Payer: Medicare Other

## 2022-04-02 DIAGNOSIS — J3089 Other allergic rhinitis: Secondary | ICD-10-CM | POA: Diagnosis not present

## 2022-04-02 DIAGNOSIS — J3081 Allergic rhinitis due to animal (cat) (dog) hair and dander: Secondary | ICD-10-CM | POA: Diagnosis not present

## 2022-04-02 DIAGNOSIS — J301 Allergic rhinitis due to pollen: Secondary | ICD-10-CM | POA: Diagnosis not present

## 2022-04-09 ENCOUNTER — Ambulatory Visit
Admission: RE | Admit: 2022-04-09 | Discharge: 2022-04-09 | Disposition: A | Discharge status: Home / Self Care | Payer: Medicare Other | Source: Ambulatory Visit | Attending: Hematology and Oncology | Admitting: Hematology and Oncology

## 2022-04-09 DIAGNOSIS — J3089 Other allergic rhinitis: Secondary | ICD-10-CM | POA: Diagnosis not present

## 2022-04-09 DIAGNOSIS — Z1231 Encounter for screening mammogram for malignant neoplasm of breast: Secondary | ICD-10-CM | POA: Diagnosis not present

## 2022-04-09 DIAGNOSIS — J3081 Allergic rhinitis due to animal (cat) (dog) hair and dander: Secondary | ICD-10-CM | POA: Diagnosis not present

## 2022-04-09 DIAGNOSIS — J301 Allergic rhinitis due to pollen: Secondary | ICD-10-CM | POA: Diagnosis not present

## 2022-04-22 DIAGNOSIS — H25811 Combined forms of age-related cataract, right eye: Secondary | ICD-10-CM | POA: Diagnosis not present

## 2022-04-22 DIAGNOSIS — H2511 Age-related nuclear cataract, right eye: Secondary | ICD-10-CM | POA: Diagnosis not present

## 2022-04-24 DIAGNOSIS — J301 Allergic rhinitis due to pollen: Secondary | ICD-10-CM | POA: Diagnosis not present

## 2022-04-24 DIAGNOSIS — J3089 Other allergic rhinitis: Secondary | ICD-10-CM | POA: Diagnosis not present

## 2022-04-24 DIAGNOSIS — J3081 Allergic rhinitis due to animal (cat) (dog) hair and dander: Secondary | ICD-10-CM | POA: Diagnosis not present

## 2022-05-07 DIAGNOSIS — J3081 Allergic rhinitis due to animal (cat) (dog) hair and dander: Secondary | ICD-10-CM | POA: Diagnosis not present

## 2022-05-07 DIAGNOSIS — J3089 Other allergic rhinitis: Secondary | ICD-10-CM | POA: Diagnosis not present

## 2022-05-07 DIAGNOSIS — J301 Allergic rhinitis due to pollen: Secondary | ICD-10-CM | POA: Diagnosis not present

## 2022-05-13 ENCOUNTER — Other Ambulatory Visit: Payer: Self-pay | Admitting: *Deleted

## 2022-05-13 DIAGNOSIS — C50411 Malignant neoplasm of upper-outer quadrant of right female breast: Secondary | ICD-10-CM

## 2022-05-14 ENCOUNTER — Inpatient Hospital Stay: Payer: Medicare Other | Admitting: Hematology and Oncology

## 2022-05-14 ENCOUNTER — Other Ambulatory Visit: Payer: Self-pay

## 2022-05-14 ENCOUNTER — Inpatient Hospital Stay: Payer: Medicare Other | Attending: Hematology and Oncology

## 2022-05-14 ENCOUNTER — Ambulatory Visit: Payer: Medicare Other | Admitting: Hematology and Oncology

## 2022-05-14 ENCOUNTER — Encounter: Payer: Self-pay | Admitting: Hematology and Oncology

## 2022-05-14 ENCOUNTER — Other Ambulatory Visit: Payer: Medicare Other

## 2022-05-14 VITALS — BP 153/72 | HR 63 | Temp 97.7°F | Resp 16 | Ht 64.0 in | Wt 156.7 lb

## 2022-05-14 DIAGNOSIS — C50111 Malignant neoplasm of central portion of right female breast: Secondary | ICD-10-CM | POA: Insufficient documentation

## 2022-05-14 DIAGNOSIS — C50411 Malignant neoplasm of upper-outer quadrant of right female breast: Secondary | ICD-10-CM | POA: Diagnosis not present

## 2022-05-14 DIAGNOSIS — Z86006 Personal history of melanoma in-situ: Secondary | ICD-10-CM | POA: Diagnosis not present

## 2022-05-14 DIAGNOSIS — Z9011 Acquired absence of right breast and nipple: Secondary | ICD-10-CM | POA: Insufficient documentation

## 2022-05-14 DIAGNOSIS — Z170 Estrogen receptor positive status [ER+]: Secondary | ICD-10-CM

## 2022-05-14 DIAGNOSIS — Z807 Family history of other malignant neoplasms of lymphoid, hematopoietic and related tissues: Secondary | ICD-10-CM | POA: Diagnosis not present

## 2022-05-14 DIAGNOSIS — Z803 Family history of malignant neoplasm of breast: Secondary | ICD-10-CM | POA: Diagnosis not present

## 2022-05-14 DIAGNOSIS — Z9221 Personal history of antineoplastic chemotherapy: Secondary | ICD-10-CM | POA: Diagnosis not present

## 2022-05-14 DIAGNOSIS — M8580 Other specified disorders of bone density and structure, unspecified site: Secondary | ICD-10-CM | POA: Insufficient documentation

## 2022-05-14 DIAGNOSIS — Z808 Family history of malignant neoplasm of other organs or systems: Secondary | ICD-10-CM | POA: Diagnosis not present

## 2022-05-14 DIAGNOSIS — Z801 Family history of malignant neoplasm of trachea, bronchus and lung: Secondary | ICD-10-CM | POA: Insufficient documentation

## 2022-05-14 DIAGNOSIS — Z79810 Long term (current) use of selective estrogen receptor modulators (SERMs): Secondary | ICD-10-CM | POA: Insufficient documentation

## 2022-05-14 LAB — CBC WITH DIFFERENTIAL (CANCER CENTER ONLY)
Abs Immature Granulocytes: 0.01 10*3/uL (ref 0.00–0.07)
Basophils Absolute: 0 10*3/uL (ref 0.0–0.1)
Basophils Relative: 1 %
Eosinophils Absolute: 0.3 10*3/uL (ref 0.0–0.5)
Eosinophils Relative: 6 %
HCT: 39.7 % (ref 36.0–46.0)
Hemoglobin: 13.1 g/dL (ref 12.0–15.0)
Immature Granulocytes: 0 %
Lymphocytes Relative: 28 %
Lymphs Abs: 1.7 10*3/uL (ref 0.7–4.0)
MCH: 30.1 pg (ref 26.0–34.0)
MCHC: 33 g/dL (ref 30.0–36.0)
MCV: 91.3 fL (ref 80.0–100.0)
Monocytes Absolute: 0.5 10*3/uL (ref 0.1–1.0)
Monocytes Relative: 9 %
Neutro Abs: 3.3 10*3/uL (ref 1.7–7.7)
Neutrophils Relative %: 56 %
Platelet Count: 299 10*3/uL (ref 150–400)
RBC: 4.35 MIL/uL (ref 3.87–5.11)
RDW: 12.8 % (ref 11.5–15.5)
WBC Count: 5.9 10*3/uL (ref 4.0–10.5)
nRBC: 0 % (ref 0.0–0.2)

## 2022-05-14 LAB — CMP (CANCER CENTER ONLY)
ALT: 6 U/L (ref 0–44)
AST: 13 U/L — ABNORMAL LOW (ref 15–41)
Albumin: 4 g/dL (ref 3.5–5.0)
Alkaline Phosphatase: 43 U/L (ref 38–126)
Anion gap: 5 (ref 5–15)
BUN: 12 mg/dL (ref 8–23)
CO2: 30 mmol/L (ref 22–32)
Calcium: 8.9 mg/dL (ref 8.9–10.3)
Chloride: 104 mmol/L (ref 98–111)
Creatinine: 0.96 mg/dL (ref 0.44–1.00)
GFR, Estimated: >60 mL/min (ref >60)
Glucose, Bld: 155 mg/dL — ABNORMAL HIGH (ref 70–99)
Potassium: 3.6 mmol/L (ref 3.5–5.1)
Sodium: 139 mmol/L (ref 135–145)
Total Bilirubin: 0.4 mg/dL (ref 0.3–1.2)
Total Protein: 7.3 g/dL (ref 6.5–8.1)

## 2022-05-14 NOTE — Progress Notes (Signed)
[facility]  [phone] [phone]) [phone]     ID: Theresa Bradley DOB: 04/27/1951  MR#: [id number]  DVV#:[id number]  Patient Care Team: [name], MD as PCP - General [name], [name], MD as Consulting Physician (Allergy and Immunology) [name], OD as Referring Physician [name], MD as Consulting Physician (General Surgery) [name], [name], MD (Inactive) as Consulting Physician (Oncology) [name], MD as Attending Physician (Radiation Oncology) [name], MD as Referring Physician (Dermatology) [name], MD as Consulting Physician (Cardiology) Benay Pike, MD OTHER MD:   CHIEF COMPLAINT: 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 She began tamoxifen on 07/31/2020.   She has been taking gabapentin 300 mg for hot flashes and this has helped her sleep. Since her last visit, she had mammogram in May 2023, no mammographic evidence of malignancy. No other breast changes. No interim hospitalization. NO recent antibiotics.   COVID 19 VACCINATION STATUS: Status post Pfizer x2+ booster in October 2021   HISTORY OF CURRENT ILLNESS: From the original intake note:   RHINA KRAMME was my patient a little over 11 years ago when she underwent lumpectomy on 05/15/2008 for ductal carcinoma in situ and received radiation therapy under Dr. Valere Dross. 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  More recently, she presented to her PCP with a small palpable right breast lump around 11 o'clock, just superior and lateral to the nipple. She underwent bilateral diagnostic mammography with tomography and right breast ultrasonography at [facility] on 12/26/2019 showing: breast density  category B; 6 mm superficial mass in the right breast at 11 o'clock in the retroareolar region; no enlarged adenopathy in right axilla.  Accordingly on 12/30/2019 she proceeded to biopsy of the right breast area in question. The pathology from this procedure ([id number]) showed: invasive ductal carcinoma, grade 2. Prognostic indicators significant for: estrogen receptor, 95% positive and progesterone receptor, 80% positive, both with strong staining intensity. Proliferation marker Ki67 at 20%. HER2 positive by immunohistochemistry (3+).  The patient's subsequent history is as detailed below.   PAST MEDICAL HISTORY: Past Medical History:  Diagnosis Date   Anxiety    Breast cancer ([place]) 2009   right   Cancer ([place])    COUGH, CHRONIC 05/10/2010   Dysrhythmia    occationally "skips a beat"   Family history of brain cancer    Family history of breast cancer    Family history of lung cancer    Family history of multiple myeloma    Family history of skin cancer    GERD (gastroesophageal reflux disease)    HYPERTENSION 04/16/2009   HYPOTHYROIDISM 04/16/2009   OSTEOARTHRITIS 04/16/2009   Personal history of radiation therapy    SENILE LENTIGO 06/10/2010    PAST SURGICAL HISTORY: Past Surgical History:  Procedure Laterality Date   BREAST EXCISIONAL BIOPSY Right    BREAST EXCISIONAL BIOPSY Left    BREAST LUMPECTOMY Right 2009   radiation only   BREAST LUMPECTOMY WITH RADIOACTIVE SEED AND SENTINEL LYMPH NODE BIOPSY Right 01/31/2020   Procedure: RIGHT BREAST LUMPECTOMY WITH RADIOACTIVE SEED AND SENTINEL LYMPH NODE BIOPSY;  Surgeon: [name], MD;  Location: [facility];  Service: General;  Laterality: Right;  PEC BLOCK   BREAST  SURGERY  2009   X 2, cancer stage 0, lumpectomy   CESAREAN SECTION     1979, 1982   CHOLECYSTECTOMY     COLONOSCOPY     DILATATION & CURETTAGE/HYSTEROSCOPY WITH MYOSURE  11/2015   HERNIA REPAIR  2009   JOINT REPLACEMENT  2008   hip   MASTECTOMY  Right 2021   OPEN SURGICAL REPAIR OF GLUTEAL TENDON Left 07/18/2019   Procedure: Left hip bearing surface revision with gluteal tendon repair;  Surgeon: Gaynelle Arabian, MD;  Location: [facility];  Service: Orthopedics;  Laterality: Left;  2 hrs   PORTACATH PLACEMENT N/A 03/15/2020   Procedure: INSERTION PORT-A-CATH WITH ULTRASOUND GUIDANCE;  Surgeon: [name], MD;  Location: [facility];  Service: General;  Laterality: N/A;   SIMPLE MASTECTOMY WITH AXILLARY SENTINEL NODE BIOPSY Right 03/15/2020   Procedure: RIGHT SIMPLE MASTECTOMY;  Surgeon: [name], MD;  Location: [facility];  Service: General;  Laterality: Right;    FAMILY HISTORY: Family History  Problem Relation Age of Onset   Skin cancer Mother    Hyperlipidemia Father    Dementia Father    Skin cancer Father    Heart attack Father    Diabetes Brother    Hypertension Brother    Breast cancer Maternal Aunt 63   Breast cancer Maternal Aunt        dx. mid-70s   Cancer Paternal Aunt        unknown type, dx. >50   Lung cancer Paternal Uncle    Multiple myeloma Paternal Uncle    Brain cancer Cousin        dx. 80s, paternal cousin   Arthritis Other    Hypertension Other    Cancer Other        2 aunts   Colon cancer Neg Hx    Esophageal cancer Neg Hx    Pancreatic cancer Neg Hx    Stomach cancer Neg Hx   The patient's father is 30 years old and the patient's mother is 55 years old as of February 2021.  The patient has one brother, no sisters.  A maternal aunt was diagnosed with breast cancer at age 31.  A paternal uncle was diagnosed with lung cancer in his 1s and another paternal uncle with a "blood cancer" in his late 60s.  There is no history of ovarian pancreatic or prostate cancer in the family to her knowledge.   GYNECOLOGIC HISTORY:  No LMP recorded. Patient is postmenopausal. Menarche: 71 years old Age at first live birth: 71 years old [place] P 2 LMP HRT no  Hysterectomy?   No BSO?  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  Her husband Patrick Jupiter runs an environmental company that for example checks for mold and then remediates.  Son Delfino Lovett, 96 years old, lives in [place] and works as a Clinical biochemist.  He has 2 sons.  The patient's son Catalina Antigua 44 lives in [place] and works in Chief Executive Officer.  He married October 2021.  The patient is Episcopalian    ADVANCED DIRECTIVES: In the absence of any documentation to the contrary, the patient's spouse is their HCPOA.    HEALTH MAINTENANCE: Social History   Tobacco Use   Smoking status: Never   Smokeless tobacco: Never   Tobacco comments:    father smoked when she was younger   Vaping Use   Vaping  Use: Never used  Substance Use Topics   Alcohol use: Yes    Comment: occasionally    Drug use: No     Colonoscopy: 03/2009/[facility]  PAP: 10/2013, negative  Bone density: 11/2015, T score -1.7   Allergies  Allergen Reactions   Codeine Sulfate Nausea And Vomiting   Erythromycin Base Other (See Comments)    Stomach ache   Esomeprazole Magnesium     REACTION: GI upset    Current Outpatient Medications  Medication Sig Dispense Refill   ALPRAZolam (XANAX) 0.25 MG tablet TAKE 1/2 A TABLET BY MOUTH AT BEDTIME AS NEEDED FOR INSOMNIA 45 tablet 0   amLODipine (NORVASC) 5 MG tablet TAKE 1 TABLET BY MOUTH EVERY DAY 90 tablet 3   budesonide-formoterol (SYMBICORT) 80-4.5 MCG/ACT inhaler Inhale 1 puff into the lungs daily in the afternoon.     CALCIUM-VITAMIN D PO Take 2 tablets by mouth daily.     citalopram (CELEXA) 20 MG tablet TAKE 1 TABLET BY MOUTH EVERY DAY 90 tablet 0   EPIPEN 2-PAK 0.3 MG/0.3ML SOAJ injection Inject 0.3 mg into the muscle as needed for anaphylaxis.      gabapentin (NEURONTIN) 300 MG capsule TAKE 1 CAPSULE BY MOUTH EVERYDAY AT BEDTIME 90 capsule 4   levothyroxine (SYNTHROID) 50 MCG tablet  TAKE 1 TABLET BY MOUTH EVERY DAY 90 tablet 0   meloxicam (MOBIC) 15 MG tablet TAKE 1 TABLET BY MOUTH EVERY DAY 90 tablet 0   Methylcellulose, Laxative, (CITRUCEL PO) Take 2 tablets by mouth at bedtime.     polyethylene glycol (MIRALAX / GLYCOLAX) 17 g packet Take 17 g by mouth every other day.     tamoxifen (NOLVADEX) 20 MG tablet TAKE 1 TABLET BY MOUTH EVERY DAY, START 07/31/20 90 tablet 4   tretinoin (RETIN-A) 0.025 % cream Apply topically at bedtime. 45 g 6   No current facility-administered medications for this visit.    OBJECTIVE: White woman in no acute distress  There were no vitals filed for this visit.    There is no height or weight on file to calculate BMI.   Wt Readings from Last 3 Encounters:  01/14/22 154 lb (69.9 kg)  09/30/21 154 lb 6.4 oz (70 kg)  09/27/21 150 lb (68 kg)     ECOG FS:1 - Symptomatic but completely ambulatory  Physical Exam Constitutional:      General: She is not in acute distress.    Appearance: Normal appearance.  Cardiovascular:     Rate and Rhythm: Normal rate and regular rhythm.     Pulses: Normal pulses.     Heart sounds: Normal heart sounds.  Chest:     Comments: Right mastectomy scar with no recurrence. Left breast normal to inspection and palpation. No regional adenopathy Musculoskeletal:     Cervical back: Normal range of motion and neck supple. No rigidity.  Lymphadenopathy:     Cervical: No cervical adenopathy.  Skin:    General: Skin is warm and dry.  Neurological:     Mental Status: She is alert.      LAB RESULTS:  CMP     Component Value Date/Time   NA 141 09/18/2021 1202   K 3.9 09/18/2021 1202   CL 104 09/18/2021 1202   CO2 27 09/18/2021 1202   GLUCOSE 84 09/18/2021 1202   BUN 14 09/18/2021 1202   CREATININE 0.86 09/18/2021 1202   CREATININE 0.81 01/11/2020 1209   CALCIUM 8.9 09/18/2021 1202   PROT 7.1 09/18/2021 1202  ALBUMIN 3.6 09/18/2021 1202   AST 13 (L) 09/18/2021 1202   AST 14 (L) 01/11/2020 1209    ALT 6 09/18/2021 1202   ALT 13 01/11/2020 1209   ALKPHOS 36 (L) 09/18/2021 1202   BILITOT 0.4 09/18/2021 1202   BILITOT 0.5 01/11/2020 1209   GFRNONAA >60 09/18/2021 1202   GFRNONAA >60 01/11/2020 1209   GFRAA >60 08/14/2020 1227   GFRAA >60 01/11/2020 1209    No results found for: "TOTALPROTELP", "ALBUMINELP", "A1GS", "A2GS", "BETS", "BETA2SER", "GAMS", "MSPIKE", "SPEI"  Lab Results  Component Value Date   WBC 5.9 05/14/2022   NEUTROABS 3.3 05/14/2022   HGB 13.1 05/14/2022   HCT 39.7 05/14/2022   MCV 91.3 05/14/2022   PLT 299 05/14/2022    No results found for: "LABCA2"  No components found for: "LPNPYY511"  No results for input(s): "INR" in the last 168 hours.  No results found for: "LABCA2"  No results found for: "MYT117"  No results found for: "CAN125"  No results found for: "CAN153"  No results found for: "CA2729"  No components found for: "HGQUANT"  No results found for: "CEA1", "CEA" / No results found for: "CEA1", "CEA"   No results found for: "AFPTUMOR"  No results found for: "CHROMOGRNA"  No results found for: "KPAFRELGTCHN", "LAMBDASER", "KAPLAMBRATIO" (kappa/lambda light chains)  No results found for: "HGBA", "HGBA2QUANT", "HGBFQUANT", "HGBSQUAN" (Hemoglobinopathy evaluation)   No results found for: "LDH"  No results found for: "IRON", "TIBC", "IRONPCTSAT" (Iron and TIBC)  No results found for: "FERRITIN"  Urinalysis    Component Value Date/Time   COLORURINE yellow 05/24/2010 0855   APPEARANCEUR Clear 05/24/2010 0855   LABSPEC 1.015 05/24/2010 0855   PHURINE 6.0 05/24/2010 0855   GLUCOSEU NEGATIVE 09/22/2007 1110   HGBUR negative 05/24/2010 0855   BILIRUBINUR n 06/29/2019 1533   KETONESUR NEGATIVE 09/22/2007 1110   PROTEINUR Negative 06/29/2019 1533   PROTEINUR NEGATIVE 09/22/2007 1110   UROBILINOGEN 0.2 06/29/2019 1533   UROBILINOGEN 0.2 05/24/2010 0855   NITRITE n 06/29/2019 1533   NITRITE negative 05/24/2010 0855    LEUKOCYTESUR Negative 06/29/2019 1533    STUDIES: No results found.    ELIGIBLE FOR AVAILABLE RESEARCH PROTOCOL: no  ASSESSMENT: 71 y.o. [place] woman status post right breast periareolar biopsy 12/30/2019 for a clinical T1a N0, stage I invasive ductal carcinoma, grade 2, estrogen and progesterone receptor positive, HER-2 amplified, with an MIB-one of 20%.  (1) history of prior right lumpectomy June 2009 for ductal carcinoma in situ  (a) status post adjuvant radiation  (b) did not receive antiestrogens  She then had right breast melanoma in 2020, resected, didn't need adjuvant immunotherapy.  (2) status post repeat right lumpectomy and sentinel lymph node sampling 01/31/2020 for a pT1c pN0, stage IA invasive ductal carcinoma, grade 2, with positive margins  (a) total one sentinel node removed  (3) s/p right mastectomy 03/15/2020 showing residual invasive ductal carcinoma measuring 0.9 cm, but negative margins (added to prior 1.1 lumpectomy, still T1 N0 or stage IA)  (a) one additional axillary node was removed (total 2 right axillary nodes removed)  (3) adjuvant chemotherapy with paclitaxel and trastuzumab weekly x 12 started 04/24/2020, completing 10 doses 06/26/2020   (a) final 2 doses omitted secondary to peripheral neuropathy  (4) trastuzumab to be continued every 21 days to total one year (through May 2022).  (a) echo 01/17/2020 shows an ejection fraction in the 60-65% range  (b) echo 05/30/2020 shows an ejection fraction of 60-65%.  (c) echo 08/28/2020 shows  an ejection fraction in the 55-60% range  (d) echo 12/14/2020 shows an ejection fraction in the 60-65% range.  (e) echo 03/26/2021 shows an EF in the 55% range with decreased GLS  (f) repeat echo 07/26/2021 shows the left ventricular ejection fraction back to 60-65%  (5) started tamoxifen 07/31/2020  (a) Bone density in 11/2015 shows osteopenia with T score of -1.7.  (b) bone density 03/04/2021 shows a T score of  -2.0  (6) genetics testing 02/01/2020 through the [facility] STAT panel found no deleterious mutations then ATM, BRCA1, BRCA2, CDH1, CHEK2, PALB2, PTEN, STK11 and TP53.    PLAN:  She is here for a follow up on tamoxifen.  She is tolerating this well, except for nocturnal hot flashes on gabapentin No concerns on exam today or recent physical exam. Mammogram with no evidence of malignancy Continue Tamoxifen for 5 yrs. RTC with [place] in 1 yr. Annual dermatology follow-up recommended.  She was also encouraged to go back for full colonoscopy since her last colonoscopy was incomplete Total encounter time 30  minutes.*   *Total Encounter Time as defined by the Centers for Medicare and Medicaid Services includes, in addition to the face-to-face time of a patient visit (documented in the note above) non-face-to-face time: obtaining and reviewing outside history, ordering and reviewing medications, tests or procedures, care coordination (communications with other health care professionals or caregivers) and documentation in the medical record.

## 2022-05-19 ENCOUNTER — Other Ambulatory Visit: Payer: Self-pay | Admitting: Family Medicine

## 2022-06-04 DIAGNOSIS — J3081 Allergic rhinitis due to animal (cat) (dog) hair and dander: Secondary | ICD-10-CM | POA: Diagnosis not present

## 2022-06-04 DIAGNOSIS — J301 Allergic rhinitis due to pollen: Secondary | ICD-10-CM | POA: Diagnosis not present

## 2022-06-04 DIAGNOSIS — J3089 Other allergic rhinitis: Secondary | ICD-10-CM | POA: Diagnosis not present

## 2022-06-07 ENCOUNTER — Other Ambulatory Visit: Payer: Self-pay | Admitting: Family Medicine

## 2022-06-09 DIAGNOSIS — J3081 Allergic rhinitis due to animal (cat) (dog) hair and dander: Secondary | ICD-10-CM | POA: Diagnosis not present

## 2022-06-09 DIAGNOSIS — J3089 Other allergic rhinitis: Secondary | ICD-10-CM | POA: Diagnosis not present

## 2022-06-09 DIAGNOSIS — J301 Allergic rhinitis due to pollen: Secondary | ICD-10-CM | POA: Diagnosis not present

## 2022-06-12 DIAGNOSIS — J301 Allergic rhinitis due to pollen: Secondary | ICD-10-CM | POA: Diagnosis not present

## 2022-06-12 DIAGNOSIS — J3089 Other allergic rhinitis: Secondary | ICD-10-CM | POA: Diagnosis not present

## 2022-06-12 DIAGNOSIS — J3081 Allergic rhinitis due to animal (cat) (dog) hair and dander: Secondary | ICD-10-CM | POA: Diagnosis not present

## 2022-06-17 DIAGNOSIS — J3089 Other allergic rhinitis: Secondary | ICD-10-CM | POA: Diagnosis not present

## 2022-06-17 DIAGNOSIS — J3081 Allergic rhinitis due to animal (cat) (dog) hair and dander: Secondary | ICD-10-CM | POA: Diagnosis not present

## 2022-06-17 DIAGNOSIS — J301 Allergic rhinitis due to pollen: Secondary | ICD-10-CM | POA: Diagnosis not present

## 2022-06-21 ENCOUNTER — Other Ambulatory Visit: Payer: Self-pay | Admitting: Family Medicine

## 2022-06-25 DIAGNOSIS — J3081 Allergic rhinitis due to animal (cat) (dog) hair and dander: Secondary | ICD-10-CM | POA: Diagnosis not present

## 2022-06-25 DIAGNOSIS — J301 Allergic rhinitis due to pollen: Secondary | ICD-10-CM | POA: Diagnosis not present

## 2022-06-25 DIAGNOSIS — J3089 Other allergic rhinitis: Secondary | ICD-10-CM | POA: Diagnosis not present

## 2022-06-30 DIAGNOSIS — J3089 Other allergic rhinitis: Secondary | ICD-10-CM | POA: Diagnosis not present

## 2022-06-30 DIAGNOSIS — J3081 Allergic rhinitis due to animal (cat) (dog) hair and dander: Secondary | ICD-10-CM | POA: Diagnosis not present

## 2022-06-30 DIAGNOSIS — J301 Allergic rhinitis due to pollen: Secondary | ICD-10-CM | POA: Diagnosis not present

## 2022-07-07 DIAGNOSIS — J301 Allergic rhinitis due to pollen: Secondary | ICD-10-CM | POA: Diagnosis not present

## 2022-07-07 DIAGNOSIS — J3081 Allergic rhinitis due to animal (cat) (dog) hair and dander: Secondary | ICD-10-CM | POA: Diagnosis not present

## 2022-07-07 DIAGNOSIS — J3089 Other allergic rhinitis: Secondary | ICD-10-CM | POA: Diagnosis not present

## 2022-07-08 ENCOUNTER — Other Ambulatory Visit: Payer: Self-pay | Admitting: Family Medicine

## 2022-07-14 DIAGNOSIS — J3089 Other allergic rhinitis: Secondary | ICD-10-CM | POA: Diagnosis not present

## 2022-07-14 DIAGNOSIS — J301 Allergic rhinitis due to pollen: Secondary | ICD-10-CM | POA: Diagnosis not present

## 2022-07-14 DIAGNOSIS — J3081 Allergic rhinitis due to animal (cat) (dog) hair and dander: Secondary | ICD-10-CM | POA: Diagnosis not present

## 2022-07-21 DIAGNOSIS — J3081 Allergic rhinitis due to animal (cat) (dog) hair and dander: Secondary | ICD-10-CM | POA: Diagnosis not present

## 2022-07-21 DIAGNOSIS — J301 Allergic rhinitis due to pollen: Secondary | ICD-10-CM | POA: Diagnosis not present

## 2022-07-21 DIAGNOSIS — J3089 Other allergic rhinitis: Secondary | ICD-10-CM | POA: Diagnosis not present

## 2022-07-24 DIAGNOSIS — Z96642 Presence of left artificial hip joint: Secondary | ICD-10-CM | POA: Diagnosis not present

## 2022-08-04 DIAGNOSIS — J3081 Allergic rhinitis due to animal (cat) (dog) hair and dander: Secondary | ICD-10-CM | POA: Diagnosis not present

## 2022-08-04 DIAGNOSIS — J3089 Other allergic rhinitis: Secondary | ICD-10-CM | POA: Diagnosis not present

## 2022-08-04 DIAGNOSIS — J301 Allergic rhinitis due to pollen: Secondary | ICD-10-CM | POA: Diagnosis not present

## 2022-08-18 DIAGNOSIS — J3089 Other allergic rhinitis: Secondary | ICD-10-CM | POA: Diagnosis not present

## 2022-08-18 DIAGNOSIS — J301 Allergic rhinitis due to pollen: Secondary | ICD-10-CM | POA: Diagnosis not present

## 2022-08-18 DIAGNOSIS — J3081 Allergic rhinitis due to animal (cat) (dog) hair and dander: Secondary | ICD-10-CM | POA: Diagnosis not present

## 2022-08-21 ENCOUNTER — Other Ambulatory Visit: Payer: Self-pay | Admitting: Family Medicine

## 2022-08-27 DIAGNOSIS — K2100 Gastro-esophageal reflux disease with esophagitis, without bleeding: Secondary | ICD-10-CM | POA: Diagnosis not present

## 2022-08-27 DIAGNOSIS — J3081 Allergic rhinitis due to animal (cat) (dog) hair and dander: Secondary | ICD-10-CM | POA: Diagnosis not present

## 2022-08-27 DIAGNOSIS — R052 Subacute cough: Secondary | ICD-10-CM | POA: Diagnosis not present

## 2022-08-27 DIAGNOSIS — J301 Allergic rhinitis due to pollen: Secondary | ICD-10-CM | POA: Diagnosis not present

## 2022-08-27 DIAGNOSIS — J3089 Other allergic rhinitis: Secondary | ICD-10-CM | POA: Diagnosis not present

## 2022-09-01 DIAGNOSIS — J3089 Other allergic rhinitis: Secondary | ICD-10-CM | POA: Diagnosis not present

## 2022-09-01 DIAGNOSIS — J301 Allergic rhinitis due to pollen: Secondary | ICD-10-CM | POA: Diagnosis not present

## 2022-09-01 DIAGNOSIS — J3081 Allergic rhinitis due to animal (cat) (dog) hair and dander: Secondary | ICD-10-CM | POA: Diagnosis not present

## 2022-09-15 ENCOUNTER — Other Ambulatory Visit: Payer: Self-pay | Admitting: Family Medicine

## 2022-09-15 DIAGNOSIS — J3081 Allergic rhinitis due to animal (cat) (dog) hair and dander: Secondary | ICD-10-CM | POA: Diagnosis not present

## 2022-09-15 DIAGNOSIS — J3089 Other allergic rhinitis: Secondary | ICD-10-CM | POA: Diagnosis not present

## 2022-09-15 DIAGNOSIS — J301 Allergic rhinitis due to pollen: Secondary | ICD-10-CM | POA: Diagnosis not present

## 2022-09-19 ENCOUNTER — Other Ambulatory Visit: Payer: Self-pay | Admitting: Family Medicine

## 2022-09-19 ENCOUNTER — Telehealth: Payer: Self-pay | Admitting: *Deleted

## 2022-09-19 ENCOUNTER — Encounter: Payer: Self-pay | Admitting: *Deleted

## 2022-09-19 MED ORDER — ALPRAZOLAM 0.25 MG PO TABS
ORAL_TABLET | ORAL | 0 refills | Status: DC
Start: 2022-09-19 — End: 2023-06-29

## 2022-09-19 NOTE — Patient Instructions (Signed)
Visit Information  Thank you for taking time to visit with me today. Please don't hesitate to contact me if I can be of assistance to you.   Following are the goals we discussed today:   Goals Addressed               This Visit's Progress     COMPLETED: Needs refill on medication (pt-stated)        Care Coordination Interventions: Reviewed medications with patient and discussed adherence with all her prescribed medications Collaborated with provider regarding the requested refill (Xanax 0.25 mg) Reviewed scheduled/upcoming provider appointments including pending appointments Screening for signs and symptoms of depression related to chronic disease state  Assessed social determinant of health barriers          Please call the care guide team at [phone] if you need to cancel or reschedule your appointment.   If you are experiencing a Mental Health or Post or need someone to talk to, please call the Suicide and Crisis Lifeline: 988  Patient verbalizes understanding of instructions and care plan provided today and agrees to view in [place]. Active MyChart status and patient understanding of how to access instructions and care plan via MyChart confirmed with patient.     No further follow up required: No additional needs  Raina Mina, RN Care Management Coordinator [place] Office [phone]

## 2022-09-19 NOTE — Patient Outreach (Signed)
  Care Coordination   Initial Visit Note   09/19/2022 Name: Theresa Bradley MRN: [id number] DOB: 26-Nov-1950  Theresa Bradley is a 71 y.o. year old female who sees [name], [name], MD for primary care. I spoke with  [name] by phone today.  What matters to the patients health and wellness today?  Need refill on Xanax medication    Goals Addressed               This Visit's Progress     COMPLETED: Needs refill on medication (pt-stated)        Care Coordination Interventions: Reviewed medications with patient and discussed adherence with all her prescribed medications Collaborated with provider regarding the requested refill (Xanax 0.25 mg) Reviewed scheduled/upcoming provider appointments including pending appointments Screening for signs and symptoms of depression related to chronic disease state  Assessed social determinant of health barriers          SDOH assessments and interventions completed:  Yes  SDOH Interventions Today    Flowsheet Row Most Recent Value  SDOH Interventions   Food Insecurity Interventions Intervention Not Indicated  Housing Interventions Intervention Not Indicated  Transportation Interventions Intervention Not Indicated  Utilities Interventions Intervention Not Indicated        Care Coordination Interventions Activated:  Yes  Care Coordination Interventions:  Yes, provided   Follow up plan: No further intervention required.   Encounter Outcome:  Pt. Visit Completed   Raina Mina, RN Care Management Coordinator [place] Office [phone]

## 2022-09-29 ENCOUNTER — Other Ambulatory Visit: Payer: Self-pay | Admitting: Family Medicine

## 2022-09-29 DIAGNOSIS — J3089 Other allergic rhinitis: Secondary | ICD-10-CM | POA: Diagnosis not present

## 2022-09-29 DIAGNOSIS — J3081 Allergic rhinitis due to animal (cat) (dog) hair and dander: Secondary | ICD-10-CM | POA: Diagnosis not present

## 2022-09-29 DIAGNOSIS — J301 Allergic rhinitis due to pollen: Secondary | ICD-10-CM | POA: Diagnosis not present

## 2022-10-13 DIAGNOSIS — J3081 Allergic rhinitis due to animal (cat) (dog) hair and dander: Secondary | ICD-10-CM | POA: Diagnosis not present

## 2022-10-13 DIAGNOSIS — J3089 Other allergic rhinitis: Secondary | ICD-10-CM | POA: Diagnosis not present

## 2022-10-13 DIAGNOSIS — J301 Allergic rhinitis due to pollen: Secondary | ICD-10-CM | POA: Diagnosis not present

## 2022-10-19 ENCOUNTER — Other Ambulatory Visit: Payer: Self-pay | Admitting: Family Medicine

## 2022-10-23 ENCOUNTER — Other Ambulatory Visit: Payer: Self-pay | Admitting: Family Medicine

## 2022-10-27 DIAGNOSIS — J3089 Other allergic rhinitis: Secondary | ICD-10-CM | POA: Diagnosis not present

## 2022-10-27 DIAGNOSIS — J3081 Allergic rhinitis due to animal (cat) (dog) hair and dander: Secondary | ICD-10-CM | POA: Diagnosis not present

## 2022-10-27 DIAGNOSIS — J301 Allergic rhinitis due to pollen: Secondary | ICD-10-CM | POA: Diagnosis not present

## 2022-11-10 DIAGNOSIS — J301 Allergic rhinitis due to pollen: Secondary | ICD-10-CM | POA: Diagnosis not present

## 2022-11-10 DIAGNOSIS — J3081 Allergic rhinitis due to animal (cat) (dog) hair and dander: Secondary | ICD-10-CM | POA: Diagnosis not present

## 2022-11-10 DIAGNOSIS — J3089 Other allergic rhinitis: Secondary | ICD-10-CM | POA: Diagnosis not present

## 2022-11-14 ENCOUNTER — Other Ambulatory Visit: Payer: Self-pay | Admitting: Family Medicine

## 2022-11-21 ENCOUNTER — Other Ambulatory Visit: Payer: Self-pay | Admitting: Family Medicine

## 2022-11-25 DIAGNOSIS — J3081 Allergic rhinitis due to animal (cat) (dog) hair and dander: Secondary | ICD-10-CM | POA: Diagnosis not present

## 2022-11-25 DIAGNOSIS — J301 Allergic rhinitis due to pollen: Secondary | ICD-10-CM | POA: Diagnosis not present

## 2022-11-25 DIAGNOSIS — J3089 Other allergic rhinitis: Secondary | ICD-10-CM | POA: Diagnosis not present

## 2022-11-26 ENCOUNTER — Encounter: Payer: Self-pay | Admitting: Family Medicine

## 2022-11-26 ENCOUNTER — Ambulatory Visit (INDEPENDENT_AMBULATORY_CARE_PROVIDER_SITE_OTHER): Payer: Medicare Other | Admitting: Family Medicine

## 2022-11-26 VITALS — BP 144/70 | HR 60 | Temp 97.6°F | Ht 64.0 in | Wt 159.6 lb

## 2022-11-26 DIAGNOSIS — E039 Hypothyroidism, unspecified: Secondary | ICD-10-CM | POA: Diagnosis not present

## 2022-11-26 DIAGNOSIS — I10 Essential (primary) hypertension: Secondary | ICD-10-CM

## 2022-11-26 DIAGNOSIS — R739 Hyperglycemia, unspecified: Secondary | ICD-10-CM | POA: Diagnosis not present

## 2022-11-26 DIAGNOSIS — F339 Major depressive disorder, recurrent, unspecified: Secondary | ICD-10-CM

## 2022-11-26 DIAGNOSIS — M1991 Primary osteoarthritis, unspecified site: Secondary | ICD-10-CM

## 2022-11-26 MED ORDER — LEVOTHYROXINE SODIUM 50 MCG PO TABS: 50.0000 ug | ORAL_TABLET | Freq: Every day | ORAL | 0 refills | Status: DC

## 2022-11-26 MED ORDER — MELOXICAM 15 MG PO TABS: 15.0000 mg | ORAL_TABLET | Freq: Every day | ORAL | 3 refills | Status: DC

## 2022-11-26 MED ORDER — LEVOTHYROXINE SODIUM 50 MCG PO TABS: 50.0000 ug | ORAL_TABLET | Freq: Every day | ORAL | 3 refills | Status: DC

## 2022-11-26 MED ORDER — CITALOPRAM HYDROBROMIDE 20 MG PO TABS: 20.0000 mg | ORAL_TABLET | Freq: Every day | ORAL | 3 refills | Status: DC

## 2022-11-26 NOTE — Progress Notes (Signed)
Established Patient Office Visit  Subjective   Patient ID: Theresa Bradley, female    DOB: 1951/10/16  Age: 72 y.o. MRN: [id number]  Chief Complaint  Patient presents with   Medication Consultation    HPI   Theresa Bradley is seen for medical follow-up.  Needs refills of several medications.  She needs refills of levothyroxine, meloxicam, and citalopram  She has chronic problems including history of hypertension, GERD, hypothyroidism, osteoarthritis, melanoma, breast cancer.  In looking back over labs she has had through oncology she has had several blood sugars that were mildly elevated but not clear if any of these were fasting labs.  No family history of type 2 diabetes.  She takes low-dose levothyroxine 50 mcg daily.  Overdue for labs.  She takes meloxicam 15 mg daily for osteoarthritis involving multiple joints.  No recent abdominal pain.  Renal function normal.  She has history of recurrent depression stable on citalopram 20 mg daily.  Hypertension treated with amlodipine 5 mg daily.  Blood pressure slightly up today but generally well-controlled.  No recent headaches or dizziness.  Past Medical History:  Diagnosis Date   Anxiety    Breast cancer ([place]) 2009   right   Cancer ([place])    COUGH, CHRONIC 05/10/2010   Dysrhythmia    occationally "skips a beat"   Family history of brain cancer    Family history of breast cancer    Family history of lung cancer    Family history of multiple myeloma    Family history of skin cancer    GERD (gastroesophageal reflux disease)    HYPERTENSION 04/16/2009   HYPOTHYROIDISM 04/16/2009   OSTEOARTHRITIS 04/16/2009   Personal history of radiation therapy    SENILE LENTIGO 06/10/2010   Past Surgical History:  Procedure Laterality Date   BREAST EXCISIONAL BIOPSY Right    BREAST EXCISIONAL BIOPSY Left    BREAST LUMPECTOMY Right 2009   radiation only   BREAST LUMPECTOMY WITH RADIOACTIVE SEED AND SENTINEL LYMPH NODE BIOPSY Right 01/31/2020    Procedure: RIGHT BREAST LUMPECTOMY WITH RADIOACTIVE SEED AND SENTINEL LYMPH NODE BIOPSY;  Surgeon: [name], MD;  Location: [facility];  Service: General;  Laterality: Right;  [place]  2009   X 2, cancer stage 0, lumpectomy   CESAREAN SECTION     1979, [place]  11/2015   HERNIA REPAIR  2009   JOINT REPLACEMENT  2008   hip   MASTECTOMY Right 2021   OPEN SURGICAL REPAIR OF GLUTEAL TENDON Left 07/18/2019   Procedure: Left hip bearing surface revision with gluteal tendon repair;  Surgeon: Gaynelle Arabian, MD;  Location: [facility];  Service: Orthopedics;  Laterality: Left;  2 hrs   PORTACATH PLACEMENT N/A 03/15/2020   Procedure: INSERTION PORT-A-CATH WITH ULTRASOUND GUIDANCE;  Surgeon: [name], MD;  Location: [facility];  Service: General;  Laterality: N/A;   SIMPLE MASTECTOMY WITH AXILLARY SENTINEL NODE BIOPSY Right 03/15/2020   Procedure: RIGHT SIMPLE MASTECTOMY;  Surgeon: [name], MD;  Location: [facility];  Service: General;  Laterality: Right;    reports that she has never smoked. She has never used smokeless tobacco. She reports current alcohol use. She reports that she does not use drugs. family history includes Arthritis in an other family member; Brain cancer in her cousin; Breast cancer in her maternal aunt; Breast cancer (age  of onset: 57) in her maternal aunt; Cancer in her paternal aunt and another family member; Dementia in her father; Diabetes in her brother; Heart attack in her father; Hyperlipidemia in her father; Hypertension in her brother and another family member; Lung cancer in her paternal uncle; Multiple myeloma in her paternal uncle; Skin cancer in her father and mother. Allergies  Allergen Reactions   Codeine Sulfate Nausea And Vomiting   Erythromycin Base Other (See Comments)    Stomach ache    Esomeprazole Magnesium     REACTION: GI upset    Review of Systems  Constitutional:  Negative for malaise/fatigue.  Eyes:  Negative for blurred vision.  Respiratory:  Negative for shortness of breath.   Cardiovascular:  Negative for chest pain.  Neurological:  Negative for dizziness, weakness and headaches.      Objective:     BP (!) 144/70 (BP Location: Left Arm, Patient Position: Sitting, Cuff Size: Normal)   Pulse 60   Temp 97.6 F (36.4 C) (Oral)   Ht _0  (1.626 m)   Wt 159 lb 9.6 oz (72.4 kg)   SpO2 95%   BMI 27.40 kg/m    Physical Exam Vitals reviewed.  Constitutional:      Appearance: She is well-developed.  Eyes:     Pupils: Pupils are equal, round, and reactive to light.  Neck:     Thyroid: No thyromegaly.     Vascular: No JVD.  Cardiovascular:     Rate and Rhythm: Normal rate and regular rhythm.     Heart sounds:     No gallop.  Pulmonary:     Effort: Pulmonary effort is normal. No respiratory distress.     Breath sounds: Normal breath sounds. No wheezing or rales.  Musculoskeletal:     Cervical back: Neck supple.     Right lower leg: No edema.     Left lower leg: No edema.  Neurological:     Mental Status: She is alert.      No results found for any visits on 11/26/22.    The 10-year ASCVD risk score (Arnett DK, et al., 2019) is: 21.6%    Assessment & Plan:   #1 hypertension.  Slightly up today but generally well-controlled.  We discussed nonpharmacologic management with trying to lose some weight and watch sodium intake closely.  She will monitor closely at home over the next few weeks and be in touch if consistently greater than 177 systolic.  If remaining up at home consider titration of amlodipine to 10 mg daily or possibly low-dose thiazide as she seems to have isolated systolic hypertension  #2 hypothyroidism.  Our lab was closed at time of her appointment..  Future order placed for TSH.  Refill levothyroxine for 1 year  #3 history  of hyperglycemia by multiple recent labs through oncology.  These were apparently not fasting.  Check A1c with future lab  #4 recurrent depression stable on citalopram 20 mg daily.  Continue current dose of citalopram with refills given for 1 year.  #5 osteoarthritis involving multiple joints.  Refill meloxicam 15 mg once daily.  Cautioned about GI issues such as ulcer with chronic use.   No follow-ups on file.    Carolann Littler, MD

## 2022-11-26 NOTE — Patient Instructions (Signed)
Monitor blood pressure and be in touch if consistently > 964 systolic (top number).

## 2022-11-27 ENCOUNTER — Other Ambulatory Visit (INDEPENDENT_AMBULATORY_CARE_PROVIDER_SITE_OTHER): Payer: Medicare Other

## 2022-11-27 DIAGNOSIS — E039 Hypothyroidism, unspecified: Secondary | ICD-10-CM | POA: Diagnosis not present

## 2022-11-27 DIAGNOSIS — R739 Hyperglycemia, unspecified: Secondary | ICD-10-CM | POA: Diagnosis not present

## 2022-11-27 LAB — HEMOGLOBIN A1C: Hgb A1c MFr Bld: 5.7 % (ref 4.6–6.5)

## 2022-11-27 LAB — TSH: TSH: 2.25 u[IU]/mL (ref 0.35–5.50)

## 2022-12-04 DIAGNOSIS — H40013 Open angle with borderline findings, low risk, bilateral: Secondary | ICD-10-CM | POA: Diagnosis not present

## 2022-12-04 DIAGNOSIS — H349 Unspecified retinal vascular occlusion: Secondary | ICD-10-CM | POA: Diagnosis not present

## 2022-12-05 DIAGNOSIS — J3081 Allergic rhinitis due to animal (cat) (dog) hair and dander: Secondary | ICD-10-CM | POA: Diagnosis not present

## 2022-12-05 DIAGNOSIS — J301 Allergic rhinitis due to pollen: Secondary | ICD-10-CM | POA: Diagnosis not present

## 2022-12-08 DIAGNOSIS — J301 Allergic rhinitis due to pollen: Secondary | ICD-10-CM | POA: Diagnosis not present

## 2022-12-08 DIAGNOSIS — J3089 Other allergic rhinitis: Secondary | ICD-10-CM | POA: Diagnosis not present

## 2022-12-08 DIAGNOSIS — J3081 Allergic rhinitis due to animal (cat) (dog) hair and dander: Secondary | ICD-10-CM | POA: Diagnosis not present

## 2022-12-14 ENCOUNTER — Other Ambulatory Visit: Payer: Self-pay | Admitting: Family Medicine

## 2022-12-15 ENCOUNTER — Encounter: Payer: Self-pay | Admitting: Family Medicine

## 2022-12-15 DIAGNOSIS — Z79899 Other long term (current) drug therapy: Secondary | ICD-10-CM

## 2022-12-16 MED ORDER — HYDROCHLOROTHIAZIDE 12.5 MG PO CAPS: 12.5000 mg | ORAL_CAPSULE | Freq: Every day | ORAL | 0 refills | Status: DC

## 2022-12-22 DIAGNOSIS — J3089 Other allergic rhinitis: Secondary | ICD-10-CM | POA: Diagnosis not present

## 2022-12-22 DIAGNOSIS — J301 Allergic rhinitis due to pollen: Secondary | ICD-10-CM | POA: Diagnosis not present

## 2022-12-22 DIAGNOSIS — J3081 Allergic rhinitis due to animal (cat) (dog) hair and dander: Secondary | ICD-10-CM | POA: Diagnosis not present

## 2022-12-23 ENCOUNTER — Other Ambulatory Visit: Payer: Self-pay

## 2022-12-23 ENCOUNTER — Telehealth: Payer: Self-pay | Admitting: Gastroenterology

## 2022-12-23 DIAGNOSIS — Z539 Procedure and treatment not carried out, unspecified reason: Secondary | ICD-10-CM

## 2022-12-23 NOTE — Telephone Encounter (Signed)
Pt was supposed to have virtual colon. Order placed. Discussed with pt we will call her tomorrow regarding appt.

## 2022-12-23 NOTE — Telephone Encounter (Signed)
Patient called, stated she had a procedure in 2022, but was unable to complete it. Patient states she was told to complete a virtual colonoscopy, however recall is in for 2027. Patient is requesting to speak to a nurse, please advise.

## 2022-12-24 NOTE — Telephone Encounter (Signed)
Pt scheduled for virtual colon 01/29/23 at 9am.

## 2023-01-05 ENCOUNTER — Encounter: Payer: Self-pay | Admitting: Hematology and Oncology

## 2023-01-05 ENCOUNTER — Other Ambulatory Visit: Payer: Self-pay | Admitting: *Deleted

## 2023-01-05 DIAGNOSIS — Z170 Estrogen receptor positive status [ER+]: Secondary | ICD-10-CM

## 2023-01-05 DIAGNOSIS — J3081 Allergic rhinitis due to animal (cat) (dog) hair and dander: Secondary | ICD-10-CM | POA: Diagnosis not present

## 2023-01-05 DIAGNOSIS — J3089 Other allergic rhinitis: Secondary | ICD-10-CM | POA: Diagnosis not present

## 2023-01-05 DIAGNOSIS — J301 Allergic rhinitis due to pollen: Secondary | ICD-10-CM | POA: Diagnosis not present

## 2023-01-05 MED ORDER — TAMOXIFEN CITRATE 20 MG PO TABS: ORAL_TABLET | ORAL | 3 refills | Status: DC

## 2023-01-05 NOTE — Telephone Encounter (Signed)
Received MyChart message for refill of Tamoxifen. Refilled Per Dr. Rob Hickman OV note 05/14/22: Continue Tamoxifen for 5 yrs. RTC with [place] in 1 yr. Next appt 05/15/23

## 2023-01-15 ENCOUNTER — Telehealth (INDEPENDENT_AMBULATORY_CARE_PROVIDER_SITE_OTHER): Payer: Medicare Other | Admitting: Family Medicine

## 2023-01-15 DIAGNOSIS — Z0000 Encounter for general adult medical examination without abnormal findings: Secondary | ICD-10-CM
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
## 2023-01-15 NOTE — Patient Instructions (Addendum)
I really enjoyed getting to talk with you today! I am available on Tuesdays and Thursdays for virtual visits if you have any questions or concerns, or if I can be of any further assistance.   CHECKLIST FROM ANNUAL WELLNESS VISIT:  -Follow up (please call to schedule if not scheduled after visit):   -Please schedule follow up in office with Dr. Elease Hashimoto for the Blood pressure and let him know about the cough and shoulder issues as well.    -yearly for annual wellness visit with primary care office  Here is a list of your preventive care/health maintenance measures and the plan for each if any are due:  Health Maintenance  Topic Date Due   DTaP/Tdap/Td (3 - Tdap) can get at pharmacy or the office. If done elsewhere, please bring record so can update.    COVID-19 Vaccine (5 - 2023-24 season) Due yearly   Medicare Annual Wellness (AWV)  Done today   MAMMOGRAM  04/09/2024   COLONOSCOPY (Pts 45-24yr Insurance coverage will need to be confirmed)  09/27/2026   Pneumonia Vaccine 72 Years old  Completed   INFLUENZA VACCINE  Completed   DEXA SCAN  Completed   Zoster Vaccines- Shingrix  Completed   HPV VACCINES  Aged Out   Hepatitis C Screening  Discontinued   Fecal DNA (Cologuard)  Discontinued    -See a dentist at least yearly  -Get your eyes checked and then per your eye specialist's recommendations  -Other issues addressed today:   -I have included below further information regarding a healthy whole foods based diet, physical activity guidelines for adults, stress management and opportunities for social connections. I hope you find this information useful.   -----------------------------------------------------------------------------------------------------------------------------------------------------------------------------------------------------------------------------------------------------------  NUTRITION: -eat real food: lots of colorful vegetables (half the plate) and  fruits -5-7 servings of vegetables and fruits per day (fresh or steamed is best), exp. 2 servings of vegetables with lunch and dinner and 2 servings of fruit per day. Berries and greens such as kale and collards are great choices.  -consume on a regular basis: whole grains (make sure first ingredient on label contains the word "whole"), fresh fruits, fish, nuts, seeds, healthy oils (such as olive oil, avocado oil, grape seed oil) -may eat small amounts of dairy and lean meat on occasion, but avoid processed meats such as ham, bacon, lunch meat, etc. -drink water -try to avoid fast food and pre-packaged foods, processed meat -most experts advise limiting sodium to < 23064mper day, should limit further is any chronic conditions such as high blood pressure, heart disease, diabetes, etc. The American Heart Association advised that < 150018ms is ideal -try to avoid foods that contain any ingredients with names you do not recognize  -try to avoid sugar/sweets (except for the natural sugar that occurs in fresh fruit) -try to avoid sweet drinks -try to avoid white rice, white bread, pasta (unless whole grain), white or yellow potatoes  EXERCISE GUIDELINES FOR ADULTS: -if you wish to increase your physical activity, do so gradually and with the approval of your doctor -STOP and seek medical care immediately if you have any chest pain, chest discomfort or trouble breathing when starting or increasing exercise  -move and stretch your body, legs, feet and arms when sitting for long periods -Physical activity guidelines for optimal health in adults: -least 150 minutes per week of aerobic exercise (can talk, but not sing) once approved by your doctor, 20-30 minutes of sustained activity or two 10 minute episodes of sustained  activity every day.  -resistance training at least 2 days per week if approved by your doctor -balance exercises 3+ days per week:   Stand somewhere where you have something sturdy to  hold onto if you lose balance.    1) lift up on toes, start with 5x per day and work up to 20x   2) stand and lift on leg straight out to the side so that foot is a few inches of the floor, start with 5x each side and work up to 20x each side   3) stand on one foot, start with 5 seconds each side and work up to 20 seconds on each side  If you need ideas or help with getting more active:  -Silver sneakers https://tools.silversneakers.com  -Walk with a Doc: http://stephens-thompson.biz/  -try to include resistance (weight lifting/strength building) and balance exercises twice per week: or the following link for ideas: ChessContest.fr  UpdateClothing.com.cy  STRESS MANAGEMENT: -can try meditating, or just sitting quietly with deep breathing while intentionally relaxing all parts of your body for 5 minutes daily -if you need further help with stress, anxiety or depression please follow up with your primary doctor or contact the wonderful folks at [facility]: [place]: -options in [place] if you wish to engage in more social and exercise related activities:  -Silver sneakers https://tools.silversneakers.com  -Walk with a Doc: http://stephens-thompson.biz/  -Check out the [place] 50+ section on the [place] [employer] (hiking clubs, book clubs, cards and games, chess, exercise classes, aquatic classes and much more) - see the website for details: https://www.[place].gov/departments/parks-recreation/active-adults50  -YouTube has lots of exercise videos for different ages and abilities as well  -[facility] (a variety of indoor and outdoor inperson activities for adults). [phone]. [address].  -Virtual Online Classes (a variety of topics): see seniorplanet.org or call (443)429-8989  -consider volunteering  at a school, hospice center, church, senior center or elsewhere

## 2023-01-16 ENCOUNTER — Encounter: Payer: Self-pay | Admitting: Family Medicine

## 2023-01-16 DIAGNOSIS — M25512 Pain in left shoulder: Secondary | ICD-10-CM | POA: Diagnosis not present

## 2023-01-19 DIAGNOSIS — J3089 Other allergic rhinitis: Secondary | ICD-10-CM | POA: Diagnosis not present

## 2023-01-19 DIAGNOSIS — J301 Allergic rhinitis due to pollen: Secondary | ICD-10-CM | POA: Diagnosis not present

## 2023-01-19 DIAGNOSIS — J3081 Allergic rhinitis due to animal (cat) (dog) hair and dander: Secondary | ICD-10-CM | POA: Diagnosis not present

## 2023-01-29 ENCOUNTER — Ambulatory Visit
Admission: RE | Admit: 2023-01-29 | Discharge: 2023-01-29 | Disposition: A | Discharge status: Home / Self Care | Payer: Medicare Other | Source: Ambulatory Visit | Attending: Gastroenterology | Admitting: Gastroenterology

## 2023-01-29 DIAGNOSIS — Z539 Procedure and treatment not carried out, unspecified reason: Secondary | ICD-10-CM

## 2023-01-29 DIAGNOSIS — K5730 Diverticulosis of large intestine without perforation or abscess without bleeding: Secondary | ICD-10-CM | POA: Diagnosis not present

## 2023-02-01 NOTE — Progress Notes (Signed)
Ms. Fazzone,  Your virtual colonoscopy did not show any large polyps or other concerning findings.  You do have large uterine fibroids.  I recommend you consider repeating a virtual colonoscopy in 5 years based on your health at that time.  After age 74, colon cancer screening is performed on a case-by-case basis and is not universally recommended.

## 2023-02-02 DIAGNOSIS — T63421D Toxic effect of venom of ants, accidental (unintentional), subsequent encounter: Secondary | ICD-10-CM | POA: Diagnosis not present

## 2023-02-02 DIAGNOSIS — J301 Allergic rhinitis due to pollen: Secondary | ICD-10-CM | POA: Diagnosis not present

## 2023-02-02 DIAGNOSIS — J3081 Allergic rhinitis due to animal (cat) (dog) hair and dander: Secondary | ICD-10-CM | POA: Diagnosis not present

## 2023-02-03 DIAGNOSIS — L821 Other seborrheic keratosis: Secondary | ICD-10-CM | POA: Diagnosis not present

## 2023-02-03 DIAGNOSIS — D1801 Hemangioma of skin and subcutaneous tissue: Secondary | ICD-10-CM | POA: Diagnosis not present

## 2023-02-03 DIAGNOSIS — D2222 Melanocytic nevi of left ear and external auricular canal: Secondary | ICD-10-CM | POA: Diagnosis not present

## 2023-02-03 DIAGNOSIS — L814 Other melanin hyperpigmentation: Secondary | ICD-10-CM | POA: Diagnosis not present

## 2023-02-03 DIAGNOSIS — D225 Melanocytic nevi of trunk: Secondary | ICD-10-CM | POA: Diagnosis not present

## 2023-02-03 DIAGNOSIS — B351 Tinea unguium: Secondary | ICD-10-CM | POA: Diagnosis not present

## 2023-02-03 DIAGNOSIS — Z85820 Personal history of malignant melanoma of skin: Secondary | ICD-10-CM | POA: Diagnosis not present

## 2023-02-09 ENCOUNTER — Ambulatory Visit (INDEPENDENT_AMBULATORY_CARE_PROVIDER_SITE_OTHER): Payer: Medicare Other | Admitting: Family Medicine

## 2023-02-09 ENCOUNTER — Other Ambulatory Visit: Payer: Self-pay | Admitting: Adult Health

## 2023-02-09 ENCOUNTER — Encounter: Payer: Self-pay | Admitting: Family Medicine

## 2023-02-09 VITALS — BP 110/68 | HR 52 | Temp 97.9°F | Wt 152.8 lb

## 2023-02-09 DIAGNOSIS — I10 Essential (primary) hypertension: Secondary | ICD-10-CM | POA: Diagnosis not present

## 2023-02-09 MED ORDER — AMLODIPINE BESYLATE 10 MG PO TABS: 10.0000 mg | ORAL_TABLET | Freq: Every day | ORAL | 0 refills | Status: DC

## 2023-02-09 NOTE — Progress Notes (Signed)
   Subjective:    Patient ID: RANESHIA ESTELL, female    DOB: 03/23/1951, 72 y.o.   MRN: [id number]  HPI Here to follow up on HTN. She feels well. When she saw Dr. Elease Hashimoto in January, he added HCTZ 12.5 mg daily to he Amlodipine 5 mg daily. This has not made much difference for her BP. At home she still gets systolic readings in the 0000000, with diastolics in the 0000000.    Review of Systems  Constitutional: Negative.   Respiratory: Negative.    Cardiovascular: Negative.   Neurological: Negative.        Objective:   Physical Exam Constitutional:      Appearance: Normal appearance.  Cardiovascular:     Rate and Rhythm: Normal rate and regular rhythm.     Pulses: Normal pulses.     Heart sounds: Normal heart sounds.  Pulmonary:     Effort: Pulmonary effort is normal.     Breath sounds: Normal breath sounds.  Musculoskeletal:     Right lower leg: No edema.     Left lower leg: No edema.  Neurological:     Mental Status: She is alert.           Assessment & Plan:  HTN. We will stop the HCTZ and we will increase the Amlodipine to 10 mg daily. Recheck in one month.  Alysia Penna, MD

## 2023-02-10 ENCOUNTER — Encounter: Payer: Self-pay | Admitting: Oncology

## 2023-02-16 DIAGNOSIS — J3081 Allergic rhinitis due to animal (cat) (dog) hair and dander: Secondary | ICD-10-CM | POA: Diagnosis not present

## 2023-02-16 DIAGNOSIS — J3089 Other allergic rhinitis: Secondary | ICD-10-CM | POA: Diagnosis not present

## 2023-02-16 DIAGNOSIS — J301 Allergic rhinitis due to pollen: Secondary | ICD-10-CM | POA: Diagnosis not present

## 2023-02-23 DIAGNOSIS — J3081 Allergic rhinitis due to animal (cat) (dog) hair and dander: Secondary | ICD-10-CM | POA: Diagnosis not present

## 2023-02-23 DIAGNOSIS — J3089 Other allergic rhinitis: Secondary | ICD-10-CM | POA: Diagnosis not present

## 2023-02-23 DIAGNOSIS — J301 Allergic rhinitis due to pollen: Secondary | ICD-10-CM | POA: Diagnosis not present

## 2023-03-02 DIAGNOSIS — J3089 Other allergic rhinitis: Secondary | ICD-10-CM | POA: Diagnosis not present

## 2023-03-02 DIAGNOSIS — J3081 Allergic rhinitis due to animal (cat) (dog) hair and dander: Secondary | ICD-10-CM | POA: Diagnosis not present

## 2023-03-02 DIAGNOSIS — J301 Allergic rhinitis due to pollen: Secondary | ICD-10-CM | POA: Diagnosis not present

## 2023-03-16 DIAGNOSIS — J3089 Other allergic rhinitis: Secondary | ICD-10-CM | POA: Diagnosis not present

## 2023-03-16 DIAGNOSIS — J301 Allergic rhinitis due to pollen: Secondary | ICD-10-CM | POA: Diagnosis not present

## 2023-03-16 DIAGNOSIS — J3081 Allergic rhinitis due to animal (cat) (dog) hair and dander: Secondary | ICD-10-CM | POA: Diagnosis not present

## 2023-03-23 DIAGNOSIS — J3089 Other allergic rhinitis: Secondary | ICD-10-CM | POA: Diagnosis not present

## 2023-03-23 DIAGNOSIS — J301 Allergic rhinitis due to pollen: Secondary | ICD-10-CM | POA: Diagnosis not present

## 2023-03-23 DIAGNOSIS — J3081 Allergic rhinitis due to animal (cat) (dog) hair and dander: Secondary | ICD-10-CM | POA: Diagnosis not present

## 2023-03-30 DIAGNOSIS — J3081 Allergic rhinitis due to animal (cat) (dog) hair and dander: Secondary | ICD-10-CM | POA: Diagnosis not present

## 2023-03-30 DIAGNOSIS — J3089 Other allergic rhinitis: Secondary | ICD-10-CM | POA: Diagnosis not present

## 2023-03-30 DIAGNOSIS — J301 Allergic rhinitis due to pollen: Secondary | ICD-10-CM | POA: Diagnosis not present

## 2023-04-01 ENCOUNTER — Other Ambulatory Visit: Payer: Self-pay | Admitting: Hematology and Oncology

## 2023-04-03 DIAGNOSIS — J3081 Allergic rhinitis due to animal (cat) (dog) hair and dander: Secondary | ICD-10-CM | POA: Diagnosis not present

## 2023-04-03 DIAGNOSIS — J3089 Other allergic rhinitis: Secondary | ICD-10-CM | POA: Diagnosis not present

## 2023-04-03 DIAGNOSIS — R040 Epistaxis: Secondary | ICD-10-CM | POA: Diagnosis not present

## 2023-04-03 DIAGNOSIS — J301 Allergic rhinitis due to pollen: Secondary | ICD-10-CM | POA: Diagnosis not present

## 2023-04-06 ENCOUNTER — Other Ambulatory Visit: Payer: Self-pay | Admitting: Family Medicine

## 2023-04-06 DIAGNOSIS — Z1231 Encounter for screening mammogram for malignant neoplasm of breast: Secondary | ICD-10-CM

## 2023-04-13 DIAGNOSIS — J301 Allergic rhinitis due to pollen: Secondary | ICD-10-CM | POA: Diagnosis not present

## 2023-04-13 DIAGNOSIS — J3089 Other allergic rhinitis: Secondary | ICD-10-CM | POA: Diagnosis not present

## 2023-04-13 DIAGNOSIS — J3081 Allergic rhinitis due to animal (cat) (dog) hair and dander: Secondary | ICD-10-CM | POA: Diagnosis not present

## 2023-04-22 ENCOUNTER — Ambulatory Visit: Payer: Medicare Other

## 2023-04-27 DIAGNOSIS — J301 Allergic rhinitis due to pollen: Secondary | ICD-10-CM | POA: Diagnosis not present

## 2023-04-27 DIAGNOSIS — J3081 Allergic rhinitis due to animal (cat) (dog) hair and dander: Secondary | ICD-10-CM | POA: Diagnosis not present

## 2023-04-27 DIAGNOSIS — J3089 Other allergic rhinitis: Secondary | ICD-10-CM | POA: Diagnosis not present

## 2023-05-04 DIAGNOSIS — J3089 Other allergic rhinitis: Secondary | ICD-10-CM | POA: Diagnosis not present

## 2023-05-04 DIAGNOSIS — J3081 Allergic rhinitis due to animal (cat) (dog) hair and dander: Secondary | ICD-10-CM | POA: Diagnosis not present

## 2023-05-04 DIAGNOSIS — J301 Allergic rhinitis due to pollen: Secondary | ICD-10-CM | POA: Diagnosis not present

## 2023-05-06 ENCOUNTER — Ambulatory Visit: Payer: Medicare Other

## 2023-05-07 ENCOUNTER — Other Ambulatory Visit: Payer: Self-pay | Admitting: Family Medicine

## 2023-05-07 ENCOUNTER — Ambulatory Visit: Payer: Medicare Other

## 2023-05-08 ENCOUNTER — Ambulatory Visit
Admission: RE | Admit: 2023-05-08 | Discharge: 2023-05-08 | Disposition: A | Discharge status: Home / Self Care | Payer: Medicare Other | Source: Ambulatory Visit | Attending: Family Medicine | Admitting: Family Medicine

## 2023-05-08 DIAGNOSIS — Z1231 Encounter for screening mammogram for malignant neoplasm of breast: Secondary | ICD-10-CM

## 2023-05-11 DIAGNOSIS — J301 Allergic rhinitis due to pollen: Secondary | ICD-10-CM | POA: Diagnosis not present

## 2023-05-11 DIAGNOSIS — J3089 Other allergic rhinitis: Secondary | ICD-10-CM | POA: Diagnosis not present

## 2023-05-11 DIAGNOSIS — J3081 Allergic rhinitis due to animal (cat) (dog) hair and dander: Secondary | ICD-10-CM | POA: Diagnosis not present

## 2023-05-15 ENCOUNTER — Inpatient Hospital Stay: Payer: Medicare Other | Attending: Hematology and Oncology | Admitting: Hematology and Oncology

## 2023-05-15 ENCOUNTER — Other Ambulatory Visit: Payer: Self-pay

## 2023-05-15 VITALS — BP 136/62 | HR 61 | Temp 97.3°F | Resp 18 | Wt 154.6 lb

## 2023-05-15 DIAGNOSIS — R232 Flushing: Secondary | ICD-10-CM | POA: Diagnosis not present

## 2023-05-15 DIAGNOSIS — Z79899 Other long term (current) drug therapy: Secondary | ICD-10-CM | POA: Diagnosis not present

## 2023-05-15 DIAGNOSIS — Z79810 Long term (current) use of selective estrogen receptor modulators (SERMs): Secondary | ICD-10-CM | POA: Insufficient documentation

## 2023-05-15 DIAGNOSIS — Z170 Estrogen receptor positive status [ER+]: Secondary | ICD-10-CM | POA: Insufficient documentation

## 2023-05-15 DIAGNOSIS — C50411 Malignant neoplasm of upper-outer quadrant of right female breast: Secondary | ICD-10-CM | POA: Insufficient documentation

## 2023-05-15 NOTE — Progress Notes (Signed)
[facility]  [phone] [phone]     ID: [name] DOB: 1950-12-28  MR#: [id number]  VOZ#:[id number]  Patient Care Team: Kristian Covey, MD as PCP - General Irena Cords, Enzo Montgomery, MD as Consulting Physician (Allergy and Immunology) Jill Side, OD as Referring Physician Harriette Bouillon, MD as Consulting Physician (General Surgery) [name], Valentino Hue, MD (Inactive) as Consulting Physician (Oncology) Lonie Peak, MD as Attending Physician (Radiation Oncology) Bufford Buttner, MD as Referring Physician (Dermatology) Laurey Morale, MD as Consulting Physician (Cardiology) Rachel Moulds, MD OTHER MD:   CHIEF COMPLAINT: 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 She began tamoxifen on 07/31/2020.   She has been taking gabapentin 300 mg for hot flashes and this has helped her sleep. Since her last visit, she had mammogram in June 2024, no mammographic evidence of malignancy. No other breast changes. No interim hospitalization. NO recent antibiotics. She is doing very well overall. Rest of the pertinent 10 point ROS reviewed and neg.   COVID 19 VACCINATION STATUS: Status post Pfizer x2+ booster in October 2021   HISTORY OF CURRENT ILLNESS: From the original intake note:   Theresa Bradley was my patient a little over 11 years ago when she underwent lumpectomy on 05/15/2008 for ductal carcinoma in situ and received radiation therapy under Dr. Dayton Scrape. 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  More recently, she presented to her PCP with a small palpable right breast lump around 11 o'clock, just superior and lateral to the nipple. She underwent bilateral diagnostic mammography with tomography and  right breast ultrasonography at [facility] on 12/26/2019 showing: breast density category B; 6 mm superficial mass in the right breast at 11 o'clock in the retroareolar region; no enlarged adenopathy in right axilla.  Accordingly on 12/30/2019 she proceeded to biopsy of the right breast area in question. The pathology from this procedure ([id number]) showed: invasive ductal carcinoma, grade 2. Prognostic indicators significant for: estrogen receptor, 95% positive and progesterone receptor, 80% positive, both with strong staining intensity. Proliferation marker Ki67 at 20%. HER2 positive by immunohistochemistry (3+).  The patient's subsequent history is as detailed below.   PAST MEDICAL HISTORY: Past Medical History:  Diagnosis Date   Anxiety    Breast cancer (HCC) 2009   right   Cancer (HCC)    COUGH, CHRONIC 05/10/2010   Dysrhythmia    occationally "skips a beat"   Family history of brain cancer    Family history of breast cancer    Family history of lung cancer    Family history of multiple myeloma    Family history of skin cancer    GERD (gastroesophageal reflux disease)    HYPERTENSION 04/16/2009   HYPOTHYROIDISM 04/16/2009   OSTEOARTHRITIS 04/16/2009   Personal history of radiation therapy    SENILE LENTIGO 06/10/2010    PAST SURGICAL HISTORY: Past Surgical History:  Procedure Laterality Date   BREAST EXCISIONAL BIOPSY Right    BREAST EXCISIONAL BIOPSY Left    BREAST LUMPECTOMY Right 2009   radiation only   BREAST LUMPECTOMY WITH RADIOACTIVE SEED AND SENTINEL LYMPH NODE BIOPSY Right 01/31/2020   Procedure: RIGHT BREAST LUMPECTOMY WITH RADIOACTIVE SEED AND SENTINEL LYMPH NODE BIOPSY;  Surgeon: Harriette Bouillon, MD;  Location:  [facility];  Service: General;  Laterality: Right;  PEC BLOCK   BREAST SURGERY  2009   X 2, cancer stage 0, lumpectomy   CESAREAN SECTION     1979, 1982   CHOLECYSTECTOMY     COLONOSCOPY     DILATATION & CURETTAGE/HYSTEROSCOPY WITH  MYOSURE  11/2015   HERNIA REPAIR  2009   JOINT REPLACEMENT  2008   hip   MASTECTOMY Right 2021   OPEN SURGICAL REPAIR OF GLUTEAL TENDON Left 07/18/2019   Procedure: Left hip bearing surface revision with gluteal tendon repair;  Surgeon: Ollen Gross, MD;  Location: [facility];  Service: Orthopedics;  Laterality: Left;  2 hrs   PORTACATH PLACEMENT N/A 03/15/2020   Procedure: INSERTION PORT-A-CATH WITH ULTRASOUND GUIDANCE;  Surgeon: Harriette Bouillon, MD;  Location: [facility];  Service: General;  Laterality: N/A;   SIMPLE MASTECTOMY WITH AXILLARY SENTINEL NODE BIOPSY Right 03/15/2020   Procedure: RIGHT SIMPLE MASTECTOMY;  Surgeon: Harriette Bouillon, MD;  Location: [facility];  Service: General;  Laterality: Right;    FAMILY HISTORY: Family History  Problem Relation Age of Onset   Skin cancer Mother    Hyperlipidemia Father    Dementia Father    Skin cancer Father    Heart attack Father    Diabetes Brother    Hypertension Brother    Breast cancer Maternal Aunt 27   Breast cancer Maternal Aunt        dx. mid-70s   Cancer Paternal Aunt        unknown type, dx. >50   Lung cancer Paternal Uncle    Multiple myeloma Paternal Uncle    Brain cancer Cousin        dx. 58s, paternal cousin   Arthritis Other    Hypertension Other    Cancer Other        2 aunts   Colon cancer Neg Hx    Esophageal cancer Neg Hx    Pancreatic cancer Neg Hx    Stomach cancer Neg Hx   The patient's father is 68 years old and the patient's mother is 47 years old as of February 2021.  The patient has one brother, no sisters.  A maternal aunt was diagnosed with breast cancer at age 42.  A paternal uncle was diagnosed with lung cancer in his 52s and another paternal uncle with a "blood cancer" in his late 62s.  There is no history of ovarian pancreatic or prostate cancer in the family to her knowledge.   GYNECOLOGIC HISTORY:  No LMP recorded. Patient is postmenopausal. Menarche: 72 years  old Age at first live birth: 72 years old GX P 2 LMP HRT no  Hysterectomy?  No BSO?  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  Her husband Deniece Portela runs an environmental company that for example checks for mold and then remediates.  Son Gerlene Burdock, 60 years old, lives in [place] and works as a Product/process development scientist.  He has 2 sons.  The patient's son Susy Frizzle 80 lives in [place] and works in Data processing manager.  He married October 2021.  The patient is Episcopalian    ADVANCED DIRECTIVES: In the absence of any documentation to the contrary, the patient's spouse is their HCPOA.    HEALTH MAINTENANCE: Social History   Tobacco Use   Smoking status: Never   Smokeless tobacco: Never   Tobacco comments:  father smoked when she was younger   Vaping Use   Vaping Use: Never used  Substance Use Topics   Alcohol use: Yes    Comment: occasionally    Drug use: No     Colonoscopy: 03/2009/[place]  PAP: 10/2013, negative  Bone density: 11/2015, T score -1.7   Allergies  Allergen Reactions   Codeine Sulfate Nausea And Vomiting   Erythromycin Base Other (See Comments)    Stomach ache   Esomeprazole Magnesium     REACTION: GI upset    Current Outpatient Medications  Medication Sig Dispense Refill   ALPRAZolam (XANAX) 0.25 MG tablet Take one half tablet at bedtime as needed for insomnia. 45 tablet 0   amLODipine (NORVASC) 10 MG tablet TAKE 1 TABLET BY MOUTH EVERY DAY 90 tablet 0   budesonide-formoterol (SYMBICORT) 80-4.5 MCG/ACT inhaler Inhale 1 puff into the lungs daily in the afternoon.     CALCIUM-VITAMIN D PO Take 2 tablets by mouth daily.     citalopram (CELEXA) 20 MG tablet Take 1 tablet (20 mg total) by mouth daily. 90 tablet 3   EPIPEN 2-PAK 0.3 MG/0.3ML SOAJ injection Inject 0.3 mg into the muscle as needed for anaphylaxis.      gabapentin (NEURONTIN) 300 MG capsule TAKE 1  CAPSULE BY MOUTH EVERYDAY AT BEDTIME 90 capsule 4   levothyroxine (SYNTHROID) 50 MCG tablet Take 1 tablet (50 mcg total) by mouth daily. 90 tablet 3   meloxicam (MOBIC) 15 MG tablet Take 1 tablet (15 mg total) by mouth daily. 90 tablet 3   polyethylene glycol (MIRALAX / GLYCOLAX) 17 g packet Take 17 g by mouth every other day.     tamoxifen (NOLVADEX) 20 MG tablet TAKE 1 TABLET BY MOUTH EVERY DAY, START 07/31/20 90 tablet 3   tretinoin (RETIN-A) 0.025 % cream Apply topically at bedtime. 45 g 6   No current facility-administered medications for this visit.    OBJECTIVE: White woman in no acute distress  There were no vitals filed for this visit.    There is no height or weight on file to calculate BMI.   Wt Readings from Last 3 Encounters:  02/09/23 152 lb 12.8 oz (69.3 kg)  11/26/22 159 lb 9.6 oz (72.4 kg)  05/14/22 156 lb 11.2 oz (71.1 kg)     ECOG FS:1 - Symptomatic but completely ambulatory  Physical Exam Constitutional:      General: She is not in acute distress.    Appearance: Normal appearance.  Cardiovascular:     Rate and Rhythm: Normal rate and regular rhythm.     Pulses: Normal pulses.     Heart sounds: Normal heart sounds.  Chest:     Comments: Right mastectomy scar with no recurrence. Left breast normal to inspection and palpation. No regional adenopathy Musculoskeletal:     Cervical back: Normal range of motion and neck supple. No rigidity.  Lymphadenopathy:     Cervical: No cervical adenopathy.  Skin:    General: Skin is warm and dry.  Neurological:     Mental Status: She is alert.      LAB RESULTS:  CMP     Component Value Date/Time   NA 139 05/14/2022 1240   K 3.6 05/14/2022 1240   CL 104 05/14/2022 1240   CO2 30 05/14/2022 1240   GLUCOSE 155 (H) 05/14/2022 1240   BUN 12 05/14/2022 1240   CREATININE 0.96 05/14/2022 1240   CALCIUM 8.9 05/14/2022 1240   PROT 7.3 05/14/2022 1240  ALBUMIN 4.0 05/14/2022 1240   AST 13 (L) 05/14/2022 1240   ALT 6  05/14/2022 1240   ALKPHOS 43 05/14/2022 1240   BILITOT 0.4 05/14/2022 1240   GFRNONAA >60 05/14/2022 1240   GFRAA >60 08/14/2020 1227   GFRAA >60 01/11/2020 1209    No results found for: "TOTALPROTELP", "ALBUMINELP", "A1GS", "A2GS", "BETS", "BETA2SER", "GAMS", "MSPIKE", "SPEI"  Lab Results  Component Value Date   WBC 5.9 05/14/2022   NEUTROABS 3.3 05/14/2022   HGB 13.1 05/14/2022   HCT 39.7 05/14/2022   MCV 91.3 05/14/2022   PLT 299 05/14/2022    No results found for: "LABCA2"  No components found for: "EPPIRJ188"  No results for input(s): "INR" in the last 168 hours.  No results found for: "LABCA2"  No results found for: "CZY606"  No results found for: "CAN125"  No results found for: "CAN153"  No results found for: "CA2729"  No components found for: "HGQUANT"  No results found for: "CEA1", "CEA" / No results found for: "CEA1", "CEA"   No results found for: "AFPTUMOR"  No results found for: "CHROMOGRNA"  No results found for: "KPAFRELGTCHN", "LAMBDASER", "KAPLAMBRATIO" (kappa/lambda light chains)  No results found for: "HGBA", "HGBA2QUANT", "HGBFQUANT", "HGBSQUAN" (Hemoglobinopathy evaluation)   No results found for: "LDH"  No results found for: "IRON", "TIBC", "IRONPCTSAT" (Iron and TIBC)  No results found for: "FERRITIN"  Urinalysis    Component Value Date/Time   COLORURINE yellow 05/24/2010 0855   APPEARANCEUR Clear 05/24/2010 0855   LABSPEC 1.015 05/24/2010 0855   PHURINE 6.0 05/24/2010 0855   GLUCOSEU NEGATIVE 09/22/2007 1110   HGBUR negative 05/24/2010 0855   BILIRUBINUR n 06/29/2019 1533   KETONESUR NEGATIVE 09/22/2007 1110   PROTEINUR Negative 06/29/2019 1533   PROTEINUR NEGATIVE 09/22/2007 1110   UROBILINOGEN 0.2 06/29/2019 1533   UROBILINOGEN 0.2 05/24/2010 0855   NITRITE n 06/29/2019 1533   NITRITE negative 05/24/2010 0855   LEUKOCYTESUR Negative 06/29/2019 1533    STUDIES: MM 3D SCREENING MAMMOGRAM UNILATERAL LEFT  BREAST  Result Date: 05/12/2023 CLINICAL DATA:  Screening. EXAM: DIGITAL SCREENING UNILATERAL LEFT MAMMOGRAM WITH CAD AND TOMOSYNTHESIS TECHNIQUE: Left screening digital craniocaudal and mediolateral oblique mammograms were obtained. Left screening digital breast tomosynthesis was performed. The images were evaluated with computer-aided detection. COMPARISON:  Previous exam(s). ACR Breast Density Category b: There are scattered areas of fibroglandular density. FINDINGS: There are no findings suspicious for malignancy. IMPRESSION: No mammographic evidence of malignancy. A result letter of this screening mammogram will be mailed directly to the patient. RECOMMENDATION: Screening mammogram in one year. (Code:[id number]-B-01Y) BI-RADS CATEGORY  1: Negative. Electronically Signed   By: Edwin Cap M.D.   On: 05/12/2023 13:38     ELIGIBLE FOR AVAILABLE RESEARCH PROTOCOL: no  ASSESSMENT: 72 y.o. [place] woman status post right breast periareolar biopsy 12/30/2019 for a clinical T1a N0, stage I invasive ductal carcinoma, grade 2, estrogen and progesterone receptor positive, HER-2 amplified, with an MIB-one of 20%.  (1) history of prior right lumpectomy June 2009 for ductal carcinoma in situ  (a) status post adjuvant radiation  (b) did not receive antiestrogens  She then had right breast melanoma in 2020, resected, didn't need adjuvant immunotherapy.  (2) status post repeat right lumpectomy and sentinel lymph node sampling 01/31/2020 for a pT1c pN0, stage IA invasive ductal carcinoma, grade 2, with positive margins  (a) total one sentinel node removed  (3) s/p right mastectomy 03/15/2020 showing residual invasive ductal carcinoma measuring 0.9 cm, but negative margins (added to prior 1.1  lumpectomy, still T1 N0 or stage IA)  (a) one additional axillary node was removed (total 2 right axillary nodes removed)  (3) adjuvant chemotherapy with paclitaxel and trastuzumab weekly x 12 started 04/24/2020,  completing 10 doses 06/26/2020   (a) final 2 doses omitted secondary to peripheral neuropathy  (4) trastuzumab to be continued every 21 days to total one year (through May 2022).  (a) echo 01/17/2020 shows an ejection fraction in the 60-65% range  (b) echo 05/30/2020 shows an ejection fraction of 60-65%.  (c) echo 08/28/2020 shows an ejection fraction in the 55-60% range  (d) echo 12/14/2020 shows an ejection fraction in the 60-65% range.  (e) echo 03/26/2021 shows an EF in the 55% range with decreased GLS  (f) repeat echo 07/26/2021 shows the left ventricular ejection fraction back to 60-65%  (5) started tamoxifen 07/31/2020  (a) Bone density in 11/2015 shows osteopenia with T score of -1.7.  (b) bone density 03/04/2021 shows a T score of -2.0  (6) genetics testing 02/01/2020 through the [facility] STAT panel found no deleterious mutations then ATM, BRCA1, BRCA2, CDH1, CHEK2, PALB2, PTEN, STK11 and TP53.    PLAN:  She is here for a follow up on tamoxifen.  She is tolerating this well, except for nocturnal hot flashes on gabapentin No concerns on exam today or recent physical exam.  Mammogram with no evidence of malignancy Continue Tamoxifen for 5 yrs. She will complete this in Sep 2026. RTC with [place] in 1 yr.  Total encounter time 20  minutes.*   *Total Encounter Time as defined by the Centers for Medicare and Medicaid Services includes, in addition to the face-to-face time of a patient visit (documented in the note above) non-face-to-face time: obtaining and reviewing outside history, ordering and reviewing medications, tests or procedures, care coordination (communications with other health care professionals or caregivers) and documentation in the medical record.

## 2023-05-21 ENCOUNTER — Ambulatory Visit: Payer: Medicare Other

## 2023-05-25 DIAGNOSIS — J301 Allergic rhinitis due to pollen: Secondary | ICD-10-CM | POA: Diagnosis not present

## 2023-05-25 DIAGNOSIS — J3081 Allergic rhinitis due to animal (cat) (dog) hair and dander: Secondary | ICD-10-CM | POA: Diagnosis not present

## 2023-05-25 DIAGNOSIS — J3089 Other allergic rhinitis: Secondary | ICD-10-CM | POA: Diagnosis not present

## 2023-06-08 DIAGNOSIS — J3089 Other allergic rhinitis: Secondary | ICD-10-CM | POA: Diagnosis not present

## 2023-06-08 DIAGNOSIS — J3081 Allergic rhinitis due to animal (cat) (dog) hair and dander: Secondary | ICD-10-CM | POA: Diagnosis not present

## 2023-06-08 DIAGNOSIS — J301 Allergic rhinitis due to pollen: Secondary | ICD-10-CM | POA: Diagnosis not present

## 2023-06-09 ENCOUNTER — Encounter: Payer: Self-pay | Admitting: Family Medicine

## 2023-06-09 MED ORDER — HYDROCHLOROTHIAZIDE 12.5 MG PO CAPS: 12.5000 mg | ORAL_CAPSULE | Freq: Every day | ORAL | 0 refills | Status: DC

## 2023-06-22 DIAGNOSIS — J3081 Allergic rhinitis due to animal (cat) (dog) hair and dander: Secondary | ICD-10-CM | POA: Diagnosis not present

## 2023-06-22 DIAGNOSIS — J301 Allergic rhinitis due to pollen: Secondary | ICD-10-CM | POA: Diagnosis not present

## 2023-06-22 DIAGNOSIS — J3089 Other allergic rhinitis: Secondary | ICD-10-CM | POA: Diagnosis not present

## 2023-06-29 MED ORDER — AMLODIPINE BESYLATE 5 MG PO TABS: 5.0000 mg | ORAL_TABLET | Freq: Every day | ORAL | 0 refills | Status: DC

## 2023-06-29 MED ORDER — ALPRAZOLAM 0.25 MG PO TABS: ORAL_TABLET | ORAL | 0 refills | Status: DC

## 2023-06-29 NOTE — Addendum Note (Signed)
Addended by: Christy Sartorius on: 06/29/2023 04:55 PM   Modules accepted: Orders

## 2023-06-29 NOTE — Addendum Note (Signed)
Addended by: Kristian Covey on: 06/29/2023 07:45 PM   Modules accepted: Orders

## 2023-07-06 DIAGNOSIS — J3089 Other allergic rhinitis: Secondary | ICD-10-CM | POA: Diagnosis not present

## 2023-07-06 DIAGNOSIS — J3081 Allergic rhinitis due to animal (cat) (dog) hair and dander: Secondary | ICD-10-CM | POA: Diagnosis not present

## 2023-07-06 DIAGNOSIS — J301 Allergic rhinitis due to pollen: Secondary | ICD-10-CM | POA: Diagnosis not present

## 2023-07-20 DIAGNOSIS — J3081 Allergic rhinitis due to animal (cat) (dog) hair and dander: Secondary | ICD-10-CM | POA: Diagnosis not present

## 2023-07-20 DIAGNOSIS — J301 Allergic rhinitis due to pollen: Secondary | ICD-10-CM | POA: Diagnosis not present

## 2023-07-20 DIAGNOSIS — J3089 Other allergic rhinitis: Secondary | ICD-10-CM | POA: Diagnosis not present

## 2023-08-04 ENCOUNTER — Other Ambulatory Visit: Payer: Self-pay | Admitting: Family Medicine

## 2023-08-04 DIAGNOSIS — J301 Allergic rhinitis due to pollen: Secondary | ICD-10-CM | POA: Diagnosis not present

## 2023-08-04 DIAGNOSIS — J3089 Other allergic rhinitis: Secondary | ICD-10-CM | POA: Diagnosis not present

## 2023-08-04 DIAGNOSIS — J3081 Allergic rhinitis due to animal (cat) (dog) hair and dander: Secondary | ICD-10-CM | POA: Diagnosis not present

## 2023-08-17 DIAGNOSIS — J3089 Other allergic rhinitis: Secondary | ICD-10-CM | POA: Diagnosis not present

## 2023-08-17 DIAGNOSIS — J3081 Allergic rhinitis due to animal (cat) (dog) hair and dander: Secondary | ICD-10-CM | POA: Diagnosis not present

## 2023-08-17 DIAGNOSIS — J301 Allergic rhinitis due to pollen: Secondary | ICD-10-CM | POA: Diagnosis not present

## 2023-09-01 DIAGNOSIS — J3081 Allergic rhinitis due to animal (cat) (dog) hair and dander: Secondary | ICD-10-CM | POA: Diagnosis not present

## 2023-09-01 DIAGNOSIS — J3089 Other allergic rhinitis: Secondary | ICD-10-CM | POA: Diagnosis not present

## 2023-09-01 DIAGNOSIS — J301 Allergic rhinitis due to pollen: Secondary | ICD-10-CM | POA: Diagnosis not present

## 2023-09-07 ENCOUNTER — Other Ambulatory Visit: Payer: Self-pay | Admitting: Family Medicine

## 2023-09-14 DIAGNOSIS — J301 Allergic rhinitis due to pollen: Secondary | ICD-10-CM | POA: Diagnosis not present

## 2023-09-14 DIAGNOSIS — J3089 Other allergic rhinitis: Secondary | ICD-10-CM | POA: Diagnosis not present

## 2023-09-14 DIAGNOSIS — J3081 Allergic rhinitis due to animal (cat) (dog) hair and dander: Secondary | ICD-10-CM | POA: Diagnosis not present

## 2023-09-15 ENCOUNTER — Ambulatory Visit (INDEPENDENT_AMBULATORY_CARE_PROVIDER_SITE_OTHER): Payer: Medicare Other | Admitting: Family Medicine

## 2023-09-15 ENCOUNTER — Encounter: Payer: Self-pay | Admitting: Family Medicine

## 2023-09-15 VITALS — BP 116/70 | HR 56 | Temp 98.2°F | Ht 64.0 in | Wt 154.1 lb

## 2023-09-15 DIAGNOSIS — Z23 Encounter for immunization: Secondary | ICD-10-CM | POA: Diagnosis not present

## 2023-09-15 DIAGNOSIS — E039 Hypothyroidism, unspecified: Secondary | ICD-10-CM

## 2023-09-15 DIAGNOSIS — I10 Essential (primary) hypertension: Secondary | ICD-10-CM

## 2023-09-15 DIAGNOSIS — M25532 Pain in left wrist: Secondary | ICD-10-CM | POA: Diagnosis not present

## 2023-09-15 NOTE — Addendum Note (Signed)
Addended by: Christy Sartorius on: 09/15/2023 10:44 AM   Modules accepted: Orders

## 2023-09-15 NOTE — Patient Instructions (Signed)
Set up labs for sometime in early December.

## 2023-09-15 NOTE — Progress Notes (Signed)
Established Patient Office Visit  Subjective   Patient ID: Theresa Bradley, female    DOB: Apr 03, 1951  Age: 72 y.o. MRN: [id number]  Chief Complaint  Patient presents with   Hypertension    HPI   Theresa Bradley is seen today for medical follow-up.  She has history of hypertension, hypothyroidism, osteoarthritis, history of melanoma, history of breast cancer.  She takes HCTZ 12.5 mg and amlodipine 5 mg daily for hypertension.  Blood pressures have generally been well-controlled.  No recent dizziness.  Compliant with medications.  She takes levothyroxine 50 mcg daily for hypothyroidism.  Last TSH was last January.  She is due for electrolytes at this time. No recent chest pains.  No peripheral edema.  Stays busy caring for her 56 year old mother.  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  Does need flu vaccine.  She has had some recent left wrist pains.  Right hand dominant.  Went to [employer].  Had x-rays that showed some osteoarthritis.  Not felt to be carpal tunnel related.  No weakness.  Pain is mostly wrist region. Denies any radiation of pain into the 1st through 3rd digits.  Past Medical History:  Diagnosis Date   Anxiety    Breast cancer (HCC) 2009   right   Cancer (HCC)    COUGH, CHRONIC 05/10/2010   Dysrhythmia    occationally "skips a beat"   Family history of brain cancer    Family history of breast cancer    Family history of lung cancer    Family history of multiple myeloma    Family history of skin cancer    GERD (gastroesophageal reflux disease)    HYPERTENSION 04/16/2009   HYPOTHYROIDISM 04/16/2009   OSTEOARTHRITIS 04/16/2009   Personal history of radiation therapy    SENILE LENTIGO 06/10/2010   Past Surgical History:  Procedure Laterality Date   BREAST EXCISIONAL BIOPSY Right    BREAST EXCISIONAL BIOPSY Left    BREAST LUMPECTOMY Right 2009   radiation only   BREAST LUMPECTOMY WITH RADIOACTIVE SEED AND SENTINEL LYMPH NODE BIOPSY Right 01/31/2020   Procedure:  RIGHT BREAST LUMPECTOMY WITH RADIOACTIVE SEED AND SENTINEL LYMPH NODE BIOPSY;  Surgeon: Harriette Bouillon, MD;  Location: [facility];  Service: General;  Laterality: Right;  PEC BLOCK   BREAST SURGERY  2009   X 2, cancer stage 0, lumpectomy   CESAREAN SECTION     1979, 1982   CHOLECYSTECTOMY     COLONOSCOPY     DILATATION & CURETTAGE/HYSTEROSCOPY WITH MYOSURE  11/2015   HERNIA REPAIR  2009   JOINT REPLACEMENT  2008   hip   MASTECTOMY Right 2021   OPEN SURGICAL REPAIR OF GLUTEAL TENDON Left 07/18/2019   Procedure: Left hip bearing surface revision with gluteal tendon repair;  Surgeon: Ollen Gross, MD;  Location: [facility];  Service: Orthopedics;  Laterality: Left;  2 hrs   PORTACATH PLACEMENT N/A 03/15/2020   Procedure: INSERTION PORT-A-CATH WITH ULTRASOUND GUIDANCE;  Surgeon: Harriette Bouillon, MD;  Location: [facility];  Service: General;  Laterality: N/A;   SIMPLE MASTECTOMY WITH AXILLARY SENTINEL NODE BIOPSY Right 03/15/2020   Procedure: RIGHT SIMPLE MASTECTOMY;  Surgeon: Harriette Bouillon, MD;  Location:  SURGERY CENTER;  Service: General;  Laterality: Right;    reports that she has never smoked. She has never used smokeless tobacco. She reports current alcohol use. She reports that she does not use drugs. family history includes Arthritis in an other family member; Brain cancer in  her cousin; Breast cancer in her maternal aunt; Breast cancer (age of onset: 4) in her maternal aunt; Cancer in her paternal aunt and another family member; Dementia in her father; Diabetes in her brother; Heart attack in her father; Hyperlipidemia in her father; Hypertension in her brother and another family member; Lung cancer in her paternal uncle; Multiple myeloma in her paternal uncle; Skin cancer in her father and mother. Allergies  Allergen Reactions   Codeine Sulfate Nausea And Vomiting   Erythromycin Base Other (See Comments)    Stomach ache   Esomeprazole Magnesium      REACTION: GI upset    Review of Systems  Constitutional:  Negative for malaise/fatigue.  Eyes:  Negative for blurred vision.  Respiratory:  Negative for shortness of breath.   Cardiovascular:  Negative for chest pain.  Neurological:  Negative for dizziness, weakness and headaches.      Objective:     BP 116/70 (BP Location: Left Arm, Patient Position: Sitting, Cuff Size: Normal)   Pulse (!) 56   Temp 98.2 F (36.8 C) (Oral)   Ht 5\' 4"  (1.626 m)   Wt 154 lb 1.6 oz (69.9 kg)   SpO2 96%   BMI 26.45 kg/m  BP Readings from Last 3 Encounters:  09/15/23 116/70  05/15/23 136/62  02/09/23 110/68   Wt Readings from Last 3 Encounters:  09/15/23 154 lb 1.6 oz (69.9 kg)  05/15/23 154 lb 9.6 oz (70.1 kg)  02/09/23 152 lb 12.8 oz (69.3 kg)      Physical Exam Constitutional:      Appearance: She is well-developed.  Eyes:     Pupils: Pupils are equal, round, and reactive to light.  Neck:     Thyroid: No thyromegaly.     Vascular: No JVD.  Cardiovascular:     Rate and Rhythm: Normal rate and regular rhythm.     Heart sounds:     No gallop.  Pulmonary:     Effort: Pulmonary effort is normal. No respiratory distress.     Breath sounds: Normal breath sounds. No wheezing or rales.  Musculoskeletal:     Cervical back: Neck supple.     Comments: Left wrist reveals good range of motion.  No edema.  No erythema or warmth.  Neurological:     Mental Status: She is alert.      No results found for any visits on 09/15/23.    The 10-year ASCVD risk score (Arnett DK, et al., 2019) is: 16%    Assessment & Plan:   #1 hypertension.  Well-controlled.  Continue HCTZ 12.5 mg daily and amlodipine 5 mg daily.  Future lab order placed for basic metabolic panel.  #2 hypothyroidism.  Patient on levothyroxine.  Future lab order placed for TSH as our lab was closed today.  She will return in a month or so for that  #3 left wrist pain.  Suspect osteoarthritis.  Bradley clinical suspicion  for carpal tunnel.  Continue meloxicam as needed  #4 health maintenance-requesting flu vaccine.  This is given today.  No follow-ups on file.    Evelena Peat, MD

## 2023-09-24 ENCOUNTER — Other Ambulatory Visit: Payer: Self-pay | Admitting: Family Medicine

## 2023-09-28 DIAGNOSIS — J3081 Allergic rhinitis due to animal (cat) (dog) hair and dander: Secondary | ICD-10-CM | POA: Diagnosis not present

## 2023-09-28 DIAGNOSIS — J3089 Other allergic rhinitis: Secondary | ICD-10-CM | POA: Diagnosis not present

## 2023-09-28 DIAGNOSIS — J301 Allergic rhinitis due to pollen: Secondary | ICD-10-CM | POA: Diagnosis not present

## 2023-10-12 DIAGNOSIS — J3089 Other allergic rhinitis: Secondary | ICD-10-CM | POA: Diagnosis not present

## 2023-10-12 DIAGNOSIS — J301 Allergic rhinitis due to pollen: Secondary | ICD-10-CM | POA: Diagnosis not present

## 2023-10-12 DIAGNOSIS — J3081 Allergic rhinitis due to animal (cat) (dog) hair and dander: Secondary | ICD-10-CM | POA: Diagnosis not present

## 2023-10-26 ENCOUNTER — Other Ambulatory Visit (INDEPENDENT_AMBULATORY_CARE_PROVIDER_SITE_OTHER): Payer: Medicare Other

## 2023-10-26 DIAGNOSIS — Z79899 Other long term (current) drug therapy: Secondary | ICD-10-CM

## 2023-10-26 DIAGNOSIS — E039 Hypothyroidism, unspecified: Secondary | ICD-10-CM

## 2023-10-26 DIAGNOSIS — J3081 Allergic rhinitis due to animal (cat) (dog) hair and dander: Secondary | ICD-10-CM | POA: Diagnosis not present

## 2023-10-26 DIAGNOSIS — J3089 Other allergic rhinitis: Secondary | ICD-10-CM | POA: Diagnosis not present

## 2023-10-26 DIAGNOSIS — J301 Allergic rhinitis due to pollen: Secondary | ICD-10-CM | POA: Diagnosis not present

## 2023-10-26 DIAGNOSIS — I10 Essential (primary) hypertension: Secondary | ICD-10-CM

## 2023-10-26 LAB — ELECTROLYTE PANEL
CO2: 32 meq/L (ref 19–32)
Chloride: 96 meq/L (ref 96–112)
Potassium: 3.6 meq/L (ref 3.5–5.1)
Sodium: 136 meq/L (ref 135–145)

## 2023-10-26 LAB — BASIC METABOLIC PANEL
BUN: 15 mg/dL (ref 6–23)
CO2: 32 meq/L (ref 19–32)
Calcium: 9.3 mg/dL (ref 8.4–10.5)
Chloride: 96 meq/L (ref 96–112)
Creatinine, Ser: 1.03 mg/dL (ref 0.40–1.20)
GFR: 53.63 mL/min — ABNORMAL LOW (ref >60.00)
Glucose, Bld: 97 mg/dL (ref 70–99)
Potassium: 3.6 meq/L (ref 3.5–5.1)
Sodium: 136 meq/L (ref 135–145)

## 2023-10-26 LAB — TSH: TSH: 1.56 u[IU]/mL (ref 0.35–5.50)

## 2023-10-27 ENCOUNTER — Encounter: Payer: Self-pay | Admitting: Family Medicine

## 2023-10-27 DIAGNOSIS — R944 Abnormal results of kidney function studies: Secondary | ICD-10-CM

## 2023-11-04 DIAGNOSIS — J301 Allergic rhinitis due to pollen: Secondary | ICD-10-CM | POA: Diagnosis not present

## 2023-11-04 DIAGNOSIS — J3089 Other allergic rhinitis: Secondary | ICD-10-CM | POA: Diagnosis not present

## 2023-11-04 DIAGNOSIS — J3081 Allergic rhinitis due to animal (cat) (dog) hair and dander: Secondary | ICD-10-CM | POA: Diagnosis not present

## 2023-11-09 DIAGNOSIS — J3081 Allergic rhinitis due to animal (cat) (dog) hair and dander: Secondary | ICD-10-CM | POA: Diagnosis not present

## 2023-11-09 DIAGNOSIS — J301 Allergic rhinitis due to pollen: Secondary | ICD-10-CM | POA: Diagnosis not present

## 2023-11-09 DIAGNOSIS — J3089 Other allergic rhinitis: Secondary | ICD-10-CM | POA: Diagnosis not present

## 2023-11-12 ENCOUNTER — Other Ambulatory Visit: Payer: Self-pay | Admitting: Family Medicine

## 2023-11-15 ENCOUNTER — Other Ambulatory Visit: Payer: Self-pay | Admitting: Family Medicine

## 2023-11-15 ENCOUNTER — Other Ambulatory Visit: Payer: Self-pay | Admitting: Hematology and Oncology

## 2023-11-15 DIAGNOSIS — C50411 Malignant neoplasm of upper-outer quadrant of right female breast: Secondary | ICD-10-CM

## 2023-12-04 DIAGNOSIS — J3081 Allergic rhinitis due to animal (cat) (dog) hair and dander: Secondary | ICD-10-CM | POA: Diagnosis not present

## 2023-12-04 DIAGNOSIS — R040 Epistaxis: Secondary | ICD-10-CM | POA: Diagnosis not present

## 2023-12-04 DIAGNOSIS — J3089 Other allergic rhinitis: Secondary | ICD-10-CM | POA: Diagnosis not present

## 2023-12-04 DIAGNOSIS — J45909 Unspecified asthma, uncomplicated: Secondary | ICD-10-CM | POA: Diagnosis not present

## 2023-12-04 DIAGNOSIS — J301 Allergic rhinitis due to pollen: Secondary | ICD-10-CM | POA: Diagnosis not present

## 2023-12-05 ENCOUNTER — Other Ambulatory Visit: Payer: Self-pay | Admitting: Family Medicine

## 2023-12-07 DIAGNOSIS — H35371 Puckering of macula, right eye: Secondary | ICD-10-CM | POA: Diagnosis not present

## 2023-12-07 DIAGNOSIS — H40013 Open angle with borderline findings, low risk, bilateral: Secondary | ICD-10-CM | POA: Diagnosis not present

## 2023-12-07 DIAGNOSIS — H52203 Unspecified astigmatism, bilateral: Secondary | ICD-10-CM | POA: Diagnosis not present

## 2023-12-07 DIAGNOSIS — Z961 Presence of intraocular lens: Secondary | ICD-10-CM | POA: Diagnosis not present

## 2023-12-09 DIAGNOSIS — J3089 Other allergic rhinitis: Secondary | ICD-10-CM | POA: Diagnosis not present

## 2023-12-09 DIAGNOSIS — J3081 Allergic rhinitis due to animal (cat) (dog) hair and dander: Secondary | ICD-10-CM | POA: Diagnosis not present

## 2023-12-09 DIAGNOSIS — J301 Allergic rhinitis due to pollen: Secondary | ICD-10-CM | POA: Diagnosis not present

## 2023-12-15 DIAGNOSIS — J301 Allergic rhinitis due to pollen: Secondary | ICD-10-CM | POA: Diagnosis not present

## 2023-12-15 DIAGNOSIS — J3089 Other allergic rhinitis: Secondary | ICD-10-CM | POA: Diagnosis not present

## 2023-12-15 DIAGNOSIS — J3081 Allergic rhinitis due to animal (cat) (dog) hair and dander: Secondary | ICD-10-CM | POA: Diagnosis not present

## 2023-12-20 ENCOUNTER — Other Ambulatory Visit: Payer: Self-pay | Admitting: Family Medicine

## 2023-12-28 DIAGNOSIS — J301 Allergic rhinitis due to pollen: Secondary | ICD-10-CM | POA: Diagnosis not present

## 2023-12-28 DIAGNOSIS — J3081 Allergic rhinitis due to animal (cat) (dog) hair and dander: Secondary | ICD-10-CM | POA: Diagnosis not present

## 2023-12-28 DIAGNOSIS — J3089 Other allergic rhinitis: Secondary | ICD-10-CM | POA: Diagnosis not present

## 2024-01-04 DIAGNOSIS — J3089 Other allergic rhinitis: Secondary | ICD-10-CM | POA: Diagnosis not present

## 2024-01-04 DIAGNOSIS — J301 Allergic rhinitis due to pollen: Secondary | ICD-10-CM | POA: Diagnosis not present

## 2024-01-04 DIAGNOSIS — J3081 Allergic rhinitis due to animal (cat) (dog) hair and dander: Secondary | ICD-10-CM | POA: Diagnosis not present

## 2024-01-12 DIAGNOSIS — J301 Allergic rhinitis due to pollen: Secondary | ICD-10-CM | POA: Diagnosis not present

## 2024-01-12 DIAGNOSIS — J3081 Allergic rhinitis due to animal (cat) (dog) hair and dander: Secondary | ICD-10-CM | POA: Diagnosis not present

## 2024-01-12 DIAGNOSIS — J3089 Other allergic rhinitis: Secondary | ICD-10-CM | POA: Diagnosis not present

## 2024-01-19 DIAGNOSIS — J3089 Other allergic rhinitis: Secondary | ICD-10-CM | POA: Diagnosis not present

## 2024-01-19 DIAGNOSIS — J3081 Allergic rhinitis due to animal (cat) (dog) hair and dander: Secondary | ICD-10-CM | POA: Diagnosis not present

## 2024-01-19 DIAGNOSIS — J301 Allergic rhinitis due to pollen: Secondary | ICD-10-CM | POA: Diagnosis not present

## 2024-01-26 DIAGNOSIS — J301 Allergic rhinitis due to pollen: Secondary | ICD-10-CM | POA: Diagnosis not present

## 2024-01-26 DIAGNOSIS — J3081 Allergic rhinitis due to animal (cat) (dog) hair and dander: Secondary | ICD-10-CM | POA: Diagnosis not present

## 2024-01-26 DIAGNOSIS — J3089 Other allergic rhinitis: Secondary | ICD-10-CM | POA: Diagnosis not present

## 2024-02-02 DIAGNOSIS — J3081 Allergic rhinitis due to animal (cat) (dog) hair and dander: Secondary | ICD-10-CM | POA: Diagnosis not present

## 2024-02-02 DIAGNOSIS — J301 Allergic rhinitis due to pollen: Secondary | ICD-10-CM | POA: Diagnosis not present

## 2024-02-02 DIAGNOSIS — J3089 Other allergic rhinitis: Secondary | ICD-10-CM | POA: Diagnosis not present

## 2024-02-03 ENCOUNTER — Other Ambulatory Visit: Payer: Self-pay | Admitting: Family Medicine

## 2024-02-06 ENCOUNTER — Other Ambulatory Visit: Payer: Self-pay | Admitting: Family Medicine

## 2024-02-09 ENCOUNTER — Other Ambulatory Visit: Payer: Self-pay | Admitting: Family Medicine

## 2024-02-10 NOTE — Telephone Encounter (Signed)
 Theresa Bradley with [facility] informed of message below

## 2024-02-16 DIAGNOSIS — J3081 Allergic rhinitis due to animal (cat) (dog) hair and dander: Secondary | ICD-10-CM | POA: Diagnosis not present

## 2024-02-16 DIAGNOSIS — J301 Allergic rhinitis due to pollen: Secondary | ICD-10-CM | POA: Diagnosis not present

## 2024-02-16 DIAGNOSIS — J3089 Other allergic rhinitis: Secondary | ICD-10-CM | POA: Diagnosis not present

## 2024-02-26 DIAGNOSIS — J301 Allergic rhinitis due to pollen: Secondary | ICD-10-CM | POA: Diagnosis not present

## 2024-02-26 DIAGNOSIS — J3089 Other allergic rhinitis: Secondary | ICD-10-CM | POA: Diagnosis not present

## 2024-02-26 DIAGNOSIS — J3081 Allergic rhinitis due to animal (cat) (dog) hair and dander: Secondary | ICD-10-CM | POA: Diagnosis not present

## 2024-03-05 ENCOUNTER — Other Ambulatory Visit: Payer: Self-pay | Admitting: Family Medicine

## 2024-03-14 DIAGNOSIS — J3081 Allergic rhinitis due to animal (cat) (dog) hair and dander: Secondary | ICD-10-CM | POA: Diagnosis not present

## 2024-03-14 DIAGNOSIS — J301 Allergic rhinitis due to pollen: Secondary | ICD-10-CM | POA: Diagnosis not present

## 2024-03-14 DIAGNOSIS — J3089 Other allergic rhinitis: Secondary | ICD-10-CM | POA: Diagnosis not present

## 2024-03-15 DIAGNOSIS — D225 Melanocytic nevi of trunk: Secondary | ICD-10-CM | POA: Diagnosis not present

## 2024-03-15 DIAGNOSIS — D2222 Melanocytic nevi of left ear and external auricular canal: Secondary | ICD-10-CM | POA: Diagnosis not present

## 2024-03-15 DIAGNOSIS — L814 Other melanin hyperpigmentation: Secondary | ICD-10-CM | POA: Diagnosis not present

## 2024-03-15 DIAGNOSIS — Z85820 Personal history of malignant melanoma of skin: Secondary | ICD-10-CM | POA: Diagnosis not present

## 2024-03-15 DIAGNOSIS — D1801 Hemangioma of skin and subcutaneous tissue: Secondary | ICD-10-CM | POA: Diagnosis not present

## 2024-03-15 DIAGNOSIS — L821 Other seborrheic keratosis: Secondary | ICD-10-CM | POA: Diagnosis not present

## 2024-03-15 DIAGNOSIS — D224 Melanocytic nevi of scalp and neck: Secondary | ICD-10-CM | POA: Diagnosis not present

## 2024-03-17 ENCOUNTER — Other Ambulatory Visit: Payer: Self-pay | Admitting: Family Medicine

## 2024-03-21 DIAGNOSIS — J3081 Allergic rhinitis due to animal (cat) (dog) hair and dander: Secondary | ICD-10-CM | POA: Diagnosis not present

## 2024-03-21 DIAGNOSIS — J3089 Other allergic rhinitis: Secondary | ICD-10-CM | POA: Diagnosis not present

## 2024-03-21 DIAGNOSIS — J301 Allergic rhinitis due to pollen: Secondary | ICD-10-CM | POA: Diagnosis not present

## 2024-04-04 DIAGNOSIS — J3089 Other allergic rhinitis: Secondary | ICD-10-CM | POA: Diagnosis not present

## 2024-04-04 DIAGNOSIS — J3081 Allergic rhinitis due to animal (cat) (dog) hair and dander: Secondary | ICD-10-CM | POA: Diagnosis not present

## 2024-04-04 DIAGNOSIS — R052 Subacute cough: Secondary | ICD-10-CM | POA: Diagnosis not present

## 2024-04-04 DIAGNOSIS — J45909 Unspecified asthma, uncomplicated: Secondary | ICD-10-CM | POA: Diagnosis not present

## 2024-04-04 DIAGNOSIS — J301 Allergic rhinitis due to pollen: Secondary | ICD-10-CM | POA: Diagnosis not present

## 2024-04-07 ENCOUNTER — Encounter: Payer: Self-pay | Admitting: Family Medicine

## 2024-04-07 DIAGNOSIS — R053 Chronic cough: Secondary | ICD-10-CM

## 2024-04-14 ENCOUNTER — Ambulatory Visit: Admitting: Family Medicine

## 2024-04-19 ENCOUNTER — Other Ambulatory Visit: Payer: Self-pay | Admitting: Family Medicine

## 2024-04-19 DIAGNOSIS — Z1231 Encounter for screening mammogram for malignant neoplasm of breast: Secondary | ICD-10-CM

## 2024-05-05 ENCOUNTER — Other Ambulatory Visit: Payer: Self-pay | Admitting: Family Medicine

## 2024-05-08 ENCOUNTER — Other Ambulatory Visit: Payer: Self-pay | Admitting: Family Medicine

## 2024-05-09 ENCOUNTER — Ambulatory Visit
Admission: RE | Admit: 2024-05-09 | Discharge: 2024-05-09 | Disposition: A | Discharge status: Home / Self Care | Source: Ambulatory Visit | Attending: Family Medicine | Admitting: Family Medicine

## 2024-05-09 DIAGNOSIS — Z1231 Encounter for screening mammogram for malignant neoplasm of breast: Secondary | ICD-10-CM | POA: Diagnosis not present

## 2024-05-11 ENCOUNTER — Other Ambulatory Visit: Payer: Self-pay | Admitting: Hematology and Oncology

## 2024-05-16 ENCOUNTER — Other Ambulatory Visit: Payer: Self-pay | Admitting: Family Medicine

## 2024-05-17 ENCOUNTER — Ambulatory Visit: Payer: Medicare Other | Admitting: Hematology and Oncology

## 2024-05-20 ENCOUNTER — Telehealth: Payer: Self-pay

## 2024-05-20 NOTE — Telephone Encounter (Signed)
 Verbally confirmed appt for 6/30

## 2024-05-23 ENCOUNTER — Encounter: Payer: Self-pay | Admitting: Hematology and Oncology

## 2024-05-23 ENCOUNTER — Inpatient Hospital Stay: Payer: Medicare Other | Attending: Hematology and Oncology | Admitting: Hematology and Oncology

## 2024-05-23 ENCOUNTER — Inpatient Hospital Stay

## 2024-05-23 ENCOUNTER — Other Ambulatory Visit: Payer: Self-pay | Admitting: *Deleted

## 2024-05-23 VITALS — BP 124/56 | HR 58 | Temp 98.3°F | Resp 17 | Ht 64.0 in | Wt 142.8 lb

## 2024-05-23 DIAGNOSIS — Z170 Estrogen receptor positive status [ER+]: Secondary | ICD-10-CM | POA: Diagnosis not present

## 2024-05-23 DIAGNOSIS — C50911 Malignant neoplasm of unspecified site of right female breast: Secondary | ICD-10-CM | POA: Insufficient documentation

## 2024-05-23 DIAGNOSIS — Z808 Family history of malignant neoplasm of other organs or systems: Secondary | ICD-10-CM | POA: Diagnosis not present

## 2024-05-23 DIAGNOSIS — Z803 Family history of malignant neoplasm of breast: Secondary | ICD-10-CM | POA: Insufficient documentation

## 2024-05-23 DIAGNOSIS — Z801 Family history of malignant neoplasm of trachea, bronchus and lung: Secondary | ICD-10-CM | POA: Insufficient documentation

## 2024-05-23 DIAGNOSIS — C50411 Malignant neoplasm of upper-outer quadrant of right female breast: Secondary | ICD-10-CM | POA: Diagnosis not present

## 2024-05-23 DIAGNOSIS — Z9011 Acquired absence of right breast and nipple: Secondary | ICD-10-CM | POA: Insufficient documentation

## 2024-05-23 DIAGNOSIS — Z79810 Long term (current) use of selective estrogen receptor modulators (SERMs): Secondary | ICD-10-CM | POA: Diagnosis not present

## 2024-05-23 DIAGNOSIS — Z85820 Personal history of malignant melanoma of skin: Secondary | ICD-10-CM | POA: Insufficient documentation

## 2024-05-23 DIAGNOSIS — Z807 Family history of other malignant neoplasms of lymphoid, hematopoietic and related tissues: Secondary | ICD-10-CM | POA: Insufficient documentation

## 2024-05-23 LAB — CBC WITH DIFFERENTIAL (CANCER CENTER ONLY)
Abs Immature Granulocytes: 0.01 10*3/uL (ref 0.00–0.07)
Basophils Absolute: 0.1 10*3/uL (ref 0.0–0.1)
Basophils Relative: 1 %
Eosinophils Absolute: 0.8 10*3/uL — ABNORMAL HIGH (ref 0.0–0.5)
Eosinophils Relative: 12 %
HCT: 37.2 % (ref 36.0–46.0)
Hemoglobin: 12.5 g/dL (ref 12.0–15.0)
Immature Granulocytes: 0 %
Lymphocytes Relative: 27 %
Lymphs Abs: 1.8 10*3/uL (ref 0.7–4.0)
MCH: 29.7 pg (ref 26.0–34.0)
MCHC: 33.6 g/dL (ref 30.0–36.0)
MCV: 88.4 fL (ref 80.0–100.0)
Monocytes Absolute: 0.6 10*3/uL (ref 0.1–1.0)
Monocytes Relative: 8 %
Neutro Abs: 3.6 10*3/uL (ref 1.7–7.7)
Neutrophils Relative %: 52 %
Platelet Count: 304 10*3/uL (ref 150–400)
RBC: 4.21 MIL/uL (ref 3.87–5.11)
RDW: 12.3 % (ref 11.5–15.5)
WBC Count: 6.8 10*3/uL (ref 4.0–10.5)
nRBC: 0 % (ref 0.0–0.2)

## 2024-05-23 LAB — CMP (CANCER CENTER ONLY)
ALT: 7 U/L (ref 0–44)
AST: 12 U/L — ABNORMAL LOW (ref 15–41)
Albumin: 4 g/dL (ref 3.5–5.0)
Alkaline Phosphatase: 33 U/L — ABNORMAL LOW (ref 38–126)
Anion gap: 8 (ref 5–15)
BUN: 14 mg/dL (ref 8–23)
CO2: 31 mmol/L (ref 22–32)
Calcium: 10.2 mg/dL (ref 8.9–10.3)
Chloride: 102 mmol/L (ref 98–111)
Creatinine: 1.05 mg/dL — ABNORMAL HIGH (ref 0.44–1.00)
GFR, Estimated: 56 mL/min — ABNORMAL LOW (ref >60)
Glucose, Bld: 103 mg/dL — ABNORMAL HIGH (ref 70–99)
Potassium: 3.5 mmol/L (ref 3.5–5.1)
Sodium: 141 mmol/L (ref 135–145)
Total Bilirubin: 0.4 mg/dL (ref 0.0–1.2)
Total Protein: 7.4 g/dL (ref 6.5–8.1)

## 2024-05-23 NOTE — Progress Notes (Signed)
 [facility]  Telephone:([phone]) [phone] [phone]) [phone]     ID: Theresa Bradley DOB: 05-Nov-1951  MR#: [id number]  RDW#:[id number]  Patient Care Team: Micheal Wolm ORN, MD as PCP - General Fleeta Smock, Lamar BROCKS, MD as Consulting Physician (Allergy and Immunology) Elspeth Lauraine DEL, OD as Referring Physician Vanderbilt Ned, MD as Consulting Physician (General Surgery) [name], Sandria BROCKS, MD (Inactive) as Consulting Physician (Oncology) Izell Lauraine, MD as Attending Physician (Radiation Oncology) Court Pulling, MD as Referring Physician (Dermatology) Rolan Ezra RAMAN, MD as Consulting Physician (Cardiology) Amber Stalls, MD OTHER MD:   CHIEF COMPLAINT: 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  History of Present Illness Theresa Bradley is a 73 year old female with breast cancer on tamoxifen  who presents for a follow-up visit.  She is currently taking tamoxifen  and gabapentin . Gabapentin  has been effective in managing her hot flashes and also aids in deep sleep. She is concerned about the process of discontinuing tamoxifen , having heard from an acquaintance that it can be challenging, although she has not experienced any issues herself.  She has been experiencing symptoms resembling a cold since December, which have persisted despite two courses of antibiotics. Symptoms include significant drainage and occasional sore throat. A chest x-ray was performed and was normal. She plans to see an ENT specialist for further evaluation.  She has noticed some white spots on her gums, which her dentist is monitoring. The dentist mentioned the possibility of a biopsy if changes occur, but no changes have been noted so far. She plans to discuss this with the ENT specialist as  well.  She reports a decrease in appetite and has lost weight over the past couple of years, from 154 pounds in 2023 to 142 pounds currently. She attributes this to her reduced appetite.  Rest of the pertinent 10 point ROS reviewed and neg.   COVID 19 VACCINATION STATUS: Status post Pfizer x2+ booster in October 2021   HISTORY OF CURRENT ILLNESS: From the original intake note:   Theresa Bradley was my patient a little over 11 years ago when she underwent lumpectomy on 05/15/2008 for ductal carcinoma in situ and received radiation therapy under Dr. Jason. 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  More recently, she presented to her PCP with a small palpable right breast lump around 11 o'clock, just superior and lateral to the nipple. She underwent bilateral diagnostic mammography with tomography and right breast ultrasonography at [facility] on 12/26/2019 showing: breast density category B; 6 mm superficial mass in the right breast at 11 o'clock in the retroareolar region; no enlarged adenopathy in right axilla.  Accordingly on 12/30/2019 she proceeded to biopsy of the right breast area in question. The pathology from this procedure ([id number]) showed: invasive ductal carcinoma, grade 2. Prognostic indicators significant for: estrogen receptor, 95% positive and progesterone receptor, 80% positive, both with strong staining intensity. Proliferation marker Ki67 at 20%. HER2 positive by immunohistochemistry (3+).  The patient's subsequent history is as detailed below.   PAST MEDICAL HISTORY: Past Medical History:  Diagnosis Date   Anxiety    Breast cancer (HCC) 2009   right   Breast cancer (HCC) 2021   Cancer (HCC)  COUGH, CHRONIC 05/10/2010   Dysrhythmia    occationally skips a beat   Family history of brain cancer    Family history of breast cancer    Family history of lung  cancer    Family history of multiple myeloma    Family history of skin cancer    GERD (gastroesophageal reflux disease)    HYPERTENSION 04/16/2009   HYPOTHYROIDISM 04/16/2009   OSTEOARTHRITIS 04/16/2009   Personal history of chemotherapy 2021   Personal history of radiation therapy 2009   SENILE LENTIGO 06/10/2010    PAST SURGICAL HISTORY: Past Surgical History:  Procedure Laterality Date   BREAST EXCISIONAL BIOPSY Right    BREAST EXCISIONAL BIOPSY Left    BREAST LUMPECTOMY Right 2009   radiation only   BREAST LUMPECTOMY WITH RADIOACTIVE SEED AND SENTINEL LYMPH NODE BIOPSY Right 01/31/2020   Procedure: RIGHT BREAST LUMPECTOMY WITH RADIOACTIVE SEED AND SENTINEL LYMPH NODE BIOPSY;  Surgeon: Vanderbilt Ned, MD;  Location: [facility];  Service: General;  Laterality: Right;  PEC BLOCK   BREAST SURGERY  2009   X 2, cancer stage 0, lumpectomy   CESAREAN SECTION     1979, 1982   CHOLECYSTECTOMY     COLONOSCOPY     DILATATION & CURETTAGE/HYSTEROSCOPY WITH MYOSURE  11/2015   HERNIA REPAIR  2009   JOINT REPLACEMENT  2008   hip   MASTECTOMY Right 2021   OPEN SURGICAL REPAIR OF GLUTEAL TENDON Left 07/18/2019   Procedure: Left hip bearing surface revision with gluteal tendon repair;  Surgeon: Melodi Lerner, MD;  Location: [facility];  Service: Orthopedics;  Laterality: Left;  2 hrs   PORTACATH PLACEMENT N/A 03/15/2020   Procedure: INSERTION PORT-A-CATH WITH ULTRASOUND GUIDANCE;  Surgeon: Vanderbilt Ned, MD;  Location: [facility];  Service: General;  Laterality: N/A;   SIMPLE MASTECTOMY WITH AXILLARY SENTINEL NODE BIOPSY Right 03/15/2020   Procedure: RIGHT SIMPLE MASTECTOMY;  Surgeon: Vanderbilt Ned, MD;  Location: [facility];  Service: General;  Laterality: Right;    FAMILY HISTORY: Family History  Problem Relation Age of Onset   Skin cancer Mother    Hyperlipidemia Father    Dementia Father    Skin cancer Father    Heart attack Father     Diabetes Brother    Hypertension Brother    Breast cancer Maternal Aunt 59   Breast cancer Maternal Aunt        dx. mid-70s   Cancer Paternal Aunt        unknown type, dx. >50   Lung cancer Paternal Uncle    Multiple myeloma Paternal Uncle    Brain cancer Cousin        dx. 13s, paternal cousin   Arthritis Other    Hypertension Other    Cancer Other        2 aunts   Colon cancer Neg Hx    Esophageal cancer Neg Hx    Pancreatic cancer Neg Hx    Stomach cancer Neg Hx   The patient's father is 75 years old and the patient's mother is 37 years old as of February 2021.  The patient has one brother, no sisters.  A maternal aunt was diagnosed with breast cancer at age 32.  A paternal uncle was diagnosed with lung cancer in his 36s and another paternal uncle with a blood cancer in his late 68s.  There is no history of ovarian pancreatic or prostate cancer in the family to her knowledge.  GYNECOLOGIC HISTORY:  No LMP recorded. Patient is postmenopausal. Menarche: 73 years old Age at first live birth: 73 years old GX P 2 LMP HRT no  Hysterectomy?  No BSO?  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  Her husband Lemond runs an environmental company that for example checks for mold and then remediates.  Son Charlie, 64 years old, lives in [place] and works as a Product/process development scientist.  He has 2 sons.  The patient's son Adina 68 lives in [place] and works in Data processing manager.  He married October 2021.  The patient is Episcopalian    ADVANCED DIRECTIVES: In the absence of any documentation to the contrary, the patient's spouse is their HCPOA.    HEALTH MAINTENANCE: Social History   Tobacco Use   Smoking status: Never   Smokeless tobacco: Never   Tobacco comments:    father smoked when she was younger   Vaping Use   Vaping status: Never Used  Substance Use Topics   Alcohol use:  Yes    Comment: occasionally    Drug use: No     Colonoscopy: 03/2009/[place]  PAP: 10/2013, negative  Bone density: 11/2015, T score -1.7   Allergies  Allergen Reactions   Codeine Sulfate Nausea And Vomiting   Erythromycin Base Other (See Comments)    Stomach ache   Esomeprazole Magnesium     REACTION: GI upset    Current Outpatient Medications  Medication Sig Dispense Refill   ALPRAZolam  (XANAX ) 0.25 MG tablet TAKE 1/2 TABLET BY MOUTH AT BEDTIME AS NEEDED FOR INSOMNIA. 45 tablet 0   amLODipine  (NORVASC ) 5 MG tablet TAKE 1 TABLET (5 MG TOTAL) BY MOUTH DAILY. 90 tablet 0   CALCIUM-VITAMIN D PO Take 2 tablets by mouth daily.     citalopram  (CELEXA ) 20 MG tablet TAKE 1 TABLET BY MOUTH EVERY DAY 90 tablet 1   EPIPEN  2-PAK 0.3 MG/0.3ML SOAJ injection Inject 0.3 mg into the muscle as needed for anaphylaxis.      Fluticasone  Furoate-Vilanterol (BREO ELLIPTA IN) Inhale 1 puff into the lungs daily.     gabapentin  (NEURONTIN ) 300 MG capsule TAKE 1 CAPSULE BY MOUTH EVERYDAY AT BEDTIME 90 capsule 4   hydrochlorothiazide  (MICROZIDE ) 12.5 MG capsule TAKE 1 CAPSULE BY MOUTH EVERY DAY 90 capsule 0   levothyroxine  (SYNTHROID ) 50 MCG tablet TAKE 1 TABLET BY MOUTH EVERY DAY 90 tablet 1   meloxicam  (MOBIC ) 15 MG tablet TAKE 1 TABLET BY MOUTH EVERY DAY 90 tablet 0   polyethylene glycol (MIRALAX  / GLYCOLAX ) 17 g packet Take 17 g by mouth every other day.     tamoxifen  (NOLVADEX ) 20 MG tablet TAKE 1 TABLET BY MOUTH EVERY DAY 90 tablet 3   tretinoin  (RETIN-A ) 0.025 % cream Apply topically at bedtime. 45 g 6   No current facility-administered medications for this visit.    OBJECTIVE: White woman in no acute distress  There were no vitals filed for this visit.    There is no height or weight on file to calculate BMI.   Wt Readings from Last 3 Encounters:  09/15/23 154 lb 1.6 oz (69.9 kg)  05/15/23 154 lb 9.6 oz (70.1 kg)  02/09/23 152 lb 12.8 oz (69.3 kg)     ECOG FS:1 - Symptomatic but  completely ambulatory  Physical Exam Constitutional:      General: She is not in acute distress.  Appearance: Normal appearance.   Cardiovascular:     Rate and Rhythm: Normal rate and regular rhythm.     Pulses: Normal pulses.     Heart sounds: Normal heart sounds.  Chest:     Comments: Right mastectomy scar with no recurrence. Left breast normal to inspection and palpation. No regional adenopathy  Musculoskeletal:     Cervical back: Normal range of motion and neck supple. No rigidity.  Lymphadenopathy:     Cervical: No cervical adenopathy.   Skin:    General: Skin is warm and dry.   Neurological:     Mental Status: She is alert.      LAB RESULTS:  CMP     Component Value Date/Time   NA 136 10/26/2023 1416   NA 136 10/26/2023 1416   K 3.6 10/26/2023 1416   K 3.6 10/26/2023 1416   CL 96 10/26/2023 1416   CL 96 10/26/2023 1416   CO2 32 10/26/2023 1416   CO2 32 10/26/2023 1416   GLUCOSE 97 10/26/2023 1416   BUN 15 10/26/2023 1416   CREATININE 1.03 10/26/2023 1416   CREATININE 0.96 05/14/2022 1240   CALCIUM 9.3 10/26/2023 1416   PROT 7.3 05/14/2022 1240   ALBUMIN 4.0 05/14/2022 1240   AST 13 (L) 05/14/2022 1240   ALT 6 05/14/2022 1240   ALKPHOS 43 05/14/2022 1240   BILITOT 0.4 05/14/2022 1240   GFRNONAA >60 05/14/2022 1240   GFRAA >60 08/14/2020 1227   GFRAA >60 01/11/2020 1209    No results found for: TOTALPROTELP, ALBUMINELP, A1GS, A2GS, BETS, BETA2SER, GAMS, MSPIKE, SPEI  Lab Results  Component Value Date   WBC 5.9 05/14/2022   NEUTROABS 3.3 05/14/2022   HGB 13.1 05/14/2022   HCT 39.7 05/14/2022   MCV 91.3 05/14/2022   PLT 299 05/14/2022    No results found for: LABCA2  No components found for: [id number]  No results for input(s): INR in the last 168 hours.  No results found for: LABCA2  No results found for: RJW800  No results found for: CAN125  No results found for: CAN153  No results found for:  CA2729  No components found for: HGQUANT  No results found for: CEA1, CEA / No results found for: CEA1, CEA   No results found for: AFPTUMOR  No results found for: CHROMOGRNA  No results found for: KPAFRELGTCHN, LAMBDASER, KAPLAMBRATIO (kappa/lambda light chains)  No results found for: HGBA, HGBA2QUANT, HGBFQUANT, HGBSQUAN (Hemoglobinopathy evaluation)   No results found for: LDH  No results found for: IRON, TIBC, IRONPCTSAT (Iron and TIBC)  No results found for: FERRITIN  Urinalysis    Component Value Date/Time   COLORURINE yellow 05/24/2010 0855   APPEARANCEUR Clear 05/24/2010 0855   LABSPEC 1.015 05/24/2010 0855   PHURINE 6.0 05/24/2010 0855   GLUCOSEU NEGATIVE 09/22/2007 1110   HGBUR negative 05/24/2010 0855   BILIRUBINUR n 06/29/2019 1533   KETONESUR NEGATIVE 09/22/2007 1110   PROTEINUR Negative 06/29/2019 1533   PROTEINUR NEGATIVE 09/22/2007 1110   UROBILINOGEN 0.2 06/29/2019 1533   UROBILINOGEN 0.2 05/24/2010 0855   NITRITE n 06/29/2019 1533   NITRITE negative 05/24/2010 0855   LEUKOCYTESUR Negative 06/29/2019 1533    STUDIES: MM 3D SCREENING MAMMOGRAM UNILATERAL LEFT BREAST Result Date: 05/11/2024 CLINICAL DATA:  Screening. EXAM: DIGITAL SCREENING UNILATERAL LEFT MAMMOGRAM WITH CAD AND TOMOSYNTHESIS TECHNIQUE: Left screening digital craniocaudal and mediolateral oblique mammograms were obtained. Left screening digital breast tomosynthesis was performed. The images were evaluated with computer-aided detection. COMPARISON:  Previous exam(s).  ACR Breast Density Category b: There are scattered areas of fibroglandular density. FINDINGS: There are no findings suspicious for malignancy. IMPRESSION: No mammographic evidence of malignancy. A result letter of this screening mammogram will be mailed directly to the patient. RECOMMENDATION: Screening mammogram in one year. (Code:SM-B-01Y) BI-RADS CATEGORY  1: Negative. Electronically  Signed   By: Craig Farr M.D.   On: 05/11/2024 13:20      ELIGIBLE FOR AVAILABLE RESEARCH PROTOCOL: no  ASSESSMENT: 73 y.o. [place] woman status post right breast periareolar biopsy 12/30/2019 for a clinical T1a N0, stage I invasive ductal carcinoma, grade 2, estrogen and progesterone receptor positive, HER-2 amplified, with an MIB-one of 20%.  (1) history of prior right lumpectomy June 2009 for ductal carcinoma in situ  (a) status post adjuvant radiation  (b) did not receive antiestrogens  She then had right breast melanoma in 2020, resected, didn't need adjuvant immunotherapy.  (2) status post repeat right lumpectomy and sentinel lymph node sampling 01/31/2020 for a pT1c pN0, stage IA invasive ductal carcinoma, grade 2, with positive margins  (a) total one sentinel node removed  (3) s/p right mastectomy 03/15/2020 showing residual invasive ductal carcinoma measuring 0.9 cm, but negative margins (added to prior 1.1 lumpectomy, still T1 N0 or stage IA)  (a) one additional axillary node was removed (total 2 right axillary nodes removed)  (3) adjuvant chemotherapy with paclitaxel  and trastuzumab  weekly x 12 started 04/24/2020, completing 10 doses 06/26/2020   (a) final 2 doses omitted secondary to peripheral neuropathy  (4) trastuzumab  to be continued every 21 days to total one year (through May 2022).  (a) echo 01/17/2020 shows an ejection fraction in the 60-65% range  (b) echo 05/30/2020 shows an ejection fraction of 60-65%.  (c) echo 08/28/2020 shows an ejection fraction in the 55-60% range  (d) echo 12/14/2020 shows an ejection fraction in the 60-65% range.  (e) echo 03/26/2021 shows an EF in the 55% range with decreased GLS  (f) repeat echo 07/26/2021 shows the left ventricular ejection fraction back to 60-65%  (5) started tamoxifen  07/31/2020  (a) Bone density in 11/2015 shows osteopenia with T score of -1.7.  (b) bone density 03/04/2021 shows a T score of -2.0  (6) genetics  testing 02/01/2020 through the [facility] STAT panel found no deleterious mutations then ATM, BRCA1, BRCA2, CDH1, CHEK2, PALB2, PTEN, STK11 and TP53.    Assessment and Plan Assessment & Plan Tamoxifen  therapy Continues tamoxifen  with good tolerance. Mammogram satisfactory. Scheduled to complete therapy in September 2026. Discussed cessation issues; no evidence supports tapering. Agreed to monitor post-cessation. - Continue tamoxifen  therapy until September 2026. - Monitor for issues upon cessation.  Gabapentin  therapy Gabapentin  used for hot flashes and sleep. Discussed tapering if discontinuation desired. Prefers minimal medication but acknowledges benefits for sleep. - Continue gabapentin  therapy. - Plan gradual taper if discontinuation desired.  Chronic cold symptoms Persistent symptoms since December. Previous antibiotics ineffective. ENT referral for further evaluation,  White spots on gums White spots noted by dentist, unchanged over two weeks. ENT evaluation suggested for comprehensive oral examination. - Refer to ENT for evaluation of white spots on gums.  Weight loss Weight loss from 154 lbs to 142 lbs over 2 yrs. Reports decreased appetite. We will continue to monitor.  General Health Maintenance Mammogram satisfactory. Scheduled to complete tamoxifen  therapy in September 2026.  Follow-up Follow-up plans discussed for ongoing management and monitoring. - Order CMP today to assess liver function. - Schedule follow-up appointment in one year.  RTC  with us  in 1 yr.  Total encounter time 30  minutes.*   *Total Encounter Time as defined by the Centers for Medicare and Medicaid Services includes, in addition to the face-to-face time of a patient visit (documented in the note above) non-face-to-face time: obtaining and reviewing outside history, ordering and reviewing medications, tests or procedures, care coordination (communications with other health care  professionals or caregivers) and documentation in the medical record.

## 2024-05-25 ENCOUNTER — Encounter: Payer: Self-pay | Admitting: Family Medicine

## 2024-06-05 ENCOUNTER — Other Ambulatory Visit: Payer: Self-pay | Admitting: Family Medicine

## 2024-06-12 ENCOUNTER — Other Ambulatory Visit: Payer: Self-pay | Admitting: Family Medicine

## 2024-06-17 ENCOUNTER — Ambulatory Visit (INDEPENDENT_AMBULATORY_CARE_PROVIDER_SITE_OTHER): Admitting: Otolaryngology

## 2024-06-17 ENCOUNTER — Encounter (INDEPENDENT_AMBULATORY_CARE_PROVIDER_SITE_OTHER): Payer: Self-pay | Admitting: Otolaryngology

## 2024-06-17 VITALS — BP 128/72 | HR 61

## 2024-06-17 DIAGNOSIS — R490 Dysphonia: Secondary | ICD-10-CM

## 2024-06-17 DIAGNOSIS — J383 Other diseases of vocal cords: Secondary | ICD-10-CM

## 2024-06-17 DIAGNOSIS — R053 Chronic cough: Secondary | ICD-10-CM

## 2024-06-17 DIAGNOSIS — R0982 Postnasal drip: Secondary | ICD-10-CM

## 2024-06-17 MED ORDER — LEVOCETIRIZINE DIHYDROCHLORIDE 5 MG PO TABS
5.0000 mg | ORAL_TABLET | Freq: Every evening | ORAL | 3 refills | Status: DC
Start: 2024-06-17 — End: 2024-10-03

## 2024-06-17 MED ORDER — FLUTICASONE PROPIONATE 50 MCG/ACT NA SUSP: 2.0000 | Freq: Two times a day (BID) | NASAL | 6 refills | Status: DC

## 2024-06-17 NOTE — Progress Notes (Signed)
 xENT CONSULT:  Reason for Consult: chronic dysphonia and chronic cough    HPI: Discussed the use of AI scribe software for clinical note transcription with the patient, who gave verbal consent to proceed.  History of Present Illness Theresa Bradley is a 73 year old female with chronic cough who presents with persistent cough and voice changes. She was referred by Dr. Fleeta Bradley Allergies for evaluation of her chronic cough for several years and voice changes/dysphonia for several months  She has experienced a chronic cough for many years, which worsened after an illness in December 2024. She also experienced persistent hoarseness at the same time, and has had it since. The cough is associated with phlegm production, sometimes green in color, and occasional throat discomfort. A chest x-ray performed in May was normal. She has a history of asthma and has been prescribed an emergency inhaler, which she has not needed to use in the past month and a half. During her worst symptoms, she coughed up significant amounts of phlegm, which has since decreased but still occurs. She occasionally experiences throat pain, described as being lower in the throat, and has had episodes of difficulty swallowing, though these are not frequent.  Her voice has changed since December. She has a history of allergies and has been receiving allergy shots for years, though she stopped in May due to illness and antibiotic use. She has tried various antihistamines, including Zyrtec and Claritin, without significant relief. She performs daily nasal saline rinses and has a bottle of Flonase .   In the past, she consulted a pulmonologist who diagnosed her with a cyclical cough and provided treatment, but the cough returned. This was approximately seven or eight years ago.  Records Reviewed:   PCP office visit note 04/04/24 [name] presents for an ad hoc visit.  She was last seen in January, at which time she was  empirically treated for sinusitis with cefdinir.    Albuterol  HFA has been used 3 times a day since last week in association with having a wet cough. When needed, albuterol  has seemed to provide prompt benefit.  She has had spells of repetitive cough that last 10-15 minutes.  Usually, cough results in production.  Secretions maybe green or clear. Breo has recently been taken as needed as she feels it does not help to alleviate her symptoms. She states her cough and secretions starting in 10/2023; symptoms improved after the last visit but she never got back to normal. Hoarseness has been present over the last several months. She has not been evaluated by pulmonary recently. CXR has not been performed recently. PND has been associated with her cough. She denies the use of antihistamines, steroid nasal sprays or antihistamine nasal sprays. She questions what medications could help to alleviate her cough. She sometimes has frontal headache but not maxillary pressure.   She took famotidine  for several years and it was not helpful, so she stopped it.  She has not had overt heartburn.  She has not been on omeprazole.  About 6 weeks ago, she tried Allegra, Flonase , Cetirizine and Claritin, each for about a week at a time.  None of the medications were effective.    Oncology note 05/23/24 Dr Theresa Bradley 05/23/24 Tamoxifen  therapy Continues tamoxifen  with good tolerance. Mammogram satisfactory. Scheduled to complete therapy in September 2026. Discussed cessation issues; no evidence supports tapering. Agreed to monitor post-cessation. - Continue tamoxifen  therapy until September 2026. - Monitor for issues upon cessation.   Gabapentin   therapy Gabapentin  used for hot flashes and sleep. Discussed tapering if discontinuation desired. Prefers minimal medication but acknowledges benefits for sleep. - Continue gabapentin  therapy. - Plan gradual taper if discontinuation desired.   Chronic cold symptoms Persistent symptoms  since December. Previous antibiotics ineffective. ENT referral for further evaluation,   White spots on gums White spots noted by dentist, unchanged over two weeks. ENT evaluation suggested for comprehensive oral examination. - Refer to ENT for evaluation of white spots on gums.    Past Medical History:  Diagnosis Date   Anxiety    Breast cancer (HCC) 2009   right   Breast cancer (HCC) 2021   Cancer (HCC)    COUGH, CHRONIC 05/10/2010   Dysrhythmia    occationally skips a beat   Family history of brain cancer    Family history of breast cancer    Family history of lung cancer    Family history of multiple myeloma    Family history of skin cancer    GERD (gastroesophageal reflux disease)    HYPERTENSION 04/16/2009   HYPOTHYROIDISM 04/16/2009   OSTEOARTHRITIS 04/16/2009   Personal history of chemotherapy 2021   Personal history of radiation therapy 2009   SENILE LENTIGO 06/10/2010    Past Surgical History:  Procedure Laterality Date   BREAST EXCISIONAL BIOPSY Right    BREAST EXCISIONAL BIOPSY Left    BREAST LUMPECTOMY Right 2009   radiation only   BREAST LUMPECTOMY WITH RADIOACTIVE SEED AND SENTINEL LYMPH NODE BIOPSY Right 01/31/2020   Procedure: RIGHT BREAST LUMPECTOMY WITH RADIOACTIVE SEED AND SENTINEL LYMPH NODE BIOPSY;  Surgeon: Theresa Ned, MD;  Location: [facility];  Service: General;  Laterality: Right;  PEC BLOCK   BREAST SURGERY  2009   X 2, cancer stage 0, lumpectomy   CESAREAN SECTION     1979, 1982   CHOLECYSTECTOMY     COLONOSCOPY     DILATATION & CURETTAGE/HYSTEROSCOPY WITH MYOSURE  11/2015   HERNIA REPAIR  2009   JOINT REPLACEMENT  2008   hip   MASTECTOMY Right 2021   OPEN SURGICAL REPAIR OF GLUTEAL TENDON Left 07/18/2019   Procedure: Left hip bearing surface revision with gluteal tendon repair;  Surgeon: Theresa Lerner, MD;  Location: [facility];  Service: Orthopedics;  Laterality: Left;  2 hrs   PORTACATH PLACEMENT N/A 03/15/2020    Procedure: INSERTION PORT-A-CATH WITH ULTRASOUND GUIDANCE;  Surgeon: Theresa Ned, MD;  Location: [facility];  Service: General;  Laterality: N/A;   SIMPLE MASTECTOMY WITH AXILLARY SENTINEL NODE BIOPSY Right 03/15/2020   Procedure: RIGHT SIMPLE MASTECTOMY;  Surgeon: Theresa Ned, MD;  Location: [facility];  Service: General;  Laterality: Right;    Family History  Problem Relation Age of Onset   Skin cancer Mother    Hyperlipidemia Father    Dementia Father    Skin cancer Father    Heart attack Father    Diabetes Brother    Hypertension Brother    Breast cancer Maternal Aunt 65   Breast cancer Maternal Aunt        dx. mid-70s   Cancer Paternal Aunt        unknown type, dx. >50   Lung cancer Paternal Uncle    Multiple myeloma Paternal Uncle    Brain cancer Cousin        dx. 16s, paternal cousin   Arthritis Other    Hypertension Other    Cancer Other        2  aunts   Colon cancer Neg Hx    Esophageal cancer Neg Hx    Pancreatic cancer Neg Hx    Stomach cancer Neg Hx     Social History:  reports that she has never smoked. She has never used smokeless tobacco. She reports current alcohol use. She reports that she does not use drugs.  Allergies:  Allergies  Allergen Reactions   Codeine Sulfate Nausea And Vomiting   Erythromycin Base Other (See Comments)    Stomach ache   Esomeprazole Magnesium     REACTION: GI upset    Medications: I have reviewed the patient's current medications.  The PMH, PSH, Medications, Allergies, and SH were reviewed and updated.  ROS: Constitutional: Negative for fever, weight loss and weight gain. Cardiovascular: Negative for chest pain and dyspnea on exertion. Respiratory: Is not experiencing shortness of breath at rest. Gastrointestinal: Negative for nausea and vomiting. Neurological: Negative for headaches. Psychiatric: The patient is not nervous/anxious  Blood pressure 128/72, pulse 61, SpO2 93%.  There is no height or weight on file to calculate BMI.  PHYSICAL EXAM:  Exam: General: Well-developed, well-nourished Communication and Voice: Clear pitch and clarity Respiratory Respiratory effort: Equal inspiration and expiration without stridor Cardiovascular Peripheral Vascular: Warm extremities with equal color/perfusion Eyes: No nystagmus with equal extraocular motion bilaterally Neuro/Psych/Balance: Patient oriented to person, place, and time; Appropriate mood and affect; Gait is intact with no imbalance; Cranial nerves I-XII are intact Head and Face Inspection: Normocephalic and atraumatic without mass or lesion Palpation: Facial skeleton intact without bony stepoffs Salivary Glands: No mass or tenderness Facial Strength: Facial motility symmetric and full bilaterally ENT Pinna: External ear intact and fully developed External canal: Canal is patent with intact skin Tympanic Membrane: Clear and mobile External Nose: No scar or anatomic deformity Internal Nose: Septum is relatively straight. No polyp, or purulence. Mucosal edema and erythema present.  Bilateral inferior turbinate hypertrophy.  Lips, Teeth, and gums: Mucosa and teeth intact and viable TMJ: No pain to palpation with full mobility Oral cavity/oropharynx: No erythema or exudate, no lesions present Nasopharynx: No mass or lesion with intact mucosa Hypopharynx: Intact mucosa without pooling of secretions Larynx Glottic: Full true vocal cord mobility without lesion or mass VF atrophy Supraglottic: Normal appearing epiglottis and AE folds Interarytenoid Space: Moderate pachydermia&edema Subglottic Space: Patent without lesion or edema Neck Neck and Trachea: Midline trachea without mass or lesion Thyroid : No mass or nodularity Lymphatics: No lymphadenopathy  Procedure:  Preoperative diagnosis: hoarseness  Postoperative diagnosis:   same VF atrophy and glottic insufficiency  Procedure: Flexible fiberoptic  laryngoscopy with stroboscopy (68420)   Surgeon: Elena Larry, MD  Anesthesia: Topical lidocaine  and Afrin  Complications: None  Condition is stable throughout exam  Indications and consent:   The patient presents to the clinic with hoarseness. All the risks, benefits, and potential complications were reviewed with the patient preoperatively and informed verbal consent was obtained.  Procedure: The patient was seated upright in the exam chair.   Topical lidocaine  and Afrin were applied to the nasal cavity. After adequate anesthesia had occurred, the flexible telescope with strobe capabilities was passed into the nasal cavity. The nasopharynx was patent without mass or lesion. The scope was passed behind the soft palate and directed toward the base of tongue. The base of tongue was visualized and was symmetric with no apparent masses or abnormal appearing tissue. There were no signs of a mass or pooling of secretions in the piriform sinuses. The supraglottic structures were normal.  The true vocal cords are mobile. The medial edges were straight. Closure was complete but with severe supraglottic compression. Periodicity present. The mucosal wave and amplitude were slightly decreased 2/2 straight and supraglottic compression. There is moder ate interarytenoid pachydermia and post cricoid edema. The mucosa appears without lesions.   The laryngoscope was then slowly withdrawn and the patient tolerated the procedure well. There were no complications or blood loss.    Studies Reviewed: CXR 04/15/24 FINDINGS: Cardiomediastinal silhouette unchanged in size and contour. No evidence of central vascular congestion. No interlobular septal thickening. No pneumothorax or pleural effusion. Coarsened interstitial markings, with no confluent airspace disease.   Right IJ port catheter is unchanged.   No acute displaced fracture   IMPRESSION: No active cardiopulmonary  disease  Assessment/Plan: Encounter Diagnoses  Name Primary?   Chronic cough Yes   Dysphonia [R49.0]    Age-related vocal fold atrophy    Glottic insufficiency    Hoarseness     Assessment and Plan Assessment & Plan Chronic cough We discussed common causes of chronic cough and neurogenic cough. Previous treatments for post-nasal drainage and reflux were ineffective. Was seen by Pulm before and diagnosed with cyclical cough. Normal chest x-ray 03/2024. - Review superior laryngeal nerve block information. - Xyzal 5 mg daily and Flonase , and nasal rinses daily - Consider superior laryngeal nerve block if symptoms persist.  Environmental allergies and Post-nasal drainage  Postnasal drip contributing to chronic cough. Previous treatments ineffective. - Continue nasal saline rinses. - Start Flonase  nasal spray BID - Prescribed Xyzal. - continue allergy shots   Dysphonia Hoarseness x 7 months Strobe today with supraglottic compression, VF atrophy and glottic insufficiency. We discussed a trial of speech therapy - Refer to speech therapy.  RTC 3 months    Thank you for allowing me to participate in the care of this patient. Please do not hesitate to contact me with any questions or concerns.   Elena Larry, MD Otolaryngology [facility] Phone: [phone] Fax: [phone]    06/17/2024, 11:23 AM

## 2024-06-17 NOTE — Patient Instructions (Addendum)
 [facility] Nasal Saline Rinse   - start nasal saline rinses with NeilMed Bottle available over the counter or online to help with nasal congestion     Superior laryngeal nerve block for chronic cough, throat clearing, or pain  Some patients have symptoms from inflammation or hypersensitivity of the nerve that provides sensation to the inside of the throat. This nerve enters the throat right above the Adam's apple, and there is one on each side. It is called the superior laryngeal nerve.  An injection of lidocaine  and steroid can be given to this area where the sensory nerve enters the throat.  This sometimes helps patients with an oversensitive nerve or cough reflex. It resets an abnormal cough threshold or overactive pain signals from the throat. The steroid acts to decrease any inflammation around the nerve that could be adding to this hypersensitivity.  The injection may help right away, or it may take up to a week or two to start working.  If it hasn't helped at all after 2-3 weeks, Dr Casidee Jann will often try a second injection.  Some patients with no response to the first injection still have a good response to the second one.  There is no set schedule for repeating these injections.  It is based entirely on how long they help your symptoms.  If you find the injection helpful but the symptoms come back several months later, then you can repeat it at that time.  Some patients never need a second injection.  Side effects from the injection are mostly related to numbness inside the throat.  If this happens, you may feel one or both sides of your throat go numb. This will last about 1 hour, and you should not swallow anything until the sensation returns to normal, usually about an hour.  It is sometimes frightening to patients when the sensation inside their throat changes, but it is not dangerous.  You may choose to stay in our waiting room until the feeling goes away, but this is not a  requirement.  If this happens, it will improve gradually over the hour after the procedure. Other risks include accidental injection into the blood vessels, pain, temporary swallowing trouble or voice changes, and side effects related to steroids generally, like raised blood sugar or increased appetite. Many patients do not notice any steroid-related side effects from the injection, but if you have never taken steroids or aren't familiar with their effects, you should ask Dr Contrina Orona for additional details.  Diabetics shouyld plan to check their blood sugar more frequently in the 2 days after the injection.  The injection takes approximately 1 minute per side, and\ it is performed during a routine office visit. There are no precautions afterward except for not swallowing for 1 hour if throat numbness occurs. Patients can drive themselves to and from the visit. There is no recovery time, but some patients have mild bruising or tenderness at the injection site.

## 2024-06-20 ENCOUNTER — Encounter (INDEPENDENT_AMBULATORY_CARE_PROVIDER_SITE_OTHER): Payer: Self-pay | Admitting: Otolaryngology

## 2024-06-27 ENCOUNTER — Ambulatory Visit: Attending: Otolaryngology

## 2024-06-27 ENCOUNTER — Other Ambulatory Visit: Payer: Self-pay

## 2024-06-27 DIAGNOSIS — J383 Other diseases of vocal cords: Secondary | ICD-10-CM | POA: Insufficient documentation

## 2024-06-27 DIAGNOSIS — R053 Chronic cough: Secondary | ICD-10-CM | POA: Diagnosis not present

## 2024-06-27 DIAGNOSIS — R490 Dysphonia: Secondary | ICD-10-CM | POA: Insufficient documentation

## 2024-06-27 DIAGNOSIS — R498 Other voice and resonance disorders: Secondary | ICD-10-CM | POA: Insufficient documentation

## 2024-06-27 NOTE — Therapy (Unsigned)
 OUTPATIENT SPEECH LANGUAGE PATHOLOGY VOICE EVALUATION   Patient Name: JACE DOWE MRN: [id number] DOB:Oct 19, 1951, 73 y.o., female Today's Date: 06/27/2024  PCP: Micheal Pin, MD REFERRING PROVIDER: Okey Burns, MD  END OF SESSION:  End of Session - 06/27/24 1226     Visit Number 1    Number of Visits 17    Date for SLP Re-Evaluation 08/26/24    SLP Start Time 1024   pt checked in 1021   SLP Stop Time  1106    SLP Time Calculation (min) 42 min    Activity Tolerance Patient tolerated treatment well          Past Medical History:  Diagnosis Date   Anxiety    Breast cancer (HCC) 2009   right   Breast cancer (HCC) 2021   Cancer (HCC)    COUGH, CHRONIC 05/10/2010   Dysrhythmia    occationally skips a beat   Family history of brain cancer    Family history of breast cancer    Family history of lung cancer    Family history of multiple myeloma    Family history of skin cancer    GERD (gastroesophageal reflux disease)    HYPERTENSION 04/16/2009   HYPOTHYROIDISM 04/16/2009   OSTEOARTHRITIS 04/16/2009   Personal history of chemotherapy 2021   Personal history of radiation therapy 2009   SENILE LENTIGO 06/10/2010   Past Surgical History:  Procedure Laterality Date   BREAST EXCISIONAL BIOPSY Right    BREAST EXCISIONAL BIOPSY Left    BREAST LUMPECTOMY Right 2009   radiation only   BREAST LUMPECTOMY WITH RADIOACTIVE SEED AND SENTINEL LYMPH NODE BIOPSY Right 01/31/2020   Procedure: RIGHT BREAST LUMPECTOMY WITH RADIOACTIVE SEED AND SENTINEL LYMPH NODE BIOPSY;  Surgeon: Vanderbilt Ned, MD;  Location: [facility];  Service: General;  Laterality: Right;  PEC BLOCK   BREAST SURGERY  2009   X 2, cancer stage 0, lumpectomy   CESAREAN SECTION     1979, 1982   CHOLECYSTECTOMY     COLONOSCOPY     DILATATION & CURETTAGE/HYSTEROSCOPY WITH MYOSURE  11/2015   HERNIA REPAIR  2009   JOINT REPLACEMENT  2008   hip   MASTECTOMY Right 2021   OPEN SURGICAL  REPAIR OF GLUTEAL TENDON Left 07/18/2019   Procedure: Left hip bearing surface revision with gluteal tendon repair;  Surgeon: Melodi Lerner, MD;  Location: [facility];  Service: Orthopedics;  Laterality: Left;  2 hrs   PORTACATH PLACEMENT N/A 03/15/2020   Procedure: INSERTION PORT-A-CATH WITH ULTRASOUND GUIDANCE;  Surgeon: Vanderbilt Ned, MD;  Location: [facility];  Service: General;  Laterality: N/A;   SIMPLE MASTECTOMY WITH AXILLARY SENTINEL NODE BIOPSY Right 03/15/2020   Procedure: RIGHT SIMPLE MASTECTOMY;  Surgeon: Vanderbilt Ned, MD;  Location: [facility];  Service: General;  Laterality: Right;   Patient Active Problem List   Diagnosis Date Noted   Port-A-Cath in place 05/15/2020   Genetic testing 02/03/2020   Family history of breast cancer    Family history of skin cancer    Family history of brain cancer    Family history of lung cancer    Family history of multiple myeloma    Malignant neoplasm of upper-outer quadrant of right breast in female, estrogen receptor positive (HCC) 01/04/2020   Melanoma of skin (HCC) 08/15/2019   Failed total hip arthroplasty, 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 It's worse with environmental factors.   Pt accompanied by: self  PERTINENT HISTORY:  From [place]'s note 06/17/24: Keerthi Hazell is a 73 year old female with chronic cough who presents with persistent cough and voice changes. She was referred by Dr. Fleeta Smock Allergies for evaluation of her chronic cough for several years and voice changes/dysphonia for several months   She has experienced a chronic cough for many years, which worsened after an illness in December 2024. She also experienced persistent hoarseness at the same time, and has had it since. The cough is associated with phlegm production, sometimes green in color, and occasional throat discomfort. A chest x-ray performed in May was normal. She has a history of asthma and has been prescribed an emergency inhaler, which she has not needed to use in the past month and a half. During her worst symptoms, she coughed up significant amounts of phlegm, which has since decreased but still occurs. She occasionally experiences throat pain, described as being lower in the throat, and has had episodes of difficulty swallowing, though these are not frequent.   Her voice has changed since December. She has a history of allergies and has been receiving allergy shots for years, though she stopped in May due to illness and antibiotic use. She has tried various antihistamines, including Zyrtec and Claritin, without significant relief. She performs daily nasal saline rinses and has a bottle of Flonase .    In the past, she consulted a pulmonologist who diagnosed her with a cyclical cough and provided treatment, but the cough returned. This was approximately seven or eight years ago.  PAIN:  Are you having pain? No  FALLS: Has patient fallen in last 6 months? No  LIVING ENVIRONMENT: Lives with: lives with their spouse Lives in: House/apartment  PLOF:Level of assistance: Independent with ADLs, Independent with IADLs Employment: Part-time employment realtor  PATIENT GOALS: Address voice concerns  OBJECTIVE:  Note: Objective measures were completed at Evaluation  unless otherwise noted.  DIAGNOSTIC FINDINGS:  [place] 06/17/24: Procedure: Preoperative diagnosis: hoarseness   Postoperative diagnosis:   same VF atrophy and glottic insufficiency   Procedure: Flexible fiberoptic laryngoscopy with stroboscopy (68420)    Surgeon: Elena Larry, MD   Anesthesia: Topical lidocaine  and Afrin   Complications: None   Condition is stable throughout exam   Indications and consent:   The patient presents to the clinic with hoarseness. All the risks, benefits, and potential complications were reviewed with the patient preoperatively and informed verbal consent was obtained.   Procedure: The patient was seated upright in the exam chair.   Topical lidocaine  and Afrin were applied to the nasal cavity. After adequate anesthesia had occurred, the flexible telescope with strobe capabilities was passed into the nasal cavity. The nasopharynx was patent without mass or lesion. The scope was passed behind the soft palate and directed toward the base of tongue. The base of tongue was visualized and was symmetric with no apparent masses or abnormal appearing tissue. There were no signs of a mass or pooling of  secretions in the piriform sinuses. The supraglottic structures were normal.   The true vocal cords are mobile. The medial edges were straight. Closure was complete but with severe supraglottic compression. Periodicity present. The mucosal wave and amplitude were slightly decreased 2/2 straight and supraglottic compression. There is moder ate interarytenoid pachydermia and post cricoid edema. The mucosa appears without lesions.   The laryngoscope was then slowly withdrawn and the patient tolerated the procedure well. There were no complications or blood loss.  Assessment & Plan Chronic cough We discussed common causes of chronic cough and neurogenic cough. Previous treatments for post-nasal drainage and reflux were ineffective. Was seen by Pulm before and diagnosed  with cyclical cough. Normal chest x-ray 03/2024. - Review superior laryngeal nerve block information. - Xyzal  5 mg daily and Flonase , and nasal rinses daily - Consider superior laryngeal nerve block if symptoms persist.   Environmental allergies and Post-nasal drainage  Postnasal drip contributing to chronic cough. Previous treatments ineffective. - Continue nasal saline rinses. - Start Flonase  nasal spray BID - Prescribed Xyzal . - continue allergy shots    Dysphonia Hoarseness x 7 months Strobe today with supraglottic compression, VF atrophy and glottic insufficiency. We discussed a trial of speech therapy - Refer to speech therapy.   RTC 3 months  COGNITION: Overall cognitive status: Within functional limits for tasks assessed  SOCIAL HISTORY: Occupation: Child psychotherapist intake: optimal - approx 60oz/daily Caffeine/alcohol intake: moderate approx 24 oz/daily Daily voice use: minimal  PERCEPTUAL VOICE ASSESSMENT: Voice quality: rough Vocal abuse: chronic cough dx (has environmental components as well -like VCD) Resonance: normal Respiratory function: thoracic breathing  OBJECTIVE VOICE ASSESSMENT: Maximum phonation time for sustained ah: 10.75 (lower than WNL) Conversational pitch average: 152 Hz (below WNL) Conversational pitch range: 80-302 Hz Conversational loudness average: 70 dB (WNL) Conversational loudness range: 52084 dB S/z ratio: 1.2 (Suggestive of dysfunction >1.0)  PATIENT REPORTED OUTCOME MEASURES (PROM): Leicester Cough Index: provided in first 1-2 sessions                                                                                                                            TREATMENT DATE: ***   PATIENT EDUCATION: Education details: *** Person educated: {Person educated:25204} Education method: {Education Method:25205} Education comprehension: {Education Comprehension:25206}  HOME EXERCISE PROGRAM: ***  GOALS: Goals reviewed with patient?  {yes/no:20286}  SHORT TERM GOALS: Target date: ***  *** Baseline: Goal status: INITIAL  2.  *** Baseline:  Goal status: INITIAL  3.  *** Baseline:  Goal status: INITIAL  4.  *** Baseline:  Goal status: INITIAL  5.  *** Baseline:  Goal status: INITIAL  6.  *** Baseline:  Goal status: INITIAL  LONG TERM GOALS: Target date: ***  *** Baseline:  Goal status: INITIAL  2.  *** Baseline:  Goal status: INITIAL  3.  *** Baseline:  Goal status: INITIAL  4.  *** Baseline:  Goal status: INITIAL  5.  *** Baseline:  Goal status:  INITIAL  6.  *** Baseline:  Goal status: INITIAL  ASSESSMENT:  CLINICAL IMPRESSION: Patient is a *** y.o. *** who was seen today for ***.   OBJECTIVE IMPAIRMENTS: include {SLPOBJIMP:27107}. These impairments are limiting patient from {SLPLIMIT:27108}. Factors affecting potential to achieve goals and functional outcome are {SLP factors:25450}.. Patient will benefit from skilled SLP services to address above impairments and improve overall function.  REHAB POTENTIAL: {rehabpotential:25112}  PLAN:  SLP FREQUENCY: {rehab frequency:25116}  SLP DURATION: {rehab duration:25117}  PLANNED INTERVENTIONS: {SLP treatment/interventions:25449}    Trust Crago, CCC-SLP 06/27/2024, 12:28 PM

## 2024-06-30 ENCOUNTER — Ambulatory Visit

## 2024-06-30 DIAGNOSIS — R498 Other voice and resonance disorders: Secondary | ICD-10-CM

## 2024-06-30 DIAGNOSIS — R490 Dysphonia: Secondary | ICD-10-CM | POA: Diagnosis not present

## 2024-06-30 DIAGNOSIS — R053 Chronic cough: Secondary | ICD-10-CM | POA: Diagnosis not present

## 2024-06-30 DIAGNOSIS — J383 Other diseases of vocal cords: Secondary | ICD-10-CM | POA: Diagnosis not present

## 2024-06-30 NOTE — Therapy (Signed)
 OUTPATIENT SPEECH LANGUAGE PATHOLOGY VOICE TREATMENT   Patient Name: Theresa Bradley MRN: [id number] DOB:November 22, 1951, 73 y.o., female Today's Date: 06/30/2024  PCP: Micheal Pin, MD REFERRING PROVIDER: Okey Burns, MD  END OF SESSION:  End of Session - 06/30/24 1733     Visit Number 2    Number of Visits 17    Date for SLP Re-Evaluation 08/26/24    SLP Start Time 1535    SLP Stop Time  1615    SLP Time Calculation (min) 40 min    Activity Tolerance Patient tolerated treatment well           Past Medical History:  Diagnosis Date   Anxiety    Breast cancer (HCC) 2009   right   Breast cancer (HCC) 2021   Cancer (HCC)    COUGH, CHRONIC 05/10/2010   Dysrhythmia    occationally skips a beat   Family history of brain cancer    Family history of breast cancer    Family history of lung cancer    Family history of multiple myeloma    Family history of skin cancer    GERD (gastroesophageal reflux disease)    HYPERTENSION 04/16/2009   HYPOTHYROIDISM 04/16/2009   OSTEOARTHRITIS 04/16/2009   Personal history of chemotherapy 2021   Personal history of radiation therapy 2009   SENILE LENTIGO 06/10/2010   Past Surgical History:  Procedure Laterality Date   BREAST EXCISIONAL BIOPSY Right    BREAST EXCISIONAL BIOPSY Left    BREAST LUMPECTOMY Right 2009   radiation only   BREAST LUMPECTOMY WITH RADIOACTIVE SEED AND SENTINEL LYMPH NODE BIOPSY Right 01/31/2020   Procedure: RIGHT BREAST LUMPECTOMY WITH RADIOACTIVE SEED AND SENTINEL LYMPH NODE BIOPSY;  Surgeon: Vanderbilt Ned, MD;  Location: [facility];  Service: General;  Laterality: Right;  PEC BLOCK   BREAST SURGERY  2009   X 2, cancer stage 0, lumpectomy   CESAREAN SECTION     1979, 1982   CHOLECYSTECTOMY     COLONOSCOPY     DILATATION & CURETTAGE/HYSTEROSCOPY WITH MYOSURE  11/2015   HERNIA REPAIR  2009   JOINT REPLACEMENT  2008   hip   MASTECTOMY Right 2021   OPEN SURGICAL REPAIR OF GLUTEAL  TENDON Left 07/18/2019   Procedure: Left hip bearing surface revision with gluteal tendon repair;  Surgeon: Melodi Lerner, MD;  Location: [facility];  Service: Orthopedics;  Laterality: Left;  2 hrs   PORTACATH PLACEMENT N/A 03/15/2020   Procedure: INSERTION PORT-A-CATH WITH ULTRASOUND GUIDANCE;  Surgeon: Vanderbilt Ned, MD;  Location: [facility];  Service: General;  Laterality: N/A;   SIMPLE MASTECTOMY WITH AXILLARY SENTINEL NODE BIOPSY Right 03/15/2020   Procedure: RIGHT SIMPLE MASTECTOMY;  Surgeon: Vanderbilt Ned, MD;  [facility];  Service: General;  Laterality: Right;   Patient Active Problem List   Diagnosis Date Noted   Port-A-Cath in place 05/15/2020   Genetic testing 02/03/2020   Family history of breast cancer    Family history of skin cancer    Family history of brain cancer    Family history of lung cancer    Family history of multiple myeloma    Malignant neoplasm of upper-outer quadrant of right breast in female, estrogen receptor positive (HCC) 01/04/2020   Melanoma of skin (HCC) 08/15/2019   Failed total hip arthroplasty, 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   Cough 05/31/2014   GERD (gastroesophageal reflux disease) 09/13/2012   Hypothyroidism 04/16/2009  Essential hypertension 04/16/2009   Osteoarthritis 04/16/2009    Onset date: years ago script dated 06/17/24  REFERRING DIAG: R05.3 (ICD-10-CM) - Chronic cough R49.0 (ICD-10-CM) - Dysphonia 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   Pt accompanied by: self  PERTINENT HISTORY:  From [place]'s note 06/17/24: Theresa Bradley is a 73 year old female with  chronic cough who presents with persistent cough and voice changes. She was referred by Dr. Fleeta Smock Allergies for evaluation of her chronic cough for several years and voice changes/dysphonia for several months   She has experienced a chronic cough for many years, which worsened after an illness in December 2024. She also experienced persistent hoarseness at the same time, and has had it since. The cough is associated with phlegm production, sometimes green in color, and occasional throat discomfort. A chest x-ray performed in May was normal. She has a history of asthma and has been prescribed an emergency inhaler, which she has not needed to use in the past month and a half. During her worst symptoms, she coughed up significant amounts of phlegm, which has since decreased but still occurs. She occasionally experiences throat pain, described as being lower in the throat, and has had episodes of difficulty swallowing, though these are not frequent.   Her voice has changed since December. She has a history of allergies and has been receiving allergy shots for years, though she stopped in May due to illness and antibiotic use. She has tried various antihistamines, including Zyrtec and Claritin, without significant relief. She performs daily nasal saline rinses and has a bottle of Flonase .    In the past, she consulted a pulmonologist who diagnosed her with a cyclical cough and provided treatment, but the cough returned. This was approximately seven or eight years ago.  PAIN:  Are you having pain? No  FALLS: Has patient fallen in last 6 months? No  PATIENT GOALS: Address voice concerns  OBJECTIVE:  Note: Objective measures were completed at Evaluation unless otherwise noted.  DIAGNOSTIC FINDINGS:  [place] 06/17/24: Procedure: Preoperative diagnosis: hoarseness   Postoperative diagnosis:   same VF atrophy and glottic insufficiency   Procedure: Flexible fiberoptic laryngoscopy with stroboscopy  (68420)    Surgeon: Elena Larry, MD   Anesthesia: Topical lidocaine  and Afrin   Complications: None   Condition is stable throughout exam   Indications and consent:   The patient presents to the clinic with hoarseness. All the risks, benefits, and potential complications were reviewed with the patient preoperatively and informed verbal consent was obtained.   Procedure: The patient was seated upright in the exam chair.   Topical lidocaine  and Afrin were applied to the nasal cavity. After adequate anesthesia had occurred, the flexible telescope with strobe capabilities was passed into the nasal cavity. The nasopharynx was patent without mass or lesion. The scope was passed behind the soft palate and directed toward the base of tongue. The base of tongue was visualized and was symmetric with no apparent masses or abnormal appearing tissue. There were no signs of a mass or pooling of secretions in the piriform sinuses. The supraglottic structures were normal.   The true vocal cords are mobile. The medial edges were straight. Closure was complete but with severe supraglottic compression. Periodicity  present. The mucosal wave and amplitude were slightly decreased 2/2 straight and supraglottic compression. There is moder ate interarytenoid pachydermia and post cricoid edema. The mucosa appears without lesions.   The laryngoscope was then slowly withdrawn and the patient tolerated the procedure well. There were no complications or blood loss.  Assessment & Plan Chronic cough We discussed common causes of chronic cough and neurogenic cough. Previous treatments for post-nasal drainage and reflux were ineffective. Was seen by Pulm before and diagnosed with cyclical cough. Normal chest x-ray 03/2024. - Review superior laryngeal nerve block information. - Xyzal  5 mg daily and Flonase , and nasal rinses daily - Consider superior laryngeal nerve block if symptoms persist.   Environmental allergies and  Post-nasal drainage  Postnasal drip contributing to chronic cough. Previous treatments ineffective. - Continue nasal saline rinses. - Start Flonase  nasal spray BID - Prescribed Xyzal . - continue allergy shots    Dysphonia Hoarseness x 7 months Strobe today with supraglottic compression, VF atrophy and glottic insufficiency. We discussed a trial of speech therapy - Refer to speech therapy.   RTC 3 months   PATIENT REPORTED OUTCOME MEASURES (PROM): Leicester Cough Index: provided in first 1-2 sessions                                                                                                                            TREATMENT DATE:  AB= Abdominal breathing; SOVTE=Semi-occluded vocal tract exercises, VFE=Vocal function exercises  06/30/24: SLP educated pt on throat clear alternatives; pt began using water sips after two additional throat clears. SLP guided pt through strategies for chronic cough. SLP then started AB practice, which triggered chronic cough. SLP then guided pt through strategies for chronic cough in the moment, which quelled cough in 90 seconds. Homework to work on AB and to practice chronic cough strategies when not in coughing spell.   06/27/24: Not charged due to not authorized by insurance. SLP educated pt about AB and watched pt as she attempted AB. At rest pt began at 50% success but with occasional min A she incr'd success to 80%. She was told to practice this at home 10-15 minutes BID.  PATIENT EDUCATION: Education details: see treatment date this date, above Person educated: Patient Education method: Programmer, multimedia, Demonstration, and Verbal cues Education comprehension: verbalized understanding, returned demonstration, verbal cues required, and needs further education  HOME EXERCISE PROGRAM: AB but will eventually include SOVTE  GOALS: Goals reviewed with patient? Yes  SHORT TERM GOALS: Target date: 07/29/24  Pt will complete AB at rest with 85% success for  5 minutes, in 2 sessions Baseline: Goal status: INITIAL  2.  Pt will complete AB in at least 17/20 sentence responses, in two sessions Baseline:  Goal status: INITIAL  3.  To decr laryngeal compression, pt will complete SOVTE tasks in session with 90% success in 2 sessions Baseline:  Goal status: INITIAL   LONG TERM GOALS: Target date: 08/26/24  Pt will improve PROM from initial  administration Baseline:  Goal status: INITIAL  2.  Pt will reduce throat clears to <3 in 3 sessions, using compensations PRN Baseline:  Goal status: INITIAL  3.  Pt will exhibit WNL voice quality for 1 minute following SOVTE or other voice exercises in 2 sessions Baseline:  Goal status: INITIAL  4.  Pt will exhibit WNL voice quality for 5 minutes following SOVTE or other voice exercises in 3 sessions Baseline:  Goal status: INITIAL  5.  Pt will report being able to talk on the phone for 30 minutes without coughing episodes, between 2 sessions, using compensations PRN  Baseline:  Goal status: INITIAL   ASSESSMENT:  CLINICAL IMPRESSION: Patient is a 73 y.o. F who was seen today for treatment of voice. She demonstrated a rough voice today. Strategies for chronic cough were reviewed today. After practicing with AB for approx 1 minute pt began to cough - a congested-sounding cough at first, but then was more of a dry cough which pt said was indicative of her chronic cough. This made communication more difficult as pt was coughing every 15-30 seconds for 4-5 minutes. Today pt had 8 throat clears and 19 coughs in 40 minute eval. She will need skilled ST both for chronic cough and for improving voice quality due to vocal fold atrophy and laryngeal compression.  OBJECTIVE IMPAIRMENTS: include voice disorder. These impairments are limiting patient from managing appointments, household responsibilities, ADLs/IADLs, and effectively communicating at home and in community. Factors affecting potential to achieve  goals and functional outcome are severity of impairments.. Patient will benefit from skilled SLP services to address above impairments and improve overall function.  REHAB POTENTIAL: Good  PLAN:  SLP FREQUENCY: 2x/week  SLP DURATION: 8 weeks  PLANNED INTERVENTIONS: Internal/external aids, Functional tasks, SLP instruction and feedback, Compensatory strategies, Patient/family education, [phone] Treatment of speech (30 or 45 min) , and voice exercises    Gittel Mccamish, CCC-SLP 06/30/2024, 5:33 PM

## 2024-06-30 NOTE — Patient Instructions (Addendum)
   What to Do Instead of Clearing Your Throat Use these strategies instead of clearing your throat: 1. Swallow your saliva. Swallow as hard as you can.  2. Take a drink of water. 3. Suck on ice chips. 4. Use a silent cough. Whisper the word "huh" from your belly without making a sound and then swallow. This is like a cough but without using your voice. 5. Hum on an "M" and then swallow. 6. Use a light, gentle cough (like tapping your vocal folds together) and then swallow. 7. Silently count to 10 and then swallow    A handout was provided about chronic cough strategies.

## 2024-07-13 ENCOUNTER — Ambulatory Visit

## 2024-07-13 DIAGNOSIS — J383 Other diseases of vocal cords: Secondary | ICD-10-CM | POA: Diagnosis not present

## 2024-07-13 DIAGNOSIS — R498 Other voice and resonance disorders: Secondary | ICD-10-CM | POA: Diagnosis not present

## 2024-07-13 DIAGNOSIS — R490 Dysphonia: Secondary | ICD-10-CM | POA: Diagnosis not present

## 2024-07-13 DIAGNOSIS — R053 Chronic cough: Secondary | ICD-10-CM | POA: Diagnosis not present

## 2024-07-13 NOTE — Patient Instructions (Signed)
 SAY THESE PHRASES (INTERMIX THE WORDS FOR ANY PHRASE) FOR 5-7 MINUTES, TWICE A DAY  MY MOON  NEW NEWS  WHO'S NEWS  NEW SHOES  WHO'S NEW MOON  MOON NEWS  MY NEWS  MY MOON NEWS  MY NEW MOON SHOES

## 2024-07-13 NOTE — Therapy (Signed)
 OUTPATIENT SPEECH LANGUAGE PATHOLOGY VOICE TREATMENT   Patient Name: Theresa Bradley MRN: [id number] DOB:05-26-1951, 73 y.o., female Today's Date: 07/13/2024  PCP: Theresa Pin, MD REFERRING PROVIDER: Okey Burns, MD  END OF SESSION:  End of Session - 07/13/24 1325     Visit Number 3    Number of Visits 17    Date for SLP Re-Evaluation 08/26/24    SLP Start Time 1322    SLP Stop Time  1402    SLP Time Calculation (min) 40 min    Activity Tolerance Patient tolerated treatment well           Past Medical History:  Diagnosis Date   Anxiety    Breast cancer (HCC) 2009   right   Breast cancer (HCC) 2021   Cancer (HCC)    COUGH, CHRONIC 05/10/2010   Dysrhythmia    occationally skips a beat   Family history of brain cancer    Family history of breast cancer    Family history of lung cancer    Family history of multiple myeloma    Family history of skin cancer    GERD (gastroesophageal reflux disease)    HYPERTENSION 04/16/2009   HYPOTHYROIDISM 04/16/2009   OSTEOARTHRITIS 04/16/2009   Personal history of chemotherapy 2021   Personal history of radiation therapy 2009   SENILE LENTIGO 06/10/2010   Past Surgical History:  Procedure Laterality Date   BREAST EXCISIONAL BIOPSY Right    BREAST EXCISIONAL BIOPSY Left    BREAST LUMPECTOMY Right 2009   radiation only   BREAST LUMPECTOMY WITH RADIOACTIVE SEED AND SENTINEL LYMPH NODE BIOPSY Right 01/31/2020   Procedure: RIGHT BREAST LUMPECTOMY WITH RADIOACTIVE SEED AND SENTINEL LYMPH NODE BIOPSY;  Surgeon: Theresa Ned, MD;  Location: [facility];  Service: General;  Laterality: Right;  PEC BLOCK   BREAST SURGERY  2009   X 2, cancer stage 0, lumpectomy   CESAREAN SECTION     1979, 1982   CHOLECYSTECTOMY     COLONOSCOPY     DILATATION & CURETTAGE/HYSTEROSCOPY WITH MYOSURE  11/2015   HERNIA REPAIR  2009   JOINT REPLACEMENT  2008   hip   MASTECTOMY Right 2021   OPEN SURGICAL REPAIR OF GLUTEAL  TENDON Left 07/18/2019   Procedure: Left hip bearing surface revision with gluteal tendon repair;  Surgeon: Theresa Lerner, MD;  Location: [facility];  Service: Orthopedics;  Laterality: Left;  2 hrs   PORTACATH PLACEMENT N/A 03/15/2020   Procedure: INSERTION PORT-A-CATH WITH ULTRASOUND GUIDANCE;  Surgeon: Theresa Ned, MD;  Location: [facility];  Service: General;  Laterality: N/A;   SIMPLE MASTECTOMY WITH AXILLARY SENTINEL NODE BIOPSY Right 03/15/2020   Procedure: RIGHT SIMPLE MASTECTOMY;  Surgeon: Theresa Ned, MD;  Location: [facility];  Service: General;  Laterality: Right;   Patient Active Problem List   Diagnosis Date Noted   Port-A-Cath in place 05/15/2020   Genetic testing 02/03/2020   Family history of breast cancer    Family history of skin cancer    Family history of brain cancer    Family history of lung cancer    Family history of multiple myeloma    Malignant neoplasm of upper-outer quadrant of right breast in female, estrogen receptor positive (HCC) 01/04/2020   Melanoma of skin (HCC) 08/15/2019   Failed total hip arthroplasty, 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   Cough 05/31/2014   GERD (gastroesophageal reflux disease) 09/13/2012   Hypothyroidism 04/16/2009  Essential hypertension 04/16/2009   Osteoarthritis 04/16/2009    Onset date: years ago script dated 06/17/24  REFERRING DIAG: R05.3 (ICD-10-CM) - Chronic cough R49.0 (ICD-10-CM) - Dysphonia 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   Pt accompanied by: self  PERTINENT HISTORY:  From [place]'s note 06/17/24: [name] is a 73 year old female  with chronic cough who presents with persistent cough and voice changes. She was referred by Dr. Fleeta Smock Allergies for evaluation of her chronic cough for several years and voice changes/dysphonia for several months   She has experienced a chronic cough for many years, which worsened after an illness in December 2024. She also experienced persistent hoarseness at the same time, and has had it since. The cough is associated with phlegm production, sometimes green in color, and occasional throat discomfort. A chest x-ray performed in May was normal. She has a history of asthma and has been prescribed an emergency inhaler, which she has not needed to use in the past month and a half. During her worst symptoms, she coughed up significant amounts of phlegm, which has since decreased but still occurs. She occasionally experiences throat pain, described as being lower in the throat, and has had episodes of difficulty swallowing, though these are not frequent.   Her voice has changed since December. She has a history of allergies and has been receiving allergy shots for years, though she stopped in May due to illness and antibiotic use. She has tried various antihistamines, including Zyrtec and Claritin, without significant relief. She performs daily nasal saline rinses and has a bottle of Flonase .    In the past, she consulted a pulmonologist who diagnosed her with a cyclical cough and provided treatment, but the cough returned. This was approximately seven or eight years ago.  PAIN:  Are you having pain? No  FALLS: Has patient fallen in last 6 months? No  PATIENT GOALS: Address voice concerns  OBJECTIVE:  Note: Objective measures were completed at Evaluation unless otherwise noted.  DIAGNOSTIC FINDINGS:  [place] 06/17/24: Procedure: Preoperative diagnosis: hoarseness   Postoperative diagnosis:   same VF atrophy and glottic insufficiency   Procedure: Flexible fiberoptic laryngoscopy with  stroboscopy (68420)    Surgeon: Elena Larry, MD   Anesthesia: Topical lidocaine  and Afrin   Complications: None   Condition is stable throughout exam   Indications and consent:   The patient presents to the clinic with hoarseness. All the risks, benefits, and potential complications were reviewed with the patient preoperatively and informed verbal consent was obtained.   Procedure: The patient was seated upright in the exam chair.   Topical lidocaine  and Afrin were applied to the nasal cavity. After adequate anesthesia had occurred, the flexible telescope with strobe capabilities was passed into the nasal cavity. The nasopharynx was patent without mass or lesion. The scope was passed behind the soft palate and directed toward the base of tongue. The base of tongue was visualized and was symmetric with no apparent masses or abnormal appearing tissue. There were no signs of a mass or pooling of secretions in the piriform sinuses. The supraglottic structures were normal.   The true vocal cords are mobile. The medial edges were straight. Closure was complete but with severe supraglottic compression. Periodicity present.  The mucosal wave and amplitude were slightly decreased 2/2 straight and supraglottic compression. There is moder ate interarytenoid pachydermia and post cricoid edema. The mucosa appears without lesions.   The laryngoscope was then slowly withdrawn and the patient tolerated the procedure well. There were no complications or blood loss.  Assessment & Plan Chronic cough We discussed common causes of chronic cough and neurogenic cough. Previous treatments for post-nasal drainage and reflux were ineffective. Was seen by Pulm before and diagnosed with cyclical cough. Normal chest x-ray 03/2024. - Review superior laryngeal nerve block information. - Xyzal  5 mg daily and Flonase , and nasal rinses daily - Consider superior laryngeal nerve block if symptoms persist.   Environmental  allergies and Post-nasal drainage  Postnasal drip contributing to chronic cough. Previous treatments ineffective. - Continue nasal saline rinses. - Start Flonase  nasal spray BID - Prescribed Xyzal . - continue allergy shots    Dysphonia Hoarseness x 7 months Strobe today with supraglottic compression, VF atrophy and glottic insufficiency. We discussed a trial of speech therapy - Refer to speech therapy.   RTC 3 months   PATIENT REPORTED OUTCOME MEASURES (PROM): [place] Cough Index: provided 07/13/24 AND ASKED TO RETURN NEXT SESSION                                                                                                                            TREATMENT DATE:  AB= Abdominal breathing; SOVTE=Semi-occluded vocal tract exercises, VFE=Vocal function exercises  07/13/24: SLP worked with pt today with SOVTE and flow phonation exercises to reduce tension/compression when vocalizing. SLP used /m/, /n/, /u/, and /o/ words and pt initially with 30% improving to 85% success with WNL voicing with pitch variations. Still rough sounding in lower pitches - possibly due to vocal fold atrophy? Pt will practice phrases  at home as in pt instructions.   06/30/24: SLP educated pt on throat clear alternatives; pt began using water sips after two additional throat clears. SLP guided pt through strategies for chronic cough. SLP then started AB practice, which triggered chronic cough. SLP then guided pt through strategies for chronic cough in the moment, which quelled cough in 90 seconds. Homework to work on AB and to practice chronic cough strategies when not in coughing spell.   06/27/24: Not charged due to not authorized by insurance. SLP educated pt about AB and watched pt as she attempted AB. At rest pt began at 50% success but with occasional min A she incr'd success to 80%. She was told to practice this at home 10-15 minutes BID.  PATIENT EDUCATION: Education details: see treatment date this date,  above Person educated: Patient Education method: Programmer, multimedia, Demonstration, and Verbal cues Education comprehension: verbalized understanding, returned demonstration, verbal cues required, and needs further education  HOME EXERCISE PROGRAM: AB but will eventually include SOVTE  GOALS: Goals reviewed with patient? Yes  SHORT TERM GOALS: Target date: 07/29/24  Pt will complete AB at rest with 85% success for 5 minutes,  in 2 sessions Baseline: Goal status: INITIAL  2.  Pt will complete AB in at least 17/20 sentence responses, in two sessions Baseline:  Goal status: INITIAL  3.  To decr laryngeal compression, pt will complete SOVTE tasks in session with 90% success in 2 sessions Baseline:  Goal status: INITIAL   LONG TERM GOALS: Target date: 08/26/24  Pt will improve PROM from initial administration Baseline:  Goal status: INITIAL  2.  Pt will reduce throat clears to <3 in 3 sessions, using compensations PRN Baseline:  Goal status: INITIAL  3.  Pt will exhibit WNL voice quality for 1 minute following SOVTE or other voice exercises in 2 sessions Baseline:  Goal status: INITIAL  4.  Pt will exhibit WNL voice quality for 5 minutes following SOVTE or other voice exercises in 3 sessions Baseline:  Goal status: INITIAL  5.  Pt will report being able to talk on the phone for 30 minutes without coughing episodes, between 2 sessions, using compensations PRN  Baseline:  Goal status: INITIAL   ASSESSMENT:  CLINICAL IMPRESSION: Patient is a 73 y.o. F who was seen today for treatment of voice. She demonstrated a rough voice today upon entering ST room. Today pt had 3 throat clears and 6 coughs in the session. She will need skilled ST both for chronic cough and for improving voice quality due to vocal fold atrophy and laryngeal compression.  OBJECTIVE IMPAIRMENTS: include voice disorder. These impairments are limiting patient from managing appointments, household responsibilities,  ADLs/IADLs, and effectively communicating at home and in community. Factors affecting potential to achieve goals and functional outcome are severity of impairments.. Patient will benefit from skilled SLP services to address above impairments and improve overall function.  REHAB POTENTIAL: Good  PLAN:  SLP FREQUENCY: 2x/week  SLP DURATION: 8 weeks  PLANNED INTERVENTIONS: Internal/external aids, Functional tasks, SLP instruction and feedback, Compensatory strategies, Patient/family education, [phone] Treatment of speech (30 or 45 min) , and voice exercises    Lita Flynn, CCC-SLP 07/13/2024, 1:25 PM

## 2024-07-20 ENCOUNTER — Ambulatory Visit

## 2024-07-20 DIAGNOSIS — R498 Other voice and resonance disorders: Secondary | ICD-10-CM

## 2024-07-20 DIAGNOSIS — R053 Chronic cough: Secondary | ICD-10-CM | POA: Diagnosis not present

## 2024-07-20 DIAGNOSIS — R490 Dysphonia: Secondary | ICD-10-CM | POA: Diagnosis not present

## 2024-07-20 DIAGNOSIS — J383 Other diseases of vocal cords: Secondary | ICD-10-CM | POA: Diagnosis not present

## 2024-07-20 NOTE — Therapy (Signed)
 OUTPATIENT SPEECH LANGUAGE PATHOLOGY VOICE TREATMENT   Patient Name: Theresa Bradley MRN: [id number] DOB:1951/09/09, 73 y.o., female Today's Date: 07/20/2024  PCP: Theresa Pin, MD REFERRING PROVIDER: Okey Burns, MD  END OF SESSION:  End of Session - 07/20/24 1107     Visit Number 4    Number of Visits 17    Date for SLP Re-Evaluation 08/26/24    SLP Start Time 1106    SLP Stop Time  1146    SLP Time Calculation (min) 40 min    Activity Tolerance Patient tolerated treatment well           Past Medical History:  Diagnosis Date   Anxiety    Breast cancer (HCC) 2009   right   Breast cancer (HCC) 2021   Cancer (HCC)    COUGH, CHRONIC 05/10/2010   Dysrhythmia    occationally skips a beat   Family history of brain cancer    Family history of breast cancer    Family history of lung cancer    Family history of multiple myeloma    Family history of skin cancer    GERD (gastroesophageal reflux disease)    HYPERTENSION 04/16/2009   HYPOTHYROIDISM 04/16/2009   OSTEOARTHRITIS 04/16/2009   Personal history of chemotherapy 2021   Personal history of radiation therapy 2009   SENILE LENTIGO 06/10/2010   Past Surgical History:  Procedure Laterality Date   BREAST EXCISIONAL BIOPSY Right    BREAST EXCISIONAL BIOPSY Left    BREAST LUMPECTOMY Right 2009   radiation only   BREAST LUMPECTOMY WITH RADIOACTIVE SEED AND SENTINEL LYMPH NODE BIOPSY Right 01/31/2020   Procedure: RIGHT BREAST LUMPECTOMY WITH RADIOACTIVE SEED AND SENTINEL LYMPH NODE BIOPSY;  Surgeon: Theresa Ned, MD;  Location: [facility];  Service: General;  Laterality: Right;  PEC BLOCK   BREAST SURGERY  2009   X 2, cancer stage 0, lumpectomy   CESAREAN SECTION     1979, 1982   CHOLECYSTECTOMY     COLONOSCOPY     DILATATION & CURETTAGE/HYSTEROSCOPY WITH MYOSURE  11/2015   HERNIA REPAIR  2009   JOINT REPLACEMENT  2008   hip   MASTECTOMY Right 2021   OPEN SURGICAL REPAIR OF GLUTEAL  TENDON Left 07/18/2019   Procedure: Left hip bearing surface revision with gluteal tendon repair;  Surgeon: Theresa Lerner, MD;  Location: [facility];  Service: Orthopedics;  Laterality: Left;  2 hrs   PORTACATH PLACEMENT N/A 03/15/2020   Procedure: INSERTION PORT-A-CATH WITH ULTRASOUND GUIDANCE;  Surgeon: Theresa Ned, MD;  Location: [facility];  Service: General;  Laterality: N/A;   SIMPLE MASTECTOMY WITH AXILLARY SENTINEL NODE BIOPSY Right 03/15/2020   Procedure: RIGHT SIMPLE MASTECTOMY;  Surgeon: Theresa Ned, MD;  Location:  SURGERY CENTER;  Service: General;  Laterality: Right;   Patient Active Problem List   Diagnosis Date Noted   Port-A-Cath in place 05/15/2020   Genetic testing 02/03/2020   Family history of breast cancer    Family history of skin cancer    Family history of brain cancer    Family history of lung cancer    Family history of multiple myeloma    Malignant neoplasm of upper-outer quadrant of right breast in female, estrogen receptor positive (HCC) 01/04/2020   Melanoma of skin (HCC) 08/15/2019   Failed total hip arthroplasty, 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   Cough 05/31/2014   GERD (gastroesophageal reflux disease) 09/13/2012   Hypothyroidism 04/16/2009  Essential hypertension 04/16/2009   Osteoarthritis 04/16/2009    Onset date: years ago script dated 06/17/24  REFERRING DIAG: R05.3 (ICD-10-CM) - Chronic cough R49.0 (ICD-10-CM) - Dysphonia 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 It's so hard to concentrate on not pushing (my voice).   Pt accompanied by: self  PERTINENT HISTORY:  From [place]'s note  06/17/24: Theresa Bradley is a 73 year old female with chronic cough who presents with persistent cough and voice changes. She was referred by Dr. Fleeta Smock Allergies for evaluation of her chronic cough for several years and voice changes/dysphonia for several months   She has experienced a chronic cough for many years, which worsened after an illness in December 2024. She also experienced persistent hoarseness at the same time, and has had it since. The cough is associated with phlegm production, sometimes green in color, and occasional throat discomfort. A chest x-ray performed in May was normal. She has a history of asthma and has been prescribed an emergency inhaler, which she has not needed to use in the past month and a half. During her worst symptoms, she coughed up significant amounts of phlegm, which has since decreased but still occurs. She occasionally experiences throat pain, described as being lower in the throat, and has had episodes of difficulty swallowing, though these are not frequent.   Her voice has changed since December. She has a history of allergies and has been receiving allergy shots for years, though she stopped in May due to illness and antibiotic use. She has tried various antihistamines, including Zyrtec and Claritin, without significant relief. She performs daily nasal saline rinses and has a bottle of Flonase .    In the past, she consulted a pulmonologist who diagnosed her with a cyclical cough and provided treatment, but the cough returned. This was approximately seven or eight years ago.  PAIN:  Are you having pain? No  FALLS: Has patient fallen in last 6 months? No  PATIENT GOALS: Address voice concerns  OBJECTIVE:  Note: Objective measures were completed at Evaluation unless otherwise noted.  DIAGNOSTIC FINDINGS:  [place] 06/17/24: Procedure: Preoperative diagnosis: hoarseness   Postoperative diagnosis:   same VF atrophy and glottic  insufficiency   Procedure: Flexible fiberoptic laryngoscopy with stroboscopy (68420)    Surgeon: Theresa Larry, MD   Anesthesia: Topical lidocaine  and Afrin   Complications: None   Condition is stable throughout exam   Indications and consent:   The patient presents to the clinic with hoarseness. All the risks, benefits, and potential complications were reviewed with the patient preoperatively and informed verbal consent was obtained.   Procedure: The patient was seated upright in the exam chair.   Topical lidocaine  and Afrin were applied to the nasal cavity. After adequate anesthesia had occurred, the flexible telescope with strobe capabilities was passed into the nasal cavity. The nasopharynx was patent without mass or lesion. The scope was passed behind the soft palate and directed toward the base of tongue. The base of tongue was visualized and was symmetric with no apparent masses or abnormal appearing tissue. There were no signs of a mass or pooling of secretions in the piriform sinuses. The supraglottic structures were normal.   The true vocal cords are mobile. The medial edges were  straight. Closure was complete but with severe supraglottic compression. Periodicity present. The mucosal wave and amplitude were slightly decreased 2/2 straight and supraglottic compression. There is moder ate interarytenoid pachydermia and post cricoid edema. The mucosa appears without lesions.   The laryngoscope was then slowly withdrawn and the patient tolerated the procedure well. There were no complications or blood loss.  Assessment & Plan Chronic cough We discussed common causes of chronic cough and neurogenic cough. Previous treatments for post-nasal drainage and reflux were ineffective. Was seen by Pulm before and diagnosed with cyclical cough. Normal chest x-ray 03/2024. - Review superior laryngeal nerve block information. - Xyzal  5 mg daily and Flonase , and nasal rinses daily - Consider  superior laryngeal nerve block if symptoms persist.   Environmental allergies and Post-nasal drainage  Postnasal drip contributing to chronic cough. Previous treatments ineffective. - Continue nasal saline rinses. - Start Flonase  nasal spray BID - Prescribed Xyzal . - continue allergy shots    Dysphonia Hoarseness x 7 months Strobe today with supraglottic compression, VF atrophy and glottic insufficiency. We discussed a trial of speech therapy - Refer to speech therapy.   RTC 3 months   PATIENT REPORTED OUTCOME MEASURES (PROM): [place] Cough Index: provided 07/13/24 AND ASKED TO RETURN NEXT SESSION                                                                                                                            TREATMENT DATE:  AB= Abdominal breathing; SOVTE=Semi-occluded vocal tract exercises, VFE=Vocal function exercises  07/20/24: Pt to bring cough index next session. WNL voice quality heard approx 60% today. Pt has cut all caff coffee except one cup with a little decaf in AM. Finds it hard to stop coughing as cough sneaks up on her -tried to use sniff/blow technique. Flonase  has dried up some of the mucous. SLP reviewed procedure for all chronic cough rescue breathing strategies and pt completing each incorrectly. SLP corrected pt's productions and pt was independent.  Pt admitted she has to think about which strategy to use when she starts coughing instead of spontaneously starting one. SLP told pt to cont to practice strategies 20x each/daily to make this easier when she has an episode.   07/13/24: SLP worked with pt today with SOVTE and flow phonation exercises to reduce tension/compression when vocalizing. SLP used /m/, /n/, /u/, and /o/ words and pt initially with 30% improving to 85% success with WNL voicing with pitch variations. Still rough sounding in lower pitches - possibly due to vocal fold atrophy? Pt will practice phrases  at home as in pt instructions.   06/30/24:  SLP educated pt on throat clear alternatives; pt began using water sips after two additional throat clears. SLP guided pt through strategies for chronic cough. SLP then started AB practice, which triggered chronic cough. SLP then guided pt through strategies for chronic cough in the moment, which quelled cough in 90 seconds. Homework to work on AB and to  practice chronic cough strategies when not in coughing spell.   06/27/24: Not charged due to not authorized by insurance. SLP educated pt about AB and watched pt as she attempted AB. At rest pt began at 50% success but with occasional min A she incr'd success to 80%. She was told to practice this at home 10-15 minutes BID.  PATIENT EDUCATION: Education details: see treatment date this date, above Person educated: Patient Education method: Programmer, multimedia, Demonstration, and Verbal cues Education comprehension: verbalized understanding, returned demonstration, verbal cues required, and needs further education  HOME EXERCISE PROGRAM: AB but will eventually include SOVTE  GOALS: Goals reviewed with patient? Yes  SHORT TERM GOALS: Target date: 07/29/24  Pt will complete AB at rest with 85% success for 5 minutes, in 2 sessions Baseline: Goal status: INITIAL  2.  Pt will complete AB in at least 17/20 sentence responses, in two sessions Baseline:  Goal status: INITIAL  3.  To decr laryngeal compression, pt will complete SOVTE tasks in session with 90% success in 2 sessions Baseline:  Goal status: INITIAL   LONG TERM GOALS: Target date: 08/26/24  Pt will improve PROM from initial administration Baseline:  Goal status: INITIAL  2.  Pt will reduce throat clears to <3 in 3 sessions, using compensations PRN Baseline:  Goal status: INITIAL  3.  Pt will exhibit WNL voice quality for 1 minute following SOVTE or other voice exercises in 2 sessions Baseline:  Goal status: INITIAL  4.  Pt will exhibit WNL voice quality for 5 minutes following  SOVTE or other voice exercises in 3 sessions Baseline:  Goal status: INITIAL  5.  Pt will report being able to talk on the phone for 30 minutes without coughing episodes, between 2 sessions, using compensations PRN  Baseline:  Goal status: INITIAL   ASSESSMENT:  CLINICAL IMPRESSION: Patient is a 73 y.o. F who was seen today for treatment of voice. She demonstrated better/WNL quality voice approx 60% today. Today pt had 2 throat clears and 0 coughs in the session. She will cont to need skilled ST both for chronic cough and for improving voice quality due to vocal fold atrophy and laryngeal compression.  OBJECTIVE IMPAIRMENTS: include voice disorder. These impairments are limiting patient from managing appointments, household responsibilities, ADLs/IADLs, and effectively communicating at home and in community. Factors affecting potential to achieve goals and functional outcome are severity of impairments.. Patient will benefit from skilled SLP services to address above impairments and improve overall function.  REHAB POTENTIAL: Good  PLAN:  SLP FREQUENCY: 2x/week  SLP DURATION: 8 weeks  PLANNED INTERVENTIONS: Internal/external aids, Functional tasks, SLP instruction and feedback, Compensatory strategies, Patient/family education, [phone] Treatment of speech (30 or 45 min) , and voice exercises    Arlee Santosuosso, CCC-SLP 07/20/2024, 11:08 AM

## 2024-07-20 NOTE — Patient Instructions (Signed)
 [facility] Handout on chronic cough

## 2024-07-21 ENCOUNTER — Ambulatory Visit

## 2024-07-26 ENCOUNTER — Ambulatory Visit: Attending: Otolaryngology

## 2024-07-26 DIAGNOSIS — R498 Other voice and resonance disorders: Secondary | ICD-10-CM | POA: Insufficient documentation

## 2024-07-26 NOTE — Therapy (Signed)
 OUTPATIENT SPEECH LANGUAGE PATHOLOGY VOICE TREATMENT   Patient Name: Theresa Bradley MRN: [id number] DOB:Feb 26, 1951, 73 y.o., female Today's Date: 07/26/2024  PCP: Theresa Pin, MD REFERRING PROVIDER: Okey Burns, MD  END OF SESSION:  End of Session - 07/26/24 1737     Visit Number 5    Number of Visits 17    Date for SLP Re-Evaluation 08/26/24    SLP Start Time 1534    SLP Stop Time  1615    SLP Time Calculation (min) 41 min    Activity Tolerance Patient tolerated treatment well            Past Medical History:  Diagnosis Date   Anxiety    Breast cancer (HCC) 2009   right   Breast cancer (HCC) 2021   Cancer (HCC)    COUGH, CHRONIC 05/10/2010   Dysrhythmia    occationally skips a beat   Family history of brain cancer    Family history of breast cancer    Family history of lung cancer    Family history of multiple myeloma    Family history of skin cancer    GERD (gastroesophageal reflux disease)    HYPERTENSION 04/16/2009   HYPOTHYROIDISM 04/16/2009   OSTEOARTHRITIS 04/16/2009   Personal history of chemotherapy 2021   Personal history of radiation therapy 2009   SENILE LENTIGO 06/10/2010   Past Surgical History:  Procedure Laterality Date   BREAST EXCISIONAL BIOPSY Right    BREAST EXCISIONAL BIOPSY Left    BREAST LUMPECTOMY Right 2009   radiation only   BREAST LUMPECTOMY WITH RADIOACTIVE SEED AND SENTINEL LYMPH NODE BIOPSY Right 01/31/2020   Procedure: RIGHT BREAST LUMPECTOMY WITH RADIOACTIVE SEED AND SENTINEL LYMPH NODE BIOPSY;  Surgeon: Theresa Ned, MD;  Location: [facility];  Service: General;  Laterality: Right;  PEC BLOCK   BREAST SURGERY  2009   X 2, cancer stage 0, lumpectomy   CESAREAN SECTION     1979, 1982   CHOLECYSTECTOMY     COLONOSCOPY     DILATATION & CURETTAGE/HYSTEROSCOPY WITH MYOSURE  11/2015   HERNIA REPAIR  2009   JOINT REPLACEMENT  2008   hip   MASTECTOMY Right 2021   OPEN SURGICAL REPAIR OF GLUTEAL  TENDON Left 07/18/2019   Procedure: Left hip bearing surface revision with gluteal tendon repair;  Surgeon: Theresa Lerner, MD;  Location: [facility];  Service: Orthopedics;  Laterality: Left;  2 hrs   PORTACATH PLACEMENT N/A 03/15/2020   Procedure: INSERTION PORT-A-CATH WITH ULTRASOUND GUIDANCE;  Surgeon: Theresa Ned, MD;  Location: [facility];  Service: General;  Laterality: N/A;   SIMPLE MASTECTOMY WITH AXILLARY SENTINEL NODE BIOPSY Right 03/15/2020   Procedure: RIGHT SIMPLE MASTECTOMY;  Surgeon: Theresa Ned, MD;  Location: [facility];  Service: General;  Laterality: Right;   Patient Active Problem List   Diagnosis Date Noted   Port-A-Cath in place 05/15/2020   Genetic testing 02/03/2020   Family history of breast cancer    Family history of skin cancer    Family history of brain cancer    Family history of lung cancer    Family history of multiple myeloma    Malignant neoplasm of upper-outer quadrant of right breast in female, estrogen receptor positive (HCC) 01/04/2020   Melanoma of skin (HCC) 08/15/2019   Failed total hip arthroplasty, 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   Cough 05/31/2014   GERD (gastroesophageal reflux disease) 09/13/2012   Hypothyroidism 04/16/2009  Essential hypertension 04/16/2009   Osteoarthritis 04/16/2009    Onset date: years ago script dated 06/17/24  REFERRING DIAG: R05.3 (ICD-10-CM) - Chronic cough R49.0 (ICD-10-CM) - Dysphonia 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   Pt accompanied by: self  PERTINENT HISTORY:  From Theresa Bradley's note 06/17/24: Theresa Bradley is a 73 year old  female with chronic cough who presents with persistent cough and voice changes. She was referred by Dr. Fleeta Bradley Allergies for evaluation of her chronic cough for several years and voice changes/dysphonia for several months   She has experienced a chronic cough for many years, which worsened after an illness in December 2024. She also experienced persistent hoarseness at the same time, and has had it since. The cough is associated with phlegm production, sometimes green in color, and occasional throat discomfort. A chest x-ray performed in May was normal. She has a history of asthma and has been prescribed an emergency inhaler, which she has not needed to use in the past month and a half. During her worst symptoms, she coughed up significant amounts of phlegm, which has since decreased but still occurs. She occasionally experiences throat pain, described as being lower in the throat, and has had episodes of difficulty swallowing, though these are not frequent.   Her voice has changed since December. She has a history of allergies and has been receiving allergy shots for years, though she stopped in May due to illness and antibiotic use. She has tried various antihistamines, including Zyrtec and Claritin, without significant relief. She performs daily nasal saline rinses and has a bottle of Flonase .    In the past, she consulted a pulmonologist who diagnosed her with a cyclical cough and provided treatment, but the cough returned. This was approximately seven or eight years ago.  PAIN:  Are you having pain? No  FALLS: Has patient fallen in last 6 months? No  PATIENT GOALS: Address voice concerns  OBJECTIVE:  Note: Objective measures were completed at Evaluation unless otherwise noted.  DIAGNOSTIC FINDINGS:  Theresa Bradley 06/17/24: Procedure: Preoperative diagnosis: hoarseness   Postoperative diagnosis:   same VF atrophy and glottic insufficiency   Procedure: Flexible fiberoptic laryngoscopy with  stroboscopy (68420)    Surgeon: Theresa Larry, MD   Anesthesia: Topical lidocaine  and Afrin   Complications: None   Condition is stable throughout exam   Indications and consent:   The patient presents to the clinic with hoarseness. All the risks, benefits, and potential complications were reviewed with the patient preoperatively and informed verbal consent was obtained.   Procedure: The patient was seated upright in the exam chair.   Topical lidocaine  and Afrin were applied to the nasal cavity. After adequate anesthesia had occurred, the flexible telescope with strobe capabilities was passed into the nasal cavity. The nasopharynx was patent without mass or lesion. The scope was passed behind the soft palate and directed toward the base of tongue. The base of tongue was visualized and was symmetric with no apparent masses or abnormal appearing tissue. There were no signs of a mass or pooling of secretions in the piriform sinuses. The supraglottic structures were normal.   The true vocal cords are mobile. The medial edges were straight. Closure was complete but with severe supraglottic compression.  Periodicity present. The mucosal wave and amplitude were slightly decreased 2/2 straight and supraglottic compression. There is moder ate interarytenoid pachydermia and post cricoid edema. The mucosa appears without lesions.   The laryngoscope was then slowly withdrawn and the patient tolerated the procedure well. There were no complications or blood loss.  Assessment & Plan Chronic cough We discussed common causes of chronic cough and neurogenic cough. Previous treatments for post-nasal drainage and reflux were ineffective. Was seen by Pulm before and diagnosed with cyclical cough. Normal chest x-ray 03/2024. - Review superior laryngeal nerve block information. - Xyzal  5 mg daily and Flonase , and nasal rinses daily - Consider superior laryngeal nerve block if symptoms persist.   Environmental  allergies and Post-nasal drainage  Postnasal drip contributing to chronic cough. Previous treatments ineffective. - Continue nasal saline rinses. - Start Flonase  nasal spray BID - Prescribed Xyzal . - continue allergy shots    Dysphonia Hoarseness x 7 months Strobe today with supraglottic compression, VF atrophy and glottic insufficiency. We discussed a trial of speech therapy - Refer to speech therapy.   RTC 3 months   PATIENT REPORTED OUTCOME MEASURES (PROM): [place] Cough Index: Scored herself 63/133, with lower scores indicating worse QoL and negative affects from pt's cough.                                                                                                                            TREATMENT DATE:  AB= Abdominal breathing; SOVTE=Semi-occluded vocal tract exercises, VFE=Vocal function exercises  07/26/24: SLP provided pt another copy of cough index as she misplaced the first. Score as above. AB at rest for 5 minutes - 90% success. Meds for congestion helping with congested cough - no longer exists. Voice does not sound as strong - pt states her husband is asking her to repeat more in last 1-2 weeks.  SLP used flow phonation techniques and SOVTE to normalize pt's vocal quality. Blowing with tissue used as visual support - pt with 100% success, with vocalization and exhalation initial 0% success but pt became more successful as SLP continued cueing and demonstrating. SLP added /m/, /s/, and sh in front of oo for feeling of flow phonation. Pt's first sentence after practicing flow phonation was WNL voicing, but then reverted back to that indicative of compression after one sentence. Pt to practice flow phonation tasks covered in ST today for 15 minutes multiple times/day. AFTER 25 MINUTES OF FLOW PHONATION PRACTICE PT EXPERIENCED COUGH SPELL for approx 4 minutes. She used box breathing and pursed lip inhale/exhale for success in stopping sx.   07/20/24: Pt to bring cough  index next session. WNL voice quality heard approx 60% today. Pt has cut all caff coffee except one cup with a little decaf in AM. Finds it hard to stop coughing as cough sneaks up on her -tried to use sniff/blow technique. Flonase  has dried up some of the mucous. SLP reviewed procedure for all chronic cough rescue breathing strategies  and pt completing each incorrectly. SLP corrected pt's productions and pt was independent.  Pt admitted she has to think about which strategy to use when she starts coughing instead of spontaneously starting one. SLP told pt to cont to practice strategies 20x each/daily to make this easier when she has an episode.   07/13/24: SLP worked with pt today with SOVTE and flow phonation exercises to reduce tension/compression when vocalizing. SLP used /m/, /n/, /u/, and /o/ words and pt initially with 30% improving to 85% success with WNL voicing with pitch variations. Still rough sounding in lower pitches - possibly due to vocal fold atrophy? Pt will practice phrases  at home as in pt instructions.   06/30/24: SLP educated pt on throat clear alternatives; pt began using water sips after two additional throat clears. SLP guided pt through strategies for chronic cough. SLP then started AB practice, which triggered chronic cough. SLP then guided pt through strategies for chronic cough in the moment, which quelled cough in 90 seconds. Homework to work on AB and to practice chronic cough strategies when not in coughing spell.   06/27/24: Not charged due to not authorized by insurance. SLP educated pt about AB and watched pt as she attempted AB. At rest pt began at 50% success but with occasional min A she incr'd success to 80%. She was told to practice this at home 10-15 minutes BID.  PATIENT EDUCATION: Education details: see treatment date this date, above Person educated: Patient Education method: Programmer, multimedia, Demonstration, and Verbal cues Education comprehension: verbalized  understanding, returned demonstration, verbal cues required, and needs further education  HOME EXERCISE PROGRAM: AB but will eventually include SOVTE  GOALS: Goals reviewed with patient? Yes  SHORT TERM GOALS: Target date: 07/29/24  Pt will complete AB at rest with 85% success for 5 minutes, in 2 sessions Baseline: 07/26/24 Goal status: INITIAL  2.  Pt will complete AB in at least 17/20 sentence responses, in two sessions Baseline:  Goal status: INITIAL  3.  To decr laryngeal compression, pt will complete SOVTE tasks in session with 90% success in 2 sessions Baseline:  Goal status: INITIAL   LONG TERM GOALS: Target date: 08/26/24  Pt will improve PROM from initial administration Baseline:  Goal status: INITIAL  2.  Pt will reduce throat clears to <3 in 3 sessions, using compensations PRN Baseline:  Goal status: INITIAL  3.  Pt will exhibit WNL voice quality for 1 minute following SOVTE or other voice exercises in 2 sessions Baseline:  Goal status: INITIAL  4.  Pt will exhibit WNL voice quality for 5 minutes following SOVTE or other voice exercises in 3 sessions Baseline:  Goal status: INITIAL  5.  Pt will report being able to talk on the phone for 30 minutes without coughing episodes, between 2 sessions, using compensations PRN  Baseline:  Goal status: INITIAL   ASSESSMENT:  CLINICAL IMPRESSION: Patient is a 73 y.o. F who was seen today for treatment of voice. See treatment date above for today's date for further details on today's session. She will cont to need skilled ST both for chronic cough and for improving voice quality due to vocal fold atrophy and laryngeal compression.  OBJECTIVE IMPAIRMENTS: include voice disorder. These impairments are limiting patient from managing appointments, household responsibilities, ADLs/IADLs, and effectively communicating at home and in community. Factors affecting potential to achieve goals and functional outcome are severity of  impairments.. Patient will benefit from skilled SLP services to address above impairments and  improve overall function.  REHAB POTENTIAL: Good  PLAN:  SLP FREQUENCY: 2x/week  SLP DURATION: 8 weeks  PLANNED INTERVENTIONS: Internal/external aids, Functional tasks, SLP instruction and feedback, Compensatory strategies, Patient/family education, [phone] Treatment of speech (30 or 45 min) , and voice exercises    Jim Lundin, CCC-SLP 07/26/2024, 5:37 PM

## 2024-07-28 ENCOUNTER — Ambulatory Visit

## 2024-07-28 DIAGNOSIS — R498 Other voice and resonance disorders: Secondary | ICD-10-CM | POA: Diagnosis not present

## 2024-07-28 NOTE — Therapy (Signed)
 OUTPATIENT SPEECH LANGUAGE PATHOLOGY VOICE TREATMENT   Patient Name: Theresa Bradley MRN: [id number] DOB:31-Aug-1951, 73 y.o., female Today's Date: 07/28/2024  PCP: Micheal Pin, MD REFERRING PROVIDER: Okey Burns, MD  END OF SESSION:  End of Session - 07/28/24 1625     Visit Number 6    Number of Visits 17    Date for SLP Re-Evaluation 08/26/24    SLP Start Time 1535    SLP Stop Time  1615    SLP Time Calculation (min) 40 min    Activity Tolerance Patient tolerated treatment well             Past Medical History:  Diagnosis Date   Anxiety    Breast cancer (HCC) 2009   right   Breast cancer (HCC) 2021   Cancer (HCC)    COUGH, CHRONIC 05/10/2010   Dysrhythmia    occationally skips a beat   Family history of brain cancer    Family history of breast cancer    Family history of lung cancer    Family history of multiple myeloma    Family history of skin cancer    GERD (gastroesophageal reflux disease)    HYPERTENSION 04/16/2009   HYPOTHYROIDISM 04/16/2009   OSTEOARTHRITIS 04/16/2009   Personal history of chemotherapy 2021   Personal history of radiation therapy 2009   SENILE LENTIGO 06/10/2010   Past Surgical History:  Procedure Laterality Date   BREAST EXCISIONAL BIOPSY Right    BREAST EXCISIONAL BIOPSY Left    BREAST LUMPECTOMY Right 2009   radiation only   BREAST LUMPECTOMY WITH RADIOACTIVE SEED AND SENTINEL LYMPH NODE BIOPSY Right 01/31/2020   Procedure: RIGHT BREAST LUMPECTOMY WITH RADIOACTIVE SEED AND SENTINEL LYMPH NODE BIOPSY;  Surgeon: Vanderbilt Ned, MD;  Location: [facility];  Service: General;  Laterality: Right;  PEC BLOCK   BREAST SURGERY  2009   X 2, cancer stage 0, lumpectomy   CESAREAN SECTION     1979, 1982   CHOLECYSTECTOMY     COLONOSCOPY     DILATATION & CURETTAGE/HYSTEROSCOPY WITH MYOSURE  11/2015   HERNIA REPAIR  2009   JOINT REPLACEMENT  2008   hip   MASTECTOMY Right 2021   OPEN SURGICAL REPAIR OF GLUTEAL  TENDON Left 07/18/2019   Procedure: Left hip bearing surface revision with gluteal tendon repair;  Surgeon: Melodi Lerner, MD;  Location: [facility];  Service: Orthopedics;  Laterality: Left;  2 hrs   PORTACATH PLACEMENT N/A 03/15/2020   Procedure: INSERTION PORT-A-CATH WITH ULTRASOUND GUIDANCE;  Surgeon: Vanderbilt Ned, MD;  Location: [facility];  Service: General;  Laterality: N/A;   SIMPLE MASTECTOMY WITH AXILLARY SENTINEL NODE BIOPSY Right 03/15/2020   Procedure: RIGHT SIMPLE MASTECTOMY;  Surgeon: Vanderbilt Ned, MD;  Location: [facility];  Service: General;  Laterality: Right;   Patient Active Problem List   Diagnosis Date Noted   Port-A-Cath in place 05/15/2020   Genetic testing 02/03/2020   Family history of breast cancer    Family history of skin cancer    Family history of brain cancer    Family history of lung cancer    Family history of multiple myeloma    Malignant neoplasm of upper-outer quadrant of right breast in female, estrogen receptor positive (HCC) 01/04/2020   Melanoma of skin (HCC) 08/15/2019   Failed total hip arthroplasty, 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   Cough 05/31/2014   GERD (gastroesophageal reflux disease) 09/13/2012   Hypothyroidism  04/16/2009   Essential hypertension 04/16/2009   Osteoarthritis 04/16/2009    Onset date: years ago script dated 06/17/24  REFERRING DIAG: R05.3 (ICD-10-CM) - Chronic cough R49.0 (ICD-10-CM) - Dysphonia 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   Pt accompanied by: self  PERTINENT HISTORY:  From [place]'s note 06/17/24: Theresa Bradley is a 73 year old  female with chronic cough who presents with persistent cough and voice changes. She was referred by Dr. Fleeta Smock Allergies for evaluation of her chronic cough for several years and voice changes/dysphonia for several months   She has experienced a chronic cough for many years, which worsened after an illness in December 2024. She also experienced persistent hoarseness at the same time, and has had it since. The cough is associated with phlegm production, sometimes green in color, and occasional throat discomfort. A chest x-ray performed in May was normal. She has a history of asthma and has been prescribed an emergency inhaler, which she has not needed to use in the past month and a half. During her worst symptoms, she coughed up significant amounts of phlegm, which has since decreased but still occurs. She occasionally experiences throat pain, described as being lower in the throat, and has had episodes of difficulty swallowing, though these are not frequent.   Her voice has changed since December. She has a history of allergies and has been receiving allergy shots for years, though she stopped in May due to illness and antibiotic use. She has tried various antihistamines, including Zyrtec and Claritin, without significant relief. She performs daily nasal saline rinses and has a bottle of Flonase .    In the past, she consulted a pulmonologist who diagnosed her with a cyclical cough and provided treatment, but the cough returned. This was approximately seven or eight years ago.  PAIN:  Are you having pain? No  FALLS: Has patient fallen in last 6 months? No  PATIENT GOALS: Address voice concerns  OBJECTIVE:  Note: Objective measures were completed at Evaluation unless otherwise noted.  DIAGNOSTIC FINDINGS:  [place] 06/17/24: Procedure: Preoperative diagnosis: hoarseness   Postoperative diagnosis:   same VF atrophy and glottic insufficiency   Procedure: Flexible fiberoptic laryngoscopy with  stroboscopy (68420)    Surgeon: Elena Larry, MD   Anesthesia: Topical lidocaine  and Afrin   Complications: None   Condition is stable throughout exam   Indications and consent:   The patient presents to the clinic with hoarseness. All the risks, benefits, and potential complications were reviewed with the patient preoperatively and informed verbal consent was obtained.   Procedure: The patient was seated upright in the exam chair.   Topical lidocaine  and Afrin were applied to the nasal cavity. After adequate anesthesia had occurred, the flexible telescope with strobe capabilities was passed into the nasal cavity. The nasopharynx was patent without mass or lesion. The scope was passed behind the soft palate and directed toward the base of tongue. The base of tongue was visualized and was symmetric with no apparent masses or abnormal appearing tissue. There were no signs of a mass or pooling of secretions in the piriform sinuses. The supraglottic structures were normal.   The true vocal cords are mobile. The medial edges were straight. Closure was complete but with  severe supraglottic compression. Periodicity present. The mucosal wave and amplitude were slightly decreased 2/2 straight and supraglottic compression. There is moder ate interarytenoid pachydermia and post cricoid edema. The mucosa appears without lesions.   The laryngoscope was then slowly withdrawn and the patient tolerated the procedure well. There were no complications or blood loss.  Assessment & Plan Chronic cough We discussed common causes of chronic cough and neurogenic cough. Previous treatments for post-nasal drainage and reflux were ineffective. Was seen by Pulm before and diagnosed with cyclical cough. Normal chest x-ray 03/2024. - Review superior laryngeal nerve block information. - Xyzal  5 mg daily and Flonase , and nasal rinses daily - Consider superior laryngeal nerve block if symptoms persist.   Environmental  allergies and Post-nasal drainage  Postnasal drip contributing to chronic cough. Previous treatments ineffective. - Continue nasal saline rinses. - Start Flonase  nasal spray BID - Prescribed Xyzal . - continue allergy shots    Dysphonia Hoarseness x 7 months Strobe today with supraglottic compression, VF atrophy and glottic insufficiency. We discussed a trial of speech therapy - Refer to speech therapy.   RTC 3 months   PATIENT REPORTED OUTCOME MEASURES (PROM): [place] Cough Index: Scored herself 63/133, with lower scores indicating worse QoL and negative affects from pt's cough.                                                                                                                            TREATMENT DATE:  AB= Abdominal breathing; SOVTE=Semi-occluded vocal tract exercises, VFE=Vocal function exercises  07/28/24: After flow phonation practice last night pt stated she heard her WNL voice for 1 or 2 sentences afterwards. SLP ascertained pt also has sx of hypersensitivity to scent - pt has coughing with strong scents. Mild cough during Pt coughed last night during a meeting where she had to leave the meeting room. SLP had pt practice all chronic cough breathing strategies and stressed AB and sip/swallow to use as well. SLP guided pt through flow phonation practice and pt began coughing within 2 minutes of practice. Pt reported she coughed within 5 minutes of practice with flow phonation since last session but has cont'd to practice with this despite that. Today WNL voice was heard after 4 reps of moo using tissue, preceeded by 8 reps of ooooo with tissue. Then pt began coughing.   07/26/24: SLP provided pt another copy of cough index as she misplaced the first. Score as above. AB at rest for 5 minutes - 90% success. Meds for congestion helping with congested cough - no longer exists. Voice does not sound as strong - pt states her husband is asking her to repeat more in last 1-2 weeks.   SLP used flow phonation techniques and SOVTE to normalize pt's vocal quality. Blowing with tissue used as visual support - pt with 100% success, with vocalization and exhalation initial 0% success but pt became more successful as SLP continued cueing and demonstrating. SLP added /m/, /s/, and  sh in front of oo for feeling of flow phonation. Pt's first sentence after practicing flow phonation was WNL voicing, but then reverted back to that indicative of compression after one sentence. Pt to practice flow phonation tasks covered in ST today for 15 minutes multiple times/day. AFTER 25 MINUTES OF FLOW PHONATION PRACTICE PT EXPERIENCED COUGH SPELL for approx 4 minutes. She used box breathing and pursed lip inhale/exhale for success in stopping sx.  07/20/24: Pt to bring cough index next session. WNL voice quality heard approx 60% today. Pt has cut all caff coffee except one cup with a little decaf in AM. Finds it hard to stop coughing as cough sneaks up on her -tried to use sniff/blow technique. Flonase  has dried up some of the mucous. SLP reviewed procedure for all chronic cough rescue breathing strategies and pt completing each incorrectly. SLP corrected pt's productions and pt was independent.  Pt admitted she has to think about which strategy to use when she starts coughing instead of spontaneously starting one. SLP told pt to cont to practice strategies 20x each/daily to make this easier when she has an episode.   07/13/24: SLP worked with pt today with SOVTE and flow phonation exercises to reduce tension/compression when vocalizing. SLP used /m/, /n/, /u/, and /o/ words and pt initially with 30% improving to 85% success with WNL voicing with pitch variations. Still rough sounding in lower pitches - possibly due to vocal fold atrophy? Pt will practice phrases  at home as in pt instructions.   06/30/24: SLP educated pt on throat clear alternatives; pt began using water sips after two additional throat  clears. SLP guided pt through strategies for chronic cough. SLP then started AB practice, which triggered chronic cough. SLP then guided pt through strategies for chronic cough in the moment, which quelled cough in 90 seconds. Homework to work on AB and to practice chronic cough strategies when not in coughing spell.   06/27/24: Not charged due to not authorized by insurance. SLP educated pt about AB and watched pt as she attempted AB. At rest pt began at 50% success but with occasional min A she incr'd success to 80%. She was told to practice this at home 10-15 minutes BID.  PATIENT EDUCATION: Education details: see treatment date this date, above Person educated: Patient Education method: Programmer, multimedia, Demonstration, and Verbal cues Education comprehension: verbalized understanding, returned demonstration, verbal cues required, and needs further education  HOME EXERCISE PROGRAM: AB but will eventually include SOVTE  GOALS: Goals reviewed with patient? Yes  SHORT TERM GOALS: Target date: 07/29/24  Pt will complete AB at rest with 85% success for 5 minutes, in 2 sessions Baseline: 07/26/24 Goal status: INITIAL  2.  Pt will complete AB in at least 17/20 sentence responses, in two sessions Baseline:  Goal status: INITIAL  3.  To decr laryngeal compression, pt will complete SOVTE tasks in session with 90% success in 2 sessions Baseline: 07/28/24 Goal status: INITIAL   LONG TERM GOALS: Target date: 08/26/24  Pt will improve PROM from initial administration Baseline:  Goal status: INITIAL  2.  Pt will reduce throat clears to <3 in 3 sessions, using compensations PRN Baseline:  Goal status: INITIAL  3.  Pt will exhibit WNL voice quality for 1 minute following SOVTE or other voice exercises in 2 sessions Baseline:  Goal status: INITIAL  4.  Pt will exhibit WNL voice quality for 5 minutes following SOVTE or other voice exercises in 3 sessions Baseline:  Goal  status: INITIAL  5.  Pt  will report being able to talk on the phone for 30 minutes without coughing episodes, between 2 sessions, using compensations PRN  Baseline:  Goal status: INITIAL   ASSESSMENT:  CLINICAL IMPRESSION: Patient is a 73 y.o. F who was seen today for treatment of voice. See treatment date above for today's date for further details on today's session. Pt has s/sx of hypersensitivity to scent - will bring cough. She will cont to need skilled ST both for chronic cough and for improving voice quality due to vocal fold atrophy and laryngeal compression.  OBJECTIVE IMPAIRMENTS: include voice disorder. These impairments are limiting patient from managing appointments, household responsibilities, ADLs/IADLs, and effectively communicating at home and in community. Factors affecting potential to achieve goals and functional outcome are severity of impairments.. Patient will benefit from skilled SLP services to address above impairments and improve overall function.  REHAB POTENTIAL: Good  PLAN:  SLP FREQUENCY: 2x/week  SLP DURATION: 8 weeks  PLANNED INTERVENTIONS: Internal/external aids, Functional tasks, SLP instruction and feedback, Compensatory strategies, Patient/family education, [phone] Treatment of speech (30 or 45 min) , and voice exercises    Linn Clavin, CCC-SLP 07/28/2024, 4:26 PM

## 2024-07-31 ENCOUNTER — Other Ambulatory Visit: Payer: Self-pay | Admitting: Family Medicine

## 2024-08-01 ENCOUNTER — Ambulatory Visit

## 2024-08-04 ENCOUNTER — Ambulatory Visit

## 2024-08-04 DIAGNOSIS — R498 Other voice and resonance disorders: Secondary | ICD-10-CM | POA: Diagnosis not present

## 2024-08-04 NOTE — Therapy (Signed)
 OUTPATIENT SPEECH LANGUAGE PATHOLOGY VOICE TREATMENT   Patient Name: Theresa Bradley MRN: [id number] DOB:[birth year], 73 y.o., female Today's Date: 08/04/2024  PCP: Micheal Pin, MD REFERRING PROVIDER: Okey Burns, MD  END OF SESSION:  End of Session - 08/04/24 1535     Visit Number 7    Number of Visits 17    Date for SLP Re-Evaluation 08/26/24    SLP Start Time 1535    SLP Stop Time  1615    SLP Time Calculation (min) 40 min    Activity Tolerance Patient tolerated treatment well             Past Medical History:  Diagnosis Date   Anxiety    Breast cancer (HCC) 2009   right   Breast cancer (HCC) 2021   Cancer (HCC)    COUGH, CHRONIC 05/10/2010   Dysrhythmia    occationally skips a beat   Family history of brain cancer    Family history of breast cancer    Family history of lung cancer    Family history of multiple myeloma    Family history of skin cancer    GERD (gastroesophageal reflux disease)    HYPERTENSION 04/16/2009   HYPOTHYROIDISM 04/16/2009   OSTEOARTHRITIS 04/16/2009   Personal history of chemotherapy 2021   Personal history of radiation therapy 2009   SENILE LENTIGO 06/10/2010   Past Surgical History:  Procedure Laterality Date   BREAST EXCISIONAL BIOPSY Right    BREAST EXCISIONAL BIOPSY Left    BREAST LUMPECTOMY Right 2009   radiation only   BREAST LUMPECTOMY WITH RADIOACTIVE SEED AND SENTINEL LYMPH NODE BIOPSY Right 01/31/2020   Procedure: RIGHT BREAST LUMPECTOMY WITH RADIOACTIVE SEED AND SENTINEL LYMPH NODE BIOPSY;  Surgeon: Vanderbilt Ned, MD;  Location: [facility];  Service: General;  Laterality: Right;  PEC BLOCK   BREAST SURGERY  2009   X 2, cancer stage 0, lumpectomy   CESAREAN SECTION     1979, 1982   CHOLECYSTECTOMY     COLONOSCOPY     DILATATION & CURETTAGE/HYSTEROSCOPY WITH MYOSURE  11/2015   HERNIA REPAIR  2009   JOINT REPLACEMENT  2008   hip   MASTECTOMY Right 2021   OPEN SURGICAL REPAIR OF  GLUTEAL TENDON Left 07/18/2019   Procedure: Left hip bearing surface revision with gluteal tendon repair;  Surgeon: Melodi Lerner, MD;  Location: [facility];  Service: Orthopedics;  Laterality: Left;  2 hrs   PORTACATH PLACEMENT N/A 03/15/2020   Procedure: INSERTION PORT-A-CATH WITH ULTRASOUND GUIDANCE;  Surgeon: Vanderbilt Ned, MD;  Location: [facility];  Service: General;  Laterality: N/A;   SIMPLE MASTECTOMY WITH AXILLARY SENTINEL NODE BIOPSY Right 03/15/2020   Procedure: RIGHT SIMPLE MASTECTOMY;  Surgeon: Vanderbilt Ned, MD;  Location: [facility];  Service: General;  Laterality: Right;   Patient Active Problem List   Diagnosis Date Noted   Port-A-Cath in place 05/15/2020   Genetic testing 02/03/2020   Family history of breast cancer    Family history of skin cancer    Family history of brain cancer    Family history of lung cancer    Family history of multiple myeloma    Malignant neoplasm of upper-outer quadrant of right breast in female, estrogen receptor positive (HCC) 01/04/2020   Melanoma of skin (HCC) 08/15/2019   Failed total hip arthroplasty, 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   Cough 05/31/2014   GERD (gastroesophageal reflux disease) 09/13/2012   Hypothyroidism  04/16/2009   Essential hypertension 04/16/2009   Osteoarthritis 04/16/2009    Onset date: years ago script dated 06/17/24  REFERRING DIAG: R05.3 (ICD-10-CM) - Chronic cough R49.0 (ICD-10-CM) - Dysphonia 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 I didn't have any water.   Pt accompanied by: self  PERTINENT HISTORY:  From [place]'s note 06/17/24: Theresa Bradley is a 73 year old female with chronic cough who presents with persistent cough and voice changes. She was referred by Dr. Fleeta Smock Allergies for evaluation of her chronic cough for several years and voice changes/dysphonia for several months   She has experienced a chronic cough for many years, which worsened after an illness in December 2024. She also experienced persistent hoarseness at the same time, and has had it since. The cough is associated with phlegm production, sometimes green in color, and occasional throat discomfort. A chest x-ray performed in May was normal. She has a history of asthma and has been prescribed an emergency inhaler, which she has not needed to use in the past month and a half. During her worst symptoms, she coughed up significant amounts of phlegm, which has since decreased but still occurs. She occasionally experiences throat pain, described as being lower in the throat, and has had episodes of difficulty swallowing, though these are not frequent.   Her voice has changed since December. She has a history of allergies and has been receiving allergy shots for years, though she stopped in May due to illness and antibiotic use. She has tried various antihistamines, including Zyrtec and Claritin, without significant relief. She performs daily nasal saline rinses and has a bottle of Flonase .    In the past, she consulted a pulmonologist who diagnosed her with a cyclical cough and provided treatment, but the cough returned. This was approximately seven or eight years ago.  PAIN:  Are you having pain? No  FALLS: Has patient fallen in last 6 months? No  PATIENT GOALS: Address voice concerns  OBJECTIVE:  Note: Objective measures were completed at Evaluation unless otherwise noted.  DIAGNOSTIC FINDINGS:  [place] 06/17/24: Procedure: Preoperative diagnosis: hoarseness   Postoperative diagnosis:   same VF atrophy and glottic insufficiency   Procedure: Flexible  fiberoptic laryngoscopy with stroboscopy (68420)    Surgeon: Elena Larry, MD   Anesthesia: Topical lidocaine  and Afrin   Complications: None   Condition is stable throughout exam   Indications and consent:   The patient presents to the clinic with hoarseness. All the risks, benefits, and potential complications were reviewed with the patient preoperatively and informed verbal consent was obtained.   Procedure: The patient was seated upright in the exam chair.   Topical lidocaine  and Afrin were applied to the nasal cavity. After adequate anesthesia had occurred, the flexible telescope with strobe capabilities was passed into the nasal cavity. The nasopharynx was patent without mass or lesion. The scope was passed behind the soft palate and directed toward the base of tongue. The base of tongue was visualized and was symmetric with no apparent masses or abnormal appearing tissue. There were no signs of a mass or pooling of secretions in the piriform sinuses. The supraglottic structures were normal.   The true vocal cords are mobile. The medial edges  were straight. Closure was complete but with severe supraglottic compression. Periodicity present. The mucosal wave and amplitude were slightly decreased 2/2 straight and supraglottic compression. There is moder ate interarytenoid pachydermia and post cricoid edema. The mucosa appears without lesions.   The laryngoscope was then slowly withdrawn and the patient tolerated the procedure well. There were no complications or blood loss.  Assessment & Plan Chronic cough We discussed common causes of chronic cough and neurogenic cough. Previous treatments for post-nasal drainage and reflux were ineffective. Was seen by Pulm before and diagnosed with cyclical cough. Normal chest x-ray 03/2024. - Review superior laryngeal nerve block information. - Xyzal  5 mg daily and Flonase , and nasal rinses daily - Consider superior laryngeal nerve block if symptoms  persist.   Environmental allergies and Post-nasal drainage  Postnasal drip contributing to chronic cough. Previous treatments ineffective. - Continue nasal saline rinses. - Start Flonase  nasal spray BID - Prescribed Xyzal . - continue allergy shots    Dysphonia Hoarseness x 7 months Strobe today with supraglottic compression, VF atrophy and glottic insufficiency. We discussed a trial of speech therapy - Refer to speech therapy.   RTC 3 months   PATIENT REPORTED OUTCOME MEASURES (PROM): [place] Cough Index: Scored herself 63/133, with lower scores indicating worse QoL and negative affects from pt's cough.                                                                                                                            TREATMENT DATE:  AB= Abdominal breathing; SOVTE=Semi-occluded vocal tract exercises, VFE=Vocal function exercises  08/04/24: Works on AB when trying to go to sleep at night. Today SLP worked with pt with flow phonation and SOVTE tasks; The most success with decr'ing voice quality common with supraglottic compression was straw phonation from high pitch to low pitch as well as high to low pitch whooo's _______? Sheron was also successful to bring pt's speech to more WNL sounding. Homework to use straw high to low pitch, and yawn-sigh for more WNL voicing. SLP used voice rest to mitigate effect of pt's chronic cough which is exacerbated by voice use.   07/28/24: After flow phonation practice last night pt stated she heard her WNL voice for 1 or 2 sentences afterwards. SLP ascertained pt also has sx of hypersensitivity to scent - pt has coughing with strong scents. Mild cough during Pt coughed last night during a meeting where she had to leave the meeting room. SLP had pt practice all chronic cough breathing strategies and stressed AB and sip/swallow to use as well. SLP guided pt through flow phonation practice and pt began coughing within 2 minutes of practice. Pt  reported she coughed within 5 minutes of practice with flow phonation since last session but has cont'd to practice with this despite that. Today WNL voice was heard after 4 reps of moo using tissue, preceeded by 8 reps of ooooo with tissue. Then pt began coughing.   07/26/24: SLP  provided pt another copy of cough index as she misplaced the first. Score as above. AB at rest for 5 minutes - 90% success. Meds for congestion helping with congested cough - no longer exists. Voice does not sound as strong - pt states her husband is asking her to repeat more in last 1-2 weeks.  SLP used flow phonation techniques and SOVTE to normalize pt's vocal quality. Blowing with tissue used as visual support - pt with 100% success, with vocalization and exhalation initial 0% success but pt became more successful as SLP continued cueing and demonstrating. SLP added /m/, /s/, and sh in front of oo for feeling of flow phonation. Pt's first sentence after practicing flow phonation was WNL voicing, but then reverted back to that indicative of compression after one sentence. Pt to practice flow phonation tasks covered in ST today for 15 minutes multiple times/day. AFTER 25 MINUTES OF FLOW PHONATION PRACTICE PT EXPERIENCED COUGH SPELL for approx 4 minutes. She used box breathing and pursed lip inhale/exhale for success in stopping sx.  07/20/24: Pt to bring cough index next session. WNL voice quality heard approx 60% today. Pt has cut all caff coffee except one cup with a little decaf in AM. Finds it hard to stop coughing as cough sneaks up on her -tried to use sniff/blow technique. Flonase  has dried up some of the mucous. SLP reviewed procedure for all chronic cough rescue breathing strategies and pt completing each incorrectly. SLP corrected pt's productions and pt was independent.  Pt admitted she has to think about which strategy to use when she starts coughing instead of spontaneously starting one. SLP told pt to cont to  practice strategies 20x each/daily to make this easier when she has an episode.   07/13/24: SLP worked with pt today with SOVTE and flow phonation exercises to reduce tension/compression when vocalizing. SLP used /m/, /n/, /u/, and /o/ words and pt initially with 30% improving to 85% success with WNL voicing with pitch variations. Still rough sounding in lower pitches - possibly due to vocal fold atrophy? Pt will practice phrases  at home as in pt instructions.   06/30/24: SLP educated pt on throat clear alternatives; pt began using water sips after two additional throat clears. SLP guided pt through strategies for chronic cough. SLP then started AB practice, which triggered chronic cough. SLP then guided pt through strategies for chronic cough in the moment, which quelled cough in 90 seconds. Homework to work on AB and to practice chronic cough strategies when not in coughing spell.   06/27/24: Not charged due to not authorized by insurance. SLP educated pt about AB and watched pt as she attempted AB. At rest pt began at 50% success but with occasional min A she incr'd success to 80%. She was told to practice this at home 10-15 minutes BID.  PATIENT EDUCATION: Education details: see treatment date this date, above Person educated: Patient Education method: Programmer, multimedia, Demonstration, and Verbal cues Education comprehension: verbalized understanding, returned demonstration, verbal cues required, and needs further education  HOME EXERCISE PROGRAM: AB but will eventually include SOVTE  GOALS: Goals reviewed with patient? Yes  SHORT TERM GOALS: Target date: 07/29/24  Pt will complete AB at rest with 85% success for 5 minutes, in 2 sessions Baseline: 07/26/24 Goal status: partially met  2.  Pt will complete AB in at least 17/20 sentence responses, in two sessions Baseline:  Goal status: not met - focus on compression  3.  To decr laryngeal compression,  pt will complete SOVTE tasks in session with  90% success in 2 sessions Baseline: 07/28/24 Goal status: met   LONG TERM GOALS: Target date: 08/26/24  Pt will improve PROM from initial administration Baseline:  Goal status: INITIAL  2.  Pt will reduce throat clears to <3 in 3 sessions, using compensations PRN Baseline:  Goal status: INITIAL  3.  Pt will exhibit WNL voice quality for 1 minute following SOVTE or other voice exercises in 2 sessions Baseline:  Goal status: INITIAL  4.  Pt will exhibit WNL voice quality for 5 minutes following SOVTE or other voice exercises in 3 sessions Baseline:  Goal status: INITIAL  5.  Pt will report being able to talk on the phone for 30 minutes without coughing episodes, between 2 sessions, using compensations PRN  Baseline:  Goal status: INITIAL   ASSESSMENT:  CLINICAL IMPRESSION: Patient is a 73 y.o. F who was seen today for treatment of voice. See treatment date above for today's date for further details on today's session. She will cont to need skilled ST both for chronic cough and for improving voice quality due to vocal fold atrophy and laryngeal compression.  OBJECTIVE IMPAIRMENTS: include voice disorder. These impairments are limiting patient from managing appointments, household responsibilities, ADLs/IADLs, and effectively communicating at home and in community. Factors affecting potential to achieve goals and functional outcome are severity of impairments.. Patient will benefit from skilled SLP services to address above impairments and improve overall function.  REHAB POTENTIAL: Good  PLAN:  SLP FREQUENCY: 2x/week  SLP DURATION: 8 weeks  PLANNED INTERVENTIONS: Internal/external aids, Functional tasks, SLP instruction and feedback, Compensatory strategies, Patient/family education, [phone] Treatment of speech (30 or 45 min) , and voice exercises    Raden Byington, CCC-SLP 08/04/2024, 3:35 PM

## 2024-08-08 ENCOUNTER — Ambulatory Visit

## 2024-08-08 DIAGNOSIS — R498 Other voice and resonance disorders: Secondary | ICD-10-CM

## 2024-08-08 NOTE — Therapy (Signed)
 OUTPATIENT SPEECH LANGUAGE PATHOLOGY VOICE TREATMENT   Patient Name: Theresa [facility] MRN: [id number] DOB:05-30-1951, 73 y.o., female Today's Date: 08/08/2024  PCP: Theresa Pin, MD REFERRING PROVIDER: Okey Burns, MD  END OF SESSION:  End of Session - 08/08/24 1551     Visit Number 8    Number of Visits 17    Date for SLP Re-Evaluation 08/26/24    SLP Start Time 1532    SLP Stop Time  1615    SLP Time Calculation (min) 43 min    Activity Tolerance Patient tolerated treatment well             Past Medical History:  Diagnosis Date   Anxiety    Breast cancer (HCC) 2009   right   Breast cancer (HCC) 2021   Cancer (HCC)    COUGH, CHRONIC 05/10/2010   Dysrhythmia    occationally skips a beat   Family history of brain cancer    Family history of breast cancer    Family history of lung cancer    Family history of multiple myeloma    Family history of skin cancer    GERD (gastroesophageal reflux disease)    HYPERTENSION 04/16/2009   HYPOTHYROIDISM 04/16/2009   OSTEOARTHRITIS 04/16/2009   Personal history of chemotherapy 2021   Personal history of radiation therapy 2009   SENILE LENTIGO 06/10/2010   Past Surgical History:  Procedure Laterality Date   BREAST EXCISIONAL BIOPSY Right    BREAST EXCISIONAL BIOPSY Left    BREAST LUMPECTOMY Right 2009   radiation only   BREAST LUMPECTOMY WITH RADIOACTIVE SEED AND SENTINEL LYMPH NODE BIOPSY Right 01/31/2020   Procedure: RIGHT BREAST LUMPECTOMY WITH RADIOACTIVE SEED AND SENTINEL LYMPH NODE BIOPSY;  Surgeon: Theresa Ned, MD;  Location: [facility];  Service: General;  Laterality: Right;  PEC BLOCK   BREAST SURGERY  2009   X 2, cancer stage 0, lumpectomy   CESAREAN SECTION     1979, 1982   CHOLECYSTECTOMY     COLONOSCOPY     DILATATION & CURETTAGE/HYSTEROSCOPY WITH MYOSURE  11/2015   HERNIA REPAIR  2009   JOINT REPLACEMENT  2008   hip   MASTECTOMY Right 2021   OPEN SURGICAL REPAIR OF  GLUTEAL TENDON Left 07/18/2019   Procedure: Left hip bearing surface revision with gluteal tendon repair;  Surgeon: Theresa Lerner, MD;  Location: [facility];  Service: Orthopedics;  Laterality: Left;  2 hrs   PORTACATH PLACEMENT N/A 03/15/2020   Procedure: INSERTION PORT-A-CATH WITH ULTRASOUND GUIDANCE;  Surgeon: Theresa Ned, MD;  Location: [facility];  Service: General;  Laterality: N/A;   SIMPLE MASTECTOMY WITH AXILLARY SENTINEL NODE BIOPSY Right 03/15/2020   Procedure: RIGHT SIMPLE MASTECTOMY;  Surgeon: Theresa Ned, MD;  Location: [facility];  Service: General;  Laterality: Right;   Patient Active Problem List   Diagnosis Date Noted   Port-A-Cath in place 05/15/2020   Genetic testing 02/03/2020   Family history of breast cancer    Family history of skin cancer    Family history of brain cancer    Family history of lung cancer    Family history of multiple myeloma    Malignant neoplasm of upper-outer quadrant of right breast in female, estrogen receptor positive (HCC) 01/04/2020   Melanoma of skin (HCC) 08/15/2019   Failed total hip arthroplasty, 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   Cough 05/31/2014   GERD (gastroesophageal reflux disease) 09/13/2012   Hypothyroidism  04/16/2009   Essential hypertension 04/16/2009   Osteoarthritis 04/16/2009    Onset date: years ago script dated 06/17/24  REFERRING DIAG: R05.3 (ICD-10-CM) - Chronic cough R49.0 (ICD-10-CM) - Dysphonia 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 I thought I smelled perfume.   Pt accompanied by: self  PERTINENT HISTORY:  From [facility]'s note 06/17/24: [name] is a 73 year old female with chronic cough who presents with persistent cough and voice changes. She was referred by Dr. Fleeta Smock Allergies for evaluation of her chronic cough for several years and voice changes/dysphonia for several months   She has experienced a chronic cough for many years, which worsened after an illness in December 2024. She also experienced persistent hoarseness at the same time, and has had it since. The cough is associated with phlegm production, sometimes green in color, and occasional throat discomfort. A chest x-ray performed in May was normal. She has a history of asthma and has been prescribed an emergency inhaler, which she has not needed to use in the past month and a half. During her worst symptoms, she coughed up significant amounts of phlegm, which has since decreased but still occurs. She occasionally experiences throat pain, described as being lower in the throat, and has had episodes of difficulty swallowing, though these are not frequent.   Her voice has changed since December. She has a history of allergies and has been receiving allergy shots for years, though she stopped in May due to illness and antibiotic use. She has tried various antihistamines, including Zyrtec and Claritin, without significant relief. She performs daily nasal saline rinses and has a bottle of Flonase .    In the past, she consulted a pulmonologist who diagnosed her with a cyclical cough and provided treatment, but the cough returned. This was approximately seven or eight years ago.  PAIN:  Are you having pain? No  FALLS: Has patient fallen in last 6 months? No  PATIENT GOALS: Address voice concerns  OBJECTIVE:  Note: Objective measures were completed at Evaluation unless otherwise noted.  DIAGNOSTIC FINDINGS:  [facility] 06/17/24: Procedure: Preoperative diagnosis: hoarseness   Postoperative diagnosis:   same VF atrophy and glottic insufficiency   Procedure: Flexible  fiberoptic laryngoscopy with stroboscopy (68420)    Surgeon: Theresa Larry, MD   Anesthesia: Topical lidocaine  and Afrin   Complications: None   Condition is stable throughout exam   Indications and consent:   The patient presents to the clinic with hoarseness. All the risks, benefits, and potential complications were reviewed with the patient preoperatively and informed verbal consent was obtained.   Procedure: The patient was seated upright in the exam chair.   Topical lidocaine  and Afrin were applied to the nasal cavity. After adequate anesthesia had occurred, the flexible telescope with strobe capabilities was passed into the nasal cavity. The nasopharynx was patent without mass or lesion. The scope was passed behind the soft palate and directed toward the base of tongue. The base of tongue was visualized and was symmetric with no apparent masses or abnormal appearing tissue. There were no signs of a mass or pooling of secretions in the piriform sinuses. The supraglottic structures were normal.   The true vocal cords are mobile. The medial edges were  straight. Closure was complete but with severe supraglottic compression. Periodicity present. The mucosal wave and amplitude were slightly decreased 2/2 straight and supraglottic compression. There is moder ate interarytenoid pachydermia and post cricoid edema. The mucosa appears without lesions.   The laryngoscope was then slowly withdrawn and the patient tolerated the procedure well. There were no complications or blood loss.  Assessment & Plan Chronic cough We discussed common causes of chronic cough and neurogenic cough. Previous treatments for post-nasal drainage and reflux were ineffective. Was seen by Pulm before and diagnosed with cyclical cough. Normal chest x-ray 03/2024. - Review superior laryngeal nerve block information. - Xyzal  5 mg daily and Flonase , and nasal rinses daily - Consider superior laryngeal nerve block if symptoms  persist.   Environmental allergies and Post-nasal drainage  Postnasal drip contributing to chronic cough. Previous treatments ineffective. - Continue nasal saline rinses. - Start Flonase  nasal spray BID - Prescribed Xyzal . - continue allergy shots    Dysphonia Hoarseness x 7 months Strobe today with supraglottic compression, VF atrophy and glottic insufficiency. We discussed a trial of speech therapy - Refer to speech therapy.   RTC 3 months   PATIENT REPORTED OUTCOME MEASURES (PROM): [place] Cough Index: Scored herself 63/133, with lower scores indicating worse QoL and negative affects from pt's cough.                                                                                                                            TREATMENT DATE:  AB= Abdominal breathing; SOVTE=Semi-occluded vocal tract exercises, VFE=Vocal function exercises  08/08/24: Cough spell lasting 3-4 minutes as [name] entered lobby. Entered clearing throat - total today 5- were very mildly harsh - none were harsh; Pt tapped vocal folds as a throat clear alternative 15 times and drank water. Pt with little talking today - total 15 minutes. SLP assisted pt with resonant voicing and SOVTE with straw phonation, whooo, and whooooos ______, vocal glides, and AB. Pt has been practicing these at home; Each time pt was able to change her voicing to WNL quality. She also accomplished this today during session x3 after 2-3 minutes of vocal glides with straw and without straw. SLP reiterated to pt why practice is important with this on a daily basis. Pt to cont this work at home.  08/04/24: Works on AB when trying to go to sleep at night. Today SLP worked with pt with flow phonation and SOVTE tasks; The most success with decr'ing voice quality common with supraglottic compression was straw phonation from high pitch to low pitch as well as high to low pitch whooo's _______? Sheron was also successful to bring pt's speech  to more WNL sounding. Homework to use straw high to low pitch, and yawn-sigh for more WNL voicing. SLP used voice rest to mitigate effect of pt's chronic cough which is exacerbated by voice use.   07/28/24: After flow phonation practice last night pt stated she heard her WNL voice for  1 or 2 sentences afterwards. SLP ascertained pt also has sx of hypersensitivity to scent - pt has coughing with strong scents. Mild cough during Pt coughed last night during a meeting where she had to leave the meeting room. SLP had pt practice all chronic cough breathing strategies and stressed AB and sip/swallow to use as well. SLP guided pt through flow phonation practice and pt began coughing within 2 minutes of practice. Pt reported she coughed within 5 minutes of practice with flow phonation since last session but has cont'd to practice with this despite that. Today WNL voice was heard after 4 reps of moo using tissue, preceeded by 8 reps of ooooo with tissue. Then pt began coughing.   07/26/24: SLP provided pt another copy of cough index as she misplaced the first. Score as above. AB at rest for 5 minutes - 90% success. Meds for congestion helping with congested cough - no longer exists. Voice does not sound as strong - pt states her husband is asking her to repeat more in last 1-2 weeks.  SLP used flow phonation techniques and SOVTE to normalize pt's vocal quality. Blowing with tissue used as visual support - pt with 100% success, with vocalization and exhalation initial 0% success but pt became more successful as SLP continued cueing and demonstrating. SLP added /m/, /s/, and sh in front of oo for feeling of flow phonation. Pt's first sentence after practicing flow phonation was WNL voicing, but then reverted back to that indicative of compression after one sentence. Pt to practice flow phonation tasks covered in ST today for 15 minutes multiple times/day. AFTER 25 MINUTES OF FLOW PHONATION PRACTICE PT EXPERIENCED  COUGH SPELL for approx 4 minutes. She used box breathing and pursed lip inhale/exhale for success in stopping sx.  07/20/24: Pt to bring cough index next session. WNL voice quality heard approx 60% today. Pt has cut all caff coffee except one cup with a little decaf in AM. Finds it hard to stop coughing as cough sneaks up on her -tried to use sniff/blow technique. Flonase  has dried up some of the mucous. SLP reviewed procedure for all chronic cough rescue breathing strategies and pt completing each incorrectly. SLP corrected pt's productions and pt was independent.  Pt admitted she has to think about which strategy to use when she starts coughing instead of spontaneously starting one. SLP told pt to cont to practice strategies 20x each/daily to make this easier when she has an episode.   07/13/24: SLP worked with pt today with SOVTE and flow phonation exercises to reduce tension/compression when vocalizing. SLP used /m/, /n/, /u/, and /o/ words and pt initially with 30% improving to 85% success with WNL voicing with pitch variations. Still rough sounding in lower pitches - possibly due to vocal fold atrophy? Pt will practice phrases  at home as in pt instructions.   06/30/24: SLP educated pt on throat clear alternatives; pt began using water sips after two additional throat clears. SLP guided pt through strategies for chronic cough. SLP then started AB practice, which triggered chronic cough. SLP then guided pt through strategies for chronic cough in the moment, which quelled cough in 90 seconds. Homework to work on AB and to practice chronic cough strategies when not in coughing spell.   06/27/24: Not charged due to not authorized by insurance. SLP educated pt about AB and watched pt as she attempted AB. At rest pt began at 50% success but with occasional min A she incr'd success  to 80%. She was told to practice this at home 10-15 minutes BID.  PATIENT EDUCATION: Education details: see treatment date this  date, above Person educated: Patient Education method: Programmer, multimedia, Demonstration, and Verbal cues Education comprehension: verbalized understanding, returned demonstration, verbal cues required, and needs further education  HOME EXERCISE PROGRAM: AB but will eventually include SOVTE  GOALS: Goals reviewed with patient? Yes  SHORT TERM GOALS: Target date: 07/29/24  Pt will complete AB at rest with 85% success for 5 minutes, in 2 sessions Baseline: 07/26/24 Goal status: partially met  2.  Pt will complete AB in at least 17/20 sentence responses, in two sessions Baseline:  Goal status: not met - focus on compression  3.  To decr laryngeal compression, pt will complete SOVTE tasks in session with 90% success in 2 sessions Baseline: 07/28/24 Goal status: met   LONG TERM GOALS: Target date: 08/26/24  Pt will improve PROM from initial administration Baseline:  Goal status: INITIAL  2.  Pt will reduce throat clears to <3 in 3 sessions, using compensations PRN Baseline:  Goal status: INITIAL  3.  Pt will exhibit WNL voice quality for 1 minute following SOVTE or other voice exercises in 2 sessions Baseline:  Goal status: INITIAL  4.  Pt will exhibit WNL voice quality for 5 minutes following SOVTE or other voice exercises in 3 sessions Baseline:  Goal status: INITIAL  5.  Pt will report being able to talk on the phone for 30 minutes without coughing episodes, between 2 sessions, using compensations PRN  Baseline:  Goal status: INITIAL   ASSESSMENT:  CLINICAL IMPRESSION: Patient is a 73 y.o. F who was seen today for treatment of voice. See treatment date above for today's date for further details on today's session. She will cont to need skilled ST both for chronic cough and for improving voice quality due to vocal fold atrophy and laryngeal compression.  OBJECTIVE IMPAIRMENTS: include voice disorder. These impairments are limiting patient from managing appointments, household  responsibilities, ADLs/IADLs, and effectively communicating at home and in community. Factors affecting potential to achieve goals and functional outcome are severity of impairments.. Patient will benefit from skilled SLP services to address above impairments and improve overall function.  REHAB POTENTIAL: Good  PLAN:  SLP FREQUENCY: 2x/week  SLP DURATION: 8 weeks  PLANNED INTERVENTIONS: Internal/external aids, Functional tasks, SLP instruction and feedback, Compensatory strategies, Patient/family education, [phone] Treatment of speech (30 or 45 min) , and voice exercises    Traveion Ruddock, CCC-SLP 08/08/2024, 3:51 PM

## 2024-08-11 ENCOUNTER — Ambulatory Visit

## 2024-08-11 DIAGNOSIS — R498 Other voice and resonance disorders: Secondary | ICD-10-CM

## 2024-08-11 NOTE — Therapy (Signed)
 OUTPATIENT SPEECH LANGUAGE PATHOLOGY VOICE TREATMENT   Patient Name: Theresa Bradley MRN: [id number] DOB:[birth year], 73 y.o., female Today's Date: 08/11/2024  PCP: Micheal Pin, MD REFERRING PROVIDER: Okey Burns, MD  END OF SESSION:  End of Session - 08/11/24 1535     Visit Number 9    Number of Visits 17    Date for Recertification  08/26/24    SLP Start Time 1534    SLP Stop Time  1613    SLP Time Calculation (min) 39 min    Activity Tolerance Patient tolerated treatment well              Past Medical History:  Diagnosis Date   Anxiety    Breast cancer (HCC) 2009   right   Breast cancer (HCC) 2021   Cancer (HCC)    COUGH, CHRONIC 05/10/2010   Dysrhythmia    occationally skips a beat   Family history of brain cancer    Family history of breast cancer    Family history of lung cancer    Family history of multiple myeloma    Family history of skin cancer    GERD (gastroesophageal reflux disease)    HYPERTENSION 04/16/2009   HYPOTHYROIDISM 04/16/2009   OSTEOARTHRITIS 04/16/2009   Personal history of chemotherapy 2021   Personal history of radiation therapy 2009   SENILE LENTIGO 06/10/2010   Past Surgical History:  Procedure Laterality Date   BREAST EXCISIONAL BIOPSY Right    BREAST EXCISIONAL BIOPSY Left    BREAST LUMPECTOMY Right 2009   radiation only   BREAST LUMPECTOMY WITH RADIOACTIVE SEED AND SENTINEL LYMPH NODE BIOPSY Right 01/31/2020   Procedure: RIGHT BREAST LUMPECTOMY WITH RADIOACTIVE SEED AND SENTINEL LYMPH NODE BIOPSY;  Surgeon: Vanderbilt Ned, MD;  Location: [facility];  Service: General;  Laterality: Right;  PEC BLOCK   BREAST SURGERY  2009   X 2, cancer stage 0, lumpectomy   CESAREAN SECTION     1979, 1982   CHOLECYSTECTOMY     COLONOSCOPY     DILATATION & CURETTAGE/HYSTEROSCOPY WITH MYOSURE  11/2015   HERNIA REPAIR  2009   JOINT REPLACEMENT  2008   hip   MASTECTOMY Right 2021   OPEN SURGICAL REPAIR OF  GLUTEAL TENDON Left 07/18/2019   Procedure: Left hip bearing surface revision with gluteal tendon repair;  Surgeon: Melodi Lerner, MD;  Location: [facility];  Service: Orthopedics;  Laterality: Left;  2 hrs   PORTACATH PLACEMENT N/A 03/15/2020   Procedure: INSERTION PORT-A-CATH WITH ULTRASOUND GUIDANCE;  Surgeon: Vanderbilt Ned, MD;  Location: [facility];  Service: General;  Laterality: N/A;   SIMPLE MASTECTOMY WITH AXILLARY SENTINEL NODE BIOPSY Right 03/15/2020   Procedure: RIGHT SIMPLE MASTECTOMY;  Surgeon: Vanderbilt Ned, MD;  Location: [facility];  Service: General;  Laterality: Right;   Patient Active Problem List   Diagnosis Date Noted   Port-A-Cath in place 05/15/2020   Genetic testing 02/03/2020   Family history of breast cancer    Family history of skin cancer    Family history of brain cancer    Family history of lung cancer    Family history of multiple myeloma    Malignant neoplasm of upper-outer quadrant of right breast in female, estrogen receptor positive (HCC) 01/04/2020   Melanoma of skin (HCC) 08/15/2019   Failed total hip arthroplasty, 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   Cough 05/31/2014   GERD (gastroesophageal reflux disease) 09/13/2012  Hypothyroidism 04/16/2009   Essential hypertension 04/16/2009   Osteoarthritis 04/16/2009    Onset date: years ago script dated 06/17/24  REFERRING DIAG: R05.3 (ICD-10-CM) - Chronic cough R49.0 (ICD-10-CM) - Dysphonia 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 I thought I smelled perfume.   Pt accompanied by: self  PERTINENT HISTORY:  From [place]'s note 06/17/24: Theresa Bradley is a 73 year old female with chronic cough who presents with persistent cough and voice changes. She was referred by Dr. Fleeta Smock Allergies for evaluation of her chronic cough for several years and voice changes/dysphonia for several months   She has experienced a chronic cough for many years, which worsened after an illness in December 2024. She also experienced persistent hoarseness at the same time, and has had it since. The cough is associated with phlegm production, sometimes green in color, and occasional throat discomfort. A chest x-ray performed in May was normal. She has a history of asthma and has been prescribed an emergency inhaler, which she has not needed to use in the past month and a half. During her worst symptoms, she coughed up significant amounts of phlegm, which has since decreased but still occurs. She occasionally experiences throat pain, described as being lower in the throat, and has had episodes of difficulty swallowing, though these are not frequent.   Her voice has changed since December. She has a history of allergies and has been receiving allergy shots for years, though she stopped in May due to illness and antibiotic use. She has tried various antihistamines, including Zyrtec and Claritin, without significant relief. She performs daily nasal saline rinses and has a bottle of Flonase .    In the past, she consulted a pulmonologist who diagnosed her with a cyclical cough and provided treatment, but the cough returned. This was approximately seven or eight years ago.  PAIN:  Are you having pain? No  FALLS: Has patient fallen in last 6 months? No  PATIENT GOALS: Address voice concerns  OBJECTIVE:  Note: Objective measures were completed at Evaluation unless otherwise noted.  DIAGNOSTIC FINDINGS:  [place] 06/17/24: Procedure: Preoperative diagnosis: hoarseness   Postoperative diagnosis:   same VF atrophy and glottic insufficiency   Procedure: Flexible  fiberoptic laryngoscopy with stroboscopy (68420)    Surgeon: Elena Larry, MD   Anesthesia: Topical lidocaine  and Afrin   Complications: None   Condition is stable throughout exam   Indications and consent:   The patient presents to the clinic with hoarseness. All the risks, benefits, and potential complications were reviewed with the patient preoperatively and informed verbal consent was obtained.   Procedure: The patient was seated upright in the exam chair.   Topical lidocaine  and Afrin were applied to the nasal cavity. After adequate anesthesia had occurred, the flexible telescope with strobe capabilities was passed into the nasal cavity. The nasopharynx was patent without mass or lesion. The scope was passed behind the soft palate and directed toward the base of tongue. The base of tongue was visualized and was symmetric with no apparent masses or abnormal appearing tissue. There were no signs of a mass or pooling of secretions in the piriform sinuses. The supraglottic structures were normal.   The true vocal cords are mobile. The medial edges  were straight. Closure was complete but with severe supraglottic compression. Periodicity present. The mucosal wave and amplitude were slightly decreased 2/2 straight and supraglottic compression. There is moder ate interarytenoid pachydermia and post cricoid edema. The mucosa appears without lesions.   The laryngoscope was then slowly withdrawn and the patient tolerated the procedure well. There were no complications or blood loss.  Assessment & Plan Chronic cough We discussed common causes of chronic cough and neurogenic cough. Previous treatments for post-nasal drainage and reflux were ineffective. Was seen by Pulm before and diagnosed with cyclical cough. Normal chest x-ray 03/2024. - Review superior laryngeal nerve block information. - Xyzal  5 mg daily and Flonase , and nasal rinses daily - Consider superior laryngeal nerve block if symptoms  persist.   Environmental allergies and Post-nasal drainage  Postnasal drip contributing to chronic cough. Previous treatments ineffective. - Continue nasal saline rinses. - Start Flonase  nasal spray BID - Prescribed Xyzal . - continue allergy shots    Dysphonia Hoarseness x 7 months Strobe today with supraglottic compression, VF atrophy and glottic insufficiency. We discussed a trial of speech therapy - Refer to speech therapy.   RTC 3 months   PATIENT REPORTED OUTCOME MEASURES (PROM): [place] Cough Index: Scored herself 63/133, with lower scores indicating worse QoL and negative affects from pt's cough.                                                                                                                            TREATMENT DATE:  AB= Abdominal breathing; SOVTE=Semi-occluded vocal tract exercises, VFE=Vocal function exercises  08/11/24: Pt stated she used her straw phonation prior to talking with her brother, and her mother yesterday and heard a WNL voice for 3-5 minutes afterwards. Today she used straw phonation and did demonstrate WNL voice afterwards for approx 2-3 minutes. When pt noticed harsh/hoarse voice she used straw again and this cycle continued. As the session progressed however, the WNL voicing times decr'd in length, maybe due to fatigue. SLP again explained pt also has vocal fold atrophy which might lead to fatigue quicker than if she did not have atrophy. Pt will use straw throughout the day to foster WNL voicing.  08/08/24: Cough spell lasting 3-4 minutes as [name] entered lobby. Entered clearing throat - total today 5- were very mildly harsh - none were harsh; Pt tapped vocal folds as a throat clear alternative 15 times and drank water. Pt with little talking today - total 15 minutes. SLP assisted pt with resonant voicing and SOVTE with straw phonation, whooo, and whooooos ______, vocal glides, and AB. Pt has been practicing these at home; Each time pt was  able to change her voicing to WNL quality. She also accomplished this today during session x3 after 2-3 minutes of vocal glides with straw and without straw. SLP reiterated to pt why practice is important with this on a daily basis. Pt to cont this work at home.  08/04/24: Works on AB when trying  to go to sleep at night. Today SLP worked with pt with flow phonation and SOVTE tasks; The most success with decr'ing voice quality common with supraglottic compression was straw phonation from high pitch to low pitch as well as high to low pitch whooo's _______? Sheron was also successful to bring pt's speech to more WNL sounding. Homework to use straw high to low pitch, and yawn-sigh for more WNL voicing. SLP used voice rest to mitigate effect of pt's chronic cough which is exacerbated by voice use.   07/28/24: After flow phonation practice last night pt stated she heard her WNL voice for 1 or 2 sentences afterwards. SLP ascertained pt also has sx of hypersensitivity to scent - pt has coughing with strong scents. Mild cough during Pt coughed last night during a meeting where she had to leave the meeting room. SLP had pt practice all chronic cough breathing strategies and stressed AB and sip/swallow to use as well. SLP guided pt through flow phonation practice and pt began coughing within 2 minutes of practice. Pt reported she coughed within 5 minutes of practice with flow phonation since last session but has cont'd to practice with this despite that. Today WNL voice was heard after 4 reps of moo using tissue, preceeded by 8 reps of ooooo with tissue. Then pt began coughing.   07/26/24: SLP provided pt another copy of cough index as she misplaced the first. Score as above. AB at rest for 5 minutes - 90% success. Meds for congestion helping with congested cough - no longer exists. Voice does not sound as strong - pt states her husband is asking her to repeat more in last 1-2 weeks.  SLP used flow phonation  techniques and SOVTE to normalize pt's vocal quality. Blowing with tissue used as visual support - pt with 100% success, with vocalization and exhalation initial 0% success but pt became more successful as SLP continued cueing and demonstrating. SLP added /m/, /s/, and sh in front of oo for feeling of flow phonation. Pt's first sentence after practicing flow phonation was WNL voicing, but then reverted back to that indicative of compression after one sentence. Pt to practice flow phonation tasks covered in ST today for 15 minutes multiple times/day. AFTER 25 MINUTES OF FLOW PHONATION PRACTICE PT EXPERIENCED COUGH SPELL for approx 4 minutes. She used box breathing and pursed lip inhale/exhale for success in stopping sx.  07/20/24: Pt to bring cough index next session. WNL voice quality heard approx 60% today. Pt has cut all caff coffee except one cup with a little decaf in AM. Finds it hard to stop coughing as cough sneaks up on her -tried to use sniff/blow technique. Flonase  has dried up some of the mucous. SLP reviewed procedure for all chronic cough rescue breathing strategies and pt completing each incorrectly. SLP corrected pt's productions and pt was independent.  Pt admitted she has to think about which strategy to use when she starts coughing instead of spontaneously starting one. SLP told pt to cont to practice strategies 20x each/daily to make this easier when she has an episode.   07/13/24: SLP worked with pt today with SOVTE and flow phonation exercises to reduce tension/compression when vocalizing. SLP used /m/, /n/, /u/, and /o/ words and pt initially with 30% improving to 85% success with WNL voicing with pitch variations. Still rough sounding in lower pitches - possibly due to vocal fold atrophy? Pt will practice phrases  at home as in pt instructions.   06/30/24:  SLP educated pt on throat clear alternatives; pt began using water sips after two additional throat clears. SLP guided pt through  strategies for chronic cough. SLP then started AB practice, which triggered chronic cough. SLP then guided pt through strategies for chronic cough in the moment, which quelled cough in 90 seconds. Homework to work on AB and to practice chronic cough strategies when not in coughing spell.   06/27/24: Not charged due to not authorized by insurance. SLP educated pt about AB and watched pt as she attempted AB. At rest pt began at 50% success but with occasional min A she incr'd success to 80%. She was told to practice this at home 10-15 minutes BID.  PATIENT EDUCATION: Education details: see treatment date this date, above Person educated: Patient Education method: Programmer, multimedia, Demonstration, and Verbal cues Education comprehension: verbalized understanding, returned demonstration, verbal cues required, and needs further education  HOME EXERCISE PROGRAM: AB but will eventually include SOVTE  GOALS: Goals reviewed with patient? Yes  SHORT TERM GOALS: Target date: 07/29/24  Pt will complete AB at rest with 85% success for 5 minutes, in 2 sessions Baseline: 07/26/24 Goal status: partially met  2.  Pt will complete AB in at least 17/20 sentence responses, in two sessions Baseline:  Goal status: not met - focus on compression  3.  To decr laryngeal compression, pt will complete SOVTE tasks in session with 90% success in 2 sessions Baseline: 07/28/24 Goal status: met   LONG TERM GOALS: Target date: 08/26/24  Pt will improve PROM from initial administration Baseline:  Goal status: INITIAL  2.  Pt will reduce throat clears to <3 in 3 sessions, using compensations PRN Baseline:  Goal status: INITIAL  3.  Pt will exhibit WNL voice quality for 1 minute following SOVTE or other voice exercises in 2 sessions Baseline:  Goal status: INITIAL  4.  Pt will exhibit WNL voice quality for 5 minutes following SOVTE or other voice exercises in 3 sessions Baseline:  Goal status: INITIAL  5.  Pt will  report being able to talk on the phone for 30 minutes without coughing episodes, between 2 sessions, using compensations PRN  Baseline:  Goal status: INITIAL   ASSESSMENT:  CLINICAL IMPRESSION: Patient is a 73 y.o. F who was seen today for treatment of voice. See treatment date above for today's date for further details on today's session. Pt is seeing success with feeling/hearing WNL voicing using SOVTE, and is making WNL voice quality in short conversational segments following straw phonation. She will cont to need skilled ST both for chronic cough and for improving voice quality due to vocal fold atrophy and laryngeal compression.  OBJECTIVE IMPAIRMENTS: include voice disorder. These impairments are limiting patient from managing appointments, household responsibilities, ADLs/IADLs, and effectively communicating at home and in community. Factors affecting potential to achieve goals and functional outcome are severity of impairments.. Patient will benefit from skilled SLP services to address above impairments and improve overall function.  REHAB POTENTIAL: Good  PLAN:  SLP FREQUENCY: 2x/week  SLP DURATION: 8 weeks  PLANNED INTERVENTIONS: Internal/external aids, Functional tasks, SLP instruction and feedback, Compensatory strategies, Patient/family education, [phone] Treatment of speech (30 or 45 min) , and voice exercises    Wilmina Maxham, CCC-SLP 08/11/2024, 4:14 PM

## 2024-08-15 ENCOUNTER — Encounter

## 2024-08-18 ENCOUNTER — Ambulatory Visit

## 2024-08-22 ENCOUNTER — Ambulatory Visit

## 2024-08-25 ENCOUNTER — Encounter

## 2024-09-02 ENCOUNTER — Other Ambulatory Visit: Payer: Self-pay | Admitting: Family Medicine

## 2024-09-07 ENCOUNTER — Ambulatory Visit

## 2024-09-07 DIAGNOSIS — Z23 Encounter for immunization: Secondary | ICD-10-CM

## 2024-09-10 ENCOUNTER — Other Ambulatory Visit: Payer: Self-pay | Admitting: Family Medicine

## 2024-09-13 ENCOUNTER — Ambulatory Visit

## 2024-09-14 ENCOUNTER — Other Ambulatory Visit: Payer: Self-pay | Admitting: Family Medicine

## 2024-09-19 ENCOUNTER — Ambulatory Visit

## 2024-09-20 ENCOUNTER — Ambulatory Visit (INDEPENDENT_AMBULATORY_CARE_PROVIDER_SITE_OTHER): Admitting: Otolaryngology

## 2024-09-20 ENCOUNTER — Ambulatory Visit: Attending: Otolaryngology

## 2024-09-22 ENCOUNTER — Ambulatory Visit (INDEPENDENT_AMBULATORY_CARE_PROVIDER_SITE_OTHER)

## 2024-09-22 VITALS — BP 127/77 | HR 53 | Temp 98.0°F | Wt 142.0 lb

## 2024-09-22 DIAGNOSIS — R053 Chronic cough: Secondary | ICD-10-CM | POA: Diagnosis not present

## 2024-09-22 DIAGNOSIS — R490 Dysphonia: Secondary | ICD-10-CM

## 2024-09-22 DIAGNOSIS — R0982 Postnasal drip: Secondary | ICD-10-CM

## 2024-09-22 DIAGNOSIS — J309 Allergic rhinitis, unspecified: Secondary | ICD-10-CM | POA: Diagnosis not present

## 2024-09-22 NOTE — Progress Notes (Signed)
 HPI:  Discussed the use of AI scribe software for clinical note transcription with the patient, who gave verbal consent to proceed.  History of Present Illness Theresa Bradley is a 73 year old female who presents with persistent voice changes and chronic cough.  She has experienced a chronic cough for several years, which is persistent and bothersome. The cough is accompanied by a sensation of needing to clear her throat constantly, and she often coughs up green phlegm almost daily. She has been treated with antibiotics in January and May, which did not resolve the issue. The allergist ordered a chest X-ray in May, and the patient reports that it 'came back okay.'  In December of last year, she began experiencing significant voice changes, losing her 'real voice' and struggling to sustain it. She has been attending neuro rehab with [facility], who helps her regain her voice temporarily through exercises, but she cannot maintain it. She describes her voice as strained and reports that people often think she is sick when they hear her speak. Despite these treatments, she continues to experience episodes of losing her voice, most recently at the end of September, when she lost her voice for two days and could only whisper the following day.  She has been using cetirizine at night and Flonase  nasal spray, which has helped reduce some phlegm but not completely. Earlier this year, she experienced asthma symptoms due to excessive phlegm and violent coughing, for which she was given an emergency inhaler by her allergist. However, she has not needed to use it for several months.  She is sensitive to smells such as perfumes and cleaning products, which can exacerbate her symptoms. She uses small humidifiers at home to help manage her symptoms.     PMH/Meds/All/SocHx/FamHx/ROS: Past Medical History:  Diagnosis Date   Anxiety    Breast cancer (HCC) 2009   right   Breast cancer (HCC) 2021   Cancer  (HCC)    COUGH, CHRONIC 05/10/2010   Dysrhythmia    occationally skips a beat   Family history of brain cancer    Family history of breast cancer    Family history of lung cancer    Family history of multiple myeloma    Family history of skin cancer    GERD (gastroesophageal reflux disease)    HYPERTENSION 04/16/2009   HYPOTHYROIDISM 04/16/2009   OSTEOARTHRITIS 04/16/2009   Personal history of chemotherapy 2021   Personal history of radiation therapy 2009   SENILE LENTIGO 06/10/2010   Past Surgical History:  Procedure Laterality Date   BREAST EXCISIONAL BIOPSY Right    BREAST EXCISIONAL BIOPSY Left    BREAST LUMPECTOMY Right 2009   radiation only   BREAST LUMPECTOMY WITH RADIOACTIVE SEED AND SENTINEL LYMPH NODE BIOPSY Right 01/31/2020   Procedure: RIGHT BREAST LUMPECTOMY WITH RADIOACTIVE SEED AND SENTINEL LYMPH NODE BIOPSY;  Surgeon: Vanderbilt Ned, MD;  Location: [facility];  Service: General;  Laterality: Right;  PEC BLOCK   BREAST SURGERY  2009   X 2, cancer stage 0, lumpectomy   CESAREAN SECTION     1979, 1982   CHOLECYSTECTOMY     COLONOSCOPY     DILATATION & CURETTAGE/HYSTEROSCOPY WITH MYOSURE  11/2015   HERNIA REPAIR  2009   JOINT REPLACEMENT  2008   hip   MASTECTOMY Right 2021   OPEN SURGICAL REPAIR OF GLUTEAL TENDON Left 07/18/2019   Procedure: Left hip bearing surface revision with gluteal tendon repair;  Surgeon: Melodi Lerner, MD;  Location: [facility];  Service: Orthopedics;  Laterality: Left;  2 hrs   PORTACATH PLACEMENT N/A 03/15/2020   Procedure: INSERTION PORT-A-CATH WITH ULTRASOUND GUIDANCE;  Surgeon: Vanderbilt Ned, MD;  Location: [facility];  Service: General;  Laterality: N/A;   SIMPLE MASTECTOMY WITH AXILLARY SENTINEL NODE BIOPSY Right 03/15/2020   Procedure: RIGHT SIMPLE MASTECTOMY;  Surgeon: Vanderbilt Ned, MD;  Location: [facility];  Service: General;  Laterality: Right;   No family history of bleeding  disorders, wound healing problems or difficulty with anesthesia.  Social Connections: Unknown (09/14/2023)   Social Connection and Isolation Panel    Frequency of Communication with Friends and Family: More than three times a week    Frequency of Social Gatherings with Friends and Family: Once a week    Attends Religious Services: Patient declined    Database Administrator or Organizations: No    Attends Engineer, Structural: Not on file    Marital Status: Married    Current Outpatient Medications:    ALPRAZolam  (XANAX ) 0.25 MG tablet, TAKE 1/2 TABLET BY MOUTH AT BEDTIME AS NEEDED FOR INSOMNIA, Disp: 45 tablet, Rfl: 0   amLODipine  (NORVASC ) 5 MG tablet, TAKE 1 TABLET (5 MG TOTAL) BY MOUTH DAILY., Disp: 90 tablet, Rfl: 0   CALCIUM-VITAMIN D PO, Take 2 tablets by mouth daily., Disp: , Rfl:    citalopram  (CELEXA ) 20 MG tablet, TAKE 1 TABLET BY MOUTH EVERY DAY, Disp: 90 tablet, Rfl: 1   EPIPEN  2-PAK 0.3 MG/0.3ML SOAJ injection, Inject 0.3 mg into the muscle as needed for anaphylaxis. , Disp: , Rfl:    fluticasone  (FLONASE ) 50 MCG/ACT nasal spray, Place 2 sprays into both nostrils 2 (two) times daily., Disp: 16 g, Rfl: 6   Fluticasone  Furoate-Vilanterol (BREO ELLIPTA IN), Inhale 1 puff into the lungs daily., Disp: , Rfl:    gabapentin  (NEURONTIN ) 300 MG capsule, TAKE 1 CAPSULE BY MOUTH EVERYDAY AT BEDTIME, Disp: 90 capsule, Rfl: 4   hydrochlorothiazide  (MICROZIDE ) 12.5 MG capsule, TAKE 1 CAPSULE BY MOUTH EVERY DAY, Disp: 30 capsule, Rfl: 0   levocetirizine (XYZAL  ALLERGY 24HR) 5 MG tablet, Take 1 tablet (5 mg total) by mouth every evening., Disp: 30 tablet, Rfl: 3   levothyroxine  (SYNTHROID ) 50 MCG tablet, TAKE 1 TABLET BY MOUTH EVERY DAY, Disp: 90 tablet, Rfl: 1   meloxicam  (MOBIC ) 15 MG tablet, TAKE 1 TABLET BY MOUTH EVERY DAY, Disp: 90 tablet, Rfl: 0   polyethylene glycol (MIRALAX  / GLYCOLAX ) 17 g packet, Take 17 g by mouth every other day., Disp: , Rfl:    tamoxifen  (NOLVADEX ) 20 MG  tablet, TAKE 1 TABLET BY MOUTH EVERY DAY, Disp: 90 tablet, Rfl: 3   tretinoin  (RETIN-A ) 0.025 % cream, Apply topically at bedtime., Disp: 45 g, Rfl: 6 A complete ROS was performed with pertinent positives/negatives noted in the HPI. The remainder of the ROS are negative.   Physical Exam:  BP 127/77 (BP Location: Left Arm, Patient Position: Sitting, Cuff Size: Normal)   Pulse (!) 53   Temp 98 F (36.7 C) (Oral)   Wt 142 lb (64.4 kg)   SpO2 94%   BMI 24.37 kg/m  General: Well developed, well nourished. No acute distress. Voice hoarse Head/Face: Normocephalic. No sinus tenderness. Facial nerve intact and equal bilaterally. No facial lacerations. Eyes: PERRL, no scleral icterus or conjunctival hemorrhage. EOMI. Ears: No gross deformity. Normal external canal. Tympanic membrane in tact bilaterally Hearing: Normal speech reception.  Nose: No gross deformity or lesions.  No purulent discharge. No turbinate hypertrophy. Mouth/Oropharynx: Lips without any lesions. Dentition good. No mucosal lesions within the oropharynx. No tonsillar enlargement, exudate, or lesions. Pharyngeal walls symmetrical. Uvula midline. Tongue midline without lesions. Larynx: See TFL if applicable Nasopharynx: See TFL if applicable Neck: Trachea midline. No masses. No thyromegaly or nodules palpated. No crepitus. Lymphatic: No lymphadenopathy in the neck. Respiratory: No stridor or distress. Room air. Cardiovascular: Regular rate and rhythm. Extremities: No edema or cyanosis. Warm and well-perfused. Skin: No scars or lesions on face or neck. Neurologic: CN II-XII grossly intact. Moving all extremities without gross abnormality. Other:  Independent Review of Additional Tests or Records: None Procedures: Flexible Fiberoptic Laryngoscopy Procedure Note  Date of procedure 09/22/2024 Pre-Op Diagnosis: hoarseness    Post Op Diagnosis: same Procedure: Flexible Fiberoptic Laryngoscopy CPT 31575 - Mod 25 Surgeon: Adah Malkin, D.O. Anesthesia: 4% Lidocaine  with afrin  Findings:   Procedure Detail: After verbal consent was obtained from the patient, the patient was brought in an upright position, a fiberoptic nasal laryngoscope was then passed into the patient right nasal passage and left nasal passage, the left appeared to be more patent. It was passed along the floor of the nasal cavity to the nasopharynx. Torus tubarius was patent and the Fossa of Rosenmller was identified. The scope was then flexed caudally and advanced slowly through the nasopharynx, passed through the oropharynx, and down into the hypopharynx. The patient's oro- and nasopharynx were unremarkable with no signs of any gross lesions, edema, masses, or bleeding.  The base of tongue was visualized and no mass, ulceration or lesion was appreciated. The epiglottis did not demonstrate any mass, ulceration or lesion. The vallecula was also assessed with no mass, ulceration or lesion. The patient had good glottic closure upon phonation and no signs of aspiration or pooling of secretions. The patient was asked to inspire and expire with the true vocal folds vibrating normally and without evidence of vocal fold dysfunction. Bilateral true vocal cords were noted to have mild atrophy. The true and false vocal cords, interarytenoid, AE folds, and arytenoids did not demonstrate any significant edema or erythema. The patient was then asked to valsalva, and the pyriform sinuses were assessed which were unremarkable. The airway was patent and there was no evidence of compromise. The scope was then slowly withdrawn from the patient. The patient tolerated the procedure well and there were no complications.  Disposition: Stable    Impression & Plans: Assessment and Plan Assessment & Plan Chronic cough  - Refer to pulmonology for pulmonary function test to assess lung function and rule out residual lung issues. - Consider superior laryngeal nerve block if pulmonary  evaluation is unremarkable.  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 Considering vocal cord filler injections - Consider referral to [facility] for specialized evaluation and management.  Chronic postnasal drip with thick mucus Chronic postnasal drip with thick mucus, partially responsive to current antihistamine and nasal spray regimen. - Encourage use of humidifiers to help thin mucus.  Allergic rhinitis Allergic rhinitis managed with cetirizine and Flonase . - Continue cetirizine and Flonase .   Follow-up as needed.  Adah Malkin, DO [facility]

## 2024-09-27 ENCOUNTER — Ambulatory Visit: Attending: Otolaryngology

## 2024-09-27 DIAGNOSIS — R498 Other voice and resonance disorders: Secondary | ICD-10-CM | POA: Diagnosis present

## 2024-09-27 NOTE — Therapy (Unsigned)
 OUTPATIENT SPEECH LANGUAGE PATHOLOGY VOICE TREATMENT/RECERTFICATION   Patient Name: Theresa Bradley MRN: [id number] DOB:1951/02/19, 73 y.o., female Today's Date: 09/28/2024  PCP: Micheal Pin, MD REFERRING PROVIDER: Okey Burns, MD, doc to Dr. Julious Malkin, DO  END OF SESSION:  End of Session - 09/27/24 1542     Visit Number 10    Number of Visits 18    Date for Recertification  11/18/24    SLP Start Time 1533    SLP Stop Time  1615    SLP Time Calculation (min) 42 min    Activity Tolerance Patient tolerated treatment well               Past Medical History:  Diagnosis Date   Anxiety    Breast cancer (HCC) 2009   right   Breast cancer (HCC) 2021   Cancer (HCC)    COUGH, CHRONIC 05/10/2010   Dysrhythmia    occationally skips a beat   Family history of brain cancer    Family history of breast cancer    Family history of lung cancer    Family history of multiple myeloma    Family history of skin cancer    GERD (gastroesophageal reflux disease)    HYPERTENSION 04/16/2009   HYPOTHYROIDISM 04/16/2009   OSTEOARTHRITIS 04/16/2009   Personal history of chemotherapy 2021   Personal history of radiation therapy 2009   SENILE LENTIGO 06/10/2010   Past Surgical History:  Procedure Laterality Date   BREAST EXCISIONAL BIOPSY Right    BREAST EXCISIONAL BIOPSY Left    BREAST LUMPECTOMY Right 2009   radiation only   BREAST LUMPECTOMY WITH RADIOACTIVE SEED AND SENTINEL LYMPH NODE BIOPSY Right 01/31/2020   Procedure: RIGHT BREAST LUMPECTOMY WITH RADIOACTIVE SEED AND SENTINEL LYMPH NODE BIOPSY;  Surgeon: Vanderbilt Ned, MD;  Location: [facility];  Service: General;  Laterality: Right;  PEC BLOCK   BREAST SURGERY  2009   X 2, cancer stage 0, lumpectomy   CESAREAN SECTION     1979, 1982   CHOLECYSTECTOMY     COLONOSCOPY     DILATATION & CURETTAGE/HYSTEROSCOPY WITH MYOSURE  11/2015   HERNIA REPAIR  2009   JOINT REPLACEMENT  2008   hip    MASTECTOMY Right 2021   OPEN SURGICAL REPAIR OF GLUTEAL TENDON Left 07/18/2019   Procedure: Left hip bearing surface revision with gluteal tendon repair;  Surgeon: Melodi Lerner, MD;  Location: [facility];  Service: Orthopedics;  Laterality: Left;  2 hrs   PORTACATH PLACEMENT N/A 03/15/2020   Procedure: INSERTION PORT-A-CATH WITH ULTRASOUND GUIDANCE;  Surgeon: Vanderbilt Ned, MD;  Location: [facility];  Service: General;  Laterality: N/A;   SIMPLE MASTECTOMY WITH AXILLARY SENTINEL NODE BIOPSY Right 03/15/2020   Procedure: RIGHT SIMPLE MASTECTOMY;  Surgeon: Vanderbilt Ned, MD;  Location: [facility];  Service: General;  Laterality: Right;   Patient Active Problem List   Diagnosis Date Noted   Port-A-Cath in place 05/15/2020   Genetic testing 02/03/2020   Family history of breast cancer    Family history of skin cancer    Family history of brain cancer    Family history of lung cancer    Family history of multiple myeloma    Malignant neoplasm of upper-outer quadrant of right breast in female, estrogen receptor positive (HCC) 01/04/2020   Melanoma of skin (HCC) 08/15/2019   Failed total hip arthroplasty, 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  Objective: Pt had been making steady progress until therapy success complicated by pt's latest illness, now reportedly increasing amount of secretions, causing incr'd frequency of habitual throat clearing. Pt states throat clearing is often productive. SLP reminded pt VCD/chronic cough  can also cause feeling of incr'd need for throat clearing - SLP encouraged pt to think about nerve block.   Goal Update: See below  Plan: See pt once/week for 8 more weeks.  Reason Skilled Services are Required: Pt has not reached max potential.   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  Rationale for Evaluation and Treatment: Rehabilitation  SUBJECTIVE:   SUBJECTIVE STATEMENT: It is productive a lot of the time. (Pt, re throat clearing)   Pt accompanied by: self  PERTINENT HISTORY:  From Soldatova's note 06/17/24: Dominica [place] is a 73 year old female with chronic cough who presents with persistent cough and voice changes. She was referred by Dr. Fleeta Smock Allergies for evaluation of her chronic cough for several years and voice changes/dysphonia for several months   She has experienced a chronic cough for many years, which worsened after an illness in December 2024. She also experienced persistent hoarseness at the same time, and has had it since. The cough is associated with phlegm production, sometimes green in color, and occasional throat discomfort. A chest x-ray performed in May was normal. She has a history of asthma and has been prescribed an emergency inhaler, which she has not needed to use in the past month and a half. During her worst symptoms, she coughed up significant amounts of phlegm, which has since decreased but still occurs. She occasionally experiences throat pain, described as being lower in the throat, and has had episodes of difficulty swallowing, though these are not frequent.   Her voice has changed since December. She has a history of allergies and has been receiving allergy shots for years, though she  stopped in May due to illness and antibiotic use. She has tried various antihistamines, including Zyrtec and Claritin, without significant relief. She performs daily nasal saline rinses and has a bottle of Flonase .    In the past, she consulted a pulmonologist who diagnosed her with a cyclical cough and provided treatment, but the cough returned. This was approximately seven or eight years ago.  PAIN:  Are you having pain? No  FALLS: Has patient fallen in last 6 months? No  PATIENT GOALS: Address voice concerns  OBJECTIVE:  Note: Objective measures were completed at Evaluation unless otherwise noted.  DIAGNOSTIC FINDINGS:  Dharap 09/22/24: The base of tongue was visualized and no mass, ulceration or lesion was appreciated. The epiglottis did not demonstrate any mass, ulceration or lesion. The vallecula was also assessed with no mass, ulceration or lesion. The patient had good glottic closure upon phonation and no signs of aspiration or pooling of secretions. The patient was asked to inspire and expire with the true vocal folds vibrating normally and  without evidence of vocal fold dysfunction. Bilateral true vocal cords were noted to have mild atrophy. The true and false vocal cords, interarytenoid, AE folds, and arytenoids did not demonstrate any significant edema or erythema. The patient was then asked to valsalva, and the pyriform sinuses were assessed which were unremarkable. The airway was patent and there was no evidence of compromise. The scope was then slowly withdrawn from the patient. The patient tolerated the procedure well and there were no complications.  Disposition: Stable   Impression & Plans: Assessment and Plan Assessment & Plan Chronic cough  - Refer to pulmonology for pulmonary function test to assess lung function and rule out residual lung issues. - Consider superior laryngeal nerve block if pulmonary evaluation is unremarkable.   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 Considering vocal cord filler injections - Consider referral to [facility] for specialized evaluation and management.   Chronic postnasal drip with thick mucus Chronic postnasal drip with thick mucus, partially responsive to current antihistamine and nasal spray regimen. - Encourage use of humidifiers to help thin mucus.  S[place] 06/17/24: Procedure: Preoperative diagnosis: hoarseness   Postoperative diagnosis:   same VF atrophy and glottic insufficiency   Procedure: Flexible fiberoptic laryngoscopy with stroboscopy (68420)    Surgeon: Elena Larry, MD   Anesthesia: Topical lidocaine  and Afrin   Complications: None   Condition is stable throughout exam   Indications and consent:   The patient presents to the clinic with hoarseness. All the risks, benefits, and potential complications were reviewed with the patient preoperatively and informed verbal consent was obtained.   Procedure: The patient was seated upright in the exam chair.   Topical lidocaine  and Afrin were applied to the nasal cavity. After adequate anesthesia had occurred, the flexible telescope with strobe capabilities was passed into the nasal cavity. The nasopharynx was patent without mass or lesion. The scope was passed behind the soft palate and directed toward the base of tongue. The base of tongue was visualized and was symmetric with no apparent masses or abnormal appearing tissue. There were no signs of a mass or pooling of secretions in the piriform sinuses. The supraglottic structures were normal.   The true vocal cords are mobile. The medial edges were straight. Closure was complete but with severe supraglottic compression. Periodicity present. The mucosal wave and amplitude were slightly decreased 2/2 straight and supraglottic  compression. There is moder ate interarytenoid pachydermia and post cricoid edema. The mucosa appears without lesions.   The laryngoscope was then slowly withdrawn and the patient tolerated the procedure well. There were no complications or blood loss.  Assessment & Plan Chronic cough We discussed common causes of chronic cough and neurogenic cough. Previous treatments for post-nasal drainage and reflux were ineffective. Was seen by Pulm before and diagnosed with cyclical cough. Normal chest x-ray 03/2024. - Review superior laryngeal nerve block information. - Xyzal  5 mg daily and Flonase , and nasal rinses daily - Consider superior laryngeal nerve block if symptoms persist.   Environmental allergies and Post-nasal drainage  Postnasal drip contributing to chronic cough. Previous treatments ineffective. - Continue nasal saline rinses. - Start Flonase  nasal spray BID - Prescribed Xyzal . - continue allergy shots    Dysphonia Hoarseness x 7 months Strobe today with supraglottic compression, VF atrophy and glottic insufficiency. We discussed a trial of speech therapy - Refer to speech therapy.  RTC 3 months   PATIENT REPORTED OUTCOME MEASURES (PROM): [place] Cough Index: Scored herself 63/133, with lower scores indicating worse QoL and negative affects from pt's cough.                                                                                                                            TREATMENT DATE:  AB= Abdominal breathing; SOVTE=Semi-occluded vocal tract exercises, VFE=Vocal function exercises  09/27/24: Pt was scheduled for ST in previous weeks but was ill, than last week got the time wrong in her planner. Pt enters with vocal quality notably worse than previous session. Extended discussion with pt about how her atrophy and supraglottic compression, and chronic cough  and VCD sx are separate vocal issues. Pt feels she was having more improvement with vocal quality prior to latest  URI/cold sx. Today SLP heard pt quietly throat clear 16 times and regular throat clear 3x during session. SLP used SOVTE task of straw phonation with pt to reduce laryngeal tension and encourage coordinated production of the respiratory and phonatory systems. Today, pt independent with production and after 20-30 seconds of using the straw, her voice improved to 7-8/10 quality (10=WNL) for approx 20 seconds in response to SLP questions. SLP told pt to cont to work with straw phonation at home with use of AB.   08/11/24: Pt stated she used her straw phonation prior to talking with her brother, and her mother yesterday and heard a WNL voice for 3-5 minutes afterwards. Today she used straw phonation and did demonstrate WNL voice afterwards for approx 2-3 minutes. When pt noticed harsh/hoarse voice she used straw again and this cycle continued. As the session progressed however, the WNL voicing times decr'd in length, maybe due to fatigue. SLP again explained pt also has vocal fold atrophy which might lead to fatigue quicker than if she did not have atrophy. Pt will use straw throughout the day to foster WNL voicing.  08/08/24: Cough spell lasting 3-4 minutes as [name] entered lobby. Entered clearing throat - total today 5- were very mildly harsh - none were harsh; Pt tapped vocal folds as a throat clear alternative 15 times and drank water. Pt with little talking today - total 15 minutes. SLP assisted pt with resonant voicing and SOVTE with straw phonation, whooo, and whooooos ______, vocal glides, and AB. Pt has been practicing these at home; Each time pt was able to change her voicing to WNL quality. She also accomplished this today during session x3 after 2-3 minutes of vocal glides with straw and without straw. SLP reiterated to pt why practice is important with this on a daily basis. Pt to cont this work at home.  08/04/24: Works on AB when trying to go to sleep at night. Today SLP worked with pt with  flow phonation and SOVTE tasks; The most success with decr'ing voice quality common with supraglottic compression was straw phonation from high pitch to low pitch as well as  high to low pitch whooo's _______? Sheron was also successful to bring pt's speech to more WNL sounding. Homework to use straw high to low pitch, and yawn-sigh for more WNL voicing. SLP used voice rest to mitigate effect of pt's chronic cough which is exacerbated by voice use.   07/28/24: After flow phonation practice last night pt stated she heard her WNL voice for 1 or 2 sentences afterwards. SLP ascertained pt also has sx of hypersensitivity to scent - pt has coughing with strong scents. Mild cough during Pt coughed last night during a meeting where she had to leave the meeting room. SLP had pt practice all chronic cough breathing strategies and stressed AB and sip/swallow to use as well. SLP guided pt through flow phonation practice and pt began coughing within 2 minutes of practice. Pt reported she coughed within 5 minutes of practice with flow phonation since last session but has cont'd to practice with this despite that. Today WNL voice was heard after 4 reps of moo using tissue, preceeded by 8 reps of ooooo with tissue. Then pt began coughing.   07/26/24: SLP provided pt another copy of cough index as she misplaced the first. Score as above. AB at rest for 5 minutes - 90% success. Meds for congestion helping with congested cough - no longer exists. Voice does not sound as strong - pt states her husband is asking her to repeat more in last 1-2 weeks.  SLP used flow phonation techniques and SOVTE to normalize pt's vocal quality. Blowing with tissue used as visual support - pt with 100% success, with vocalization and exhalation initial 0% success but pt became more successful as SLP continued cueing and demonstrating. SLP added /m/, /s/, and sh in front of oo for feeling of flow phonation. Pt's first sentence after practicing  flow phonation was WNL voicing, but then reverted back to that indicative of compression after one sentence. Pt to practice flow phonation tasks covered in ST today for 15 minutes multiple times/day. AFTER 25 MINUTES OF FLOW PHONATION PRACTICE PT EXPERIENCED COUGH SPELL for approx 4 minutes. She used box breathing and pursed lip inhale/exhale for success in stopping sx.  07/20/24: Pt to bring cough index next session. WNL voice quality heard approx 60% today. Pt has cut all caff coffee except one cup with a little decaf in AM. Finds it hard to stop coughing as cough sneaks up on her -tried to use sniff/blow technique. Flonase  has dried up some of the mucous. SLP reviewed procedure for all chronic cough rescue breathing strategies and pt completing each incorrectly. SLP corrected pt's productions and pt was independent.  Pt admitted she has to think about which strategy to use when she starts coughing instead of spontaneously starting one. SLP told pt to cont to practice strategies 20x each/daily to make this easier when she has an episode.   07/13/24: SLP worked with pt today with SOVTE and flow phonation exercises to reduce tension/compression when vocalizing. SLP used /m/, /n/, /u/, and /o/ words and pt initially with 30% improving to 85% success with WNL voicing with pitch variations. Still rough sounding in lower pitches - possibly due to vocal fold atrophy? Pt will practice phrases  at home as in pt instructions.   06/30/24: SLP educated pt on throat clear alternatives; pt began using water sips after two additional throat clears. SLP guided pt through strategies for chronic cough. SLP then started AB practice, which triggered chronic cough. SLP then guided pt through strategies  for chronic cough in the moment, which quelled cough in 90 seconds. Homework to work on AB and to practice chronic cough strategies when not in coughing spell.   06/27/24: Not charged due to not authorized by insurance. SLP educated  pt about AB and watched pt as she attempted AB. At rest pt began at 50% success but with occasional min A she incr'd success to 80%. She was told to practice this at home 10-15 minutes BID.  PATIENT EDUCATION: Education details: see treatment date this date, above Person educated: Patient Education method: Programmer, Multimedia, Demonstration, and Verbal cues Education comprehension: verbalized understanding, returned demonstration, verbal cues required, and needs further education  HOME EXERCISE PROGRAM: AB, SOVTE  GOALS: Goals reviewed with patient? Yes  SHORT TERM GOALS: Target date: 07/29/24  Pt will complete AB at rest with 85% success for 5 minutes, in 2 sessions Baseline: 07/26/24 Goal status: partially met  2.  Pt will complete AB in at least 17/20 sentence responses, in two sessions Baseline:  Goal status: not met - focus on compression  3.  To decr laryngeal compression, pt will complete SOVTE tasks in session with 90% success in 2 sessions Baseline: 07/28/24 Goal status: met   LONG TERM GOALS: Target date:  11/18/24  Pt will improve PROM from initial administration Baseline:  Goal status: taken last 2 sessions  2.  Pt will reduce throat clears to <3 in 3 sessions, using compensations PRN Baseline:  Goal status: not met and cont  3.  Pt will exhibit WNL voice quality for 1 minute following SOVTE or other voice exercises in 2 sessions Baseline:  Goal status: not met and cont  4.  Pt will exhibit WNL voice quality for 5 minutes following SOVTE or other voice exercises in 3 sessions Baseline:  Goal status: not met and cont  5.  Pt will report being able to talk on the phone for 30 minutes without coughing episodes, between 2 sessions, using compensations PRN  Baseline:  Goal status: not met and cont   ASSESSMENT:  CLINICAL IMPRESSION: RECERT TODAY. Pt voice quality worse in today's session compared to previous session - she has been away from ST due to illness and  missing an appointment last week. Patient is a 73 y.o. F who was seen today for treatment of voice. See treatment date above for today's date for further details on today's session. Tyson has declined in her vocal quality primarily due to illness since last session. Today she used SOVTE technique to improve vocal quality to approx 80% of WNL for approx 20 seconds and then reverted to more hoarse and rough voice. She will cont to need skilled ST both for chronic cough and for improving voice quality due to vocal fold atrophy and laryngeal compression.  OBJECTIVE IMPAIRMENTS: include voice disorder. These impairments are limiting patient from managing appointments, household responsibilities, ADLs/IADLs, and effectively communicating at home and in community. Factors affecting potential to achieve goals and functional outcome are severity of impairments.. Patient will benefit from skilled SLP services to address above impairments and improve overall function.  REHAB POTENTIAL: Good  PLAN:  SLP FREQUENCY: 1-2x/week  SLP DURATION: 8 weeks  PLANNED INTERVENTIONS: Internal/external aids, Functional tasks, SLP instruction and feedback, Compensatory strategies, Patient/family education, [phone] Treatment of speech (30 or 45 min) , and voice exercises    Shiann Kam, CCC-SLP 09/28/2024, 10:44 AM

## 2024-10-03 ENCOUNTER — Other Ambulatory Visit (INDEPENDENT_AMBULATORY_CARE_PROVIDER_SITE_OTHER): Payer: Self-pay | Admitting: Otolaryngology

## 2024-10-04 ENCOUNTER — Telehealth (INDEPENDENT_AMBULATORY_CARE_PROVIDER_SITE_OTHER): Payer: Self-pay

## 2024-10-04 ENCOUNTER — Other Ambulatory Visit: Payer: Self-pay | Admitting: Family Medicine

## 2024-10-04 ENCOUNTER — Ambulatory Visit

## 2024-10-04 ENCOUNTER — Encounter: Payer: Self-pay | Admitting: Family Medicine

## 2024-10-04 DIAGNOSIS — R498 Other voice and resonance disorders: Secondary | ICD-10-CM | POA: Diagnosis not present

## 2024-10-04 NOTE — Therapy (Signed)
 OUTPATIENT SPEECH LANGUAGE PATHOLOGY VOICE TREATMENT   Patient Name: Theresa Bradley MRN: [id number] DOB:27-Aug-1951, 73 y.o., female Today's Date: 10/04/2024  PCP: Micheal Pin, MD REFERRING PROVIDER: Okey Burns, MD, doc to Dr. Julious Malkin, DO  END OF SESSION:  End of Session - 10/04/24 1749     Visit Number 11    Number of Visits 18    Date for Recertification  11/18/24    SLP Start Time 1450    SLP Stop Time  1530    SLP Time Calculation (min) 40 min    Activity Tolerance Patient tolerated treatment well                Past Medical History:  Diagnosis Date   Anxiety    Breast cancer (HCC) 2009   right   Breast cancer (HCC) 2021   Cancer (HCC)    COUGH, CHRONIC 05/10/2010   Dysrhythmia    occationally skips a beat   Family history of brain cancer    Family history of breast cancer    Family history of lung cancer    Family history of multiple myeloma    Family history of skin cancer    GERD (gastroesophageal reflux disease)    HYPERTENSION 04/16/2009   HYPOTHYROIDISM 04/16/2009   OSTEOARTHRITIS 04/16/2009   Personal history of chemotherapy 2021   Personal history of radiation therapy 2009   SENILE LENTIGO 06/10/2010   Past Surgical History:  Procedure Laterality Date   BREAST EXCISIONAL BIOPSY Right    BREAST EXCISIONAL BIOPSY Left    BREAST LUMPECTOMY Right 2009   radiation only   BREAST LUMPECTOMY WITH RADIOACTIVE SEED AND SENTINEL LYMPH NODE BIOPSY Right 01/31/2020   Procedure: RIGHT BREAST LUMPECTOMY WITH RADIOACTIVE SEED AND SENTINEL LYMPH NODE BIOPSY;  Surgeon: Vanderbilt Ned, MD;  Location: [facility];  Service: General;  Laterality: Right;  PEC BLOCK   BREAST SURGERY  2009   X 2, cancer stage 0, lumpectomy   CESAREAN SECTION     1979, 1982   CHOLECYSTECTOMY     COLONOSCOPY     DILATATION & CURETTAGE/HYSTEROSCOPY WITH MYOSURE  11/2015   HERNIA REPAIR  2009   JOINT REPLACEMENT  2008   hip   MASTECTOMY Right  2021   OPEN SURGICAL REPAIR OF GLUTEAL TENDON Left 07/18/2019   Procedure: Left hip bearing surface revision with gluteal tendon repair;  Surgeon: Melodi Lerner, MD;  Location: [facility];  Service: Orthopedics;  Laterality: Left;  2 hrs   PORTACATH PLACEMENT N/A 03/15/2020   Procedure: INSERTION PORT-A-CATH WITH ULTRASOUND GUIDANCE;  Surgeon: Vanderbilt Ned, MD;  Location: [facility];  Service: General;  Laterality: N/A;   SIMPLE MASTECTOMY WITH AXILLARY SENTINEL NODE BIOPSY Right 03/15/2020   Procedure: RIGHT SIMPLE MASTECTOMY;  Surgeon: Vanderbilt Ned, MD;  Location: [facility];  Service: General;  Laterality: Right;   Patient Active Problem List   Diagnosis Date Noted   Port-A-Cath in place 05/15/2020   Genetic testing 02/03/2020   Family history of breast cancer    Family history of skin cancer    Family history of brain cancer    Family history of lung cancer    Family history of multiple myeloma    Malignant neoplasm of upper-outer quadrant of right breast in female, estrogen receptor positive (HCC) 01/04/2020   Melanoma of skin (HCC) 08/15/2019   Failed total hip arthroplasty, 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  Rationale for Evaluation and Treatment: Rehabilitation  SUBJECTIVE:   SUBJECTIVE STATEMENT: Pt arrives with WNL voice quality today.     Pt accompanied by: self  PERTINENT HISTORY:  From [place]'s note 06/17/24: Theresa Bradley is a 73 year old  female with chronic cough who presents with persistent cough and voice changes. She was referred by Dr. Fleeta Smock Allergies for evaluation of her chronic cough for several years and voice changes/dysphonia for several months   She has experienced a chronic cough for many years, which worsened after an illness in December 2024. She also experienced persistent hoarseness at the same time, and has had it since. The cough is associated with phlegm production, sometimes green in color, and occasional throat discomfort. A chest x-ray performed in May was normal. She has a history of asthma and has been prescribed an emergency inhaler, which she has not needed to use in the past month and a half. During her worst symptoms, she coughed up significant amounts of phlegm, which has since decreased but still occurs. She occasionally experiences throat pain, described as being lower in the throat, and has had episodes of difficulty swallowing, though these are not frequent.   Her voice has changed since December. She has a history of allergies and has been receiving allergy shots for years, though she stopped in May due to illness and antibiotic use. She has tried various antihistamines, including Zyrtec and Claritin, without significant relief. She performs daily nasal saline rinses and has a bottle of Flonase .    In the past, she consulted a pulmonologist who diagnosed her with a cyclical cough and provided treatment, but the cough returned. This was approximately seven or eight years ago.  PAIN:  Are you having pain? No  FALLS: Has patient fallen in last 6 months? No  PATIENT GOALS: Address voice concerns  OBJECTIVE:  Note: Objective measures were completed at Evaluation unless otherwise noted.  DIAGNOSTIC FINDINGS:  Dharap 09/22/24: The base of tongue was visualized and no mass, ulceration or lesion was appreciated. The epiglottis did not demonstrate any mass, ulceration or lesion. The vallecula was also  assessed with no mass, ulceration or lesion. The patient had good glottic closure upon phonation and no signs of aspiration or pooling of secretions. The patient was asked to inspire and expire with the true vocal folds vibrating normally and without evidence of vocal fold dysfunction. Bilateral true vocal cords were noted to have mild atrophy. The true and false vocal cords, interarytenoid, AE folds, and arytenoids did not demonstrate any significant edema or erythema. The patient was then asked to valsalva, and the pyriform sinuses were assessed which were unremarkable. The airway was patent and there was no evidence of compromise. The scope was then slowly withdrawn from the patient. The patient tolerated the procedure well and there were no complications.  Disposition: Stable   Impression & Plans: Assessment and Plan Assessment & Plan Chronic cough  - Refer to pulmonology for pulmonary function test to assess lung function and rule out residual lung issues. - Consider superior laryngeal nerve block if pulmonary evaluation is unremarkable.  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 Considering vocal cord filler injections - Consider referral to [facility] for specialized evaluation and management.   Chronic postnasal drip with thick mucus Chronic postnasal drip with thick mucus, partially responsive to current antihistamine and nasal spray regimen. - Encourage use of humidifiers to help thin mucus.  [place] 06/17/24: Procedure: Preoperative diagnosis: hoarseness   Postoperative diagnosis:   same VF atrophy and glottic insufficiency   Procedure: Flexible fiberoptic laryngoscopy with stroboscopy (68420)    Surgeon: Elena Larry, MD   Anesthesia: Topical lidocaine  and Afrin   Complications: None    Condition is stable throughout exam   Indications and consent:   The patient presents to the clinic with hoarseness. All the risks, benefits, and potential complications were reviewed with the patient preoperatively and informed verbal consent was obtained.   Procedure: The patient was seated upright in the exam chair.   Topical lidocaine  and Afrin were applied to the nasal cavity. After adequate anesthesia had occurred, the flexible telescope with strobe capabilities was passed into the nasal cavity. The nasopharynx was patent without mass or lesion. The scope was passed behind the soft palate and directed toward the base of tongue. The base of tongue was visualized and was symmetric with no apparent masses or abnormal appearing tissue. There were no signs of a mass or pooling of secretions in the piriform sinuses. The supraglottic structures were normal.   The true vocal cords are mobile. The medial edges were straight. Closure was complete but with severe supraglottic compression. Periodicity present. The mucosal wave and amplitude were slightly decreased 2/2 straight and supraglottic compression. There is moder ate interarytenoid pachydermia and post cricoid edema. The mucosa appears without lesions.   The laryngoscope was then slowly withdrawn and the patient tolerated the procedure well. There were no complications or blood loss.  Assessment & Plan Chronic cough We discussed common causes of chronic cough and neurogenic cough. Previous treatments for post-nasal drainage and reflux were ineffective. Was seen by Pulm before and diagnosed with cyclical cough. Normal chest x-ray 03/2024. - Review superior laryngeal nerve block information. - Xyzal  5 mg daily and Flonase , and nasal rinses daily - Consider superior laryngeal nerve block if symptoms persist.   Environmental allergies and Post-nasal drainage  Postnasal drip contributing to chronic cough. Previous treatments ineffective. - Continue  nasal saline rinses. - Start Flonase  nasal spray BID - Prescribed Xyzal . - continue allergy shots    Dysphonia Hoarseness x 7 months Strobe today with supraglottic compression, VF atrophy and glottic insufficiency. We discussed a trial of speech therapy - Refer to speech therapy.   RTC 3 months   PATIENT REPORTED OUTCOME MEASURES (PROM): [place] Cough Index: Scored herself 63/133, with lower scores indicating worse QoL and negative affects from pt's cough.  TREATMENT DATE:  AB= Abdominal breathing; SOVTE=Semi-occluded vocal tract exercises, VFE=Vocal function exercises  10/04/24: Reports that she feels throat clear is still productive. Did inquire to have appointment for nerve block injection following her pulmonologist appointment. Genetle throat clears x19 today. Coughing spell near end of session when SLP attempting to perform PhoRTE with pt at WNL volume level. Pt stated this cough feeling came from her upper chest (touched sternum), and not her throat. She has had a feeling like she needed to cough come from her throat but that has not occurred for a while. Pt noted to breath with AB. Will cont to complete straw exercises. SLP thinks pt is almost where she was with WNL voice and relaxed nature of voice prior to her latest illness. SLP to consider PhoRTE next session.   09/27/24: Pt was scheduled for ST in previous weeks but was ill, than last week got the time wrong in her planner. Pt enters with vocal quality notably worse than previous session. Extended discussion with pt about how her atrophy and supraglottic compression, and chronic cough  and VCD sx are separate vocal issues. Pt feels she was having more improvement with vocal quality prior to latest URI/cold sx. Today SLP heard pt quietly throat clear 16 times and regular throat clear 3x during session. SLP used  SOVTE task of straw phonation with pt to reduce laryngeal tension and encourage coordinated production of the respiratory and phonatory systems. Today, pt independent with production and after 20-30 seconds of using the straw, her voice improved to 7-8/10 quality (10=WNL) for approx 20 seconds in response to SLP questions. SLP told pt to cont to work with straw phonation at home with use of AB.   08/11/24: Pt stated she used her straw phonation prior to talking with her brother, and her mother yesterday and heard a WNL voice for 3-5 minutes afterwards. Today she used straw phonation and did demonstrate WNL voice afterwards for approx 2-3 minutes. When pt noticed harsh/hoarse voice she used straw again and this cycle continued. As the session progressed however, the WNL voicing times decr'd in length, maybe due to fatigue. SLP again explained pt also has vocal fold atrophy which might lead to fatigue quicker than if she did not have atrophy. Pt will use straw throughout the day to foster WNL voicing.  08/08/24: Cough spell lasting 3-4 minutes as [name] entered lobby. Entered clearing throat - total today 5- were very mildly harsh - none were harsh; Pt tapped vocal folds as a throat clear alternative 15 times and drank water. Pt with little talking today - total 15 minutes. SLP assisted pt with resonant voicing and SOVTE with straw phonation, whooo, and whooooos ______, vocal glides, and AB. Pt has been practicing these at home; Each time pt was able to change her voicing to WNL quality. She also accomplished this today during session x3 after 2-3 minutes of vocal glides with straw and without straw. SLP reiterated to pt why practice is important with this on a daily basis. Pt to cont this work at home.  08/04/24: Works on AB when trying to go to sleep at night. Today SLP worked with pt with flow phonation and SOVTE tasks; The most success with decr'ing voice quality common with supraglottic compression was  straw phonation from high pitch to low pitch as well as high to low pitch whooo's _______? Sheron was also successful to bring pt's speech to more WNL sounding. Homework to use straw high to low pitch,  and yawn-sigh for more WNL voicing. SLP used voice rest to mitigate effect of pt's chronic cough which is exacerbated by voice use.   07/28/24: After flow phonation practice last night pt stated she heard her WNL voice for 1 or 2 sentences afterwards. SLP ascertained pt also has sx of hypersensitivity to scent - pt has coughing with strong scents. Mild cough during Pt coughed last night during a meeting where she had to leave the meeting room. SLP had pt practice all chronic cough breathing strategies and stressed AB and sip/swallow to use as well. SLP guided pt through flow phonation practice and pt began coughing within 2 minutes of practice. Pt reported she coughed within 5 minutes of practice with flow phonation since last session but has cont'd to practice with this despite that. Today WNL voice was heard after 4 reps of moo using tissue, preceeded by 8 reps of ooooo with tissue. Then pt began coughing.   07/26/24: SLP provided pt another copy of cough index as she misplaced the first. Score as above. AB at rest for 5 minutes - 90% success. Meds for congestion helping with congested cough - no longer exists. Voice does not sound as strong - pt states her husband is asking her to repeat more in last 1-2 weeks.  SLP used flow phonation techniques and SOVTE to normalize pt's vocal quality. Blowing with tissue used as visual support - pt with 100% success, with vocalization and exhalation initial 0% success but pt became more successful as SLP continued cueing and demonstrating. SLP added /m/, /s/, and sh in front of oo for feeling of flow phonation. Pt's first sentence after practicing flow phonation was WNL voicing, but then reverted back to that indicative of compression after one sentence. Pt to  practice flow phonation tasks covered in ST today for 15 minutes multiple times/day. AFTER 25 MINUTES OF FLOW PHONATION PRACTICE PT EXPERIENCED COUGH SPELL for approx 4 minutes. She used box breathing and pursed lip inhale/exhale for success in stopping sx.  07/20/24: Pt to bring cough index next session. WNL voice quality heard approx 60% today. Pt has cut all caff coffee except one cup with a little decaf in AM. Finds it hard to stop coughing as cough sneaks up on her -tried to use sniff/blow technique. Flonase  has dried up some of the mucous. SLP reviewed procedure for all chronic cough rescue breathing strategies and pt completing each incorrectly. SLP corrected pt's productions and pt was independent.  Pt admitted she has to think about which strategy to use when she starts coughing instead of spontaneously starting one. SLP told pt to cont to practice strategies 20x each/daily to make this easier when she has an episode.   07/13/24: SLP worked with pt today with SOVTE and flow phonation exercises to reduce tension/compression when vocalizing. SLP used /m/, /n/, /u/, and /o/ words and pt initially with 30% improving to 85% success with WNL voicing with pitch variations. Still rough sounding in lower pitches - possibly due to vocal fold atrophy? Pt will practice phrases  at home as in pt instructions.   06/30/24: SLP educated pt on throat clear alternatives; pt began using water sips after two additional throat clears. SLP guided pt through strategies for chronic cough. SLP then started AB practice, which triggered chronic cough. SLP then guided pt through strategies for chronic cough in the moment, which quelled cough in 90 seconds. Homework to work on AB and to practice chronic cough strategies when not in  coughing spell.   06/27/24: Not charged due to not authorized by insurance. SLP educated pt about AB and watched pt as she attempted AB. At rest pt began at 50% success but with occasional min A she  incr'd success to 80%. She was told to practice this at home 10-15 minutes BID.  PATIENT EDUCATION: Education details: see treatment date this date, above Person educated: Patient Education method: Programmer, Multimedia, Demonstration, and Verbal cues Education comprehension: verbalized understanding, returned demonstration, verbal cues required, and needs further education  HOME EXERCISE PROGRAM: AB, SOVTE  GOALS: Goals reviewed with patient? Yes  SHORT TERM GOALS: Target date: 07/29/24  Pt will complete AB at rest with 85% success for 5 minutes, in 2 sessions Baseline: 07/26/24 Goal status: partially met  2.  Pt will complete AB in at least 17/20 sentence responses, in two sessions Baseline:  Goal status: not met - focus on compression  3.  To decr laryngeal compression, pt will complete SOVTE tasks in session with 90% success in 2 sessions Baseline: 07/28/24 Goal status: met   LONG TERM GOALS: Target date:  11/18/24  Pt will improve PROM from initial administration Baseline:  Goal status: taken last 2 sessions  2.  Pt will reduce throat clears to <3 in 3 sessions, using compensations PRN Baseline:  Goal status: not met and cont  3.  Pt will exhibit WNL voice quality for 1 minute following SOVTE or other voice exercises in 2 sessions Baseline:  Goal status: not met and cont  4.  Pt will exhibit WNL voice quality for 5 minutes following SOVTE or other voice exercises in 3 sessions Baseline:  Goal status: not met and cont  5.  Pt will report being able to talk on the phone for 30 minutes without coughing episodes, between 2 sessions, using compensations PRN  Baseline:  Goal status: not met and cont   ASSESSMENT:  CLINICAL IMPRESSION: Pt voice quality better in today's session compared to previous session. Patient is a 73 y.o. F who was seen today for treatment of voice. See treatment date above for today's date for further details on today's session. She will cont to  perform SOVTE to foster WNL phonation and airflow for conversational speech. She will cont to need skilled ST both for chronic cough and for improving voice quality due to vocal fold atrophy and laryngeal compression.  OBJECTIVE IMPAIRMENTS: include voice disorder. These impairments are limiting patient from managing appointments, household responsibilities, ADLs/IADLs, and effectively communicating at home and in community. Factors affecting potential to achieve goals and functional outcome are severity of impairments.. Patient will benefit from skilled SLP services to address above impairments and improve overall function.  REHAB POTENTIAL: Good  PLAN:  SLP FREQUENCY: 1-2x/week  SLP DURATION: 8 weeks  PLANNED INTERVENTIONS: Internal/external aids, Functional tasks, SLP instruction and feedback, Compensatory strategies, Patient/family education, [phone] Treatment of speech (30 or 45 min) , and voice exercises    Kattaleya Alia, CCC-SLP 10/04/2024, 5:49 PM

## 2024-10-04 NOTE — Telephone Encounter (Signed)
 Pt called about nerve block injection that is interested in moving forward where would you like pt to be sched

## 2024-10-05 ENCOUNTER — Other Ambulatory Visit: Payer: Self-pay | Admitting: Hematology and Oncology

## 2024-10-05 ENCOUNTER — Ambulatory Visit: Admitting: Family Medicine

## 2024-10-05 ENCOUNTER — Encounter: Payer: Self-pay | Admitting: Family Medicine

## 2024-10-05 VITALS — BP 136/76 | HR 51 | Temp 97.9°F | Wt 146.1 lb

## 2024-10-05 DIAGNOSIS — E039 Hypothyroidism, unspecified: Secondary | ICD-10-CM | POA: Diagnosis not present

## 2024-10-05 DIAGNOSIS — M8580 Other specified disorders of bone density and structure, unspecified site: Secondary | ICD-10-CM | POA: Insufficient documentation

## 2024-10-05 DIAGNOSIS — I10 Essential (primary) hypertension: Secondary | ICD-10-CM

## 2024-10-05 DIAGNOSIS — Z170 Estrogen receptor positive status [ER+]: Secondary | ICD-10-CM

## 2024-10-05 DIAGNOSIS — R053 Chronic cough: Secondary | ICD-10-CM | POA: Diagnosis not present

## 2024-10-05 LAB — BASIC METABOLIC PANEL WITH GFR
BUN: 13 mg/dL (ref 6–23)
CO2: 32 meq/L (ref 19–32)
Calcium: 9.4 mg/dL (ref 8.4–10.5)
Chloride: 98 meq/L (ref 96–112)
Creatinine, Ser: 1.04 mg/dL (ref 0.40–1.20)
GFR: 52.67 mL/min — ABNORMAL LOW (ref >60.00)
Glucose, Bld: 89 mg/dL (ref 70–99)
Potassium: 3.3 meq/L — ABNORMAL LOW (ref 3.5–5.1)
Sodium: 138 meq/L (ref 135–145)

## 2024-10-05 LAB — TSH: TSH: 2.18 u[IU]/mL (ref 0.35–5.50)

## 2024-10-05 MED ORDER — HYDROCHLOROTHIAZIDE 12.5 MG PO CAPS: 12.5000 mg | ORAL_CAPSULE | Freq: Every day | ORAL | 3 refills | Status: AC

## 2024-10-05 MED ORDER — AMLODIPINE BESYLATE 5 MG PO TABS: 5.0000 mg | ORAL_TABLET | Freq: Every day | ORAL | 3 refills | Status: AC

## 2024-10-05 MED ORDER — LEVOTHYROXINE SODIUM 50 MCG PO TABS: 50.0000 ug | ORAL_TABLET | Freq: Every day | ORAL | 3 refills | Status: AC

## 2024-10-05 NOTE — Progress Notes (Signed)
 Established Patient Office Visit  Subjective   Patient ID: Theresa Bradley, female    DOB: 10-22-1951  Age: 73 y.o. MRN: [id number]  Chief Complaint  Patient presents with   Medication Refill    HPI    Tyson is seen for medical follow-up.  She has hypothyroidism and is on replacement.  Due for follow-up thyroid  reassessment.  She has hypertension treated with amlodipine  and HCTZ.  She had electrolytes done per heme-onc back in June which were stable and normal.  She has osteopenia with T-score -2.0 right hip by DEXA scan 4/22.  Declines follow-up at this time but will consider at some point in the next year.  Her main concern has been chronic intermittent cough for years.  Followed by allergist.  Currently maintained on Breo.  A chest x-ray in May reportedly normal.  Recently saw ENT back in August.  They are considering nerve block procedure.  She has also been referred back to pulmonary.  Last PFTs 2015.  No recent hemoptysis.  Never smoked.  Currently working with voice therapist.  She has tried PPIs in the past without any significant improvement.  Past Medical History:  Diagnosis Date   Anxiety    Breast cancer (HCC) 2009   right   Breast cancer (HCC) 2021   Cancer (HCC)    COUGH, CHRONIC 05/10/2010   Dysrhythmia    occationally skips a beat   Family history of brain cancer    Family history of breast cancer    Family history of lung cancer    Family history of multiple myeloma    Family history of skin cancer    GERD (gastroesophageal reflux disease)    HYPERTENSION 04/16/2009   HYPOTHYROIDISM 04/16/2009   OSTEOARTHRITIS 04/16/2009   Personal history of chemotherapy 2021   Personal history of radiation therapy 2009   SENILE LENTIGO 06/10/2010   Past Surgical History:  Procedure Laterality Date   BREAST EXCISIONAL BIOPSY Right    BREAST EXCISIONAL BIOPSY Left    BREAST LUMPECTOMY Right 2009   radiation only   BREAST LUMPECTOMY WITH RADIOACTIVE SEED AND  SENTINEL LYMPH NODE BIOPSY Right 01/31/2020   Procedure: RIGHT BREAST LUMPECTOMY WITH RADIOACTIVE SEED AND SENTINEL LYMPH NODE BIOPSY;  Surgeon: Vanderbilt Ned, MD;  Location: [facility];  Service: General;  Laterality: Right;  PEC BLOCK   BREAST SURGERY  2009   X 2, cancer stage 0, lumpectomy   CESAREAN SECTION     1979, 1982   CHOLECYSTECTOMY     COLONOSCOPY     DILATATION & CURETTAGE/HYSTEROSCOPY WITH MYOSURE  11/2015   HERNIA REPAIR  2009   JOINT REPLACEMENT  2008   hip   MASTECTOMY Right 2021   OPEN SURGICAL REPAIR OF GLUTEAL TENDON Left 07/18/2019   Procedure: Left hip bearing surface revision with gluteal tendon repair;  Surgeon: Melodi Lerner, MD;  Location: [facility];  Service: Orthopedics;  Laterality: Left;  2 hrs   PORTACATH PLACEMENT N/A 03/15/2020   Procedure: INSERTION PORT-A-CATH WITH ULTRASOUND GUIDANCE;  Surgeon: Vanderbilt Ned, MD;  Location: [facility];  Service: General;  Laterality: N/A;   SIMPLE MASTECTOMY WITH AXILLARY SENTINEL NODE BIOPSY Right 03/15/2020   Procedure: RIGHT SIMPLE MASTECTOMY;  Surgeon: Vanderbilt Ned, MD;  Location: [facility];  Service: General;  Laterality: Right;    reports that she has never smoked. She has never used smokeless tobacco. She reports current alcohol use. She reports that she does not use drugs.  family history includes Arthritis in an other family member; Brain cancer in her cousin; Breast cancer in her maternal aunt; Breast cancer (age of onset: 68) in her maternal aunt; Cancer in her paternal aunt and another family member; Dementia in her father; Diabetes in her brother; Heart attack in her father; Hyperlipidemia in her father; Hypertension in her brother and another family member; Lung cancer in her paternal uncle; Multiple myeloma in her paternal uncle; Skin cancer in her father and mother. Allergies  Allergen Reactions   Codeine Sulfate Nausea And Vomiting   Erythromycin Base Other  (See Comments)    Stomach ache   Esomeprazole Magnesium     REACTION: GI upset    Review of Systems  Constitutional:  Negative for chills, fever and malaise/fatigue.  Eyes:  Negative for blurred vision.  Respiratory:  Positive for cough and sputum production. Negative for hemoptysis, shortness of breath and wheezing.   Cardiovascular:  Negative for chest pain.  Neurological:  Negative for dizziness, weakness and headaches.      Objective:     BP 136/76   Pulse (!) 51   Temp 97.9 F (36.6 C) (Oral)   Wt 146 lb 1.6 oz (66.3 kg)   SpO2 97%   BMI 25.08 kg/m  BP Readings from Last 3 Encounters:  10/05/24 136/76  09/22/24 127/77  06/17/24 128/72   Wt Readings from Last 3 Encounters:  10/05/24 146 lb 1.6 oz (66.3 kg)  09/22/24 142 lb (64.4 kg)  05/23/24 142 lb 12.8 oz (64.8 kg)      Physical Exam Vitals reviewed.  Constitutional:      General: She is not in acute distress.    Appearance: She is not ill-appearing.  Cardiovascular:     Rate and Rhythm: Normal rate and regular rhythm.  Pulmonary:     Effort: Pulmonary effort is normal.     Breath sounds: Normal breath sounds. No wheezing.  Musculoskeletal:     Cervical back: Neck supple.     Right lower leg: No edema.     Left lower leg: No edema.  Lymphadenopathy:     Cervical: No cervical adenopathy.  Neurological:     Mental Status: She is alert.      No results found for any visits on 10/05/24.  Last CBC Lab Results  Component Value Date   WBC 6.8 05/23/2024   HGB 12.5 05/23/2024   HCT 37.2 05/23/2024   MCV 88.4 05/23/2024   MCH 29.7 05/23/2024   RDW 12.3 05/23/2024   PLT 304 05/23/2024   Last metabolic panel Lab Results  Component Value Date   GLUCOSE 103 (H) 05/23/2024   NA 141 05/23/2024   K 3.5 05/23/2024   CL 102 05/23/2024   CO2 31 05/23/2024   BUN 14 05/23/2024   CREATININE 1.05 (H) 05/23/2024   GFRNONAA 56 (L) 05/23/2024   CALCIUM 10.2 05/23/2024   PROT 7.4 05/23/2024   ALBUMIN  4.0 05/23/2024   BILITOT 0.4 05/23/2024   ALKPHOS 33 (L) 05/23/2024   AST 12 (L) 05/23/2024   ALT 7 05/23/2024   ANIONGAP 8 05/23/2024   Last lipids Lab Results  Component Value Date   CHOL 201 (H) 02/22/2021   HDL 44.30 02/22/2021   LDLCALC 129 (H) 02/22/2021   LDLDIRECT 155.8 10/24/2013   TRIG 142.0 02/22/2021   CHOLHDL 5 02/22/2021   Last thyroid  functions Lab Results  Component Value Date   TSH 1.56 10/26/2023      The ASCVD Risk score (  Arnett DK, et al., 2019) failed to calculate for the following reasons:   Cannot find a previous HDL lab   Cannot find a previous total cholesterol lab    Assessment & Plan:   #1 hypothyroidism.  Patient on levothyroxine .  Needs follow-up labs.  Recheck TSH today.  Adjust medication if necessary.  #2 hypertension stable.  Refill HCTZ and amlodipine  for 1 year.  She had electrolytes done per hematology back in June which were stable.  Continue low-sodium diet  #3 chronic cough.  She has had this for years.  Has been seen by multiple specialist including allergist, pulmonary, ENT.  She has scheduled follow-up reportedly with pulmonary in December.  Does not have any red flags such as recent weight loss, hemoptysis, fever  #4 osteopenia.  Last DEXA scan 4/22.  Discussed consideration for repeat DEXA at some point this year and she will consider.  Continue regular weightbearing exercise such as walking and calcium vitamin D supplementation   No follow-ups on file.    Wolm Scarlet, MD

## 2024-10-06 ENCOUNTER — Ambulatory Visit: Payer: Self-pay | Admitting: Family Medicine

## 2024-10-06 DIAGNOSIS — M8580 Other specified disorders of bone density and structure, unspecified site: Secondary | ICD-10-CM

## 2024-10-06 DIAGNOSIS — E876 Hypokalemia: Secondary | ICD-10-CM

## 2024-10-10 NOTE — Addendum Note (Signed)
 Addended by: METTA KRISTEN CROME on: 10/10/2024 08:34 AM   Modules accepted: Orders

## 2024-10-10 NOTE — Telephone Encounter (Signed)
 Order placed

## 2024-10-17 ENCOUNTER — Ambulatory Visit (INDEPENDENT_AMBULATORY_CARE_PROVIDER_SITE_OTHER)
Admission: RE | Admit: 2024-10-17 | Discharge: 2024-10-17 | Disposition: A | Discharge status: Home / Self Care | Source: Ambulatory Visit | Attending: Family Medicine | Admitting: Family Medicine

## 2024-10-17 DIAGNOSIS — M8580 Other specified disorders of bone density and structure, unspecified site: Secondary | ICD-10-CM | POA: Diagnosis not present

## 2024-10-17 DIAGNOSIS — M8589 Other specified disorders of bone density and structure, multiple sites: Secondary | ICD-10-CM | POA: Diagnosis not present

## 2024-10-19 ENCOUNTER — Ambulatory Visit

## 2024-10-19 ENCOUNTER — Ambulatory Visit: Payer: Self-pay | Admitting: Family Medicine

## 2024-10-19 DIAGNOSIS — R498 Other voice and resonance disorders: Secondary | ICD-10-CM

## 2024-10-19 DIAGNOSIS — E876 Hypokalemia: Secondary | ICD-10-CM

## 2024-10-19 NOTE — Therapy (Signed)
 OUTPATIENT SPEECH LANGUAGE PATHOLOGY VOICE TREATMENT   Patient Name: Theresa Bradley MRN: [id number] DOB:February 24, 1951, 73 y.o., female Today's Date: 10/19/2024  PCP: Micheal Pin, MD REFERRING PROVIDER: Okey Burns, MD, doc to Dr. Julious Malkin, DO  END OF SESSION:  End of Session - 10/19/24 1236     Visit Number 12    Number of Visits 18    Date for Recertification  11/18/24    SLP Start Time 1234    SLP Stop Time  1315    SLP Time Calculation (min) 41 min    Activity Tolerance Patient tolerated treatment well                Past Medical History:  Diagnosis Date   Anxiety    Breast cancer (HCC) 2009   right   Breast cancer (HCC) 2021   Cancer (HCC)    COUGH, CHRONIC 05/10/2010   Dysrhythmia    occationally skips a beat   Family history of brain cancer    Family history of breast cancer    Family history of lung cancer    Family history of multiple myeloma    Family history of skin cancer    GERD (gastroesophageal reflux disease)    HYPERTENSION 04/16/2009   HYPOTHYROIDISM 04/16/2009   OSTEOARTHRITIS 04/16/2009   Personal history of chemotherapy 2021   Personal history of radiation therapy 2009   SENILE LENTIGO 06/10/2010   Past Surgical History:  Procedure Laterality Date   BREAST EXCISIONAL BIOPSY Right    BREAST EXCISIONAL BIOPSY Left    BREAST LUMPECTOMY Right 2009   radiation only   BREAST LUMPECTOMY WITH RADIOACTIVE SEED AND SENTINEL LYMPH NODE BIOPSY Right 01/31/2020   Procedure: RIGHT BREAST LUMPECTOMY WITH RADIOACTIVE SEED AND SENTINEL LYMPH NODE BIOPSY;  Surgeon: Vanderbilt Ned, MD;  Location: [facility];  Service: General;  Laterality: Right;  PEC BLOCK   BREAST SURGERY  2009   X 2, cancer stage 0, lumpectomy   CESAREAN SECTION     1979, 1982   CHOLECYSTECTOMY     COLONOSCOPY     DILATATION & CURETTAGE/HYSTEROSCOPY WITH MYOSURE  11/2015   HERNIA REPAIR  2009   JOINT REPLACEMENT  2008   hip   MASTECTOMY Right  2021   OPEN SURGICAL REPAIR OF GLUTEAL TENDON Left 07/18/2019   Procedure: Left hip bearing surface revision with gluteal tendon repair;  Surgeon: Melodi Lerner, MD;  Location: [facility];  Service: Orthopedics;  Laterality: Left;  2 hrs   PORTACATH PLACEMENT N/A 03/15/2020   Procedure: INSERTION PORT-A-CATH WITH ULTRASOUND GUIDANCE;  Surgeon: Vanderbilt Ned, MD;  Location: [facility];  Service: General;  Laterality: N/A;   SIMPLE MASTECTOMY WITH AXILLARY SENTINEL NODE BIOPSY Right 03/15/2020   Procedure: RIGHT SIMPLE MASTECTOMY;  Surgeon: Vanderbilt Ned, MD;  Location: [facility];  Service: General;  Laterality: Right;   Patient Active Problem List   Diagnosis Date Noted   Osteopenia 10/05/2024   Port-A-Cath in place 05/15/2020   Genetic testing 02/03/2020   Family history of breast cancer    Family history of skin cancer    Family history of brain cancer    Family history of lung cancer    Family history of multiple myeloma    Malignant neoplasm of upper-outer quadrant of right breast in female, estrogen receptor positive (HCC) 01/04/2020   Melanoma of skin (HCC) 08/15/2019   Failed total hip arthroplasty, 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  Cough 05/31/2014   GERD (gastroesophageal reflux disease) 09/13/2012   Hypothyroidism 04/16/2009   Essential hypertension 04/16/2009   Osteoarthritis 04/16/2009     Onset date: years ago script dated 06/17/24  REFERRING DIAG: R05.3 (ICD-10-CM) - Chronic cough R49.0 (ICD-10-CM) - Dysphonia 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     Pt accompanied by: self  PERTINENT HISTORY:  From [place]at[place]'s note  06/17/24: Theresa Bradley is a 73 year old female with chronic cough who presents with persistent cough and voice changes. She was referred by Dr. Fleeta Smock Allergies for evaluation of her chronic cough for several years and voice changes/dysphonia for several months   She has experienced a chronic cough for many years, which worsened after an illness in December 2024. She also experienced persistent hoarseness at the same time, and has had it since. The cough is associated with phlegm production, sometimes green in color, and occasional throat discomfort. A chest x-ray performed in May was normal. She has a history of asthma and has been prescribed an emergency inhaler, which she has not needed to use in the past month and a half. During her worst symptoms, she coughed up significant amounts of phlegm, which has since decreased but still occurs. She occasionally experiences throat pain, described as being lower in the throat, and has had episodes of difficulty swallowing, though these are not frequent.   Her voice has changed since December. She has a history of allergies and has been receiving allergy shots for years, though she stopped in May due to illness and antibiotic use. She has tried various antihistamines, including Zyrtec and Claritin, without significant relief. She performs daily nasal saline rinses and has a bottle of Flonase .    In the past, she consulted a pulmonologist who diagnosed her with a cyclical cough and provided treatment, but the cough returned. This was approximately seven or eight years ago.  PAIN:  Are you having pain? Yes: NPRS scale: 1/10 Pain location: 1/10 Pain description: tingling, ache Aggravating factors: walking on hard floors Relieving factors: nothing  FALLS: Has patient fallen in last 6 months? No  PATIENT GOALS: Address voice concerns  OBJECTIVE:  Note: Objective measures were completed at Evaluation unless otherwise noted.  DIAGNOSTIC  FINDINGS:  Dharap 09/22/24: The base of tongue was visualized and no mass, ulceration or lesion was appreciated. The epiglottis did not demonstrate any mass, ulceration or lesion. The vallecula was also assessed with no mass, ulceration or lesion. The patient had good glottic closure upon phonation and no signs of aspiration or pooling of secretions. The patient was asked to inspire and expire with the true vocal folds vibrating normally and without evidence of vocal fold dysfunction. Bilateral true vocal cords were noted to have mild atrophy. The true and false vocal cords, interarytenoid, AE folds, and arytenoids did not demonstrate any significant edema or erythema. The patient was then asked to valsalva, and the pyriform sinuses were assessed which were unremarkable. The airway was patent and there was no evidence of compromise. The scope was then slowly withdrawn from the patient. The patient tolerated the procedure well and there were no complications.  Disposition: Stable   Impression & Plans: Assessment and Plan Assessment & Plan Chronic cough  - Refer to pulmonology for  pulmonary function test to assess lung function and rule out residual lung issues. - Consider superior laryngeal nerve block if pulmonary evaluation is unremarkable.   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 Considering vocal cord filler injections - Consider referral to [facility] for specialized evaluation and management.   Chronic postnasal drip with thick mucus Chronic postnasal drip with thick mucus, partially responsive to current antihistamine and nasal spray regimen. - Encourage use of humidifiers to help thin mucus.  [place] 06/17/24: Procedure: Preoperative diagnosis: hoarseness   Postoperative diagnosis:   same VF atrophy and  glottic insufficiency   Procedure: Flexible fiberoptic laryngoscopy with stroboscopy (68420)    Surgeon: Elena Larry, MD   Anesthesia: Topical lidocaine  and Afrin   Complications: None   Condition is stable throughout exam   Indications and consent:   The patient presents to the clinic with hoarseness. All the risks, benefits, and potential complications were reviewed with the patient preoperatively and informed verbal consent was obtained.   Procedure: The patient was seated upright in the exam chair.   Topical lidocaine  and Afrin were applied to the nasal cavity. After adequate anesthesia had occurred, the flexible telescope with strobe capabilities was passed into the nasal cavity. The nasopharynx was patent without mass or lesion. The scope was passed behind the soft palate and directed toward the base of tongue. The base of tongue was visualized and was symmetric with no apparent masses or abnormal appearing tissue. There were no signs of a mass or pooling of secretions in the piriform sinuses. The supraglottic structures were normal.   The true vocal cords are mobile. The medial edges were straight. Closure was complete but with severe supraglottic compression. Periodicity present. The mucosal wave and amplitude were slightly decreased 2/2 straight and supraglottic compression. There is moder ate interarytenoid pachydermia and post cricoid edema. The mucosa appears without lesions.   The laryngoscope was then slowly withdrawn and the patient tolerated the procedure well. There were no complications or blood loss.  Assessment & Plan Chronic cough We discussed common causes of chronic cough and neurogenic cough. Previous treatments for post-nasal drainage and reflux were ineffective. Was seen by Pulm before and diagnosed with cyclical cough. Normal chest x-ray 03/2024. - Review superior laryngeal nerve block information. - Xyzal  5 mg daily and Flonase , and nasal rinses daily - Consider  superior laryngeal nerve block if symptoms persist.   Environmental allergies and Post-nasal drainage  Postnasal drip contributing to chronic cough. Previous treatments ineffective. - Continue nasal saline rinses. - Start Flonase  nasal spray BID - Prescribed Xyzal . - continue allergy shots    Dysphonia Hoarseness x 7 months Strobe today with supraglottic compression, VF atrophy and glottic insufficiency. We discussed a trial of speech therapy - Refer to speech therapy.   RTC 3 months   PATIENT REPORTED OUTCOME MEASURES (PROM): [place] Cough Index: Scored herself 63/133, with lower scores indicating worse QoL and negative affects from pt's cough.  TREATMENT DATE:  AB= Abdominal breathing; SOVTE=Semi-occluded vocal tract exercises, VFE=Vocal function exercises; Resonant Voice Therapy = RVT  10/19/24: Nerve block scheduled for 10/26/24. Resonant voice Therapy (RVT) targeted with /m/ and /b/ laden words. Pt with WNL voicing 65% of the time, instigated coughing after 3-4 minutes of practice - pt stated a feeling in her chest. [name] cont to clear throat gently and sip water throughout session. SLP heard rare congestion in her throat throughout session. Pt with s/sx VCD (the tickle in her throat) throughout all exercises today. SLP told pt to cont to practice with AB, and straw phonation exercises (SOVTE).  10/04/24: Reports that she feels throat clear is still productive. Did inquire to have appointment for nerve block injection following her pulmonologist appointment. Genetle throat clears x19 today. Coughing spell near end of session when SLP attempting to perform PhoRTE with pt at WNL volume level. Pt stated this cough feeling came from her upper chest (touched sternum), and not her throat. She has had a feeling like she needed to cough come from her throat but that has  not occurred for a while. Pt noted to breath with AB. Will cont to complete straw exercises. SLP thinks pt is almost where she was with WNL voice and relaxed nature of voice prior to her latest illness. SLP to consider PhoRTE next session.   09/27/24: Pt was scheduled for ST in previous weeks but was ill, than last week got the time wrong in her planner. Pt enters with vocal quality notably worse than previous session. Extended discussion with pt about how her atrophy and supraglottic compression, and chronic cough  and VCD sx are separate vocal issues. Pt feels she was having more improvement with vocal quality prior to latest URI/cold sx. Today SLP heard pt quietly throat clear 16 times and regular throat clear 3x during session. SLP used SOVTE task of straw phonation with pt to reduce laryngeal tension and encourage coordinated production of the respiratory and phonatory systems. Today, pt independent with production and after 20-30 seconds of using the straw, her voice improved to 7-8/10 quality (10=WNL) for approx 20 seconds in response to SLP questions. SLP told pt to cont to work with straw phonation at home with use of AB.   08/11/24: Pt stated she used her straw phonation prior to talking with her brother, and her mother yesterday and heard a WNL voice for 3-5 minutes afterwards. Today she used straw phonation and did demonstrate WNL voice afterwards for approx 2-3 minutes. When pt noticed harsh/hoarse voice she used straw again and this cycle continued. As the session progressed however, the WNL voicing times decr'd in length, maybe due to fatigue. SLP again explained pt also has vocal fold atrophy which might lead to fatigue quicker than if she did not have atrophy. Pt will use straw throughout the day to foster WNL voicing.  08/08/24: Cough spell lasting 3-4 minutes as [name] entered lobby. Entered clearing throat - total today 5- were very mildly harsh - none were harsh; Pt tapped vocal folds as a  throat clear alternative 15 times and drank water. Pt with little talking today - total 15 minutes. SLP assisted pt with resonant voicing and SOVTE with straw phonation, whooo, and whooooos ______, vocal glides, and AB. Pt has been practicing these at home; Each time pt was able to change her voicing to WNL quality. She also accomplished this today during session x3 after 2-3 minutes of vocal glides with straw and without straw. SLP  reiterated to pt why practice is important with this on a daily basis. Pt to cont this work at home.  08/04/24: Works on AB when trying to go to sleep at night. Today SLP worked with pt with flow phonation and SOVTE tasks; The most success with decr'ing voice quality common with supraglottic compression was straw phonation from high pitch to low pitch as well as high to low pitch whooo's _______? Sheron was also successful to bring pt's speech to more WNL sounding. Homework to use straw high to low pitch, and yawn-sigh for more WNL voicing. SLP used voice rest to mitigate effect of pt's chronic cough which is exacerbated by voice use.   07/28/24: After flow phonation practice last night pt stated she heard her WNL voice for 1 or 2 sentences afterwards. SLP ascertained pt also has sx of hypersensitivity to scent - pt has coughing with strong scents. Mild cough during Pt coughed last night during a meeting where she had to leave the meeting room. SLP had pt practice all chronic cough breathing strategies and stressed AB and sip/swallow to use as well. SLP guided pt through flow phonation practice and pt began coughing within 2 minutes of practice. Pt reported she coughed within 5 minutes of practice with flow phonation since last session but has cont'd to practice with this despite that. Today WNL voice was heard after 4 reps of moo using tissue, preceeded by 8 reps of ooooo with tissue. Then pt began coughing.   07/26/24: SLP provided pt another copy of cough index as she  misplaced the first. Score as above. AB at rest for 5 minutes - 90% success. Meds for congestion helping with congested cough - no longer exists. Voice does not sound as strong - pt states her husband is asking her to repeat more in last 1-2 weeks.  SLP used flow phonation techniques and SOVTE to normalize pt's vocal quality. Blowing with tissue used as visual support - pt with 100% success, with vocalization and exhalation initial 0% success but pt became more successful as SLP continued cueing and demonstrating. SLP added /m/, /s/, and sh in front of oo for feeling of flow phonation. Pt's first sentence after practicing flow phonation was WNL voicing, but then reverted back to that indicative of compression after one sentence. Pt to practice flow phonation tasks covered in ST today for 15 minutes multiple times/day. AFTER 25 MINUTES OF FLOW PHONATION PRACTICE PT EXPERIENCED COUGH SPELL for approx 4 minutes. She used box breathing and pursed lip inhale/exhale for success in stopping sx.  07/20/24: Pt to bring cough index next session. WNL voice quality heard approx 60% today. Pt has cut all caff coffee except one cup with a little decaf in AM. Finds it hard to stop coughing as cough sneaks up on her -tried to use sniff/blow technique. Flonase  has dried up some of the mucous. SLP reviewed procedure for all chronic cough rescue breathing strategies and pt completing each incorrectly. SLP corrected pt's productions and pt was independent.  Pt admitted she has to think about which strategy to use when she starts coughing instead of spontaneously starting one. SLP told pt to cont to practice strategies 20x each/daily to make this easier when she has an episode.   07/13/24: SLP worked with pt today with SOVTE and flow phonation exercises to reduce tension/compression when vocalizing. SLP used /m/, /n/, /u/, and /o/ words and pt initially with 30% improving to 85% success with WNL voicing with pitch variations.  Still rough sounding in lower pitches - possibly due to vocal fold atrophy? Pt will practice phrases  at home as in pt instructions.   06/30/24: SLP educated pt on throat clear alternatives; pt began using water sips after two additional throat clears. SLP guided pt through strategies for chronic cough. SLP then started AB practice, which triggered chronic cough. SLP then guided pt through strategies for chronic cough in the moment, which quelled cough in 90 seconds. Homework to work on AB and to practice chronic cough strategies when not in coughing spell.   06/27/24: Not charged due to not authorized by insurance. SLP educated pt about AB and watched pt as she attempted AB. At rest pt began at 50% success but with occasional min A she incr'd success to 80%. She was told to practice this at home 10-15 minutes BID.  PATIENT EDUCATION: Education details: see treatment date this date, above Person educated: Patient Education method: Programmer, Multimedia, Demonstration, and Verbal cues Education comprehension: verbalized understanding, returned demonstration, verbal cues required, and needs further education  HOME EXERCISE PROGRAM: AB, SOVTE  GOALS: Goals reviewed with patient? Yes  SHORT TERM GOALS: Target date: 07/29/24  Pt will complete AB at rest with 85% success for 5 minutes, in 2 sessions Baseline: 07/26/24 Goal status: partially met  2.  Pt will complete AB in at least 17/20 sentence responses, in two sessions Baseline:  Goal status: not met - focus on compression  3.  To decr laryngeal compression, pt will complete SOVTE tasks in session with 90% success in 2 sessions Baseline: 07/28/24 Goal status: met   LONG TERM GOALS: Target date:  11/18/24  Pt will improve PROM from initial administration Baseline:  Goal status: taken last 2 sessions  2.  Pt will reduce throat clears to <3 in 3 sessions, using compensations PRN Baseline:  Goal status: not met and cont  3.  Pt will exhibit WNL  voice quality for 1 minute following SOVTE or other voice exercises in 2 sessions Baseline:  Goal status: not met and cont  4.  Pt will exhibit WNL voice quality for 5 minutes following SOVTE or other voice exercises in 3 sessions Baseline:  Goal status: not met and cont  5.  Pt will report being able to talk on the phone for 30 minutes without coughing episodes, between 2 sessions, using compensations PRN  Baseline:  Goal status: not met and cont   ASSESSMENT:  CLINICAL IMPRESSION: Pt voice quality better today than compared to previous session. Patient is a 73 y.o. F who was seen today for treatment of voice. See treatment date above for today's date for further details on today's session. She will cont to perform SOVTE to foster WNL phonation and airflow for conversational speech. She will cont to need skilled ST both for chronic cough and for improving voice quality due to vocal fold atrophy and laryngeal compression.  OBJECTIVE IMPAIRMENTS: include voice disorder. These impairments are limiting patient from managing appointments, household responsibilities, ADLs/IADLs, and effectively communicating at home and in community. Factors affecting potential to achieve goals and functional outcome are severity of impairments.. Patient will benefit from skilled SLP services to address above impairments and improve overall function.  REHAB POTENTIAL: Good  PLAN:  SLP FREQUENCY: 1-2x/week  SLP DURATION: 8 weeks  PLANNED INTERVENTIONS: Internal/external aids, Functional tasks, SLP instruction and feedback, Compensatory strategies, Patient/family education, [phone] Treatment of speech (30 or 45 min) , and voice exercises    Daylan Juhnke, CCC-SLP 10/19/2024, 12:37 PM

## 2024-10-26 ENCOUNTER — Ambulatory Visit (INDEPENDENT_AMBULATORY_CARE_PROVIDER_SITE_OTHER)

## 2024-10-27 ENCOUNTER — Ambulatory Visit (INDEPENDENT_AMBULATORY_CARE_PROVIDER_SITE_OTHER)

## 2024-10-27 ENCOUNTER — Encounter (INDEPENDENT_AMBULATORY_CARE_PROVIDER_SITE_OTHER): Payer: Self-pay

## 2024-10-27 DIAGNOSIS — R0982 Postnasal drip: Secondary | ICD-10-CM

## 2024-10-27 DIAGNOSIS — J309 Allergic rhinitis, unspecified: Secondary | ICD-10-CM

## 2024-10-27 DIAGNOSIS — R053 Chronic cough: Secondary | ICD-10-CM

## 2024-10-27 NOTE — Patient Instructions (Signed)
 Superior laryngeal nerve block for chronic cough, throat clearing, or pain  Some patients have symptoms from inflammation or hypersensitivity of the nerve that provides sensation to the inside of the throat. This nerve enters the throat right above the Adam's apple, and there is one on each side. It is called the superior laryngeal nerve.  An injection of lidocaine  and steroid can be given to this area where the sensory nerve enters the throat.  This sometimes helps patients with an oversensitive nerve or cough reflex. It resets an abnormal cough threshold or overactive pain signals from the throat. The steroid acts to decrease any inflammation around the nerve that could be adding to this hypersensitivity.  The injection may help right away, or it may take up to a week or two to start working.  If it hasn't helped at all after 2-3 weeks, Dr Mila will often try a second injection.  Some patients with no response to the first injection still have a good response to the second one.  There is no set schedule for repeating these injections.  It is based entirely on how long they help your symptoms.  If you find the injection helpful but the symptoms come back several months later, then you can repeat it at that time.  Some patients never need a second injection.  Side effects from the injection are mostly related to numbness inside the throat.  If this happens, you may feel one or both sides of your throat go numb. This will last about 1 hour, and you should not swallow anything until the sensation returns to normal, usually about an hour.  It is sometimes frightening to patients when the sensation inside their throat changes, but it is not dangerous.  You may choose to stay in our waiting room until the feeling goes away, but this is not a requirement.  If this happens, it will improve gradually over the hour after the procedure. Other risks include accidental injection into the blood vessels, pain,  temporary swallowing trouble or voice changes, and side effects related to steroids generally, like raised blood sugar or increased appetite. Many patients do not notice any steroid-related side effects from the injection, but if you have never taken steroids or aren't familiar with their effects, you should ask Dr Mila for additional details.  Diabetics should plan to check their blood sugar more frequently in the 2 days after the injection.  The injection takes approximately 1 minute per side, and it is performed during a routine office visit. There are no precautions afterward except for not swallowing for 1 hour if throat numbness occurs. Patients can drive themselves to and from the visit. There is no recovery time, but some patients have mild bruising or tenderness at the injection site.

## 2024-10-31 ENCOUNTER — Encounter: Payer: Self-pay | Admitting: Family Medicine

## 2024-10-31 ENCOUNTER — Ambulatory Visit (HOSPITAL_BASED_OUTPATIENT_CLINIC_OR_DEPARTMENT_OTHER): Admitting: Pulmonary Disease

## 2024-11-02 ENCOUNTER — Ambulatory Visit: Attending: Otolaryngology

## 2024-11-02 DIAGNOSIS — R498 Other voice and resonance disorders: Secondary | ICD-10-CM | POA: Insufficient documentation

## 2024-11-02 NOTE — Progress Notes (Signed)
 HPI:  Discussed the use of AI scribe software for clinical note transcription with the patient, who gave verbal consent to proceed.  History of Present Illness Theresa Bradley is a 73 year old female who presents with in follow up for chronic cough.  She has a persistent sensation of something in her throat, leading to frequent throat clearing. She has discontinued her nightly Xyzal  and has noticed improved alertness in the morning. She continues to use Flonase , two sprays in each nostril in the morning. She has been working with SLP and has noted some mild improvement.   She experiences some drainage when lying down at night, but it does not disturb her sleep. However, she coughs when getting up to use the bathroom at night. When she coughs up phlegm, it is green, although her nasal discharge is clear. No headaches or sinus pressure are present.  She is scheduled to see a pulmonologist next week for further evaluation of her symptoms.      PMH/Meds/All/SocHx/FamHx/ROS: Past Medical History:  Diagnosis Date   Anxiety    Breast cancer (HCC) 2009   right   Breast cancer (HCC) 2021   Cancer (HCC)    COUGH, CHRONIC 05/10/2010   Dysrhythmia    occationally skips a beat   Family history of brain cancer    Family history of breast cancer    Family history of lung cancer    Family history of multiple myeloma    Family history of skin cancer    GERD (gastroesophageal reflux disease)    HYPERTENSION 04/16/2009   HYPOTHYROIDISM 04/16/2009   OSTEOARTHRITIS 04/16/2009   Personal history of chemotherapy 2021   Personal history of radiation therapy 2009   SENILE LENTIGO 06/10/2010   Past Surgical History:  Procedure Laterality Date   BREAST EXCISIONAL BIOPSY Right    BREAST EXCISIONAL BIOPSY Left    BREAST LUMPECTOMY Right 2009   radiation only   BREAST LUMPECTOMY WITH RADIOACTIVE SEED AND SENTINEL LYMPH NODE BIOPSY Right 01/31/2020   Procedure: RIGHT BREAST LUMPECTOMY  WITH RADIOACTIVE SEED AND SENTINEL LYMPH NODE BIOPSY;  Surgeon: Vanderbilt Ned, MD;  Location: [facility];  Service: General;  Laterality: Right;  PEC BLOCK   BREAST SURGERY  2009   X 2, cancer stage 0, lumpectomy   CESAREAN SECTION     1979, 1982   CHOLECYSTECTOMY     COLONOSCOPY     DILATATION & CURETTAGE/HYSTEROSCOPY WITH MYOSURE  11/2015   HERNIA REPAIR  2009   JOINT REPLACEMENT  2008   hip   MASTECTOMY Right 2021   OPEN SURGICAL REPAIR OF GLUTEAL TENDON Left 07/18/2019   Procedure: Left hip bearing surface revision with gluteal tendon repair;  Surgeon: Melodi Lerner, MD;  Location: [facility];  Service: Orthopedics;  Laterality: Left;  2 hrs   PORTACATH PLACEMENT N/A 03/15/2020   Procedure: INSERTION PORT-A-CATH WITH ULTRASOUND GUIDANCE;  Surgeon: Vanderbilt Ned, MD;  Location: [facility];  Service: General;  Laterality: N/A;   SIMPLE MASTECTOMY WITH AXILLARY SENTINEL NODE BIOPSY Right 03/15/2020   Procedure: RIGHT SIMPLE MASTECTOMY;  Surgeon: Vanderbilt Ned, MD;  Location: [facility];  Service: General;  Laterality: Right;   No family history of bleeding disorders, wound healing problems or difficulty with anesthesia.  Social Connections: Unknown (09/14/2023)   Social Connection and Isolation Panel    Frequency of Communication with Friends and Family: More than three times a week    Frequency of Social Gatherings with Friends  and Family: Once a week    Attends Religious Services: Patient declined    Active Member of Clubs or Organizations: No    Attends Engineer, Structural: Not on file    Marital Status: Married    Current Outpatient Medications:    ALPRAZolam  (XANAX ) 0.25 MG tablet, TAKE 1/2 TABLET BY MOUTH AT BEDTIME AS NEEDED FOR INSOMNIA, Disp: 45 tablet, Rfl: 0   amLODipine  (NORVASC ) 5 MG tablet, Take 1 tablet (5 mg total) by mouth daily., Disp: 90 tablet, Rfl: 3   CALCIUM-VITAMIN D PO, Take 2 tablets by mouth daily.,  Disp: , Rfl:    citalopram  (CELEXA ) 20 MG tablet, TAKE 1 TABLET BY MOUTH EVERY DAY, Disp: 90 tablet, Rfl: 1   EPIPEN  2-PAK 0.3 MG/0.3ML SOAJ injection, Inject 0.3 mg into the muscle as needed for anaphylaxis. , Disp: , Rfl:    fluticasone  (FLONASE ) 50 MCG/ACT nasal spray, Place 2 sprays into both nostrils 2 (two) times daily., Disp: 16 g, Rfl: 6   Fluticasone  Furoate-Vilanterol (BREO ELLIPTA IN), Inhale 1 puff into the lungs daily., Disp: , Rfl:    gabapentin  (NEURONTIN ) 300 MG capsule, TAKE 1 CAPSULE BY MOUTH EVERYDAY AT BEDTIME, Disp: 90 capsule, Rfl: 4   hydrochlorothiazide  (MICROZIDE ) 12.5 MG capsule, Take 1 capsule (12.5 mg total) by mouth daily., Disp: 90 capsule, Rfl: 3   levocetirizine (XYZAL ) 5 MG tablet, TAKE 1 TABLET BY MOUTH EVERY DAY IN THE EVENING, Disp: 30 tablet, Rfl: 3   levothyroxine  (SYNTHROID ) 50 MCG tablet, Take 1 tablet (50 mcg total) by mouth daily., Disp: 90 tablet, Rfl: 3   meloxicam  (MOBIC ) 15 MG tablet, TAKE 1 TABLET BY MOUTH EVERY DAY, Disp: 90 tablet, Rfl: 0   polyethylene glycol (MIRALAX  / GLYCOLAX ) 17 g packet, Take 17 g by mouth every other day., Disp: , Rfl:    tamoxifen  (NOLVADEX ) 20 MG tablet, TAKE 1 TABLET BY MOUTH EVERY DAY, Disp: 90 tablet, Rfl: 3   tretinoin  (RETIN-A ) 0.025 % cream, Apply topically at bedtime., Disp: 45 g, Rfl: 6 A complete ROS was performed with pertinent positives/negatives noted in the HPI. The remainder of the ROS are negative.   Physical Exam:  BP 121/70 (BP Location: Left Arm, Patient Position: Sitting, Cuff Size: Normal)   Pulse (!) 59   Wt 145 lb (65.8 kg)   SpO2 94%   BMI 24.89 kg/m  General: Well developed, well nourished. No acute distress. Voice hoarse Head/Face: Normocephalic. No sinus tenderness. Facial nerve intact and equal bilaterally. No facial lacerations. Eyes: PERRL, no scleral icterus or conjunctival hemorrhage. EOMI. Ears: No gross deformity.  Hearing: Normal speech reception.  Nose: No gross deformity or  lesions. No purulent discharge. No turbinate hypertrophy. Mouth/Oropharynx: Lips without any lesions. No mucosal lesions within the oropharynx. No tonsillar enlargement, exudate, or lesions. Pharyngeal walls symmetrical. Uvula midline. Tongue midline without lesions. Larynx: See TFL if applicable Nasopharynx: See TFL if applicable Neck: Trachea midline. No masses. No thyromegaly or nodules palpated. No crepitus. Lymphatic: No lymphadenopathy in the neck. Respiratory: No stridor or distress. Room air. Cardiovascular: Regular rate and rhythm. Extremities: No edema or cyanosis. Warm and well-perfused. Skin: No scars or lesions on face or neck. Neurologic: CN II-XII grossly intact. Moving all extremities without gross abnormality. Other:  Independent Review of Additional Tests or Records: None  Procedures: Procedure note: Superior laryngeal nerve block (35591) and steroid injection  SURGEON: Jaleiah Asay, D.O.  PREOPERATIVE DIAGNOSIS(ES):chronic cough, superior laryngeal neuropathy  POSTOPERATIVE DIAGNOSIS(ES): Same  PROCEDURE PERFORMED: Local  anesthetic and steroid injection to bilateral superior laryngeal nerve(s)  INDICATIONS FOR PROCEDURE: Patient presents with chronic cough refractory to medical management and with findings consistent with superior laryngeal nerve dysfunction. The risks and benefits of the surgical procedure have been explained in detail to the patient and they have elected to proceed.  CONSENT:  Informed consent was obtained prior to the procedure after discussion of risks, benefits, and alternatives and expected outcomes were discussed with the patient; consent placed in chart. The possibilities of reaction to medication, increase in pain, bleeding, infection, prolonged numbness or dysphagia, inadequate treatment response, continued or increased pain, the need for additional procedures, failure to diagnose a condition, and creating a complication requiring transfusion  or operation or additional intervention were discussed with the patient. The patient concurred with the proposed plan, giving informed consent.    UNIVERSAL PROTOCOL/ TIMEOUT: Preprocedure verification is complete- patient verified and consents confirmed.  H&P REVIEW: The patient's history and physical were reviewed today prior to procedure. All medications were reviewed and updated as well.  ANESTHESIA: lidocaine  1% without epi  PROCEDURE DETAILS: The patient was brought to the clinic and placed in a seated position.  The thyroid  cartilage and hyoid bone were palpated and the thyrohyoid membrane was identified. 1ml of kenalog 40mg /ml and 1ml of local anesthetic were mixed and injected at the site of bilateral superior laryngeal nerve entry through the thyrohyoid membrane, taking great care to avoid intra-arterial injection. This concluded the procedure. There were no complications and the patient tolerated the procedure well.  ESTIMATED BLOOD LOSS: Minimal  FINDINGS:  No evidence of significant bleeding or other complication  CONDITION: Stable  COMPLICATIONS: no significant complications  DISPOSITION: The patient tolerated the procedure well without apparent complications and was ambulatory.  NOTES: Will return in 3-4 weeks for repeat injection    Impression & Plans:  Assessment & Plan Chronic cough Persistent cough with green sputum. Possible pulmonary etiology considered. - Performed SLN block in office today - Monitor response for 2-3 weeks. - Consider repeat injection if no improvement. - Recommend evaluation by pulmonology given productive cough - Continue SLP management  Chronic postnasal drip Persistent postnasal drip with clear discharge, no sinus pressure or headache. - Continue Flonase  daily  Allergic rhinitis - Continue Flonase , two sprays each nostril in the morning.  Follow-up in 3-4 weeks for repeat injection.  Adah Malkin, DO [facility]

## 2024-11-02 NOTE — Therapy (Signed)
 OUTPATIENT SPEECH LANGUAGE PATHOLOGY VOICE TREATMENT   Patient Name: Theresa Bradley MRN: [id number] DOB:01-25-1951, 73 y.o., female Today's Date: 11/02/2024  PCP: Theresa Pin, MD REFERRING PROVIDER: Okey Burns, MD, doc to Dr. Julious Malkin, DO  END OF SESSION:  End of Session - 11/02/24 1550     Visit Number 13    Number of Visits 18    Date for Recertification  11/18/24    SLP Start Time 1452    SLP Stop Time  1532    SLP Time Calculation (min) 40 min    Activity Tolerance Patient tolerated treatment well                 Past Medical History:  Diagnosis Date   Anxiety    Breast cancer (HCC) 2009   right   Breast cancer (HCC) 2021   Cancer (HCC)    COUGH, CHRONIC 05/10/2010   Dysrhythmia    occationally skips a beat   Family history of brain cancer    Family history of breast cancer    Family history of lung cancer    Family history of multiple myeloma    Family history of skin cancer    GERD (gastroesophageal reflux disease)    HYPERTENSION 04/16/2009   HYPOTHYROIDISM 04/16/2009   OSTEOARTHRITIS 04/16/2009   Personal history of chemotherapy 2021   Personal history of radiation therapy 2009   SENILE LENTIGO 06/10/2010   Past Surgical History:  Procedure Laterality Date   BREAST EXCISIONAL BIOPSY Right    BREAST EXCISIONAL BIOPSY Left    BREAST LUMPECTOMY Right 2009   radiation only   BREAST LUMPECTOMY WITH RADIOACTIVE SEED AND SENTINEL LYMPH NODE BIOPSY Right 01/31/2020   Procedure: RIGHT BREAST LUMPECTOMY WITH RADIOACTIVE SEED AND SENTINEL LYMPH NODE BIOPSY;  Surgeon: Theresa Ned, MD;  Location: [facility];  Service: General;  Laterality: Right;  PEC BLOCK   BREAST SURGERY  2009   X 2, cancer stage 0, lumpectomy   CESAREAN SECTION     1979, 1982   CHOLECYSTECTOMY     COLONOSCOPY     DILATATION & CURETTAGE/HYSTEROSCOPY WITH MYOSURE  11/2015   HERNIA REPAIR  2009   JOINT REPLACEMENT  2008   hip   MASTECTOMY Right  2021   OPEN SURGICAL REPAIR OF GLUTEAL TENDON Left 07/18/2019   Procedure: Left hip bearing surface revision with gluteal tendon repair;  Surgeon: Theresa Lerner, MD;  Location: [facility];  Service: Orthopedics;  Laterality: Left;  2 hrs   PORTACATH PLACEMENT N/A 03/15/2020   Procedure: INSERTION PORT-A-CATH WITH ULTRASOUND GUIDANCE;  Surgeon: Theresa Ned, MD;  Location: [facility];  Service: General;  Laterality: N/A;   SIMPLE MASTECTOMY WITH AXILLARY SENTINEL NODE BIOPSY Right 03/15/2020   Procedure: RIGHT SIMPLE MASTECTOMY;  Surgeon: Theresa Ned, MD;  Location: [facility];  Service: General;  Laterality: Right;   Patient Active Problem List   Diagnosis Date Noted   Osteopenia 10/05/2024   Port-A-Cath in place 05/15/2020   Genetic testing 02/03/2020   Family history of breast cancer    Family history of skin cancer    Family history of brain cancer    Family history of lung cancer    Family history of multiple myeloma    Malignant neoplasm of upper-outer quadrant of right breast in female, estrogen receptor positive (HCC) 01/04/2020   Melanoma of skin (HCC) 08/15/2019   Failed total hip arthroplasty, 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  Cough 05/31/2014   GERD (gastroesophageal reflux disease) 09/13/2012   Hypothyroidism 04/16/2009   Essential hypertension 04/16/2009   Osteoarthritis 04/16/2009     Onset date: years ago script dated 06/17/24  REFERRING DIAG: R05.3 (ICD-10-CM) - Chronic cough R49.0 (ICD-10-CM) - Dysphonia 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     Pt accompanied by: self  PERTINENT HISTORY:  From Theresa Bradley's note  06/17/24: Theresa Bradley is a 73 year old female with chronic cough who presents with persistent cough and voice changes. She was referred by Dr. Fleeta Bradley Allergies for evaluation of her chronic cough for several years and voice changes/dysphonia for several months   She has experienced a chronic cough for many years, which worsened after an illness in December 2024. She also experienced persistent hoarseness at the same time, and has had it since. The cough is associated with phlegm production, sometimes green in color, and occasional throat discomfort. A chest x-ray performed in May was normal. She has a history of asthma and has been prescribed an emergency inhaler, which she has not needed to use in the past month and a half. During her worst symptoms, she coughed up significant amounts of phlegm, which has since decreased but still occurs. She occasionally experiences throat pain, described as being lower in the throat, and has had episodes of difficulty swallowing, though these are not frequent.   Her voice has changed since December. She has a history of allergies and has been receiving allergy shots for years, though she stopped in May due to illness and antibiotic use. She has tried various antihistamines, including Zyrtec and Claritin, without significant relief. She performs daily nasal saline rinses and has a bottle of Flonase .    In the past, she consulted a pulmonologist who diagnosed her with a cyclical cough and provided treatment, but the cough returned. This was approximately seven or eight years ago.  PAIN:  Are you having pain? Yes: NPRS scale: 1/10 Pain location: 1/10 Pain description: tingling, ache Aggravating factors: walking on hard floors Relieving factors: nothing  FALLS: Has patient fallen in last 6 months? No  PATIENT GOALS: Address voice concerns  OBJECTIVE:  Note: Objective measures were completed at Evaluation unless otherwise noted.  DIAGNOSTIC  FINDINGS:  Theresa Bradley 09/22/24: The base of tongue was visualized and no mass, ulceration or lesion was appreciated. The epiglottis did not demonstrate any mass, ulceration or lesion. The vallecula was also assessed with no mass, ulceration or lesion. The patient had good glottic closure upon phonation and no signs of aspiration or pooling of secretions. The patient was asked to inspire and expire with the true vocal folds vibrating normally and without evidence of vocal fold dysfunction. Bilateral true vocal cords were noted to have mild atrophy. The true and false vocal cords, interarytenoid, AE folds, and arytenoids did not demonstrate any significant edema or erythema. The patient was then asked to valsalva, and the pyriform sinuses were assessed which were unremarkable. The airway was patent and there was no evidence of compromise. The scope was then slowly withdrawn from the patient. The patient tolerated the procedure well and there were no complications.  Disposition: Stable   Impression & Plans: Assessment and Plan Assessment & Plan Chronic cough  - Refer to pulmonology for  pulmonary function test to assess lung function and rule out residual lung issues. - Consider superior laryngeal nerve block if pulmonary evaluation is unremarkable.   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 Considering vocal cord filler injections - Consider referral to [facility] for specialized evaluation and management.   Chronic postnasal drip with thick mucus Chronic postnasal drip with thick mucus, partially responsive to current antihistamine and nasal spray regimen. - Encourage use of humidifiers to help thin mucus.  Theresa Bradley 06/17/24: Procedure: Preoperative diagnosis: hoarseness   Postoperative diagnosis:   same VF atrophy and  glottic insufficiency   Procedure: Flexible fiberoptic laryngoscopy with stroboscopy (68420)    Surgeon: Elena Larry, MD   Anesthesia: Topical lidocaine  and Afrin   Complications: None   Condition is stable throughout exam   Indications and consent:   The patient presents to the clinic with hoarseness. All the risks, benefits, and potential complications were reviewed with the patient preoperatively and informed verbal consent was obtained.   Procedure: The patient was seated upright in the exam chair.   Topical lidocaine  and Afrin were applied to the nasal cavity. After adequate anesthesia had occurred, the flexible telescope with strobe capabilities was passed into the nasal cavity. The nasopharynx was patent without mass or lesion. The scope was passed behind the soft palate and directed toward the base of tongue. The base of tongue was visualized and was symmetric with no apparent masses or abnormal appearing tissue. There were no signs of a mass or pooling of secretions in the piriform sinuses. The supraglottic structures were normal.   The true vocal cords are mobile. The medial edges were straight. Closure was complete but with severe supraglottic compression. Periodicity present. The mucosal wave and amplitude were slightly decreased 2/2 straight and supraglottic compression. There is moder ate interarytenoid pachydermia and post cricoid edema. The mucosa appears without lesions.   The laryngoscope was then slowly withdrawn and the patient tolerated the procedure well. There were no complications or blood loss.  Assessment & Plan Chronic cough We discussed common causes of chronic cough and neurogenic cough. Previous treatments for post-nasal drainage and reflux were ineffective. Was seen by Pulm before and diagnosed with cyclical cough. Normal chest x-ray 03/2024. - Review superior laryngeal nerve block information. - Xyzal  5 mg daily and Flonase , and nasal rinses daily - Consider  superior laryngeal nerve block if symptoms persist.   Environmental allergies and Post-nasal drainage  Postnasal drip contributing to chronic cough. Previous treatments ineffective. - Continue nasal saline rinses. - Start Flonase  nasal spray BID - Prescribed Xyzal . - continue allergy shots    Dysphonia Hoarseness x 7 months Strobe today with supraglottic compression, VF atrophy and glottic insufficiency. We discussed a trial of speech therapy - Refer to speech therapy.   RTC 3 months   PATIENT REPORTED OUTCOME MEASURES (PROM): [place] Cough Index: Scored herself 63/133, with lower scores indicating worse QoL and negative affects from pt's cough.  TREATMENT DATE:  AB= Abdominal breathing; SOVTE=Semi-occluded vocal tract exercises, VFE=Vocal function exercises; Resonant Voice Therapy = RVT  11/02/24: ENT with nerve block on 10/27/24. Pt reports very little/no change. Voice sounds most WNL as SLP has heard [name]'s voice. She has practiced with RVT tasks 2-3 minutes at various times throughout the day since last session. Today SLP reviewed laryngeal control strategies as well as visualizations to use with chronic cough and VCD s/sx. Pt to receive additional injection 11/28/24 - pulmonology was rescheduled to 11/25/24. She would like to schedule her next appointment after these appointments. SLP agrees. 26 throat clears this session; notably less than last 2 sessions.    10/19/24: Nerve block scheduled for 10/26/24. Resonant voice Therapy (RVT) targeted with /m/ and /b/ laden words. Pt with WNL voicing 65% of the time, instigated coughing after 3-4 minutes of practice - pt stated a feeling in her chest. [name] cont to clear throat gently and sip water throughout session. SLP heard rare congestion in her throat throughout session. Pt with s/sx VCD (the tickle in her throat)  throughout all exercises today. SLP told pt to cont to practice with AB, and straw phonation exercises (SOVTE).  10/04/24: Reports that she feels throat clear is still productive. Did inquire to have appointment for nerve block injection following her pulmonologist appointment. Genetle throat clears x19 today. Coughing spell near end of session when SLP attempting to perform PhoRTE with pt at WNL volume level. Pt stated this cough feeling came from her upper chest (touched sternum), and not her throat. She has had a feeling like she needed to cough come from her throat but that has not occurred for a while. Pt noted to breath with AB. Will cont to complete straw exercises. SLP thinks pt is almost where she was with WNL voice and relaxed nature of voice prior to her latest illness. SLP to consider PhoRTE next session.   09/27/24: Pt was scheduled for ST in previous weeks but was ill, than last week got the time wrong in her planner. Pt enters with vocal quality notably worse than previous session. Extended discussion with pt about how her atrophy and supraglottic compression, and chronic cough  and VCD sx are separate vocal issues. Pt feels she was having more improvement with vocal quality prior to latest URI/cold sx. Today SLP heard pt quietly throat clear 16 times and regular throat clear 3x during session. SLP used SOVTE task of straw phonation with pt to reduce laryngeal tension and encourage coordinated production of the respiratory and phonatory systems. Today, pt independent with production and after 20-30 seconds of using the straw, her voice improved to 7-8/10 quality (10=WNL) for approx 20 seconds in response to SLP questions. SLP told pt to cont to work with straw phonation at home with use of AB.   08/11/24: Pt stated she used her straw phonation prior to talking with her brother, and her mother yesterday and heard a WNL voice for 3-5 minutes afterwards. Today she used straw phonation and did  demonstrate WNL voice afterwards for approx 2-3 minutes. When pt noticed harsh/hoarse voice she used straw again and this cycle continued. As the session progressed however, the WNL voicing times decr'd in length, maybe due to fatigue. SLP again explained pt also has vocal fold atrophy which might lead to fatigue quicker than if she did not have atrophy. Pt will use straw throughout the day to foster WNL voicing.  08/08/24: Cough spell lasting 3-4 minutes as [name] entered lobby.  Entered clearing throat - total today 5- were very mildly harsh - none were harsh; Pt tapped vocal folds as a throat clear alternative 15 times and drank water. Pt with little talking today - total 15 minutes. SLP assisted pt with resonant voicing and SOVTE with straw phonation, whooo, and whooooos ______, vocal glides, and AB. Pt has been practicing these at home; Each time pt was able to change her voicing to WNL quality. She also accomplished this today during session x3 after 2-3 minutes of vocal glides with straw and without straw. SLP reiterated to pt why practice is important with this on a daily basis. Pt to cont this work at home.  08/04/24: Works on AB when trying to go to sleep at night. Today SLP worked with pt with flow phonation and SOVTE tasks; The most success with decr'ing voice quality common with supraglottic compression was straw phonation from high pitch to low pitch as well as high to low pitch whooo's _______? Sheron was also successful to bring pt's speech to more WNL sounding. Homework to use straw high to low pitch, and yawn-sigh for more WNL voicing. SLP used voice rest to mitigate effect of pt's chronic cough which is exacerbated by voice use.   07/28/24: After flow phonation practice last night pt stated she heard her WNL voice for 1 or 2 sentences afterwards. SLP ascertained pt also has sx of hypersensitivity to scent - pt has coughing with strong scents. Mild cough during Pt coughed last night  during a meeting where she had to leave the meeting room. SLP had pt practice all chronic cough breathing strategies and stressed AB and sip/swallow to use as well. SLP guided pt through flow phonation practice and pt began coughing within 2 minutes of practice. Pt reported she coughed within 5 minutes of practice with flow phonation since last session but has cont'd to practice with this despite that. Today WNL voice was heard after 4 reps of moo using tissue, preceeded by 8 reps of ooooo with tissue. Then pt began coughing.   07/26/24: SLP provided pt another copy of cough index as she misplaced the first. Score as above. AB at rest for 5 minutes - 90% success. Meds for congestion helping with congested cough - no longer exists. Voice does not sound as strong - pt states her husband is asking her to repeat more in last 1-2 weeks.  SLP used flow phonation techniques and SOVTE to normalize pt's vocal quality. Blowing with tissue used as visual support - pt with 100% success, with vocalization and exhalation initial 0% success but pt became more successful as SLP continued cueing and demonstrating. SLP added /m/, /s/, and sh in front of oo for feeling of flow phonation. Pt's first sentence after practicing flow phonation was WNL voicing, but then reverted back to that indicative of compression after one sentence. Pt to practice flow phonation tasks covered in ST today for 15 minutes multiple times/day. AFTER 25 MINUTES OF FLOW PHONATION PRACTICE PT EXPERIENCED COUGH SPELL for approx 4 minutes. She used box breathing and pursed lip inhale/exhale for success in stopping sx.  07/20/24: Pt to bring cough index next session. WNL voice quality heard approx 60% today. Pt has cut all caff coffee except one cup with a little decaf in AM. Finds it hard to stop coughing as cough sneaks up on her -tried to use sniff/blow technique. Flonase  has dried up some of the mucous. SLP reviewed procedure for all chronic cough  rescue  breathing strategies and pt completing each incorrectly. SLP corrected pt's productions and pt was independent.  Pt admitted she has to think about which strategy to use when she starts coughing instead of spontaneously starting one. SLP told pt to cont to practice strategies 20x each/daily to make this easier when she has an episode.   07/13/24: SLP worked with pt today with SOVTE and flow phonation exercises to reduce tension/compression when vocalizing. SLP used /m/, /n/, /u/, and /o/ words and pt initially with 30% improving to 85% success with WNL voicing with pitch variations. Still rough sounding in lower pitches - possibly due to vocal fold atrophy? Pt will practice phrases  at home as in pt instructions.   06/30/24: SLP educated pt on throat clear alternatives; pt began using water sips after two additional throat clears. SLP guided pt through strategies for chronic cough. SLP then started AB practice, which triggered chronic cough. SLP then guided pt through strategies for chronic cough in the moment, which quelled cough in 90 seconds. Homework to work on AB and to practice chronic cough strategies when not in coughing spell.   06/27/24: Not charged due to not authorized by insurance. SLP educated pt about AB and watched pt as she attempted AB. At rest pt began at 50% success but with occasional min A she incr'd success to 80%. She was told to practice this at home 10-15 minutes BID.  PATIENT EDUCATION: Education details: see treatment date this date, above Person educated: Patient Education method: Programmer, Multimedia, Demonstration, and Verbal cues Education comprehension: verbalized understanding, returned demonstration, verbal cues required, and needs further education  HOME EXERCISE PROGRAM: AB, SOVTE  GOALS: Goals reviewed with patient? Yes  SHORT TERM GOALS: Target date: 07/29/24  Pt will complete AB at rest with 85% success for 5 minutes, in 2 sessions Baseline: 07/26/24 Goal  status: partially met  2.  Pt will complete AB in at least 17/20 sentence responses, in two sessions Baseline:  Goal status: not met - focus on compression  3.  To decr laryngeal compression, pt will complete SOVTE tasks in session with 90% success in 2 sessions Baseline: 07/28/24 Goal status: met   LONG TERM GOALS: Target date:  11/18/24  Pt will improve PROM from initial administration Baseline:  Goal status: taken last 2 sessions  2.  Pt will reduce throat clears to <3 in 3 sessions, using compensations PRN Baseline:  Goal status: cont  3.  Pt will exhibit WNL voice quality for 1 minute following SOVTE or other voice exercises in 2 sessions Baseline: 11/02/24 Goal status: cont  4.  Pt will exhibit WNL voice quality for 5 minutes following SOVTE or other voice exercises in 3 sessions Baseline:  Goal status: cont  5.  Pt will report being able to talk on the phone for 30 minutes without coughing episodes, between 2 sessions, using compensations PRN  Baseline:  Goal status: cont   ASSESSMENT:  CLINICAL IMPRESSION: Pt voice quality better today than compared to previous 2-3 sessions. Patient is a 73 y.o. F who was seen today for treatment of voice. See treatment date above for today's date for further details on today's session. She will cont to perform SOVTE and RVT tasks to foster WNL phonation and airflow for conversational speech. She will cont to need skilled ST both for chronic cough and for improving voice quality due to vocal fold atrophy and laryngeal compression.  OBJECTIVE IMPAIRMENTS: include voice disorder. These impairments are limiting patient from managing appointments,  household responsibilities, ADLs/IADLs, and effectively communicating at home and in community. Factors affecting potential to achieve goals and functional outcome are severity of impairments.. Patient will benefit from skilled SLP services to address above impairments and improve overall  function.  REHAB POTENTIAL: Good  PLAN:  SLP FREQUENCY: 1-2x/week  SLP DURATION: 8 weeks  PLANNED INTERVENTIONS: Internal/external aids, Functional tasks, SLP instruction and feedback, Compensatory strategies, Patient/family education, [phone] Treatment of speech (30 or 45 min) , and voice exercises    Lillah Standre, CCC-SLP 11/02/2024, 3:51 PM

## 2024-11-04 ENCOUNTER — Other Ambulatory Visit: Payer: Self-pay | Admitting: Family Medicine

## 2024-11-09 ENCOUNTER — Ambulatory Visit

## 2024-11-10 ENCOUNTER — Ambulatory Visit: Admitting: Family Medicine

## 2024-11-10 DIAGNOSIS — Z0000 Encounter for general adult medical examination without abnormal findings: Secondary | ICD-10-CM | POA: Diagnosis not present

## 2024-11-10 NOTE — Progress Notes (Signed)
 ----------------------------------------------------------------------------------------------------------------------------------------------------------------------------------------------------------------------  Because this visit was a virtual/telehealth visit, some criteria may be missing or patient reported. Any vitals not documented were not able to be obtained and vitals that have been documented are patient reported.    MEDICARE ANNUAL PREVENTIVE VISIT WITH PROVIDER: (Welcome to Medicare, initial annual wellness or annual wellness exam)  Virtual Visit via Video Note  I connected with Theresa Bradley on 11/10/2024 by a video enabled telemedicine application and verified that I am speaking with the correct person using two identifiers.  Location patient: home Location provider:work or home office Persons participating in the virtual visit: patient, provider  Concerns and/or follow up today: detailed intake and risks/health assessment completed in flowsheets and below - please see for details. Stable overall. Seeing ENT for a cough. Also seeing a pulmonologist in January. Has bad allergies.   How often do you have a drink containing alcohol?1 x per month How many drinks containing alcohol do you have on a typical day when you are drinking?1 How often do you have six or more drinks on one occasion?never Have you ever smoked?n Quit date if applicable? na  How many packs a day do/did you smoke? na Do you use smokeless tobacco?n Do you use an illicit drugs?n Do you feel safe at home?y Last dentist visit?goes twice a year Last eye Exam and location?goes on regular basis, Dr. Charmayne   See HM section in Epic for other details of completed HM.    ROS: negative for report of fevers, unintentional weight loss, vision changes, vision loss, hearing loss or change, chest pain, sob, hemoptysis, melena, hematochezia, hematuria or bleeding or bruising  Patient-completed extensive health  risk assessment - reviewed and discussed with the patient: See Health Risk Assessment completed with patient prior to the visit either above or in recent phone note. This was reviewed in detailed with the patient today and appropriate recommendations, orders and referrals were placed as needed per Summary below and patient instructions.   Review of Medical History: -PMH, PSH, Family History and current specialty and care providers reviewed and updated and listed below   Patient Care Team: Micheal Wolm ORN, MD as PCP - General Fleeta Smock, Lamar BROCKS, MD as Consulting Physician (Allergy and Immunology) Elspeth Lauraine DEL, OD as Referring Physician Vanderbilt Ned, MD as Consulting Physician (General Surgery) Izell Lauraine, MD as Attending Physician (Radiation Oncology) Court Pulling, MD as Referring Physician (Dermatology) Rolan Ezra RAMAN, MD as Consulting Physician (Cardiology) Charmayne Molly, MD as Consulting Physician (Ophthalmology)   Past Medical History:  Diagnosis Date   Anxiety    Breast cancer [facility]) 2009   right   Breast cancer (HCC) 2021   Cancer (HCC)    COUGH, CHRONIC 05/10/2010   Dysrhythmia    occationally skips a beat   Family history of brain cancer    Family history of breast cancer    Family history of lung cancer    Family history of multiple myeloma    Family history of skin cancer    GERD (gastroesophageal reflux disease)    HYPERTENSION 04/16/2009   HYPOTHYROIDISM 04/16/2009   OSTEOARTHRITIS 04/16/2009   Personal history of chemotherapy 2021   Personal history of radiation therapy 2009   SENILE LENTIGO 06/10/2010    Past Surgical History:  Procedure Laterality Date   BREAST EXCISIONAL BIOPSY Right    BREAST EXCISIONAL BIOPSY Left    BREAST LUMPECTOMY Right 2009   radiation only   BREAST LUMPECTOMY WITH RADIOACTIVE SEED AND SENTINEL LYMPH NODE  BIOPSY Right 01/31/2020   Procedure: RIGHT BREAST LUMPECTOMY WITH RADIOACTIVE SEED AND SENTINEL LYMPH  NODE BIOPSY;  Surgeon: Vanderbilt Ned, MD;  Location: [facility];  Service: General;  Laterality: Right;  PEC BLOCK   BREAST SURGERY  2009   X 2, cancer stage 0, lumpectomy   CESAREAN SECTION     1979, 1982   CHOLECYSTECTOMY     COLONOSCOPY     DILATATION & CURETTAGE/HYSTEROSCOPY WITH MYOSURE  11/2015   HERNIA REPAIR  2009   JOINT REPLACEMENT  2008   hip   MASTECTOMY Right 2021   OPEN SURGICAL REPAIR OF GLUTEAL TENDON Left 07/18/2019   Procedure: Left hip bearing surface revision with gluteal tendon repair;  Surgeon: Melodi Lerner, MD;  Location: [facility];  Service: Orthopedics;  Laterality: Left;  2 hrs   PORTACATH PLACEMENT N/A 03/15/2020   Procedure: INSERTION PORT-A-CATH WITH ULTRASOUND GUIDANCE;  Surgeon: Vanderbilt Ned, MD;  Location: [facility];  Service: General;  Laterality: N/A;   SIMPLE MASTECTOMY WITH AXILLARY SENTINEL NODE BIOPSY Right 03/15/2020   Procedure: RIGHT SIMPLE MASTECTOMY;  Surgeon: Vanderbilt Ned, MD;  Location: [facility];  Service: General;  Laterality: Right;    Social History   Socioeconomic History   Marital status: Married    Spouse name: Not on file   Number of children: 2   Years of education: Not on file   Highest education level: Bachelor's degree (e.g., BA, AB, BS)  Occupational History   Occupation: Veterinary Surgeon    Comment: Full-time   Occupation: Wellsite Geologist: [employer]    Comment: Retired  Tobacco Use   Smoking status: Never   Smokeless tobacco: Never   Tobacco comments:    father smoked when she was younger   Advertising Account Planner   Vaping status: Never Used  Substance and Sexual Activity   Alcohol use: Yes    Comment: occasionally    Drug use: No   Sexual activity: Not on file  Other Topics Concern   Not on file  Social History Narrative   Lives with husband on 2 level house, has two sons, one local with grandkids   Works FT as veterinary surgeon; building control surveyor   Enjoys working out at gym  about 2X/week, attends silver sneakers classes   Dad struggles with dementia, mother still living who is caretaker; occasionally travels to visit to assist mother.   Social Drivers of Health   Tobacco Use: Low Risk (11/02/2024)   Patient History    Smoking Tobacco Use: Never    Smokeless Tobacco Use: Never    Passive Exposure: Not on file  Financial Resource Strain: Low Risk (11/07/2024)   Overall Financial Resource Strain (CARDIA)    Difficulty of Paying Living Expenses: Not hard at all  Food Insecurity: No Food Insecurity (11/07/2024)   Epic    Worried About Programme Researcher, Broadcasting/film/video in the Last Year: Never true    Ran Out of Food in the Last Year: Never true  Transportation Needs: No Transportation Needs (11/07/2024)   Epic    Lack of Transportation (Medical): No    Lack of Transportation (Non-Medical): No  Physical Activity: Unknown (11/07/2024)   Exercise Vital Sign    Days of Exercise per Week: 1 day    Minutes of Exercise per Session: Not on file  Stress: No Stress Concern Present (11/10/2024)   [employer] of Occupational Health - Occupational Stress Questionnaire    Feeling of Stress:  Not at all  Social Connections: Moderately Isolated (11/07/2024)   Social Connection and Isolation Panel    Frequency of Communication with Friends and Family: More than three times a week    Frequency of Social Gatherings with Friends and Family: Once a week    Attends Religious Services: Patient declined    Database Administrator or Organizations: No    Attends Engineer, Structural: Not on file    Marital Status: Married  Catering Manager Violence: Not At Risk (01/14/2022)   Humiliation, Afraid, Rape, and Kick questionnaire    Fear of Current or Ex-Partner: No    Emotionally Abused: No    Physically Abused: No    Sexually Abused: No  Depression (PHQ2-9): Low Risk (11/10/2024)   Depression (PHQ2-9)    PHQ-2 Score: 0  Alcohol Screen: Low Risk (11/07/2024)   Alcohol Screen     Last Alcohol Screening Score (AUDIT): 1  Housing: Low Risk (11/07/2024)   Epic    Unable to Pay for Housing in the Last Year: No    Number of Times Moved in the Last Year: 0    Homeless in the Last Year: No  Utilities: Not At Risk (09/19/2022)   AHC Utilities    Threatened with loss of utilities: No  Health Literacy: Not on file    Family History  Problem Relation Age of Onset   Skin cancer Mother    Hyperlipidemia Father    Dementia Father    Skin cancer Father    Heart attack Father    Diabetes Brother    Hypertension Brother    Breast cancer Maternal Aunt 49   Breast cancer Maternal Aunt        dx. mid-70s   Cancer Paternal Aunt        unknown type, dx. >50   Lung cancer Paternal Uncle    Multiple myeloma Paternal Uncle    Brain cancer Cousin        dx. 81s, paternal cousin   Arthritis Other    Hypertension Other    Cancer Other        2 aunts   Colon cancer Neg Hx    Esophageal cancer Neg Hx    Pancreatic cancer Neg Hx    Stomach cancer Neg Hx     Medications Ordered Prior to Encounter[1]  Allergies[2]     Physical Exam Vitals requested from patient and listed below if patient had equipment and was able to obtain at home for this virtual visit: There were no vitals filed for this visit. Estimated body mass index is 24.89 kg/m as calculated from the following:   Height as of 05/23/24: 5' 4 (1.626 m).   Weight as of 10/27/24: 145 lb (65.8 kg).  EKG (optional): deferred due to virtual visit  GENERAL: alert, oriented, no acute distress detected, full vision exam deferred due to pandemic and/or virtual encounter   HEENT: atraumatic, conjunttiva clear, no obvious abnormalities on inspection of external nose and ears  NECK: normal movements of the head and neck  LUNGS: on inspection no signs of respiratory distress, breathing rate appears normal, no obvious gross SOB, gasping or wheezing  CV: no obvious cyanosis  MS: moves all visible extremities  without noticeable abnormality  PSYCH/NEURO: pleasant and cooperative, no obvious depression or anxiety, speech and thought processing grossly intact, Cognitive function grossly intact  [employer] Visit from 09/15/2023 in [facility]  PHQ-9 Total Score 2  11/10/2024    4:04 PM 10/05/2024   10:30 AM 09/15/2023   10:28 AM 01/15/2023    3:27 PM 09/19/2022   10:56 AM  Depression screen PHQ 2/9  Decreased Interest 0 0 0 0 0  Down, Depressed, Hopeless 0 0 0 0 0  PHQ - 2 Score 0 0 0 0 0  Altered sleeping   1 2   Tired, decreased energy   1 1   Change in appetite   0 1   Feeling bad or failure about yourself    0 0   Trouble concentrating   0 0   Moving slowly or fidgety/restless   0 0   Suicidal thoughts   0 0   PHQ-9 Score   2  4    Difficult doing work/chores   Not difficult at all Not difficult at all      Data saved with a previous flowsheet row definition       11/23/2022   11:05 AM 01/15/2023    3:26 PM 10/05/2024   10:30 AM 11/07/2024    2:13 PM 11/10/2024    3:52 PM  Fall Risk  Falls in the past year? 0  0 0 0    Was there an injury with Fall?  0  0     Fall Risk Category Calculator  0 0    Patient at Risk for Falls Due to   No Fall Risks  No Fall Risks  Fall risk Follow up   Falls evaluation completed  Falls evaluation completed     Manually entered by patient   Data saved with a previous flowsheet row definition     SUMMARY AND PLAN:  Encounter for Medicare annual wellness exam   Discussed applicable health maintenance/preventive health measures and advised and referred or ordered per patient preferences: -discussed vaccines due and advised, can get at the pharmacy, advised to let us  know if does so that we can update chart Health Maintenance  Topic Date Due   DTaP/Tdap/Td (3 - Tdap) 06/10/2020   COVID-19 Vaccine (5 - 2025-26 season) 07/25/2024   Mammogram  05/09/2025   Medicare Annual Wellness (AWV)   11/10/2025   Colonoscopy  09/27/2026   Pneumococcal Vaccine: 50+ Years  Completed   Influenza Vaccine  Completed   Bone Density Scan  Completed   Zoster Vaccines- Shingrix  Completed   Meningococcal B Vaccine  Aged Out   Hepatitis C Screening  Discontinued   Fecal DNA (Cologuard)  Discontinued      Education and counseling on the following was provided based on the above review of health and a plan/checklist for the patient, along with additional information discussed, was provided for the patient in the patient instructions :  -Advised and counseled on a healthy lifestyle - including the importance of a healthy diet, regular physical activity, social connections and stress management. -Reviewed patient's current diet. Advised and counseled on a whole foods based healthy diet. A summary of a healthy diet was provided in the Patient Instructions.  -reviewed patient's current physical activity level and discussed exercise guidelines for adults. Discussed community resources and ideas for safe exercise at home to assist in meeting exercise guideline recommendations in a safe and healthy way.  -Advise yearly dental visits at minimum and regular eye exams    Follow up: see patient instructions     Patient Instructions  I really enjoyed getting to talk with you today! I am available on Tuesdays and Thursdays for virtual  visits if you have any questions or concerns, or if I can be of any further assistance.   CHECKLIST FROM ANNUAL WELLNESS VISIT:  -Follow up (please call to schedule if not scheduled after visit):   -yearly for annual wellness visit with primary care office  Here is a list of your preventive care/health maintenance measures and the plan for each if any are due:  PLAN For any measures below that may be due:    1. Can get vaccines at the pharmacy, please let us  know if you do so that we can update your chart.  Health Maintenance  Topic Date Due   DTaP/Tdap/Td (3 -  Tdap) 06/10/2020   Medicare Annual Wellness (AWV)  01/16/2024   COVID-19 Vaccine (5 - 2025-26 season) 07/25/2024   Mammogram  05/09/2025   Colonoscopy  09/27/2026   Pneumococcal Vaccine: 50+ Years  Completed   Influenza Vaccine  Completed   Bone Density Scan  Completed   Zoster Vaccines- Shingrix  Completed   Meningococcal B Vaccine  Aged Out   Hepatitis C Screening  Discontinued   Fecal DNA (Cologuard)  Discontinued    -See a dentist at least yearly  -Get your eyes checked and then per your eye specialist's recommendations  -Other issues addressed today:   -I have included below further information regarding a healthy whole foods based diet, physical activity guidelines for adults, stress management and opportunities for social connections. I hope you find this information useful.   -----------------------------------------------------------------------------------------------------------------------------------------------------------------------------------------------------------------------------------------------------------    NUTRITION: -eat real food: lots of colorful vegetables (half the plate) and fruits -5-7 servings of vegetables and fruits per day (fresh or steamed is best), exp. 2 servings of vegetables with lunch and dinner and 2 servings of fruit per day. Berries and greens such as kale and collards are great choices.  -consume on a regular basis:  fresh fruits, fresh veggies, fish, nuts, seeds, healthy oils (such as olive oil, avocado oil), whole grains (make sure for bread/pasta/crackers/etc., that the first ingredient on label contains the word whole), legumes. -can eat small amounts of dairy and lean meat (no larger than the palm of your hand), but avoid processed meats such as ham, bacon, lunch meat, etc. -drink water -try to avoid fast food and pre-packaged foods, processed meat, ultra processed foods/beverages (donuts, candy, etc.) -most experts advise  limiting sodium to < 2300mg  per day, should limit further is any chronic conditions such as high blood pressure, heart disease, diabetes, etc. The American Heart Association advised that < 1500mg  is is ideal -try to avoid foods/beverages that contain any ingredients with names you do not recognize  -try to avoid foods/beverages  with added sugar or sweeteners/sweets  -try to avoid sweet drinks (including diet drinks): soda, juice, Gatorade, sweet tea, power drinks, diet drinks -try to avoid white rice, white bread, pasta (unless whole grain)  EXERCISE GUIDELINES FOR ADULTS: -if you wish to increase your physical activity, do so gradually and with the approval of your doctor -STOP and seek medical care immediately if you have any chest pain, chest discomfort or trouble breathing when starting or increasing exercise  -move and stretch your body, legs, feet and arms when sitting for long periods -Physical activity guidelines for optimal health in adults: -get at least 150 minutes per week of moderate exercise (can talk, but not sing); this is about 20-30 minutes of sustained activity 5-7 days per week or two 10-15 minute episodes of sustained activity 5-7 days per week -do some muscle building/resistance  training/strength training at least 2 days per week  -balance exercises 3+ days per week:   Stand somewhere where you have something sturdy to hold onto if you lose balance    1) lift up on toes, then back down, start with 5x per day and work up to 20x   2) stand and lift one leg straight out to the side so that foot is a few inches of the floor, start with 5x each side and work up to 20x each side   3) stand on one foot, start with 5 seconds each side and work up to 20 seconds on each side  If you need ideas or help with getting more active:  -Silver sneakers https://tools.silversneakers.com  -Walk with a Doc: Http://www.duncan-williams.com/  -try to include resistance (weight lifting/strength  building) and balance exercises twice per week: or the following link for ideas: http://castillo-powell.com/  buyducts.dk  STRESS MANAGEMENT: -can try meditating, or just sitting quietly with deep breathing while intentionally relaxing all parts of your body for 5 minutes daily -if you need further help with stress, anxiety or depression please follow up with your primary doctor or contact the wonderful folks at [facility]: [phone]  SOCIAL CONNECTIONS: -options in [place] if you wish to engage in more social and exercise related activities:  -Silver sneakers https://tools.silversneakers.com  -Walk with a Doc: Http://www.duncan-williams.com/  -Check out the [facility] Active Adults 50+ section on the [place] [employer] companies (hiking clubs, book clubs, cards and games, chess, exercise classes, aquatic classes and much more) - see the website for details: https://www.[place].gov/departments/parks-recreation/active-adults50  -YouTube has lots of exercise videos for different ages and abilities as well  -[facility] (a variety of indoor and outdoor inperson activities for adults). [phone]. [address].  -Virtual Online Classes (a variety of topics): see seniorplanet.org or call [phone])[phone]  -consider volunteering at a school, hospice center, church, senior center or elsewhere            Chiquita JONELLE Cramp, DO      [1]  Current Outpatient Medications on File Prior to Visit  Medication Sig Dispense Refill   ALPRAZolam  (XANAX ) 0.25 MG tablet TAKE 1/2 TABLET BY MOUTH AT BEDTIME AS NEEDED FOR INSOMNIA 45 tablet 0   amLODipine  (NORVASC ) 5 MG tablet Take 1 tablet (5 mg total) by mouth daily. 90 tablet 3   CALCIUM-VITAMIN D PO Take 2 tablets by mouth daily.     citalopram  (CELEXA ) 20 MG tablet TAKE 1 TABLET BY MOUTH EVERY DAY 90 tablet 1    EPIPEN  2-PAK 0.3 MG/0.3ML SOAJ injection Inject 0.3 mg into the muscle as needed for anaphylaxis.      fluticasone  (FLONASE ) 50 MCG/ACT nasal spray Place 2 sprays into both nostrils 2 (two) times daily. 16 g 6   Fluticasone  Furoate-Vilanterol (BREO ELLIPTA IN) Inhale 1 puff into the lungs daily.     gabapentin  (NEURONTIN ) 300 MG capsule TAKE 1 CAPSULE BY MOUTH EVERYDAY AT BEDTIME 90 capsule 4   hydrochlorothiazide  (MICROZIDE ) 12.5 MG capsule Take 1 capsule (12.5 mg total) by mouth daily. 90 capsule 3   levothyroxine  (SYNTHROID ) 50 MCG tablet Take 1 tablet (50 mcg total) by mouth daily. 90 tablet 3   meloxicam  (MOBIC ) 15 MG tablet TAKE 1 TABLET BY MOUTH EVERY DAY 90 tablet 0   polyethylene glycol (MIRALAX  / GLYCOLAX ) 17 g packet Take 17 g by mouth every other day.     tamoxifen  (NOLVADEX ) 20 MG tablet TAKE 1 TABLET BY MOUTH EVERY  DAY 90 tablet 3   [DISCONTINUED] prochlorperazine  (COMPAZINE ) 10 MG tablet Take 1 tablet (10 mg total) by mouth every 6 (six) hours as needed (Nausea or vomiting). 30 tablet 1   No current facility-administered medications on file prior to visit.  [2]  Allergies Allergen Reactions   Codeine Sulfate Nausea And Vomiting   Erythromycin Base Other (See Comments)    Stomach ache   Esomeprazole Magnesium     REACTION: GI upset

## 2024-11-10 NOTE — Patient Instructions (Signed)
 I really enjoyed getting to talk with you today! I am available on Tuesdays and Thursdays for virtual visits if you have any questions or concerns, or if I can be of any further assistance.   CHECKLIST FROM ANNUAL WELLNESS VISIT:  -Follow up (please call to schedule if not scheduled after visit):   -yearly for annual wellness visit with primary care office  Here is a list of your preventive care/health maintenance measures and the plan for each if any are due:  PLAN For any measures below that may be due:    1. Can get vaccines at the pharmacy, please let us  know if you do so that we can update your chart.  Health Maintenance  Topic Date Due   DTaP/Tdap/Td (3 - Tdap) 06/10/2020   Medicare Annual Wellness (AWV)  01/16/2024   COVID-19 Vaccine (5 - 2025-26 season) 07/25/2024   Mammogram  05/09/2025   Colonoscopy  09/27/2026   Pneumococcal Vaccine: 50+ Years  Completed   Influenza Vaccine  Completed   Bone Density Scan  Completed   Zoster Vaccines- Shingrix  Completed   Meningococcal B Vaccine  Aged Out   Hepatitis C Screening  Discontinued   Fecal DNA (Cologuard)  Discontinued    -See a dentist at least yearly  -Get your eyes checked and then per your eye specialist's recommendations  -Other issues addressed today:   -I have included below further information regarding a healthy whole foods based diet, physical activity guidelines for adults, stress management and opportunities for social connections. I hope you find this information useful.   -----------------------------------------------------------------------------------------------------------------------------------------------------------------------------------------------------------------------------------------------------------    NUTRITION: -eat real food: lots of colorful vegetables (half the plate) and fruits -5-7 servings of vegetables and fruits per day (fresh or steamed is best), exp. 2 servings of  vegetables with lunch and dinner and 2 servings of fruit per day. Berries and greens such as kale and collards are great choices.  -consume on a regular basis:  fresh fruits, fresh veggies, fish, nuts, seeds, healthy oils (such as olive oil, avocado oil), whole grains (make sure for bread/pasta/crackers/etc., that the first ingredient on label contains the word whole), legumes. -can eat small amounts of dairy and lean meat (no larger than the palm of your hand), but avoid processed meats such as ham, bacon, lunch meat, etc. -drink water -try to avoid fast food and pre-packaged foods, processed meat, ultra processed foods/beverages (donuts, candy, etc.) -most experts advise limiting sodium to < 2300mg  per day, should limit further is any chronic conditions such as high blood pressure, heart disease, diabetes, etc. The American Heart Association advised that < 1500mg  is is ideal -try to avoid foods/beverages that contain any ingredients with names you do not recognize  -try to avoid foods/beverages  with added sugar or sweeteners/sweets  -try to avoid sweet drinks (including diet drinks): soda, juice, Gatorade, sweet tea, power drinks, diet drinks -try to avoid white rice, white bread, pasta (unless whole grain)  EXERCISE GUIDELINES FOR ADULTS: -if you wish to increase your physical activity, do so gradually and with the approval of your doctor -STOP and seek medical care immediately if you have any chest pain, chest discomfort or trouble breathing when starting or increasing exercise  -move and stretch your body, legs, feet and arms when sitting for long periods -Physical activity guidelines for optimal health in adults: -get at least 150 minutes per week of moderate exercise (can talk, but not sing); this is about 20-30 minutes of sustained activity 5-7 days per  week or two 10-15 minute episodes of sustained activity 5-7 days per week -do some muscle building/resistance training/strength training  at least 2 days per week  -balance exercises 3+ days per week:   Stand somewhere where you have something sturdy to hold onto if you lose balance    1) lift up on toes, then back down, start with 5x per day and work up to 20x   2) stand and lift one leg straight out to the side so that foot is a few inches of the floor, start with 5x each side and work up to 20x each side   3) stand on one foot, start with 5 seconds each side and work up to 20 seconds on each side  If you need ideas or help with getting more active:  -Silver sneakers https://tools.silversneakers.com  -Walk with a Doc: Http://www.duncan-williams.com/  -try to include resistance (weight lifting/strength building) and balance exercises twice per week: or the following link for ideas: http://castillo-powell.com/  buyducts.dk  STRESS MANAGEMENT: -can try meditating, or just sitting quietly with deep breathing while intentionally relaxing all parts of your body for 5 minutes daily -if you need further help with stress, anxiety or depression please follow up with your primary doctor or contact the wonderful folks at [facility]: [phone]  SOCIAL CONNECTIONS: -options in [place] if you wish to engage in more social and exercise related activities:  -Silver sneakers https://tools.silversneakers.com  -Walk with a Doc: Http://www.duncan-williams.com/  -Check out the [facility] Active Adults 50+ section on the [place] [employer] (hiking clubs, book clubs, cards and games, chess, exercise classes, aquatic classes and much more) - see the website for details: https://www.[place].gov/departments/parks-recreation/active-adults50  -YouTube has lots of exercise videos for different ages and abilities as well  -[facility] (a variety of indoor and outdoor inperson activities for adults). [phone]. [address]  -Virtual Online Classes (a variety of topics): see seniorplanet.org or call [phone]  -consider volunteering at a school, hospice center, church, senior center or elsewhere

## 2024-11-15 ENCOUNTER — Ambulatory Visit

## 2024-11-22 ENCOUNTER — Ambulatory Visit

## 2024-11-25 ENCOUNTER — Ambulatory Visit (HOSPITAL_BASED_OUTPATIENT_CLINIC_OR_DEPARTMENT_OTHER): Admitting: Pulmonary Disease

## 2024-11-25 ENCOUNTER — Encounter (HOSPITAL_BASED_OUTPATIENT_CLINIC_OR_DEPARTMENT_OTHER): Payer: Self-pay | Admitting: Pulmonary Disease

## 2024-11-25 VITALS — BP 129/70 | HR 64 | Temp 98.1°F | Ht 64.0 in | Wt 142.9 lb

## 2024-11-25 DIAGNOSIS — R053 Chronic cough: Secondary | ICD-10-CM

## 2024-11-25 MED ORDER — GABAPENTIN 100 MG PO CAPS: 100.0000 mg | ORAL_CAPSULE | Freq: Two times a day (BID) | ORAL | 2 refills | Status: DC

## 2024-11-25 NOTE — Patient Instructions (Signed)
 Chronic cough --ORDER pulmonary function tests. If abnormal, will increase strength of inhalers             >Hold Breo for 48-73 hours before testing --ORDER CT chest without contrast --START gabapentin  100 mg in the morning and evening. Continue gabapentin  300 mg nightly --Discussed bronchoscopy if work-up negative however this would be diagnostic and not curative --Continue flonase  twice a day

## 2024-11-25 NOTE — Progress Notes (Signed)
 "   Subjective:   PATIENT ID: Theresa Bradley GENDER: female DOB: Mar 06, 1951, MRN: [id number]  Chief Complaint  Patient presents with   Cough    Reason for Visit: New consult for cough     Theresa Bradley is a 74 y.o. female never smoker with HTN, hypothyroidism, osteoarthritis who presents for cough.  Social History: Never smoker     11/25/2024 Discussed the use of AI scribe software for clinical note transcription with the patient, who gave verbal consent to proceed.  History of Present Illness Theresa Bradley is a 74 year old female who presents with persistent cough and voice changes.  She has experienced a chronic cough since December 2024, which is sometimes productive. The cough was previously evaluated in 2015 by a pulmonologist and was thought to be related to allergic rhinitis and GERD. At that time, she stopped lisinopril and had a trial of PPI, with normal PFTs. She has tried various treatments including albuterol , Breo, Allegra, Flonase , cetirizine, and Claritin, but did not find them effective. She has been on Breo once daily for about two years and has used an emergency inhaler occasionally for severe coughing fits.  She has been seeing a speech therapist since August 2025 and reports improved voice quality. However, her voice remains affected, especially when she is sick, and she has been experiencing frequent illnesses with increased phlegm and coughing up green sputum. She was sick in January, May, late September, and again recently. She has also been seeing an ENT and had a nerve block about four weeks ago, which did not help. She is scheduled for a second attempt.  She has been on gabapentin  for hot flashes and sleep, taking it once at night, but it has not been effective for her cough. She has also been taking famotidine  for several years. Allergy shots were not helpful and have been stopped temporarily. She has a history of high eosinophil count, which was  noted to be in the 800 range.  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 She has had to leave meetings and feels self-conscious about coughing in public settings.     Past Medical History:  Diagnosis Date   Anxiety    Breast cancer (HCC) 2009   right   Breast cancer (HCC) 2021   Cancer (HCC)    COUGH, CHRONIC 05/10/2010   Dysrhythmia    occationally skips a beat   Family history of brain cancer    Family history of breast cancer    Family history of lung cancer    Family history of multiple myeloma    Family history of skin cancer    GERD (gastroesophageal reflux disease)    HYPERTENSION 04/16/2009   HYPOTHYROIDISM 04/16/2009   OSTEOARTHRITIS 04/16/2009   Personal history of chemotherapy 2021   Personal history of radiation therapy 2009   SENILE LENTIGO 06/10/2010     Family History  Problem Relation Age of Onset   Skin cancer Mother    Hyperlipidemia Father    Dementia Father    Skin cancer Father    Heart attack Father    Diabetes Brother    Hypertension Brother    Breast cancer Maternal Aunt 18   Breast cancer Maternal Aunt        dx. mid-70s   Cancer Paternal Aunt        unknown type, dx. >50   Lung cancer Paternal Uncle    Multiple  myeloma Paternal Uncle    Brain cancer Cousin        dx. 75s, paternal cousin   Arthritis Other    Hypertension Other    Cancer Other        2 aunts   Colon cancer Neg Hx    Esophageal cancer Neg Hx    Pancreatic cancer Neg Hx    Stomach cancer Neg Hx      Social History   Occupational History   Occupation: Veterinary Surgeon    Comment: Environmental Education Officer   Occupation: Wellsite Geologist: [employer]    Comment: Retired  Tobacco Use   Smoking status: Never   Smokeless tobacco: Never   Tobacco comments:    father smoked when she was younger   Advertising Account Planner   Vaping status: Never Used  Substance and Sexual Activity   Alcohol use: Yes    Comment:  occasionally    Drug use: No   Sexual activity: Not on file    Allergies[1]   Outpatient Medications Prior to Visit  Medication Sig Dispense Refill   ALPRAZolam  (XANAX ) 0.25 MG tablet TAKE 1/2 TABLET BY MOUTH AT BEDTIME AS NEEDED FOR INSOMNIA 45 tablet 0   amLODipine  (NORVASC ) 5 MG tablet Take 1 tablet (5 mg total) by mouth daily. 90 tablet 3   CALCIUM-VITAMIN D PO Take 2 tablets by mouth daily.     citalopram  (CELEXA ) 20 MG tablet TAKE 1 TABLET BY MOUTH EVERY DAY 90 tablet 1   EPIPEN  2-PAK 0.3 MG/0.3ML SOAJ injection Inject 0.3 mg into the muscle as needed for anaphylaxis.      fluticasone  (FLONASE ) 50 MCG/ACT nasal spray Place 2 sprays into both nostrils 2 (two) times daily. 16 g 6   Fluticasone  Furoate-Vilanterol (BREO ELLIPTA IN) Inhale 1 puff into the lungs daily.     gabapentin  (NEURONTIN ) 300 MG capsule TAKE 1 CAPSULE BY MOUTH EVERYDAY AT BEDTIME 90 capsule 4   hydrochlorothiazide  (MICROZIDE ) 12.5 MG capsule Take 1 capsule (12.5 mg total) by mouth daily. 90 capsule 3   levothyroxine  (SYNTHROID ) 50 MCG tablet Take 1 tablet (50 mcg total) by mouth daily. 90 tablet 3   meloxicam  (MOBIC ) 15 MG tablet TAKE 1 TABLET BY MOUTH EVERY DAY 90 tablet 0   polyethylene glycol (MIRALAX  / GLYCOLAX ) 17 g packet Take 17 g by mouth every other day.     tamoxifen  (NOLVADEX ) 20 MG tablet TAKE 1 TABLET BY MOUTH EVERY DAY 90 tablet 3   No facility-administered medications prior to visit.    Review of Systems  Constitutional:  Negative for chills, diaphoresis, fever, malaise/fatigue and weight loss.  HENT:  Positive for congestion.   Respiratory:  Positive for cough. Negative for hemoptysis, sputum production, shortness of breath and wheezing.   Cardiovascular:  Negative for chest pain, palpitations and leg swelling.     Objective:   Vitals:   11/25/24 0938  BP: 129/70  Pulse: 64  Temp: 98.1 F (36.7 C)  SpO2: 99%  Weight: 142 lb 14.4 oz (64.8 kg)  Height: 5' 4 (1.626 m)   SpO2: 99  %  Physical Exam GENERAL: Well appearing, no acute distress. HEAD EARS NOSE THROAT: Normocephalic, atraumatic. EYES: Extraocular movements intact, no scleral icterus. RESPIRATORY: Clear to auscultation bilaterally, no wheezing, no crackles, no rales. CARDIOVASCULAR: Regular rate and rhythm, no murmurs, rubs, or gallops, no jugular venous distention. EXTREMITIES: No edema, no tenderness. NEUROLOGICAL: Alert and oriented x4, cranial nerves II-XII grossly intact. PSYCHIATRIC: Normal mood, normal  affect.   Data Reviewed:  Imaging: CXR 04/12/21 - no acute infiltrate effusion or edema  PFT: 06/30/14 FVC 2.60 (85%) FEV1 2.13 (91%) Ratio 82  TLC 94% DLCO 104% Interpretation: Normal pulmonary function tests   Labs: CBC    Component Value Date/Time   WBC 6.8 05/23/2024 1222   WBC 5.5 09/18/2021 1202   RBC 4.21 05/23/2024 1222   HGB 12.5 05/23/2024 1222   HCT 37.2 05/23/2024 1222   PLT 304 05/23/2024 1222   MCV 88.4 05/23/2024 1222   MCH 29.7 05/23/2024 1222   MCHC 33.6 05/23/2024 1222   RDW 12.3 05/23/2024 1222   LYMPHSABS 1.8 05/23/2024 1222   MONOABS 0.6 05/23/2024 1222   EOSABS 0.8 (H) 05/23/2024 1222   BASOSABS 0.1 05/23/2024 1222   Absolute eos 05/23/24 - 800     Assessment & Plan:   Discussion: 74 y.o. female never smoker with HTN, hypothyroidism, osteoarthritis who presents for cough >10 years. Frequent coughing spells >4-5x this year exacerbating her chronic cough. S/p nerve block with ENT last month. Persistent cough in absence of illness. Steroids help but difficulty tolerating side effects. On low dose gabapentin  with unchanged cough. 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 If abnormal, will increase strength of inhalers  >Hold Breo for 48-73 hours before testing --ORDER CT chest without contrast --START gabapentin   100 mg in the morning and evening. Continue gabapentin  300 mg nightly --Discussed bronchoscopy if work-up negative however this would be diagnostic and not curative --Continue flonase  twice a day   Health Maintenance Immunization History  Administered Date(s) Administered   Fluad Quad(high Dose 65+) 08/15/2019, 09/20/2020, 09/03/2022   Fluad Trivalent(High Dose 65+) 09/15/2023   INFLUENZA, HIGH DOSE SEASONAL PF 10/03/2015, 08/14/2016, 12/12/2016, 09/01/2017, 09/06/2018, 01/17/2022, 09/07/2024   Influenza Split 09/08/2011, 08/20/2012, 09/26/2013   Influenza Whole 09/11/2010   Influenza,inj,Quad PF,6+ Mos 09/01/2014   Influenza,inj,quad, With Preservative 07/27/2017, 08/24/2018   Influenza-Unspecified 09/02/2021   PFIZER(Purple Top)SARS-COV-2 Vaccination 12/18/2019, 01/09/2020, 05/25/2020, 01/17/2022   Pneumococcal Conjugate-13 09/29/2014   Pneumococcal Polysaccharide-23 11/24/2006, 11/06/2015, 12/12/2016, 02/23/2018, 01/17/2022   Td 11/25/1999, 06/10/2010   Zoster Recombinant(Shingrix) 06/05/2017, 10/24/2017   Zoster, Live 07/09/2010   CT Lung Screen - never smoker  Orders Placed This Encounter  Procedures   CT Chest Wo Contrast    Standing Status:   Future    Expiration Date:   11/25/2025    Scheduling Instructions:     When next available    Preferred imaging location?:   [facility]   Pulmonary function test    Standing Status:   Future    Expiration Date:   11/25/2025    Where should this test be performed?:   Outpatient Pulmonary    What type of PFT is being ordered?:   Full PFT   Meds ordered this encounter  Medications   gabapentin  (NEURONTIN ) 100 MG capsule    Sig: Take 1 capsule (100 mg total) by mouth 2 (two) times daily. In morning and afternoon. Ok to take in addition with gabapentin  300 mg nightly    Dispense:  60 capsule    Refill:  2    Return for after PFT when next available , with Dr. Kassie.  I have spent a total time of 45-minutes on the day  of the appointment reviewing prior documentation, coordinating care and discussing medical diagnosis and plan  with the patient/family. Imaging, labs and tests included in this note have been reviewed and interpreted independently by me. This note is generated using Abridge programming. Patient/family has given consent.  Theresa Bradley Staff, MD [facility] 11/25/2024 12:07 PM        [1]  Allergies Allergen Reactions   Codeine Sulfate Nausea And Vomiting   Erythromycin Base Other (See Comments)    Stomach ache   Esomeprazole Magnesium     REACTION: GI upset   "

## 2024-11-25 NOTE — Assessment & Plan Note (Addendum)
--  ORDER pulmonary function tests. If abnormal, will increase strength of inhalers  >Hold Breo for 48-73 hours before testing --ORDER CT chest without contrast --START gabapentin  100 mg in the morning and evening. Continue gabapentin  300 mg nightly --Discussed bronchoscopy if work-up negative however this would be diagnostic and not curative --Continue flonase  twice a day

## 2024-11-28 ENCOUNTER — Encounter (INDEPENDENT_AMBULATORY_CARE_PROVIDER_SITE_OTHER): Payer: Self-pay

## 2024-11-28 ENCOUNTER — Ambulatory Visit (INDEPENDENT_AMBULATORY_CARE_PROVIDER_SITE_OTHER)

## 2024-11-28 VITALS — BP 127/63 | HR 58 | Temp 98.0°F | Wt 142.0 lb

## 2024-11-28 DIAGNOSIS — R490 Dysphonia: Secondary | ICD-10-CM | POA: Diagnosis not present

## 2024-11-28 DIAGNOSIS — K219 Gastro-esophageal reflux disease without esophagitis: Secondary | ICD-10-CM | POA: Diagnosis not present

## 2024-11-28 DIAGNOSIS — J0100 Acute maxillary sinusitis, unspecified: Secondary | ICD-10-CM | POA: Diagnosis not present

## 2024-11-28 DIAGNOSIS — R053 Chronic cough: Secondary | ICD-10-CM

## 2024-11-28 MED ORDER — AMOXICILLIN-POT CLAVULANATE 875-125 MG PO TABS: 1.0000 | ORAL_TABLET | Freq: Two times a day (BID) | ORAL | 0 refills | Status: DC

## 2024-11-28 MED ORDER — PANTOPRAZOLE SODIUM 40 MG PO TBEC: 40.0000 mg | DELAYED_RELEASE_TABLET | Freq: Two times a day (BID) | ORAL | 3 refills | Status: DC

## 2024-11-28 NOTE — Progress Notes (Addendum)
 Dear Dr. Micheal, Here is my assessment for our mutual patient, Theresa Bradley. Thank you for allowing me the opportunity to care for your patient. Please do not hesitate to contact me should you have any other questions. Sincerely, Dr. Penne Croak  Otolaryngology Clinic Note Referring provider: Dr. Micheal HPI:  Discussed the use of AI scribe software for clinical note transcription with the patient, who gave verbal consent to proceed.  History of Present Illness  Notes chronic dry/wet cough since 2013. No inciting incident. Preceded by URI. Random. Denies Dyspnea, choking, neck pain, neck swelling. A/w raspy voice  Prior: Work-up: CXR. Chest ct ordered and pending Med trials: Steroids - do help.  Flonase  - BID Gabapentin  - no change in cough, 300mg  at bedtime and added 100mg  qday  Did immunotherapy x 10 years.   No H/o of PNA, asthma, aspiration? No H/o Allergies? No dysphagia, choking with solids/softs/liquids, odynophagia or food impaction? No H/o GERD/LPR  Tried famotidine  x several years  GI - adequate trial - no Pulm - rechecking PFT  Hx of Lisinopril- stopped  Tobacco: None**?EtOH: None**?Prior H&N Surgery: None**?Occupation  [name] Theresa Bradley is a 74 year old female with chronic cough and persistent hoarseness who presents for evaluation of chronic cough.  Chronic cough - Duration over ten years, occurring daily and most pronounced in the mornings - Triggers include eating, exposure to strong odors, and occasionally occur without provocation - Frequent throat clearing and episodic expectoration of green sputum throughout the year - Cough severity increased during three episodes of illness in January, May, and September of the past year - No consistent nasal drainage except during current upper respiratory infection - Antibiotics prescribed in January were ineffective - Allergy immunotherapy discontinued after illness in May following approximately ten  years of treatment, without symptom relief - No gastrointestinal evaluation with EGD performed - Antacids and H2 blockers, including intermittent famotidine , have not improved symptoms - Adverse reaction to a prescription antacid  Hoarseness and voice changes - Persistent hoarseness throughout the year - Voice described as sounding sick by others even in the absence of systemic illness - Voice therapy provides only transient improvement - Frequent throat clearing noted by voice therapist  Acute upper respiratory symptoms - Nasal drainage and increased congestion for the past week - Occasional epistaxis, particularly with nasal dryness - Family members also recently ill  Independent Review of Additional Tests or Records:  Reviewed external note from referring PCP, [name],describing relevant history incorporated into todays evaluation.   PMH/Meds/All/SocHx/FamHx/ROS:   Past Medical History:  Diagnosis Date   Anxiety    Breast cancer (HCC) 2009   right   Breast cancer (HCC) 2021   Cancer (HCC)    COUGH, CHRONIC 05/10/2010   Dysrhythmia    occationally skips a beat   Family history of brain cancer    Family history of breast cancer    Family history of lung cancer    Family history of multiple myeloma    Family history of skin cancer    GERD (gastroesophageal reflux disease)    HYPERTENSION 04/16/2009   HYPOTHYROIDISM 04/16/2009   OSTEOARTHRITIS 04/16/2009   Personal history of chemotherapy 2021   Personal history of radiation therapy 2009   SENILE LENTIGO 06/10/2010     Past Surgical History:  Procedure Laterality Date   BREAST EXCISIONAL BIOPSY Right    BREAST EXCISIONAL BIOPSY Left    BREAST LUMPECTOMY Right 2009   radiation only   BREAST LUMPECTOMY WITH RADIOACTIVE SEED AND  SENTINEL LYMPH NODE BIOPSY Right 01/31/2020   Procedure: RIGHT BREAST LUMPECTOMY WITH RADIOACTIVE SEED AND SENTINEL LYMPH NODE BIOPSY;  Surgeon: Vanderbilt Ned, MD;  Location: [facility];  Service: General;  Laterality: Right;  PEC BLOCK   BREAST SURGERY  2009   X 2, cancer stage 0, lumpectomy   CESAREAN SECTION     1979, 1982   CHOLECYSTECTOMY     COLONOSCOPY     DILATATION & CURETTAGE/HYSTEROSCOPY WITH MYOSURE  11/2015   HERNIA REPAIR  2009   JOINT REPLACEMENT  2008   hip   MASTECTOMY Right 2021   OPEN SURGICAL REPAIR OF GLUTEAL TENDON Left 07/18/2019   Procedure: Left hip bearing surface revision with gluteal tendon repair;  Surgeon: Melodi Lerner, MD;  Location: [facility];  Service: Orthopedics;  Laterality: Left;  2 hrs   PORTACATH PLACEMENT N/A 03/15/2020   Procedure: INSERTION PORT-A-CATH WITH ULTRASOUND GUIDANCE;  Surgeon: Vanderbilt Ned, MD;  Location: [facility];  Service: General;  Laterality: N/A;   SIMPLE MASTECTOMY WITH AXILLARY SENTINEL NODE BIOPSY Right 03/15/2020   Procedure: RIGHT SIMPLE MASTECTOMY;  Surgeon: Vanderbilt Ned, MD;  [facility];  Service: General;  Laterality: Right;    Family History  Problem Relation Age of Onset   Skin cancer Mother    Hyperlipidemia Father    Dementia Father    Skin cancer Father    Heart attack Father    Diabetes Brother    Hypertension Brother    Breast cancer Maternal Aunt 52   Breast cancer Maternal Aunt        dx. mid-70s   Cancer Paternal Aunt        unknown type, dx. >50   Lung cancer Paternal Uncle    Multiple myeloma Paternal Uncle    Brain cancer Cousin        dx. 97s, paternal cousin   Arthritis Other    Hypertension Other    Cancer Other        2 aunts   Colon cancer Neg Hx    Esophageal cancer Neg Hx    Pancreatic cancer Neg Hx    Stomach cancer Neg Hx      Social Connections: Moderately Isolated (11/07/2024)   Social Connection and Isolation Panel    Frequency of Communication with Friends and Family: More than three times a week    Frequency of Social Gatherings with Friends and Family: Once a week    Attends Religious  Services: Patient declined    Database Administrator or Organizations: No    Attends Engineer, Structural: Not on file    Marital Status: Married     Current Medications[1]   Physical Exam:   BP 127/63 (BP Location: Left Arm, Patient Position: Sitting, Cuff Size: Normal)   Pulse (!) 58   Temp 98 F (36.7 C)   Wt 142 lb (64.4 kg)   SpO2 98%   BMI 24.37 kg/m   The patient was awake, alert, and appropriate. The external ears were inspected, and otoscopy was performed to evaluate the external auditory canals and tympanic membranes. The nasal cavity and septum were examined for mucosal changes, obstruction, or discharge. The oral cavity and oropharynx were inspected for mucosal lesions, infection, or tonsillar hypertrophy. The neck was palpated for lymphadenopathy, thyroid  abnormalities, or other masses. Cranial nerve function was grossly intact.  Pertinent Findings: General: Well developed, well nourished. No acute distress. Voice with hoarseness Head/Face: Normocephalic. No sinus  tenderness. Facial nerve intact and equal bilaterally. No facial lacerations. Eyes: PERRL, no scleral icterus or conjunctival hemorrhage. EOMI. Ears: No gross deformity. Normal external canal. Tympanic membrane with normal landmarks bilaterally Hearing: Normal speech reception.  Nose: see nasal endo Mouth/Oropharynx: Lips without any lesions. Dentition . No mucosal lesions within the oropharynx. No tonsillar enlargement, exudate, or lesions. Pharyngeal walls symmetrical. Uvula midline. Tongue midline without lesions. Larynx: See TFL if applicable Nasopharynx: See TFL if applicable Neck: Trachea midline. No masses. No thyromegaly or nodules palpated. No crepitus. Lymphatic: No lymphadenopathy in the neck. Respiratory: No stridor or distress. Room air. Cardiovascular: Regular rate and rhythm. Extremities: No edema or cyanosis. Warm and well-perfused. Skin: No scars or lesions on face or  neck. Neurologic: CN II-XII grossly intact. Moving all extremities without gross abnormality. Other:  Physical Exam HEENT: Nasal mucosa dry. Vocal cords erythematous. Throat otherwise normal.   Seprately Identifiable Procedures:  I personally ordered, reviewed and interpreted the following with the patient today  Procedure Note Pre-procedure diagnosis:  Dysphonia and chronic cough Post-procedure diagnosis: Same Procedure: Transnasal Fiberoptic Laryngoscopy, CPT 31575 - Mod 59 Indication: dysphonia and chronic cough Complications: None apparent EBL: 0 mL  The procedure was undertaken to further evaluate the patient's complaint of hoarseness and chronic cough, with mirror exam inadequate for appropriate examination due to gag reflex and poor patient tolerance  Procedure:  Patient was identified as correct patient. Verbal consent was obtained. The nose was sprayed with oxymetazoline and 4% lidocaine . The The flexible laryngoscope was passed through the nose to view the nasal cavity, pharynx (oropharynx, hypopharynx) and larynx.  The larynx was examined at rest and during multiple phonatory tasks. Documentation was obtained and reviewed with patient. The scope was removed. The patient tolerated the procedure well.  Findings: The nasal cavity and nasopharynx did not reveal any masses or lesions, mucosa appeared to be without obvious lesions. The tongue base, pharyngeal walls, piriform sinuses, vallecula, epiglottis and postcricoid region are normal in appearance EXCEPT: erythemaotus vocal folds with pachydermia and interarytenoid edema. The visualized portion of the subglottis and proximal trachea is widely patent. The vocal folds are mobile bilaterally. There are no lesions on the free edge of the vocal folds nor elsewhere in the larynx worrisome for malignancy.    Electronically signed by: Penne Croak, DO 11/30/2024 10:10 AM   Given the patient's symptoms and incomplete visualization of  critical sinonasal areas with anterior rhinoscopy, a separately performed diagnostic nasal endoscopy procedure is indicated for a complete rhinologic evaluation per American Rhinologic Society recommendations (https://[email or web address]-rhinologic.org/position-statements)  I personally ordered, reviewed and interpreted the following with the patient today   Procedure Note Diagnostic Nasal Endoscopy CPT CODE -- 68768 - Mod 25  Prior to initiating any procedures, risks/benefits/alternatives were explained to the patient and verbal consent obtained.  Pre-procedure diagnosis: chronic cough Post-procedure diagnosis: chronic maxillary sinusitis Indication: See pre-procedure diagnosis and physical exam above Complications: None apparent EBL: 0 mL Anesthesia: Lidocaine  4% and topical decongestant was topically sprayed in each nasal cavity  Description of Procedure:  Patient was identified. A flexible fiberoptic endoscope was utilized to evaluate the sinonasal cavities, mucosa, sinus ostia and turbinates and septum.  Overall, signs of mucosal inflammation are noted.  Also noted are purulence left middle meatus with crusting.  No mucopurulence, polyps, or masses noted.   Right Middle meatus: clear Right SE Recess: clear Left MM: purulence, crusting Left SE Recess: clear Photodocumentation was obtained.    Impression & Plans:  Andrianna Manalang is a  74 y.o. female  1. Chronic cough   2. Hoarseness   3. Laryngopharyngeal reflux (LPR)   4. Acute maxillary sinusitis, recurrence not specified    - Findings and diagnoses discussed in detail with the patient. - Risks, benefits, and alternatives were reviewed. Through shared decision making, the patient elects to proceed with below. Assessment & Plan Chronic cough with laryngeal irritation and suspected laryngopharyngeal reflux Chronic cough and hoarseness likely due to multifactorial causes, with laryngopharyngeal reflux as a probable contributor.  Complete resolution unlikely; aim for symptom improvement. - Prescribed pantoprazole  twice daily for two months. - Provided Reflux Gourmet sample and acquisition instructions. - Advised continuation of voice therapy exercises. - Plan to taper and discontinue pantoprazole  if no improvement after two months. - no improvement with SLN block - Consider referral to [facility] for specialized evaluation and management.  - Appreciate pulmonology recommendations  Acute left-sided sinusitis - also may be contributing to patients symptoms.  Acute left-sided sinusitis confirmed by purulent drainage on nasal endoscopy. - Prescribed antibiotic therapy.  - Orders placed: No orders of the defined types were placed in this encounter.  - Medications prescribed/continued/adjusted:  Meds ordered this encounter  Medications   pantoprazole  (PROTONIX ) 40 MG tablet    Sig: Take 1 tablet (40 mg total) by mouth 2 (two) times daily before a meal.    Dispense:  60 tablet    Refill:  3   amoxicillin -clavulanate (AUGMENTIN ) 875-125 MG tablet    Sig: Take 1 tablet by mouth 2 (two) times daily.    Dispense:  20 tablet    Refill:  0   - Education materials provided to the patient. - Follow up: 6 weeks. - Consider referral to [facility] for specialized evaluation and management. Patient instructed to return sooner or go to the ED if new/worsening symptoms develop.  Thank you for allowing me the opportunity to care for your patient. Please do not hesitate to contact me should you have any other questions.  Sincerely, Penne Croak, DO Otolaryngologist (ENT) [facility] Phone: [phone] Fax: [phone]       [1]  Current Outpatient Medications:    ALPRAZolam  (XANAX ) 0.25 MG tablet, TAKE 1/2 TABLET BY MOUTH AT BEDTIME AS NEEDED FOR INSOMNIA, Disp: 45 tablet, Rfl: 0   amLODipine  (NORVASC ) 5 MG tablet, Take 1 tablet (5 mg total) by mouth daily., Disp: 90 tablet, Rfl: 3    amoxicillin -clavulanate (AUGMENTIN ) 875-125 MG tablet, Take 1 tablet by mouth 2 (two) times daily., Disp: 20 tablet, Rfl: 0   CALCIUM-VITAMIN D PO, Take 2 tablets by mouth daily., Disp: , Rfl:    citalopram  (CELEXA ) 20 MG tablet, TAKE 1 TABLET BY MOUTH EVERY DAY, Disp: 90 tablet, Rfl: 1   EPIPEN  2-PAK 0.3 MG/0.3ML SOAJ injection, Inject 0.3 mg into the muscle as needed for anaphylaxis. , Disp: , Rfl:    fluticasone  (FLONASE ) 50 MCG/ACT nasal spray, Place 2 sprays into both nostrils 2 (two) times daily., Disp: 16 g, Rfl: 6   Fluticasone  Furoate-Vilanterol (BREO ELLIPTA IN), Inhale 1 puff into the lungs daily., Disp: , Rfl:    gabapentin  (NEURONTIN ) 100 MG capsule, Take 1 capsule (100 mg total) by mouth 2 (two) times daily. In morning and afternoon. Ok to take in addition with gabapentin  300 mg nightly, Disp: 60 capsule, Rfl: 2   gabapentin  (NEURONTIN ) 300 MG capsule, TAKE 1 CAPSULE BY MOUTH EVERYDAY AT BEDTIME, Disp: 90 capsule, Rfl: 4   hydrochlorothiazide  (MICROZIDE ) 12.5 MG capsule, Take  1 capsule (12.5 mg total) by mouth daily., Disp: 90 capsule, Rfl: 3   levalbuterol (XOPENEX HFA) 45 MCG/ACT inhaler, 2 puffs., Disp: , Rfl:    levothyroxine  (SYNTHROID ) 50 MCG tablet, Take 1 tablet (50 mcg total) by mouth daily., Disp: 90 tablet, Rfl: 3   meloxicam  (MOBIC ) 15 MG tablet, TAKE 1 TABLET BY MOUTH EVERY DAY, Disp: 90 tablet, Rfl: 0   pantoprazole  (PROTONIX ) 40 MG tablet, Take 1 tablet (40 mg total) by mouth 2 (two) times daily before a meal., Disp: 60 tablet, Rfl: 3   polyethylene glycol (MIRALAX  / GLYCOLAX ) 17 g packet, Take 17 g by mouth every other day., Disp: , Rfl:    tamoxifen  (NOLVADEX ) 20 MG tablet, TAKE 1 TABLET BY MOUTH EVERY DAY, Disp: 90 tablet, Rfl: 3

## 2024-12-07 ENCOUNTER — Ambulatory Visit: Attending: Otolaryngology

## 2024-12-07 ENCOUNTER — Other Ambulatory Visit

## 2024-12-07 DIAGNOSIS — E876 Hypokalemia: Secondary | ICD-10-CM

## 2024-12-07 DIAGNOSIS — R498 Other voice and resonance disorders: Secondary | ICD-10-CM | POA: Diagnosis present

## 2024-12-07 LAB — BASIC METABOLIC PANEL WITH GFR
BUN: 14 mg/dL (ref 6–23)
CO2: 32 meq/L (ref 19–32)
Calcium: 9.1 mg/dL (ref 8.4–10.5)
Chloride: 98 meq/L (ref 96–112)
Creatinine, Ser: 1.21 mg/dL — ABNORMAL HIGH (ref 0.40–1.20)
GFR: 43.86 mL/min — ABNORMAL LOW
Glucose, Bld: 92 mg/dL (ref 70–99)
Potassium: 3.3 meq/L — ABNORMAL LOW (ref 3.5–5.1)
Sodium: 137 meq/L (ref 135–145)

## 2024-12-07 NOTE — Patient Instructions (Addendum)
" ° ° °  [facility] [place] - telehealth  [email or web address]  [phone]  [email or web address] "

## 2024-12-07 NOTE — Therapy (Signed)
 " OUTPATIENT SPEECH LANGUAGE PATHOLOGY VOICE TREATMENT-RECERT/DISCHARGE   Patient Name: Theresa Bradley MRN: [id number] DOB:Apr 11, 1951, 74 y.o., female Today's Date: 12/07/2024  PCP: Micheal Pin, MD REFERRING PROVIDER: Okey Burns, MD, doc to Dr. Julious Malkin, DO  END OF SESSION:  End of Session - 12/07/24 1510     Visit Number 14    Number of Visits 18    Date for Recertification  12/07/24    SLP Start Time 1407    SLP Stop Time  1445    SLP Time Calculation (min) 38 min    Activity Tolerance Patient tolerated treatment well                 Past Medical History:  Diagnosis Date   Anxiety    Breast cancer (HCC) 2009   right   Breast cancer (HCC) 2021   Cancer (HCC)    COUGH, CHRONIC 05/10/2010   Dysrhythmia    occationally skips a beat   Family history of brain cancer    Family history of breast cancer    Family history of lung cancer    Family history of multiple myeloma    Family history of skin cancer    GERD (gastroesophageal reflux disease)    HYPERTENSION 04/16/2009   HYPOTHYROIDISM 04/16/2009   OSTEOARTHRITIS 04/16/2009   Personal history of chemotherapy 2021   Personal history of radiation therapy 2009   SENILE LENTIGO 06/10/2010   Past Surgical History:  Procedure Laterality Date   BREAST EXCISIONAL BIOPSY Right    BREAST EXCISIONAL BIOPSY Left    BREAST LUMPECTOMY Right 2009   radiation only   BREAST LUMPECTOMY WITH RADIOACTIVE SEED AND SENTINEL LYMPH NODE BIOPSY Right 01/31/2020   Procedure: RIGHT BREAST LUMPECTOMY WITH RADIOACTIVE SEED AND SENTINEL LYMPH NODE BIOPSY;  Surgeon: Vanderbilt Ned, MD;  Location: [facility];  Service: General;  Laterality: Right;  PEC BLOCK   BREAST SURGERY  2009   X 2, cancer stage 0, lumpectomy   CESAREAN SECTION     1979, 1982   CHOLECYSTECTOMY     COLONOSCOPY     DILATATION & CURETTAGE/HYSTEROSCOPY WITH MYOSURE  11/2015   HERNIA REPAIR  2009   JOINT REPLACEMENT  2008   hip    MASTECTOMY Right 2021   OPEN SURGICAL REPAIR OF GLUTEAL TENDON Left 07/18/2019   Procedure: Left hip bearing surface revision with gluteal tendon repair;  Surgeon: Melodi Lerner, MD;  Location: [facility];  Service: Orthopedics;  Laterality: Left;  2 hrs   PORTACATH PLACEMENT N/A 03/15/2020   Procedure: INSERTION PORT-A-CATH WITH ULTRASOUND GUIDANCE;  Surgeon: Vanderbilt Ned, MD;  Location: [facility];  Service: General;  Laterality: N/A;   SIMPLE MASTECTOMY WITH AXILLARY SENTINEL NODE BIOPSY Right 03/15/2020   Procedure: RIGHT SIMPLE MASTECTOMY;  Surgeon: Vanderbilt Ned, MD;  Location: [facility];  Service: General;  Laterality: Right;   Patient Active Problem List   Diagnosis Date Noted   Osteopenia 10/05/2024   Port-A-Cath in place 05/15/2020   Genetic testing 02/03/2020   Family history of breast cancer    Family history of skin cancer    Family history of brain cancer    Family history of lung cancer    Family history of multiple myeloma    Malignant neoplasm of upper-outer quadrant of right breast in female, estrogen receptor positive (HCC) 01/04/2020   Melanoma of skin (HCC) 08/15/2019   Failed total hip arthroplasty, 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  Theresa Bradley was seen for 14 sessions focusing on eliminating vocal strain/laryngeal compression, vocal fold atrophy, and chronic cough. See below for goal update.   Remaining deficits: Intermittent hoarseness; cont'd sx chronic cough   Education / Equipment: See individual therapy  session notes for details.    Patient agrees to discharge. Patient goals were partially met. Patient is being discharged due to pt's request to try SLP trained strategies and techniques on her own, as well as other pulmonary testing and CT chest in the near future. SLP suggested she consider other therapy options in the future such as [facility], or [facility].    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  Rationale for Evaluation and Treatment: Rehabilitation  SUBJECTIVE:   SUBJECTIVE STATEMENT: Pt arrives with largely WNL voice quality today.     Pt accompanied by: self  PERTINENT HISTORY:  From [place]'s note 06/17/24: Theresa Bradley is a 74 year old female with chronic cough who presents with persistent cough and voice changes. She was referred by Dr. Fleeta Smock Allergies for evaluation of her chronic cough for several years and voice changes/dysphonia for several months   She has experienced a chronic cough for many years, which worsened after an illness in December 2024. She also experienced persistent hoarseness at the same time, and has had it since. The cough is associated with phlegm production, sometimes green in color, and occasional throat discomfort. A chest x-ray performed in May was normal. She has a history of asthma and has been prescribed an emergency inhaler, which she has not needed to use in the past month and a half. During her worst symptoms, she coughed up significant amounts of phlegm, which has since decreased but still occurs. She occasionally experiences throat pain, described as being lower in the throat, and has had episodes of difficulty swallowing, though these are not frequent.   Her  voice has changed since December. She has a history of allergies and has been receiving allergy shots for years, though she stopped in May due to illness and antibiotic use. She has tried various antihistamines, including Zyrtec and Claritin, without significant relief. She performs daily nasal saline rinses and has a bottle of Flonase .    In the past, she consulted a pulmonologist who diagnosed her with a cyclical cough and provided treatment, but the cough returned. This was approximately seven or eight years ago.  PAIN:  Are you having pain? Yes: NPRS scale: 1/10 Pain location: 1/10 Pain description: tingling, ache Aggravating factors: walking on hard floors Relieving factors: nothing  FALLS: Has patient fallen in last 6 months? No  PATIENT GOALS: Address voice concerns  OBJECTIVE:  Note: Objective measures were completed at Evaluation unless otherwise noted.  DIAGNOSTIC FINDINGS:  Theresa Bradley 11/28/24: Impression & Plans:  Theresa Bradley is a 74 y.o. female  1. Chronic cough   2. Hoarseness   3. Laryngopharyngeal reflux (LPR)  4. Acute maxillary sinusitis, recurrence not specified     - Findings and diagnoses discussed in detail with the patient. - Risks, benefits, and alternatives were reviewed. Through shared decision making, the patient elects to proceed with below. Assessment & Plan Chronic cough with laryngeal irritation and suspected laryngopharyngeal reflux Chronic cough and hoarseness likely due to multifactorial causes, with laryngopharyngeal reflux as a probable contributor. Complete resolution unlikely; aim for symptom improvement. - Prescribed pantoprazole  twice daily for two months. - Provided Reflux Gourmet sample and acquisition instructions. - Advised continuation of voice therapy exercises. - Plan to taper and discontinue pantoprazole  if no improvement after two months. - no improvement with SLN block - Consider referral to [facility] for specialized  evaluation and management.  - Appreciate pulmonology recommendations   Acute left-sided sinusitis - also may be contributing to patients symptoms.  Acute left-sided sinusitis confirmed by purulent drainage on nasal endoscopy. - Prescribed antibiotic therapy.  Dharap 09/22/24: The base of tongue was visualized and no mass, ulceration or lesion was appreciated. The epiglottis did not demonstrate any mass, ulceration or lesion. The vallecula was also assessed with no mass, ulceration or lesion. The patient had good glottic closure upon phonation and no signs of aspiration or pooling of secretions. The patient was asked to inspire and expire with the true vocal folds vibrating normally and without evidence of vocal fold dysfunction. Bilateral true vocal cords were noted to have mild atrophy. The true and false vocal cords, interarytenoid, AE folds, and arytenoids did not demonstrate any significant edema or erythema. The patient was then asked to valsalva, and the pyriform sinuses were assessed which were unremarkable. The airway was patent and there was no evidence of compromise. The scope was then slowly withdrawn from the patient. The patient tolerated the procedure well and there were no complications.  Disposition: Stable   Impression & Plans: Assessment and Plan Assessment & Plan Chronic cough  - Refer to pulmonology for pulmonary function test to assess lung function and rule out residual lung issues. - Consider superior laryngeal nerve block if pulmonary evaluation is unremarkable.   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 Considering vocal cord filler injections - Consider referral to [facility] for specialized evaluation and management.   Chronic postnasal drip with thick mucus Chronic postnasal drip with  thick mucus, partially responsive to current antihistamine and nasal spray regimen. - Encourage use of humidifiers to help thin mucus.  [place] 06/17/24: Procedure: Preoperative diagnosis: hoarseness   Postoperative diagnosis:   same VF atrophy and glottic insufficiency   Procedure: Flexible fiberoptic laryngoscopy with stroboscopy (68420)    Surgeon: Theresa Larry, MD   Anesthesia: Topical lidocaine  and Afrin   Complications: None   Condition is stable throughout exam   Indications and consent:   The patient presents to the clinic with hoarseness. All the risks, benefits, and potential complications were reviewed with the patient preoperatively and informed verbal consent was obtained.   Procedure: The patient was seated upright in the exam chair.   Topical lidocaine  and Afrin were applied to the nasal cavity. After adequate anesthesia had occurred, the flexible telescope with strobe capabilities was passed into the nasal cavity. The nasopharynx was patent without mass or lesion. The scope was passed behind the soft palate and directed toward the base of tongue. The base of tongue was visualized and was symmetric  with no apparent masses or abnormal appearing tissue. There were no signs of a mass or pooling of secretions in the piriform sinuses. The supraglottic structures were normal.   The true vocal cords are mobile. The medial edges were straight. Closure was complete but with severe supraglottic compression. Periodicity present. The mucosal wave and amplitude were slightly decreased 2/2 straight and supraglottic compression. There is moder ate interarytenoid pachydermia and post cricoid edema. The mucosa appears without lesions.   The laryngoscope was then slowly withdrawn and the patient tolerated the procedure well. There were no complications or blood loss.  Assessment & Plan Chronic cough We discussed common causes of chronic cough and neurogenic cough. Previous treatments for  post-nasal drainage and reflux were ineffective. Was seen by Pulm before and diagnosed with cyclical cough. Normal chest x-ray 03/2024. - Review superior laryngeal nerve block information. - Xyzal  5 mg daily and Flonase , and nasal rinses daily - Consider superior laryngeal nerve block if symptoms persist.   Environmental allergies and Post-nasal drainage  Postnasal drip contributing to chronic cough. Previous treatments ineffective. - Continue nasal saline rinses. - Start Flonase  nasal spray BID - Prescribed Xyzal . - continue allergy shots    Dysphonia Hoarseness x 7 months Strobe today with supraglottic compression, VF atrophy and glottic insufficiency. We discussed a trial of speech therapy - Refer to speech therapy.   RTC 3 months   PATIENT REPORTED OUTCOME MEASURES (PROM): [place] Cough Index: Scored herself 63/133, with lower scores indicating worse QoL and negative affects from pt's cough.                                                                                                                            TREATMENT DATE:  AB= Abdominal breathing; SOVTE=Semi-occluded vocal tract exercises, VFE=Vocal function exercises; Resonant Voice Therapy = RVT  12/07/24: Today pt's voice appears WNL 85-90% of the time - I don't have all that chest congestion I've had the last couple months anymore. Clearing throat last 15 minutes of session (x14) but minimal prior to that time, coughed first 10 minutes x4. SLP reviewed cough suppression/laryngeal control strategies with her, and that she no longer needed to perform straw phonation due to voice WNL quality. No PhoRTE necessary at this time due to WNL quality. She would like to have some time to work on strategies and techniques trained by SLP, see if incr of Gabapentin  is helpful, and do further pulmonary testing SLP suggested [facility], or [facility] as steps from here. Pt stated she would choose not to drive to [facility] for ST if she  could avoid it. Theresa Bradley used AB 85% of conversation today.   11/02/24: ENT with nerve block on 10/27/24. Pt reports very little/no change. Voice sounds most WNL as SLP has heard Theresa Bradley's voice. She has practiced with RVT tasks 2-3 minutes at various times throughout the day since last session. Today SLP reviewed laryngeal control strategies as well as visualizations to use with chronic cough  and VCD s/sx. Pt to receive additional injection 11/28/24 - pulmonology was rescheduled to 11/25/24. She would like to schedule her next appointment after these appointments. SLP agrees. 26 throat clears this session; notably less than last 2 sessions.    10/19/24: Nerve block scheduled for 10/26/24. Resonant voice Therapy (RVT) targeted with /m/ and /b/ laden words. Pt with WNL voicing 65% of the time, instigated coughing after 3-4 minutes of practice - pt stated a feeling in her chest. Theresa Bradley cont to clear throat gently and sip water throughout session. SLP heard rare congestion in her throat throughout session. Pt with s/sx VCD (the tickle in her throat) throughout all exercises today. SLP told pt to cont to practice with AB, and straw phonation exercises (SOVTE).  10/04/24: Reports that she feels throat clear is still productive. Did inquire to have appointment for nerve block injection following her pulmonologist appointment. Genetle throat clears x19 today. Coughing spell near end of session when SLP attempting to perform PhoRTE with pt at WNL volume level. Pt stated this cough feeling came from her upper chest (touched sternum), and not her throat. She has had a feeling like she needed to cough come from her throat but that has not occurred for a while. Pt noted to breath with AB. Will cont to complete straw exercises. SLP thinks pt is almost where she was with WNL voice and relaxed nature of voice prior to her latest illness. SLP to consider PhoRTE next session.   09/27/24: Pt was scheduled for ST in previous weeks  but was ill, than last week got the time wrong in her planner. Pt enters with vocal quality notably worse than previous session. Extended discussion with pt about how her atrophy and supraglottic compression, and chronic cough  and VCD sx are separate vocal issues. Pt feels she was having more improvement with vocal quality prior to latest URI/cold sx. Today SLP heard pt quietly throat clear 16 times and regular throat clear 3x during session. SLP used SOVTE task of straw phonation with pt to reduce laryngeal tension and encourage coordinated production of the respiratory and phonatory systems. Today, pt independent with production and after 20-30 seconds of using the straw, her voice improved to 7-8/10 quality (10=WNL) for approx 20 seconds in response to SLP questions. SLP told pt to cont to work with straw phonation at home with use of AB.   08/11/24: Pt stated she used her straw phonation prior to talking with her brother, and her mother yesterday and heard a WNL voice for 3-5 minutes afterwards. Today she used straw phonation and did demonstrate WNL voice afterwards for approx 2-3 minutes. When pt noticed harsh/hoarse voice she used straw again and this cycle continued. As the session progressed however, the WNL voicing times decr'd in length, maybe due to fatigue. SLP again explained pt also has vocal fold atrophy which might lead to fatigue quicker than if she did not have atrophy. Pt will use straw throughout the day to foster WNL voicing.  08/08/24: Cough spell lasting 3-4 minutes as Theresa Bradley entered lobby. Entered clearing throat - total today 5- were very mildly harsh - none were harsh; Pt tapped vocal folds as a throat clear alternative 15 times and drank water. Pt with little talking today - total 15 minutes. SLP assisted pt with resonant voicing and SOVTE with straw phonation, whooo, and whooooos ______, vocal glides, and AB. Pt has been practicing these at home; Each time pt was able to change  her voicing  to WNL quality. She also accomplished this today during session x3 after 2-3 minutes of vocal glides with straw and without straw. SLP reiterated to pt why practice is important with this on a daily basis. Pt to cont this work at home.  08/04/24: Works on AB when trying to go to sleep at night. Today SLP worked with pt with flow phonation and SOVTE tasks; The most success with decr'ing voice quality common with supraglottic compression was straw phonation from high pitch to low pitch as well as high to low pitch whooo's _______? Sheron was also successful to bring pt's speech to more WNL sounding. Homework to use straw high to low pitch, and yawn-sigh for more WNL voicing. SLP used voice rest to mitigate effect of pt's chronic cough which is exacerbated by voice use.   07/28/24: After flow phonation practice last night pt stated she heard her WNL voice for 1 or 2 sentences afterwards. SLP ascertained pt also has sx of hypersensitivity to scent - pt has coughing with strong scents. Mild cough during Pt coughed last night during a meeting where she had to leave the meeting room. SLP had pt practice all chronic cough breathing strategies and stressed AB and sip/swallow to use as well. SLP guided pt through flow phonation practice and pt began coughing within 2 minutes of practice. Pt reported she coughed within 5 minutes of practice with flow phonation since last session but has cont'd to practice with this despite that. Today WNL voice was heard after 4 reps of moo using tissue, preceeded by 8 reps of ooooo with tissue. Then pt began coughing.   07/26/24: SLP provided pt another copy of cough index as she misplaced the first. Score as above. AB at rest for 5 minutes - 90% success. Meds for congestion helping with congested cough - no longer exists. Voice does not sound as strong - pt states her husband is asking her to repeat more in last 1-2 weeks.  SLP used flow phonation techniques and SOVTE  to normalize pt's vocal quality. Blowing with tissue used as visual support - pt with 100% success, with vocalization and exhalation initial 0% success but pt became more successful as SLP continued cueing and demonstrating. SLP added /m/, /s/, and sh in front of oo for feeling of flow phonation. Pt's first sentence after practicing flow phonation was WNL voicing, but then reverted back to that indicative of compression after one sentence. Pt to practice flow phonation tasks covered in ST today for 15 minutes multiple times/day. AFTER 25 MINUTES OF FLOW PHONATION PRACTICE PT EXPERIENCED COUGH SPELL for approx 4 minutes. She used box breathing and pursed lip inhale/exhale for success in stopping sx.  07/20/24: Pt to bring cough index next session. WNL voice quality heard approx 60% today. Pt has cut all caff coffee except one cup with a little decaf in AM. Finds it hard to stop coughing as cough sneaks up on her -tried to use sniff/blow technique. Flonase  has dried up some of the mucous. SLP reviewed procedure for all chronic cough rescue breathing strategies and pt completing each incorrectly. SLP corrected pt's productions and pt was independent.  Pt admitted she has to think about which strategy to use when she starts coughing instead of spontaneously starting one. SLP told pt to cont to practice strategies 20x each/daily to make this easier when she has an episode.   07/13/24: SLP worked with pt today with SOVTE and flow phonation exercises to reduce tension/compression when vocalizing.  SLP used /m/, /n/, /u/, and /o/ words and pt initially with 30% improving to 85% success with WNL voicing with pitch variations. Still rough sounding in lower pitches - possibly due to vocal fold atrophy? Pt will practice phrases  at home as in pt instructions.   06/30/24: SLP educated pt on throat clear alternatives; pt began using water sips after two additional throat clears. SLP guided pt through strategies for chronic  cough. SLP then started AB practice, which triggered chronic cough. SLP then guided pt through strategies for chronic cough in the moment, which quelled cough in 90 seconds. Homework to work on AB and to practice chronic cough strategies when not in coughing spell.   06/27/24: Not charged due to not authorized by insurance. SLP educated pt about AB and watched pt as she attempted AB. At rest pt began at 50% success but with occasional min A she incr'd success to 80%. She was told to practice this at home 10-15 minutes BID.  PATIENT EDUCATION: Education details: see treatment date this date, above Person educated: Patient Education method: Programmer, Multimedia, Demonstration, and Verbal cues Education comprehension: verbalized understanding, returned demonstration, verbal cues required, and needs further education  HOME EXERCISE PROGRAM: AB, SOVTE  GOALS: Goals reviewed with patient? Yes  SHORT TERM GOALS: Target date: 07/29/24  Pt will complete AB at rest with 85% success for 5 minutes, in 2 sessions Baseline: 07/26/24 Goal status: partially met  2.  Pt will complete AB in at least 17/20 sentence responses, in two sessions Baseline:  Goal status: not met - focus on compression  3.  To decr laryngeal compression, pt will complete SOVTE tasks in session with 90% success in 2 sessions Baseline: 07/28/24 Goal status: met   LONG TERM GOALS: Target date:  11/18/24  Pt will improve PROM from initial administration Baseline:  Goal status: pt left prior to adminstration  2.  Pt will reduce throat clears to <3 in 3 sessions, using compensations PRN Baseline:  Goal status: not met  3.  Pt will exhibit WNL voice quality for 1 minute following SOVTE or other voice exercises in 2 sessions Baseline: 11/02/24 Goal status: met  4.  Pt will exhibit WNL voice quality for 5 minutes following SOVTE or other voice exercises in 3 sessions Baseline: 12/07/24 Goal status: partially met  5.  Pt will report  being able to talk on the phone for 30 minutes without coughing episodes, between 2 sessions, using compensations PRN  Baseline:  Goal status: not met   ASSESSMENT:  CLINICAL IMPRESSION: Pt voice quality WNL today, she will be seen today and then d/c. See above this date in treatment date for explanation. Patient is a 74 y.o. F who was seen today for treatment of voice.  OBJECTIVE IMPAIRMENTS: include voice disorder. These impairments are limiting patient from managing appointments, household responsibilities, ADLs/IADLs, and effectively communicating at home and in community. Factors affecting potential to achieve goals and functional outcome are severity of impairments.. Patient will benefit from skilled SLP services to address above impairments and improve overall function.  REHAB POTENTIAL: Good  PLAN:  discharge today  PLANNED INTERVENTIONS: Internal/external aids, Functional tasks, SLP instruction and feedback, Compensatory strategies, Patient/family education, [phone] Treatment of speech (30 or 45 min) , and voice exercises    Axcel Horsch, CCC-SLP 12/07/2024, 3:15 PM      "

## 2024-12-09 MED ORDER — POTASSIUM CHLORIDE ER 10 MEQ PO TBCR: EXTENDED_RELEASE_TABLET | ORAL | 5 refills | Status: AC

## 2024-12-12 ENCOUNTER — Ambulatory Visit (HOSPITAL_BASED_OUTPATIENT_CLINIC_OR_DEPARTMENT_OTHER)
Admission: RE | Admit: 2024-12-12 | Discharge: 2024-12-12 | Disposition: A | Discharge status: Home / Self Care | Source: Ambulatory Visit | Attending: Pulmonary Disease | Admitting: Pulmonary Disease

## 2024-12-12 DIAGNOSIS — R053 Chronic cough: Secondary | ICD-10-CM | POA: Insufficient documentation

## 2024-12-13 ENCOUNTER — Encounter (HOSPITAL_BASED_OUTPATIENT_CLINIC_OR_DEPARTMENT_OTHER): Payer: Self-pay | Admitting: Pulmonary Disease

## 2024-12-13 NOTE — Telephone Encounter (Signed)
 Please advise does pt need OV with [facility]

## 2024-12-14 ENCOUNTER — Emergency Department (HOSPITAL_BASED_OUTPATIENT_CLINIC_OR_DEPARTMENT_OTHER)

## 2024-12-14 ENCOUNTER — Other Ambulatory Visit: Payer: Self-pay

## 2024-12-14 ENCOUNTER — Inpatient Hospital Stay (HOSPITAL_BASED_OUTPATIENT_CLINIC_OR_DEPARTMENT_OTHER)
Admission: EM | Admit: 2024-12-14 | Discharge: 2024-12-16 | DRG: 189 | Disposition: A | Discharge status: Home / Self Care | Source: Ambulatory Visit | Attending: Family Medicine | Admitting: Family Medicine

## 2024-12-14 ENCOUNTER — Ambulatory Visit: Payer: Self-pay

## 2024-12-14 ENCOUNTER — Other Ambulatory Visit: Payer: Self-pay | Admitting: Family Medicine

## 2024-12-14 DIAGNOSIS — Z888 Allergy status to other drugs, medicaments and biological substances status: Secondary | ICD-10-CM

## 2024-12-14 DIAGNOSIS — Z885 Allergy status to narcotic agent status: Secondary | ICD-10-CM

## 2024-12-14 DIAGNOSIS — Z9011 Acquired absence of right breast and nipple: Secondary | ICD-10-CM

## 2024-12-14 DIAGNOSIS — Z1152 Encounter for screening for COVID-19: Secondary | ICD-10-CM

## 2024-12-14 DIAGNOSIS — Z9049 Acquired absence of other specified parts of digestive tract: Secondary | ICD-10-CM

## 2024-12-14 DIAGNOSIS — Z833 Family history of diabetes mellitus: Secondary | ICD-10-CM

## 2024-12-14 DIAGNOSIS — Z7951 Long term (current) use of inhaled steroids: Secondary | ICD-10-CM

## 2024-12-14 DIAGNOSIS — T17590A Other foreign object in bronchus causing asphyxiation, initial encounter: Secondary | ICD-10-CM | POA: Diagnosis present

## 2024-12-14 DIAGNOSIS — Z8249 Family history of ischemic heart disease and other diseases of the circulatory system: Secondary | ICD-10-CM

## 2024-12-14 DIAGNOSIS — T17500A Unspecified foreign body in bronchus causing asphyxiation, initial encounter: Secondary | ICD-10-CM

## 2024-12-14 DIAGNOSIS — Z79890 Hormone replacement therapy (postmenopausal): Secondary | ICD-10-CM

## 2024-12-14 DIAGNOSIS — F39 Unspecified mood [affective] disorder: Secondary | ICD-10-CM | POA: Diagnosis present

## 2024-12-14 DIAGNOSIS — Z79899 Other long term (current) drug therapy: Secondary | ICD-10-CM

## 2024-12-14 DIAGNOSIS — Z881 Allergy status to other antibiotic agents status: Secondary | ICD-10-CM

## 2024-12-14 DIAGNOSIS — R918 Other nonspecific abnormal finding of lung field: Secondary | ICD-10-CM | POA: Diagnosis present

## 2024-12-14 DIAGNOSIS — C50911 Malignant neoplasm of unspecified site of right female breast: Secondary | ICD-10-CM | POA: Diagnosis present

## 2024-12-14 DIAGNOSIS — Z801 Family history of malignant neoplasm of trachea, bronchus and lung: Secondary | ICD-10-CM

## 2024-12-14 DIAGNOSIS — Z79810 Long term (current) use of selective estrogen receptor modulators (SERMs): Secondary | ICD-10-CM

## 2024-12-14 DIAGNOSIS — J9601 Acute respiratory failure with hypoxia: Principal | ICD-10-CM | POA: Diagnosis present

## 2024-12-14 DIAGNOSIS — Z9221 Personal history of antineoplastic chemotherapy: Secondary | ICD-10-CM

## 2024-12-14 DIAGNOSIS — E039 Hypothyroidism, unspecified: Secondary | ICD-10-CM | POA: Diagnosis present

## 2024-12-14 DIAGNOSIS — Z803 Family history of malignant neoplasm of breast: Secondary | ICD-10-CM

## 2024-12-14 DIAGNOSIS — K219 Gastro-esophageal reflux disease without esophagitis: Secondary | ICD-10-CM | POA: Diagnosis present

## 2024-12-14 DIAGNOSIS — I10 Essential (primary) hypertension: Secondary | ICD-10-CM | POA: Diagnosis present

## 2024-12-14 DIAGNOSIS — Z923 Personal history of irradiation: Secondary | ICD-10-CM

## 2024-12-14 LAB — BASIC METABOLIC PANEL WITH GFR
Anion gap: 12 (ref 5–15)
BUN: 10 mg/dL (ref 8–23)
CO2: 27 mmol/L (ref 22–32)
Calcium: 9.6 mg/dL (ref 8.9–10.3)
Chloride: 98 mmol/L (ref 98–111)
Creatinine, Ser: 1.13 mg/dL — ABNORMAL HIGH (ref 0.44–1.00)
GFR, Estimated: 50 mL/min — ABNORMAL LOW
Glucose, Bld: 165 mg/dL — ABNORMAL HIGH (ref 70–99)
Potassium: 3.1 mmol/L — ABNORMAL LOW (ref 3.5–5.1)
Sodium: 138 mmol/L (ref 135–145)

## 2024-12-14 LAB — CBC WITH DIFFERENTIAL/PLATELET
Abs Immature Granulocytes: 0.02 K/uL (ref 0.00–0.07)
Basophils Absolute: 0.1 K/uL (ref 0.0–0.1)
Basophils Relative: 1 %
Eosinophils Absolute: 1.3 K/uL — ABNORMAL HIGH (ref 0.0–0.5)
Eosinophils Relative: 14 %
HCT: 37.2 % (ref 36.0–46.0)
Hemoglobin: 12.6 g/dL (ref 12.0–15.0)
Immature Granulocytes: 0 %
Lymphocytes Relative: 42 %
Lymphs Abs: 3.8 K/uL (ref 0.7–4.0)
MCH: 30.3 pg (ref 26.0–34.0)
MCHC: 33.9 g/dL (ref 30.0–36.0)
MCV: 89.4 fL (ref 80.0–100.0)
Monocytes Absolute: 0.6 K/uL (ref 0.1–1.0)
Monocytes Relative: 7 %
Neutro Abs: 3.3 K/uL (ref 1.7–7.7)
Neutrophils Relative %: 36 %
Platelets: 293 K/uL (ref 150–400)
RBC: 4.16 MIL/uL (ref 3.87–5.11)
RDW: 12.5 % (ref 11.5–15.5)
WBC: 9.1 K/uL (ref 4.0–10.5)
nRBC: 0 % (ref 0.0–0.2)

## 2024-12-14 LAB — MAGNESIUM: Magnesium: 1.9 mg/dL (ref 1.7–2.4)

## 2024-12-14 LAB — RESP PANEL BY RT-PCR (RSV, FLU A&B, COVID)  RVPGX2
Influenza A by PCR: NEGATIVE
Influenza B by PCR: NEGATIVE
Resp Syncytial Virus by PCR: NEGATIVE
SARS Coronavirus 2 by RT PCR: NEGATIVE

## 2024-12-14 LAB — TROPONIN T, HIGH SENSITIVITY
Troponin T High Sensitivity: 8 ng/L (ref 0–19)
Troponin T High Sensitivity: 9 ng/L (ref 0–19)

## 2024-12-14 MED ORDER — IPRATROPIUM-ALBUTEROL 0.5-2.5 (3) MG/3ML IN SOLN
3.0000 mL | Freq: Once | RESPIRATORY_TRACT | Status: AC
  Administered 2024-12-14: 3 mL via RESPIRATORY_TRACT
  Filled 2024-12-14: qty 3

## 2024-12-14 MED ORDER — ALPRAZOLAM 0.5 MG PO TABS
0.2500 mg | ORAL_TABLET | Freq: Every evening | ORAL | Status: DC | PRN
  Administered 2024-12-14 – 2024-12-15 (×2): 0.25 mg via ORAL
  Filled 2024-12-14 (×2): qty 1

## 2024-12-14 MED ORDER — IOHEXOL 350 MG/ML SOLN
75.0000 mL | Freq: Once | INTRAVENOUS | Status: AC | PRN
  Administered 2024-12-14: 100 mL via INTRAVENOUS

## 2024-12-14 MED ORDER — LEVOTHYROXINE SODIUM 50 MCG PO TABS
50.0000 ug | ORAL_TABLET | Freq: Every day | ORAL | Status: DC
  Administered 2024-12-14 – 2024-12-16 (×2): 50 ug via ORAL
  Filled 2024-12-14: qty 1
  Filled 2024-12-14 (×2): qty 2

## 2024-12-14 MED ORDER — ALPRAZOLAM 0.25 MG PO TABS: ORAL_TABLET | ORAL | 1 refills | Status: DC

## 2024-12-14 MED ORDER — IPRATROPIUM-ALBUTEROL 0.5-2.5 (3) MG/3ML IN SOLN: 3.0000 mL | Freq: Once | RESPIRATORY_TRACT | Status: AC

## 2024-12-14 MED ORDER — METHYLPREDNISOLONE SODIUM SUCC 125 MG IJ SOLR
125.0000 mg | Freq: Once | INTRAMUSCULAR | Status: AC
  Administered 2024-12-14: 125 mg via INTRAVENOUS
  Filled 2024-12-14: qty 2

## 2024-12-14 MED ORDER — ALBUTEROL SULFATE (2.5 MG/3ML) 0.083% IN NEBU: 5.0000 mg | INHALATION_SOLUTION | Freq: Once | RESPIRATORY_TRACT | Status: AC

## 2024-12-14 MED ORDER — ALBUTEROL SULFATE (2.5 MG/3ML) 0.083% IN NEBU
INHALATION_SOLUTION | RESPIRATORY_TRACT | Status: AC
  Administered 2024-12-14: 2.5 mg via RESPIRATORY_TRACT
  Filled 2024-12-14: qty 6

## 2024-12-14 MED ORDER — IPRATROPIUM-ALBUTEROL 0.5-2.5 (3) MG/3ML IN SOLN
RESPIRATORY_TRACT | Status: AC
  Administered 2024-12-14: 3 mL via RESPIRATORY_TRACT
  Filled 2024-12-14: qty 3

## 2024-12-14 MED ORDER — GABAPENTIN 300 MG PO CAPS
300.0000 mg | ORAL_CAPSULE | Freq: Every day | ORAL | Status: DC
  Administered 2024-12-14 – 2024-12-15 (×2): 300 mg via ORAL
  Filled 2024-12-14 (×2): qty 1

## 2024-12-14 MED ORDER — POTASSIUM CHLORIDE CRYS ER 20 MEQ PO TBCR
40.0000 meq | EXTENDED_RELEASE_TABLET | Freq: Once | ORAL | Status: AC
  Administered 2024-12-14: 40 meq via ORAL
  Filled 2024-12-14: qty 2

## 2024-12-14 NOTE — Telephone Encounter (Signed)
" ° ° ° °  Reason for Triage: saw dr ellison for the first time . Going through a situation coughing up lots of phlegm green stuff been going on for four days now . Wanting a appointment today Reason for Disposition  [1] MODERATE difficulty breathing (e.g., speaks in phrases, SOB even at rest, pulse 100-120) AND [2] still present when not coughing  Answer Assessment - Initial Assessment Questions Pt states she saw Dr. Kassie for the first time recently for chronic cough. She finished an atbx last week from ENT for a sinus infection. Friday her cough got worse, more phlegm with green stuff, increased shortness of breath and weird sound when she breaths. Rn can audibly hear stridor and wheezing. RN recommended pt go to the Er. Pt states is there not anyone there who can see me today? Rn advised that the sound pt is hearing typically indicates swelling of her airways and ER is the best place of resources for that. Pt stated understanding. She states she just doesn't want to sit in a room waiting all day. RN understands frustration. Pt states she will go to [facility].  Due to ER dispo, not all triage questions asked.      1. ONSET: When did the cough begin?      Worsened on Friday 2. SEVERITY: How bad is the cough today?      moderate 3. SPUTUM: Describe the color of your sputum (e.g., none, dry cough; clear, white, yellow, green)     green 4. HEMOPTYSIS: Are you coughing up any blood? If Yes, ask: How much? (e.g., flecks, streaks, tablespoons, etc.)     5. DIFFICULTY BREATHING: Are you having difficulty breathing? If Yes, ask: How bad is it? (e.g., mild, moderate, severe)      Yes moderate, having to take a deep breath every few words 6. FEVER: Do you have a fever? If Yes, ask: What is your temperature, how was it measured, and when did it start?      7. CARDIAC HISTORY: Do you have any history of heart disease? (e.g., heart attack, congestive heart failure)       8. LUNG  HISTORY: Do you have any history of lung disease?  (e.g., pulmonary embolus, asthma, emphysema)      9. PE RISK FACTORS: Do you have a history of blood clots? (or: recent major surgery, recent prolonged travel, bedridden)      10. OTHER SYMPTOMS: Do you have any other symptoms? (e.g., runny nose, wheezing, chest pain)       Stridor and wheezing  Protocols used: Cough - Acute Productive-A-AH  "

## 2024-12-14 NOTE — ED Provider Notes (Signed)
 " [place] EMERGENCY DEPARTMENT AT [facility] Provider Note   CSN: [id number] Arrival date & time: 12/14/24  1158     Patient presents with: Cough   Theresa Bradley is a 74 y.o. female with medical history significant for hypothyroidism, GERD, history of breast cancer, hypertension, and history of chronic cough who presents to the ED due to worsening cough and shortness of breath x 4 days.  Patient notes she has been dealing with a chronic cough for the past 10 years.  Follows ENT and pulmonology.  Patient notes her cough became productive over the past 4 days.  Admits to green phlegm.  Also admits to shortness of breath associated with wheeze.  No history of asthma.  Admits to feeling warm last night.  Denies lower extremity edema.  No history of blood clots.  Denies tobacco abuse.  Patient currently on reflux medication per ENT.  Also recently on antibiotics for sinusitis prescribed on 1/5 by ENT.  Patient does note she has had a little bit of chest pressure intermittently for the past few days.  History obtained from patient and past medical records. No interpreter used during encounter.      Prior to Admission medications  Medication Sig Start Date End Date Taking? Authorizing Provider  ALPRAZolam  (XANAX ) 0.25 MG tablet TAKE 1/2 TABLET BY MOUTH AT BEDTIME AS NEEDED FOR INSOMNIA. 12/14/24   [name], Wolm ORN, MD  amLODipine  (NORVASC ) 5 MG tablet Take 1 tablet (5 mg total) by mouth daily. 10/05/24   [name], Wolm ORN, MD  amoxicillin -clavulanate (AUGMENTIN ) 875-125 MG tablet Take 1 tablet by mouth 2 (two) times daily. 11/28/24   Anice Riis, DO  BREO ELLIPTA  100-25 MCG/ACT AEPB Inhale 1 puff into the lungs daily.    [provider]  CALCIUM-VITAMIN D PO Take 2 tablets by mouth daily.    [provider]  citalopram  (CELEXA ) 20 MG tablet TAKE 1 TABLET BY MOUTH EVERY DAY 05/16/24   [name], Wolm ORN, MD  EPIPEN  2-PAK 0.3 MG/0.3ML SOAJ injection Inject 0.3 mg  into the muscle as needed for anaphylaxis.  03/08/14   [provider]  fluticasone  (FLONASE ) 50 MCG/ACT nasal spray Place 2 sprays into both nostrils 2 (two) times daily. 06/17/24   Soldatova, Liuba, MD  Fluticasone  Furoate-Vilanterol (BREO ELLIPTA  IN) Inhale 1 puff into the lungs daily.    [provider]  gabapentin  (NEURONTIN ) 100 MG capsule Take 1 capsule (100 mg total) by mouth 2 (two) times daily. In morning and afternoon. Ok to take in addition with gabapentin  300 mg nightly 11/25/24   Theresa Acquanetta Bradley, MD  gabapentin  (NEURONTIN ) 300 MG capsule TAKE 1 CAPSULE BY MOUTH EVERYDAY AT BEDTIME 05/11/24   Iruku, Praveena, MD  hydrochlorothiazide  (MICROZIDE ) 12.5 MG capsule Take 1 capsule (12.5 mg total) by mouth daily. 10/05/24   [name], Wolm ORN, MD  levalbuterol (XOPENEX HFA) 45 MCG/ACT inhaler 2 puffs.    [provider]  levothyroxine  (SYNTHROID ) 50 MCG tablet Take 1 tablet (50 mcg total) by mouth daily. 10/05/24   [name], Wolm ORN, MD  meloxicam  (MOBIC ) 15 MG tablet TAKE 1 TABLET BY MOUTH EVERY DAY 11/04/24   [name], Wolm ORN, MD  pantoprazole  (PROTONIX ) 40 MG tablet Take 1 tablet (40 mg total) by mouth 2 (two) times daily before a meal. 11/28/24   Anice Riis, DO  polyethylene glycol (MIRALAX  / GLYCOLAX ) 17 g packet Take 17 g by mouth every other day.    [provider]  potassium chloride  (KLOR-CON   10) 10 MEQ tablet Take two tablets once daily 12/09/24   [name], Wolm ORN, MD  tamoxifen  (NOLVADEX ) 20 MG tablet TAKE 1 TABLET BY MOUTH EVERY DAY 10/05/24   Iruku, Praveena, MD  prochlorperazine  (COMPAZINE ) 10 MG tablet Take 1 tablet (10 mg total) by mouth every 6 (six) hours as needed (Nausea or vomiting). 04/01/20 07/03/20  [name], Sandria BROCKS, MD    Allergies: Codeine sulfate, Erythromycin base, and Esomeprazole magnesium     Review of Systems  Constitutional:  Positive for fever.  Respiratory:  Positive for cough and shortness of breath.    Cardiovascular:  Positive for chest pain.  Gastrointestinal:  Negative for abdominal pain.    Updated Vital Signs BP (!) 118/58   Pulse 93   Temp 98.5 F (36.9 C) (Oral)   Resp 12   SpO2 98%   Physical Exam Vitals and nursing note reviewed.  Constitutional:      General: She is not in acute distress.    Appearance: She is not ill-appearing.  HENT:     Head: Normocephalic.  Eyes:     Pupils: Pupils are equal, round, and reactive to light.  Cardiovascular:     Rate and Rhythm: Normal rate and regular rhythm.     Pulses: Normal pulses.     Heart sounds: Normal heart sounds. No murmur heard.    No friction rub. No gallop.  Pulmonary:     Effort: Pulmonary effort is normal.     Breath sounds: Wheezing present.     Comments: Cough during exam. Expiratory wheeze. Speaking in full sentences.  Abdominal:     General: Abdomen is flat. There is no distension.     Palpations: Abdomen is soft.     Tenderness: There is no abdominal tenderness. There is no guarding or rebound.  Musculoskeletal:        General: Normal range of motion.     Cervical back: Neck supple.     Comments: No lower extremity edema  Skin:    General: Skin is warm and dry.  Neurological:     General: No focal deficit present.     Mental Status: She is alert.  Psychiatric:        Mood and Affect: Mood normal.        Behavior: Behavior normal.     (all labs ordered are listed, but only abnormal results are displayed) Labs Reviewed  CBC WITH DIFFERENTIAL/PLATELET - Abnormal; Notable for the following components:      Result Value   Eosinophils Absolute 1.3 (*)    All other components within normal limits  BASIC METABOLIC PANEL WITH GFR - Abnormal; Notable for the following components:   Potassium 3.1 (*)    Glucose, Bld 165 (*)    Creatinine, Ser 1.13 (*)    GFR, Estimated 50 (*)    All other components within normal limits  RESP PANEL BY RT-PCR (RSV, FLU A&B, COVID)  RVPGX2  MAGNESIUM   TROPONIN T,  HIGH SENSITIVITY  TROPONIN T, HIGH SENSITIVITY    EKG: EKG Interpretation Date/Time:  Wednesday December 14 2024 13:05:41 EST Ventricular Rate:  76 PR Interval:  173 QRS Duration:  96 QT Interval:  461 QTC Calculation: 519 R Axis:   43  Text Interpretation: Sinus rhythm Nonspecific T abnrm, anterolateral leads Prolonged QT interval Confirmed by Tonia Chew [phone]) on 12/14/2024 1:56:04 PM  Radiology: CT Angio Chest PE W and/or Wo Contrast Result Date: 12/14/2024 CLINICAL DATA:  Hypoxia short of breath EXAM: CT ANGIOGRAPHY  CHEST WITH CONTRAST TECHNIQUE: Multidetector CT imaging of the chest was performed using the standard protocol during bolus administration of intravenous contrast. Multiplanar CT image reconstructions and MIPs were obtained to evaluate the vascular anatomy. RADIATION DOSE REDUCTION: This exam was performed according to the departmental dose-optimization program which includes automated exposure control, adjustment of the mA and/or kV according to patient size and/or use of iterative reconstruction technique. CONTRAST:  OMNIPAQUE  IOHEXOL  350 MG/ML SOLN COMPARISON:  Chest x-ray 12/14/2024, chest CT 12/12/2024 FINDINGS: Cardiovascular: Satisfactory opacification of the pulmonary arteries to the segmental level. No evidence of pulmonary embolism. Nonaneurysmal aorta. No dissection. Mild atherosclerosis. Normal cardiac size. No pericardial effusion. Mild coronary vascular calcification Mediastinum/Nodes: Patent trachea. No thyroid  mass. No suspicious lymph nodes. Esophagus within normal limits. Lungs/Pleura: No acute airspace disease, pleural effusion or pneumothorax. Minimal mucous plugging of nondilated upper lobe and lower lobe bronchi. Multiple scattered pulmonary nodules, for example 5 mm lingular nodule on series 6, image 85. Right middle lobe 6 mm pulmonary nodule on series 6, image 63. Anterior right lung base 4 mm pulmonary nodule on series 6, image 93. Vague right  lower lobe 6 mm nodular focus on series 6, image 74. Upper Abdomen: No acute finding.  10 mm gastrohepatic node. Musculoskeletal: Right mastectomy.  No acute osseous abnormality Review of the MIP images confirms the above findings. IMPRESSION: 1. Negative for acute pulmonary embolus or aortic dissection. 2. Minimal mucous plugging of nondilated upper lobe and lower lobe bronchi potentially due to airways inflammatory process. No acute airspace disease. 3. Multiple scattered pulmonary nodules measuring up to 6 mm. Follow-up according to oncologic protocol recommended given history of breast cancer 4. Aortic atherosclerosis. Aortic Atherosclerosis (ICD10-I70.0). Electronically Signed   By: Luke Bun M.D.   On: 12/14/2024 18:00   DG Chest Portable 1 View Result Date: 12/14/2024 CLINICAL DATA:  Cough. EXAM: PORTABLE CHEST 1 VIEW COMPARISON:  Apr 12, 2021 FINDINGS: A right-sided venous Port-A-Cath seen on the prior study has been removed. The heart size and mediastinal contours are within normal limits. No acute infiltrate, pleural effusion or pneumothorax is identified. Radiopaque surgical clips are seen overlying the right upper quadrant. The visualized skeletal structures are unremarkable. IMPRESSION: No active disease. Electronically Signed   By: Suzen Dials M.D.   On: 12/14/2024 12:49     .Critical Care  Performed by: Lorelle Aleck BROCKS, PA-C Authorized by: Lorelle Aleck BROCKS, PA-C   Critical care provider statement:    Critical care time (minutes):  31   Critical care was necessary to treat or prevent imminent or life-threatening deterioration of the following conditions:  Respiratory failure   Critical care was time spent personally by me on the following activities:  Development of treatment plan with patient or surrogate, discussions with consultants, evaluation of patient's response to treatment, examination of patient, ordering and review of laboratory studies, ordering and review of  radiographic studies, ordering and performing treatments and interventions, pulse oximetry, re-evaluation of patient's condition and review of old charts   I assumed direction of critical care for this patient from another provider in my specialty: no     Care discussed with: admitting provider      Medications Ordered in the ED  albuterol  (PROVENTIL ) (2.5 MG/3ML) 0.083% nebulizer solution 5 mg (2.5 mg Nebulization Given 12/14/24 1221)  ipratropium-albuterol  (DUONEB) 0.5-2.5 (3) MG/3ML nebulizer solution 3 mL (3 mLs Nebulization Given 12/14/24 1222)  methylPREDNISolone  sodium succinate (SOLU-MEDROL ) 125 mg/2 mL injection 125 mg (125 mg Intravenous Given  12/14/24 1257)  potassium chloride  SA (KLOR-CON  M) CR tablet 40 mEq (40 mEq Oral Given 12/14/24 1445)  ipratropium-albuterol  (DUONEB) 0.5-2.5 (3) MG/3ML nebulizer solution 3 mL (3 mLs Nebulization Given 12/14/24 1447)  iohexol  (OMNIPAQUE ) 350 MG/ML injection 75 mL (100 mLs Intravenous Contrast Given 12/14/24 1730)    Clinical Course as of 12/14/24 1829  Wed Dec 14, 2024  1347 Potassium(!): 3.1 [CA]  1353 Reassessed patient at bedside.  Lungs still with wheeze however, some improvement from prior.  Will give another DuoNeb treatment.  Called [facility]ology to have CT chest that was performed on 1/19 read. [CA]  1439 Magnesium : 1.9 [CA]  1727 Patient consistently in upper 80s and low 90s.  Discussed with respiratory.  Patient placed on 2 L nasal cannula. [CA]    Clinical Course User Index [CA] Lorelle Aleck BROCKS, PA-C                                 Medical Decision Making Amount and/or Complexity of Data Reviewed External Data Reviewed: notes.    Details: ENT/pulm notes Labs: ordered. Decision-making details documented in ED Course. Radiology: ordered and independent interpretation performed. Decision-making details documented in ED Course. ECG/medicine tests: ordered and independent interpretation performed. Decision-making details  documented in ED Course.  Risk Prescription drug management. Decision regarding hospitalization.   This patient presents to the ED for concern of cough, this involves an extensive number of treatment options, and is a complaint that carries with it a high risk of complications and morbidity.  The differential diagnosis includes PNA, viral process, malignancy, etc  74 year old female presents to the ED due to acute on chronic cough x 4 days.  Patient has had a persistent cough for the past 10 years and currently follows ENT and pulmonology.  Cough has worsened over the past 4 days associated with green phlegm and shortness of breath.  No history of asthma.  Upon arrival patient afebrile, not tachycardic or hypoxic.  Patient coughing during initial evaluation.  Lungs with expiratory wheeze heard throughout.  No lower extremity edema.  Routine labs ordered.  Chest x-ray to rule out evidence of pneumonia.  RVP to rule out infection.  DuoNeb treatment and Solu-Medrol  given.  Respiratory at bedside during initial evaluation.  CBC with no leukocytosis.  Normal hemoglobin.  BMP with hypokalemia 3.1.  Potassium given.  Elevated creatinine 1.13.  Normal BUN.  troponin normal.  EKG normal sinus rhythm.  Does demonstrate prolonged QTc. Low suspicion for ACS.   Upon reassessment, patient's O2 saturation at 89% with good waveform on monitor while I was in the room.  Lungs with improvement in expiratory wheeze.  Patient notes she feels much better after 2 breathing treatments.  Given patient is now having intermittent hypoxic episodes will obtain CTA chest to rule out PE.  Patient agreeable to plan.  Advised patient she likely may need admission for further treatment.  CTA Negative for PE or dissection.  Does demonstrate minimal mucous plugging of nondilated upper and lower lobe bronchi. Also demonstrates multiple pulmonary nodules.  Given patient is having new oxygen  requirement, will discuss with hospitalist for  admission.   6:28 PM Discussed with Dr. Lou with [facility] who agrees to admit patient.    Co morbidities that complicate the patient evaluation  HTN, hx breast cancer Cardiac Monitoring: / EKG:  The patient was maintained on a cardiac monitor.  I personally viewed and interpreted the cardiac  monitored which showed an underlying rhythm of: NSR HR 76  Social Determinants of Health:  Elderly >65  Test / Admission - Considered:  Admit for respiratory failure with new oxygen  requirement.       Final diagnoses:  Acute respiratory failure with hypoxia (HCC)  Mucus plugging of bronchi  Pulmonary nodules    ED Discharge Orders     None          Lorelle Aleck JAYSON DEVONNA 12/14/24 1829    Tonia Chew, MD 12/16/24 1931  "

## 2024-12-14 NOTE — Telephone Encounter (Signed)
 FYI Only or Action Required?: FYI only for provider: ED advised.  Patient is followed in Pulmonology for chronic cough, last seen on 11/25/2024 by [name], MD.  Called Nurse Triage reporting Shortness of Breath and Cough.  Symptoms began several days ago.  Interventions attempted: Rescue inhaler.  Symptoms are: gradually worsening.  Triage Disposition: Go to ED Now (Notify PCP)  Patient/caregiver understands and will follow disposition?: Yes

## 2024-12-14 NOTE — ED Provider Notes (Incomplete)
 I provided a substantive portion of the care of this patient.  I personally made/approved the management plan for this patient and take responsibility for the patient management. {Remember to document shared critical care using edcritical dot phrase:1} EKG Interpretation Date/Time:  Wednesday December 14 2024 13:05:41 EST Ventricular Rate:  76 PR Interval:  173 QRS Duration:  96 QT Interval:  461 QTC Calculation: 519 R Axis:   43  Text Interpretation: Sinus rhythm Nonspecific T abnrm, anterolateral leads Prolonged QT interval Confirmed by Tonia Chew [phone]) on 12/14/2024 1:56:04 PM

## 2024-12-14 NOTE — ED Triage Notes (Signed)
 Reports worsening chronic cough x 2 days.  Referred from Pulm. No fevers.   Wheezing during triage.

## 2024-12-14 NOTE — Telephone Encounter (Signed)
"  Pt to ED   "

## 2024-12-14 NOTE — Progress Notes (Signed)
 Plan of Care Note for accepted transfer   Patient: Theresa Bradley MRN: [id number]   DOA: 12/14/2024  Facility requesting transfer: [facility] Requesting Provider: Tonia Chew, MD; Lorelle Aleck BROCKS, PA-C Reason for transfer: Respiratory failure Facility course: 74 year old female with PMH of hypothyroidism, GERD, history of breast cancer, hypertension, and chronic cough who presented with 4 days of worsening cough, and shortness of breath.   Initial vitals overall stable, afebrile, RR 12-22, HR 70-80s, SBP 110-130s, SpO2 initially 96% on room air, dropped to the high 80s and placed on 2 L [place].  Labs significant for K+ 3.1, creatinine 1.13, normal CBC, normal troponin, negative flu/RSV and COVID test. EKG shows sinus rhythm with prolonged QTc of 519. CXR neg. CTA chest PE study negative for PE but shows minimal mucous plugging of the nondilated upper lobe and lower lobe bronchi potentially due to airways inflammatory process. No acute airway disease.  Patient received multiple DuoNebs, IV Solu-Medrol  and KCl 40 mEq x 1. Patient admitted to [facility] service.   Plan of care: The patient is accepted for admission to Telemetry unit, at [facility]..  Admitting for management of acute hypoxic respiratory failure Possible pulmonology consult  Author: Claretta CHRISTELLA Alderman, MD 12/14/2024  Check [email or web address] for on-call coverage.  Nursing staff, Please call [facility] Admits & Consults System-Wide number on Amion as soon as patient's arrival, so appropriate admitting provider can evaluate the pt.

## 2024-12-14 NOTE — ED Notes (Addendum)
 Ambulating pt with pulse ox, maintained 93-94% with HR of 95-101 during entire event.  Once returning to room, had an instance of 15 seconds of 89% SpO2, and stated she felt a little more SOB after returning to room, but quickly rebounded to 93-94%.  PA-C is aware.

## 2024-12-15 DIAGNOSIS — I10 Essential (primary) hypertension: Secondary | ICD-10-CM | POA: Diagnosis present

## 2024-12-15 DIAGNOSIS — Z888 Allergy status to other drugs, medicaments and biological substances status: Secondary | ICD-10-CM | POA: Diagnosis not present

## 2024-12-15 DIAGNOSIS — Z7951 Long term (current) use of inhaled steroids: Secondary | ICD-10-CM | POA: Diagnosis not present

## 2024-12-15 DIAGNOSIS — Z9011 Acquired absence of right breast and nipple: Secondary | ICD-10-CM | POA: Diagnosis not present

## 2024-12-15 DIAGNOSIS — Z923 Personal history of irradiation: Secondary | ICD-10-CM | POA: Diagnosis not present

## 2024-12-15 DIAGNOSIS — R062 Wheezing: Secondary | ICD-10-CM | POA: Diagnosis not present

## 2024-12-15 DIAGNOSIS — Z79899 Other long term (current) drug therapy: Secondary | ICD-10-CM | POA: Diagnosis not present

## 2024-12-15 DIAGNOSIS — R918 Other nonspecific abnormal finding of lung field: Secondary | ICD-10-CM | POA: Diagnosis present

## 2024-12-15 DIAGNOSIS — Z9049 Acquired absence of other specified parts of digestive tract: Secondary | ICD-10-CM | POA: Diagnosis not present

## 2024-12-15 DIAGNOSIS — Z79810 Long term (current) use of selective estrogen receptor modulators (SERMs): Secondary | ICD-10-CM | POA: Diagnosis not present

## 2024-12-15 DIAGNOSIS — Z801 Family history of malignant neoplasm of trachea, bronchus and lung: Secondary | ICD-10-CM | POA: Diagnosis not present

## 2024-12-15 DIAGNOSIS — Z853 Personal history of malignant neoplasm of breast: Secondary | ICD-10-CM

## 2024-12-15 DIAGNOSIS — Z79890 Hormone replacement therapy (postmenopausal): Secondary | ICD-10-CM | POA: Diagnosis not present

## 2024-12-15 DIAGNOSIS — K219 Gastro-esophageal reflux disease without esophagitis: Secondary | ICD-10-CM | POA: Diagnosis present

## 2024-12-15 DIAGNOSIS — Z9221 Personal history of antineoplastic chemotherapy: Secondary | ICD-10-CM | POA: Diagnosis not present

## 2024-12-15 DIAGNOSIS — R059 Cough, unspecified: Secondary | ICD-10-CM | POA: Diagnosis not present

## 2024-12-15 DIAGNOSIS — C50911 Malignant neoplasm of unspecified site of right female breast: Secondary | ICD-10-CM | POA: Diagnosis present

## 2024-12-15 DIAGNOSIS — Z881 Allergy status to other antibiotic agents status: Secondary | ICD-10-CM | POA: Diagnosis not present

## 2024-12-15 DIAGNOSIS — Z803 Family history of malignant neoplasm of breast: Secondary | ICD-10-CM | POA: Diagnosis not present

## 2024-12-15 DIAGNOSIS — Z8249 Family history of ischemic heart disease and other diseases of the circulatory system: Secondary | ICD-10-CM | POA: Diagnosis not present

## 2024-12-15 DIAGNOSIS — Z1152 Encounter for screening for COVID-19: Secondary | ICD-10-CM | POA: Diagnosis not present

## 2024-12-15 DIAGNOSIS — F39 Unspecified mood [affective] disorder: Secondary | ICD-10-CM | POA: Diagnosis present

## 2024-12-15 DIAGNOSIS — J9601 Acute respiratory failure with hypoxia: Secondary | ICD-10-CM | POA: Diagnosis present

## 2024-12-15 DIAGNOSIS — Z833 Family history of diabetes mellitus: Secondary | ICD-10-CM | POA: Diagnosis not present

## 2024-12-15 DIAGNOSIS — T17590A Other foreign object in bronchus causing asphyxiation, initial encounter: Secondary | ICD-10-CM | POA: Diagnosis present

## 2024-12-15 DIAGNOSIS — E039 Hypothyroidism, unspecified: Secondary | ICD-10-CM | POA: Diagnosis present

## 2024-12-15 DIAGNOSIS — Z885 Allergy status to narcotic agent status: Secondary | ICD-10-CM | POA: Diagnosis not present

## 2024-12-15 LAB — BASIC METABOLIC PANEL WITH GFR
Anion gap: 9 (ref 5–15)
BUN: 11 mg/dL (ref 8–23)
CO2: 27 mmol/L (ref 22–32)
Calcium: 9.7 mg/dL (ref 8.9–10.3)
Chloride: 96 mmol/L — ABNORMAL LOW (ref 98–111)
Creatinine, Ser: 0.95 mg/dL (ref 0.44–1.00)
GFR, Estimated: >60 mL/min
Glucose, Bld: 129 mg/dL — ABNORMAL HIGH (ref 70–99)
Potassium: 4.2 mmol/L (ref 3.5–5.1)
Sodium: 132 mmol/L — ABNORMAL LOW (ref 135–145)

## 2024-12-15 LAB — CBC
HCT: 34.3 % — ABNORMAL LOW (ref 36.0–46.0)
Hemoglobin: 11.8 g/dL — ABNORMAL LOW (ref 12.0–15.0)
MCH: 30.4 pg (ref 26.0–34.0)
MCHC: 34.4 g/dL (ref 30.0–36.0)
MCV: 88.4 fL (ref 80.0–100.0)
Platelets: 299 K/uL (ref 150–400)
RBC: 3.88 MIL/uL (ref 3.87–5.11)
RDW: 12.7 % (ref 11.5–15.5)
WBC: 18.9 K/uL — ABNORMAL HIGH (ref 4.0–10.5)
nRBC: 0 % (ref 0.0–0.2)

## 2024-12-15 LAB — CREATININE, SERUM
Creatinine, Ser: 1.05 mg/dL — ABNORMAL HIGH (ref 0.44–1.00)
GFR, Estimated: 55 mL/min — ABNORMAL LOW

## 2024-12-15 MED ORDER — METHYLPREDNISOLONE SODIUM SUCC 125 MG IJ SOLR
60.0000 mg | Freq: Two times a day (BID) | INTRAMUSCULAR | Status: DC
  Administered 2024-12-15: 60 mg via INTRAVENOUS
  Filled 2024-12-15: qty 2

## 2024-12-15 MED ORDER — HYDRALAZINE HCL 20 MG/ML IJ SOLN: 10.0000 mg | Freq: Four times a day (QID) | INTRAMUSCULAR | Status: DC | PRN

## 2024-12-15 MED ORDER — MAGNESIUM SULFATE 2 GM/50ML IV SOLN
2.0000 g | Freq: Once | INTRAVENOUS | Status: AC
  Administered 2024-12-15: 2 g via INTRAVENOUS
  Filled 2024-12-15: qty 50

## 2024-12-15 MED ORDER — ARFORMOTEROL TARTRATE 15 MCG/2ML IN NEBU
15.0000 ug | INHALATION_SOLUTION | Freq: Two times a day (BID) | RESPIRATORY_TRACT | Status: DC
  Administered 2024-12-15 – 2024-12-16 (×2): 15 ug via RESPIRATORY_TRACT
  Filled 2024-12-15 (×3): qty 2

## 2024-12-15 MED ORDER — IPRATROPIUM-ALBUTEROL 0.5-2.5 (3) MG/3ML IN SOLN
3.0000 mL | Freq: Three times a day (TID) | RESPIRATORY_TRACT | Status: DC
  Administered 2024-12-15 (×2): 3 mL via RESPIRATORY_TRACT
  Filled 2024-12-15 (×3): qty 3

## 2024-12-15 MED ORDER — HYDROCHLOROTHIAZIDE 12.5 MG PO TABS
12.5000 mg | ORAL_TABLET | Freq: Every day | ORAL | Status: DC
  Administered 2024-12-15 – 2024-12-16 (×2): 12.5 mg via ORAL
  Filled 2024-12-15 (×2): qty 1

## 2024-12-15 MED ORDER — GUAIFENESIN ER 600 MG PO TB12
600.0000 mg | ORAL_TABLET | Freq: Two times a day (BID) | ORAL | Status: DC
  Administered 2024-12-15 – 2024-12-16 (×2): 600 mg via ORAL
  Filled 2024-12-15 (×3): qty 1

## 2024-12-15 MED ORDER — PANTOPRAZOLE SODIUM 40 MG PO TBEC
40.0000 mg | DELAYED_RELEASE_TABLET | Freq: Every day | ORAL | Status: DC
  Administered 2024-12-15 – 2024-12-16 (×2): 40 mg via ORAL
  Filled 2024-12-15 (×2): qty 1

## 2024-12-15 MED ORDER — POLYETHYLENE GLYCOL 3350 17 G PO PACK: 17.0000 g | PACK | Freq: Every day | ORAL | Status: DC | PRN

## 2024-12-15 MED ORDER — POTASSIUM CHLORIDE ER 10 MEQ PO TBCR
10.0000 meq | EXTENDED_RELEASE_TABLET | Freq: Every day | ORAL | Status: DC
  Administered 2024-12-16: 10 meq via ORAL
  Filled 2024-12-15 (×4): qty 1

## 2024-12-15 MED ORDER — TAMOXIFEN CITRATE 10 MG PO TABS
20.0000 mg | ORAL_TABLET | Freq: Every day | ORAL | Status: DC
  Administered 2024-12-16: 20 mg via ORAL
  Filled 2024-12-15 (×2): qty 2

## 2024-12-15 MED ORDER — TRIMETHOBENZAMIDE HCL 100 MG/ML IM SOLN: 200.0000 mg | Freq: Three times a day (TID) | INTRAMUSCULAR | Status: DC | PRN

## 2024-12-15 MED ORDER — BENZONATATE 100 MG PO CAPS: 200.0000 mg | ORAL_CAPSULE | Freq: Three times a day (TID) | ORAL | Status: DC | PRN

## 2024-12-15 MED ORDER — OXYCODONE HCL 5 MG PO TABS: 5.0000 mg | ORAL_TABLET | ORAL | Status: DC | PRN

## 2024-12-15 MED ORDER — AMLODIPINE BESYLATE 5 MG PO TABS
5.0000 mg | ORAL_TABLET | Freq: Every day | ORAL | Status: DC
  Administered 2024-12-15 – 2024-12-16 (×2): 5 mg via ORAL
  Filled 2024-12-15 (×2): qty 1

## 2024-12-15 MED ORDER — SODIUM CHLORIDE 0.9% FLUSH: 3.0000 mL | Freq: Two times a day (BID) | INTRAVENOUS | Status: DC

## 2024-12-15 MED ORDER — IPRATROPIUM-ALBUTEROL 0.5-2.5 (3) MG/3ML IN SOLN
3.0000 mL | Freq: Four times a day (QID) | RESPIRATORY_TRACT | Status: DC
  Administered 2024-12-15: 3 mL via RESPIRATORY_TRACT
  Filled 2024-12-15: qty 3

## 2024-12-15 MED ORDER — SODIUM CHLORIDE 0.9% FLUSH: 3.0000 mL | INTRAVENOUS | Status: DC | PRN

## 2024-12-15 MED ORDER — ALPRAZOLAM 0.25 MG PO TABS: 0.1250 mg | ORAL_TABLET | Freq: Every evening | ORAL | Status: DC | PRN

## 2024-12-15 MED ORDER — BISACODYL 10 MG RE SUPP: 10.0000 mg | Freq: Every day | RECTAL | Status: DC | PRN

## 2024-12-15 MED ORDER — BUDESONIDE 0.25 MG/2ML IN SUSP
0.2500 mg | Freq: Two times a day (BID) | RESPIRATORY_TRACT | Status: DC
  Administered 2024-12-15 – 2024-12-16 (×2): 0.25 mg via RESPIRATORY_TRACT
  Filled 2024-12-15 (×3): qty 2

## 2024-12-15 MED ORDER — ACETAMINOPHEN 325 MG PO TABS: 650.0000 mg | ORAL_TABLET | Freq: Four times a day (QID) | ORAL | Status: DC | PRN

## 2024-12-15 MED ORDER — ENOXAPARIN SODIUM 40 MG/0.4ML IJ SOSY
40.0000 mg | PREFILLED_SYRINGE | INTRAMUSCULAR | Status: DC
  Administered 2024-12-15: 40 mg via SUBCUTANEOUS
  Filled 2024-12-15: qty 0.4

## 2024-12-15 MED ORDER — DOXYCYCLINE HYCLATE 100 MG PO TABS
100.0000 mg | ORAL_TABLET | Freq: Two times a day (BID) | ORAL | Status: DC
  Administered 2024-12-15 – 2024-12-16 (×3): 100 mg via ORAL
  Filled 2024-12-15 (×3): qty 1

## 2024-12-15 MED ORDER — ALBUTEROL SULFATE (2.5 MG/3ML) 0.083% IN NEBU: 2.5000 mg | INHALATION_SOLUTION | RESPIRATORY_TRACT | Status: DC | PRN

## 2024-12-15 MED ORDER — LORATADINE 10 MG PO TABS
10.0000 mg | ORAL_TABLET | Freq: Every day | ORAL | Status: DC
  Administered 2024-12-15 – 2024-12-16 (×2): 10 mg via ORAL
  Filled 2024-12-15 (×2): qty 1

## 2024-12-15 MED ORDER — OXYMETAZOLINE HCL 0.05 % NA SOLN
1.0000 | Freq: Two times a day (BID) | NASAL | Status: DC
  Filled 2024-12-15: qty 30

## 2024-12-15 MED ORDER — ACETAMINOPHEN 650 MG RE SUPP: 650.0000 mg | Freq: Four times a day (QID) | RECTAL | Status: DC | PRN

## 2024-12-15 MED ORDER — HYDROCOD POLI-CHLORPHE POLI ER 10-8 MG/5ML PO SUER: 5.0000 mL | Freq: Two times a day (BID) | ORAL | Status: DC | PRN

## 2024-12-15 MED ORDER — PREDNISONE 20 MG PO TABS
40.0000 mg | ORAL_TABLET | Freq: Every day | ORAL | Status: DC
  Administered 2024-12-16: 40 mg via ORAL
  Filled 2024-12-15: qty 2

## 2024-12-15 MED ORDER — SODIUM CHLORIDE 0.9 % IV SOLN: 250.0000 mL | INTRAVENOUS | Status: DC | PRN

## 2024-12-15 MED ORDER — SODIUM CHLORIDE 0.9 % IV SOLN
2.0000 g | INTRAVENOUS | Status: DC
  Administered 2024-12-15 – 2024-12-16 (×2): 2 g via INTRAVENOUS
  Filled 2024-12-15 (×2): qty 20

## 2024-12-15 MED ORDER — FLUTICASONE PROPIONATE 50 MCG/ACT NA SUSP
2.0000 | Freq: Every day | NASAL | Status: DC
  Administered 2024-12-15 – 2024-12-16 (×2): 2 via NASAL
  Filled 2024-12-15: qty 16

## 2024-12-15 NOTE — H&P (Signed)
 " Virtual History and Physical   Referring Provider: Fonda Bradley Telemedicine Provider: Donalda Applebaum Bradley Provider Location: [place] Patient Location: [facility] Referring Diagnosis: Acute on Chronic Bronchitis Patient Name and DOB verified: yes Patient consented to Telemedicine Evaluation:yes RN virtual assistant: Nidia Clinch RN Video encounter time and date:12/15/24 at 7:58 am   Patient: Theresa Bradley FMW:[id number] DOB: 07/14/1951 PCP: Theresa Bradley:  Chief Bradley  Patient presents with   Cough   HPI: Theresa Bradley is a 74 y.o. female with medical history significant of hypothyroidism, hypertension, GERD, history of breast cancer, chronic cough-who presented to the ED with worsening of chronic cough and shortness of breath.  Per patient-she has had chronic cough for almost 1-2 years-she has had extensive workup done by ENT without any significant improvement-she was recently referred to a pulmonologist-and she just saw her for the first time earlier this month.  Per patient-over the past year or so-she has had similar episodes of worsening of her chronic cough associated with shortness of breath.  Patient claims that she got sick/had URI-like symptoms over the Christmas holiday-and she recovered from that-only to get more cough starting approximately a week ago.  She describes the cough as unrelenting when she has coughing spells-cough is productive-with yellow-greenish sputum.  For the past several days she has had some worsening of shortness of breath.  The day before she tried her rescue inhaler and felt somewhat better.  She has no fever.  Denies any chest pain.  Because of worsening symptoms-she presented to the ED-where she was found to be wheezing-CT scan of her chest PE protocol did not show any PE but showed mucous plugging.  She was given steroids-bronchodilators-with some improvement-however she was still not back to her  baseline-hence the hospitalist service was asked to admit this patient for further evaluation and treatment.  No fever No headache No chest pain No nausea, vomiting or diarrhea No abdominal pain No dysuria/hematuria    Review of Systems: As mentioned in the history of present illness. All other systems reviewed and are negative. Past Medical History:  Diagnosis Date   Anxiety    Breast cancer (HCC) 2009   right   Breast cancer (HCC) 2021   Cancer (HCC)    COUGH, CHRONIC 05/10/2010   Dysrhythmia    occationally skips a beat   Family history of brain cancer    Family history of breast cancer    Family history of lung cancer    Family history of multiple myeloma    Family history of skin cancer    GERD (gastroesophageal reflux disease)    HYPERTENSION 04/16/2009   HYPOTHYROIDISM 04/16/2009   OSTEOARTHRITIS 04/16/2009   Personal history of chemotherapy 2021   Personal history of radiation therapy 2009   SENILE LENTIGO 06/10/2010   Past Surgical History:  Procedure Laterality Date   BREAST EXCISIONAL BIOPSY Right    BREAST EXCISIONAL BIOPSY Left    BREAST LUMPECTOMY Right 2009   radiation only   BREAST LUMPECTOMY WITH RADIOACTIVE SEED AND SENTINEL LYMPH NODE BIOPSY Right 01/31/2020   Procedure: RIGHT BREAST LUMPECTOMY WITH RADIOACTIVE SEED AND SENTINEL LYMPH NODE BIOPSY;  Surgeon: Vanderbilt Ned, Bradley;  Location: [facility];  Service: General;  Laterality: Right;  PEC BLOCK   BREAST SURGERY  2009   X 2, cancer stage 0, lumpectomy   CESAREAN SECTION     1979, 1982   CHOLECYSTECTOMY     COLONOSCOPY  DILATATION & CURETTAGE/HYSTEROSCOPY WITH MYOSURE  11/2015   HERNIA REPAIR  2009   JOINT REPLACEMENT  2008   hip   MASTECTOMY Right 2021   OPEN SURGICAL REPAIR OF GLUTEAL TENDON Left 07/18/2019   Procedure: Left hip bearing surface revision with gluteal tendon repair;  Surgeon: Melodi Lerner, Bradley;  Location: [facility];  Service: Orthopedics;  Laterality:  Left;  2 hrs   PORTACATH PLACEMENT N/A 03/15/2020   Procedure: INSERTION PORT-A-CATH WITH ULTRASOUND GUIDANCE;  Surgeon: Vanderbilt Ned, Bradley;  Location: [facility];  Service: General;  Laterality: N/A;   SIMPLE MASTECTOMY WITH AXILLARY SENTINEL NODE BIOPSY Right 03/15/2020   Procedure: RIGHT SIMPLE MASTECTOMY;  Surgeon: Vanderbilt Ned, Bradley;  Location: [facility];  Service: General;  Laterality: Right;   Social History:  reports that she has never smoked. She has never used smokeless tobacco. She reports current alcohol use. She reports that she does not use drugs.  Allergies[1]  Family History  Problem Relation Age of Onset   Skin cancer Mother    Hyperlipidemia Father    Dementia Father    Skin cancer Father    Heart attack Father    Diabetes Brother    Hypertension Brother    Breast cancer Maternal Aunt 10   Breast cancer Maternal Aunt        dx. mid-70s   Cancer Paternal Aunt        unknown type, dx. >50   Lung cancer Paternal Uncle    Multiple myeloma Paternal Uncle    Brain cancer Cousin        dx. 41s, paternal cousin   Arthritis Other    Hypertension Other    Cancer Other        2 aunts   Colon cancer Neg Hx    Esophageal cancer Neg Hx    Pancreatic cancer Neg Hx    Stomach cancer Neg Hx     Prior to Admission medications  Medication Sig Start Date End Date Taking? Authorizing Provider  ALPRAZolam  (XANAX ) 0.25 MG tablet TAKE 1/2 TABLET BY MOUTH AT BEDTIME AS NEEDED FOR INSOMNIA. 12/14/24   [name], Bradley ORN, Bradley  amLODipine  (NORVASC ) 5 MG tablet Take 1 tablet (5 mg total) by mouth daily. 10/05/24   [name], Bradley ORN, Bradley  amoxicillin -clavulanate (AUGMENTIN ) 875-125 MG tablet Take 1 tablet by mouth 2 (two) times daily. 11/28/24   Anice Riis, DO  BREO ELLIPTA 100-25 MCG/ACT AEPB Inhale 1 puff into the lungs daily.    Provider, Historical, Bradley  CALCIUM-VITAMIN D PO Take 2 tablets by mouth daily.    Provider, Historical, Bradley  citalopram   (CELEXA ) 20 MG tablet TAKE 1 TABLET BY MOUTH EVERY DAY 05/16/24   [name], Bradley ORN, Bradley  EPIPEN  2-PAK 0.3 MG/0.3ML SOAJ injection Inject 0.3 mg into the muscle as needed for anaphylaxis.  03/08/14   Provider, Historical, Bradley  fluticasone  (FLONASE ) 50 MCG/ACT nasal spray Place 2 sprays into both nostrils 2 (two) times daily. 06/17/24   Soldatova, Liuba, Bradley  Fluticasone  Furoate-Vilanterol (BREO ELLIPTA IN) Inhale 1 puff into the lungs daily.    Provider, Historical, Bradley  gabapentin  (NEURONTIN ) 100 MG capsule Take 1 capsule (100 mg total) by mouth 2 (two) times daily. In morning and afternoon. Ok to take in addition with gabapentin  300 mg nightly 11/25/24   [name], Bradley  gabapentin  (NEURONTIN ) 300 MG capsule TAKE 1 CAPSULE BY MOUTH EVERYDAY AT BEDTIME 05/11/24   Iruku, Praveena, Bradley  hydrochlorothiazide  (MICROZIDE ) 12.5  MG capsule Take 1 capsule (12.5 mg total) by mouth daily. 10/05/24   [name], Bradley ORN, Bradley  levalbuterol (XOPENEX HFA) 45 MCG/ACT inhaler 2 puffs.    Provider, Historical, Bradley  levothyroxine  (SYNTHROID ) 50 MCG tablet Take 1 tablet (50 mcg total) by mouth daily. 10/05/24   [name], Bradley ORN, Bradley  meloxicam  (MOBIC ) 15 MG tablet TAKE 1 TABLET BY MOUTH EVERY DAY 11/04/24   [name], Bradley ORN, Bradley  pantoprazole  (PROTONIX ) 40 MG tablet Take 1 tablet (40 mg total) by mouth 2 (two) times daily before a meal. 11/28/24   Anice Riis, DO  polyethylene glycol (MIRALAX  / GLYCOLAX ) 17 g packet Take 17 g by mouth every other day.    Provider, Historical, Bradley  potassium chloride  (KLOR-CON  10) 10 MEQ tablet Take two tablets once daily 12/09/24   [name], Bradley ORN, Bradley  tamoxifen  (NOLVADEX ) 20 MG tablet TAKE 1 TABLET BY MOUTH EVERY DAY 10/05/24   Iruku, Praveena, Bradley  prochlorperazine  (COMPAZINE ) 10 MG tablet Take 1 tablet (10 mg total) by mouth every 6 (six) hours as needed (Nausea or vomiting). 04/01/20 07/03/20  [name], Sandria BROCKS, Bradley    Physical Exam: Physical exam done at bedside by [facility]  [place]-RN-during this encounter. Vitals:   12/15/24 0645 12/15/24 0658 12/15/24 0700 12/15/24 0820  BP: (!) 107/52 (!) 116/55 (!) 116/55 (!) 121/58  Pulse: (!) 57 65 67 64  Resp: 18 17 20 19   Temp:  98.1 F (36.7 C)    TempSrc:  Oral    SpO2: 99% 97% 97% 99%   Gen Exam:Alert awake-not in any distress-easily speaking in full sentences-no accessory muscle use. HEENT:atraumatic, normocephalic Chest: Moving air well-wheezing heard in all lung zones CVS:S1S2 regular Abdomen:soft non tender, non distended Extremities:no edema Neurology: Non focal Skin: no rash   Data Reviewed:     Latest Ref Rng & Units 12/14/2024   12:23 PM 05/23/2024   12:22 PM 05/14/2022   12:40 PM  CBC  WBC 4.0 - 10.5 K/uL 9.1  6.8  5.9   Hemoglobin 12.0 - 15.0 g/dL 87.3  87.4  86.8   Hematocrit 36.0 - 46.0 % 37.2  37.2  39.7   Platelets 150 - 400 K/uL 293  304  299         Latest Ref Rng & Units 12/15/2024    6:58 AM 12/14/2024   12:23 PM 12/07/2024    3:04 PM  BMP  Glucose 70 - 99 mg/dL 870  834  92   BUN 8 - 23 mg/dL 11  10  14    Creatinine 0.44 - 1.00 mg/dL 9.04  8.86  8.78   Sodium 135 - 145 mmol/L 132  138  137   Potassium 3.5 - 5.1 mmol/L 4.2  3.1  3.3   Chloride 98 - 111 mmol/L 96  98  98   CO2 22 - 32 mmol/L 27  27  32   Calcium 8.9 - 10.3 mg/dL 9.7  9.6  9.1    Chest Xray: No PNA  CTA Chest: No PE-mucus plugging  12 Lead EKD : NSR  COVID/influenza/RSV PCR: Negative  Assessment and Plan: Acute on chronic bronchitis Slowly improving with treatments in the ED Will continue with IV steroids/bronchodilators/empiric antibiotics Needs incentive spirometry/flutter valve Continue oxygen  as previous-currently stable on just 2 L of oxygen .  Chronic cough Reviewed last PCCM note-plans are for PFTs-possible bronchoscopy She has had significant workup with ENT-without any significant relief Will provide antitussives as needed Continue gabapentin  Recently seen by  pulmonology-PFTs/CT chest was  planned-bronchoscopy being contemplated. Will request PCCM evaluation when she arrives at [facility].  HTN BP stable Amlodipine /HCTZ  Hypothyroidism Synthroid   GERD PPI  History of breast cancer (s/p right breast mastectomy 2021-adjuvant chemotherapy completed in 2021-currently on tamoxifen ) Continue tamoxifen  Follow-up with oncology as previously scheduled as an outpatient  Mood disorder Continue as needed Xanax  Hold SSRI-due to prolonged QTc  Prolonged QTc: Telemetry monitoring Keep K>4,Mg>2 Repeat twelve-lead EKG in the morning Avoid QTc prolonging agents   Advance Care Planning:   Code Status: Full Code   Consults: None  Family Communication: None at bedside  Severity of Illness: The appropriate patient status for this patient is INPATIENT. Inpatient status is judged to be reasonable and necessary in order to provide the required intensity of service to ensure the patient's safety. The patient's presenting symptoms, physical exam findings, and initial radiographic and laboratory data in the context of their chronic comorbidities is felt to place them at high risk for further clinical deterioration. Furthermore, it is not anticipated that the patient will be medically stable for discharge from the hospital within 2 midnights of admission.   * I certify that at the point of admission it is my clinical judgment that the patient will require inpatient hospital care spanning beyond 2 midnights from the point of admission due to high intensity of service, high risk for further deterioration and high frequency of surveillance required.*  Author: Donalda Applebaum, Bradley 12/15/2024 9:05 AM  For on call review [email or web address].      [1]  Allergies Allergen Reactions   Codeine Sulfate Nausea And Vomiting   Erythromycin Base Other (See Comments)    Stomach ache   Esomeprazole Magnesium      REACTION: GI upset   "

## 2024-12-15 NOTE — Consult Note (Signed)
 "  NAME:  Theresa Bradley, MRN:  [id number], DOB:  1951/03/23, LOS: 0 ADMISSION DATE:  12/14/2024,   History of Present Illness:   HPI: Theresa Bradley is a 74 y.o. female with medical history significant of hypothyroidism, hypertension, GERD, history of breast cancer, chronic cough-who presented to the ED with worsening of chronic cough and shortness of breath.  She has chronic cough for years, and she has been evaluated by ENT, sent to a voice clinic, on PPI for acid reflux wo improvement of her cough. She follows an allergist and she tried Breo without improvement. This time she was admitted for shortness of breath, wheezing, improve with nebulizer and prednisone .  CT scan showed minimal mucus plugging , no airspace disease.   Currently patient is feeling better at [facility]. No oxygen  requirement.  Pertinent  Medical History   Past Medical History:  Diagnosis Date   Anxiety    Breast cancer (HCC) 2009   right   Breast cancer (HCC) 2021   Cancer (HCC)    COUGH, CHRONIC 05/10/2010   Dysrhythmia    occationally skips a beat   Family history of brain cancer    Family history of breast cancer    Family history of lung cancer    Family history of multiple myeloma    Family history of skin cancer    GERD (gastroesophageal reflux disease)    HYPERTENSION 04/16/2009   HYPOTHYROIDISM 04/16/2009   OSTEOARTHRITIS 04/16/2009   Personal history of chemotherapy 2021   Personal history of radiation therapy 2009   SENILE LENTIGO 06/10/2010    Objective    Blood pressure 124/68, pulse 60, temperature 98.5 F (36.9 C), temperature source Oral, resp. rate 20, SpO2 95%.        Intake/Output Summary (Last 24 hours) at 12/15/2024 1409 Last data filed at 12/15/2024 0810 Gross per 24 hour  Intake 44.77 ml  Output --  Net 44.77 ml   There were no vitals filed for this visit.  Examination: Physical Exam Constitutional:      Appearance: Normal appearance.  HENT:     Head: Normocephalic.   Eyes:     Extraocular Movements: Extraocular movements intact.     Pupils: Pupils are equal, round, and reactive to light.  Cardiovascular:     Rate and Rhythm: Normal rate and regular rhythm.  Pulmonary:     Effort: Pulmonary effort is normal.     Breath sounds: Normal breath sounds.     Comments: No wheezing, no ronchus Abdominal:     General: Abdomen is flat.     Palpations: Abdomen is soft.  Musculoskeletal:        General: Normal range of motion.  Neurological:     General: No focal deficit present.     Mental Status: She is alert and oriented to person, place, and time.    Resolved problem list   Assessment and Plan   Possible new asthma flare Chronic cough Patient with chronic cough for years, who had new onset of wheezing , shortness of breath indicating hyperreactive airway. Improve after steroids and nebulizer. Currently without wheezing, at RA. CT scan was normal without PE, no airspace disease. -At discharge start symbicort 80mcg, 2 puffs twice a day, please provide a spacer,  Albuterol  prn, prednisone  taper for 7 days and doxycycline  for 7 days. -She can follow up with Dr. Kassie in 2 weeks.  -I will sign off  Pulmonary nodules History of breast cancer Following as outpatient with CT  scan per primary pulm, small nodules of 6 mm.   Patient  Labs   CBC: Recent Labs  Lab 12/14/24 1223 12/15/24 1227  WBC 9.1 18.9*  NEUTROABS 3.3  --   HGB 12.6 11.8*  HCT 37.2 34.3*  MCV 89.4 88.4  PLT 293 299    Basic Metabolic Panel: Recent Labs  Lab 12/14/24 1223 12/15/24 0658 12/15/24 1227  NA 138 132*  --   K 3.1* 4.2  --   CL 98 96*  --   CO2 27 27  --   GLUCOSE 165* 129*  --   BUN 10 11  --   CREATININE 1.13* 0.95 1.05*  CALCIUM 9.6 9.7  --   MG 1.9  --   --    GFR: CrCl cannot be calculated (Unknown ideal weight.). Recent Labs  Lab 12/14/24 1223 12/15/24 1227  WBC 9.1 18.9*    Liver Function Tests: No results for input(s): AST, ALT,  ALKPHOS, BILITOT, PROT, ALBUMIN in the last 168 hours. No results for input(s): LIPASE, AMYLASE in the last 168 hours. No results for input(s): AMMONIA in the last 168 hours.  ABG No results found for: PHART, PCO2ART, PO2ART, HCO3, TCO2, ACIDBASEDEF, O2SAT   Coagulation Profile: No results for input(s): INR, PROTIME in the last 168 hours.  Cardiac Enzymes: No results for input(s): CKTOTAL, CKMB, CKMBINDEX, TROPONINI in the last 168 hours.  HbA1C: Hgb A1c MFr Bld  Date/Time Value Ref Range Status  11/27/2022 10:12 AM 5.7 4.6 - 6.5 % Final    Comment:    Glycemic Control Guidelines for People with Diabetes:Non Diabetic:  <6%Goal of Therapy: <7%Additional Action Suggested:  >8%   02/22/2021 08:45 AM 5.7 4.6 - 6.5 % Final    Comment:    Glycemic Control Guidelines for People with Diabetes:Non Diabetic:  <6%Goal of Therapy: <7%Additional Action Suggested:  >8%     CBG: No results for input(s): GLUCAP in the last 168 hours.  Review of Systems:   As above  Past Medical History:  She,  has a past medical history of Anxiety, Breast cancer (HCC) (2009), Breast cancer (HCC) (2021), Cancer (HCC), COUGH, CHRONIC (05/10/2010), Dysrhythmia, Family history of brain cancer, Family history of breast cancer, Family history of lung cancer, Family history of multiple myeloma, Family history of skin cancer, GERD (gastroesophageal reflux disease), HYPERTENSION (04/16/2009), HYPOTHYROIDISM (04/16/2009), OSTEOARTHRITIS (04/16/2009), Personal history of chemotherapy (2021), Personal history of radiation therapy (2009), and SENILE LENTIGO (06/10/2010).   Surgical History:   Past Surgical History:  Procedure Laterality Date   BREAST EXCISIONAL BIOPSY Right    BREAST EXCISIONAL BIOPSY Left    BREAST LUMPECTOMY Right 2009   radiation only   BREAST LUMPECTOMY WITH RADIOACTIVE SEED AND SENTINEL LYMPH NODE BIOPSY Right 01/31/2020   Procedure: RIGHT BREAST LUMPECTOMY  WITH RADIOACTIVE SEED AND SENTINEL LYMPH NODE BIOPSY;  Surgeon: Vanderbilt Ned, MD;  Location: [facility];  Service: General;  Laterality: Right;  PEC BLOCK   BREAST SURGERY  2009   X 2, cancer stage 0, lumpectomy   CESAREAN SECTION     1979, 1982   CHOLECYSTECTOMY     COLONOSCOPY     DILATATION & CURETTAGE/HYSTEROSCOPY WITH MYOSURE  11/2015   HERNIA REPAIR  2009   JOINT REPLACEMENT  2008   hip   MASTECTOMY Right 2021   OPEN SURGICAL REPAIR OF GLUTEAL TENDON Left 07/18/2019   Procedure: Left hip bearing surface revision with gluteal tendon repair;  Surgeon: Melodi Lerner, MD;  Location: [facility];  Service: Orthopedics;  Laterality: Left;  2 hrs   PORTACATH PLACEMENT N/A 03/15/2020   Procedure: INSERTION PORT-A-CATH WITH ULTRASOUND GUIDANCE;  Surgeon: Vanderbilt Ned, MD;  Location: [facility];  Service: General;  Laterality: N/A;   SIMPLE MASTECTOMY WITH AXILLARY SENTINEL NODE BIOPSY Right 03/15/2020   Procedure: RIGHT SIMPLE MASTECTOMY;  Surgeon: Vanderbilt Ned, MD;  Location: [facility];  Service: General;  Laterality: Right;     Social History:   reports that she has never smoked. She has never used smokeless tobacco. She reports current alcohol use. She reports that she does not use drugs.   Family History:  Her family history includes Arthritis in an other family member; Brain cancer in her cousin; Breast cancer in her maternal aunt; Breast cancer (age of onset: 40) in her maternal aunt; Cancer in her paternal aunt and another family member; Dementia in her father; Diabetes in her brother; Heart attack in her father; Hyperlipidemia in her father; Hypertension in her brother and another family member; Lung cancer in her paternal uncle; Multiple myeloma in her paternal uncle; Skin cancer in her father and mother. There is no history of Colon cancer, Esophageal cancer, Pancreatic cancer, or Stomach cancer.   Allergies Allergies[1]   Home  Medications  Prior to Admission medications  Medication Sig Start Date End Date Taking? Authorizing Provider  ALPRAZolam  (XANAX ) 0.25 MG tablet TAKE 1/2 TABLET BY MOUTH AT BEDTIME AS NEEDED FOR INSOMNIA. 12/14/24   [name], Wolm ORN, MD  amLODipine  (NORVASC ) 5 MG tablet Take 1 tablet (5 mg total) by mouth daily. 10/05/24   [name], Wolm ORN, MD  amoxicillin -clavulanate (AUGMENTIN ) 875-125 MG tablet Take 1 tablet by mouth 2 (two) times daily. 11/28/24   Anice Riis, DO  BREO ELLIPTA 100-25 MCG/ACT AEPB Inhale 1 puff into the lungs daily.    [provider]  CALCIUM-VITAMIN D PO Take 2 tablets by mouth daily.    [provider]  citalopram  (CELEXA ) 20 MG tablet TAKE 1 TABLET BY MOUTH EVERY DAY 05/16/24   [name], Wolm ORN, MD  EPIPEN  2-PAK 0.3 MG/0.3ML SOAJ injection Inject 0.3 mg into the muscle as needed for anaphylaxis.  03/08/14   [provider]  fluticasone  (FLONASE ) 50 MCG/ACT nasal spray Place 2 sprays into both nostrils 2 (two) times daily. 06/17/24   Soldatova, Liuba, MD  Fluticasone  Furoate-Vilanterol (BREO ELLIPTA IN) Inhale 1 puff into the lungs daily.    [provider]  gabapentin  (NEURONTIN ) 100 MG capsule Take 1 capsule (100 mg total) by mouth 2 (two) times daily. In morning and afternoon. Ok to take in addition with gabapentin  300 mg nightly 11/25/24   [name], MD  gabapentin  (NEURONTIN ) 300 MG capsule TAKE 1 CAPSULE BY MOUTH EVERYDAY AT BEDTIME 05/11/24   Iruku, Praveena, MD  hydrochlorothiazide  (MICROZIDE ) 12.5 MG capsule Take 1 capsule (12.5 mg total) by mouth daily. 10/05/24   [name], Wolm ORN, MD  levalbuterol (XOPENEX HFA) 45 MCG/ACT inhaler 2 puffs.    [provider]  levothyroxine  (SYNTHROID ) 50 MCG tablet Take 1 tablet (50 mcg total) by mouth daily. 10/05/24   [name], Wolm ORN, MD  meloxicam  (MOBIC ) 15 MG tablet TAKE 1 TABLET BY MOUTH EVERY DAY 11/04/24   [name], Wolm ORN, MD  pantoprazole  (PROTONIX ) 40 MG tablet  Take 1 tablet (40 mg total) by mouth 2 (two) times daily before a meal. 11/28/24   Anice Riis, DO  polyethylene glycol (MIRALAX  / GLYCOLAX ) 17 g packet Take 17  g by mouth every other day.    [provider]  potassium chloride  (KLOR-CON  10) 10 MEQ tablet Take two tablets once daily 12/09/24   [name], Wolm ORN, MD  tamoxifen  (NOLVADEX ) 20 MG tablet TAKE 1 TABLET BY MOUTH EVERY DAY 10/05/24   Iruku, Praveena, MD  prochlorperazine  (COMPAZINE ) 10 MG tablet Take 1 tablet (10 mg total) by mouth every 6 (six) hours as needed (Nausea or vomiting). 04/01/20 07/03/20  [name], Sandria BROCKS, MD       I personally spent 30 minutes providing care.  Marny Patch, MD [facility] 12/15/2024 2:09 PM                [1]  Allergies Allergen Reactions   Codeine Sulfate Nausea And Vomiting   Erythromycin Base Other (See Comments)    Stomach ache   Esomeprazole Magnesium  Other (See Comments)    GI upset   "

## 2024-12-15 NOTE — Treatment Plan (Addendum)
 Virtual Admission arrived at [name]  74 yo with hx hypothyroidism, HTN, GERD, breast cancer, chronic cough presenting with worsening cough and SOB.  On presentation to ED, was wheezing.  Not back to baseline after steroids, nebs.  She's been admitted for further evaluation and management.    CT PE protocol without PE - mucous plugging of nondilated upper lobe and lower lobe bronchi due to airways inflammatory process.  Multiple scattered pulmonary nodules (needs follow up per oncology protocol).  Labs with hyponatremia, mild hyperglycemia, mild hypochloremia.  CBC from 12/14/2024 is normal.  Negative covid, flu, RSV.    Vitals:   12/15/24 1000 12/15/24 1015  BP: (!) 128/57 (!) 122/58  Pulse: 73 69  Resp:  (!) 22  Temp:    SpO2: 100% 100%    Theresa Bradley/P Here as virtual admission being treated for acute on chronic bronchitis.  Continue steroids, abx, scheduled and prn nebs.  Pulm follow up.  See Dr. Nino note for additional details.  I'm available for any urgent/emergent or in person/bedside needs.  Please message Dr. Raenelle for all other needs.

## 2024-12-15 NOTE — ED Notes (Signed)
 Catrina at [facility] notified of transport 10:11-TC

## 2024-12-15 NOTE — ED Notes (Signed)
 George from [facility] called to confirm transport 09:45-TC

## 2024-12-15 NOTE — Progress Notes (Signed)
 Pt scheduled 0.25 Xanax . Pt states I only take half at night. 0.125 dose was given. Attending provider notified. Consulting Civil Engineer notified.

## 2024-12-15 NOTE — Plan of Care (Signed)

## 2024-12-16 ENCOUNTER — Other Ambulatory Visit (HOSPITAL_COMMUNITY): Payer: Self-pay

## 2024-12-16 DIAGNOSIS — J9601 Acute respiratory failure with hypoxia: Secondary | ICD-10-CM | POA: Diagnosis not present

## 2024-12-16 LAB — BASIC METABOLIC PANEL WITH GFR
Anion gap: 9 (ref 5–15)
BUN: 13 mg/dL (ref 8–23)
CO2: 28 mmol/L (ref 22–32)
Calcium: 9.4 mg/dL (ref 8.9–10.3)
Chloride: 97 mmol/L — ABNORMAL LOW (ref 98–111)
Creatinine, Ser: 0.95 mg/dL (ref 0.44–1.00)
GFR, Estimated: >60 mL/min
Glucose, Bld: 117 mg/dL — ABNORMAL HIGH (ref 70–99)
Potassium: 3.5 mmol/L (ref 3.5–5.1)
Sodium: 134 mmol/L — ABNORMAL LOW (ref 135–145)

## 2024-12-16 LAB — CBC
HCT: 33.2 % — ABNORMAL LOW (ref 36.0–46.0)
Hemoglobin: 11.2 g/dL — ABNORMAL LOW (ref 12.0–15.0)
MCH: 29.9 pg (ref 26.0–34.0)
MCHC: 33.7 g/dL (ref 30.0–36.0)
MCV: 88.5 fL (ref 80.0–100.0)
Platelets: 301 K/uL (ref 150–400)
RBC: 3.75 MIL/uL — ABNORMAL LOW (ref 3.87–5.11)
RDW: 12.9 % (ref 11.5–15.5)
WBC: 16.6 K/uL — ABNORMAL HIGH (ref 4.0–10.5)
nRBC: 0 % (ref 0.0–0.2)

## 2024-12-16 LAB — MAGNESIUM: Magnesium: 2.3 mg/dL (ref 1.7–2.4)

## 2024-12-16 MED ORDER — FLUTICASONE FUROATE-VILANTEROL 100-25 MCG/ACT IN AEPB: 1.0000 | INHALATION_SPRAY | Freq: Every day | RESPIRATORY_TRACT | Status: DC

## 2024-12-16 MED ORDER — DOXYCYCLINE HYCLATE 100 MG PO TABS
100.0000 mg | ORAL_TABLET | Freq: Two times a day (BID) | ORAL | 0 refills | Status: DC
  Filled 2024-12-16: qty 11, 6d supply, fill #0

## 2024-12-16 MED ORDER — SPACER/AERO-HOLDING CHAMBERS DEVI
1 refills | Status: AC
  Filled 2024-12-16: qty 1, fill #0

## 2024-12-16 MED ORDER — PREDNISONE 10 MG PO TABS
ORAL_TABLET | ORAL | 0 refills | Status: DC
  Filled 2024-12-16: qty 22, 7d supply, fill #0

## 2024-12-16 NOTE — Plan of Care (Signed)

## 2024-12-16 NOTE — Discharge Summary (Signed)
 Physician Discharge Summary  Theresa Bradley FMW:[id number] DOB: 1951-08-06 DOA: 12/14/2024  Theresa: Theresa Bradley ORN, MD  Admit date: 12/14/2024 Discharge date: 12/16/2024  Time spent: 40 minutes  Recommendations for Outpatient Follow-up:  Follow outpatient CBC/CMP  Follow with pulmonary outpatient Needs PFT's rescheduled Follow chronic cough outpatient with pulmonary Follow repeat EKG outpatient for Qtc eval  Follow pulmonary nodules with oncology outpatient   Discharge Diagnoses:  Principal Problem:   Acute respiratory failure with hypoxia Theresa Bradley)   Discharge Condition: stable  Diet recommendation: heart healthy  There were no vitals filed for this visit.  History of present illness:   Theresa Bradley is Theresa Bradley 74 y.o. female with medical history significant of hypothyroidism, hypertension, GERD, history of breast cancer, chronic cough-who presented to the ED with worsening of chronic cough and shortness of breath.   CT showed minimal mucous plugging of nondilated upper lobe and lower lobe bronchi potentially due to airways inflammatory process.  She's improved with steroids, nebs.  Discharging in stable condition on 1/23 with outpatient pulmonary follow up.   Hospital Course:  Assessment and Plan:  Acute on chronic bronchitis CT with minimal mucous plugging of nondilated upper lobe and lower lobe bronchi potentially due to airways inflammatory process Improving with steroids, nebs Will discharge with breo ellipta , albuterol , prednisone  taper, and doxycycline .     Chronic cough Reviewed last PCCM Bradley-plans are for PFTs-possible bronchoscopy She has had significant workup with Theresa- see 09/22/2024 Theresa Bradley Continue gabapentin  Recently seen by pulmonology-PFTs/CT chest was planned-bronchoscopy being contemplated if workup negative. Theresa Bradley recommended symbicort (will continue breo as she was previously on this), albuterol  prn, prednisone  taper, doxy.  Outpatient follow up  with Theresa Bradley in 2 weeks.   Pulmonary Nodules Follow with oncology outpatient   HTN Amlodipine /HCTZ   Hypothyroidism Synthroid    GERD PPI   History of breast cancer (s/p right breast mastectomy 2021-adjuvant chemotherapy completed in 2021-currently on tamoxifen ) Continue tamoxifen  Follow-up with oncology as previously scheduled as an outpatient   Mood disorder Continue home xanax , celexa    Prolonged QTc: Repeat EKG improved, follow outpatient    Procedures: none   Consultations: pulm  Discharge Exam: Vitals:   12/15/24 1635 12/15/24 2035  BP: (!) 116/52 127/60  Pulse: 92 64  Resp: 18 16  Temp: 98.2 F (36.8 C) 98.3 F (36.8 C)  SpO2: 96% 99%   Eager to discharge Chronic cough for 15 years Feels better after breathing treatments, steroids  General: No acute distress. Cardiovascular: RRR Lungs: scattered faint expiratory wheezing, unlabored - occasional coughing spells Neurological: Alert and oriented 3. Moves all extremities 4 with equal strength. Cranial nerves II through XII grossly intact. Skin: Warm and dry. No rashes or lesions. Extremities: No clubbing or cyanosis. No edema.  Discharge Instructions   Discharge Instructions     Call MD for:  difficulty breathing, headache or visual disturbances   Complete by: As directed    Call MD for:  extreme fatigue   Complete by: As directed    Call MD for:  hives   Complete by: As directed    Call MD for:  persistant dizziness or light-headedness   Complete by: As directed    Call MD for:  persistant nausea and vomiting   Complete by: As directed    Call MD for:  redness, tenderness, or signs of infection (pain, swelling, redness, odor or green/yellow discharge around incision site)   Complete by: As directed    Call MD for:  severe uncontrolled pain   Complete by: As directed    Call MD for:  temperature >100.4   Complete by: As directed    Discharge instructions   Complete by: As directed     You were admitted for bronchitis.  You've improved with steroids and breathing treatments and antibiotics.  We'll have you continue your breo as an outpatient.  We'll send you with Theresa Bradley steroid taper.  You'll need to reschedule your PFT's with pulmonology outpatient due to your treatment with steroids.  Call pulmonology for Theresa Bradley follow up appointment and rescheduling your pulmonary function tests.  You have pulmonary nodules that should be followed up as an outpatient based on oncology recommendations (you'll need repeat imaging).   Return for new, recurrent, or worsening symptoms.  Please ask your Theresa to request records from this hospitalization so they know what was done and what the next steps will be.   Increase activity slowly   Complete by: As directed       Allergies as of 12/16/2024       Reactions   Codeine Sulfate Nausea And Vomiting   Erythromycin Base Other (See Comments)   Stomach ache   Esomeprazole Magnesium  Other (See Comments)   GI upset   Pantoprazole  Diarrhea        Medication List     STOP taking these medications    amoxicillin -clavulanate 875-125 MG tablet Commonly known as: AUGMENTIN        TAKE these medications    ALPRAZolam  0.25 MG tablet Commonly known as: XANAX  TAKE 1/2 TABLET BY MOUTH AT BEDTIME AS NEEDED FOR INSOMNIA. What changed: See the new instructions.   amLODipine  5 MG tablet Commonly known as: NORVASC  Take 1 tablet (5 mg total) by mouth daily.   CALCIUM + D3 PO Take 1 tablet by mouth daily with breakfast.   citalopram  20 MG tablet Commonly known as: CELEXA  TAKE 1 TABLET BY MOUTH EVERY DAY   doxycycline  100 MG tablet Commonly known as: VIBRA -TABS Take 1 tablet (100 mg total) by mouth every 12 (twelve) hours for 11 doses.   EpiPen  2-Pak 0.3 MG/0.3ML Soaj injection Generic drug: EPINEPHrine  Inject 0.3 mg into the muscle as needed for anaphylaxis.   fluticasone  50 MCG/ACT nasal spray Commonly known as: FLONASE  Place 2  sprays into both nostrils 2 (two) times daily. What changed: when to take this   fluticasone  furoate-vilanterol 100-25 MCG/ACT Aepb Commonly known as: Breo Ellipta  Inhale 1 puff into the lungs daily.   gabapentin  300 MG capsule Commonly known as: NEURONTIN  TAKE 1 CAPSULE BY MOUTH EVERYDAY AT BEDTIME What changed: See the new instructions.   gabapentin  100 MG capsule Commonly known as: Neurontin  Take 1 capsule (100 mg total) by mouth 2 (two) times daily. In morning and afternoon. Ok to take in addition with gabapentin  300 mg nightly What changed:  when to take this additional instructions   hydrochlorothiazide  12.5 MG capsule Commonly known as: MICROZIDE  Take 1 capsule (12.5 mg total) by mouth daily.   levalbuterol 45 MCG/ACT inhaler Commonly known as: XOPENEX HFA Inhale 2 puffs into the lungs every 6 (six) hours as needed for shortness of breath or wheezing.   levothyroxine  50 MCG tablet Commonly known as: SYNTHROID  Take 1 tablet (50 mcg total) by mouth daily. What changed: when to take this   meloxicam  15 MG tablet Commonly known as: MOBIC  TAKE 1 TABLET BY MOUTH EVERY DAY   pantoprazole  40 MG tablet Commonly known as: PROTONIX  Take 1 tablet (40 mg total)  by mouth 2 (two) times daily before Herschel Fleagle meal.   polyethylene glycol 17 g packet Commonly known as: MIRALAX  / GLYCOLAX  Take 17 g by mouth every other day.   potassium chloride  10 MEQ tablet Commonly known as: Klor-Con  10 Take two tablets once daily What changed:  how much to take how to take this when to take this additional instructions   predniSONE  10 MG tablet Commonly known as: DELTASONE  Take 4 tablets (40 mg total) by mouth daily with breakfast for 3 days, THEN 3 tablets (30 mg total) daily with breakfast for 2 days, THEN 2 tablets (20 mg total) daily with breakfast for 2 days. Start taking on: December 16, 2024   sodium chloride  0.65 % Soln nasal spray Commonly known as: OCEAN Place 1 spray into both  nostrils as needed for congestion.   tamoxifen  20 MG tablet Commonly known as: NOLVADEX  TAKE 1 TABLET BY MOUTH EVERY DAY       Allergies[1]    The results of significant diagnostics from this hospitalization (including imaging, microbiology, ancillary and laboratory) are listed below for reference.    Significant Diagnostic Studies: CT Angio Chest PE W and/or Wo Contrast Result Date: 12/14/2024 CLINICAL DATA:  Hypoxia short of breath EXAM: CT ANGIOGRAPHY CHEST WITH CONTRAST TECHNIQUE: Multidetector CT imaging of the chest was performed using the standard protocol during bolus administration of intravenous contrast. Multiplanar CT image reconstructions and MIPs were obtained to evaluate the vascular anatomy. RADIATION DOSE REDUCTION: This exam was performed according to the departmental dose-optimization program which includes automated exposure control, adjustment of the mA and/or kV according to patient size and/or use of iterative reconstruction technique. CONTRAST:  OMNIPAQUE  IOHEXOL  350 MG/ML SOLN COMPARISON:  Chest x-ray 12/14/2024, chest CT 12/12/2024 FINDINGS: Cardiovascular: Satisfactory opacification of the pulmonary arteries to the segmental level. No evidence of pulmonary embolism. Nonaneurysmal aorta. No dissection. Mild atherosclerosis. Normal cardiac size. No pericardial effusion. Mild coronary vascular calcification Mediastinum/Nodes: Patent trachea. No thyroid  mass. No suspicious lymph nodes. Esophagus within normal limits. Lungs/Pleura: No acute airspace disease, pleural effusion or pneumothorax. Minimal mucous plugging of nondilated upper lobe and lower lobe bronchi. Multiple scattered pulmonary nodules, for example 5 mm lingular nodule on series 6, image 85. Right middle lobe 6 mm pulmonary nodule on series 6, image 63. Anterior right lung base 4 mm pulmonary nodule on series 6, image 93. Vague right lower lobe 6 mm nodular focus on series 6, image 74. Upper Abdomen: No  acute finding.  10 mm gastrohepatic node. Musculoskeletal: Right mastectomy.  No acute osseous abnormality Review of the MIP images confirms the above findings. IMPRESSION: 1. Negative for acute pulmonary embolus or aortic dissection. 2. Minimal mucous plugging of nondilated upper lobe and lower lobe bronchi potentially due to airways inflammatory process. No acute airspace disease. 3. Multiple scattered pulmonary nodules measuring up to 6 mm. Follow-up according to oncologic protocol recommended given history of breast cancer 4. Aortic atherosclerosis. Aortic Atherosclerosis (ICD10-I70.0). Electronically Signed   By: Luke Bun M.D.   On: 12/14/2024 18:00   CT Chest Wo Contrast Result Date: 12/14/2024 CLINICAL DATA:  Chronic cough. EXAM: CT CHEST WITHOUT CONTRAST TECHNIQUE: Multidetector CT imaging of the chest was performed following the standard protocol without IV contrast. RADIATION DOSE REDUCTION: This exam was performed according to the departmental dose-optimization program which includes automated exposure control, adjustment of the mA and/or kV according to patient size and/or use of iterative reconstruction technique. COMPARISON:  None Available. FINDINGS: Cardiovascular: Atherosclerotic calcification of the  aorta, aortic valve and coronary arteries. Enlarged pulmonic trunk and heart. No pericardial effusion. Mediastinum/Nodes: Thoracic inlet lymph nodes are not enlarged by CT size criteria. No pathologically enlarged mediastinal or axillary lymph nodes. Hilar regions are difficult to definitively evaluate without IV contrast. Right mastectomy. Air in the esophagus can be seen with dysmotility. Lungs/Pleura: Subpleural radiation scarring in the anterior right lung. High density subpleural nodule along the minor fissure, indicative of Chana Lindstrom granuloma. Johannah Rozas few scattered pulmonary nodules measure up to 6 mm in the right lower lobe (3/94). No pleural fluid. Airway is unremarkable. Upper Abdomen:  Cholecystectomy. 10 mm gastrohepatic ligament lymph node. Visualized portions of the liver, adrenal glands, kidneys, spleen, pancreas, stomach and bowel are otherwise grossly unremarkable. No upper abdominal adenopathy. Musculoskeletal: Degenerative changes in the spine. IMPRESSION: 1. No pulmonary parenchymal findings to explain the patient's cough. 2. Scattered pulmonary nodules measure 6 mm or less in size. Although not particularly concerning, given breast cancer history, follow-up according to oncologic protocols is recommended. 3. Aortic atherosclerosis (ICD10-I70.0). Coronary artery calcification. 4. Enlarged pulmonic trunk, indicative of pulmonary arterial hypertension. Electronically Signed   By: Newell Eke M.D.   On: 12/14/2024 14:06   DG Chest Portable 1 View Result Date: 12/14/2024 CLINICAL DATA:  Cough. EXAM: PORTABLE CHEST 1 VIEW COMPARISON:  Apr 12, 2021 FINDINGS: Sharene Krikorian right-sided venous Port-Renato Spellman-Cath seen on the prior study has been removed. The heart size and mediastinal contours are within normal limits. No acute infiltrate, pleural effusion or pneumothorax is identified. Radiopaque surgical clips are seen overlying the right upper quadrant. The visualized skeletal structures are unremarkable. IMPRESSION: No active disease. Electronically Signed   By: Suzen Dials M.D.   On: 12/14/2024 12:49    Microbiology: Recent Results (from the past 240 hours)  Resp panel by RT-PCR (RSV, Flu Jolyssa Oplinger&B, Covid) Anterior Nasal Swab     Status: None   Collection Time: 12/14/24  1:20 PM   Specimen: Anterior Nasal Swab  Result Value Ref Range Status   SARS Coronavirus 2 by RT PCR NEGATIVE NEGATIVE Final    Comment: (Bradley) SARS-CoV-2 target nucleic acids are NOT DETECTED.  The SARS-CoV-2 RNA is generally detectable in upper respiratory specimens during the acute phase of infection. The lowest concentration of SARS-CoV-2 viral copies this assay can detect is 138 copies/mL. Brandelyn Henne negative result does not  preclude SARS-Cov-2 infection and should not be used as the sole basis for treatment or other patient management decisions. Delaney Schnick negative result may occur with  improper specimen collection/handling, submission of specimen other than nasopharyngeal swab, presence of viral mutation(s) within the areas targeted by this assay, and inadequate number of viral copies(<138 copies/mL). Xiao Graul negative result must be combined with clinical observations, patient history, and epidemiological information. The expected result is Negative.  Fact Sheet for Patients:  bloggercourse.com  Fact Sheet for Healthcare Providers:  seriousbroker.it  This test is no t yet approved or cleared by the [place]  FDA and  has been authorized for detection and/or diagnosis of SARS-CoV-2 by FDA under an Emergency Use Authorization (EUA). This EUA will remain  in effect (meaning this test can be used) for the duration of the COVID-19 declaration under Section 564(b)(1) of the Act, 21 U.S.C.section 360bbb-3(b)(1), unless the authorization is terminated  or revoked sooner.       Influenza Jaliana [place] by PCR NEGATIVE NEGATIVE Final   Influenza B by PCR NEGATIVE NEGATIVE Final    Comment: (Bradley) The Xpert Xpress SARS-CoV-2/FLU/RSV plus assay is intended as an aid in  the diagnosis of influenza from Nasopharyngeal swab specimens and should not be used as Coyle Stordahl sole basis for treatment. Nasal washings and aspirates are unacceptable for Xpert Xpress SARS-CoV-2/FLU/RSV testing.  Fact Sheet for Patients: bloggercourse.com  Fact Sheet for Healthcare Providers: seriousbroker.it  This test is not yet approved or cleared by the [place]  FDA and has been authorized for detection and/or diagnosis of SARS-CoV-2 by FDA under an Emergency Use Authorization (EUA). This EUA will remain in effect (meaning this test can be used) for the  duration of the COVID-19 declaration under Section 564(b)(1) of the Act, 21 U.S.C. section 360bbb-3(b)(1), unless the authorization is terminated or revoked.     Resp Syncytial Virus by PCR NEGATIVE NEGATIVE Final    Comment: (Bradley) Fact Sheet for Patients: bloggercourse.com  Fact Sheet for Healthcare Providers: seriousbroker.it  This test is not yet approved or cleared by the [place]  FDA and has been authorized for detection and/or diagnosis of SARS-CoV-2 by FDA under an Emergency Use Authorization (EUA). This EUA will remain in effect (meaning this test can be used) for the duration of the COVID-19 declaration under Section 564(b)(1) of the Act, 21 U.S.C. section 360bbb-3(b)(1), unless the authorization is terminated or revoked.  Performed at [facility], [address] [place], [place] [address]      Labs: Basic Metabolic Panel: Recent Labs  Lab 12/14/24 1223 12/15/24 0658 12/15/24 1227 12/16/24 0645  NA 138 132*  --  134*  K 3.1* 4.2  --  3.5  CL 98 96*  --  97*  CO2 27 27  --  28  GLUCOSE 165* 129*  --  117*  BUN 10 11  --  13  CREATININE 1.13* 0.95 1.05* 0.95  CALCIUM 9.6 9.7  --  9.4  MG 1.9  --   --  2.3   Liver Function Tests: No results for input(s): AST, ALT, ALKPHOS, BILITOT, PROT, ALBUMIN in the last 168 hours. No results for input(s): LIPASE, AMYLASE in the last 168 hours. No results for input(s): AMMONIA in the last 168 hours. CBC: Recent Labs  Lab 12/14/24 1223 12/15/24 1227 12/16/24 0645  WBC 9.1 18.9* 16.6*  NEUTROABS 3.3  --   --   HGB 12.6 11.8* 11.2*  HCT 37.2 34.3* 33.2*  MCV 89.4 88.4 88.5  PLT 293 299 301   Cardiac Enzymes: No results for input(s): CKTOTAL, CKMB, CKMBINDEX, TROPONINI in the last 168 hours. BNP: BNP (last 3 results) No results for input(s): BNP in the last 8760 hours.  ProBNP (last 3 results) No results  for input(s): PROBNP in the last 8760 hours.  CBG: No results for input(s): GLUCAP in the last 168 hours.     Signed:  Meliton Monte MD.  [facility] 12/16/2024, 10:53 AM       [1]  Allergies Allergen Reactions   Codeine Sulfate Nausea And Vomiting   Erythromycin Base Other (See Comments)    Stomach ache   Esomeprazole Magnesium  Other (See Comments)    GI upset   Pantoprazole  Diarrhea

## 2024-12-16 NOTE — Progress Notes (Signed)
" °   12/16/24 0850  TOC Brief Assessment  Insurance and Status Reviewed  Patient has primary care physician Yes Zulma, Wolm ORN, MD)  Home environment has been reviewed from home  Prior level of function: independent  Prior/Current Home Services No current home services  Social Drivers of Health Review SDOH reviewed no interventions necessary  Readmission risk has been reviewed Yes  Transition of care needs no transition of care needs at this time    "

## 2024-12-16 NOTE — Progress Notes (Signed)
 Discharge medications delivered to patient at the bedside in a secure bag.

## 2024-12-16 NOTE — Telephone Encounter (Signed)
 Called patient. She was hospitalized from 12/14/24-12/16/24. Pulmonary consulted for possible asthma and treating with prednisone , inhaler and doxycycline . Discharged with symbicort but patient has not tolerated in the past. Advised for her to continue Breo and remaining discharge meds. Will evaluate with PFTs at pulm visit next week.

## 2024-12-16 NOTE — Progress Notes (Signed)
 Discharge instructions were reviewed with the patient. She denied questions, or concerns regarding instruction. IV removed, site is clean dry and intact.Telemetry box #51 returned to the desk.

## 2024-12-19 ENCOUNTER — Telehealth: Payer: Self-pay

## 2024-12-19 NOTE — Transitions of Care (Post Inpatient/ED Visit) (Signed)
" ° °  12/19/2024  Name: Theresa Bradley MRN: [id number] DOB: [birth year]  Today's TOC FU Call Status: Today's TOC FU Call Status:: Successful TOC FU Call Completed TOC FU Call Complete Date: 12/19/24  Patient's Name and Date of Birth confirmed. Name, DOB  Transition Care Management Follow-up Telephone Call Date of Discharge: 12/16/24 Discharge Facility: [facility]) Type of Discharge: Inpatient Admission Primary Inpatient Discharge Diagnosis:: Acute respiratory failure with hypoxia How have you been since you were released from the hospital?: Better Any questions or concerns?: No  Items Reviewed: Did you receive and understand the discharge instructions provided?: Yes Medications obtained,verified, and reconciled?: Yes (Medications Reviewed)  Medications Reviewed Today: verbally states that she has all her medications- declined review Medications Reviewed Today   Medications were not reviewed in this encounter     Placed call to patient for [facility] call. Patient reports that she has her instructions and she has read over them carefully.. Reports no questions and declines TOC call and review. Encouraged patient to see PCP, Oncology and Pulmonary. She reports that she has those phone numbers and will call.     Alan Ee, RN, BSN, CEN Population Health- Transition of Care Team.  [facility] [phone]  "

## 2024-12-21 ENCOUNTER — Encounter: Payer: Self-pay | Admitting: Hematology and Oncology

## 2024-12-21 ENCOUNTER — Other Ambulatory Visit: Payer: Self-pay | Admitting: Hematology and Oncology

## 2024-12-21 DIAGNOSIS — C50411 Malignant neoplasm of upper-outer quadrant of right female breast: Secondary | ICD-10-CM

## 2024-12-21 NOTE — Progress Notes (Unsigned)
 " [facility]  Telephone:([phone] Fax:([phone]     ID: Theresa Bradley DOB: [birth year]  MR#: [id number]  RDW#:[id number]  Patient Care Team: Theresa Bradley ORN, MD as PCP - General Theresa Bradley, Theresa BROCKS, MD as Consulting Physician (Allergy and Immunology) Theresa Bradley, OD as Referring Physician Theresa Ned, MD as Consulting Physician (General Surgery) Theresa Lauraine, MD as Attending Physician (Radiation Oncology) Theresa Pulling, MD as Referring Physician (Dermatology) Theresa Ezra RAMAN, MD as Consulting Physician (Cardiology) Theresa Molly, MD as Consulting Physician (Ophthalmology) Theresa Stalls, MD  CHIEF COMPLAINT: 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  History of Present Illness Theresa Bradley is a 74 year old female with breast cancer on tamoxifen  who presents for a follow-up visit.  She is currently taking tamoxifen  and gabapentin . Gabapentin  has been effective in managing her hot flashes and also aids in deep sleep. She is concerned about the process of discontinuing tamoxifen , having heard from an acquaintance that it can be challenging, although she has not experienced any issues herself.  She has been experiencing symptoms resembling a cold since December, which have persisted despite two courses of antibiotics. Symptoms include significant drainage and occasional sore throat. A chest x-ray was performed and was normal. She plans to see an ENT specialist for further evaluation.  She has noticed some white spots on her gums, which her dentist is monitoring. The dentist mentioned the possibility of a biopsy if changes occur, but no changes have been noted so far. She plans to discuss this with the ENT specialist as well.  She reports a decrease  in appetite and has lost weight over the past couple of years, from 154 pounds in 2023 to 142 pounds currently. She attributes this to her reduced appetite.  Rest of the pertinent 10 point ROS reviewed and neg.   COVID 19 VACCINATION STATUS: Status post Pfizer x2+ booster in October 2021   HISTORY OF CURRENT ILLNESS: From the original intake note:   Theresa Bradley was my patient a little over 11 years ago when she underwent lumpectomy on 05/15/2008 for ductal carcinoma in situ and received radiation therapy under Dr. Jason. 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  More recently, she presented to her PCP with a small palpable right breast lump around 11 o'clock, just superior and lateral to the nipple. She underwent bilateral diagnostic mammography with tomography and right breast ultrasonography at [facility] on 12/26/2019 showing: breast density category B; 6 mm superficial mass in the right breast at 11 o'clock in the retroareolar region; no enlarged adenopathy in right axilla.  Accordingly on 12/30/2019 she proceeded to biopsy of the right breast area in question. The pathology from this procedure ([id number]) showed: invasive ductal carcinoma, grade 2. Prognostic indicators significant for: estrogen receptor, 95% positive and progesterone receptor, 80% positive, both with strong staining intensity. Proliferation marker Ki67 at 20%. HER2 positive by immunohistochemistry (3+).  The patient's subsequent history is as detailed below.   PAST MEDICAL HISTORY: Past Medical History:  Diagnosis Date   Anxiety    Breast cancer (HCC) 2009   right   Breast cancer (HCC) 2021   Cancer (HCC)    COUGH,  CHRONIC 05/10/2010   Dysrhythmia    occationally skips a beat   Family history of brain cancer    Family history of breast cancer    Family history of lung cancer    Family history of  multiple myeloma    Family history of skin cancer    GERD (gastroesophageal reflux disease)    HYPERTENSION 04/16/2009   HYPOTHYROIDISM 04/16/2009   OSTEOARTHRITIS 04/16/2009   Personal history of chemotherapy 2021   Personal history of radiation therapy 2009   SENILE LENTIGO 06/10/2010    PAST SURGICAL HISTORY: Past Surgical History:  Procedure Laterality Date   BREAST EXCISIONAL BIOPSY Right    BREAST EXCISIONAL BIOPSY Left    BREAST LUMPECTOMY Right 2009   radiation only   BREAST LUMPECTOMY WITH RADIOACTIVE SEED AND SENTINEL LYMPH NODE BIOPSY Right 01/31/2020   Procedure: RIGHT BREAST LUMPECTOMY WITH RADIOACTIVE SEED AND SENTINEL LYMPH NODE BIOPSY;  Surgeon: Theresa Ned, MD;  Location: [facility];  Service: General;  Laterality: Right;  PEC BLOCK   BREAST SURGERY  2009   X 2, cancer stage 0, lumpectomy   CESAREAN SECTION     1979, 1982   CHOLECYSTECTOMY     COLONOSCOPY     DILATATION & CURETTAGE/HYSTEROSCOPY WITH MYOSURE  11/2015   HERNIA REPAIR  2009   JOINT REPLACEMENT  2008   hip   MASTECTOMY Right 2021   OPEN SURGICAL REPAIR OF GLUTEAL TENDON Left 07/18/2019   Procedure: Left hip bearing surface revision with gluteal tendon repair;  Surgeon: Melodi Lerner, MD;  Location: [facility];  Service: Orthopedics;  Laterality: Left;  2 hrs   PORTACATH PLACEMENT N/A 03/15/2020   Procedure: INSERTION PORT-A-CATH WITH ULTRASOUND GUIDANCE;  Surgeon: Theresa Ned, MD;  Location: [facility];  Service: General;  Laterality: N/A;   SIMPLE MASTECTOMY WITH AXILLARY SENTINEL NODE BIOPSY Right 03/15/2020   Procedure: RIGHT SIMPLE MASTECTOMY;  Surgeon: Theresa Ned, MD;  Location: [facility];  Service: General;  Laterality: Right;    FAMILY HISTORY: Family History  Problem Relation Age of Onset   Skin cancer Mother    Hyperlipidemia Father    Dementia Father    Skin cancer Father    Heart attack Father    Diabetes Brother     Hypertension Brother    Breast cancer Maternal Aunt 82   Breast cancer Maternal Aunt        dx. mid-70s   Cancer Paternal Aunt        unknown type, dx. >50   Lung cancer Paternal Uncle    Multiple myeloma Paternal Uncle    Brain cancer Cousin        dx. 19s, paternal cousin   Arthritis Other    Hypertension Other    Cancer Other        2 aunts   Colon cancer Neg Hx    Esophageal cancer Neg Hx    Pancreatic cancer Neg Hx    Stomach cancer Neg Hx   The patient's father is 70 years old and the patient's mother is 74 years old as of February 2021.  The patient has one brother, no sisters.  A maternal aunt was diagnosed with breast cancer at age 53.  A paternal uncle was diagnosed with lung cancer in his 40s and another paternal uncle with a blood cancer in his late 43s.  There is no history of ovarian pancreatic or prostate cancer in the family to her knowledge.   GYNECOLOGIC  HISTORY:  No LMP recorded. Patient is postmenopausal. Menarche: 74 years old Age at first live birth: 74 years old GX P 2 LMP HRT no  Hysterectomy?  No BSO?  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  Her husband Lemond runs an environmental company that for example checks for mold and then remediates.  Son Charlie, 31 years old, lives in [place] and works as a product/process development scientist.  He has 2 sons.  The patient's son Adina 48 lives in [place] and works in data processing manager.  He married October 2021.  The patient is Ep[employer]alian    ADVANCED DIRECTIVES: In the absence of any documentation to the contrary, the patient's spouse is their HCPOA.    HEALTH MAINTENANCE: Social History   Tobacco Use   Smoking status: Never   Smokeless tobacco: Never   Tobacco comments:    father smoked when she was younger   Vaping Use   Vaping status: Never Used  Substance Use Topics   Alcohol use: Yes    Comment:  occasionally    Drug use: No     Colonoscopy: 03/2009/[place]  PAP: 10/2013, negative  Bone density: 11/2015, T score -1.7   Allergies  Allergen Reactions   Codeine Sulfate Nausea And Vomiting   Erythromycin Base Other (See Comments)    Stomach ache   Esomeprazole Magnesium  Other (See Comments)    GI upset   Pantoprazole  Diarrhea    Current Outpatient Medications  Medication Sig Dispense Refill   ALPRAZolam  (XANAX ) 0.25 MG tablet TAKE 1/2 TABLET BY MOUTH AT BEDTIME AS NEEDED FOR INSOMNIA. (Patient taking differently: Take 0.125 mg by mouth at bedtime.) 45 tablet 1   amLODipine  (NORVASC ) 5 MG tablet Take 1 tablet (5 mg total) by mouth daily. 90 tablet 3   Calcium Carb-Cholecalciferol (CALCIUM + D3 PO) Take 1 tablet by mouth daily with breakfast.     citalopram  (CELEXA ) 20 MG tablet TAKE 1 TABLET BY MOUTH EVERY DAY 90 tablet 1   doxycycline  (VIBRA -TABS) 100 MG tablet Take 1 tablet (100 mg total) by mouth every 12 (twelve) hours for 11 doses. 11 tablet 0   EPIPEN  2-PAK 0.3 MG/0.3ML SOAJ injection Inject 0.3 mg into the muscle as needed for anaphylaxis.      fluticasone  (FLONASE ) 50 MCG/ACT nasal spray Place 2 sprays into both nostrils 2 (two) times daily. (Patient taking differently: Place 2 sprays into both nostrils in the morning.) 16 g 6   fluticasone  furoate-vilanterol (BREO ELLIPTA ) 100-25 MCG/ACT AEPB Inhale 1 puff into the lungs daily.     gabapentin  (NEURONTIN ) 100 MG capsule Take 1 capsule (100 mg total) by mouth 2 (two) times daily. In morning and afternoon. Ok to take in addition with gabapentin  300 mg nightly (Patient taking differently: Take 100 mg by mouth See admin instructions. Take 100 mg by mouth in the morning and afternoon) 60 capsule 2   gabapentin  (NEURONTIN ) 300 MG capsule TAKE 1 CAPSULE BY MOUTH EVERYDAY AT BEDTIME (Patient taking differently: Take 300 mg by mouth at bedtime.) 90 capsule 4   hydrochlorothiazide  (MICROZIDE ) 12.5 MG capsule Take 1 capsule (12.5 mg  total) by mouth daily. 90 capsule 3   levalbuterol (XOPENEX HFA) 45 MCG/ACT inhaler Inhale 2 puffs into the lungs every 6 (six) hours as needed for shortness of breath or wheezing.     levothyroxine  (SYNTHROID ) 50 MCG tablet Take 1 tablet (50 mcg  total) by mouth daily. (Patient taking differently: Take 50 mcg by mouth at bedtime.) 90 tablet 3   meloxicam  (MOBIC ) 15 MG tablet TAKE 1 TABLET BY MOUTH EVERY DAY 90 tablet 0   pantoprazole  (PROTONIX ) 40 MG tablet Take 1 tablet (40 mg total) by mouth 2 (two) times daily before a meal. (Patient not taking: Reported on 12/15/2024) 60 tablet 3   polyethylene glycol (MIRALAX  / GLYCOLAX ) 17 g packet Take 17 g by mouth every other day.     potassium chloride  (KLOR-CON  10) 10 MEQ tablet Take two tablets once daily (Patient taking differently: Take 10 mEq by mouth 2 (two) times daily.) 60 tablet 5   predniSONE  (DELTASONE ) 10 MG tablet Take 4 tablets (40 mg total) by mouth daily with breakfast for 3 days, THEN 3 tablets (30 mg total) daily with breakfast for 2 days, THEN 2 tablets (20 mg total) daily with breakfast for 2 days. 22 tablet 0   sodium chloride  (OCEAN) 0.65 % SOLN nasal spray Place 1 spray into both nostrils as needed for congestion.     Spacer/Aero-Holding Chambers DEVI Use spacer for your albuterol  and breo as prescribed 1 each 1   tamoxifen  (NOLVADEX ) 20 MG tablet TAKE 1 TABLET BY MOUTH EVERY DAY 90 tablet 3   No current facility-administered medications for this visit.    OBJECTIVE: White woman in no acute distress  There were no vitals filed for this visit.    There is no height or weight on file to calculate BMI.   Wt Readings from Last 3 Encounters:  11/28/24 142 lb (64.4 kg)  11/25/24 142 lb 14.4 oz (64.8 kg)  10/27/24 145 lb (65.8 kg)     ECOG FS:1 - Symptomatic but completely ambulatory  Physical Exam Constitutional:      General: She is not in acute distress.    Appearance: Normal appearance.  Cardiovascular:     Rate and Rhythm:  Normal rate and regular rhythm.     Pulses: Normal pulses.     Heart sounds: Normal heart sounds.  Chest:     Comments: Right mastectomy scar with no recurrence. Left breast normal to inspection and palpation. No regional adenopathy Musculoskeletal:     Cervical back: Normal range of motion and neck supple. No rigidity.  Lymphadenopathy:     Cervical: No cervical adenopathy.  Skin:    General: Skin is warm and dry.  Neurological:     Mental Status: She is alert.      LAB RESULTS:  CMP     Component Value Date/Time   NA 134 (L) 12/16/2024 0645   K 3.5 12/16/2024 0645   CL 97 (L) 12/16/2024 0645   CO2 28 12/16/2024 0645   GLUCOSE 117 (H) 12/16/2024 0645   BUN 13 12/16/2024 0645   CREATININE 0.95 12/16/2024 0645   CREATININE 1.05 (H) 05/23/2024 1222   CALCIUM 9.4 12/16/2024 0645   PROT 7.4 05/23/2024 1222   ALBUMIN 4.0 05/23/2024 1222   AST 12 (L) 05/23/2024 1222   ALT 7 05/23/2024 1222   ALKPHOS 33 (L) 05/23/2024 1222   BILITOT 0.4 05/23/2024 1222   GFRNONAA >60 12/16/2024 0645   GFRNONAA 56 (L) 05/23/2024 1222   GFRAA >60 08/14/2020 1227   GFRAA >60 01/11/2020 1209    No results found for: TOTALPROTELP, ALBUMINELP, A1GS, A2GS, BETS, BETA2SER, GAMS, MSPIKE, SPEI  Lab Results  Component Value Date   WBC 16.6 (H) 12/16/2024   NEUTROABS 3.3 12/14/2024   HGB 11.2 (L) 12/16/2024  HCT 33.2 (L) 12/16/2024   MCV 88.5 12/16/2024   PLT 301 12/16/2024    No results found for: LABCA2  No components found for: [id number]  No results for input(s): INR in the last 168 hours.  No results found for: LABCA2  No results found for: RJW800  No results found for: CAN125  No results found for: CAN153  No results found for: CA2729  No components found for: HGQUANT  No results found for: CEA1, CEA / No results found for: CEA1, CEA   No results found for: AFPTUMOR  No results found for: CHROMOGRNA  No results found  for: KPAFRELGTCHN, LAMBDASER, KAPLAMBRATIO (kappa/lambda light chains)  No results found for: HGBA, HGBA2QUANT, HGBFQUANT, HGBSQUAN (Hemoglobinopathy evaluation)   No results found for: LDH  No results found for: IRON, TIBC, IRONPCTSAT (Iron and TIBC)  No results found for: FERRITIN  Urinalysis    Component Value Date/Time   COLORURINE yellow 05/24/2010 0855   APPEARANCEUR Clear 05/24/2010 0855   LABSPEC 1.015 05/24/2010 0855   PHURINE 6.0 05/24/2010 0855   GLUCOSEU NEGATIVE 09/22/2007 1110   HGBUR negative 05/24/2010 0855   BILIRUBINUR n 06/29/2019 1533   KETONESUR NEGATIVE 09/22/2007 1110   PROTEINUR Negative 06/29/2019 1533   PROTEINUR NEGATIVE 09/22/2007 1110   UROBILINOGEN 0.2 06/29/2019 1533   UROBILINOGEN 0.2 05/24/2010 0855   NITRITE n 06/29/2019 1533   NITRITE negative 05/24/2010 0855   LEUKOCYTESUR Negative 06/29/2019 1533    STUDIES: CT Angio Chest PE W and/or Wo Contrast Result Date: 12/14/2024 CLINICAL DATA:  Hypoxia short of breath EXAM: CT ANGIOGRAPHY CHEST WITH CONTRAST TECHNIQUE: Multidetector CT imaging of the chest was performed using the standard protocol during bolus administration of intravenous contrast. Multiplanar CT image reconstructions and MIPs were obtained to evaluate the vascular anatomy. RADIATION DOSE REDUCTION: This exam was performed according to the departmental dose-optimization program which includes automated exposure control, adjustment of the mA and/or kV according to patient size and/or use of iterative reconstruction technique. CONTRAST:  OMNIPAQUE  IOHEXOL  350 MG/ML SOLN COMPARISON:  Chest x-ray 12/14/2024, chest CT 12/12/2024 FINDINGS: Cardiovascular: Satisfactory opacification of the pulmonary arteries to the segmental level. No evidence of pulmonary embolism. Nonaneurysmal aorta. No dissection. Mild atherosclerosis. Normal cardiac size. No pericardial effusion. Mild coronary vascular calcification  Mediastinum/Nodes: Patent trachea. No thyroid  mass. No suspicious lymph nodes. Esophagus within normal limits. Lungs/Pleura: No acute airspace disease, pleural effusion or pneumothorax. Minimal mucous plugging of nondilated upper lobe and lower lobe bronchi. Multiple scattered pulmonary nodules, for example 5 mm lingular nodule on series 6, image 85. Right middle lobe 6 mm pulmonary nodule on series 6, image 63. Anterior right lung base 4 mm pulmonary nodule on series 6, image 93. Vague right lower lobe 6 mm nodular focus on series 6, image 74. Upper Abdomen: No acute finding.  10 mm gastrohepatic node. Musculoskeletal: Right mastectomy.  No acute osseous abnormality Review of the MIP images confirms the above findings. IMPRESSION: 1. Negative for acute pulmonary embolus or aortic dissection. 2. Minimal mucous plugging of nondilated upper lobe and lower lobe bronchi potentially due to airways inflammatory process. No acute airspace disease. 3. Multiple scattered pulmonary nodules measuring up to 6 mm. Follow-up according to oncologic protocol recommended given history of breast cancer 4. Aortic atherosclerosis. Aortic Atherosclerosis (ICD10-I70.0). Electronically Signed   By: Luke Bun M.D.   On: 12/14/2024 18:00   CT Chest Wo Contrast Result Date: 12/14/2024 CLINICAL DATA:  Chronic cough. EXAM: CT CHEST WITHOUT CONTRAST TECHNIQUE: Multidetector CT  imaging of the chest was performed following the standard protocol without IV contrast. RADIATION DOSE REDUCTION: This exam was performed according to the departmental dose-optimization program which includes automated exposure control, adjustment of the mA and/or kV according to patient size and/or use of iterative reconstruction technique. COMPARISON:  None Available. FINDINGS: Cardiovascular: Atherosclerotic calcification of the aorta, aortic valve and coronary arteries. Enlarged pulmonic trunk and heart. No pericardial effusion. Mediastinum/Nodes: Thoracic  inlet lymph nodes are not enlarged by CT size criteria. No pathologically enlarged mediastinal or axillary lymph nodes. Hilar regions are difficult to definitively evaluate without IV contrast. Right mastectomy. Air in the esophagus can be seen with dysmotility. Lungs/Pleura: Subpleural radiation scarring in the anterior right lung. High density subpleural nodule along the minor fissure, indicative of a granuloma. A few scattered pulmonary nodules measure up to 6 mm in the right lower lobe (3/94). No pleural fluid. Airway is unremarkable. Upper Abdomen: Cholecystectomy. 10 mm gastrohepatic ligament lymph node. Visualized portions of the liver, adrenal glands, kidneys, spleen, pancreas, stomach and bowel are otherwise grossly unremarkable. No upper abdominal adenopathy. Musculoskeletal: Degenerative changes in the spine. IMPRESSION: 1. No pulmonary parenchymal findings to explain the patient's cough. 2. Scattered pulmonary nodules measure 6 mm or less in size. Although not particularly concerning, given breast cancer history, follow-up according to oncologic protocols is recommended. 3. Aortic atherosclerosis (ICD10-I70.0). Coronary artery calcification. 4. Enlarged pulmonic trunk, indicative of pulmonary arterial hypertension. Electronically Signed   By: Newell Eke M.D.   On: 12/14/2024 14:06   DG Chest Portable 1 View Result Date: 12/14/2024 CLINICAL DATA:  Cough. EXAM: PORTABLE CHEST 1 VIEW COMPARISON:  Apr 12, 2021 FINDINGS: A right-sided venous Port-A-Cath seen on the prior study has been removed. The heart size and mediastinal contours are within normal limits. No acute infiltrate, pleural effusion or pneumothorax is identified. Radiopaque surgical clips are seen overlying the right upper quadrant. The visualized skeletal structures are unremarkable. IMPRESSION: No active disease. Electronically Signed   By: Suzen Dials M.D.   On: 12/14/2024 12:49      ELIGIBLE FOR AVAILABLE RESEARCH  PROTOCOL: no  ASSESSMENT: 74 y.o. [place] woman status post right breast periareolar biopsy 12/30/2019 for a clinical T1a N0, stage I invasive ductal carcinoma, grade 2, estrogen and progesterone receptor positive, HER-2 amplified, with an MIB-one of 20%.  (1) history of prior right lumpectomy June 2009 for ductal carcinoma in situ  (a) status post adjuvant radiation  (b) did not receive antiestrogens  She then had right breast melanoma in 2020, resected, didn't need adjuvant immunotherapy.  (2) status post repeat right lumpectomy and sentinel lymph node sampling 01/31/2020 for a pT1c pN0, stage IA invasive ductal carcinoma, grade 2, with positive margins  (a) total one sentinel node removed  (3) s/p right mastectomy 03/15/2020 showing residual invasive ductal carcinoma measuring 0.9 cm, but negative margins (added to prior 1.1 lumpectomy, still T1 N0 or stage IA)  (a) one additional axillary node was removed (total 2 right axillary nodes removed)  (3) adjuvant chemotherapy with paclitaxel  and trastuzumab  weekly x 12 started 04/24/2020, completing 10 doses 06/26/2020   (a) final 2 doses omitted secondary to peripheral neuropathy  (4) trastuzumab  to be continued every 21 days to total one year (through May 2022).  (a) echo 01/17/2020 shows an ejection fraction in the 60-65% range  (b) echo 05/30/2020 shows an ejection fraction of 60-65%.  (c) echo 08/28/2020 shows an ejection fraction in the 55-60% range  (d) echo 12/14/2020 shows an ejection fraction  in the 60-65% range.  (e) echo 03/26/2021 shows an EF in the 55% range with decreased GLS  (f) repeat echo 07/26/2021 shows the left ventricular ejection fraction back to 60-65%  (5) started tamoxifen  07/31/2020  (a) Bone density in 11/2015 shows osteopenia with T score of -1.7.  (b) bone density 03/04/2021 shows a T score of -2.0  (6) genetics testing 02/01/2020 through the [facility] STAT panel found no deleterious mutations  then ATM, BRCA1, BRCA2, CDH1, CHEK2, PALB2, PTEN, STK11 and TP53.    Assessment and Plan Assessment & Plan Tamoxifen  therapy Continues tamoxifen  with good tolerance. Mammogram satisfactory. Scheduled to complete therapy in September 2026. Discussed cessation issues; no evidence supports tapering. Agreed to monitor post-cessation. - Continue tamoxifen  therapy until September 2026. - Monitor for issues upon cessation.  Gabapentin  therapy Gabapentin  used for hot flashes and sleep. Discussed tapering if discontinuation desired. Prefers minimal medication but acknowledges benefits for sleep. - Continue gabapentin  therapy. - Plan gradual taper if discontinuation desired.  Chronic cold symptoms Persistent symptoms since December. Previous antibiotics ineffective. ENT referral for further evaluation,  White spots on gums White spots noted by dentist, unchanged over two weeks. ENT evaluation suggested for comprehensive oral examination. - Refer to ENT for evaluation of white spots on gums.  Weight loss Weight loss from 154 lbs to 142 lbs over 2 yrs. Reports decreased appetite. We will continue to monitor.  General Health Maintenance Mammogram satisfactory. Scheduled to complete tamoxifen  therapy in September 2026.  Follow-up Follow-up plans discussed for ongoing management and monitoring. - Order CMP today to assess liver function. - Schedule follow-up appointment in one year.  RTC with us  in 1 yr.  Total encounter time 30  minutes.*   *Total Encounter Time as defined by the Centers for Medicare and Medicaid Services includes, in addition to the face-to-face time of a patient visit (documented in the note above) non-face-to-face time: obtaining and reviewing outside history, ordering and reviewing medications, tests or procedures, care coordination (communications with other health care professionals or caregivers) and documentation in the medical record. "

## 2024-12-22 ENCOUNTER — Ambulatory Visit (INDEPENDENT_AMBULATORY_CARE_PROVIDER_SITE_OTHER)

## 2024-12-22 ENCOUNTER — Ambulatory Visit (HOSPITAL_BASED_OUTPATIENT_CLINIC_OR_DEPARTMENT_OTHER): Admitting: Pulmonary Disease

## 2024-12-22 ENCOUNTER — Encounter (HOSPITAL_BASED_OUTPATIENT_CLINIC_OR_DEPARTMENT_OTHER): Payer: Self-pay | Admitting: Pulmonary Disease

## 2024-12-22 VITALS — BP 113/68 | HR 62 | Ht 64.0 in | Wt 142.0 lb

## 2024-12-22 DIAGNOSIS — J4550 Severe persistent asthma, uncomplicated: Secondary | ICD-10-CM | POA: Diagnosis not present

## 2024-12-22 DIAGNOSIS — R053 Chronic cough: Secondary | ICD-10-CM | POA: Diagnosis not present

## 2024-12-22 DIAGNOSIS — E039 Hypothyroidism, unspecified: Secondary | ICD-10-CM

## 2024-12-22 DIAGNOSIS — I10 Essential (primary) hypertension: Secondary | ICD-10-CM

## 2024-12-22 DIAGNOSIS — R918 Other nonspecific abnormal finding of lung field: Secondary | ICD-10-CM | POA: Diagnosis not present

## 2024-12-22 MED ORDER — FLUTICASONE FUROATE-VILANTEROL 200-25 MCG/ACT IN AEPB: 1.0000 | INHALATION_SPRAY | Freq: Every day | RESPIRATORY_TRACT | 5 refills | Status: AC

## 2024-12-22 NOTE — Progress Notes (Signed)
 Full PFT performed today.

## 2024-12-22 NOTE — Patient Instructions (Signed)
 Full PFT performed today.

## 2024-12-22 NOTE — Assessment & Plan Note (Addendum)
--  Reviewed  function tests which is consistent with asthma --Reviewed CT Chest without contrast with mild mucous plugging --STOP gabapentin  --Continue flonase  twice a day --Discontinued PPI due to diarrhea

## 2024-12-22 NOTE — Patient Instructions (Addendum)
 Chronic cough --Reviewed  function tests which is consistent with asthma --Reviewed CT Chest without contrast with mild mucous plugging and asthma like changes --STOP gabapentin  --Continue flonase  twice a day --Discontinued PPI due to diarrhea  Severe persistent asthma without complication  --Enroll in Nucala --INCREASE Breo 200 mcg ONE puff ONCE a day  Multiple lung nodules --Oncology ordered July 2026

## 2024-12-22 NOTE — Progress Notes (Signed)
 "   Subjective:   PATIENT ID: Theresa Bradley GENDER: female DOB: April 24, 1951, MRN: [id number]  Chief Complaint  Patient presents with   Follow-up    Cough, f/u PFTs    Reason for Visit: Follow-up for cough     Theresa Bradley is a 74 y.o. female never smoker with HTN, hypothyroidism, osteoarthritis who presents for cough.  Initial consult 11/25/24 She has experienced a chronic cough since December 2024, which is sometimes productive. The cough was previously evaluated in 2015 by a pulmonologist and was thought to be related to allergic rhinitis and GERD. At that time, she stopped lisinopril and had a trial of PPI, with normal PFTs. She has tried various treatments including albuterol , Breo, Allegra, Flonase , cetirizine, and Claritin , but did not find them effective. She has been on Breo once daily for about two years and has used an emergency inhaler occasionally for severe coughing fits.  She has been seeing a speech therapist since August 2025 and reports improved voice quality. However, her voice remains affected, especially when she is sick, and she has been experiencing frequent illnesses with increased phlegm and coughing up green sputum. She was sick in January, May, late September, and again recently. She has also been seeing an ENT and had a nerve block about four weeks ago, which did not help. She is scheduled for a second attempt.  She has been on gabapentin  for hot flashes and sleep, taking it once at night, but it has not been effective for her cough. She has also been taking famotidine  for several years. Allergy shots were not helpful and have been stopped temporarily. She has a history of high eosinophil count, which was noted to be in the 800 range.  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 She has had to leave meetings and feels self-conscious about coughing in public settings.  Social History: Never smoker      12/22/2024 Discussed the use of AI scribe software for clinical note transcription with the patient, who gave verbal consent to proceed.  History of Present Illness Since our last visit she was hospitalized from 12/14/24-12/16/24. Pulmonary consulted for possible asthma and treating with prednisone , inhaler and doxycycline . Discharged with symbicort but patient has not tolerated in the past. Advised for her to continue Breo and remaining discharge meds. Today post hospital discharge she reports doing well. Able to hold Breo for the last two days without significant respiratory symptoms.     Past Medical History:  Diagnosis Date   Anxiety    Breast cancer (HCC) 2009   right   Breast cancer (HCC) 2021   Cancer (HCC)    COUGH, CHRONIC 05/10/2010   Dysrhythmia    occationally skips a beat   Family history of brain cancer    Family history of breast cancer    Family history of lung cancer    Family history of multiple myeloma    Family history of skin cancer    GERD (gastroesophageal reflux disease)    HYPERTENSION 04/16/2009   HYPOTHYROIDISM 04/16/2009   OSTEOARTHRITIS 04/16/2009   Personal history of chemotherapy 2021   Personal history of radiation therapy 2009   SENILE LENTIGO 06/10/2010     Family History  Problem Relation Age of Onset   Skin cancer Mother    Hyperlipidemia Father    Dementia Father    Skin cancer Father    Heart attack Father    Diabetes Brother  Hypertension Brother    Breast cancer Maternal Aunt 12   Breast cancer Maternal Aunt        dx. mid-70s   Cancer Paternal Aunt        unknown type, dx. >50   Lung cancer Paternal Uncle    Multiple myeloma Paternal Uncle    Brain cancer Cousin        dx. 36s, paternal cousin   Arthritis Other    Hypertension Other    Cancer Other        2 aunts   Colon cancer Neg Hx    Esophageal cancer Neg Hx    Pancreatic cancer Neg Hx    Stomach cancer Neg Hx      Social History   Occupational History    Occupation: Veterinary Surgeon    Comment: Environmental Education Officer   Occupation: Wellsite Geologist: [employer]    Comment: Retired  Tobacco Use   Smoking status: Never   Smokeless tobacco: Never   Tobacco comments:    father smoked when she was younger   Advertising Account Planner   Vaping status: Never Used  Substance and Sexual Activity   Alcohol use: Yes    Comment: occasionally    Drug use: No   Sexual activity: Not on file    Allergies[1]   Outpatient Medications Prior to Visit  Medication Sig Dispense Refill   amLODipine  (NORVASC ) 5 MG tablet Take 1 tablet (5 mg total) by mouth daily. 90 tablet 3   Calcium Carb-Cholecalciferol (CALCIUM + D3 PO) Take 1 tablet by mouth daily with breakfast.     citalopram  (CELEXA ) 20 MG tablet TAKE 1 TABLET BY MOUTH EVERY DAY 90 tablet 1   EPIPEN  2-PAK 0.3 MG/0.3ML SOAJ injection Inject 0.3 mg into the muscle as needed for anaphylaxis.      hydrochlorothiazide  (MICROZIDE ) 12.5 MG capsule Take 1 capsule (12.5 mg total) by mouth daily. 90 capsule 3   levalbuterol (XOPENEX HFA) 45 MCG/ACT inhaler Inhale 2 puffs into the lungs every 6 (six) hours as needed for shortness of breath or wheezing.     levothyroxine  (SYNTHROID ) 50 MCG tablet Take 1 tablet (50 mcg total) by mouth daily. (Patient taking differently: Take 50 mcg by mouth at bedtime.) 90 tablet 3   meloxicam  (MOBIC ) 15 MG tablet TAKE 1 TABLET BY MOUTH EVERY DAY 90 tablet 0   polyethylene glycol (MIRALAX  / GLYCOLAX ) 17 g packet Take 17 g by mouth every other day.     potassium chloride  (KLOR-CON  10) 10 MEQ tablet Take two tablets once daily (Patient taking differently: Take 10 mEq by mouth 2 (two) times daily.) 60 tablet 5   sodium chloride  (OCEAN) 0.65 % SOLN nasal spray Place 1 spray into both nostrils as needed for congestion.     Spacer/Aero-Holding Chambers DEVI Use spacer for your albuterol  and breo as prescribed 1 each 1   tamoxifen  (NOLVADEX ) 20 MG tablet TAKE 1 TABLET BY MOUTH EVERY DAY 90 tablet 3    doxycycline  (VIBRA -TABS) 100 MG tablet Take 1 tablet (100 mg total) by mouth every 12 (twelve) hours for 11 doses. 11 tablet 0   fluticasone  furoate-vilanterol (BREO ELLIPTA ) 100-25 MCG/ACT AEPB Inhale 1 puff into the lungs daily.     pantoprazole  (PROTONIX ) 40 MG tablet Take 1 tablet (40 mg total) by mouth 2 (two) times daily before a meal. 60 tablet 3   predniSONE  (DELTASONE ) 10 MG tablet Take 4 tablets (40 mg total) by mouth daily with breakfast for  3 days, THEN 3 tablets (30 mg total) daily with breakfast for 2 days, THEN 2 tablets (20 mg total) daily with breakfast for 2 days. 22 tablet 0   ALPRAZolam  (XANAX ) 0.25 MG tablet TAKE 1/2 TABLET BY MOUTH AT BEDTIME AS NEEDED FOR INSOMNIA. (Patient taking differently: Take 0.125 mg by mouth at bedtime.) 45 tablet 1   fluticasone  (FLONASE ) 50 MCG/ACT nasal spray Place 2 sprays into both nostrils 2 (two) times daily. (Patient taking differently: Place 2 sprays into both nostrils in the morning.) 16 g 6   gabapentin  (NEURONTIN ) 100 MG capsule Take 1 capsule (100 mg total) by mouth 2 (two) times daily. In morning and afternoon. Ok to take in addition with gabapentin  300 mg nightly (Patient taking differently: Take 100 mg by mouth See admin instructions. Take 100 mg by mouth in the morning and afternoon) 60 capsule 2   gabapentin  (NEURONTIN ) 300 MG capsule TAKE 1 CAPSULE BY MOUTH EVERYDAY AT BEDTIME (Patient taking differently: Take 300 mg by mouth at bedtime.) 90 capsule 4   No facility-administered medications prior to visit.    Review of Systems  Constitutional:  Negative for chills, diaphoresis, fever, malaise/fatigue and weight loss.  HENT:  Positive for congestion.   Respiratory:  Positive for cough. Negative for hemoptysis, sputum production, shortness of breath and wheezing.   Cardiovascular:  Negative for chest pain, palpitations and leg swelling.     Objective:   Vitals:   12/22/24 1340  BP: 113/68  Pulse: 62  SpO2: 98%  Weight: 142 lb  (64.4 kg)  Height: 5' 4 (1.626 m)    SpO2: 98 %  Physical Exam Physical Exam: General: Well-appearing, no acute distress HENT: [place], AT Eyes: EOMI, no scleral icterus Respiratory: Clear to auscultation bilaterally.  No crackles, wheezing or rales Cardiovascular: RRR, -M/R/G, no JVD Extremities:-Edema,-tenderness Neuro: AAO x4, CNII-XII grossly intact Psych: Normal mood, normal affect    Data Reviewed:  Imaging: CXR 04/12/21 - no acute infiltrate effusion or edema CT Chest 12/12/24 - subpleural radiation scarring in anterior right lung, granuloma in minor fissure, scattered pulmonary nodules in RLL up to 6mm  CTA 12/14/24 - neg for PE, minimal mucous plugging in upper and lower lobes, unchanged subcentimeter pulmonary nodules  PFT: 06/30/14 FVC 2.60 (85%) FEV1 2.13 (91%) Ratio 82  TLC 94% DLCO 104% Interpretation: Normal pulmonary function tests  12/22/24 FVC 2.36 (87%) FEV1 1.85 (91%) Ratio 78  TLC 95% DLCO 115% Interpretation: Normal pulmonary function tests. Borderline significant bronchodilator response   Labs: CBC    Component Value Date/Time   WBC 16.6 (H) 12/16/2024 0645   RBC 3.75 (L) 12/16/2024 0645   HGB 11.2 (L) 12/16/2024 0645   HGB 12.5 05/23/2024 1222   HCT 33.2 (L) 12/16/2024 0645   PLT 301 12/16/2024 0645   PLT 304 05/23/2024 1222   MCV 88.5 12/16/2024 0645   MCH 29.9 12/16/2024 0645   MCHC 33.7 12/16/2024 0645   RDW 12.9 12/16/2024 0645   LYMPHSABS 3.8 12/14/2024 1223   MONOABS 0.6 12/14/2024 1223   EOSABS 1.3 (H) 12/14/2024 1223   BASOSABS 0.1 12/14/2024 1223   Absolute eos  05/23/24 - 800 12/14/24 - 1300    Assessment & Plan:   Discussion: 74 y.o. female never smoker with HTN, hypothyroidism, osteoarthritis who presents for cough >10 years. Frequent coughing spells >4-5x this year exacerbating her chronic cough. S/p nerve block with ENT last month. Persistent cough in absence of illness. Steroids help but difficulty tolerating side effects. On  low dose gabapentin  with unchanged cough. Labs noted to have peripheral eosinophilia so she may have a cough variant asthma that may benefit from biologic therapy  PFTs reviewed today with borderline bronchodilator response. 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 Discussed biologic agents including Xolair (anti-IgE), Nucala (anti-IL-5), Fasenra (anti-IL-5 receptor alpha) and Dupixent (anti-IL-4 receptor subunit alpha). I believe patient would benefit from biologic agent given uncontrolled symptoms, multiple exacerbations and eosinophilia. We discussed the risks and benefits of these type of medications including anaphylaxis. Patient expressed understanding and would like to pursue treatment if eligible.    Assessment & Plan Chronic cough --Reviewed  function tests which is consistent with asthma --Reviewed CT Chest without contrast with mild mucous plugging --STOP gabapentin  --Continue flonase  twice a day --Discontinued PPI due to diarrhea Severe persistent asthma without complication (HCC) --Enroll in Nucala --INCREASE Breo 200 mcg ONE puff ONCE a day Multiple lung nodules --Oncology ordered July 2026  Health Maintenance Immunization History  Administered Date(s) Administered   Fluad Quad(high Dose 65+) 08/15/2019, 09/20/2020, 09/03/2022   Fluad Trivalent(High Dose 65+) 09/15/2023   INFLUENZA, HIGH DOSE SEASONAL PF 10/03/2015, 08/14/2016, 12/12/2016, 09/01/2017, 09/06/2018, 01/17/2022, 09/07/2024   Influenza Split 09/08/2011, 08/20/2012, 09/26/2013   Influenza Whole 09/11/2010   Influenza,inj,Quad PF,6+ Mos 09/01/2014   Influenza,inj,quad, With Preservative 07/27/2017, 08/24/2018   Influenza-Unspecified 09/02/2021   PFIZER(Purple Top)SARS-COV-2 Vaccination 12/18/2019, 01/09/2020, 05/25/2020, 01/17/2022   Pneumococcal Conjugate-13 09/29/2014   Pneumococcal  Polysaccharide-23 11/24/2006, 11/06/2015, 12/12/2016, 02/23/2018, 01/17/2022   Td 11/25/1999, 06/10/2010   Zoster Recombinant(Shingrix) 06/05/2017, 10/24/2017   Zoster, Live 07/09/2010   CT Lung Screen - never smoker  No orders of the defined types were placed in this encounter.  Meds ordered this encounter  Medications   fluticasone  furoate-vilanterol (BREO ELLIPTA ) 200-25 MCG/ACT AEPB    Sig: Inhale 1 puff into the lungs daily.    Dispense:  60 each    Refill:  5    Return in about 3 months (around 03/22/2025).  I have spent a total time of 40-minutes on the day of the appointment including chart review, data review, collecting history, coordinating care and discussing medical diagnosis and plan with the patient/family. Past medical history, allergies, medications were reviewed. Pertinent imaging, labs and tests included in this note have been reviewed and interpreted independently by me.  Jamarrius Salay Slater Staff, MD [facility] 12/22/2024 2:39 PM         [1]  Allergies Allergen Reactions   Codeine Sulfate Nausea And Vomiting   Erythromycin Base Other (See Comments)    Stomach ache   Esomeprazole Magnesium  Other (See Comments)    GI upset   Pantoprazole  Diarrhea   "

## 2024-12-23 ENCOUNTER — Encounter (HOSPITAL_BASED_OUTPATIENT_CLINIC_OR_DEPARTMENT_OTHER): Payer: Self-pay | Admitting: Pulmonary Disease

## 2024-12-25 NOTE — Telephone Encounter (Signed)
 Please advise.

## 2024-12-26 ENCOUNTER — Telehealth: Payer: Self-pay

## 2024-12-26 LAB — PULMONARY FUNCTION TEST
DL/VA % pred: 155 %
DL/VA: 6.42 ml/min/mmHg/L
DLCO cor % pred: 125 %
DLCO cor: 23.11 ml/min/mmHg
DLCO unc % pred: 115 %
DLCO unc: 21.38 ml/min/mmHg
FEF 25-75 Post: 1.98 L/s
FEF 25-75 Pre: 1.65 L/s
FEF2575-%Change-Post: 19 %
FEF2575-%Pred-Post: 123 %
FEF2575-%Pred-Pre: 103 %
FEV1-%Change-Post: 7 %
FEV1-%Pred-Post: 91 %
FEV1-%Pred-Pre: 85 %
FEV1-Post: 1.85 L
FEV1-Pre: 1.73 L
FEV1FVC-%Change-Post: -3 %
FEV1FVC-%Pred-Pre: 108 %
FEV6-%Change-Post: 11 %
FEV6-%Pred-Post: 92 %
FEV6-%Pred-Pre: 83 %
FEV6-Post: 2.36 L
FEV6-Pre: 2.12 L
FEV6FVC-%Pred-Post: 105 %
FEV6FVC-%Pred-Pre: 105 %
FVC-%Change-Post: 11 %
FVC-%Pred-Post: 87 %
FVC-%Pred-Pre: 78 %
FVC-Post: 2.36 L
Post FEV1/FVC ratio: 78 %
Post FEV6/FVC ratio: 100 %
Pre FEV1/FVC ratio: 81 %
Pre FEV6/FVC Ratio: 100 %
RV % pred: 105 %
RV: 2.37 L
TLC % pred: 95 %
TLC: 4.69 L

## 2024-12-26 NOTE — Telephone Encounter (Signed)
 Received Nucala new start paperwork in onbase. Submitted a Prior Authorization request to [employer] for NUCALA via CoverMyMeds. Will update once we receive a response.  Key: [id number]

## 2024-12-27 ENCOUNTER — Inpatient Hospital Stay: Admitting: Family Medicine

## 2025-01-02 ENCOUNTER — Inpatient Hospital Stay: Admitting: Family Medicine

## 2025-01-30 ENCOUNTER — Ambulatory Visit (INDEPENDENT_AMBULATORY_CARE_PROVIDER_SITE_OTHER)

## 2025-05-23 ENCOUNTER — Inpatient Hospital Stay

## 2025-05-23 ENCOUNTER — Inpatient Hospital Stay: Admitting: Hematology and Oncology
